# Patient Record
Sex: Male | Born: 1935 | Race: White | Hispanic: No | Marital: Married | Smoking: Never smoker
Health system: Southern US, Community
[De-identification: ages and names within clinical notes are randomized; demographics above are authoritative.]

## PROBLEM LIST (undated history)

## (undated) DIAGNOSIS — I2510 Atherosclerotic heart disease of native coronary artery without angina pectoris: Secondary | ICD-10-CM

## (undated) DIAGNOSIS — N184 Chronic kidney disease, stage 4 (severe): Secondary | ICD-10-CM

## (undated) DIAGNOSIS — J9611 Chronic respiratory failure with hypoxia: Secondary | ICD-10-CM

## (undated) DIAGNOSIS — E039 Hypothyroidism, unspecified: Secondary | ICD-10-CM

## (undated) DIAGNOSIS — M1990 Unspecified osteoarthritis, unspecified site: Secondary | ICD-10-CM

## (undated) DIAGNOSIS — L409 Psoriasis, unspecified: Secondary | ICD-10-CM

## (undated) DIAGNOSIS — I739 Peripheral vascular disease, unspecified: Secondary | ICD-10-CM

## (undated) DIAGNOSIS — K219 Gastro-esophageal reflux disease without esophagitis: Secondary | ICD-10-CM

## (undated) DIAGNOSIS — R001 Bradycardia, unspecified: Secondary | ICD-10-CM

## (undated) DIAGNOSIS — I779 Disorder of arteries and arterioles, unspecified: Secondary | ICD-10-CM

## (undated) DIAGNOSIS — B029 Zoster without complications: Secondary | ICD-10-CM

## (undated) DIAGNOSIS — I4891 Unspecified atrial fibrillation: Secondary | ICD-10-CM

## (undated) DIAGNOSIS — R531 Weakness: Secondary | ICD-10-CM

## (undated) DIAGNOSIS — Z9981 Dependence on supplemental oxygen: Secondary | ICD-10-CM

## (undated) DIAGNOSIS — I5032 Chronic diastolic (congestive) heart failure: Secondary | ICD-10-CM

## (undated) DIAGNOSIS — R011 Cardiac murmur, unspecified: Secondary | ICD-10-CM

## (undated) DIAGNOSIS — R0601 Orthopnea: Secondary | ICD-10-CM

## (undated) DIAGNOSIS — F0390 Unspecified dementia without behavioral disturbance: Secondary | ICD-10-CM

## (undated) DIAGNOSIS — I10 Essential (primary) hypertension: Secondary | ICD-10-CM

## (undated) DIAGNOSIS — E785 Hyperlipidemia, unspecified: Secondary | ICD-10-CM

## (undated) DIAGNOSIS — E119 Type 2 diabetes mellitus without complications: Secondary | ICD-10-CM

## (undated) HISTORY — DX: Essential (primary) hypertension: I10

## (undated) HISTORY — DX: Atherosclerotic heart disease of native coronary artery without angina pectoris: I25.10

## (undated) HISTORY — DX: Type 2 diabetes mellitus without complications: E11.9

## (undated) HISTORY — DX: Unspecified osteoarthritis, unspecified site: M19.90

## (undated) HISTORY — PX: LUNG REMOVAL, PARTIAL: SHX233

## (undated) HISTORY — PX: CAROTID ENDARTERECTOMY: SUR193

## (undated) HISTORY — DX: Orthopnea: R06.01

## (undated) HISTORY — PX: BLEPHAROPLASTY: SUR158

## (undated) HISTORY — PX: CARDIAC CATHETERIZATION: SHX172

## (undated) HISTORY — DX: Psoriasis, unspecified: L40.9

## (undated) HISTORY — DX: Chronic kidney disease, stage 4 (severe): N18.4

## (undated) HISTORY — DX: Chronic diastolic (congestive) heart failure: I50.32

## (undated) HISTORY — DX: Chronic respiratory failure with hypoxia: J96.11

## (undated) HISTORY — DX: Unspecified atrial fibrillation: I48.91

## (undated) HISTORY — PX: SHOULDER SURGERY: SHX246

## (undated) HISTORY — PX: THORACOTOMY: SHX5074

## (undated) HISTORY — PX: CORONARY ARTERY BYPASS GRAFT: SHX141

## (undated) HISTORY — PX: APPENDECTOMY: SHX54

## (undated) HISTORY — DX: Bradycardia, unspecified: R00.1

## (undated) HISTORY — DX: Disorder of arteries and arterioles, unspecified: I77.9

## (undated) HISTORY — DX: Weakness: R53.1

## (undated) HISTORY — PX: FRACTURE SURGERY: SHX138

## (undated) HISTORY — DX: Hyperlipidemia, unspecified: E78.5

## (undated) HISTORY — DX: Hypothyroidism, unspecified: E03.9

## (undated) HISTORY — DX: Peripheral vascular disease, unspecified: I73.9

## (undated) HISTORY — DX: Zoster without complications: B02.9

---

## 1998-11-26 ENCOUNTER — Ambulatory Visit (HOSPITAL_COMMUNITY): Admission: RE | Admit: 1998-11-26 | Discharge: 1998-11-26 | Payer: Self-pay | Admitting: Vascular Surgery

## 1998-11-26 ENCOUNTER — Encounter: Payer: Self-pay | Admitting: Vascular Surgery

## 1998-11-30 ENCOUNTER — Encounter: Payer: Self-pay | Admitting: Vascular Surgery

## 1998-12-01 ENCOUNTER — Inpatient Hospital Stay: Admission: RE | Admit: 1998-12-01 | Discharge: 1998-12-02 | Payer: Self-pay | Admitting: Vascular Surgery

## 1999-11-22 ENCOUNTER — Encounter: Payer: Self-pay | Admitting: Gastroenterology

## 1999-11-22 ENCOUNTER — Encounter: Admission: RE | Admit: 1999-11-22 | Discharge: 1999-11-22 | Payer: Self-pay | Admitting: Gastroenterology

## 1999-12-05 ENCOUNTER — Ambulatory Visit (HOSPITAL_COMMUNITY): Admission: RE | Admit: 1999-12-05 | Discharge: 1999-12-05 | Payer: Self-pay | Admitting: Gastroenterology

## 1999-12-05 ENCOUNTER — Encounter: Payer: Self-pay | Admitting: Gastroenterology

## 1999-12-22 ENCOUNTER — Encounter: Payer: Self-pay | Admitting: Vascular Surgery

## 1999-12-23 ENCOUNTER — Encounter: Payer: Self-pay | Admitting: Vascular Surgery

## 1999-12-23 ENCOUNTER — Ambulatory Visit (HOSPITAL_COMMUNITY): Admission: RE | Admit: 1999-12-23 | Discharge: 1999-12-23 | Payer: Self-pay | Admitting: Vascular Surgery

## 1999-12-25 HISTORY — PX: LUMBAR LAMINECTOMY: SHX95

## 1999-12-30 ENCOUNTER — Inpatient Hospital Stay: Admission: RE | Admit: 1999-12-30 | Discharge: 1999-12-31 | Payer: Self-pay | Admitting: Vascular Surgery

## 2000-12-06 ENCOUNTER — Emergency Department (HOSPITAL_COMMUNITY): Admission: EM | Admit: 2000-12-06 | Discharge: 2000-12-06 | Payer: Self-pay | Admitting: Emergency Medicine

## 2001-01-09 ENCOUNTER — Inpatient Hospital Stay (HOSPITAL_COMMUNITY): Admission: EM | Admit: 2001-01-09 | Discharge: 2001-01-12 | Payer: Self-pay | Admitting: Emergency Medicine

## 2001-01-09 ENCOUNTER — Encounter: Payer: Self-pay | Admitting: Emergency Medicine

## 2001-01-25 ENCOUNTER — Encounter: Payer: Self-pay | Admitting: Cardiothoracic Surgery

## 2001-01-29 ENCOUNTER — Encounter: Payer: Self-pay | Admitting: Cardiothoracic Surgery

## 2001-01-29 ENCOUNTER — Encounter (INDEPENDENT_AMBULATORY_CARE_PROVIDER_SITE_OTHER): Payer: Self-pay | Admitting: *Deleted

## 2001-01-29 ENCOUNTER — Inpatient Hospital Stay (HOSPITAL_COMMUNITY): Admission: RE | Admit: 2001-01-29 | Discharge: 2001-02-04 | Payer: Self-pay | Admitting: Cardiothoracic Surgery

## 2001-01-30 ENCOUNTER — Encounter: Payer: Self-pay | Admitting: Cardiothoracic Surgery

## 2001-01-31 ENCOUNTER — Encounter: Payer: Self-pay | Admitting: Cardiothoracic Surgery

## 2001-02-01 ENCOUNTER — Encounter: Payer: Self-pay | Admitting: Cardiothoracic Surgery

## 2001-05-07 ENCOUNTER — Inpatient Hospital Stay (HOSPITAL_COMMUNITY): Admission: EM | Admit: 2001-05-07 | Discharge: 2001-05-08 | Payer: Self-pay

## 2001-05-07 ENCOUNTER — Ambulatory Visit (HOSPITAL_COMMUNITY): Admission: RE | Admit: 2001-05-07 | Discharge: 2001-05-07 | Payer: Self-pay | Admitting: Cardiology

## 2001-05-07 ENCOUNTER — Encounter: Payer: Self-pay | Admitting: Cardiology

## 2001-10-07 ENCOUNTER — Ambulatory Visit (HOSPITAL_COMMUNITY): Admission: RE | Admit: 2001-10-07 | Discharge: 2001-10-07 | Payer: Self-pay | Admitting: Gastroenterology

## 2002-12-08 ENCOUNTER — Encounter (HOSPITAL_BASED_OUTPATIENT_CLINIC_OR_DEPARTMENT_OTHER): Payer: Self-pay | Admitting: Internal Medicine

## 2002-12-08 ENCOUNTER — Encounter: Admission: RE | Admit: 2002-12-08 | Discharge: 2002-12-08 | Payer: Self-pay | Admitting: Internal Medicine

## 2002-12-14 ENCOUNTER — Encounter: Admission: RE | Admit: 2002-12-14 | Discharge: 2002-12-14 | Payer: Self-pay | Admitting: Internal Medicine

## 2002-12-14 ENCOUNTER — Encounter (HOSPITAL_BASED_OUTPATIENT_CLINIC_OR_DEPARTMENT_OTHER): Payer: Self-pay | Admitting: Internal Medicine

## 2004-11-10 ENCOUNTER — Ambulatory Visit (HOSPITAL_COMMUNITY): Admission: RE | Admit: 2004-11-10 | Discharge: 2004-11-10 | Payer: Self-pay | Admitting: Gastroenterology

## 2004-12-16 ENCOUNTER — Ambulatory Visit (HOSPITAL_COMMUNITY): Admission: RE | Admit: 2004-12-16 | Discharge: 2004-12-16 | Payer: Self-pay | Admitting: Cardiovascular Disease

## 2005-06-30 ENCOUNTER — Encounter: Admission: RE | Admit: 2005-06-30 | Discharge: 2005-06-30 | Payer: Self-pay | Admitting: Gastroenterology

## 2005-07-14 ENCOUNTER — Encounter: Admission: RE | Admit: 2005-07-14 | Discharge: 2005-07-14 | Payer: Self-pay | Admitting: Gastroenterology

## 2006-06-26 HISTORY — PX: CARDIAC CATHETERIZATION: SHX172

## 2006-09-12 ENCOUNTER — Ambulatory Visit: Payer: Self-pay | Admitting: Vascular Surgery

## 2007-06-05 ENCOUNTER — Inpatient Hospital Stay (HOSPITAL_BASED_OUTPATIENT_CLINIC_OR_DEPARTMENT_OTHER): Admission: RE | Admit: 2007-06-05 | Discharge: 2007-06-05 | Payer: Self-pay | Admitting: Cardiovascular Disease

## 2007-06-17 ENCOUNTER — Encounter: Payer: Self-pay | Admitting: Pulmonary Disease

## 2007-06-17 DIAGNOSIS — E1151 Type 2 diabetes mellitus with diabetic peripheral angiopathy without gangrene: Secondary | ICD-10-CM

## 2007-06-19 ENCOUNTER — Ambulatory Visit: Payer: Self-pay | Admitting: Pulmonary Disease

## 2007-06-19 DIAGNOSIS — I2510 Atherosclerotic heart disease of native coronary artery without angina pectoris: Secondary | ICD-10-CM | POA: Insufficient documentation

## 2007-08-05 ENCOUNTER — Ambulatory Visit (HOSPITAL_BASED_OUTPATIENT_CLINIC_OR_DEPARTMENT_OTHER): Admission: RE | Admit: 2007-08-05 | Discharge: 2007-08-05 | Payer: Self-pay | Admitting: Urology

## 2007-08-05 ENCOUNTER — Encounter (INDEPENDENT_AMBULATORY_CARE_PROVIDER_SITE_OTHER): Payer: Self-pay | Admitting: Urology

## 2007-09-10 ENCOUNTER — Ambulatory Visit: Payer: Self-pay | Admitting: Vascular Surgery

## 2007-12-26 ENCOUNTER — Emergency Department (HOSPITAL_COMMUNITY): Admission: EM | Admit: 2007-12-26 | Discharge: 2007-12-26 | Payer: Self-pay | Admitting: Emergency Medicine

## 2008-01-05 ENCOUNTER — Observation Stay (HOSPITAL_COMMUNITY): Admission: EM | Admit: 2008-01-05 | Discharge: 2008-01-05 | Payer: Self-pay | Admitting: Emergency Medicine

## 2008-01-07 ENCOUNTER — Telehealth: Payer: Self-pay | Admitting: Gastroenterology

## 2008-01-31 ENCOUNTER — Emergency Department (HOSPITAL_COMMUNITY): Admission: EM | Admit: 2008-01-31 | Discharge: 2008-01-31 | Payer: Self-pay | Admitting: Emergency Medicine

## 2008-02-10 ENCOUNTER — Ambulatory Visit (HOSPITAL_COMMUNITY): Admission: RE | Admit: 2008-02-10 | Discharge: 2008-02-10 | Payer: Self-pay | Admitting: Internal Medicine

## 2008-09-08 ENCOUNTER — Ambulatory Visit: Payer: Self-pay | Admitting: Vascular Surgery

## 2009-03-11 ENCOUNTER — Ambulatory Visit: Payer: Self-pay | Admitting: Vascular Surgery

## 2009-10-21 ENCOUNTER — Ambulatory Visit: Payer: Self-pay | Admitting: Vascular Surgery

## 2010-06-01 ENCOUNTER — Ambulatory Visit: Payer: Self-pay | Admitting: Vascular Surgery

## 2010-06-30 ENCOUNTER — Ambulatory Visit: Payer: Self-pay | Admitting: Cardiovascular Disease

## 2010-09-20 ENCOUNTER — Ambulatory Visit (INDEPENDENT_AMBULATORY_CARE_PROVIDER_SITE_OTHER): Payer: Medicare Other | Admitting: Cardiovascular Disease

## 2010-09-20 ENCOUNTER — Encounter: Payer: Self-pay | Admitting: Cardiovascular Disease

## 2010-09-20 ENCOUNTER — Telehealth: Payer: Self-pay | Admitting: *Deleted

## 2010-09-20 DIAGNOSIS — E0590 Thyrotoxicosis, unspecified without thyrotoxic crisis or storm: Secondary | ICD-10-CM

## 2010-09-20 DIAGNOSIS — I2589 Other forms of chronic ischemic heart disease: Secondary | ICD-10-CM

## 2010-09-20 DIAGNOSIS — I10 Essential (primary) hypertension: Secondary | ICD-10-CM

## 2010-09-20 DIAGNOSIS — I2510 Atherosclerotic heart disease of native coronary artery without angina pectoris: Secondary | ICD-10-CM

## 2010-09-20 DIAGNOSIS — R079 Chest pain, unspecified: Secondary | ICD-10-CM

## 2010-09-20 DIAGNOSIS — K219 Gastro-esophageal reflux disease without esophagitis: Secondary | ICD-10-CM

## 2010-09-20 DIAGNOSIS — E785 Hyperlipidemia, unspecified: Secondary | ICD-10-CM

## 2010-09-20 DIAGNOSIS — E119 Type 2 diabetes mellitus without complications: Secondary | ICD-10-CM

## 2010-09-20 DIAGNOSIS — E039 Hypothyroidism, unspecified: Secondary | ICD-10-CM

## 2010-09-20 MED ORDER — ISOSORBIDE DINITRATE 20 MG PO TABS: 20.0000 mg | ORAL_TABLET | Freq: Three times a day (TID) | ORAL | Status: DC

## 2010-09-20 MED ORDER — RANOLAZINE ER 1000 MG PO TB12: 500.0000 mg | ORAL_TABLET | Freq: Two times a day (BID) | ORAL | Status: DC

## 2010-09-20 MED ORDER — NITROGLYCERIN 0.4 MG SL SUBL: 0.4000 mg | SUBLINGUAL_TABLET | SUBLINGUAL | Status: DC | PRN

## 2010-09-20 NOTE — Telephone Encounter (Signed)
Pts wife calling-she wanted to verify the dose for Isosorbid 20mg  TID;  Pt and spouse were  instructed

## 2010-09-20 NOTE — Progress Notes (Signed)
History of Present Illness: [name] is an elderly gentleman with a history of coronary artery disease and coronary artery bypass grafting. He also has chronic renal insufficiency. His last heart catheterization in 2008 revealed severe native coronary artery disease.  He is well on medical therapy to recently.  He now presents with several weeks of progressive shortness of breath With exertion as well as chest pain. He also has had some weight loss. He has not taken any nitroglycerin despite having angina. When I asked him, he did admit to having Nitrolingual spray but just forgot to use it. He denies any syncope or presyncope. He denies any PND or orthopnea.  Current Outpatient Prescriptions  Medication Sig Dispense Refill  . allopurinol (ZYLOPRIM) 300 MG tablet Take 1 tablet (300 mg total) by mouth daily.  30 tablet  11  . amLODipine-benazepril (LOTREL) 10-20 MG per capsule Take 1 capsule by mouth daily.  30 capsule  11  . aspirin 325 MG tablet Take 1 tablet (325 mg total) by mouth daily.  30 tablet  11  . cloNIDine (CATAPRES) 0.2 MG tablet Take 1 tablet (0.2 mg total) by mouth 2 (two) times daily.  60 tablet  11  . clopidogrel (PLAVIX) 75 MG tablet Take 1 tablet (75 mg total) by mouth daily.  30 tablet  11  . hydrochlorothiazide (HYDRODIURIL) 12.5 MG tablet Take 1 tablet (12.5 mg total) by mouth daily.  30 tablet  11  . insulin aspart (NOVOLOG) 100 UNIT/ML injection Use as directed  10 mL  12  . insulin glargine (LANTUS) 100 UNIT/ML injection Use as directed   10 mL  12  . levothyroxine (SYNTHROID) 150 MCG tablet Take 1 tablet (150 mcg total) by mouth daily.  30 tablet  11  . metFORMIN (GLUCOPHAGE) 850 MG tablet Take 1 tablet (850 mg total) by mouth 2 (two) times daily with a meal.  60 tablet  11  . Multiple Vitamins-Minerals (MULTIVITAL PLATINUM) TABS One po daily   30 each  0  . ranitidine (ZANTAC) 300 MG tablet Take 1 tablet (300 mg total) by mouth at bedtime.  30 tablet  11  .  rosuvastatin (CRESTOR) 20 MG tablet Take 1 tablet (20 mg total) by mouth daily.  30 tablet  11  . DISCONTD: ranolazine (RANEXA) 500 MG 12 hr tablet Take 1 tablet (500 mg total) by mouth 2 (two) times daily.  60 tablet  11  . isosorbide dinitrate (ISORDIL) 20 MG tablet Take 1 tablet (20 mg total) by mouth 3 (three) times daily.  90 tablet  11  . nitroGLYCERIN (NITROSTAT) 0.4 MG SL tablet Place 1 tablet (0.4 mg total) under the tongue every 5 (five) minutes as needed for chest pain.  90 tablet  12  . ranolazine (RANEXA) 1000 MG SR tablet Take 1 tablet (1,000 mg total) by mouth 2 (two) times daily.  60 tablet  11     Allergies  Allergen Reactions  . Iodinated Diagnostic Agents Rash    Does fine with premedications for cath    Past Medical History  Diagnosis Date  . Hypertension   . Hyperlipidemia   . Thyroid disease   . CAD (coronary artery disease)     Past Surgical History  Procedure Date  . Coronary artery bypass graft   . Cardiac catheterization     History  Smoking status  . Never Smoker   Smokeless tobacco  . Not on file    History  Alcohol Use No  Family History  Problem Relation Age of Onset  . Heart attack Father     Review of Systems: The patient denies any heat or cold intolerance. + weight loss.  The patient denies headaches or blurry vision.  There is no cough or sputum production.  The patient denies dizziness.  There is no hematuria or hematochezia.  He has abdominal pain after eating.  The patient denies any muscle aches or arthritis.  The patient denies any rash.  The patient denies frequent falling or instability.  There is no history of depression or anxiety.  All other systems were reviewed and are negative.  Physical Exam: BP 136/54  Pulse 72  Wt 178 lb (80.74 kg) The patient is alert and oriented x 3.  The mood and affect are normal.  The skin is warm and dry.  Color is normal.  The HEENT exam reveals that the sclera are nonicteric.  The mucous  membranes are moist.  He has bilateral endarectomy scars.  The carotids are 1+ on the right, trace on the left with bilateral bruits.  There is no thyromegaly.  There is no JVD.  The lungs are clear.  The chest wall is non tender.  The heart exam reveals a regular rate with a normal S1 and S2.  There is a 2/6 systolic murmur.  The PMI is not displaced.   Abdominal exam reveals good bowel sounds.  There is no guarding or rebound.  There is no hepatosplenomegaly or tenderness.  There are no masses.  Exam of the legs reveal no clubbing, cyanosis, or edema.  The legs are without rashes.  The distal pulses are intact.  Cranial nerves II - XII are intact.  Motor and sensory functions are intact.  The gait is normal.   Review of labs from Dr. Norval Gable office reveals a TSH of 0.11. His free T4 is 1.6. His glucose is 217. His BUN is 36. His creatinine is 2.2. CT Scan of the abdomen and pelvis reveals no specific abdominal mass.  Assessment / Plan:

## 2010-09-20 NOTE — Assessment & Plan Note (Signed)
His labs suggest that he may be slightly over replaced in terms of his symptoms. I'll have him cut his one half and wait to hear from Dr. Eloise Harman regarding further instructions.

## 2010-09-20 NOTE — Assessment & Plan Note (Addendum)
Mr. Muscarella has severe coronary artery disease and has residual stenosis that certainly could cause angina. He has done fairly well on medical therapy but now admits that he has not been taking his Ranexa as him should be.  It appears that he is slightly over replaced with his Synthroid. This certainly could be the cause of his chest pain and shortness breath as well as his weight loss. I've instructed him to cut his current Synthroid 2 one half tablet a day until he hears back from Dr. Jarold Motto.  My hope is that his chest pain will resolve once his hyperthyroidism is corrected. He has known severe coronary artery disease and also has known moderate chronic kidney disease. He has significant risks for worsening renal failure if we decide to pursue heart catheterization. Labs that we can continue with medical therapy and not have to resort heart catheterization. We will have him start on isosorbide dinitrate 20 mg 3 times a day. I've refilled his prescription for nitroglycerin. He's currently on 5 mg of Ranexa twice a day. This is probably the maximal dose that he'll tolerate since he has Renal insufficiency.

## 2010-09-20 NOTE — Telephone Encounter (Signed)
Message copied by Erskine Squibb on Tue Sep 20, 2010  1:44 PM ------      Message from: Sheffield Slider      Created: Tue Sep 20, 2010 12:36 PM      Regarding: QUESTION/NAHSER      Contact: [phone]       PER WIFE, PT WAS HERE THIS MORNING, THERE WAS  A MED CALLED INTO THE PHARMACY THAT PT WAS NOT AWARE THAT HE WOULD BE TAKING. THEY WANT TO SPEAK WITH SOMEONE ABOUT IT. CHART SHOULD BE IN THE BACK.  1237PM

## 2010-09-29 ENCOUNTER — Other Ambulatory Visit: Payer: Self-pay | Admitting: Gastroenterology

## 2010-10-17 ENCOUNTER — Telehealth: Payer: Self-pay | Admitting: Cardiovascular Disease

## 2010-10-17 MED ORDER — ISOSORBIDE DINITRATE 20 MG PO TABS: 20.0000 mg | ORAL_TABLET | Freq: Three times a day (TID) | ORAL | Status: DC

## 2010-10-17 NOTE — Telephone Encounter (Signed)
Patient request refill.  90 day refill done.Alfonso Ramus RN

## 2010-10-17 NOTE — Telephone Encounter (Signed)
[name], wife, has a question regarding his meds.

## 2010-10-21 ENCOUNTER — Other Ambulatory Visit: Payer: Self-pay | Admitting: *Deleted

## 2010-10-21 MED ORDER — ISOSORBIDE DINITRATE 20 MG PO TABS: 20.0000 mg | ORAL_TABLET | Freq: Three times a day (TID) | ORAL | Status: DC

## 2010-10-21 NOTE — Telephone Encounter (Signed)
escribe medication per fax request  

## 2010-10-25 ENCOUNTER — Ambulatory Visit: Payer: PRIVATE HEALTH INSURANCE | Admitting: Cardiovascular Disease

## 2010-10-25 ENCOUNTER — Encounter: Payer: Self-pay | Admitting: Cardiovascular Disease

## 2010-10-25 ENCOUNTER — Ambulatory Visit (INDEPENDENT_AMBULATORY_CARE_PROVIDER_SITE_OTHER): Payer: Medicare Other | Admitting: Cardiovascular Disease

## 2010-10-25 VITALS — BP 140/70 | HR 72 | Wt 180.0 lb

## 2010-10-25 DIAGNOSIS — I2589 Other forms of chronic ischemic heart disease: Secondary | ICD-10-CM

## 2010-10-25 MED ORDER — ISOSORBIDE MONONITRATE 60 MG PO TB24: 60.0000 mg | ORAL_TABLET | Freq: Every day | ORAL | Status: DC

## 2010-10-25 NOTE — Assessment & Plan Note (Signed)
[name] has severe native coronary artery disease but seems to be doing fairly well on his current dose of isosorbide. We have rewritten his prescription for Imdur 60 mg tablets. He is not having significant chest pains. We'll continue with the same medication. I'll see him again in 6 months.

## 2010-10-25 NOTE — Progress Notes (Signed)
[name] Date of Birth  02-Jun-1934 [facility] / [facility] [address]     [address] [place], [place]  [address] [phone]  Fax  [phone]  History of Present Illness:  [name] is an elderly gentleman with a history of coronary artery disease. He had a heart catheterization in 2008 which revealed severe native coronary disease.  He had a patent graft to the LAD, first diagonal, and right coronary artery. The graft to the left circumflex artery has a tight stenosis but the circumflex vessel was very small and did not supply much myocardium. It Was not felt that that was a good candidate for revascularization.  Current Outpatient Prescriptions on File Prior to Visit  Medication Sig Dispense Refill  . allopurinol (ZYLOPRIM) 300 MG tablet Take 1 tablet (300 mg total) by mouth daily.  30 tablet  11  . amLODipine-benazepril (LOTREL) 10-20 MG per capsule Take 1 capsule by mouth daily.  30 capsule  11  . aspirin 325 MG tablet Take 1 tablet (325 mg total) by mouth daily.  30 tablet  11  . cloNIDine (CATAPRES) 0.2 MG tablet Take 1 tablet (0.2 mg total) by mouth 2 (two) times daily.  60 tablet  11  . clopidogrel (PLAVIX) 75 MG tablet Take 1 tablet (75 mg total) by mouth daily.  30 tablet  11  . hydrochlorothiazide (HYDRODIURIL) 12.5 MG tablet Take 1 tablet (12.5 mg total) by mouth daily.  30 tablet  11  . insulin aspart (NOVOLOG) 100 UNIT/ML injection Use as directed  10 mL  12  . insulin glargine (LANTUS) 100 UNIT/ML injection Use as directed   10 mL  12  . isosorbide dinitrate (ISORDIL) 20 MG tablet Take 1 tablet (20 mg total) by mouth 3 (three) times daily.  540 tablet  11  . levothyroxine (SYNTHROID) 150 MCG tablet Take 1 tablet (150 mcg total) by mouth daily.  30 tablet  11  . metFORMIN (GLUCOPHAGE) 850 MG tablet Take 1 tablet (850 mg total) by mouth 2 (two) times daily with a meal.  60 tablet  11  . Multiple Vitamins-Minerals (MULTIVITAL PLATINUM) TABS  One po daily   30 each  0  . nitroGLYCERIN (NITROSTAT) 0.4 MG SL tablet Place 1 tablet (0.4 mg total) under the tongue every 5 (five) minutes as needed for chest pain.  90 tablet  12  . ranitidine (ZANTAC) 300 MG tablet Take 1 tablet (300 mg total) by mouth at bedtime.  30 tablet  11  . ranolazine (RANEXA) 1000 MG SR tablet Take 1 tablet (1,000 mg total) by mouth 2 (two) times daily.  60 tablet  11  . rosuvastatin (CRESTOR) 20 MG tablet Take 1 tablet (20 mg total) by mouth daily.  30 tablet  11    Allergies  Allergen Reactions  . Iodinated Diagnostic Agents Rash    Does fine with premedications for cath    Past Medical History  Diagnosis Date  . Hypertension   . Hyperlipidemia   . Thyroid disease   . CAD (coronary artery disease)     Past Surgical History  Procedure Date  . Coronary artery bypass graft   . Cardiac catheterization     History  Smoking status  . Never Smoker   Smokeless tobacco  . Not on file    History  Alcohol Use No    Family History  Problem Relation Age of Onset  . Heart attack Father     Reviw of  Systems:  Reviewed in the HPI.  All other systems are negative.  Physical Exam: BP 140/70  Pulse 72  Wt 180 lb (81.647 kg) The patient is alert and oriented x 3.  The mood and affect are normal.  The skin is warm and dry.  Color is normal.  The HEENT exam reveals that the sclera are nonicteric.  The mucous membranes are moist.  The carotids are 2+ without bruits.  He has bilateral CEA scars.  There is no thyromegaly.  There is no JVD.  The lungs reveal rhonchi in the right base.  The chest wall is non tender.  The heart exam reveals a regular rate with a normal S1 and S2.  There is a 2/6 SEM.  The PMI is not displaced.   Abdominal exam reveals good bowel sounds.  There is no guarding or rebound.  There is no hepatosplenomegaly or tenderness.  There are no masses.  Exam of the legs reveal no clubbing, cyanosis, or edema.  The legs are without rashes.   The distal pulses are intact.  Cranial nerves II - XII are intact.  Motor and sensory functions are intact.  The gait is normal.  Assessment / Plan:

## 2010-11-07 ENCOUNTER — Other Ambulatory Visit: Payer: Self-pay | Admitting: Gastroenterology

## 2010-11-07 ENCOUNTER — Telehealth: Payer: Self-pay | Admitting: Cardiovascular Disease

## 2010-11-07 DIAGNOSIS — I2510 Atherosclerotic heart disease of native coronary artery without angina pectoris: Secondary | ICD-10-CM

## 2010-11-07 MED ORDER — RANOLAZINE ER 500 MG PO TB12: 500.0000 mg | ORAL_TABLET | Freq: Two times a day (BID) | ORAL | Status: DC

## 2010-11-07 MED ORDER — RANOLAZINE ER 1000 MG PO TB12: 500.0000 mg | ORAL_TABLET | Freq: Two times a day (BID) | ORAL | Status: DC

## 2010-11-07 NOTE — Telephone Encounter (Signed)
Called left msg to call me bck. Alfonso Ramus RN

## 2010-11-07 NOTE — Telephone Encounter (Signed)
Wanted 90 day supply, order dispense changed.Alfonso Ramus RN

## 2010-11-07 NOTE — Telephone Encounter (Signed)
NEEDS TO TALK WITH YOU ABOUT MEDS  YOU JUST CALLED INTO [facility]

## 2010-11-07 NOTE — Telephone Encounter (Signed)
CALL ABOUT RENEXA.

## 2010-11-07 NOTE — Telephone Encounter (Signed)
Is there any other med comparable to the ranexa, it is 300. A month. Alfonso Ramus RN

## 2010-11-08 ENCOUNTER — Telehealth: Payer: Self-pay | Admitting: *Deleted

## 2010-11-08 NOTE — Cardiovascular Report (Signed)
NAMEZACHRY, HOPFENSPERGER NO.:  [id number]   MEDICAL RECORD NO.:  [id number]          PATIENT TYPE:  OIB   LOCATION:  1962                         FACILITY:  [facility]   PHYSICIAN:  Vesta Mixer, M.D. DATE OF BIRTH:  1933/12/17   DATE OF PROCEDURE:  06/05/2007  DATE OF DISCHARGE:                            CARDIAC CATHETERIZATION   [name] is a 77 year old gentleman with a history of coronary  artery disease.  He is status post coronary artery bypass grafting.  He  presents with some episodes of angina-like chest pain.  He is referred  for heart catheterization.   PROCEDURE:  Left heart catheterization with coronary angiography.   The right femoral artery was easily cannulated using a modified  Seldinger technique.   HEMODYNAMICS:  LV pressure is 150/60 with an aortic pressure of 152/66.   ANGIOGRAPHY:  Left main:  The left main has minor coronary artery  irregularities.   The left anterior descending artery is a large vessel.  There is an 80%  stenosis right at the first septal perforator.  The insertion of the IMA  graft can be seen right at this point.  The flow down the LAD is still  fairly good, and there are only minor luminal irregularities in the mid  and distal LAD.   The left circumflex artery is occluded.   The right coronary artery has a mid 40% stenosis followed by a 70%  stenosis.  Competitive flow from the saphenous vein graft can be seen.   The saphenous vein graft to the right coronary artery is a normal graft.  The anastomosis to the native right is normal, and the posterior  descending artery and posterolateral segment artery are normal.   The saphenous vein graft to the obtuse marginal artery is a very tiny  graft.  There is a 90% stenosis in the mid portion of the graft, and the  obtuse marginal system is a very tiny system with virtually no flow out  to the lateral wall.   The saphenous vein graft to the diagonal artery is a  nice graft.  The  anastomosis to the diagonal is normal and there is no significant  irregularity to the diagonal.   The left internal mammary artery is a small- to moderate-sized IMA.  The  anastomosis to the LAD is normal.  There is brisk flow down the LAD.  Competitive flow can be seen from the native LAD.   The left ventriculogram was performed via hand injection.  It reveals  overall normal left ventricular systolic function.   COMPLICATIONS:  None.   CONCLUSION:  Severe native coronary artery disease but with patent  grafts to the LAD, first diagonal artery and right coronary artery.  The  graft to the circumflex vessel has a tight stenosis, but the circumflex  distribution is a very small vessel and really does not supply much in  the way of myocardium.  This is unchanged from his previous cath.  We  will continue with medical therapy.  There are really no sites for  intervention.  ______________________________  Vesta Mixer, M.D.     PJN/MEDQ  D:  06/05/2007  T:  06/05/2007  Job:  [id number]

## 2010-11-08 NOTE — H&P (Signed)
NAMENEHEMYAH, FOUSHEE               ACCOUNT NO.:  [id number]   MEDICAL RECORD NO.:  [id number]            PATIENT TYPE:   LOCATION:                                 FACILITY:   PHYSICIAN:  Vesta Mixer, M.D.      DATE OF BIRTH:   DATE OF ADMISSION:  06/05/2007  DATE OF DISCHARGE:                              HISTORY & PHYSICAL   [name] is a 77 year old gentleman with a history of coronary  artery disease.  He is status post coronary artery bypass grafting x2.  He also has a history of peripheral vascular disease, hypothyroidism,  diabetes mellitus and hyperlipidemia.  He is admitted for a heart  catheterization after having multiple episodes of chest pain.   [name] has a history of coronary artery disease.  His last  catheterization was in 2006.  It revealed a patent LIMA to the LAD  circulation.  The saphenous vein graft to the diagonal artery was normal  and filled the LAD in a retrograde fashion.  The saphenous vein graft to  the second obtuse marginal artery was very small and there was a 90% mid  graft stenosis.  There was a first diagonal artery that was between 2  and 2.25 mm in diameter.  There was a 70-80% stenosis in the mid segment  of that lesion.   We treated him medically because of the relatively small size.  He now  presents with several months of progressive chest pain and shortness of  breath.  He becomes very short of breath walking as little as 50 feet.  He denies any syncope or presyncope, PND or orthopnea.   CURRENT MEDICATIONS:  1. Metformin HCl 850 mg p.o. b.i.d.  2. Amlodipine. benazepril 10/20 mg once a day.  3. Crestor 20 mg a day.  4. Hydrochlorothiazide 25 mg a day.  5. Thyroxine 0.15 mg a day.  6. Omeprazole 20 mg twice a day.  7. Allopurinol 300 mg twice a day.  8. Plavix 75 mg a day.  9. Ranexa 1000 mg twice a day.  10.Clonidine 0.2 mg a day.  11.Centrum Silver once a day.  12.Aspirin 325 mg a day.  13.NovoLog 10 units twice a  day.   EXAM:  He is an elderly gentleman in no acute distress.  He is alert and  oriented x3, and his mood and affect are normal.  His weight is 205, his blood pressure is 140/70 with heart rate of 64.  HEENT:  2+ carotids.  He has no bruits, no JVD, no thyromegaly.  LUNGS:  Clear to auscultation.  HEART:  Regular rate, S1, S2.  ABDOMEN:  Good bowel sounds, nontender.  EXTREMITIES:  There is no clubbing, cyanosis or edema.  NEUROLOGIC:  Nonfocal.   His laboratory data is pending.   [name] presents with episodes of chest discomfort.  He has known  diffuse coronary artery disease.  He has a stenosis in the second  diagonal artery.  We now have a 2.25-mm stent that may be useful in this  artery that we did not have several  years ago.  We will bring him back  for a diagnostic heart catheterization.  We have discussed the risks,  benefits and options of heart catheterization.  He understands and  agrees to proceed.           ______________________________  Vesta Mixer, M.D.     PJN/MEDQ  D:  05/31/2007  T:  06/01/2007  Job:  [id number]   cc:   Barry Dienes. Eloise Harman, M.D.

## 2010-11-08 NOTE — Telephone Encounter (Signed)
Hold renexa Peter Ramus RN

## 2010-11-08 NOTE — Telephone Encounter (Signed)
msg left that rnexa doesn't come in a generic and dr Elease Hashimoto said he could stop it but if he has anginal pain he needs to go back on it. Pt to call me back to update me with a decision. Alfonso Ramus RN

## 2010-11-08 NOTE — H&P (Signed)
[name], [name]               ACCOUNT NO.:  [id number]   MEDICAL RECORD NO.:  [id number]          PATIENT TYPE:  OBV   LOCATION:  5125                         FACILITY:  [facility]   PHYSICIAN:  Tera Mater. [place], M.D. DATE OF BIRTH:  [birth year]   DATE OF ADMISSION:  01/04/2008  DATE OF DISCHARGE:                              HISTORY & PHYSICAL   HISTORY OF PRESENT ILLNESS:  Mr. Chiriboga is a 77 year old white male with  a past history of coronary disease, carotid disease, peripheral vascular  disease, thyroid disease, and type 2 diabetes who presented for  evaluation.  He is out eating [place] food earlier in the evening about  8 p.m. had a burning in his epigastrium right upper quadrant and in his  lower chest.  Extensive and full evaluation in the emergency room was  negative for any cardiac or other issues.  The patient; however, was  too weak according to family and therefore could not go home.  Upon  further questioning, the patient reports generalized weakness over the  last 2 weeks.  He has had some headaches intermittently, has some  dizziness.  He has continued to work and worked as late as Friday.  His  weight has been down 8 pounds.  His blood sugars been relatively stable  with very low blood sugars.  Has had no cardiac-type chest pain.  His  breathing has been in baseline.  He has no fevers, chills, or sweats,  but was hot during this episode earlier.  He has had no other neurologic  complaints.  He has had no unusual travels or exposures.   PAST MEDICAL HISTORY:  Includes coronary artery disease with bypass  surgery in 1997 and 2000.  He has peripheral vascular disease, carotid  disease with multiple bilateral carotid surgeries, hypothyroidism, type  2 diabetes, hyperlipidemia, hypertension, and gout.  He has had shoulder  surgery.  He has primary hypothyroidism, psoriasis, Meniere's disease,  degenerative joint disease, had lumbar laminectomies, had a left  thoracotomy  due to fungal infection.   FAMILY HISTORY:  The patient is a widower with 3 grown children.  He  works Merchant navy officer of the phone.  He has never smoked.  Does not drink  alcohol.  His father died at 58 of heart disease.  Mother died at 68  with complications of diabetes.  He has a brother who died at 88 of  heart disease.   MEDICATION LIST:  1. Metformin 850 mg twice daily.  2. Lotrel 10/20 mg once daily.  3. HCTZ 25 mg once daily.  4. Synthroid 150 mcg once daily.  5. Mucosil 20 mg twice daily.  6. Toprol-XL 100 mg  twice daily.  7. Allopurinol 300 mg twice daily.  8. Crestor 20 mg once daily.  9. Plavix 75 mg once daily.  10.Ranexa 1000 mg twice daily.  11.Clonidine 0.2 mg twice daily.  12.Aspirin 325 mg once daily.  13.Lantus 40 mg in the morning.  14.NovoLog 10 mg twice daily.   PHYSICAL EXAMINATION:  VITAL SIGNS:  Blood pressure 137/63, pulse 56,  respirations 22, and temperature  96.7, and O2 sats 97%.  GENERAL:  We have a healthy-appearing, bearded white male lying quietly  nearly flat without dyspnea.  Sclerae anicteric.  Face appears generally  symmetric.  Oral mucous membranes appear moist.  NECK:  Supple without JVD, bruits are present bilaterally with multiple  well-healed scars.  No thyromegaly is present.  LUNGS:  Clear, but somewhat distant with no wheezes, rales, or rhonchi.  No accessory muscle use.  HEART:  Regular with aortic systolic murmur with no S3 or S4.  ABDOMEN:  Soft and nontender.  I feel no masses.  No pulsations.  No  liver edge.  No areas of real tenderness.  EXTREMITIES:  Reveal pulses to be slightly diminished with no edema.  Musculature is fair.  The patient is awake.  He is alert.  His mentation  is good.  Speech is clear.  No tremors present.  Memory appears intact.  There is no evidence of hallucination or delusions.   LAB DATA:  His PT/INR:  His INR is 1.1, PTT was 28.  His chemistries  show a sodium 139, potassium 4.1, chloride 105, BUN  30, creatinine 1.7,  and glucose of 117.  His lipase is 19.  His formal CMP, his sodium is  137, potassium 4, chloride 106, BUN 29, creatinine 1.62, and glucose of  123.  His SGOT is 25, SGPT is 28, total protein 6, albumin is 3.8, and  calcium is 8.5.  White count is 9000, hemoglobin is 12.5, MCV 92.2, and  platelets 156,000.  Cardiac markers show troponin less than 0.05 with  the point-of-care and myoglobin of 61.9 and point-of-care CK-MB of 1.4.  Radiology studies include a CT of the head without contrast that shows  no acute intracranial abnormality.  There were some artifacts present.  A chest x-ray showed low lung volumes and bibasilar atelectasis.   In summary, we have a 77 year old white male presenting with subjective  weakness, weight loss, and some mild dehydration.  He had an unusual  episode of abdominal pain and the burning has now resolved.  Certainly,  possible, this is some sort of gallbladder-type episode, but perfectly  normal liver function testing makes me less worried about this.  He has  had an ultrasound of the abdomen back in 2007, with no evidence for  gallstones at that point.  The fact that this has resolved without  intervention, also suggest acute cholecystitis.  His subjective weakness  basically was given as a reason why his family would not take him home.  I cannot find much on exam or laboratory testing.  Morning cortisol, we  checked; his TSH, we checked and we treated with a gentle hydration for  his mild dehydration.  There is no evidence of acute cardiac event  ongoing.  No evidence of neurologic event ongoing.  I am doubtful that  he has adrenal insufficiency, but it is worth checking.  His thyroid  status on replacement will also be checked.           ______________________________  Tera Mater Evlyn Kanner, M.D.     SAS/MEDQ  D:  01/05/2008  T:  01/05/2008  Job:  [id number]

## 2010-11-08 NOTE — Consult Note (Signed)
[name], BOZARD NO.:  [id number]   MEDICAL RECORD NO.:  [id number]          PATIENT TYPE:  EMS   LOCATION:  MAJO                         FACILITY:  [facility]   PHYSICIAN:  Peter M. [place], M.D.  DATE OF BIRTH:  22-Apr-1934   DATE OF CONSULTATION:  12/26/2007  DATE OF DISCHARGE:                                 CONSULTATION   [name] is a pleasant 77 year old white male who was seen in the  emergency department for evaluation of chest pain and dyspnea.  The  patient has known history of coronary artery disease status post redo  coronary artery bypass surgery in 2000.  He presented to emergency room  department earlier today with complaint of headache and blurred vision.  He describes his headache is frontal, constant in nature, and notes that  he has been having increasing headache in the last 3 weeks.  It is  associated with some blurred vision, especially when he looks to the  side or turns his head.  He states his headache is now resolved and he  feels much better than he did earlier today.  The patient also describes  2-3 episodes of recent substernal chest discomfort and shortness of  breath.  His chest pain is centrally located without radiation.  He has  no associated cough.  He does have dyspnea on exertion.  These symptoms  have been noted repeatedly in the past and in fact Dr. Elease Hashimoto has  performed cardiac catheterizations in 2006, and in December 2008, for  similar symptoms.  His last cardiac catheterization in December showed  patent grafts to the LAD, diagonal, and right coronary.  There was noted  to be severe disease in the vein graft to the obtuse marginal vessel and  the obtuse marginal system was also severely diseased and very small and  was not felt to warrant any intervention.  He has been treated medically  since then.  The patient does have a history of carotid arterial disease  and has had multiple bilateral carotid surgeries.  He did have  follow-up  in March 2009, with Dr. Edilia Bo with Doppler showing a chronic 68% right  carotid artery stenosis.  Currently, the patient is symptom free.   PAST MEDICAL HISTORY:  1. Coronary artery disease status post coronary artery bypass surgery      1997, and redo in 2000.  2. Peripheral vascular disease.  3. Carotid arterial disease status post multiple bilateral carotid      surgeries.  4. Hypothyroidism.  5. Insulin dependent diabetes mellitus.  6. Hyperlipidemia.  7. Hypertension.  8. Gout.  9. Status post shoulder surgery.   ALLERGIES:  To IODINE.   CURRENT MEDICATIONS:  1. Metformin 850 mg twice a day.  2. Amlodipine and benazepril 10/20 mg per day.  3. Hydrochlorothiazide 25 mg per day.  4. Synthroid 150 mcg per day.  5. Omeprazole 20 mg twice a day.  6. Toprol XL 100 mg twice a day.  7. Allopurinol 300 mg twice a day.  8. Crestor 20 mg daily.  9. Plavix 75 mg once a day.  10.Clonidine 0.2 mg twice a day.  11.Ranexa 500 mg twice a day.  12.Aspirin 325 mg daily.  13.Lantus insulin 40 units every morning.  14.NovoLog insulin 10 units twice daily.   SOCIAL HISTORY:  Patient is married.  He works at [employer].  He  denies history of tobacco or alcohol use.   FAMILY HISTORY:  Father died of myocardial infarction age 3.  Mother  died age 74 of old age.   REVIEW OF SYSTEMS:  He has had no increased edema, orthopnea or PND.  He  has had no recent TIA or CVA symptoms.  He denies any amaurosis fugax.  He has had no recent bleeding.  His appetite has been normal.  He has  had no urinary complaints.  Other review of systems is negative in  detail.   PHYSICAL EXAMINATION:  GENERAL:  Patient is a well-developed white male  in no apparent distress.  Blood pressure 149/68, pulse is 57, and  regular.  He is afebrile, sats were 98% on room air.  HEENT EXAM:  He is normocephalic, atraumatic.  His pupils equal, round,  reactive to light and accommodation.  His extraocular  movements are  full.  He has no nystagmus.  Sclerae clear.  Oropharynx is clear.  NECK:  Supple without JVD, adenopathy or thyromegaly.  He has bilateral  carotid endarterectomy scars with a bruit on the right. LUNGS:  Clear to  auscultation and percussion.  CARDIAC EXAM:  Reveals irregular rate and rhythm without gallop, murmur,  rub or click.  ABDOMEN:  Soft, nontender.  His bowel sounds are positive.  There are no  masses.  EXTREMITIES:  Without cyanosis, clubbing or edema.  His pedal pulses are  2+ and symmetric.  SKIN:  Warm and dry.  NEUROLOGIC EXAM:  He is alert and oriented x4.  His cranial nerves II-  XII are intact.  He has no focal motor or sensory deficits.   LABORATORY DATA:  CT of the head shows mild small vessel ischemic  changes.  His chest x-ray shows no active disease.  His ECG shows sinus bradycardia with an incomplete right bundle branch  block with no acute changes compared to 2000.  Urinanalysis is negative.  White count 6800, hemoglobin 13.4, hematocrit 38.3, platelets 160,000.  Sodium is 133, potassium 4.8, chloride 100, CO2 is not recorded, BUN 33,  creatinine 1.5, glucose 190.  Cardiac enzymes were negative times 1.   IMPRESSION:  1. Chest pain and dyspnea on exertion episodic.  This is consistent      with the patient's prior history and is very much as described in      his previous histories in 2006, and 2008, when he had his previous      cardiac catheterization.  I think this is his chronic baseline      state.  Do not see any evidence of acute coronary syndrome or      congestive heart failure at this time.  2. Headache.  Now resolved.  CT of the head was unremarkable.  3. Coronary disease status post redo coronary artery bypass graft.  4. Carotid arterial disease status post multiple surgeries.  5. Insulin-dependent diabetes mellitus.  6. Hypertension.  7. Hyperlipidemia.   PLAN:  It is my recommendation that the patient be discharged from   emergency department.  I do not feel that he merits acute inpatient  evaluation.  Recommend he follow-up Dr. Elease Hashimoto next week to reevaluate  his coronary  symptoms.  He will continue on the same medications at this time.  He  was counseled that he should have any worsening shortness of breath or  chest discomfort that was not responds to nitroglycerin, she can return  to the emergency department.           ______________________________  Peter M. [place], M.D.     PMJ/MEDQ  D:  12/26/2007  T:  12/27/2007  Job:  [id number]   cc:   Vesta Mixer, M.D.  Barry Dienes Eloise Harman, M.D.  Di Kindle. Edilia Bo, M.D.

## 2010-11-08 NOTE — Assessment & Plan Note (Signed)
OFFICE VISIT   [name], [name]  DOB:  Nov 01, 1933                                       09/08/2008  ZOXWR#:[id number]   I saw the patient in the office today for continued followup of his  carotid disease.  He has had multiple previous carotid operations.  His  original left carotid endarterectomy was in 1990 by Dr. Nedra Hai.  In 1997 I  did a redo left carotid endarterectomy at the time of his open heart  surgery.  He had a recurrent stenosis in 2000 on the left and ultimately  underwent replacement of the carotid with an interposition vein graft in  July of 2001.  He had a redo coronary artery bypass surgery in August of  2002 and I did a right carotid endarterectomy with Dacron patch at that  time.  I have been following recurrent carotid stenosis on the right.  He is on aspirin and Plavix.   Since I saw him last he has had no history of stroke, TIAs, expressive  or receptive aphasia or amaurosis fugax.   There has been no significant change in his medical history.   REVIEW OF SYSTEMS:  On review of systems he has had no recent chest  pain, chest pressure, palpitations or arrhythmias.  He has had no  productive cough, bronchitis, asthma or wheezing.   PHYSICAL EXAMINATION:  General:  On physical examination this is a  pleasant 77 year old gentleman who appears his stated age.  Vital signs:  Blood pressure 156/79, heart rate is 66.  Neck:  Neck is supple and he  has a right carotid bruit.  Lungs:  Clear bilaterally to auscultation.  Cardiac:  He has a regular rate and rhythm.  Neurologic:  Is nonfocal.   Carotid duplex scan shows that he has a 60-79% recurrent right carotid  stenosis.  The velocities have increased slightly on the right.  On the  left side he has no significant recurrent stenosis.  Both vertebral  arteries are patent with normally directed flow.   I have explained we generally would not consider redo carotid  endarterectomy unless the  stenosis clearly progressed to greater than  80% or he became symptomatic.  I have recommend we follow him at 6 month  intervals, however, the patient feels quite strongly about continuing  with the 1 month followup schedule.  He knows to continue taking his  aspirin and Plavix.  He will call sooner if he has problems.  Otherwise  we will plan on seeing him back in 1 year with a followup duplex.   Di Kindle. Edilia Bo, M.D.  Electronically Signed   CSD/MEDQ  D:  09/08/2008  T:  09/10/2008  Job:  [id number]

## 2010-11-08 NOTE — Op Note (Signed)
[name], [name]               ACCOUNT NO.:  [id number]   MEDICAL RECORD NO.:  [id number]          PATIENT TYPE:  AMB   LOCATION:  [facility]                         FACILITY:  [facility]   PHYSICIAN:  Heloise Purpura, MD      DATE OF BIRTH:  01/24/1934   DATE OF PROCEDURE:  08/05/2007  DATE OF DISCHARGE:                               OPERATIVE REPORT   PREOPERATIVE DIAGNOSIS:  Hematuria.   POSTOPERATIVE DIAGNOSIS:  Hematuria.   PROCEDURES:  1. Cystoscopy.  2. Bilateral retrograde pyelography.  3. Saline bladder washing for cytology.   SURGEON:  Heloise Purpura, MD.   ANESTHESIA:  General.   COMPLICATIONS:  None.   INTRAOPERATIVE FINDINGS:  Bilateral retrograde pyelography demonstrated  normal caliber ureters without dilation or filling defects.  The renal  collecting systems bilaterally were without evidence of filling defects  or hydronephrosis.   INDICATIONS:  Mr. Adachi is a 77 year old gentleman who presented with  gross hematuria.  He was initially scheduled to undergo an outpatient  evaluation for his hematuria.  He had a serum creatinine checked which  was found to be elevated at 1.93.  For this reason, he was unable to  undergo a contrasted CT scan.  He therefore underwent a renal ultrasound  which demonstrated no abnormalities.  He was therefore counseled  regarding options and it was recommended that he undergo cystoscopy with  bilateral retrograde pyelography for further evaluation and to complete  his hematuria evaluation.  Potential risks, complications, and  alternative options were discussed with the patient and informed consent  was obtained.   DESCRIPTION OF PROCEDURE:  The patient was taken to the operating room  and a general anesthetic was administered.  He was given preoperative  antibiotics, placed in the dorsal lithotomy position, and prepped and  draped in the usual sterile fashion.  Next, a preoperative time-out was  performed.  Cystourethroscopy was then  performed which demonstrated a  normal anterior and posterior urethra except for some bilobar  hypertrophy of the prostate.  There did not appear to be an extensive  median lobe.  On inspection of the bladder, there was mild to moderate  trabeculation along with a bladder diverticulum on the left anterior  aspect of the bladder.  No visible bladder tumors or other mucosal  lesions were identified.  A saline bladder washing was obtained after  the bladder had been inspected carefully with both the 12 and 70 degree  lenses.  Attention was then turned to the left ureteral orifice and it  was cannulated with a cone-tip catheter.  Contrast was injected,  allowing visualization of the left renal and ureteral collecting system.  An identical procedure was performed on the contralateral side.  Findings are as dictated above.  The patient's bladder was then emptied  and he was able to be awakened and transferred to the recovery unit in  satisfactory condition.   PLAN:  Mr. Ballow does not appear to have any concerning etiology for his  gross hematuria, we will plan to concentrate on treating his lower  urinary tract symptoms.  Due to the fact  that he did have a presyncopal  episode associated with his alpha blocker recently, I will plan to begin  him on finasteride and have him follow up in six months for further  evaluation.      Heloise Purpura, MD  Electronically Signed     LB/MEDQ  D:  08/05/2007  T:  08/06/2007  Job:  [id number]   cc:   Barry Dienes. Eloise Harman, M.D.  Fax: [phone]

## 2010-11-08 NOTE — Discharge Summary (Signed)
[name], [name]               ACCOUNT NO.:  [id number]   MEDICAL RECORD NO.:  [id number]          PATIENT TYPE:  OBV   LOCATION:  5125                         FACILITY:  [facility]   PHYSICIAN:  Tera Mater. Evlyn Kanner, M.D. DATE OF BIRTH:  Aug 01, 1933   DATE OF ADMISSION:  01/04/2008  DATE OF DISCHARGE:  01/05/2008                               DISCHARGE SUMMARY   DISCHARGE DIAGNOSES:  1. Generalized weakness without overt findings.  2. Type 2 diabetes with fair control.  3. Hypertension with fair control.  4. Mild dehydration, resolved.  5. Abdominal burning, resolved.  6. Carotid disease, resolved.   HOSPITAL COURSE:  [name] is a 77 year old white male admitted from  the ER with weakness.  See the admission history and physical for  details.  His workup here has been unrevealing of any source of  weakness.  Additional pending lab tests included TSH and cortisol which  we followed up as an outpatient.  His mild dehydration has been  corrected.  His cardiac markers were negative.  We are going to  discharge him home and have followup as an outpatient.   LABORATORY DATA:  His troponin was less than 0.01.  CK was 89 with an MB  of 3.2.  Today's chemistries, sodium 135, potassium 4.1, chloride 104,  CO2 24, BUN 27, creatinine 1.46, glucose 192, calcium was 8.6.  PT/INR  was 1.1.  Yesterday his creatinine was 1.7 with a BUN of 30, lipase was  19.  Formal chemistries, sodium 137, potassium 4, chloride 106, CO2 25,  BUN 29, creatinine 1.62, glucose 123.  White count was 9000, hemoglobin  12.5, platelets 156,000.  Troponin was less than 0.05 and less than  0.05.  A CT of the head without contrast showed no acute intracranial  abnormality.  A chest x-ray showed low lung volumes with bibasilar  atelectasis, otherwise no problems.   In summary, we have a 77 year old admitted with weakness and mild  dehydration.  We have partially corrected dehydration and I find no  other issues.  I believe  he can be worked up further as an outpatient.   DISCHARGE MEDICATIONS:  1. Aspirin 325 daily.  2. Lantus as an increased dose of 45 daily.  3. NovoLog 10 twice daily.  4. Proscar 5 once daily.  5. Metformin will be stopped due to his creatinine elevation and Dr.      Jarold Motto and I have decided on that.  6. He is on Lotrel 10/20 daily.  7. Hydrochlorothiazide 20 daily.  8. Synthroid 150 mcg daily.  9. Omeprazole 20 twice daily.  10.Toprol-XL twice daily.  11.Allopurinol 300 daily.  12.Crestor 20 daily.  13.Plavix 75 daily.  14.Ranexa 1000 twice daily.  15.Clonidine 0.2 twice daily.  16.Centrum Silver.   FOLLOWUP:  He is to follow up this week with Dr. Jarold Motto.  He is to  keep up on his fluids.  He is to use no concentrated sweets diet.  He  should check his sugars as before and labs needed to be followed up on  include his cortisol and his TSH.  ______________________________  Tera Mater Evlyn Kanner, M.D.     SAS/MEDQ  D:  01/05/2008  T:  01/05/2008  Job:  [id number]

## 2010-11-08 NOTE — Procedures (Signed)
CAROTID DUPLEX EXAM   INDICATION:  Followup of known carotid artery disease.   HISTORY:  Diabetes:  Yes.  Cardiac:  CABG in 1997 and in 2002.  Hypertension:  Yes.  Smoking:  No.  Previous Surgery:  Right carotid endarterectomy on 01/29/01, multiple  left carotid endarterectomies, including the most recent one performed  on 12/30/99.  CV History:  No.  Amaurosis Fugax No, Paresthesias No, Hemiparesis No.                                       RIGHT             LEFT  Brachial systolic pressure:         138               130  Brachial Doppler waveforms:         WNL               WNL  Vertebral direction of flow:        Antegrade         Antegrade  DUPLEX VELOCITIES (cm/sec)  CCA peak systolic                   94                71  ECA peak systolic                   152               Occluded  ICA peak systolic                   324               82  ICA end diastolic                   95                26  PLAQUE MORPHOLOGY:                  Calcified         Heterogenous  PLAQUE AMOUNT:                      Moderate-to-severe                  Mild  PLAQUE LOCATION:                    BIF, ICA          BIF, ICA   IMPRESSION:  1. High end of 60-79% right internal carotid artery restenosis.  2. 20-39% left internal carotid artery restenosis.   ___________________________________________  Di Kindle. Edilia Bo, M.D.   AC/MEDQ  D:  09/08/2008  T:  09/09/2008  Job:  [id number]

## 2010-11-08 NOTE — Procedures (Signed)
CAROTID DUPLEX EXAM   INDICATION:  Followup carotid endarterectomy.   HISTORY:  Diabetes:  Yes  Cardiac:  CABG  Hypertension:  Yes  Smoking:  No  Previous Surgery:  Right carotid endarterectomy on January 29, 2001, most  recent left carotid endarterectomy on December 30, 1999  CV History:  Currently asymptomatic  Amaurosis Fugax No, Paresthesias No, Hemiparesis No                                       RIGHT             LEFT  Brachial systolic pressure:         146               144  Brachial Doppler waveforms:         Normal            Normal  Vertebral direction of flow:        Antegrade         Antegrade  DUPLEX VELOCITIES (cm/sec)  CCA peak systolic                   89                67  ECA peak systolic                   154               Not visualized  ICA peak systolic                   344               97  ICA end diastolic                   69                25  PLAQUE MORPHOLOGY:                  Heterogeneous     Heterogeneous  PLAQUE AMOUNT:                      Severe            Mild  PLAQUE LOCATION:                    ICA/CCA           ICA/CCA   IMPRESSION:  1. Patent right carotid endarterectomy site with Doppler velocities      suggestive of high-end 60% to 79% stenosis of the right proximal      internal carotid artery.  2. Patent left carotid endarterectomy site with no hemodynamically      significant increase in Doppler velocities of left internal carotid      artery and mild plaque formation noted.  3. The left external carotid artery was not adequately visualized.  4. No significant change in Doppler velocities when compared to the      previous exam on April 28, 20121.   ___________________________________________  Di Kindle. Edilia Bo, M.D.   CH/MEDQ  D:  06/02/2010  T:  06/02/2010  Job:  [id number]

## 2010-11-08 NOTE — Procedures (Signed)
CAROTID DUPLEX EXAM   INDICATION:  Followup, bilateral carotid endarterectomy.   HISTORY:  Diabetes:  Yes.  Cardiac:  CABG in 1997 and in 2002.  Hypertension:  Yes.  Smoking:  No.  Previous Surgery:  Bilateral carotid endarterectomy in the left multiple  times.  CV History:  Amaurosis Fugax No, Paresthesias No, Hemiparesis No                                       RIGHT             LEFT  Brachial systolic pressure:         150               160  Brachial Doppler waveforms:         Biphasic          Biphasic  Vertebral direction of flow:        Antegrade         Antegrade  DUPLEX VELOCITIES (cm/sec)  CCA peak systolic                   90                87  ECA peak systolic                   157               Occluded  ICA peak systolic                   371               128  ICA end diastolic                   90                19  PLAQUE MORPHOLOGY:                  Calcified         Heterogenous  PLAQUE AMOUNT:                      Moderate          Mild  PLAQUE LOCATION:                    ICA, ECA          ICA   IMPRESSION:  1. 60-79% stenosis noted in the right internal carotid artery.  2. 20-39% stenosis noted in the left internal carotid artery.  3. Antegrade bilateral vertebral arteries.  4. No changes noted from previous study.   ___________________________________________  Di Kindle. Edilia Bo, M.D.   MG/MEDQ  D:  09/10/2007  T:  09/10/2007  Job:  [id number]

## 2010-11-08 NOTE — Telephone Encounter (Signed)
Wanted to let you know that he is going to try and not take the medication that you talked about and he'll let you know how he does.

## 2010-11-08 NOTE — Procedures (Signed)
CAROTID DUPLEX EXAM   INDICATION:  Carotid disease.   HISTORY:  Diabetes:  Yes.  Cardiac:  CABG.  Hypertension:  Yes.  Smoking:  No.  Previous Surgery:  Right carotid endarterectomy on 01/29/2001, most  recent left carotid endarterectomy on 12/30/1999.  CV History:  Intermittent dizziness.  Amaurosis Fugax No, Paresthesias No, Hemiparesis No                                       RIGHT             LEFT  Brachial systolic pressure:         134               132  Brachial Doppler waveforms:         Normal            Normal  Vertebral direction of flow:        Antegrade         Antegrade  DUPLEX VELOCITIES (cm/sec)  CCA peak systolic                   94                92  ECA peak systolic                   141               Not visualized  ICA peak systolic                   361               108  ICA end diastolic                   93                33  PLAQUE MORPHOLOGY:                  Mixed             Mixed  PLAQUE AMOUNT:                      Moderate/severe   Mild  PLAQUE LOCATION:                    ICA/ECA/CCA       ICA/ECA/CCA   IMPRESSION:  1. Patent right carotid endarterectomy site with a high-end 60% to 79%      stenosis of the right internal carotid artery.  2. Patent left carotid endarterectomy site with a 1% to 39% stenosis      of the left internal carotid artery.  3. No significant change noted when compared to the previous exam on      09/08/2008.   ___________________________________________  Di Kindle. Edilia Bo, M.D.   CH/MEDQ  D:  03/11/2009  T:  03/11/2009  Job:  [id number]

## 2010-11-08 NOTE — Assessment & Plan Note (Signed)
OFFICE VISIT   [name], [name]  DOB:  [birth year]                                       09/10/2007  VHQIO#:[id number]   I saw patient in the office today for continued followup of his carotid  disease.  I have been seeing him on a yearly basis.  He has had multiple  previous carotid procedures.  His original left carotid endarterectomy  by Dr. Nedra Hai was in 1990.  In 1997, I did a redo left carotid  endarterectomy in combination with his open heart surgery by Dr. Donata Clay.  He had a recurrent stenosis in 2000 on the left and ultimately  required replacement with a carotid with an interposition vein graft in  07/01.  He had redo coronary artery bypass surgery in 08/02 and then a  right carotid endarterectomy with Dacron patch angioplasty in 08/02 at  the time of his redo coronary artery bypass surgery.  I have been  following him with a recurrent stenosis on the right.   Since I saw him last in 03/08, he denies any history of stroke, TIAs,  expressive or receptive aphasia, or amaurosis fugax.  He continues to  take aspirin daily and is also on Plavix.   His medications are listed on the medical history form in his chart.   REVIEW OF SYSTEMS:  He has had no recent chest pain, chest pressure,  palpitations, or arrhythmias.  He has had no bronchitis, asthma, or  wheezing.   PHYSICAL EXAMINATION:  This is a pleasant 77 year old gentleman who  appears his stated age.  Blood pressure is 143/70, heart rate 56.  He  has a right carotid bruit.  Lungs are clear bilaterally to auscultation.  On cardiac exam, he has a regular rate and rhythm.  His abdomen is soft  and nontender.  Neurologic exam is nonfocal with good strength in the  upper extremities and lower extremities bilaterally.   His carotid duplex scan shows a 60-79% right carotid stenosis.  The  velocities have not changed significantly, compared to the study a year  ago.  On the left side, he has no  evidence of recurrent stenosis where  he has the interposition vein.  Both vertebral arteries are patent with  normally directed flow.   Overall, I am pleased with his progress, and I will see him back in one  year for a follow-up carotid duplex scan.  He knows to call sooner if he  has problems.  In the meantime, he knows to continue taking his aspirin.   Di Kindle. Edilia Bo, M.D.  Electronically Signed   CSD/MEDQ  D:  09/10/2007  T:  09/11/2007  Job:  [id number]

## 2010-11-08 NOTE — Procedures (Signed)
CAROTID DUPLEX EXAM   INDICATION:  Follow up carotid artery disease.   HISTORY:  Diabetes:  Yes.  Cardiac:  CABG.  Hypertension:  Yes.  Smoking:  No.  Previous Surgery:  Right carotid endarterectomy on 01/29/01, most recent  left carotid endarterectomy 12/30/99.  CV History:  No.  Amaurosis Fugax No, Paresthesias No, Hemiparesis No.                                       RIGHT             LEFT  Brachial systolic pressure:         108               106  Brachial Doppler waveforms:         WNL               WNL  Vertebral direction of flow:        Antegrade         Antegrade  DUPLEX VELOCITIES (cm/sec)  CCA peak systolic                   101               94  ECA peak systolic                   169               Not visualized  ICA peak systolic                   352               125  ICA end diastolic                   89                40  PLAQUE MORPHOLOGY:                  Heterogenous      Heterogenous  PLAQUE AMOUNT:                      Severe            Moderate  PLAQUE LOCATION:                    ICA, ECA          ICA   IMPRESSION:  1. Right internal carotid artery suggests 60% to 79% stenosis (high      end of range).  2. Left internal carotid artery suggests 40% to 59% stenosis.  3. Antegrade flow in bilateral vertebrals.  4. Left external carotid artery not visualized.   ___________________________________________  Di Kindle. Edilia Bo, M.D.   CB/MEDQ  D:  10/21/2009  T:  10/21/2009  Job:  [id number]

## 2010-11-11 NOTE — Discharge Summary (Signed)
[place]. [facility]  Patient:    [name], [name]                      MRN: [id number] Adm. Date:  [id number] Disc. Date: 02/04/01 Attending:  Mikey Bussing Dictator:   Maxwell Marion, RNFA CC:         Barry Dienes. Eloise Harman, M.D.  Arturo Morton. Riley Kill, M.D. [facility]   Discharge Summary  DATE OF BIRTH:  Dec 13, 1933  ADMISSION DIAGNOSES:  1. Recurrent and progressive coronary artery disease.  2. Right carotid artery stenosis.  PAST MEDICAL HISTORY:  1. Coronary artery disease, status post coronary artery bypass graft in 1997     by Dr. Kathlee Nations Trigt.  2. History of recurrent left carotid stenosis.  He is status post surgery on     the left carotid artery in 1990, 1997, and in June and July of 2001.  3. Hypertension.  4. Hypercholesterolemia.  5. Hypothyroidism.  6. Diabetes mellitus type 2.  7. Gout.  8. Psoriasis.  9. GERD. 10. Menieres disease. 11. Degenerative joint disease. 12. He is status post lumbar laminectomy July 2001. 13. Status post left thoracotomy due to fungal infection.  ALLERGIES:  BETADINE and ______ cause a rash.  DISCHARGE DIAGNOSES:  1. Coronary artery disease and right carotid stenosis, status post redo     coronary artery bypass grafting and carotid endarterectomy on the right.  2. Postoperative anemia, resolving.  HOSPITAL COURSE:  [name] is a 77 year old white man who was referred to the CVTS office by Dr. Riley Kill for evaluation of recurrent unstable angina.  He describes a recent history of accelerating angina attacks, which had first come on with exertion and then progressed to rest pain.  He presented to [facility] Room and was seen there by Dr. Riley Kill, then underwent left heart catheterization on July 18.  The catheterization demonstrated a patent vein graft to the obtuse marginal, an occluded vein graft to the small circumflex marginal, and an atretic mammary artery graft to the right  coronary artery.  The LAD, which was not grafted previously, now had a high-grade 90% stenosis.  He was referred to Dr. Donata Clay for surgical revascularization. [name] has been followed by Dr. Waverly Ferrari at the CVTS office for an asymptomatic, greater than 80%, right internal carotid artery stenosis. After examination of the patient and review of all the records, both Dr. Donata Clay and Dr. Edilia Bo recommended a combined surgery of redo coronary artery bypass grafting and right carotid endarterectomy.  On January 29, 2001, [name] was electively admitted to [facility] in the care of Dr. Donata Clay and Dr. Edilia Bo.  He underwent an uncomplicated right carotid endarterectomy with Dacron patch angioplasty by Dr. Waverly Ferrari and redo coronary artery bypass grafting x 3 with Dr. Kathlee Nations Trigt.  Grafts placed at the time of procedure were the left internal mammary artery to the left anterior descending artery, saphenous vein was grafted to the diagonal artery, saphenous vein was grafted to the posterior descending artery. Saphenous vein was harvested from the left lower leg for the bypass grafts. At the conclusion of the procedure, he was transferred in stable condition to the SICU.  Postoperative, [name] has had episodes of hypertension and his blood glucose was also elevated postoperatively.  His medications have been adjusted accordingly and his blood pressure and blood glucose levels are within a more normal range at this  time.  Postoperatively, he also had some anemia and was transfused for a hematocrit of 22 on August 8.  A repeat hemoglobin and hematocrit taken on August 9 revealed 9.0 hemoglobin, 25.5 hematocrit.  [name] is making very good progressing recovering from his surgery.  It is anticipated that he will be ready for discharge home tomorrow, February 04, 2001.  CONDITION ON DISCHARGE:  Improved.  DISCHARGE INSTRUCTIONS:  Instructions on  discharge included medications, activity, diet, wound care, and followup appointments.  Please see the discharge instruction sheet for details.  DISCHARGE MEDICATIONS:  1. Enteric-coated aspirin 325 mg p.o. q.d.  2. Darvocet-N 100 one to two p.o. q.4-6h. p.r.n. for pain.  3. Lasix 40 mg p.o. q.d. x 5 days.  4. Potassium chloride 20 mEq p.o. q.d. x 5 days.  5. Niferex 150 mg p.o. q.d.  6. Colace 200 mg p.o. q.d.  7. Allopurinol 300 mg p.o. q.d.  8. Synthroid 150 mcg p.o. q.d.  9. Nexium 40 mg p.o. q.d. 10. Lipitor 40 mg p.o. q.d. 11. Glyburide 5 mg p.o. b.i.d. 12. Glucophage 850 mg p.o. b.i.d. 13. Humulin N insulin 18 units in the morning and 28 units in the evening. 14. Lotensin 20 mg p.o. b.i.d. 15. Norvasc 5 mg p.o. q.d. 16. Multivitamin 1 a day. 17. Vitamin E as he was taking before.  FOLLOW-UP:  [name] will have an appointment at the CVTS office for staple removal on Monday, August 19.  The office will call with the time for this appointment.  He will also have an appointment to see Dr. Riley Kill back in his office in approximately two weeks and he will be asked to have a chest x-ray at that time.  He will also have an appointment to see Dr. Kathlee Nations Trigt in the office on August 30.  The office will call with the time for that appointment. DD:  02/03/01 TD:  02/03/01 Job: [id number]

## 2010-11-11 NOTE — Discharge Summary (Signed)
[place]. [facility]  Patient:    [name], PROCH                      MRN: [id number] Adm. Date:  [id number] Disc. Date: 01/12/01 Attending:  Mikey Bussing Dictator:   Tereso Newcomer, P.A.-C. CC:         Leroy Sea., M.D.   Discharge Summary  DATE OF BIRTH:  1933/09/24  DISCHARGE DIAGNOSES:  1. Unstable angina.  2. Coronary artery disease.  3. Status post coronary artery bypass grafting in 1997 with right internal     mammary artery to right coronary artery, saphenous vein graft to obtuse     marginal #2.  4. Cardiac catheterization this admission revealing native three vessel     disease, complex left anterior descending lesion with 70% mid at ____,     50% diagonal #1/70% ostial diagonal #2, and 30% mid stenosis in saphenous     vein graft to obtuse marginal #1 graft, saphenous vein graft to obtuse     marginal #2 graft occluded, and an atretic _______ of the right coronary     artery graft.  5. Left ventricular ejection fraction 65% by cardiac catheterization this     admission.  6. Severe right internal carotid artery stenosis.  7. Hypertension.  8. Diabetes mellitus, type 2.  9. Hyperlipidemia. 10. Positive family history of coronary artery disease. 11. Hypothyroidism. 12. History of atherosclerotic peripheral vascular disease. 13. Gout/degenerative joint disease. 14. Gastroesophageal reflux disease. 15. Psoriasis. 16. Menieres disease. 17. Status post left carotid endarterectomy times four. 18. History of partial left pneumonectomy secondary to fungal infection. 19. History of back surgery. 20. History of shoulder surgery. 21. History of knee surgery.  PROCEDURES PERFORMED:  As mentioned, cardiac catheterization by Dr. Veneda Melter on January 10, 2001.  HOSPITAL COURSE:  This is a 77 year old male who presented to [facility] on January 09, 2001, with complaints of chest pain for two weeks.  The pain had increased in  frequency and intensity the last two days.  He had five episodes in the previous two days before admission.  His pain was not associated with exertion and was lasting up to five to six hours.  His pain was decreased, but not relieved by nitroglycerin.  He had associated diaphoresis, no nausea or vomiting.  He did have shortness of breath and fatigue, as well as dyspnea with exertion, and palpitations.  On initial exam, his blood pressure was 125/71, pulse 68, respirations 18, and temperature 99.5.  Neck without JVD.  He had positive bilateral bruits, right greater than left.  Chest clear to auscultation bilaterally.  Cardiac regular rate and rhythm, positive S4.  Abdomen soft and nontender.  Extremities without edema.  EKG shows sinus rhythm with no acute changes.  Chest x-ray with slight left costophrenic angle blunting, no acute disease.  The patient was admitted and placed on heparin, nitroglycerin, and Integrilin. He had mildly elevated CKs, but negative troponin x 3.  His first set of enzymes revealed a CK of 327, CKMB of 11.7, troponin I of 0.02.  Second set CK 257, CKMB 7.4, troponin I 0.02.  Third set CK 215, CKMB 5.8, and troponin I 0.01.  Fourth set CK 167, MB 5.2.  Next set CK 135, MB 4.2.  Next set CK 135, MB 5.1.  The patient went in for cardiac catheterization with Dr. Veneda Melter on January 10, 2001.  The results are noted above.  The patient tolerated the procedure well and had no immediate complications.  The angiograms were reviewed with Dr. Riley Kill.  He asked Dr. Donata Clay to review for consideration of redo CABG of the LAD lesion was not ideal for ______.  Dr. Donata Clay saw the patient on January 11, 2001.  He discussed this with Dr. Edilia Bo.  The patient remained well on January 12, 2001, without further chest pain or shortness of breath.  His groin was stable without hematomas or bruise.  Dr. Donata Clay coordinated redo CABG and right CEA to be performed on January 29, 2001.  Therefore, the patient was discharged to home in stable condition.  He was given a new prescription for Imdur and nitroglycerin sublingual p.r.n. chest pain.  He was told to return to the hospital emergency room for any recurrent chest pain.  Otherwise, he would return to [facility] on January 29, 2001, for surgery.  LABORATORY DATA:  White blood cell count 6800, hemoglobin 13.1, hematocrit 36.8, platelet count 127,000.  INR 1.0.  Sodium 136, potassium 3.5, chloride 103, CO2 28, glucose 138, BUN 17, creatinine 1.1, calcium 8.6, total protein 6.3, albumin 3.9, AST 39, ALT 33, alkaline phosphatase 681, total bilirubin 1.6.  Cardiac enzymes as noted above.  Hemoglobin A1C 6.2 with profile total cholesterol 81, triglycerides 204, HDL 22, LDL 18.  DISCHARGE MEDICATIONS:  1. Imdur 30 mg q.d.  2. Aspirin 325 mg q.d.  3. Lotensin 20 mg b.i.d.  4. Lopressor 50 mg b.i.d.  5. Humulin N.  6. Allopurinol 300 mg q.d.  7. Glyburide 5 mg q.d.  8. Synthroid 0.15 mg q.d.  9. Norvasc 5 mg q.d. 10. Maxzide 37.5/25 mg q.d. 11. Nexium 40 mg q.d. 12. Lipitor 40 mg q.h.s. 13. Metformin 850 mg b.i.d. to be restarted on Sunday, January 13, 2001. 14. Restoril p.r.n. sleep. 15. Nitroglycerin p.r.n. chest pain.  ACTIVITY:  No heavy lifting or exertional work until after recovery from surgery.  DIET:  Low fat and low sodium diabetic.  WOUND CARE:  The patient should call the office for any evidence of bleeding or bruising.  He is to return to the hospital for surgery on January 29, 2001, as outlined by Dr. Donata Clay.  He will see Dr. Riley Kill after surgery.  As noted above, the patient is to return to the hospital emergency room if he has any recurrent chest pain.  He understands this. DD:  01/30/01 TD:  01/30/01 Job: [id number]

## 2010-11-11 NOTE — Cardiovascular Report (Signed)
[name], [name]               ACCOUNT NO.:  [id number]   MEDICAL RECORD NO.:  [id number]          PATIENT TYPE:  OIB   LOCATION:  2899                         FACILITY:  [facility]   PHYSICIAN:  Vesta Mixer, M.D. DATE OF BIRTH:  [birth year]   DATE OF PROCEDURE:  12/16/2004  DATE OF DISCHARGE:                              CARDIAC CATHETERIZATION   HISTORY OF PRESENT ILLNESS:  Mr. Mahon is a middle-aged gentleman with a  history of coronary artery disease. He has had coronary artery bypass  grafting in 1997 and again in 2000. He presents with symptoms of unstable  angina and an abnormal Cardiolite study revealing ischemia in the inferior  lateral basilar segments.   The right femoral artery was easily cannulated using modified Seldinger  technique.   HEMODYNAMICS:  1.  LV pressure of 145/90 with an aortic pressure of 145/66.  2.  Angiography:      1.  Left main is fairly normal.      2.  The left anterior descending artery has a proximal 60% stenosis.          There is mild to moderate diffuse irregularities.      3.  The first diagonal vessel was a moderate-sized vessel. It is          approximately 2-2.25 mm in diameter. There is a 70-80% stenosis in          the mid segment of the stent. This lesion is fairly long and would          require a 20-25 mm length stent.      4.  The left circumflex artery is occluded proximally. There is          collateral flow that fills the left circumflex region via the right          coronary graft. There is also some flow from a saphenous vein graft.      5.  The right coronary artery is dominant. There is a 60-70% mid stent          stenosis. The right coronary artery fills the posterolateral branch.          There are minor luminal irregularities in the distal posterolateral          branch.      6.  There is competitive flow from the RCA graft.      7.  The saphenous vein graft to the RCA graft is normal. There are minor  luminal irregularities in the RCA.      8.  The saphenous vein graft to the diagonal vessel was normal. It fills          the LAD in a retrograde fashion.          1.  The saphenous vein graft to the second obtuse marginal artery is              a very small vessel. There is a 90% mid graft stenosis. The              target native vessel was  very tiny and I do not think that              angioplasty or stenting of the saphenous vein graft would              increase the blood supply to this distal circumflex region.      9.  The left internal mammary artery is widely patent. The anastomosis          to the LAD is normal. There is competitive flow from the native LAD.  3.  The left ventriculogram was performed in a 30 RAO position. It reveals      overall normal left ventricular systolic function.   COMPLICATIONS:  None.   CONCLUSIONS:  Severe small vessel disease. The diagonal vessel is fairly  small and the lesion is long. I think that intervention of this lesion would  be fairly risky. We will continue with medical therapy and increase his  medicines. We will add Imdur 30 mg a day and Ranexa 500 mg twice a day. He  may need angioplasty in the future but we will wait and maximize medical  therapy first.       PJN/MEDQ  D:  12/16/2004  T:  12/16/2004  Job:  [id number]

## 2010-11-11 NOTE — Cardiovascular Report (Signed)
[place]  Patient:    [name], [name]                      MRN: [id number] Proc. Date: 01/10/01 Adm. Date:  [id number] Attending:  Ronaldo Miyamoto CC:         Arturo Morton. Riley Kill, M.D. [facility]   Cardiac Catheterization  PROCEDURES PERFORMED: 1. Left heart catheterization. 2. Left ventriculogram. 3. Selective coronary angiography. 4. Selective angiography with saphenous vein grafts. 5. Selective angiography to the left and right subclavian arteries.  DIAGNOSES: 1. Native three-vessel coronary artery disease. 2. Atherosclerosis, saphenous vein and internal mammary bypass grafts. 3. Normal left ventricular systolic function.  INDICATIONS:  The patient is a 77 year old gentleman, with a history of coronary artery disease, who had previously undergone coronary artery bypass graft surgery in 1997.  At that time he had placement of in situ RIMA to the distal RCA, saphenous vein graft to the first marginal branch and saphenous vein graft to the second marginal branch.  He has had well preserved LV function.  He had done well in the interim until the past two weeks when he had noted substernal chest pressure of increasing frequency progressing to rest.  This was associated with shortness of breath, fatigue and diaphoresis. He was admitted to the hospital with acute coronary syndrome and subsequently stabilized medically.  He had mild elevation of cardiac enzymes which were non diagnostic.  He presents now for further cardiac assessment.  TECHNIQUE:  After informed consent was obtained, the patient was brought to the cardiac catheterization lab where a 6 French sheath was placed in the right femoral artery.  Left heart catheterization and selective angiography were then performed using preformed Judkins catheters.  The right internal mammary artery cannot be selectively engaged due to tortuosity of the subclavian artery and acute takeoff of the  vessel from a vertical position in the subclavian artery and this was thus nonselectively opacified.  Future attempts at engagement may be made from the right branchial artery.  At the termination of the case, the sheath was removed and manual pressure applied until adequate hemostasis was achieved.  The patient tolerated the procedure well and was transferred to the ward in stable condition.  FINDINGS:  Findings are as follows: 1. Left main trunk:  The left main trunk has mild irregularities. 2. LAD:  This is a large caliber vessel that provides two diagonal branches    and then wraps around the apex.  The LAD has significant tapering and    stenosis in the mid section of 70% that extends to a large septal    perforator in the second diagonal branch.  The distal LAD has    irregularities of 30%.  The first diagonal branch has a focal narrowing    of 50% in its mid section.  The second diagonal branch is a medium caliber    vessel which has an ostial narrowing of 70% and has an acute takeoff from    the diagonal branch and appears to rise from the distal segment of the    previously noted stenosis. 3. Left circumflex artery:  This is a small caliber that is occluded in the    AV segment.  The first marginal branch is a small caliber vessel that is    seen via saphenous vein graft.  It has moderate diffuse disease of 60%    in its proximal segment.  The second marginal branch is  seen to fill via    collaterals from the distal right coronary artery and has mild    irregularities.  The stump of the saphenous vein graft is noted. 4. Right coronary artery:  The right coronary artery is dominant.  This is a    large caliber vessel that provides the posterior descending artery and a    posterior ventricular branch in its terminal segment.  There is a focal    stenosis of 50% in the mid section of the right coronary artery.  The    remainder of the vessel has mild diffuse disease. 5. Saphenous  vein graft to the first marginal branch is a small caliber    vessel with mild diffuse disease of 30% along its course. 6. Saphenous vein graft to the second marginal branch is occluded proximally. 7. RIMA to the right coronary artery is atretic. 8. Left subclavian artery is widely patent.  The left internal mammary artery    is patent and extends to the diaphragm and is of normal caliber.  LEFT VENTRICULOGRAM:  Left ventricle is of normal end-systolic and end-diastolic dimensions.  Overall left ventricular function is well preserved with an ejection fraction of greater than 65%.  No mitral regurgitation. LV pressure is 120/5, aortic is 120/60.  LVEDP equals 18.  ASSESSMENT AND PLAN:  The patient is a 77 year old gentleman, who presents with substernal chest discomfort.  He has evidence of occlusion of the saphenous vein graft to second marginal branch.  In addition he has a complex lesion in the LAD.  These films will be reviewed and further recommendations made regarding further management. DD:  01/10/01 TD:  01/10/01 Job: [id number]

## 2010-11-11 NOTE — Op Note (Signed)
[facility]  Patient:    [name], [name]                      MRN: [id number] Proc. Date: 12/30/99 Adm. Date:  [id number] Attending:  Bennye Alm CC:         Di Kindle. Edilia Bo, M.D.             Barry Dienes Eloise Harman, M.D.                           Operative Report  PREOPERATIVE DIAGNOSIS:  Recurrent left carotid stenosis.  POSTOPERATIVE DIAGNOSIS:  Recurrent left carotid stenosis.  PROCEDURE:  Repair of left recurrent carotid stenosis with an interposition saphenous vein graft harvested from the right thigh.  SURGEON:  Di Kindle. Edilia Bo, M.D.  ASSISTANT:  Loura Pardon, P.A.  ANESTHESIA:  General.  INDICATIONS FOR PROCEDURE:  This is a 77 year old gentleman who originally underwent carotid endarterectomy in 1990 by Dr. Nedra Hai. In November of 1997, he was found to have a recurrent carotid stenosis on the left and underwent replacement of the left carotid artery with interposition saphenous vein graft. Subsequently in June of 2000, he was found to have a stenosis within the graft and underwent repeat endarterectomy with Dacron patch angioplasty over the area of stenosis. He returned in June of 2001 a year later with recurrent stenosis by duplex. Arteriogram confirmed 2 tandem lesions at the site of the previous Dacron patch both measuring approximately 90%. He was also noted to have an approximately 90% right carotid stenosis within diastolic velocities of 241 cm per second on the right. As the stenosis on the left appeared tighter, it was felt that redo left carotid endarterectomy was indicated prior to stage right carotid endarterectomy. We did map the thighs to look for available saphenous vein and there appeared to be usable saphenous vein in the right thigh.  TECHNIQUE:  The patient was taken to the operating room and received a general anesthetic. The neck and upper chest and also the right thigh were prepped and draped in the  usual sterile fashion. The previous incision was opened and through dense scar tissue, the previous interposition vein graft was dissected free and the area of the Dacron patch was exposed. The Doppler was used to identify the 2 areas of stenosis. I found a nice soft vein from the previous replacement above and below this Dacron patch. Through a separate longitudinal incision in the right groin, a segment of greater saphenous vein was harvested with branches divided between clips and 3-0 silk ties. This was ligated at both ends with 2-0 silk ties and removed. This was then gently distended up with heparinized saline. Next, the patient was systemically heparinized with 10,000 units of IV heparin. Clamps were then placed on the vein graft proximal to the patch and also to the vein graft distal to the patch. The Dacron patch was opened longitudinally down to the below knee anastomosis down into the normal appearing vein at both ends. These both ends were then spatulated. The vein was then sewn in reverse fashion. The proximal anastomosis was performed first. Of note, I did lyse the valves in addition however to try to prevent recurrent stenosis of the valve site. The vein was sewn in a reverse fashion. The vein was spatulated and sewn end-to-end in a hand clasp fashion to the proximal vein from the previous repair using continuous 6-0 Prolene sutures. Two  sutures were used to prevent pursestring. The vein was then pulled to the appropriate length for anastomosis to the vein above the Dacron patch. Again both ends were spatulated and sewn in a hand clasp fashion using 2 continuous 6-0 Prolene sutures. The vessels were then back bled and flushed appropriately and then flow reestablished. There was a good Doppler signal throughout the entire vein patch and also proximal and distal to the interposition vein. Hemostasis was obtained in the wound. The patient did received a partial reversal of  heparin with 50 mg of protamine. Fifteen Blake drains were placed both in the groin incision and also in the neck incision. The neck incision was closed with a deep layer of 3-0 Vicryl. The platysma was closed with running 3-0 Vicryl and the skin was closed with a 4-0 subcuticular stitch. The groin incision was closed with a deep layer of 2-0 Vicryl, subcutaneous layer of 2-0 Vicryl and the skin closed with staples. A sterile dressing was applied. The patient tolerated the procedure well and was transferred to the recovery room in satisfactory condition. The patient awoke neurologically intact. DD:  12/30/99 TD:  12/30/99 Job: [id number]

## 2010-11-11 NOTE — Discharge Summary (Signed)
[facility]  Patient:    [name], [name]                      MRN: [id number] Adm. Date:  [id number] Disc. Date: [id number] Attending:  Bennye Alm Dictator:   Loura Pardon, P.A. CC:         Di Kindle. Edilia Bo, M.D.             Barry Dienes Eloise Harman, M.D.                           Discharge Summary  DATE OF BIRTH:  1933/08/27.  REFERRING PHYSICIAN:  Barry Dienes. Eloise Harman, M.D.  FINAL DIAGNOSIS: 1.  Restenosis of left carotid.  SECONDARY DIAGNOSIS: 1.  Known history of extracranial cerebrovascular occlusive disease status     post three prior carotid surgeries.     A.  Left carotid endarterectomy 1990.     B.  Replacement of left carotid artery with intraposition of greater         saphenous vein graft November 1997.     C.  dacryon patch angioplasty of intraposition graft June 2000. 2.  Type II diabetes mellitus insulin dependent. 3.  Hypertension. 4.  Hypercholesterolemia. 5.  Hypothyroidism. 6.  Gout. 7.  History of atherosclerotic coronary artery disease status post coronary     artery bypass graft surgery in 1997, Dr. Kathlee Nations Trigt. 8.  Decreased hearing acuity right ear. 9.  Status post left thoracotomy secondary to fungal infection. 10.  Status post lumbar laminectomy procedure December 30, 1999. 11.  Repair of recurrent left carotid stenosis, Dr. Waverly Ferrari.  Patient tolerated the procedure well and was transferred in stable satisfactory condition to the recovery room.  DISCHARGE DISPOSITION:  [name] was judged a suitable candidate for discharge on postoperative day number one.  He awoke in the recovery room with full baseline neurologic status.  He had no focal or regional neurologic deficits.  His swallow was not impaired.  His speech was not garbled.  He was moving upper and lower extremities appropriately, and he remained at this baseline status throughout his postoperative course.  He has remained  afebrile in the postoperative period, has not had nausea and vomiting.  His incision is healing nicely.  There was no evidence of erythema, drainage, or swelling.  He is taking oral nourishment well.  He has full gastrointestinal tract function.  The incision in the right groin is also healing nicely.  His mental status has been clear in the postoperative period.  His serum glucose has been tightly controlled with serum blood glucose taken every four hours.  He is ambulating independently.  DISCHARGE MEDICATIONS:  He goes home on the following medications: 1.  Percocet 5/325 1-2 tabs po q. 4-6 hours prn pain. 2.  Diabeta 5 mg 1 po b.i.d. 3.  Synthroid 175 micrograms daily. 4.  Triamterene hydrochlorothiazide 37.5/25 daily. 5.  Metoprolol 50 mg b.i.d. 6.  Lotensin 20 mg daily. 7.  Glucophage 850 mg b.i.d. 8.  Allopurinol 300 mg daily. 9.  Tiazac 360 mg daily. 10.  Lipitor 20 mg at bedtime. 11.  Norvasc 5 mg daily. 12.  Humulin N 18 units subcutaneously in the morning, 28 units subcutaneously      in the evening.  DISCHARGE DIET:  Low sodium, low cholesterol ADA diet.  WOUND CARE:  He may bathe daily beginning Sunday, July 8th.  He  is asked to sponge bathe until that time.  DISCHARGE ACTIVITY:  He may ambulate as tolerated.  He was asked not to drive for the next two weeks.  He will have an office visit with Dr. Edilia Bo Monday, August 1st, 2001 at 11:30 in the morning.  Wound care in the right groin consists of applying dressings daily for two days, using Hibiclens on the wound at the right groin area with each dressing change.  BRIEF HISTORY:  [name] is a 77 year old male referred by Dr. Eloise Harman for evaluation of recurrent right carotid artery disease.  The person was first seen for carotid artery stenosis on the left in 1990 and underwent a left carotid endarterectomy at that time.  There was a repeat procedure needed when the left carotid endarterectomy site  re-stenosed at that time.  In 1997 an intravasation of saphenous graft was placed.  The graft re-stenosed and a Dacryon patch angioplasty of the intraposition graft was performed in June of 2000.  On a recent follow up there was noted recurrent stenosis of the left carotid at the placement of the [facility] patch of about 80%.  The patient underwent angiography on June 30th, 2001 and a re-do left carotid endarterectomy was planned for July 6th, 2001.  HOSPITAL COURSE:  As essentially described in the discharge disposition. Patient has tolerated the procedure well after being admitted July 6th.  He remains at his baseline neurologic status with no focal or regional neurologic deficits.  He is afebrile in the postoperative period.  His incisions are healing nicely.  He is ambulating.  His mental status has remained clear.  He will go home with follow up with Dr. Edilia Bo in two weeks. DD:  12/30/99 TD:  12/31/99 Job: [id number]

## 2010-11-11 NOTE — H&P (Signed)
[facility]  Patient:    [name], [name]                        MRN: [id number] Adm. Date:  12/30/99 Attending:  Di Kindle. Edilia [name], M.D. Dictator:   [name], P.A. CC:         [name]. [name], M.D.                         History and Physical  DATE OF BIRTH:  [birth year].  REFERRING PHYSICIAN:  Dr. Barry [name]. [place].  CHIEF COMPLAINT:  Left carotid artery stenosis.  HISTORY OF PRESENT ILLNESS:  [age]-year-old white male, referred by Dr. Eloise [name] for evaluation of recurrent carotid artery disease.  The patient was first diagnosed with stenosis in 1990, undergoing a CEA which required repeat procedures, the last one in June 2000 when he was found to have in-graft stenosis.  On his visit on December 14, 1999, Dopplers revealed a tight right CAS and on the left, recurrent stenosis at the distal part of the Dacron patch.  The patient underwent an angiogram on December 24, 1999 (no records found in the office).  According to the patient, Dr. Di Kindle. Edilia [name] recommended that he undergo a redo left CEA with vein graft on December 30, 1999. Vein mapping was performed at the office on December 29, 1999 (please see the lab reports).  He denies any weakness, paresthesias, facial droop, difficulty standing or walking.  He denies any falling, impaired speech, headache, nausea, vomiting or vertigo.  No dizziness, blurred vision, amaurosis fugax or seizures.  No dysphagia, confusion or memory loss.  The rest of the review of systems is essentially negative.  PAST MEDICAL HISTORY:  1. IDDM.  2. Right decreased hearing.  3. Hypertension.  4. Hypercholesterolemia.  5. Hypothyroidism.  6. Gout.  7. CAD, status post CABG.  PAST SURGICAL HISTORY:  1. Status post left CEA, 1990, [name].  2. Status post arteriogram, [name], December 24, 1999.  3. Status post replacement of left carotid artery with an interposition SVG     in November 1997.  4.  Status post repeat endarterectomy with vein patch angioplasty in June 2000     secondary to within-graft stenosis.  5. Status post CABG x 3, 1997, Dr. Kathlee Nations Trigt [name].  6. Status post left thoracotomy secondary to fungal disease, remote.  7. Status post lumbar laminectomy.  MEDICATIONS:  1. Diabeta 5 mg p.o. b.i.d.  2. Synthroid 0.175 mg p.o. q.d.  3. Triamterene/HCTZ 37.5/25 mg.  4. Metoprolol 50 mg one p.o. q.d.  5. Lotensin 20 mg one p.o. q.d.  6. Allopurinol 300 mg p.o. q.d.  7. Glucophage 850 mg p.o. q.d.  8. Tiazac 360 mg one p.o. q.d.  9. Lipitor 20 mg one p.o. q.d. 10. Norvasc 5 mg one p.o. q.d. 11. Humulin ____, 18 units in a.m., 28 units p.m.  ALLERGIES:  MERTHIOLATE.  REVIEW OF SYSTEMS:  With the exception of that mentioned in HPI and past medical history, essentially negative.  FAMILY HISTORY:  Mother died of diabetes in 72.  Father died in 84 of heart disease.  One brother who died of diabetes, CVA at age 50.  SOCIAL HISTORY:  The patient is married.  He has two children and a third one that is dead.  He works as a Medical illustrator.  He denies any tobacco or alcohol intake.  His  wife cannot take care of him at this point, for she has recurrent breast cancer with poor prognosis.  PHYSICAL EXAMINATION:  GENERAL:  Well-developed, well-nourished 77 year old white male in no acute distress, alert and oriented x 3.  VITAL SIGNS:  Blood pressure 150/90 on the right, 160/90 on the left.  Pulse 64.  Respirations 18.  HEENT:  Head normocephalic, atraumatic.  PERRLA.  EOMI.  Funduscopic exam within normal limits.  NECK:  Supple.  No JVD.  Bilateral bruits.  No thyromegaly or lymphadenopathy.  CHEST:  Symmetrical on inspiration.  LUNGS:  Clear to auscultation bilaterally.  CARDIOVASCULAR:  Regular rate and rhythm, 1/6 systolic murmur.  No rubs or gallops.  ABDOMEN:  Soft, nontender.  Bowel sounds x 4.  No hepatosplenomegaly, masses palpable or abdominal  bruits.  GU:  Deferred.  RECTAL:  Deferred.  EXTREMITIES:  No clubbing, cyanosis, or edema.  SKIN:  No ulcerations, normal hair pattern, warm temperature.  PERIPHERAL PULSES:  Carotids 2+ bilaterally with audible bruits, femorals 2+ bilaterally with bruits, popliteal and dorsalis pedis and posterior tibialis 2+ bilaterally.  NEUROLOGIC:  Grossly normal.  Normal gait.  DTRs 2+ bilaterally.  Muscle strength 5/5.  ASSESSMENT AND PLAN:  [age]-year-old white male with multiple left carotid procedures with restenosis of the graft, who will undergo redo left carotid endarterectomy with vein graft by [name].  [name] has seen and evaluated this patient prior to the admission and has explained the risks and benefits involved in the procedure and he agreed to continue. DD:  12/29/99 TD:  12/30/99 Job: [id number]

## 2010-11-11 NOTE — Discharge Summary (Signed)
[place]  Patient:    [name], [name] Visit Number: [id number] MRN: [id number]          Service Type: MED Location: [phone] Attending Physician:  Ronaldo Miyamoto Dictated by:   Joellyn Rued, P.A.-C. Admit Date:  05/07/2001 Disc. Date: 05/08/01   CC:         Barry Dienes. Eloise Harman, M.D.  Mikey Bussing, M.D.   Discharge Summary  ADMITTING PHYSICIAN:  Dr. Dietrich Pates.  SUMMARY OF HISTORY:  [name] is a 77 year old white male, who was seen in the office on 05/02/01 by Dian Queen, P.A.-C.  He complained of shortness of breath that just lasted a few seconds; however, whenever he tried to do anything strenuous, he would have some slight chest tightness but without crescendo or symptoms.  He has been using some Viagra lately but his above symptoms occur without the use of Viagra.  He has not tried any nitroglycerin.  His history is notable for initial bypass grafting in 1997 with a re-do three-vessel bypass grafting on January 29, 2001.  He also has peripheral vascular disease with four prior left carotid endarterectomies since 1990 as well as a right carotid endarterectomy on January 29, 2001 in conjunction with his re-do bypass.  History of hyperlipidemia being followed by Dr. Eloise Harman. Hypertension.  Diabetes.  LABORATORY DATA:  Admission labs showed an H&H of 14.2 and 41.0.  Platelets 154, WBC 7.0, PT 14.0, PTT 29.  Sodium 140, potassium 3.7, BUN 17, creatinine 1.1, glucose 181.  CK total MB on admission was 206 and 8.8 with a troponin of .03, second was 170/7.6 with a troponin of 0.04, third was 144 with an MB of 5.8 and a troponin of 0.04.  EKGs that are on the chart show normal sinus rhythm, nonspecific ST-T wave changes.  HOSPITAL COURSE:  Mr. Brensinger evidently was referred from the office for an outpatient stress Cardiolite.  This was performed on 04/27/01 by Guy Franco. During the test, he exercised for nine minutes and  developed moderate dyspnea. He denied any chest discomfort; however, he developed inferolateral ST segment depression over approximately .5 mm during the exercise with worsening of his EKG changes into the recovery with persisting changes for approximately 20 minutes.  Dr. Dietrich Pates and Dr. Riley Kill were contacted, and they felt he should be admitted to the hospital.  He was admitted to 3700 overnight.  He did not have further shortness of breath.  He has not had any chest discomfort.  He was ambulating in the halls without difficulty.  Imaging from the Cardiolite was reviewed by Dr. Riley Kill, and it was felt that he did not have any ischemia or scarring.  There was noted to be mild septal hypokinesis with an EF of 60%. Dr. Riley Kill felt that his enzymes were slightly abnormal and recommended cardiac catheterization to the patient and his son.  The patient did not want to pursue that option and wanted to continue conservative management.  Thus Dr. Riley Kill felt that he could be discharged home with close follow-up.  DISCHARGE DIAGNOSES: 1. Dyspnea on exertion. 2. Stress Cardiolite on 05/07/01 with the previously-mentioned results and    associated abnormal CK-MBs. 3. History as previously.  DISPOSITION:  He is discharged home.  He is asked to continue his home medications.  DISCHARGE MEDICATIONS:  1. Coated aspirin 325 mg q.d.  2. Lipitor 40 mg q.h.s.  3. Multivitamin q.d.  4. Allopurinol 300 mg b.i.d.  5. Glyburide 5  mg b.i.d.  6. Maxzide q.d.  7. Synthroid 0.15 q.d.  8. Nexium 40 q.d.  9. Lopressor 50 mg b.i.d. 10. Norvasc 5 q.d. 11. Lotensin 20 b.i.d. 12. Glucophage 850 b.i.d.  He was given permission to return to work, maintain a low fat, low salt, low cholesterol ADA diet.  He will see Dr. Riley Kill next week on Thursday, November 21, at 3:30 p.m. Dictated by:   Joellyn Rued, P.A.-C. Attending Physician:  Ronaldo Miyamoto DD:  05/08/01 TD:  05/08/01 Job:  [id number]

## 2010-11-11 NOTE — H&P (Signed)
[name] NO.:  [id number]   MEDICAL RECORD NO.:  [id number]           PATIENT TYPE:   LOCATION:                               FACILITY:  [facility]   PHYSICIAN:  Vesta Mixer, M.D. DATE OF BIRTH:  [birth year]   DATE OF ADMISSION:  12/16/2004  DATE OF DISCHARGE:                                HISTORY & PHYSICAL   [name] is a middle aged gentleman with a history of coronary artery  disease.  He is status post coronary artery bypass grafting x 2 in 2000.  He  also has a history of diabetes mellitus, hypercholesterolemia,  hypothyroidism, and hypertension.  He is admitted after having some episodes  of chest pain and an abnormal stress Cardiolite study.  These episodes of  chest pain seem to occur with exertion.  The pain occurs in his mid chest  and radiates to his left shoulder.  It has been occurring with less and less  exertion.  It is associated with some shortness of breath.  He has not had  any nausea, vomiting, or diaphoresis.  He also denies any syncope.   CURRENT MEDICATIONS:  Synthroid 0.15 mg daily, Allopurinol 300 mg p.o.  b.i.d., Metoprolol 100 mg b.i.d., Nexium 20 mg daily, Clonidine 0.2 mg  b.i.d., Metformin 850 mg p.o. b.i.d., multi-vitamin once daily, aspirin 325  mg daily, Lantus insulin 40 units in the morning, NovoLog insulin 10 units  b.i.d., Lopid 600 mg b.i.d., Lotrel 10 mg/20 mg once daily, and  hydrochlorothiazide 25 mg daily.   ALLERGIES:  He is allergic to iodine which causes a rash.  He has tolerated  several heart catheterizations in the past without any problems.   PAST MEDICAL HISTORY:  1.  Coronary artery disease status post coronary artery bypass grafting in      1997 and again in 2000.  2.  History of left carotid endarterectomy.  3.  History of right carotid endarterectomy.  4.  History of shoulder surgery.  5.  History of insulin dependent diabetes mellitus.  6.  Hypercholesterolemia.  7.  Hypothyroidism.  8.   Gout.  9.  Hypertension.   SOCIAL HISTORY:  The patient works in Public affairs consultant.  He does not smoke.  He  does not drink alcohol.   FAMILY HISTORY:  His father died of myocardial infarction at age 53.  His  mother died at age 37 due to old age.   REVIEW OF SYMPTOMS:  His review of systems was reviewed and is essentially  negative.   PHYSICAL EXAMINATION:  GENERAL:  He is a middle aged gentleman in no acute distress.  He is alert  and oriented x 3.  His mood and affect are normal.  VITAL SIGNS:  Weight 207, blood pressure 151/90 with a heart rate of 66.  HEENT:  2+ pupils.  NECK:  Bilateral carotid endarterectomy scars.  He has a right carotid  bruit.  He has no JVD.  LUNGS:  Clear to auscultation.  HEART:  Regular rate, S1 and S2.  He has no murmurs.  ABDOMEN:  Good bowel sounds,  nontender.  EXTREMITIES:  No clubbing, cyanosis or edema.  NEUROLOGICAL:  Nonfocal.   LABORATORY DATA:  His stress Cardiolite study reveals inferolateral  ischemia.  His overall left ventricular systolic function is fairly normal  but there is inferolateral hypokinesis.   IMPRESSION:  [name] presents with a history of coronary artery disease.  It appears that he may have some ischemia in his inferolateral region.  We  have scheduled him for heart catheterization.  We have discussed the risks,  benefits, and options of heart catheterization.  He understands and agrees  to proceed.       PJN/MEDQ  D:  12/15/2004  T:  12/15/2004  Job:  [id number]   cc:   Barry Dienes. Eloise Harman, M.D.  [address]  [place]  [place] [address]  Fax: [phone]

## 2010-11-11 NOTE — Op Note (Signed)
[place]. [facility]  Patient:    [name], [name]                      MRN: [id number] Proc. Date: 01/29/01 Adm. Date:  [id number] Attending:  Mikey Bussing CC:         Randal Buba, MD  Leroy Sea., M.D.  Arturo Morton. Riley Kill, M.D. [facility]   Operative Report  PREOPERATIVE DIAGNOSIS:  Symptomatic coronary artery disease and asymptomatic right coronary artery stenosis.  POSTOPERATIVE DIAGNOSIS:  Symptomatic coronary artery disease and asymptomatic right coronary artery stenosis.  OPERATION PERFORMED:  Right carotid endarterectomy with Dacron patch angioplasty.  SURGEON:  Di Kindle. Edilia Bo, M.D.  ASSISTANT: 1. Mikey Bussing, M.D. 2. Maxwell Marion, RNFA  ANESTHESIA:  General.  INDICATIONS FOR PROCEDURE:  The patient is a 77 year old gentleman whom I had known from previous carotid surgery on the left side.  I had been following him with the right carotid stenosis which progressed to greater than 80% and we had recommended right carotid endarterectomy in order to lower his risk of stroke.  He had delayed this because of some family issues but now presented with symptomatic coronary artery disease.  Given the greater than 80% right carotid stenosis, it was felt that right carotid endarterectomy should be combined with redo coronary revascularization in order to lower his risk of perioperative stroke.  The procedure and potential complications were discussed with the patient preoperatively.  DESCRIPTION OF PROCEDURE:  The patient was taken to the operating room and prepped and draped with Hibiclens, given his Betadine allergy.  The incision was made along the anterior border of the sternocleidomastoid on the right and the dissection carried down to the common carotid artery which was dissected free and controlled with the Rumel tourniquet.  Next the facial vein was divided between 2-0 silk ties and the external and  internal carotid arteries were identified.  Of note, the patient had an inverted configuration here with the internal carotid artery medially.  There were multiple branches coming off the external carotid artery which was lateral.  I divided the superior thyroid artery in order to allow exposure of the internal carotid artery.  The hypoglossal nerve was identified and carefully dissected to allow mobilization as I would have to dissect above this level in order to get above the plaque which extended fairly high up the internal carotid artery.  The internal carotid artery above the plaque was soft and could be clamped.  The patient was then heparinized.  The clamps were then placed on the internal, then the external, then the common carotid artery then a longitudinal arteriotomy was made in the common carotid artery and this was extended to the plaque and to the normal internal carotid artery well above the hypoglossal nerve.  The 10 Bard shunt was placed into the internal carotid artery, back-bled and then placed into the common carotid artery and secured with the Rumel tourniquet.  Flow was re-established through the shunt.  Endarterectomy plane was established proximally and the plaque was sharply divided.  Eversion endarterectomy was performed to the external carotid artery.  distally, the plaque tapered nicely and no tacking sutures were required.  The vessel was irrigated with copious amounts of Dextran and all loose debris removed.  Next the Dacron patch was sewn in using continuous 6-0 Prolene suture.  Prior to completing the patch closure, the shunt was removed.  The vessel was back-bled and  flushed appropriately and anastomosis completed.  Flow was re-established first to the external carotid artery and then to the internal carotid artery. At the completion there was good Doppler flow distal to the patch and a good pulse.  Hemostasis was obtained in the wound and the wound was  closed loosely with plans to have this closed at the completion of a redo coronary artery bypass grafting. DD:  01/29/01 TD:  01/29/01 Job: [id number]

## 2010-11-11 NOTE — Consult Note (Signed)
[place]. [facility]  Patient:    [name], [name]                      MRN: [id number] Proc. Date: 01/12/01 Adm. Date:  [id number] Disc. Date: [id number] Attending:  Ronaldo Miyamoto CC:         CVTS office  [facility]  Leroy Sea., M.D.   Consultation Report  REASON FOR CONSULTATION:  Class IV progressive angina, recurrent coronary artery disease.  REQUESTING PHYSICIAN:  Arturo Morton. Riley Kill, M.D.  PRIMARY CARE PHYSICIAN:  Leroy Sea., M.D.  CHIEF COMPLAINT:  Chest pain.  HISTORY OF PRESENT ILLNESS:  I was asked to see Mr. [name] by Dr. Riley Kill and the [facility] cardiologists for evaluation and treatment of recurrent coronary artery disease.  The patient is a 77 year old white male insulin-dependent diabetic who underwent three vessel coronary artery bypass grafting in May 1997.  At that time saphenous vein grafts were placed to the OM1 and OM2 and a right mammary graft was placed to the distal RCA.  The patient did well until the past two weeks when he developed exertional chest pain when working out in the heat doing yard work and walking.  The episodes became more severe in intensity and more frequent in onset and at the time of presentation were not associated with exertion.  It was described as pressing substernal pain that would last over one hour and reduced in severity by nitroglycerin.  There was associated diaphoresis and some shortness of breath and increasing fatigability.  There was no nausea or vomiting.  The patient was admitted to the hospital on July 17 and was ruled out for MI.  He had been last seen at our office in late May by Dr. Cari Caraway for evaluation of severe right carotid artery stenosis documented by duplex ultrasound studies and had a recommendation for right carotid endarterectomy as soon as a right ear infection had cleared.  The patient was admitted and placed on heparin  and nitroglycerin and underwent cardiac catheterization by Dr. Chales Abrahams on July 18. This demonstrated a patent vein graft to OM1, an occluded vein graft to OM2 which was a small native vessel, atretic.  The mammary artery graft to the right coronary was a ______ due to atretic flow with a 50% stenosis of the main right coronary.  He had a proximal complex lesion of 70-80% of the LAD diagonal and overall normal left ventricular function with ejection fraction of 65%.  The left ventricular end-diastolic pressure was 18 mmHg.  Due to his complex coronary lesion which was not a favorable lesion for angioplasty, he was referred for redo surgical coronary revascularization.  Since the catheterization the patient has had no pain in the hospital and has been weaned off his heparin and nitroglycerin.  PAST MEDICAL HISTORY: 1. Insulin-dependent diabetes. 2. Right carotid stenosis greater than 80%. 3. Psoriasis. 4. GERD. 5. Menieres disease. 6. Gout and degenerative arthritis. 7. Hypothyroid.  PAST SURGICAL HISTORY:  Positive for left carotid endarterectomy which required revision on three additional occasions and coronary artery bypass grafting x 3 in 1997.  He also had a left thoracotomy and lobectomy in 1967 and has had appendectomy, lumbar laminectomy, right knee surgery, bunionectomy, and right ear surgery.  CURRENT MEDICATIONS:  1. Lotensin 20 mg p.o. b.i.d.  2. Nitroglycerin p.r.n.  3. Lopressor 50 mg b.i.d.  4. Maxzide 37.5/25 one q.d.  5. Glyburide 5 mg q.d.  6. Glucophage 850 mg b.i.d.  7. Allopurinol 300 mg q.d.  8. Nexium 40 mg q.d.  9. Lipitor 40 mg q.d. 10. Synthroid 0.15 mg q.d. 11. Norvasc 5 mg q.d. 12. Humulin N insulin 18 units q.a.m., 28 units q.p.m.  ALLERGIES:  METHYLATE.  SOCIAL HISTORY:  The patient is widowed after a 18 year marriage, but has a girlfriend.  He denies alcohol use or tobacco use.  Lives alone.  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  FAMILY HISTORY:  Positive for coronary artery disease with his father dying at age 8 of an MI and a brother died of a stroke at age 41.  REVIEW OF SYSTEMS:  Patient denies any recent weight change, fever, or other constitutional symptoms.  His vision has had no change.  His right ear infection has cleared after a course of Augmentin by his primary care physician.  His dizziness is also improved.  He does have some reduced hearing in the right ear.  He denies any symptoms of TIA or CVA.  He denies claudication, DVT, or ankle edema.  He denies any blood per rectum or change in bowel habits.  He denies nocturia, frequency, hesitancy, or hematuria.  He denies any bleeding diathesis or easy bruisability.  He denies any significant injuries, fractures, or fractured ribs.  Review of systems is otherwise negative.  PHYSICAL EXAMINATION  VITAL SIGNS:  Height 5 feet 10 inches, weight 208 pounds, blood pressure 160/80, pulse 72 and regular, respirations 18, temperature 37.1.  GENERAL:  This is a middle aged white male in his hospital room following catheterization in no acute distress.  HEENT:  Full EOMs.  Pharynx:  Clear.  Dentition under good repair.  NECK:  Supple with a significant left neck scar from the multiple carotid operations.  There are no palpable adenopathies, no thyromegaly, no carotid bruit.  Bilateral carotid pulses.  LYMPH:  No palpable adenopathy in the cervical, supraclavicular, or axillary regions.  THORACIC:  No deformity.  Clear lung fields bilaterally.  CARDIAC:  Regular rate and rhythm without S3, gallop, murmur, or rub.  ABDOMEN:  Soft, nontender without bruit or mass.  EXTREMITIES:  Without clubbing, tenderness, or edema.  Pulses 2+ in the radial, femoral, and pedal pulses.  There is no venous insufficiency of the  legs.  There is incisions for vein harvest at the right lower leg, left upper leg, and the right  groin.  There appears to be adequate saphenous vein in the left lower leg.  SKIN:  Warm, clear, and dry without rash or lesion.  He has a well healed left thoracotomy incision where he had the lobectomy for the fungal infection in the late 60s.  NEUROLOGIC:  Alert and oriented x 3 with full motor function.  RECTAL:  Deferred.  LABORATORIES:  I reviewed the coronary arteriograms with Dr. Riley Kill and agree with the recommendation for redo coronary artery bypass grafting.  IMPRESSION:  Since he has a severe right carotid stenosis and has been recommended for carotid endarterectomy previously this summer by Dr. Edilia Bo, I would recommend a combined coronary artery bypass grafting and carotid endarterectomy.  The goal of the coronary revascularization would be to place the left LIMA to the LAD and new vein grafts to the right coronary and diagonal.  The vein in the left lower leg appears to be adequate.  His overall ventricular function is good.  PLAN:  Discharge the patient for outpatient evaluation by Dr.  Edilia Bo and later outpatient admission for combined right carotid endarterectomy and coronary artery bypass grafting.  We will attempt to have the patient seen by Dr. Edilia Bo at his office on July 31 with the surgery tentatively scheduled for Tuesday, August 6. DD:  01/12/01 TD:  01/14/01 Job: [id number]

## 2010-11-11 NOTE — Op Note (Signed)
[place]. [facility]  Patient:    [name], [name]                      MRN: [id number] Proc. Date: 01/29/01 Adm. Date:  [id number] Attending:  Mikey Bussing                           Operative Report  PREOPERATIVE DIAGNOSIS:  Class 4 angina, severe recurrent coronary artery disease, 90% right coronary artery stenosis.  POSTOPERATIVE DIAGNOSIS:  Class 4 angina, severe recurrent coronary artery disease, 90% right coronary artery stenosis.  OPERATION PERFORMED:  Redo coronary artery bypass grafting x 3 (left internal mammary artery to left anterior descending, saphenous vein graft to diagonal, saphenous vein graft to distal right coronary artery).  SURGEON:  Mikey Bussing, M.D.  ASSISTANT:  Maxwell Marion, RNFA  ANESTHESIA:  General.  ANESTHESIOLOGIST:  Dr. Darlyn Read.  INDICATIONS FOR PROCEDURE:  The patient is a 77 year old diabetic who is status post coronary artery bypass grafting x 3 in 1997.  He was readmitted with symptoms of unstable angina and ruled out for myocardial infarction. Cardiac catheterization demonstrated a patent vein graft to the obtuse marginal, an occluded vein graft to a small circumflex marginal and an atretic mammary artery graft to the right coronary artery which had a proximal 50 to 60% stenosis.  The LAD, which was not grafted previously, had high grade 90% LAD diagonal stenosis and he was referred for surgical revascularization. He also had known right internal carotid artery stenosis of 90% and was recommended for a carotid endarterectomy in a combined procedure by Dr. Cari Caraway, who will dictate the operative note for the carotid endarterectomy in a separate document.  Prior to the operation, I examined the patient in the hospital and reviewed the results of his cardiac catheterization with the patient and family.  I discussed the indications and expected benefits of redo coronary artery  bypass grafting.  I discussed the major aspects of the operation including the location of the surgical incisions, the use of general anesthesia and cardiopulmonary bypass, and the expected hospital recovery period.  I reviewed the risks from the operation which were higher than the risks of an original operation and include the risks of bleeding, MI, infection, stroke and death. He understood the alternatives to surgical therapy for his recurrent coronary artery disease and agreed to proceed with the operation as planned under what I felt was informed consent.  OPERATIVE FINDINGS:  The vein was harvested from the left lower extremity and was of average to above average quality.  The mammary artery was harvested from the left chest which was obliterated with dense adhesions from a previous lobectomy in 1967 from a fungal lung infection.  The right atrium was heavily adherent to the old right mammary pedicle and right lung and cannulation of the right atrium was difficult.  The coronaries were calcified and had a diabetic pattern of disease.  The vein graft to the right was placed just distal to the previously placed mammary artery graft.  The pericardium and mediastinal fat was not adequate to cover the heart at the end of this operation.  DESCRIPTION OF PROCEDURE:  The patient was brought to the operating room and placed supine on the operating table where general anesthesia was induced under invasive hemodynamic monitoring.  The right neck, chest, abdomen and legs were prepped with Betadine and draped as a  sterile field.  A right neck incision was made by Dr. Edilia Bo and a right carotid endarterectomy was performed which will be dictated under a separate operative note.  This incision was then packed and four skin sutures were placed to reapproximate it. A sternotomy was then made and the saphenous vein was harvested from the left leg.  The oscillating saw was used on re-entry into the  mediastinum to avoid injury to the underlying vascular structures.  The mammary artery was harvested as a pedicle graft from its origin at the subclavian vessels and had excellent flow.  The sternal retractor was then placed and the mediastinum was dissected out of dense adhesions.  The saphenous vein was harvested, prepared for use.  Once the heart, aorta and right atrium were adequately dissected, the patient was given heparin and the ACT was documented as being therapeutic. Pursestrings were placed in the ascending aorta and right atrium.  The patient was cannulated and placed on bypass.  The remainder of the dissection was performed.  The coronaries were identified for grafting.  The mammary artery and vein grafts were prepared for the distal anastomoses and a cardioplegia cannula was placed.  The patient was cooled to 28 degrees and as the aortic crossclamp was applied, 500 cc of cold blood cardioplegia was delivered to the aortic root with immediate cardioplegic arrest.  Septal temperature dropped less than 15 degrees with an additional 200 cc of cold blood cardioplegia to the aortic root.  Topical iced saline slush was used to augment myocardial preservation and a pericardial insulator pad was used to protect the left phrenic nerve.  The distal coronary anastomoses were then performed.  The first distal anastomosis was to the diagonal.  This was a 1.5 mm vessel with proximal 90% stenosis.  A reversed saphenous vein was sewn end-to-side with running 7-0 Prolene.  The second distal anastomosis was to the distal right.  This was a 1.8 mm vessel with proximal 60% stenosis and a reversed saphenous vein was sewn end-to-side with a running 7-0 Prolene and there was good flow through the graft.  The third distal anastomosis was to the mid-LAD, which was a 1.5 mm vessel with proximal 90% stenosis.  The left internal mammary artery pedicle was brought through an opening in the left  pericardium and was brought down onto the LAD and sewn end-to-side with running 8-0 Prolene.  There was excellent flow through the anastomosis after briefly releasing the vascular  pedicle clamp on the mammary pedicle.  The pedicle clamp was then reapplied and the pedicle was secured with two 6-0 Prolenes.  Cardioplegia was redosed.  Two proximal vein anastomoses were placed on the ascending aorta while the aortic crossclamp was still in place.  These were performed with a 4.0 mm punch and running 6-0 Prolene.  The aortic crossclamp was then removed after air was vented from the heart and ascending aorta.  The heart resumed a rhythm and then was cardioverted back to a regular rhythm.  The patient was rewarmed and reperfused.  Temporary pacing wires were applied.  The proximal and distal anastomoses were checked and found to be hemostatic.  When the patient reached 37 degrees, the lungs were re-expanded and the ventilator was resumed.  He was weaned from bypass on renal dose dopamine with stable blood pressure and excellent cardiac output.  Protamine was administered.  The cannulas were removed.  There was a significant coagulopathy despite using the aprotinin protocol.  The patient was transfused with platelets  and two fresh frozen plasma units.  This improved coagulation.  The mediastinum was irrigated with warm antibiotic irrigation.  The leg incision was irrigated and closed in a standard fashion.  Two mediastinal and a left pleural chest tube were placed and brought out through separate incisions.  The sternum was reapproximated with interrupted steel wire.  The pectoralis fascia was closed with interrupted Vicryl.  The subcutaneous and skin were closed with running Vicryl.  Sterile dressings were applied.  The patient returned to the intensive care unit.  Prior to transferring the patient the neck was closed in layers using Vicryl and a Jackson-Pratt drain was placed in the neck  and secured with a skin stitch. DD:  01/29/01 TD:  01/30/01 Job: [id number]

## 2010-11-11 NOTE — Op Note (Signed)
NAMEMAINOR, HELLMANN               ACCOUNT NO.:  [id number]   MEDICAL RECORD NO.:  [id number]          PATIENT TYPE:  AMB   LOCATION:  ENDO                         FACILITY:  [facility]   PHYSICIAN:  Petra Kuba, M.D.    DATE OF BIRTH:  09/27/1933   DATE OF PROCEDURE:  11/10/2004  DATE OF DISCHARGE:                                 OPERATIVE REPORT   PROCEDURE:  Esophagogastroduodenoscopy.   INDICATIONS FOR PROCEDURE:  Atypical reflux symptoms not responding to the  usual medicines.  Consent was signed after risks, benefits, methods, and  options were thoroughly discussed in the office.   ADDITIONAL MEDICATIONS USED:  Demerol 60, Versed 6.   PROCEDURE:  The video endoscope was inserted by direct vision.  The  esophagus was normal.  He did have a tiny hiatal hernia.  The scope was  passed into the stomach and advanced through a antrum, normal pylorus, into  a normal duodenal bulb, around the C-loop to a normal second portion of the  duodenum.  The scope was withdrawn back to the bulb and a good look there  ruled out any ulcers there.  The scope was withdrawn back into the stomach  and retroflexed.  The cardia, fundus, angularis, lesser and greater curve  were normal except for some mild gastritis.  Straight visualization of the  stomach did not reveal any additional findings.  The air was suctioned, the  scope was slowly withdrawn.  Again, a good look at the esophagus was normal.  The scope was removed.  The patient tolerated the procedure well, there was  no obvious immediate complication.   ENDOSCOPIC DIAGNOSIS:  1.  Tiny hiatal hernia.  2.  Minimal gastritis.  3.  Otherwise, normal EGD.   PLAN:  Continue Zegren since it seems to be helping, 1-2 a day on an empty  stomach, follow up p.r.n. or in 2-3 months to recheck symptoms and make sure  no further workup plans are needed.      MEM/MEDQ  D:  11/10/2004  T:  11/10/2004  Job:  [id number]   cc:   Barry Dienes. Eloise Harman, M.D.  [address]  [place]  [place] [address]  Fax: [phone]

## 2010-11-11 NOTE — Consult Note (Signed)
[place]. [facility]  Patient:    [name], [name]                      MRN: [id number] Adm. Date:  [id number] Disc. Date: [id number] Attending:  Ronaldo Miyamoto                          Consultation Report  NO DICTATION DD:  01/12/01 TD:  01/14/01 Job: [id number]

## 2010-11-11 NOTE — Procedures (Signed)
[place]  Patient:    [name], [name] Visit Number: [id number] MRN: [id number]          Service Type: END Location: ENDO Attending Physician:  Nelda Marseille Dictated by:   Petra Kuba, M.D. Proc. Date: 10/07/01 Admit Date:  10/07/2001   CC:         Barry Dienes. Eloise Harman, M.D.   Procedure Report  PROCEDURE:  Colonoscopy.  INDICATION:  Patient with chronic right-sided abdominal pain, due for colonic screening.  Consent was signed after risks, benefits, methods, and options thoroughly discussed with both him and his son in the office.  MEDICATIONS:  Demerol 70 mg, Versed 8 mg.  DESCRIPTION OF PROCEDURE:  Rectal inspection was pertinent for external hemorrhoids, small.  Digital exam was negative.  Video colonoscope was inserted, easily advanced around the colon to the cecum.  This did require rolling him on his back and some abdominal pressure.  No obvious abnormality was seen on insertion.  The cecum was identified by the appendiceal orifice and ileocecal valve.  In fact, the scope was inserted a very short way into the terminal ileum, which was normal.  Photo documentation was obtained.  The prep in the right side was only fair.  There was stool adherent to the wall which could not be washed off, but no obvious mass lesions or large abnormalities were seen.  The scope was slowly withdrawn.  The rest of the prep was adequate.  It did require some washing and suctioning throughout, but on slow withdrawal through the colon, no other abnormalities were seen as we slowly withdrew back to the rectum.  Once back in the rectum the scope was retroflexed, pertinent for some internal hemorrhoids and some trauma from the scope.  The scope was straightened and anorectal pull-through confirmed the above.  The scope was reinserted a short way up the left side of the colon, air was suctioned, and scope removed.  The patient tolerated the  procedure well.  There was no obvious immediate complication but the hemorrhoidal trauma from the scope.  ENDOSCOPIC DIAGNOSES: 1. Internal-external hemorrhoids with some scope trauma. 2. Otherwise within normal limits to the terminal ileum with a fair prep in    the cecum and proximal ascending, small lesions could have been missed    there.  PLAN:  Follow up p.r.n. or in two to three months to recheck symptoms, make sure no further workup plans are needed like possibly a CT next, or could leave that to Dr. Eloise Harman.  Repeat screening in five to 10 years, otherwise yearly rectals and guaiacs per Dr. Eloise Harman. Dictated by:   Petra Kuba, M.D. Attending Physician:  Nelda Marseille DD:  10/07/01 TD:  10/07/01 Job: [id number]

## 2010-11-11 NOTE — H&P (Signed)
[place]. [facility]  Patient:    [name], [name]                      MRN: [id number] Adm. Date:  [id number] Disc. Date: [id number] Attending:  Ronaldo Miyamoto Dictator:   Loura Pardon, P.A. CC:         Dr. Edilia Bo   History and Physical  DATE OF BIRTH: 1934/05/02  PRIMARY CARE PHYSICIAN: Dr. Jarome Matin.  CARDIOLOGIST: Arturo Morton. Riley Kill, M.D.  CHIEF COMPLAINT: "Im here for both heart and neck surgery."  HISTORY OF PRESENT ILLNESS: This is a 77 year old male, referred by Dr. Riley Kill for evaluation of recurrent, unstable angina.  [name] originally had coronary artery bypass graft surgery in 1997 with placement of three grafts, a RIMA to the right coronary artery, saphenous vein graft to the first obtuse marginal, and saphenous vein graft to the second obtuse marginal.  The patient describes a recent history of accelerating angina attacks which at first same on with exertion and then progressed to rest pain.  He presented to [facility] Emergency Room and was seen there by Dr. Riley Kill.  He underwent left heart catheterization on January 10, 2001 and the vein graft to the second obtuse marginal was occluded, the graft to the first obtuse marginal was patent, the right internal mammary artery had been used for the right coronary artery and is now atretic.  He was seen by Dr. Donata Clay, who recommended re-do coronary artery bypass graft surgery, placement of a left internal mammary artery to the LAD, saphenous vein graft to the diagonal, and saphenous vein graft to the right coronary artery using saphenous vein graft harvested from the left lower extremity.  He has also been followed for an asymptomatic greater than 80% right internal carotid artery stenosis in the setting of extracranial cerebrovascular occlusive disease.  The patient has had four distinct surgeries to the left internal carotid artery for recurrent stenosis.   The patient has no prior history of cerebrovascular accident, no symptoms of transient ischemic attacks, no aphasia, no amaurosis fugax, no unilateral weakness.  He presents for combined surgery of re-do coronary artery bypass graft surgery and right carotid endarterectomy.  This is scheduled for January 29, 2001.  ALLERGIES: BETADINE/METHIOLADE gives a rash.  PAST MEDICAL HISTORY:  1. History of atherosclerotic coronary artery disease.  2. Hypertension.  3. Hypercholesterolemia.  4. Hypothyroidism.  5. Two-pillow orthopnea.  6. Extracranial cerebrovascular occlusive disease with recurrent left     internal carotid artery stenosis and current greater than 80% right     internal carotid artery stenosis.  7. Type 2 diabetes mellitus.  8. Gout.  9. Psoriasis. 10. GERD. 11. Menieres disease. 12. Degenerative joint disease.  PAST SURGICAL HISTORY:  1. Status post left heart catheterization, most recently January 10, 2001 by     Dr. Chales Abrahams.  2. Status post coronary artery bypass graft surgery in 1997 by Dr. Donata Clay.  3. Status post left thoracotomy secondary to fungal infection.  4. Status post lumbar laminectomy December 30, 1999.  5. Status post left carotid endarterectomy in 1990.  6. Status post first re-do of left carotid endarterectomy in 1997 with     interposition vein graft placed.  7. Status post second re-do left internal carotid artery in June 2001 with     Dacron patch angioplasty.  8. Status post third re-do of left carotid endarterectomy with placement of  interposition saphenous vein graft harvested from right thigh in July 2001     by Dr. Waverly Ferrari.  MEDICATIONS:  1. Triamterene/hydrochlorothiazide 37.5/25 mg q.d.  2. Allopurinol 300 mg p.o. q.d.  3. Synthroid 150 mcg q.d.  4. Glyburide 5 mg p.o. b.i.d.  5. Metoprolol 50 mg p.o. b.i.d.  7. Lotensin 20 mg p.o. b.i.d.  8. Nexium 40 mg p.o. q.d.  9. Norvasc 5 mg q.d. at bedtime. 10. Glucophage 850 mg p.o.  b.i.d. 11. Lipitor 40 mg q.d. at bedtime. 12. Humulin N 18 units subcutaneously in the morning, 28 units subcutaneously     in the evening. 13. Centrum multivitamin q.d. 14. Enteric-coated aspirin 325 mg q.d. 15. Vitamin E 400 IU q.d. 16. Sublingual nitroglycerin 0.4 mg sublingual q.82minutes x 3 as needed for     chest pain. 17. Isosorbide mononitrate 30 mg p.o. q.d.  REVIEW OF SYSTEMS: NEUROLOGIC: The patient has no focal neurologic deficits, although he had a recent past history of dizziness and unsteady gait.  He also had right-sided headaches, apparently having a history of ear infections which do clear with administration of antibiotics.  He denies any history of transient ischemic attacks, expressive or receptive aphasia, or amaurosis fugax.  CARDIOVASCULAR: The patient does have symptoms of accelerating angina. He is currently on isosorbide mononitrate.  His angina attacks do not radiate and are not accompanied by nausea, vomiting, or diaphoresis, and are controlled with isosorbide mononitrate taken on a daily basis.  This was started at the time of left heart catheterization on January 10, 2001.  GI: Ongoing GERD.  No history of emesis or GI bleeding.  PULMONARY: No prior history of hemoptysis, recurrent cough with sputum production.  SOCIAL HISTORY: The patient is most recently widowed, as of December 2001. His wife fought a 16-1/2 year battle with breast cancer.  He has three grown children, one of whom is deceased.  He is currently employed at [employer] and has worked there for 23 years.  He has never smoked.  He does not partake of alcoholic beverages.  FAMILY HISTORY: Father died at age 19 of heart disease.  Mother died at age 46 of complications secondary to diabetes.  He has three sisters who are in good health.  He has five brothers, four of whom are in good health.  One brother died at age 39 of myocardial infarction or cerebrovascular accident; this is unknown;  he did have diabetes.  PHYSICAL EXAMINATION:   GENERAL: This is an alert and oriented male, well-nourished, in no acute distress, 77 years old.  VITAL SIGNS: Blood pressure in left upper extremity 124/72, pulse 78, respirations 18.  The patient is afebrile.  HEENT: Eyes, PERRL.  EOMI.  The patient does wear an upper denture.  He has no history of glaucoma, cataracts, or macular degeneration.  NECK: Supple.  No jugular venous distention; however, there are bruits auscultated both on the right and left, the right greater than the left. There is a well-healed cicatrix of the left neck from prior carotid surgery.  LUNGS: Clear to auscultation and percussion bilaterally.  No wheezes, rhonchi, or rales.  Good air exchanges.  HEART: Regular rate and rhythm without murmurs, rubs, or gallops.  ABDOMEN: Soft, nondistended.  Bowel sounds present throughout.  Abdominal aorta nonexpansile, nonpulsatile.  EXTREMITIES: Presents with no evidence of clubbing or cyanosis.  No varicosities of lower extremities.  No ischemic ulcerations.  He has palpable pedal pulses bilaterally.  There is evidence of saphenous vein graft  harvesting on the right lower extremity and also a well-healed cicatrix in the left groin where interposition graft for carotid surgery was taken.  NEUROLOGIC: Gait is steady.  Patellar reflexes are 2+ bilaterally.  He has equivalent muscle strength in both upper and lower extremities.  IMPRESSION: The patient is for coronary artery bypass graft surgery, re-do, and right carotid endarterectomy by Dr. Kathlee Nations Trigt and Dr. Waverly Ferrari.  This is to be done on January 29, 2001 at [facility].DD:  01/25/01 TD:  01/26/01 Job: [id number]

## 2011-03-17 LAB — POCT I-STAT 4, (NA,K, GLUC, HGB,HCT)
Operator id: 268271
Sodium: 134 — ABNORMAL LOW

## 2011-03-23 LAB — COMPREHENSIVE METABOLIC PANEL
ALT: 28
AST: 25
Albumin: 3.8
Calcium: 8.5
Chloride: 106
Creatinine, Ser: 1.62 — ABNORMAL HIGH
GFR calc Af Amer: 51 — ABNORMAL LOW
Sodium: 137
Total Bilirubin: 0.9

## 2011-03-23 LAB — POCT CARDIAC MARKERS
CKMB, poc: 1.4
CKMB, poc: 2.8
Myoglobin, poc: 102
Operator id: 146091
Operator id: 151321
Troponin i, poc: <0.05

## 2011-03-23 LAB — TSH: TSH: 0.285 — ABNORMAL LOW

## 2011-03-23 LAB — BASIC METABOLIC PANEL
CO2: 24
Calcium: 8.6
Creatinine, Ser: 1.46
GFR calc Af Amer: 58 — ABNORMAL LOW
GFR calc non Af Amer: 48 — ABNORMAL LOW

## 2011-03-23 LAB — DIFFERENTIAL
Basophils Absolute: 0
Basophils Relative: 1
Eosinophils Absolute: 0.2
Eosinophils Relative: 2
Eosinophils Relative: 3
Lymphocytes Relative: 12
Lymphs Abs: 1.1
Monocytes Absolute: 0.3
Monocytes Absolute: 0.5
Monocytes Relative: 5

## 2011-03-23 LAB — POCT I-STAT, CHEM 8
BUN: 33 — ABNORMAL HIGH
Calcium, Ion: 1.08 — ABNORMAL LOW
Chloride: 104
Creatinine, Ser: 1.5
HCT: 37 — ABNORMAL LOW
Hemoglobin: 12.6 — ABNORMAL LOW
Potassium: 4.8
Sodium: 133 — ABNORMAL LOW
TCO2: 22
TCO2: 24

## 2011-03-23 LAB — CBC
HCT: 38.3 — ABNORMAL LOW
Hemoglobin: 13.4
MCHC: 35
MCV: 92.2
Platelets: 156
RBC: 3.94 — ABNORMAL LOW
RBC: 4.18 — ABNORMAL LOW
RDW: 15.4
WBC: 9

## 2011-03-23 LAB — CK TOTAL AND CKMB (NOT AT ARMC)
Relative Index: INVALID
Total CK: 89

## 2011-03-23 LAB — APTT: aPTT: 28

## 2011-03-23 LAB — URINALYSIS, ROUTINE W REFLEX MICROSCOPIC
Hgb urine dipstick: NEGATIVE
Ketones, ur: 15 — AB
Nitrite: NEGATIVE
Urobilinogen, UA: 1
pH: 5

## 2011-03-23 LAB — URINE MICROSCOPIC-ADD ON

## 2011-03-24 LAB — DIFFERENTIAL
Basophils Absolute: 0
Eosinophils Relative: 1
Lymphocytes Relative: 11 — ABNORMAL LOW
Monocytes Absolute: 0.2

## 2011-03-24 LAB — URINALYSIS, ROUTINE W REFLEX MICROSCOPIC
Specific Gravity, Urine: 1.024
Urobilinogen, UA: 1

## 2011-03-24 LAB — POCT CARDIAC MARKERS
CKMB, poc: 1.3
Troponin i, poc: <0.05

## 2011-03-24 LAB — POCT I-STAT, CHEM 8
BUN: 37 — ABNORMAL HIGH
Calcium, Ion: 1.1 — ABNORMAL LOW
Creatinine, Ser: 1.8 — ABNORMAL HIGH
TCO2: 26

## 2011-03-24 LAB — HEPATIC FUNCTION PANEL
Alkaline Phosphatase: 72
Bilirubin, Direct: 0.2
Indirect Bilirubin: 0.8
Total Bilirubin: 1
Total Protein: 6.5

## 2011-03-24 LAB — URINE MICROSCOPIC-ADD ON

## 2011-03-24 LAB — CBC
HCT: 40.1
Hemoglobin: 13.4
RDW: 15.2

## 2011-03-24 LAB — LIPASE, BLOOD: Lipase: 12

## 2011-04-03 LAB — POCT I-STAT CREATININE
Creatinine, Ser: 1.6 — ABNORMAL HIGH
Operator id: 103831

## 2011-04-03 LAB — POCT I-STAT GLUCOSE: Operator id: 103831

## 2011-04-27 ENCOUNTER — Encounter: Payer: Self-pay | Admitting: Cardiovascular Disease

## 2011-04-27 ENCOUNTER — Ambulatory Visit (INDEPENDENT_AMBULATORY_CARE_PROVIDER_SITE_OTHER): Payer: Medicare Other | Admitting: Cardiovascular Disease

## 2011-04-27 VITALS — BP 113/66 | HR 69 | Ht 70.0 in | Wt 194.8 lb

## 2011-04-27 DIAGNOSIS — R079 Chest pain, unspecified: Secondary | ICD-10-CM

## 2011-04-27 DIAGNOSIS — I2510 Atherosclerotic heart disease of native coronary artery without angina pectoris: Secondary | ICD-10-CM

## 2011-04-27 DIAGNOSIS — I2589 Other forms of chronic ischemic heart disease: Secondary | ICD-10-CM

## 2011-04-27 NOTE — Assessment & Plan Note (Signed)
Mistrusts presents with some episodes of chest discomfort. He has a history of severe coronary artery disease and has had coronary artery bypass grafting twice. He also has a history of carotid artery disease.  Because of this chest pain we will refer him for a Lexiscan Myoview study. Will see him back in the office in several months for followup visit.

## 2011-04-27 NOTE — Progress Notes (Signed)
[name] Date of Birth  1934/02/13 [facility] [address]    [address] [place], [place]  [address] [phone]  Fax  [phone]  History of Present Illness:  77 year old gentleman with a history of coronary artery disease. He status post coronary artery bypass grafting x2. His last bypass graft surgery was in 2000.  He describes a recent episodes of chest pain and tightness. He also has been having some shortness of breath. This is particularly worse when he is exercising or doing housework.  The pain is relieved with rest.  Current Outpatient Prescriptions on File Prior to Visit  Medication Sig Dispense Refill  . allopurinol (ZYLOPRIM) 300 MG tablet Take 1 tablet (300 mg total) by mouth daily.  30 tablet  11  . amLODipine-benazepril (LOTREL) 10-20 MG per capsule Take 1 capsule by mouth daily.  30 capsule  11  . aspirin 325 MG tablet Take 1 tablet (325 mg total) by mouth daily.  30 tablet  11  . cloNIDine (CATAPRES) 0.2 MG tablet Take 1 tablet (0.2 mg total) by mouth 2 (two) times daily.  60 tablet  11  . clopidogrel (PLAVIX) 75 MG tablet Take 1 tablet (75 mg total) by mouth daily.  30 tablet  11  . insulin aspart (NOVOLOG) 100 UNIT/ML injection Use as directed  10 mL  12  . insulin glargine (LANTUS) 100 UNIT/ML injection Use as directed   10 mL  12  . isosorbide mononitrate (IMDUR) 60 MG 24 hr tablet Take 1 tablet (60 mg total) by mouth daily.  90 tablet  11  . levothyroxine (SYNTHROID) 150 MCG tablet Take 1 tablet (150 mcg total) by mouth daily.  30 tablet  11  . metFORMIN (GLUCOPHAGE) 850 MG tablet Take 1 tablet (850 mg total) by mouth 2 (two) times daily with a meal.  60 tablet  11  . Multiple Vitamins-Minerals (MULTIVITAL PLATINUM) TABS One po daily   30 each  0  . nitroGLYCERIN (NITROSTAT) 0.4 MG SL tablet Place 1 tablet (0.4 mg total) under the tongue every 5 (five) minutes as needed for chest pain.  90 tablet  12  . ranitidine (ZANTAC) 300 MG tablet Take 1  tablet (300 mg total) by mouth at bedtime.  30 tablet  11  . ranolazine (RANEXA) 500 MG 12 hr tablet Take 1 tablet (500 mg total) by mouth 2 (two) times daily.  60 tablet  5    Allergies  Allergen Reactions  . Betadine (Povidone Iodine)   . Iodinated Diagnostic Agents Rash    Does fine with premedications for cath    Past Medical History  Diagnosis Date  . Hypertension   . Hyperlipidemia   . Thyroid disease   . CAD (coronary artery disease)   . Type 2 diabetes mellitus      with fair control  . Hypothyroidism   . Generalized weakness     without overt findings  . Dehydration     Mild dehydration, resolved  . Peripheral vascular disease   . Carotid disease, bilateral     with multiple bilateral carotid surgeries  . Psoriasis   . Meniere's disease   . Degenerative joint disease   . Gout   . Orthopnea     Two-pillow  . Unstable angina     Past Surgical History  Procedure Date  . Coronary artery bypass graft 1997 and 2000  . Cardiac catheterization   . Carotid endarterectomy   . Lung removal, partial   .  Laminectomies July 2001    lumbar laminectomies  . Thoracotomy      left--due to fungal infection  . Shoulder surgery     History  Smoking status  . Never Smoker   Smokeless tobacco  . Not on file    History  Alcohol Use No    Family History  Problem Relation Age of Onset  . Heart attack Father   . Diabetes Mother   . Heart disease Brother     Reviw of Systems:  Reviewed in the HPI.  All other systems are negative.  Physical Exam: BP 113/66  Pulse 69  Ht 5\' 10"  (1.778 m)  Wt 194 lb 12.8 oz (88.361 kg)  BMI 27.95 kg/m2 The patient is alert and oriented x 3.  The mood and affect are normal.   Skin: warm and dry.  Color is normal.    HEENT:   [place]/AT, Bilateral CEA scars, 2+ carotids, no bruits,  Lungs: clear   Heart: RR, 1-2 / 6 Systolic murmur    Abdomen: non tender  Extremities:  No c/c/e  Neuro:  Non focal    ECG: Normal sinus  rhythm.  He has nonspecific ST and T-wave changes.   Assessment / Plan:

## 2011-04-27 NOTE — Assessment & Plan Note (Signed)
[name] presents with some exertional chest pain. It is not clear whether these are due to angina. He states this is not similar to his previous episodes of angina.  We'll schedule him for a stress Myoview study for further evaluation. He has nitroglycerin to take if needed.

## 2011-04-27 NOTE — Patient Instructions (Signed)
Your physician wants you to follow-up in: 3 months  You will receive a reminder letter in the mail two months in advance. If you don't receive a letter, please call our office to schedule the follow-up appointment.  Your physician has requested that you have a lexiscan myoview. For further information please visit [email or web address] Please follow instruction sheet, as given.   Your physician recommends that you return for a FASTING lipid profile: 3 months

## 2011-05-09 ENCOUNTER — Ambulatory Visit (HOSPITAL_COMMUNITY): Payer: Medicare Other | Attending: Cardiology | Admitting: Radiology

## 2011-05-09 DIAGNOSIS — R079 Chest pain, unspecified: Secondary | ICD-10-CM

## 2011-05-09 DIAGNOSIS — I10 Essential (primary) hypertension: Secondary | ICD-10-CM | POA: Insufficient documentation

## 2011-05-09 DIAGNOSIS — E119 Type 2 diabetes mellitus without complications: Secondary | ICD-10-CM | POA: Insufficient documentation

## 2011-05-09 DIAGNOSIS — I2510 Atherosclerotic heart disease of native coronary artery without angina pectoris: Secondary | ICD-10-CM

## 2011-05-09 DIAGNOSIS — E785 Hyperlipidemia, unspecified: Secondary | ICD-10-CM | POA: Insufficient documentation

## 2011-05-09 DIAGNOSIS — Z951 Presence of aortocoronary bypass graft: Secondary | ICD-10-CM | POA: Insufficient documentation

## 2011-05-09 DIAGNOSIS — I779 Disorder of arteries and arterioles, unspecified: Secondary | ICD-10-CM | POA: Insufficient documentation

## 2011-05-09 DIAGNOSIS — I739 Peripheral vascular disease, unspecified: Secondary | ICD-10-CM | POA: Insufficient documentation

## 2011-05-09 DIAGNOSIS — Z8249 Family history of ischemic heart disease and other diseases of the circulatory system: Secondary | ICD-10-CM | POA: Insufficient documentation

## 2011-05-09 DIAGNOSIS — I2589 Other forms of chronic ischemic heart disease: Secondary | ICD-10-CM

## 2011-05-09 DIAGNOSIS — R0609 Other forms of dyspnea: Secondary | ICD-10-CM | POA: Insufficient documentation

## 2011-05-09 DIAGNOSIS — R000 Tachycardia, unspecified: Secondary | ICD-10-CM | POA: Insufficient documentation

## 2011-05-09 DIAGNOSIS — R0989 Other specified symptoms and signs involving the circulatory and respiratory systems: Secondary | ICD-10-CM | POA: Insufficient documentation

## 2011-05-09 DIAGNOSIS — Z794 Long term (current) use of insulin: Secondary | ICD-10-CM | POA: Insufficient documentation

## 2011-05-09 DIAGNOSIS — R0789 Other chest pain: Secondary | ICD-10-CM | POA: Insufficient documentation

## 2011-05-09 DIAGNOSIS — R5381 Other malaise: Secondary | ICD-10-CM | POA: Insufficient documentation

## 2011-05-09 MED ORDER — TECHNETIUM TC 99M TETROFOSMIN IV KIT
33.0000 | PACK | Freq: Once | INTRAVENOUS | Status: AC | PRN
  Administered 2011-05-09: 33 via INTRAVENOUS

## 2011-05-09 MED ORDER — TECHNETIUM TC 99M TETROFOSMIN IV KIT
11.0000 | PACK | Freq: Once | INTRAVENOUS | Status: AC | PRN
  Administered 2011-05-09: 11 via INTRAVENOUS

## 2011-05-09 MED ORDER — REGADENOSON 0.4 MG/5ML IV SOLN
0.4000 mg | Freq: Once | INTRAVENOUS | Status: AC
  Administered 2011-05-09: 0.4 mg via INTRAVENOUS

## 2011-05-09 NOTE — Progress Notes (Signed)
[facility] SITE 3 NUCLEAR MED [address] [place] [address] [phone]  Cardiology Nuclear Med Study  [name] is a 77 y.o. male [id number] 1933/07/14   Nuclear Med Background Indication for Stress Test:  Evaluation for Ischemia and Graft Patency History: 97 CABGx3 & 02 Redo CABG,10 Echo:EF=55-60%,08 Heart Catheterization:patent grafts and 02 Myocardial Perfusion Study:Inferolateral ischemia EF=65% Cardiac Risk Factors: Carotid Disease, Family History - CAD, Hypertension, IDDM Type 2, Lipids and PVD  Symptoms:  Chest Pain,Pressure and Tightness with Exertion (last date  3 weeks ago), DOE, Fatigue and Rapid HR   Nuclear Pre-Procedure Caffeine/Decaff Intake:  None NPO After: 9:00pm   Lungs:  clear IV 0.9% NS with Angio Cath:  20g  IV Site: R Forearm  IV Started by:  Stanton Kidney, EMT-P  Chest Size (in):  46 Cup Size: n/a  Height: 5\' 10"  (1.778 m)  Weight:  193 lb (87.544 kg)  BMI:  Body mass index is 27.69 kg/(m^2). Tech Comments:  CBG= 184 @ 7:30am, per patient.    Nuclear Med Study 1 or 2 day study: 1 day  Stress Test Type:  Eugenie Birks  Reading MD: Olga Millers, MD  Order Authorizing Provider:  Jannette Spanner  Resting Radionuclide: Technetium 26m Tetrofosmin  Resting Radionuclide Dose: 11.0 mCi   Stress Radionuclide:  Technetium 3m Tetrofosmin  Stress Radionuclide Dose: 33.0 mCi           Stress Protocol Rest HR: 60 Stress HR: 67  Rest BP: 142/72 Stress BP: 145/73  Exercise Time (min): n/a METS: n/a   Predicted Max HR: 145 bpm % Max HR: 46.21 bpm Rate Pressure Product: 9715   Dose of Adenosine (mg):  n/a Dose of Lexiscan: 0.4 mg  Dose of Atropine (mg): n/a Dose of Dobutamine: n/a mcg/kg/min (at max HR)  Stress Test Technologist: Cathlyn Parsons, RN  Nuclear Technologist:  Doyne Keel, CNMT     Rest Procedure:  Myocardial perfusion imaging was performed at rest 45 minutes following the intravenous administration of Technetium  40m Tetrofosmin. Rest ECG: NSR with Incomplete RBBB  Stress Procedure:  The patient received IV Lexiscan 0.4 mg over 15-seconds.  Technetium 13m Tetrofosmin injected at 30-seconds.  There were no significant changes with Lexiscan. Patient developed chest pain 5/10 in recovery post infusion. Chest pain relieved after 7 minutes. Patient had no ectopy. Quantitative spect images were obtained after a 45 minute delay. Stress ECG: No significant ST segment change suggestive of ischemia.  QPS Raw Data Images:  Acquisition technically good; normal left ventricular size. Stress Images:  There is decreased uptake in the mid/basal lateral wall. Rest Images:  There is decreased uptake in the inferobasal wall. Subtraction (SDS):  These findings are consistent with mild lateral ischemia. Transient Ischemic Dilatation (Normal <1.22):  1.11 Lung/Heart Ratio (Normal <0.45):  0.27  Quantitative Gated Spect Images QGS EDV:  91 ml QGS ESV:  32 ml QGS cine images:  NL LV Function; NL Wall Motion QGS EF: 65%  Impression Exercise Capacity:  Lexiscan with no exercise. BP Response:  Normal blood pressure response. Clinical Symptoms:  There is chest pain. ECG Impression:  No significant ST segment change suggestive of ischemia. Comparison with Prior Nuclear Study: No images to compare  Overall Impression:  Low risk stress nuclear study with a small reversible defect in the mid/basal lateral wall c/w mild ischemia.   Olga Millers

## 2011-05-17 ENCOUNTER — Telehealth: Payer: Self-pay | Admitting: *Deleted

## 2011-05-17 ENCOUNTER — Encounter: Payer: Self-pay | Admitting: Cardiovascular Disease

## 2011-05-17 ENCOUNTER — Ambulatory Visit (INDEPENDENT_AMBULATORY_CARE_PROVIDER_SITE_OTHER): Payer: Medicare Other | Admitting: Cardiovascular Disease

## 2011-05-17 ENCOUNTER — Encounter: Payer: Self-pay | Admitting: *Deleted

## 2011-05-17 VITALS — BP 106/52 | HR 74 | Ht 70.0 in | Wt 197.4 lb

## 2011-05-17 DIAGNOSIS — R0602 Shortness of breath: Secondary | ICD-10-CM

## 2011-05-17 DIAGNOSIS — N289 Disorder of kidney and ureter, unspecified: Secondary | ICD-10-CM

## 2011-05-17 DIAGNOSIS — R079 Chest pain, unspecified: Secondary | ICD-10-CM

## 2011-05-17 DIAGNOSIS — I10 Essential (primary) hypertension: Secondary | ICD-10-CM

## 2011-05-17 DIAGNOSIS — I2589 Other forms of chronic ischemic heart disease: Secondary | ICD-10-CM

## 2011-05-17 LAB — PROTIME-INR
INR: 1 ratio (ref 0.8–1.0)
Prothrombin Time: 11 s (ref 10.2–12.4)

## 2011-05-17 LAB — BASIC METABOLIC PANEL
Calcium: 9.4 mg/dL (ref 8.4–10.5)
Creatinine, Ser: 2.4 mg/dL — ABNORMAL HIGH (ref 0.4–1.5)
Sodium: 139 mEq/L (ref 135–145)

## 2011-05-17 LAB — CBC WITH DIFFERENTIAL/PLATELET
Basophils Absolute: 0 10*3/uL (ref 0.0–0.1)
Eosinophils Absolute: 0.4 10*3/uL (ref 0.0–0.7)
HCT: 38.7 % — ABNORMAL LOW (ref 39.0–52.0)
Hemoglobin: 12.9 g/dL — ABNORMAL LOW (ref 13.0–17.0)
Lymphocytes Relative: 20.9 % (ref 12.0–46.0)
Lymphs Abs: 1.6 10*3/uL (ref 0.7–4.0)
MCHC: 33.4 g/dL (ref 30.0–36.0)
Monocytes Relative: 6 % (ref 3.0–12.0)
Neutro Abs: 5.3 10*3/uL (ref 1.4–7.7)
Platelets: 174 10*3/uL (ref 150.0–400.0)
RDW: 14.7 % — ABNORMAL HIGH (ref 11.5–14.6)

## 2011-05-17 MED ORDER — AMLODIPINE BESYLATE 10 MG PO TABS: 10.0000 mg | ORAL_TABLET | Freq: Every day | ORAL | Status: DC

## 2011-05-17 NOTE — Telephone Encounter (Signed)
Message copied by Antony Odea on Wed May 17, 2011  2:49 PM ------      Message from: [place], [place] J      Created: Wed May 17, 2011  2:41 PM       Will need to delay cath.       Creatinine is too high      DC  amLODipine-benazepril (LOTREL) 10-20 MG per capsule,      DC HCTZ             Start amlodipine 10 mg daily            Redraw BMP in 3 weeks.              He should see his medical doctor for his worsening renal function

## 2011-05-17 NOTE — Progress Notes (Signed)
[name] Date of Birth  Feb 07, 1934 [facility] [address]    [address] [place], [place]  [address] [phone]  Fax  [phone]  History of Present Illness:  [name] is a 77 year old gentleman with a history of coronary artery disease. He status post CABG x2. His last CABG was in 2000.  He has a history of diabetes mellitus, hypertension, hyperlipidemia, and hypothyroidism. He's had bilateral carotid endarterectomies.  He also has mild aortic stenosis by echo in January 2011   He recently presented with worsening chest pain and shortness of breath. A stress Myoview study revealed a small mid / basal lateral defect.  He has not taken any nitroglycerin for these pains.  He continues to have episodes of shortness breath with chest pain. He has these both with and without exercise.  Because of his symptoms and his abnormal Myoview study we'll schedule him for right and left heart cardiac catheterization.  Current Outpatient Prescriptions on File Prior to Visit  Medication Sig Dispense Refill  . allopurinol (ZYLOPRIM) 300 MG tablet Take 1 tablet (300 mg total) by mouth daily.  30 tablet  11  . amLODipine-benazepril (LOTREL) 10-20 MG per capsule Take 1 capsule by mouth daily.  30 capsule  11  . aspirin 325 MG tablet Take 1 tablet (325 mg total) by mouth daily.  30 tablet  11  . atorvastatin (LIPITOR) 40 MG tablet Take 40 mg by mouth daily.        . cloNIDine (CATAPRES) 0.2 MG tablet Take 1 tablet (0.2 mg total) by mouth 2 (two) times daily.  60 tablet  11  . clopidogrel (PLAVIX) 75 MG tablet Take 1 tablet (75 mg total) by mouth daily.  30 tablet  11  . hydrochlorothiazide (HYDRODIURIL) 25 MG tablet Take 25 mg by mouth daily.        . insulin aspart (NOVOLOG) 100 UNIT/ML injection Use as directed  10 mL  12  . insulin glargine (LANTUS) 100 UNIT/ML injection Use as directed   10 mL  12  . isosorbide mononitrate (IMDUR) 60 MG 24 hr tablet Take 1 tablet (60 mg total) by  mouth daily.  90 tablet  11  . levothyroxine (SYNTHROID) 150 MCG tablet Take 1 tablet (150 mcg total) by mouth daily.  30 tablet  11  . metFORMIN (GLUCOPHAGE) 850 MG tablet Take 1 tablet (850 mg total) by mouth 2 (two) times daily with a meal.  60 tablet  11  . Multiple Vitamins-Minerals (MULTIVITAL PLATINUM) TABS One po daily   30 each  0  . nitroGLYCERIN (NITROSTAT) 0.4 MG SL tablet Place 1 tablet (0.4 mg total) under the tongue every 5 (five) minutes as needed for chest pain.  90 tablet  12  . ranitidine (ZANTAC) 300 MG tablet Take 300 mg by mouth 2 (two) times daily.   30 tablet  11  . ranolazine (RANEXA) 500 MG 12 hr tablet Take 1 tablet (500 mg total) by mouth 2 (two) times daily.  60 tablet  5    Allergies  Allergen Reactions  . Betadine (Povidone Iodine)   . Iodinated Diagnostic Agents Rash    Does fine with premedications for cath    Past Medical History  Diagnosis Date  . Hypertension   . Hyperlipidemia   . Thyroid disease   . CAD (coronary artery disease)   . Type 2 diabetes mellitus      with fair control  . Hypothyroidism   . Generalized  weakness     without overt findings  . Dehydration     Mild dehydration, resolved  . Peripheral vascular disease   . Carotid disease, bilateral     with multiple bilateral carotid surgeries  . Psoriasis   . Meniere's disease   . Degenerative joint disease   . Gout   . Orthopnea     Two-pillow  . Unstable angina     Past Surgical History  Procedure Date  . Coronary artery bypass graft 1997 and 2000  . Cardiac catheterization   . Carotid endarterectomy   . Lung removal, partial   . Laminectomies July 2001    lumbar laminectomies  . Thoracotomy      left--due to fungal infection  . Shoulder surgery     History  Smoking status  . Never Smoker   Smokeless tobacco  . Not on file    History  Alcohol Use No    Family History  Problem Relation Age of Onset  . Heart attack Father   . Diabetes Mother   . Heart  disease Brother     Reviw of Systems:  Reviewed in the HPI.  All other systems are negative.  Physical Exam: BP 106/52  Pulse 74  Ht 5\' 10"  (1.778 m)  Wt 197 lb 6.4 oz (89.54 kg)  BMI 28.32 kg/m2 The patient is alert and oriented x 3.  The mood and affect are normal.   Skin: warm and dry.  Color is normal.    HEENT:   His carotids are 1+. He has bilateral carotid endarterectomy scars. There is no JVD. His neck is supple.  Lungs: Lungs are clear.   Heart: Heart regular S1 and S2.  he has a 2/6 systolic   murmur at the left sternal border.  PMI is nondisplaced  Abdomen: Abdominal symptoms good bowel sounds. There is no hepatosplenomegaly.  Extremities:  He has good pulses. His extremities have no edema to  Neuro:   nonfocal.     Assessment / Plan:

## 2011-05-17 NOTE — Patient Instructions (Signed)
Your physician has requested that you have a cardiac catheterization. Cardiac catheterization is used to diagnose and/or treat various heart conditions. Doctors may recommend this procedure for a number of different reasons. The most common reason is to evaluate chest pain. Chest pain can be a symptom of coronary artery disease (CAD), and cardiac catheterization can show whether plaque is narrowing or blocking your heart's arteries. This procedure is also used to evaluate the valves, as well as measure the blood flow and oxygen levels in different parts of your heart. For further information please visit [email or web address] Please follow instruction sheet, as given. Left and right heart cath Labs today

## 2011-05-17 NOTE — Telephone Encounter (Signed)
Cath on hold, informed pt of med changes, set up lab redraw and sent labs to pcp and informed pt  he needed an app with pcp soon.

## 2011-05-17 NOTE — Assessment & Plan Note (Addendum)
[name] has a known history of coronary artery disease. He's had coronary artery bypass grafting. He now presents with worsening episodes of chest pain and shortness breath. The Myoview study revealed mid / basilar lateral defect.  We'll schedule him for a right and left heart catheterization next Monday.  We have discussed the risks, benefits, and options. He understands and agrees to proceed.

## 2011-05-17 NOTE — Assessment & Plan Note (Addendum)
We had originally arranged for [name]  to have a cardiac catheterization on Monday. His preprocedure labs revealed that his creatinine is now up to 2.4. He has a history of diabetes mellitus, hypertension, and hyperlipidemia.  We will change of his medications. We will stop his HCTZ. We'll discontinue the ACE inhibitor. Specifically we will stop the Lotrel and start him on amlodipine 10 mg a day. He will probably need to see his general medical doctor for this.  Will recheck his basic metabolic profile in 3 weeks. Will schedule the  cardiac catheterization when his creatinine is back down to normal. His baseline creatinine is around 1.6-1.7.

## 2011-05-22 ENCOUNTER — Telehealth: Payer: Self-pay | Admitting: *Deleted

## 2011-05-22 ENCOUNTER — Inpatient Hospital Stay (HOSPITAL_BASED_OUTPATIENT_CLINIC_OR_DEPARTMENT_OTHER): Admission: RE | Admit: 2011-05-22 | Payer: Medicare Other | Source: Ambulatory Visit | Admitting: Cardiovascular Disease

## 2011-05-22 ENCOUNTER — Encounter (HOSPITAL_BASED_OUTPATIENT_CLINIC_OR_DEPARTMENT_OTHER): Admission: RE | Payer: Self-pay | Source: Ambulatory Visit

## 2011-05-22 SURGERY — JV LEFT AND RIGHT HEART CATHETERIZATION WITH CORONARY ANGIOGRAM
Anesthesia: Moderate Sedation

## 2011-05-22 NOTE — Telephone Encounter (Signed)
Message copied by Antony Odea on Mon May 22, 2011  2:40 PM ------      Message from: [place], [place]      Created: Sun May 21, 2011  8:33 PM       i will probably need to see him this week to explain the delay to him.  Will need to recheck in several weeks if not already ordered

## 2011-05-22 NOTE — Telephone Encounter (Signed)
Pt saw pcp dr Jarold Motto today, metformin was dc'd along with motrin and aleve. They are setting him up with app with nephrology. Aware of lab draw here this week.

## 2011-05-22 NOTE — Telephone Encounter (Signed)
Left msg to update [place] with any recent PCP or nephrology app. Pt needs [facility] as soon as renal insufficiency is better. Pt informed by msg.

## 2011-05-22 NOTE — Telephone Encounter (Signed)
Great!

## 2011-05-24 NOTE — Telephone Encounter (Signed)
FU Call: Pt wife calling regarding setting up appt with kidney MD. Pt wife said they are dragging their feet and stating that they need to review the paperwork prior to scheduling an appt. Please return pt wife call to discuss further.

## 2011-05-24 NOTE — Telephone Encounter (Signed)
Called and spoke with wife, she is frustrated with pcp with getting him into urology, told her to call again, she tried two days ago and no call back yet, told her to tell them also he is having frequency. She said she would try again to call them.

## 2011-05-25 ENCOUNTER — Telehealth: Payer: Self-pay | Admitting: *Deleted

## 2011-05-25 NOTE — Telephone Encounter (Signed)
Called dr Eloise Harman office msg to [facility] to call back to update [place] with referral from them to nephrologist, they are to call back, spoke with wife/margaret and she has not heard a word is what prompted the call.

## 2011-05-26 NOTE — Telephone Encounter (Signed)
Called her and informed her i havent heard from them either, pt to have labs redrawn here next week.

## 2011-05-26 NOTE — Telephone Encounter (Signed)
Fu call Pt's wife is calling back. She is still waiting from [facility]. She said he does not have uti Please call her back

## 2011-06-01 ENCOUNTER — Telehealth: Payer: Self-pay | Admitting: *Deleted

## 2011-06-01 ENCOUNTER — Other Ambulatory Visit (INDEPENDENT_AMBULATORY_CARE_PROVIDER_SITE_OTHER): Payer: Medicare Other | Admitting: *Deleted

## 2011-06-01 DIAGNOSIS — I10 Essential (primary) hypertension: Secondary | ICD-10-CM

## 2011-06-01 DIAGNOSIS — N289 Disorder of kidney and ureter, unspecified: Secondary | ICD-10-CM

## 2011-06-01 LAB — BASIC METABOLIC PANEL
Calcium: 8.8 mg/dL (ref 8.4–10.5)
Creatinine, Ser: 1.8 mg/dL — ABNORMAL HIGH (ref 0.4–1.5)
GFR: 40.02 mL/min — ABNORMAL LOW (ref >60.00)
Sodium: 134 mEq/L — ABNORMAL LOW (ref 135–145)

## 2011-06-01 NOTE — Telephone Encounter (Signed)
I called and spoke with pt he checked [employer], he said he doesn't always take his insulin and I councilled  him on the importance and the damage it is causing. Told pt to call dr Jarold Motto now and ask them how much insulin to take, pt agreed to do so.

## 2011-06-01 NOTE — Telephone Encounter (Signed)
Called pt to in form him of high glucose. Called dr Jarold Motto office and spoke with DJ and informed of blood sugar. They will contact pt. Informed them the pt c/o constant urination.

## 2011-06-07 ENCOUNTER — Ambulatory Visit (INDEPENDENT_AMBULATORY_CARE_PROVIDER_SITE_OTHER): Payer: Medicare Other | Admitting: Cardiovascular Disease

## 2011-06-07 ENCOUNTER — Encounter: Payer: Self-pay | Admitting: Cardiovascular Disease

## 2011-06-07 VITALS — BP 134/70 | HR 63 | Ht 70.0 in | Wt 193.4 lb

## 2011-06-07 DIAGNOSIS — I2510 Atherosclerotic heart disease of native coronary artery without angina pectoris: Secondary | ICD-10-CM

## 2011-06-07 NOTE — Patient Instructions (Addendum)
Your physician recommends that you schedule a follow-up appointment in: 3 months/ app 2/4/ 13 keep that appointment.  Your physician recommends that you return for a FASTING lipid profile: 3 months 07/31/11  Keep appointment with your primary doctor to get better control of your glucose

## 2011-06-07 NOTE — Progress Notes (Signed)
[name] Date of Birth  Jun 20, 1934 [facility] [address]    [address] [place], [place]  [address] [phone]  Fax  [phone]  History of Present Illness:  Mr. Murri is a 77 year old gentleman with a history of coronary artery disease and coronary artery bypass grafting. He recently presented with some right-sided shoulder pain that radiates through to his chest. He had a stress Myoview study which revealed a small inferolateral defect. He was set up for cardiac catheterization but his precath labs revealed a creatinine of 2.4 which was new. His baseline creatinine is between 1.6 and 1.8. We discontinued his HCTZ and his ACE inhibitor.  He was seen by his medical doctor who also stop his metformin.  Recent labs reveal an improvement of his creatinine to 1.8. He has continued to have some of these episodes of chest discomfort. He has tried taking nitroglycerin which did not necessarily help with his chest discomfort.  Current Outpatient Prescriptions on File Prior to Visit  Medication Sig Dispense Refill  . allopurinol (ZYLOPRIM) 300 MG tablet Take 1 tablet (300 mg total) by mouth daily.  30 tablet  11  . amLODipine (NORVASC) 10 MG tablet Take 1 tablet (10 mg total) by mouth daily.  30 tablet  11  . aspirin 325 MG tablet Take 1 tablet (325 mg total) by mouth daily.  30 tablet  11  . atorvastatin (LIPITOR) 40 MG tablet Take 40 mg by mouth daily.        . cloNIDine (CATAPRES) 0.2 MG tablet Take 1 tablet (0.2 mg total) by mouth 2 (two) times daily.  60 tablet  11  . clopidogrel (PLAVIX) 75 MG tablet Take 1 tablet (75 mg total) by mouth daily.  30 tablet  11  . insulin aspart (NOVOLOG) 100 UNIT/ML injection Use as directed  10 mL  12  . insulin glargine (LANTUS) 100 UNIT/ML injection Use as directed   10 mL  12  . isosorbide mononitrate (IMDUR) 60 MG 24 hr tablet Take 1 tablet (60 mg total) by mouth daily.  90 tablet  11  . levothyroxine (SYNTHROID) 150 MCG tablet  Take 1 tablet (150 mcg total) by mouth daily.  30 tablet  11  . Multiple Vitamins-Minerals (MULTIVITAL PLATINUM) TABS One po daily   30 each  0  . nitroGLYCERIN (NITROSTAT) 0.4 MG SL tablet Place 1 tablet (0.4 mg total) under the tongue every 5 (five) minutes as needed for chest pain.  90 tablet  12  . ranitidine (ZANTAC) 300 MG tablet Take 300 mg by mouth 2 (two) times daily.   30 tablet  11    Allergies  Allergen Reactions  . Betadine (Povidone Iodine)   . Iodinated Diagnostic Agents Rash    Does fine with premedications for cath    Past Medical History  Diagnosis Date  . Hypertension   . Hyperlipidemia   . Thyroid disease   . CAD (coronary artery disease)   . Type 2 diabetes mellitus      with fair control  . Hypothyroidism   . Generalized weakness     without overt findings  . Dehydration     Mild dehydration, resolved  . Peripheral vascular disease   . Carotid disease, bilateral     with multiple bilateral carotid surgeries  . Psoriasis   . Meniere's disease   . Degenerative joint disease   . Gout   . Orthopnea     Two-pillow  . Unstable angina  Past Surgical History  Procedure Date  . Coronary artery bypass graft 1997 and 2000  . Cardiac catheterization   . Carotid endarterectomy   . Lung removal, partial   . Laminectomies July 2001    lumbar laminectomies  . Thoracotomy      left--due to fungal infection  . Shoulder surgery     History  Smoking status  . Never Smoker   Smokeless tobacco  . Not on file    History  Alcohol Use No    Family History  Problem Relation Age of Onset  . Heart attack Father   . Diabetes Mother   . Heart disease Brother     Reviw of Systems:  Reviewed in the HPI.  All other systems are negative.  Physical Exam: BP 134/70  Pulse 63  Ht 5\' 10"  (1.778 m)  Wt 193 lb 6.4 oz (87.726 kg)  BMI 27.75 kg/m2 The patient is alert and oriented x 3.  The mood and affect are normal.   Skin: warm and dry.  Color is  normal.    HEENT:   Normocephalic/atraumatic. Mucous membranes are normal.  Lungs: Lungs are clear.   Heart: Regular rate S1-S2. Has a 2/6 systolic ejection murmur cholesterol border.    Abdomen: His abdominal exam is benign.  Extremities:  No clubbing cyanosis or edema the  Neuro:  Exam is nonfocal  ECG:  Assessment / Plan:

## 2011-06-07 NOTE — Assessment & Plan Note (Addendum)
[name] was seen back today for further evaluation of his coronary artery disease and his chest pain. When we set him up for cardiac catheterization last month, his creatinine was found to be 2.4 and a cath was canceled. We stopped his HCTZ and his ACE inhibitor. His medical Dr. stop his metformin.  His creatinine has improved to 1.8.  Today he was seen and really is not complaining of angina like chest pain. He did yard work for 2-3 hours yesterday without any episodes of angina. He picked up his riding lawnmower and put it in the truck last week and as result strained some chest muscles. He's had a little bit of chest wall soreness.  I reviewed the risks of contrast-induced nephropathy. He is at very high risk since he is a diabetic and has hypertension.  At this point it does not appear that he is limited by angina. I think his risk of renal failure is grater than any potential benefit that he may receive from cardiac catheterization. He has had coronary artery bypass grafting already twice and so am sure he has some small areas of ischemia that are not approachable by CABG.  I'll see him again in 3 months. He is to call me sooner if needed.  He had discontinued his Ranexa because of cost. I've encouraged him to take it if at all possible.

## 2011-07-03 ENCOUNTER — Telehealth: Payer: Self-pay | Admitting: *Deleted

## 2011-07-03 ENCOUNTER — Telehealth: Payer: Self-pay | Admitting: Cardiovascular Disease

## 2011-07-03 MED ORDER — RANOLAZINE ER 500 MG PO TB12: 500.0000 mg | ORAL_TABLET | Freq: Every day | ORAL | Status: DC

## 2011-07-03 NOTE — Telephone Encounter (Signed)
Pt wanted to go back on med but only once a day due to cost, script sent and Dr Elease Hashimoto informed.

## 2011-07-03 NOTE — Telephone Encounter (Signed)
New Msg: pt wife calling to talk to nurse about pt ranexa 500mg . Please reutrn pt wife call to dsicuss further.

## 2011-07-03 NOTE — Telephone Encounter (Signed)
He may try the Ranexa once a day but I'm not sure it's going to be effective

## 2011-07-06 DIAGNOSIS — M47817 Spondylosis without myelopathy or radiculopathy, lumbosacral region: Secondary | ICD-10-CM | POA: Diagnosis not present

## 2011-07-11 DIAGNOSIS — IMO0002 Reserved for concepts with insufficient information to code with codable children: Secondary | ICD-10-CM | POA: Diagnosis not present

## 2011-07-31 ENCOUNTER — Other Ambulatory Visit: Payer: Medicare Other | Admitting: *Deleted

## 2011-07-31 ENCOUNTER — Ambulatory Visit: Payer: Medicare Other | Admitting: Cardiovascular Disease

## 2011-08-04 DIAGNOSIS — M25519 Pain in unspecified shoulder: Secondary | ICD-10-CM | POA: Diagnosis not present

## 2011-08-30 ENCOUNTER — Encounter: Payer: Self-pay | Admitting: Cardiovascular Disease

## 2011-08-30 ENCOUNTER — Ambulatory Visit (INDEPENDENT_AMBULATORY_CARE_PROVIDER_SITE_OTHER): Payer: Medicare Other | Admitting: *Deleted

## 2011-08-30 ENCOUNTER — Ambulatory Visit (INDEPENDENT_AMBULATORY_CARE_PROVIDER_SITE_OTHER): Payer: Medicare Other | Admitting: Cardiovascular Disease

## 2011-08-30 DIAGNOSIS — I10 Essential (primary) hypertension: Secondary | ICD-10-CM

## 2011-08-30 DIAGNOSIS — E785 Hyperlipidemia, unspecified: Secondary | ICD-10-CM | POA: Insufficient documentation

## 2011-08-30 DIAGNOSIS — I2589 Other forms of chronic ischemic heart disease: Secondary | ICD-10-CM | POA: Diagnosis not present

## 2011-08-30 DIAGNOSIS — I509 Heart failure, unspecified: Secondary | ICD-10-CM

## 2011-08-30 LAB — LIPID PANEL
HDL: 39.6 mg/dL (ref >39.00)
Total CHOL/HDL Ratio: 4
Triglycerides: 313 mg/dL — ABNORMAL HIGH (ref 0.0–149.0)
VLDL: 62.6 mg/dL — ABNORMAL HIGH (ref 0.0–40.0)

## 2011-08-30 LAB — LDL CHOLESTEROL, DIRECT: Direct LDL: 41.9 mg/dL

## 2011-08-30 LAB — HEPATIC FUNCTION PANEL
Alkaline Phosphatase: 97 U/L (ref 39–117)
Bilirubin, Direct: 0 mg/dL (ref 0.0–0.3)
Total Bilirubin: 0.4 mg/dL (ref 0.3–1.2)

## 2011-08-30 NOTE — Assessment & Plan Note (Signed)
Fasting lipids pending today. We'll check them again in 6 months.

## 2011-08-30 NOTE — Progress Notes (Signed)
[name] Date of Birth  [birth year] [facility] [address]    [address] [place], [place]  [address] [phone]  Fax  [phone]  History of Present Illness:  Mr. Mcginley is a 78 year old gentleman with a history of coronary artery disease and coronary artery bypass grafting. He recently presented with some right-sided shoulder pain that radiates through to his chest. He had a stress Myoview study which revealed a small inferolateral defect. He was set up for cardiac catheterization but his precath labs revealed a creatinine of 2.4 which was new. His baseline creatinine is between 1.6 and 1.8. We discontinued his HCTZ and his ACE inhibitor.  He was seen by his medical doctor who also stop his metformin.  We were going to do a cardiac catheterization this winter but we canceled the case because of his renal insufficiency. His metformin was stopped and he made some other medical changes in his creatinine has now improved. Fortunately, he's not having any further episodes of angina.  He informed me that his insurance company is no longer paying for Ranexa.  He's been bradycardic for years. We do not have him on beta blocker for that reason.    Current Outpatient Prescriptions on File Prior to Visit  Medication Sig Dispense Refill  . allopurinol (ZYLOPRIM) 300 MG tablet Take 1 tablet (300 mg total) by mouth daily.  30 tablet  11  . aspirin 325 MG tablet Take 1 tablet (325 mg total) by mouth daily.  30 tablet  11  . atorvastatin (LIPITOR) 40 MG tablet Take 40 mg by mouth daily.        . cloNIDine (CATAPRES) 0.2 MG tablet Take 0.2 mg by mouth daily.   60 tablet  11  . clopidogrel (PLAVIX) 75 MG tablet Take 1 tablet (75 mg total) by mouth daily.  30 tablet  11  . insulin aspart (NOVOLOG) 100 UNIT/ML injection Use as directed  10 mL  12  . insulin glargine (LANTUS) 100 UNIT/ML injection Use as directed   10 mL  12  . isosorbide mononitrate (IMDUR) 60 MG 24 hr tablet Take 1  tablet (60 mg total) by mouth daily.  90 tablet  11  . levothyroxine (SYNTHROID) 150 MCG tablet Take 1 tablet (150 mcg total) by mouth daily.  30 tablet  11  . Multiple Vitamins-Minerals (MULTIVITAL PLATINUM) TABS One po daily   30 each  0  . nitroGLYCERIN (NITROSTAT) 0.4 MG SL tablet Place 1 tablet (0.4 mg total) under the tongue every 5 (five) minutes as needed for chest pain.  90 tablet  12  . ranitidine (ZANTAC) 300 MG tablet Take 300 mg by mouth 2 (two) times daily.   30 tablet  11    Allergies  Allergen Reactions  . Betadine (Povidone Iodine)   . Iodinated Diagnostic Agents Rash    Does fine with premedications for cath    Past Medical History  Diagnosis Date  . Hypertension   . Hyperlipidemia   . Thyroid disease   . CAD (coronary artery disease)   . Type 2 diabetes mellitus      with fair control  . Hypothyroidism   . Generalized weakness     without overt findings  . Dehydration     Mild dehydration, resolved  . Peripheral vascular disease   . Carotid disease, bilateral     with multiple bilateral carotid surgeries  . Psoriasis   . Meniere's disease   . Degenerative joint disease   .  Gout   . Orthopnea     Two-pillow  . Unstable angina     Past Surgical History  Procedure Date  . Coronary artery bypass graft 1997 and 2000  . Cardiac catheterization   . Carotid endarterectomy   . Lung removal, partial   . Laminectomies July 2001    lumbar laminectomies  . Thoracotomy      left--due to fungal infection  . Shoulder surgery     History  Smoking status  . Never Smoker   Smokeless tobacco  . Not on file    History  Alcohol Use No    Family History  Problem Relation Age of Onset  . Heart attack Father   . Diabetes Mother   . Heart disease Brother     Reviw of Systems:  Reviewed in the HPI.  All other systems are negative.  Physical Exam: BP 120/62  Pulse 60  Ht 5\' 10"  (1.778 m)  Wt 199 lb 12.8 oz (90.629 kg)  BMI 28.67 kg/m2 The patient  is alert and oriented x 3.  The mood and affect are normal.   Skin: warm and dry.  Color is normal.    HEENT:   Normocephalic/atraumatic. Mucous membranes are normal.  Lungs: Lungs are clear.   Heart: Regular rate S1-S2. Has a 2/6 systolic ejection murmur cholesterol border.    Abdomen: His abdominal exam is benign.  Extremities:  No clubbing cyanosis or edema  Neuro:  Exam is nonfocal. Gait is normal. ECG:  Assessment / Plan:

## 2011-08-30 NOTE — Assessment & Plan Note (Signed)
Ms. [name] has coronary artery disease and is status post coronary artery bypass grafting. His last Myoview study revealed some mild irregularities. We initially were set up to do a cardiac catheterization but we had to cancel case because of a creatinine of 2.4. His creatinine has gradually improved as we've made some medication adjustments but now he's not having any episodes of angina and does not want to have a cardiac consultation.  We will see him again in 6 months for followup visit.  We also discussed the ACCELERATE research study. He did not want to participate in the accelerate study.

## 2011-08-30 NOTE — Patient Instructions (Signed)
Your physician wants you to follow-up in: 6 months  You will receive a reminder letter in the mail two months in advance. If you don't receive a letter, please call our office to schedule the follow-up appointment.  Your physician recommends that you return for a FASTING lipid profile: 6 months   

## 2011-08-31 ENCOUNTER — Telehealth: Payer: Self-pay | Admitting: Cardiovascular Disease

## 2011-08-31 MED ORDER — FENOFIBRATE 160 MG PO TABS: 160.0000 mg | ORAL_TABLET | Freq: Every day | ORAL | Status: DC

## 2011-08-31 NOTE — Telephone Encounter (Signed)
Returned call back

## 2011-08-31 NOTE — Telephone Encounter (Signed)
Fu call Patient was calling about blood work results

## 2011-08-31 NOTE — Patient Instructions (Signed)
Pt called and agreed to start new med fenofibrate and to f/u 3 mo with fasting labs.

## 2011-09-21 DIAGNOSIS — M1710 Unilateral primary osteoarthritis, unspecified knee: Secondary | ICD-10-CM | POA: Diagnosis not present

## 2011-10-04 DIAGNOSIS — E1159 Type 2 diabetes mellitus with other circulatory complications: Secondary | ICD-10-CM | POA: Diagnosis not present

## 2011-10-04 DIAGNOSIS — I10 Essential (primary) hypertension: Secondary | ICD-10-CM | POA: Diagnosis not present

## 2011-10-04 DIAGNOSIS — I739 Peripheral vascular disease, unspecified: Secondary | ICD-10-CM | POA: Diagnosis not present

## 2011-10-04 DIAGNOSIS — I2510 Atherosclerotic heart disease of native coronary artery without angina pectoris: Secondary | ICD-10-CM | POA: Diagnosis not present

## 2011-11-06 ENCOUNTER — Other Ambulatory Visit: Payer: Self-pay | Admitting: Cardiology

## 2011-12-05 ENCOUNTER — Other Ambulatory Visit (INDEPENDENT_AMBULATORY_CARE_PROVIDER_SITE_OTHER): Payer: Medicare Other

## 2011-12-05 DIAGNOSIS — E785 Hyperlipidemia, unspecified: Secondary | ICD-10-CM

## 2011-12-05 LAB — LIPID PANEL
HDL: 37.1 mg/dL — ABNORMAL LOW (ref >39.00)
Triglycerides: 214 mg/dL — ABNORMAL HIGH (ref 0.0–149.0)
VLDL: 42.8 mg/dL — ABNORMAL HIGH (ref 0.0–40.0)

## 2011-12-05 LAB — BASIC METABOLIC PANEL
BUN: 34 mg/dL — ABNORMAL HIGH (ref 6–23)
Calcium: 9.4 mg/dL (ref 8.4–10.5)
GFR: 26.59 mL/min — ABNORMAL LOW (ref >60.00)
Glucose, Bld: 142 mg/dL — ABNORMAL HIGH (ref 70–99)

## 2011-12-05 LAB — HEPATIC FUNCTION PANEL
Albumin: 4.2 g/dL (ref 3.5–5.2)
Bilirubin, Direct: 0 mg/dL (ref 0.0–0.3)
Total Protein: 6.7 g/dL (ref 6.0–8.3)

## 2011-12-07 DIAGNOSIS — E1139 Type 2 diabetes mellitus with other diabetic ophthalmic complication: Secondary | ICD-10-CM | POA: Diagnosis not present

## 2011-12-07 DIAGNOSIS — H35359 Cystoid macular degeneration, unspecified eye: Secondary | ICD-10-CM | POA: Diagnosis not present

## 2011-12-07 DIAGNOSIS — H35369 Drusen (degenerative) of macula, unspecified eye: Secondary | ICD-10-CM | POA: Diagnosis not present

## 2011-12-07 DIAGNOSIS — E11339 Type 2 diabetes mellitus with moderate nonproliferative diabetic retinopathy without macular edema: Secondary | ICD-10-CM | POA: Diagnosis not present

## 2012-01-25 DIAGNOSIS — I739 Peripheral vascular disease, unspecified: Secondary | ICD-10-CM | POA: Diagnosis not present

## 2012-01-25 DIAGNOSIS — I10 Essential (primary) hypertension: Secondary | ICD-10-CM | POA: Diagnosis not present

## 2012-01-25 DIAGNOSIS — I2510 Atherosclerotic heart disease of native coronary artery without angina pectoris: Secondary | ICD-10-CM | POA: Diagnosis not present

## 2012-01-25 DIAGNOSIS — E1159 Type 2 diabetes mellitus with other circulatory complications: Secondary | ICD-10-CM | POA: Diagnosis not present

## 2012-02-05 ENCOUNTER — Telehealth: Payer: Self-pay | Admitting: Vascular Surgery

## 2012-02-05 NOTE — Telephone Encounter (Signed)
Called pt. back regarding his c/o breathing problems.  States he has some difficulty breathing for "quite awhile".  States longer than a month when questioned.  Denies any chest pain.  Admits to swelling in feet and ankles when questioned.  Stated his medical doctor advised him to call Dr. Edilia Bo.  Explained to pt. that Dr. Edilia Bo is a Vascular Surgeon and follows him for his carotid disease.  Advised pt. To call his PCP or cardiologist for the breathing problem.  Advised pt. Will also have our office call him for appt. To have his next Carotid Ultrasound.  Pt. Verb. Understanding, and agrees to call his Cardiologist for breathing issues.

## 2012-02-05 NOTE — Telephone Encounter (Signed)
Mr Cutillo has been transferred to the appointment desk twice, but after speaking with him, I believe he may need to speak with our triage nurse. Mr Muns is having trouble breathing and wanted to see Dr Dickson as soon as possible. Mr Boulier said that he has had trouble breathing for a couple of weeks now. He mentioned it to his PCP, Dr Paterson, but said they didn't really do more than mention it. (??) He does not see a pulmonary specialist at this time.  ° °I believe the last time he was in our office was for his carotid issues in 2009. Mr Gerbino has not been followed for his carotid issues since that time. Please call Mr Swinson at his home number.  ° °Dana ° °

## 2012-02-05 NOTE — Telephone Encounter (Signed)
Mr [name] has been transferred to the appointment desk twice, but after speaking with him, I believe he may need to speak with our triage nurse. Mr [name] is having trouble breathing and wanted to see Dr Edilia Bo as soon as possible. Mr [name] said that he has had trouble breathing for a couple of weeks now. He mentioned it to his PCP, Dr Eloise Harman, but said they didn't really do more than mention it. (??) He does not see a pulmonary specialist at this time.   I believe the last time he was in our office was for his carotid issues in 2009. Mr [name] has not been followed for his carotid issues since that time. Please call Mr [name] at his home number.   Annabelle Harman

## 2012-02-13 ENCOUNTER — Other Ambulatory Visit (INDEPENDENT_AMBULATORY_CARE_PROVIDER_SITE_OTHER): Payer: Medicare Other

## 2012-02-13 ENCOUNTER — Ambulatory Visit (INDEPENDENT_AMBULATORY_CARE_PROVIDER_SITE_OTHER): Payer: Medicare Other | Admitting: Nurse Practitioner

## 2012-02-13 ENCOUNTER — Encounter: Payer: Self-pay | Admitting: Nurse Practitioner

## 2012-02-13 VITALS — BP 140/70 | HR 55 | Ht 70.5 in | Wt 206.0 lb

## 2012-02-13 DIAGNOSIS — R0600 Dyspnea, unspecified: Secondary | ICD-10-CM

## 2012-02-13 DIAGNOSIS — R0609 Other forms of dyspnea: Secondary | ICD-10-CM | POA: Diagnosis not present

## 2012-02-13 DIAGNOSIS — N289 Disorder of kidney and ureter, unspecified: Secondary | ICD-10-CM

## 2012-02-13 DIAGNOSIS — I2589 Other forms of chronic ischemic heart disease: Secondary | ICD-10-CM | POA: Diagnosis not present

## 2012-02-13 DIAGNOSIS — R0989 Other specified symptoms and signs involving the circulatory and respiratory systems: Secondary | ICD-10-CM | POA: Diagnosis not present

## 2012-02-13 DIAGNOSIS — I10 Essential (primary) hypertension: Secondary | ICD-10-CM | POA: Diagnosis not present

## 2012-02-13 NOTE — Assessment & Plan Note (Signed)
Patient presents with a several month history of dyspnea. Has an outflow murmur on exam. Will check labs today, send for CXR and obtain an echo. His last EF per Myoview was normal. We will get his recall appointment set up for Dr. Elease Hashimoto for next month. For now, no change in his current medicines. Further disposition to follow once his studies are complete. Patient is agreeable to this plan and will call if any problems develop in the interim.

## 2012-02-13 NOTE — Assessment & Plan Note (Signed)
He has not had his medicines today yet. Repeat blood pressure by me was improved. He says he monitors at home and his readings are satisfactory.

## 2012-02-13 NOTE — Progress Notes (Signed)
[name] Date of Birth: [birth year] Medical Record #[id number]  History of Present Illness: [name] is seen today for a work in visit. He is seen for Dr. Elease Hashimoto. He has known CAD with remote CABG in 1997 and redo CABG in 2000. His other issues include CRI, DM, HTN, HLD, and PVD. He has had a prior abnormal Myoview in November of 2012 and was set up for cardiac cath but this was cancelled due to his kidney function being quite elevated. Some of his medicines were adjusted. When he was seen back, his symptoms had abated and he has been managed medically. He has chronic bradycardia which precludes beta blocker therapy.   He comes in today. He is here alone. This is an early visit for him. He was here back in March and seemed to be ok. Since that time, he has developed shortness of breath. It happens mostly in the morning. His weight is up. Does not watch his salt. His chest pain seems to be at baseline. Some cough. No swelling. His weight is up 7# since his last visit. He has not had a recent CXR. He does not exercise regularly, but still mows his grass.   Current Outpatient Prescriptions on File Prior to Visit  Medication Sig Dispense Refill  . allopurinol (ZYLOPRIM) 300 MG tablet Take 1 tablet (300 mg total) by mouth daily.  30 tablet  11  . amLODipine-benazepril (LOTREL) 10-20 MG per capsule Take 1 capsule by mouth daily.      Marland Kitchen aspirin 325 MG tablet Take 1 tablet (325 mg total) by mouth daily.  30 tablet  11  . atorvastatin (LIPITOR) 40 MG tablet Take 40 mg by mouth daily.        . cloNIDine (CATAPRES) 0.2 MG tablet Take 0.2 mg by mouth daily.   60 tablet  11  . clopidogrel (PLAVIX) 75 MG tablet Take 1 tablet (75 mg total) by mouth daily.  30 tablet  11  . fenofibrate 160 MG tablet Take 1 tablet (160 mg total) by mouth daily.  30 tablet  5  . insulin aspart (NOVOLOG) 100 UNIT/ML injection Inject 40 Units into the skin daily. Use as directed  10 mL  12  . insulin glargine (LANTUS) 100  UNIT/ML injection 20 units in the morning, 20 units in the evening  10 mL  12  . isosorbide mononitrate (IMDUR) 60 MG 24 hr tablet TAKE ONE TABLET BY MOUTH EVERY DAY  90 tablet  3  . levothyroxine (SYNTHROID) 150 MCG tablet Take 1 tablet (150 mcg total) by mouth daily.  30 tablet  11  . metFORMIN (GLUCOPHAGE) 850 MG tablet Take 850 mg by mouth 2 (two) times daily with a meal.      . Multiple Vitamins-Minerals (MULTIVITAL PLATINUM) TABS One po daily   30 each  0  . nitroGLYCERIN (NITROSTAT) 0.4 MG SL tablet Place 1 tablet (0.4 mg total) under the tongue every 5 (five) minutes as needed for chest pain.  90 tablet  12  . ranitidine (ZANTAC) 300 MG tablet Take 300 mg by mouth 2 (two) times daily.   30 tablet  11    Allergies  Allergen Reactions  . Betadine (Povidone Iodine)   . Iodinated Diagnostic Agents Rash    Does fine with premedications for cath    Past Medical History  Diagnosis Date  . Hypertension   . Hyperlipidemia   . Thyroid disease   . CAD (coronary artery disease)  with prior CABG in 1997 with redo in 2000; last cath in 2008 - managed medically  . Type 2 diabetes mellitus      with fair control  . Hypothyroidism   . Generalized weakness     without overt findings  . Dehydration     Mild dehydration, resolved  . Peripheral vascular disease   . Carotid disease, bilateral     with multiple bilateral carotid surgeries  . Psoriasis   . Meniere's disease   . Degenerative joint disease   . Gout   . Orthopnea     Two-pillow  . CRI (chronic renal insufficiency)     Past Surgical History  Procedure Date  . Coronary artery bypass graft 1997 and 2000  . Cardiac catheterization   . Carotid endarterectomy   . Lung removal, partial   . Laminectomies July 2001    lumbar laminectomies  . Thoracotomy      left--due to fungal infection  . Shoulder surgery     History  Smoking status  . Never Smoker   Smokeless tobacco  . Not on file    History  Alcohol Use No     Family History  Problem Relation Age of Onset  . Heart attack Father   . Diabetes Mother   . Heart disease Brother     Review of Systems: The review of systems is per the HPI.  All other systems were reviewed and are negative.  Physical Exam: BP 150/78  Pulse 55  Ht 5' 10.5" (1.791 m)  Wt 206 lb (93.441 kg)  BMI 29.14 kg/m2 Patient is very pleasant and in no acute distress. He is obese. Weight is up 7# from last visit. Skin is warm and dry. Color is normal.  HEENT is unremarkable. Normocephalic/atraumatic. PERRL. Sclera are nonicteric. Neck is supple. No masses. No JVD. Lungs are clear. Cardiac exam shows a regular rate and rhythm. He has an outflow murmur. Abdomen is soft. Extremities are without edema. Gait and ROM are intact. No gross neurologic deficits noted.   LABORATORY DATA: .   Chemistry      Component Value Date/Time   NA 141 12/05/2011 0828   K 3.7 12/05/2011 0828   CL 105 12/05/2011 0828   CO2 27 12/05/2011 0828   BUN 34* 12/05/2011 0828   CREATININE 2.5* 12/05/2011 0828      Component Value Date/Time   CALCIUM 9.4 12/05/2011 0828   ALKPHOS 55 12/05/2011 0828   AST 22 12/05/2011 0828   ALT 20 12/05/2011 0828   BILITOT 0.4 12/05/2011 0828     Lab Results  Component Value Date   CHOL 158 12/05/2011   HDL 37.10* 12/05/2011   LDLDIRECT 85.1 12/05/2011   TRIG 214.0* 12/05/2011   CHOLHDL 4 12/05/2011     Assessment / Plan:

## 2012-02-13 NOTE — Patient Instructions (Signed)
We need to check some labs today  We are going to send you for a chest x ray. Go to the [facility] to the 1st Floor - [employer]. You can walk in.   We are going to get an ultrasound of your heart to evaluate the murmur you have  For now, stay on your current medicines  Schedule recall appointment with Dr. Elease Hashimoto for September.   Call the [facility] office at [phone] if you have any questions, problems or concerns.

## 2012-02-13 NOTE — Assessment & Plan Note (Signed)
His chest pain seems to be stable.

## 2012-02-13 NOTE — Assessment & Plan Note (Signed)
We are rechecking a BMET today.

## 2012-02-15 ENCOUNTER — Ambulatory Visit (HOSPITAL_COMMUNITY): Payer: Medicare Other | Attending: Cardiology

## 2012-02-15 DIAGNOSIS — E119 Type 2 diabetes mellitus without complications: Secondary | ICD-10-CM | POA: Diagnosis not present

## 2012-02-15 DIAGNOSIS — I10 Essential (primary) hypertension: Secondary | ICD-10-CM | POA: Insufficient documentation

## 2012-02-15 DIAGNOSIS — R0609 Other forms of dyspnea: Secondary | ICD-10-CM | POA: Insufficient documentation

## 2012-02-15 DIAGNOSIS — I379 Nonrheumatic pulmonary valve disorder, unspecified: Secondary | ICD-10-CM | POA: Insufficient documentation

## 2012-02-15 DIAGNOSIS — I079 Rheumatic tricuspid valve disease, unspecified: Secondary | ICD-10-CM | POA: Diagnosis not present

## 2012-02-15 DIAGNOSIS — E785 Hyperlipidemia, unspecified: Secondary | ICD-10-CM | POA: Diagnosis not present

## 2012-02-15 DIAGNOSIS — R0989 Other specified symptoms and signs involving the circulatory and respiratory systems: Secondary | ICD-10-CM | POA: Diagnosis not present

## 2012-02-15 DIAGNOSIS — R0600 Dyspnea, unspecified: Secondary | ICD-10-CM

## 2012-02-15 DIAGNOSIS — I080 Rheumatic disorders of both mitral and aortic valves: Secondary | ICD-10-CM | POA: Diagnosis not present

## 2012-02-15 NOTE — Progress Notes (Signed)
Echocardiogram performed.  

## 2012-02-20 ENCOUNTER — Telehealth: Payer: Self-pay | Admitting: Cardiovascular Disease

## 2012-02-20 NOTE — Telephone Encounter (Signed)
Spouse wants to talk to you about patients lab work

## 2012-02-20 NOTE — Telephone Encounter (Signed)
Pt very upset, pt had lab drawn after 02/13/12 visit, no results. Office lab called pt today to ask him to come back in for blood draw and told pt  the blood was miss labeled. Pt refuses to come back for BNP/ BMET.  Pt had order for CXR to be done that day also, pt called back to see if test was to be completed now.  He was confused because it wasn't highlighted and was not sure if it should be done. Whom ever he spoke with according to pt,  told him no since it wasn't highlighted, I asked pt to get labs done and explained importance of the information it will provide. Told wife/ pt I will forward note to management and they will receive a call, wife/ pt accepting of plan.

## 2012-02-28 ENCOUNTER — Ambulatory Visit: Payer: Medicare Other | Admitting: Vascular Surgery

## 2012-02-28 ENCOUNTER — Other Ambulatory Visit: Payer: Medicare Other

## 2012-02-29 ENCOUNTER — Ambulatory Visit (INDEPENDENT_AMBULATORY_CARE_PROVIDER_SITE_OTHER): Payer: Medicare Other | Admitting: *Deleted

## 2012-02-29 DIAGNOSIS — I2589 Other forms of chronic ischemic heart disease: Secondary | ICD-10-CM | POA: Diagnosis not present

## 2012-02-29 DIAGNOSIS — R0602 Shortness of breath: Secondary | ICD-10-CM | POA: Diagnosis not present

## 2012-02-29 LAB — BRAIN NATRIURETIC PEPTIDE: Pro B Natriuretic peptide (BNP): 59 pg/mL (ref 0.0–100.0)

## 2012-02-29 LAB — BASIC METABOLIC PANEL
BUN: 48 mg/dL — ABNORMAL HIGH (ref 6–23)
Chloride: 99 mEq/L (ref 96–112)
Potassium: 4.3 mEq/L (ref 3.5–5.1)

## 2012-03-01 ENCOUNTER — Other Ambulatory Visit: Payer: Self-pay | Admitting: *Deleted

## 2012-03-01 DIAGNOSIS — I6529 Occlusion and stenosis of unspecified carotid artery: Secondary | ICD-10-CM

## 2012-03-01 DIAGNOSIS — Z48812 Encounter for surgical aftercare following surgery on the circulatory system: Secondary | ICD-10-CM

## 2012-03-05 ENCOUNTER — Encounter: Payer: Self-pay | Admitting: Vascular Surgery

## 2012-03-06 ENCOUNTER — Encounter: Payer: Self-pay | Admitting: Vascular Surgery

## 2012-03-06 ENCOUNTER — Ambulatory Visit (INDEPENDENT_AMBULATORY_CARE_PROVIDER_SITE_OTHER): Payer: Medicare Other | Admitting: Vascular Surgery

## 2012-03-06 ENCOUNTER — Other Ambulatory Visit (INDEPENDENT_AMBULATORY_CARE_PROVIDER_SITE_OTHER): Payer: Medicare Other | Admitting: *Deleted

## 2012-03-06 VITALS — BP 158/64 | HR 60 | Resp 12 | Ht 70.0 in | Wt 206.2 lb

## 2012-03-06 DIAGNOSIS — Z48812 Encounter for surgical aftercare following surgery on the circulatory system: Secondary | ICD-10-CM | POA: Diagnosis not present

## 2012-03-06 DIAGNOSIS — I6529 Occlusion and stenosis of unspecified carotid artery: Secondary | ICD-10-CM

## 2012-03-06 NOTE — Progress Notes (Signed)
[facility]  Patient name: [name] MRN: [id number] DOB: [birth year] Sex: male  REASON FOR VISIT: follow up of a recurrent right carotid stenosis.  HPI: [name] is a 78 y.o. male who is had multiple previous carotid operations. His original left carotid endarterectomy was in 1990 by Dr. Nedra Hai. In 1997 I did a redo left carotid endarterectomy at the time of his open-heart surgery. He did have a subsequent recurrence on the left in 2000 and ultimately have replacement of the carotid with interposition vein graft in 2001. I did a right carotid endarterectomy at the time of his coronary artery bypass surgery in 2002. He developed recurrent right carotid stenosis and I been following this. The last study I haven't R. Office was in December of 2011 when he had a 60-79% right carotid stenosis with no significant recurrent stenosis on the left. He denies any history of stroke, TIAs, expressive or receptive aphasia, or amaurosis fugax. He's had no recent cardiac symptoms.  Past Medical History  Diagnosis Date  . Hypertension   . Hyperlipidemia   . Thyroid disease   . CAD (coronary artery disease)     with prior CABG in 1997 with redo in 2000; last cath in 2008 - managed medically  . Type 2 diabetes mellitus      with fair control  . Hypothyroidism   . Generalized weakness     without overt findings  . Dehydration     Mild dehydration, resolved  . Peripheral vascular disease   . Carotid disease, bilateral     with multiple bilateral carotid surgeries  . Psoriasis   . Meniere's disease   . Degenerative joint disease   . Gout   . Orthopnea     Two-pillow  . CRI (chronic renal insufficiency)     Family History  Problem Relation Age of Onset  . Heart attack Father   . Heart disease Father   . Hyperlipidemia Father   . Diabetes Mother   . Heart disease Brother     SOCIAL HISTORY: History  Substance Use Topics  . Smoking status: Never Smoker   .  Smokeless tobacco: Not on file  . Alcohol Use: No    Allergies  Allergen Reactions  . Betadine (Povidone Iodine)   . Iodinated Diagnostic Agents Rash    Does fine with premedications for cath    Current Outpatient Prescriptions  Medication Sig Dispense Refill  . allopurinol (ZYLOPRIM) 300 MG tablet Take 1 tablet (300 mg total) by mouth daily.  30 tablet  11  . amLODipine-benazepril (LOTREL) 10-20 MG per capsule Take 1 capsule by mouth daily.      Marland Kitchen aspirin 325 MG tablet Take 1 tablet (325 mg total) by mouth daily.  30 tablet  11  . atorvastatin (LIPITOR) 40 MG tablet Take 40 mg by mouth daily.        . cloNIDine (CATAPRES) 0.2 MG tablet Take 0.2 mg by mouth daily.   60 tablet  11  . clopidogrel (PLAVIX) 75 MG tablet Take 1 tablet (75 mg total) by mouth daily.  30 tablet  11  . fenofibrate 160 MG tablet Take 1 tablet (160 mg total) by mouth daily.  30 tablet  5  . insulin aspart (NOVOLOG) 100 UNIT/ML injection Inject 40 Units into the skin daily. Use as directed  10 mL  12  . insulin glargine (LANTUS) 100 UNIT/ML injection 20 units in the morning, 20 units in the evening  10 mL  12  . isosorbide mononitrate (IMDUR) 60 MG 24 hr tablet TAKE ONE TABLET BY MOUTH EVERY DAY  90 tablet  3  . levothyroxine (SYNTHROID) 150 MCG tablet Take 1 tablet (150 mcg total) by mouth daily.  30 tablet  11  . metFORMIN (GLUCOPHAGE) 850 MG tablet Take 850 mg by mouth 2 (two) times daily with a meal.      . Multiple Vitamins-Minerals (MULTIVITAL PLATINUM) TABS One po daily   30 each  0  . nitroGLYCERIN (NITROSTAT) 0.4 MG SL tablet Place 0.4 mg under the tongue every 5 (five) minutes as needed.      . ranitidine (ZANTAC) 300 MG tablet Take 300 mg by mouth 2 (two) times daily.   30 tablet  11  . DISCONTD: nitroGLYCERIN (NITROSTAT) 0.4 MG SL tablet Place 1 tablet (0.4 mg total) under the tongue every 5 (five) minutes as needed for chest pain.  90 tablet  12    REVIEW OF SYSTEMS: Arly.Keller ] denotes positive finding; [   ] denotes negative finding  CARDIOVASCULAR:  [ ]  chest pain   [ ]  chest pressure   [ ]  palpitations   [ ]  orthopnea   [ ]  dyspnea on exertion   [ ]  claudication   [ ]  rest pain   [ ]  DVT   [ ]  phlebitis PULMONARY:   [ ]  productive cough   [ ]  asthma   [ ]  wheezing NEUROLOGIC:   [ ]  weakness  [ ]  paresthesias  [ ]  aphasia  [ ]  amaurosis  [ ]  dizziness HEMATOLOGIC:   [ ]  bleeding problems   [ ]  clotting disorders MUSCULOSKELETAL:  [ ]  joint pain   [ ]  joint swelling [ ]  leg swelling GASTROINTESTINAL: [ ]   blood in stool  [ ]   hematemesis GENITOURINARY:  [ ]   dysuria  [ ]   hematuria PSYCHIATRIC:  [ ]  history of major depression INTEGUMENTARY:  [ ]  rashes  [ ]  ulcers CONSTITUTIONAL:  [ ]  fever   [ ]  chills  PHYSICAL EXAM: Filed Vitals:   03/06/12 1545 03/06/12 1549  BP: 167/65 158/64  Pulse: 61 60  Resp: 16 12  Height: 5\' 10"  (1.778 m)   Weight: 206 lb 3.2 oz (93.532 kg)   SpO2: 99% 98%   Body mass index is 29.59 kg/(m^2). GENERAL: The patient is a well-nourished male, in no acute distress. The vital signs are documented above. CARDIOVASCULAR: There is a regular rate and rhythm. He has a right carotid bruit. Both feet are warm and well-perfused. PULMONARY: There is good air exchange bilaterally without wheezing or rales. ABDOMEN: Soft and non-tender with normal pitched bowel sounds.  MUSCULOSKELETAL: There are no major deformities or cyanosis. NEUROLOGIC: No focal weakness or paresthesias are detected. SKIN: There are no ulcers or rashes noted. PSYCHIATRIC: The patient has a normal affect.  DATA:  I have independently interpreted his carotid duplex scan which shows a 60-79% right carotid stenosis. The velocities on the right mesh improved slightly. He has no significant recurrent carotid stenosis on the left. Both vertebral arteries are patent with normally directed flow.  MEDICAL ISSUES:  Occlusion and stenosis of carotid artery without mention of cerebral infarction Patient has a  60-79% recurrent right carotid stenosis which has been stable for some time now. The velocities on the right had actually improved slightly since his last visit a year ago. He understands we would not consider redo right carotid endarterectomy was the stenosis progressed to greater  than 80% or he developed new right hemispheric symptoms. As a stenosis has been stable in the velocities improved slightly will continue his follow up at one year. He knows to call sooner if he has problems. In the meantime he knows to continue taking his aspirin.   DICKSON,CHRISTOPHER S [facility] [place] [facility]: [phone]

## 2012-03-06 NOTE — Assessment & Plan Note (Signed)
Patient has a 60-79% recurrent right carotid stenosis which has been stable for some time now. The velocities on the right had actually improved slightly since his last visit a year ago. He understands we would not consider redo right carotid endarterectomy was the stenosis progressed to greater than 80% or he developed new right hemispheric symptoms. As a stenosis has been stable in the velocities improved slightly will continue his follow up at one year. He knows to call sooner if he has problems. In the meantime he knows to continue taking his aspirin.

## 2012-03-10 ENCOUNTER — Other Ambulatory Visit: Payer: Self-pay | Admitting: Cardiovascular Disease

## 2012-03-11 NOTE — Telephone Encounter (Signed)
Fax Received. Refill Completed. [name] (R.M.A)   

## 2012-04-02 DIAGNOSIS — M67919 Unspecified disorder of synovium and tendon, unspecified shoulder: Secondary | ICD-10-CM | POA: Diagnosis not present

## 2012-04-02 DIAGNOSIS — M19019 Primary osteoarthritis, unspecified shoulder: Secondary | ICD-10-CM | POA: Diagnosis not present

## 2012-04-02 DIAGNOSIS — M719 Bursopathy, unspecified: Secondary | ICD-10-CM | POA: Diagnosis not present

## 2012-04-02 DIAGNOSIS — M76899 Other specified enthesopathies of unspecified lower limb, excluding foot: Secondary | ICD-10-CM | POA: Diagnosis not present

## 2012-04-03 NOTE — Telephone Encounter (Signed)
I called and spoke with the patient and his wife. Labs were redrawn and all is well. Thanks!

## 2012-04-08 ENCOUNTER — Telehealth: Payer: Self-pay | Admitting: *Deleted

## 2012-04-08 NOTE — Telephone Encounter (Signed)
Received request for cardiac clearance right shoulder procedure,, clearance given and faxed back to [facility] via MR.

## 2012-04-10 ENCOUNTER — Ambulatory Visit (INDEPENDENT_AMBULATORY_CARE_PROVIDER_SITE_OTHER): Payer: Medicare Other | Admitting: Cardiovascular Disease

## 2012-04-10 ENCOUNTER — Encounter: Payer: Self-pay | Admitting: Cardiovascular Disease

## 2012-04-10 VITALS — BP 124/76 | HR 62 | Ht 70.5 in | Wt 203.1 lb

## 2012-04-10 DIAGNOSIS — I10 Essential (primary) hypertension: Secondary | ICD-10-CM | POA: Diagnosis not present

## 2012-04-10 DIAGNOSIS — I2589 Other forms of chronic ischemic heart disease: Secondary | ICD-10-CM

## 2012-04-10 DIAGNOSIS — I6529 Occlusion and stenosis of unspecified carotid artery: Secondary | ICD-10-CM

## 2012-04-10 DIAGNOSIS — N289 Disorder of kidney and ureter, unspecified: Secondary | ICD-10-CM

## 2012-04-10 DIAGNOSIS — E785 Hyperlipidemia, unspecified: Secondary | ICD-10-CM

## 2012-04-10 NOTE — Assessment & Plan Note (Addendum)
Ms. Easten  is doing well. He has stable angina. He does not get much exercise. He also notes that his glucose levels have been somewhat elevated.  He has very minimal ST and T wave changes in his lateral leads. He has known stenosis in the small, severely diseased obtuse marginal arteries. This same territory showed a very mild amount of ischemia during his last Myoview study in November, 2012.  I don't think there is any further interventions that we can offer for this diffusely diseased.Marland Kitchen He's not having rest angina.  He needs to have shoulder surgery. I would put him at moderate to high risk for having cardiovascular complications. I think that he is well" tuned up" for this procedure and I don't think there is any further interventions that we can offer to help reduce his risk.  He will be allowed to hold his Plavix 5 days prior to his shoulder surgery. I would like for him to continue taking the aspirin given his history of severe coronary artery disease.   I se and fasting labs.en again in 6 was for followup office visit

## 2012-04-10 NOTE — Progress Notes (Signed)
[name] Date of Birth  [birth year] [facility] [address]    [address] [place], [place]  [address] [phone]  Fax  [phone]  History of Present Illness:  [name] is a 78 year old gentleman with a history of coronary artery disease and coronary artery bypass grafting. He recently presented with some right-sided shoulder pain that radiates through to his chest. He had a stress Myoview study which revealed a small inferolateral defect. He was set up for cardiac catheterization but his precath labs revealed a creatinine of 2.4 which was new. His baseline creatinine is between 1.6 and 1.8. We discontinued his HCTZ and his ACE inhibitor.  He was seen by his medical doctor who also stop his metformin.  We were going to do a cardiac catheterization this winter but we canceled Peter case because of his renal insufficiency. His metformin was stopped and he made some other medical changes in his creatinine has now improved. Fortunately, he's not having any further episodes of angina.  He informed me that his insurance company is no longer paying for Ranexa.  He's been bradycardic for years. We do not have him on beta blocker for that reason.  He's had some shoulder problems. He needs to have surgery on his right shoulder soon. He will eventually need to have surgery on his left shoulder. He had a low risk  Myoview study in November, 2012. He had an echocardiogram in August, 2013 that was also unchanged.  He does not follow a strict diabetic diet.  His glucose levels went very high after his steroid injection ( > 500)   Current Outpatient Prescriptions on File Prior to Visit  Medication Sig Dispense Refill  . allopurinol (ZYLOPRIM) 300 MG tablet Take 1 tablet (300 mg total) by mouth daily.  30 tablet  11  . amLODipine-benazepril (LOTREL) 10-20 MG per capsule Take 1 capsule by mouth daily.      Marland Kitchen aspirin 325 MG tablet Take 1 tablet (325 mg total) by mouth daily.  30 tablet   11  . atorvastatin (LIPITOR) 40 MG tablet Take 40 mg by mouth daily.        . cloNIDine (CATAPRES) 0.2 MG tablet Take 0.2 mg by mouth daily.   60 tablet  11  . clopidogrel (PLAVIX) 75 MG tablet Take 1 tablet (75 mg total) by mouth daily.  30 tablet  11  . fenofibrate 160 MG tablet TAKE ONE TABLET BY MOUTH EVERY DAY  30 tablet  5  . hydrochlorothiazide (HYDRODIURIL) 25 MG tablet Take 25 mg by mouth daily.      . insulin aspart (NOVOLOG) 100 UNIT/ML injection Inject 40 Units into Peter skin daily. Use as directed  10 mL  12  . insulin glargine (LANTUS) 100 UNIT/ML injection 20 units in Peter morning, 20 units in Peter evening  10 mL  12  . isosorbide mononitrate (IMDUR) 60 MG 24 hr tablet TAKE ONE TABLET BY MOUTH EVERY DAY  90 tablet  3  . levothyroxine (SYNTHROID) 150 MCG tablet Take 1 tablet (150 mcg total) by mouth daily.  30 tablet  11  . metFORMIN (GLUCOPHAGE) 850 MG tablet Take 850 mg by mouth 2 (two) times daily with a meal.      . Multiple Vitamins-Minerals (MULTIVITAL PLATINUM) TABS One po daily   30 each  0  . nitroGLYCERIN (NITROSTAT) 0.4 MG SL tablet Place 0.4 mg under Peter tongue every 5 (five) minutes as needed.      Marland Kitchen  ranitidine (ZANTAC) 300 MG tablet Take 300 mg by mouth 2 (two) times daily.   30 tablet  11    Allergies  Allergen Reactions  . Betadine (Povidone Iodine)   . Iodinated Diagnostic Agents Rash    Does fine with premedications for cath    Past Medical History  Diagnosis Date  . Hypertension   . Hyperlipidemia   . Thyroid disease   . CAD (coronary artery disease)     with prior CABG in 1997 with redo in 2000; last cath in 2008 - managed medically  . Type 2 diabetes mellitus      with fair control  . Hypothyroidism   . Generalized weakness     without overt findings  . Dehydration     Mild dehydration, resolved  . Peripheral vascular disease   . Carotid disease, bilateral     with multiple bilateral carotid surgeries  . Psoriasis   . Meniere's disease   .  Degenerative joint disease   . Gout   . Orthopnea     Two-pillow  . CRI (chronic renal insufficiency)     Past Surgical History  Procedure Date  . Coronary artery bypass graft 1997 and 2000  . Cardiac catheterization   . Carotid endarterectomy   . Lung removal, partial   . Laminectomies July 2001    lumbar laminectomies  . Thoracotomy      left--due to fungal infection  . Shoulder surgery     History  Smoking status  . Never Smoker   Smokeless tobacco  . Not on file    History  Alcohol Use No    Family History  Problem Relation Age of Onset  . Heart attack Father   . Heart disease Father   . Hyperlipidemia Father   . Diabetes Mother   . Heart disease Brother     Reviw of Systems:  Reviewed in Peter HPI.  All other systems are negative.  Physical Exam: BP 124/76  Pulse 62  Ht 5' 10.5" (1.791 m)  Wt 203 lb 1.9 oz (92.135 kg)  BMI 28.73 kg/m2  SpO2 96% [name] is alert and oriented x 3.  Peter mood and affect are normal.   Skin: warm and dry.  Color is normal.    HEENT:   Normocephalic/atraumatic. Mucous membranes are normal.  Lungs: Lungs are clear.   Heart: Regular rate S1-S2. Has a 2-3/6 systolic ejection murmur cholesterol border.    Abdomen: His abdominal exam is benign.  Extremities:  No clubbing cyanosis or edema  Neuro:  Exam is nonfocal. Gait is normal. ECG: 04/10/2012-normal sinus rhythm at 62 beats a minute. He has an incomplete right bundle branch block. He has ST and T wave changes in Peter inferolateral leads.  These ST and T wave changes are slightly worse since his previous EKG.  He has known disease in his marginal system. He has small and severely diseased obtuse marginal arteries by heart catheterization in 2008. His last Myoview study revealed a small amount of lateral ischemia. I suspected a small amount of lateral ischemia is due to Peter small diffusely diseased marginal branches and is also responsible for these very mild ST-T wave  changes in Peter lateral leads.    Assessment / Plan:

## 2012-04-10 NOTE — Patient Instructions (Addendum)
Your physician wants you to follow-up in: 6 MONTHS  You will receive a reminder letter in the mail two months in advance. If you don't receive a letter, please call our office to schedule the follow-up appointment.   Your physician recommends that you return for a FASTING lipid profile: 6 MONTHS   YOU ARE CLEARED FOR SHOULDER SURGERY, WE WILL BE WATCHING FOR CLEARANCE REQUEST FROM YOUR SURGEON.

## 2012-04-23 DIAGNOSIS — M719 Bursopathy, unspecified: Secondary | ICD-10-CM | POA: Diagnosis not present

## 2012-04-23 DIAGNOSIS — M67919 Unspecified disorder of synovium and tendon, unspecified shoulder: Secondary | ICD-10-CM | POA: Diagnosis not present

## 2012-04-24 DIAGNOSIS — Z23 Encounter for immunization: Secondary | ICD-10-CM | POA: Diagnosis not present

## 2012-04-25 ENCOUNTER — Encounter: Payer: Self-pay | Admitting: Cardiovascular Disease

## 2012-04-26 ENCOUNTER — Encounter: Payer: Self-pay | Admitting: Cardiovascular Disease

## 2012-05-14 ENCOUNTER — Other Ambulatory Visit: Payer: Self-pay | Admitting: *Deleted

## 2012-05-14 MED ORDER — AMLODIPINE BESYLATE 10 MG PO TABS: 10.0000 mg | ORAL_TABLET | Freq: Every day | ORAL | Status: DC

## 2012-05-16 ENCOUNTER — Other Ambulatory Visit: Payer: Self-pay | Admitting: *Deleted

## 2012-05-16 DIAGNOSIS — I6529 Occlusion and stenosis of unspecified carotid artery: Secondary | ICD-10-CM

## 2012-05-16 DIAGNOSIS — Z48812 Encounter for surgical aftercare following surgery on the circulatory system: Secondary | ICD-10-CM

## 2012-05-16 NOTE — Telephone Encounter (Signed)
Opened in Error.

## 2012-05-29 DIAGNOSIS — M719 Bursopathy, unspecified: Secondary | ICD-10-CM | POA: Diagnosis not present

## 2012-05-30 DIAGNOSIS — I2510 Atherosclerotic heart disease of native coronary artery without angina pectoris: Secondary | ICD-10-CM | POA: Diagnosis not present

## 2012-05-30 DIAGNOSIS — E1159 Type 2 diabetes mellitus with other circulatory complications: Secondary | ICD-10-CM | POA: Diagnosis not present

## 2012-05-30 DIAGNOSIS — N189 Chronic kidney disease, unspecified: Secondary | ICD-10-CM | POA: Diagnosis not present

## 2012-05-30 DIAGNOSIS — I10 Essential (primary) hypertension: Secondary | ICD-10-CM | POA: Diagnosis not present

## 2012-06-13 DIAGNOSIS — H2510 Age-related nuclear cataract, unspecified eye: Secondary | ICD-10-CM | POA: Diagnosis not present

## 2012-06-13 DIAGNOSIS — E11339 Type 2 diabetes mellitus with moderate nonproliferative diabetic retinopathy without macular edema: Secondary | ICD-10-CM | POA: Diagnosis not present

## 2012-06-13 DIAGNOSIS — H35369 Drusen (degenerative) of macula, unspecified eye: Secondary | ICD-10-CM | POA: Diagnosis not present

## 2012-06-13 DIAGNOSIS — E1139 Type 2 diabetes mellitus with other diabetic ophthalmic complication: Secondary | ICD-10-CM | POA: Diagnosis not present

## 2012-07-05 DIAGNOSIS — J209 Acute bronchitis, unspecified: Secondary | ICD-10-CM | POA: Diagnosis not present

## 2012-07-05 DIAGNOSIS — R062 Wheezing: Secondary | ICD-10-CM | POA: Diagnosis not present

## 2012-07-05 DIAGNOSIS — R05 Cough: Secondary | ICD-10-CM | POA: Diagnosis not present

## 2012-07-15 DIAGNOSIS — I2510 Atherosclerotic heart disease of native coronary artery without angina pectoris: Secondary | ICD-10-CM | POA: Diagnosis not present

## 2012-07-15 DIAGNOSIS — N189 Chronic kidney disease, unspecified: Secondary | ICD-10-CM | POA: Diagnosis not present

## 2012-07-15 DIAGNOSIS — R05 Cough: Secondary | ICD-10-CM | POA: Diagnosis not present

## 2012-08-01 DIAGNOSIS — I10 Essential (primary) hypertension: Secondary | ICD-10-CM | POA: Diagnosis not present

## 2012-09-04 DIAGNOSIS — I10 Essential (primary) hypertension: Secondary | ICD-10-CM | POA: Diagnosis not present

## 2012-09-06 DIAGNOSIS — I10 Essential (primary) hypertension: Secondary | ICD-10-CM | POA: Diagnosis not present

## 2012-09-12 ENCOUNTER — Other Ambulatory Visit: Payer: Self-pay | Admitting: *Deleted

## 2012-09-12 MED ORDER — FENOFIBRATE 160 MG PO TABS: 160.0000 mg | ORAL_TABLET | Freq: Every day | ORAL | Status: DC

## 2012-09-12 NOTE — Telephone Encounter (Signed)
Fax Received. Refill Completed. [name] (R.M.A)   

## 2012-09-24 DIAGNOSIS — N184 Chronic kidney disease, stage 4 (severe): Secondary | ICD-10-CM | POA: Diagnosis not present

## 2012-09-24 DIAGNOSIS — I10 Essential (primary) hypertension: Secondary | ICD-10-CM | POA: Diagnosis not present

## 2012-09-24 DIAGNOSIS — I129 Hypertensive chronic kidney disease with stage 1 through stage 4 chronic kidney disease, or unspecified chronic kidney disease: Secondary | ICD-10-CM | POA: Diagnosis not present

## 2012-09-27 ENCOUNTER — Ambulatory Visit (INDEPENDENT_AMBULATORY_CARE_PROVIDER_SITE_OTHER): Payer: Medicare Other | Admitting: *Deleted

## 2012-09-27 ENCOUNTER — Ambulatory Visit (INDEPENDENT_AMBULATORY_CARE_PROVIDER_SITE_OTHER): Payer: Medicare Other | Admitting: Cardiovascular Disease

## 2012-09-27 ENCOUNTER — Encounter: Payer: Self-pay | Admitting: Cardiovascular Disease

## 2012-09-27 ENCOUNTER — Other Ambulatory Visit: Payer: Self-pay | Admitting: Nephrology

## 2012-09-27 VITALS — BP 126/76 | HR 62 | Ht 70.5 in | Wt 206.0 lb

## 2012-09-27 DIAGNOSIS — I10 Essential (primary) hypertension: Secondary | ICD-10-CM | POA: Diagnosis not present

## 2012-09-27 DIAGNOSIS — I2589 Other forms of chronic ischemic heart disease: Secondary | ICD-10-CM

## 2012-09-27 DIAGNOSIS — I509 Heart failure, unspecified: Secondary | ICD-10-CM

## 2012-09-27 DIAGNOSIS — N184 Chronic kidney disease, stage 4 (severe): Secondary | ICD-10-CM

## 2012-09-27 LAB — BASIC METABOLIC PANEL
Chloride: 100 mEq/L (ref 96–112)
Creatinine, Ser: 2.8 mg/dL — ABNORMAL HIGH (ref 0.4–1.5)
GFR: 23.89 mL/min — ABNORMAL LOW (ref >60.00)

## 2012-09-27 LAB — HEPATIC FUNCTION PANEL
ALT: 17 U/L (ref 0–53)
AST: 25 U/L (ref 0–37)
Albumin: 4.2 g/dL (ref 3.5–5.2)
Alkaline Phosphatase: 46 U/L (ref 39–117)
Total Protein: 7.2 g/dL (ref 6.0–8.3)

## 2012-09-27 LAB — LIPID PANEL
Cholesterol: 192 mg/dL (ref 0–200)
Total CHOL/HDL Ratio: 6
Triglycerides: 211 mg/dL — ABNORMAL HIGH (ref 0.0–149.0)

## 2012-09-27 NOTE — Assessment & Plan Note (Signed)
His blood pressure is well-controlled 

## 2012-09-27 NOTE — Patient Instructions (Addendum)
Your physician wants you to follow-up in: 6 months  You will receive a reminder letter in the mail two months in advance. If you don't receive a letter, please call our office to schedule the follow-up appointment.  Your physician recommends that you continue on your current medications as directed. Please refer to the Current Medication list given to you today.  REDUCE HIGH SODIUM FOODS LIKE CANNED SOUP, GRAVY, SAUCES, READY PREPARED FOODS LIKE FROZEN FOODS; LEAN CUISINE, LASAGNA. BACON, SAUSAGE, LUNCH MEAT, FAST FOODS.Marland Kitchen Hotdogs and chips   DASH Diet The DASH diet stands for "Dietary Approaches to Stop Hypertension." It is a healthy eating plan that has been shown to reduce high blood pressure (hypertension) in as little as 14 days, while also possibly providing other significant health benefits. These other health benefits include reducing the risk of breast cancer after menopause and reducing the risk of type 2 diabetes, heart disease, colon cancer, and stroke. Health benefits also include weight loss and slowing kidney failure in patients with chronic kidney disease.  DIET GUIDELINES  Limit salt (sodium). Your diet should contain less than 1500 mg of sodium daily.  Limit refined or processed carbohydrates. Your diet should include mostly whole grains. Desserts and added sugars should be used sparingly.  Include small amounts of heart-healthy fats. These types of fats include nuts, oils, and tub margarine. Limit saturated and trans fats. These fats have been shown to be harmful in the body. CHOOSING FOODS  The following food groups are based on a 2000 calorie diet. See your Registered Dietitian for individual calorie needs. Grains and Grain Products (6 to 8 servings daily)  Eat More Often: Whole-wheat bread, brown rice, whole-grain or wheat pasta, quinoa, popcorn without added fat or salt (air popped).  Eat Less Often: White bread, white pasta, white rice, cornbread. Vegetables (4 to 5  servings daily)  Eat More Often: Fresh, frozen, and canned vegetables. Vegetables may be raw, steamed, roasted, or grilled with a minimal amount of fat.  Eat Less Often/Avoid: Creamed or fried vegetables. Vegetables in a cheese sauce. Fruit (4 to 5 servings daily)  Eat More Often: All fresh, canned (in natural juice), or frozen fruits. Dried fruits without added sugar. One hundred percent fruit juice ( cup [237 mL] daily).  Eat Less Often: Dried fruits with added sugar. Canned fruit in light or heavy syrup. [employer], Fish, and Poultry (2 servings or less daily. One serving is 3 to 4 oz [85-114 g]).  Eat More Often: Ninety percent or leaner ground beef, tenderloin, sirloin. Round cuts of beef, chicken breast, [place] breast. All fish. Grill, bake, or broil your meat. Nothing should be fried.  Eat Less Often/Avoid: Fatty cuts of meat, [place], or chicken leg, thigh, or wing. Fried cuts of meat or fish. Dairy (2 to 3 servings)  Eat More Often: Low-fat or fat-free milk, low-fat plain or light yogurt, reduced-fat or part-skim cheese.  Eat Less Often/Avoid: Milk (whole, 2%).Whole milk yogurt. Full-fat cheeses. Nuts, Seeds, and Legumes (4 to 5 servings per week)  Eat More Often: All without added salt.  Eat Less Often/Avoid: Salted nuts and seeds, canned beans with added salt. Fats and Sweets (limited)  Eat More Often: Vegetable oils, tub margarines without trans fats, sugar-free gelatin. Mayonnaise and salad dressings.  Eat Less Often/Avoid: Coconut oils, palm oils, butter, stick margarine, cream, half and half, cookies, candy, pie. FOR MORE INFORMATION The Dash Diet Eating Plan: www.dashdiet.org Document Released: 06/01/2011 Document Revised: 09/04/2011 Document Reviewed: 06/01/2011 Exit[employer] Patient  Information 2013 [place], [place].

## 2012-09-27 NOTE — Assessment & Plan Note (Signed)
Pt is doing well.  No angina.  He has some chronic dyspnea which seems to be worse now. He does not get any exercise. I have encouraged him to exercise on regular basis.  I'll see him again in 6 months.

## 2012-09-27 NOTE — Progress Notes (Signed)
[name] Date of Birth  1934-01-01 [facility] [address]    [address] [place], [place]  [address] [phone]  Fax  [phone]  History of Present Illness:  [name] is a 79 year old gentleman with a history of coronary artery disease and coronary artery bypass grafting. He recently presented with some right-sided shoulder pain that radiates through to his chest. He had a stress Myoview study which revealed a small inferolateral defect. He was set up for cardiac catheterization but his precath labs revealed a creatinine of 2.4 which was new. His baseline creatinine is between 1.6 and 1.8. We discontinued his HCTZ and his ACE inhibitor.  He was seen by his medical doctor who also stop his metformin.  We were going to do a cardiac catheterization this winter but we canceled the case because of his renal insufficiency. His metformin was stopped and he made some other medical changes in his creatinine has now improved. Fortunately, he's not having any further episodes of angina.  He informed me that his insurance company is no longer paying for Ranexa.  He's been bradycardic for years. We do not have him on beta blocker for that reason.  He's had some shoulder problems. He needs to have surgery on his right shoulder soon. He will eventually need to have surgery on his left shoulder. He had a low risk  Myoview study in November, 2012. He had an echocardiogram in August, 2013 that was also unchanged.  He does not follow a strict diabetic diet.  His glucose levels went very high after his steroid injection ( > 500)  September 27, 2012:  Pt is doing well.  He denies any chest pain. Has some shoulder soreness.  He does have chronic dyspnea and has had worsening dyspnea since he had the flu in January.  He stays away from salt.     Current Outpatient Prescriptions on File Prior to Visit  Medication Sig Dispense Refill  . allopurinol (ZYLOPRIM) 300 MG tablet Take 1 tablet  (300 mg total) by mouth daily.  30 tablet  11  . amLODipine (NORVASC) 10 MG tablet Take 1 tablet (10 mg total) by mouth daily.  30 tablet  5  . aspirin 325 MG tablet Take 1 tablet (325 mg total) by mouth daily.  30 tablet  11  . atorvastatin (LIPITOR) 40 MG tablet Take 40 mg by mouth daily.        . cloNIDine (CATAPRES) 0.2 MG tablet Take 0.2 mg by mouth daily.   60 tablet  11  . clopidogrel (PLAVIX) 75 MG tablet Take 1 tablet (75 mg total) by mouth daily.  30 tablet  11  . fenofibrate 160 MG tablet Take 1 tablet (160 mg total) by mouth daily.  30 tablet  5  . insulin aspart (NOVOLOG) 100 UNIT/ML injection Inject 40 Units into the skin daily. Use as directed  10 mL  12  . insulin glargine (LANTUS) 100 UNIT/ML injection 20 units in the morning, 20 units in the evening  10 mL  12  . isosorbide mononitrate (IMDUR) 60 MG 24 hr tablet TAKE ONE TABLET BY MOUTH EVERY DAY  90 tablet  3  . levothyroxine (SYNTHROID) 150 MCG tablet Take 1 tablet (150 mcg total) by mouth daily.  30 tablet  11  . Multiple Vitamins-Minerals (MULTIVITAL PLATINUM) TABS One po daily   30 each  0  . nitroGLYCERIN (NITROSTAT) 0.4 MG SL tablet Place 0.4 mg under the tongue every  5 (five) minutes as needed.      . ranitidine (ZANTAC) 300 MG tablet Take 300 mg by mouth 2 (two) times daily.   30 tablet  11   No current facility-administered medications on file prior to visit.    Allergies  Allergen Reactions  . Betadine (Povidone Iodine)   . Iodinated Diagnostic Agents Rash    Does fine with premedications for cath    Past Medical History  Diagnosis Date  . Hypertension   . Hyperlipidemia   . Thyroid disease   . CAD (coronary artery disease)     with prior CABG in 1997 with redo in 2000; last cath in 2008 - managed medically  . Type 2 diabetes mellitus      with fair control  . Hypothyroidism   . Generalized weakness     without overt findings  . Dehydration     Mild dehydration, resolved  . Peripheral vascular  disease   . Carotid disease, bilateral     with multiple bilateral carotid surgeries  . Psoriasis   . Meniere's disease   . Degenerative joint disease   . Gout   . Orthopnea     Two-pillow  . CRI (chronic renal insufficiency)     Past Surgical History  Procedure Laterality Date  . Coronary artery bypass graft  1997 and 2000  . Cardiac catheterization    . Carotid endarterectomy    . Lung removal, partial    . Laminectomies  July 2001    lumbar laminectomies  . Thoracotomy       left--due to fungal infection  . Shoulder surgery      History  Smoking status  . Never Smoker   Smokeless tobacco  . Not on file    History  Alcohol Use No    Family History  Problem Relation Age of Onset  . Heart attack Father   . Heart disease Father   . Hyperlipidemia Father   . Diabetes Mother   . Heart disease Brother     Reviw of Systems:  Reviewed in the HPI.  All other systems are negative.  Physical Exam: BP 126/76  Pulse 62  Ht 5' 10.5" (1.791 m)  Wt 206 lb (93.441 kg)  BMI 29.13 kg/m2  SpO2 96% The patient is alert and oriented x 3.  The mood and affect are normal.   Skin: warm and dry.  Color is normal.    HEENT:   Normocephalic/atraumatic. Mucous membranes are normal.  Lungs: Lungs are clear.   Heart: Regular rate S1-S2. Has a 2-3/6 systolic ejection murmur cholesterol border.    Abdomen: His abdominal exam is benign.  Extremities:  No clubbing cyanosis or edema  Neuro:  Exam is nonfocal. Gait is normal. ECG: 04/10/2012-normal sinus rhythm at 62 beats a minute. He has an incomplete right bundle branch block. He has ST and T wave changes in the inferolateral leads.  These ST and T wave changes are slightly worse since his previous EKG.  He has known disease in his marginal system. He has small and severely diseased obtuse marginal arteries by heart catheterization in 2008. His last Myoview study revealed a small amount of lateral ischemia. I suspected a small  amount of lateral ischemia is due to the small diffusely diseased marginal branches and is also responsible for these very mild ST-T wave changes in the lateral leads.  Assessment / Plan:

## 2012-10-03 ENCOUNTER — Ambulatory Visit
Admission: RE | Admit: 2012-10-03 | Discharge: 2012-10-03 | Disposition: A | Discharge status: Home / Self Care | Payer: Medicare Other | Source: Ambulatory Visit | Attending: Nephrology | Admitting: Nephrology

## 2012-10-03 DIAGNOSIS — N184 Chronic kidney disease, stage 4 (severe): Secondary | ICD-10-CM

## 2012-10-03 DIAGNOSIS — N189 Chronic kidney disease, unspecified: Secondary | ICD-10-CM | POA: Diagnosis not present

## 2012-10-16 DIAGNOSIS — G309 Alzheimer's disease, unspecified: Secondary | ICD-10-CM | POA: Diagnosis not present

## 2012-10-16 DIAGNOSIS — I739 Peripheral vascular disease, unspecified: Secondary | ICD-10-CM | POA: Diagnosis not present

## 2012-10-16 DIAGNOSIS — Z6829 Body mass index (BMI) 29.0-29.9, adult: Secondary | ICD-10-CM | POA: Diagnosis not present

## 2012-10-16 DIAGNOSIS — Z1331 Encounter for screening for depression: Secondary | ICD-10-CM | POA: Diagnosis not present

## 2012-10-16 DIAGNOSIS — F0280 Dementia in other diseases classified elsewhere without behavioral disturbance: Secondary | ICD-10-CM | POA: Diagnosis not present

## 2012-10-16 DIAGNOSIS — H9190 Unspecified hearing loss, unspecified ear: Secondary | ICD-10-CM | POA: Diagnosis not present

## 2012-10-16 DIAGNOSIS — E1159 Type 2 diabetes mellitus with other circulatory complications: Secondary | ICD-10-CM | POA: Diagnosis not present

## 2012-10-16 DIAGNOSIS — I10 Essential (primary) hypertension: Secondary | ICD-10-CM | POA: Diagnosis not present

## 2012-10-30 DIAGNOSIS — N184 Chronic kidney disease, stage 4 (severe): Secondary | ICD-10-CM | POA: Diagnosis not present

## 2012-11-03 ENCOUNTER — Other Ambulatory Visit: Payer: Self-pay | Admitting: Cardiology

## 2012-11-08 DIAGNOSIS — M1710 Unilateral primary osteoarthritis, unspecified knee: Secondary | ICD-10-CM | POA: Diagnosis not present

## 2012-11-11 ENCOUNTER — Other Ambulatory Visit: Payer: Self-pay | Admitting: *Deleted

## 2012-11-11 MED ORDER — AMLODIPINE BESYLATE 10 MG PO TABS: 10.0000 mg | ORAL_TABLET | Freq: Every day | ORAL | Status: DC

## 2012-11-11 NOTE — Telephone Encounter (Signed)
Fax Received. Refill Completed. Lashai Grosch Chowoe (R.M.A)   

## 2012-11-27 DIAGNOSIS — E1129 Type 2 diabetes mellitus with other diabetic kidney complication: Secondary | ICD-10-CM | POA: Diagnosis not present

## 2012-11-27 DIAGNOSIS — N189 Chronic kidney disease, unspecified: Secondary | ICD-10-CM | POA: Diagnosis not present

## 2012-11-27 DIAGNOSIS — I10 Essential (primary) hypertension: Secondary | ICD-10-CM | POA: Diagnosis not present

## 2012-12-06 DIAGNOSIS — N184 Chronic kidney disease, stage 4 (severe): Secondary | ICD-10-CM | POA: Diagnosis not present

## 2012-12-06 DIAGNOSIS — I10 Essential (primary) hypertension: Secondary | ICD-10-CM | POA: Diagnosis not present

## 2012-12-06 DIAGNOSIS — I129 Hypertensive chronic kidney disease with stage 1 through stage 4 chronic kidney disease, or unspecified chronic kidney disease: Secondary | ICD-10-CM | POA: Diagnosis not present

## 2012-12-13 ENCOUNTER — Other Ambulatory Visit: Payer: Self-pay

## 2012-12-13 DIAGNOSIS — Z01810 Encounter for preprocedural cardiovascular examination: Secondary | ICD-10-CM

## 2012-12-13 DIAGNOSIS — N186 End stage renal disease: Secondary | ICD-10-CM

## 2012-12-19 DIAGNOSIS — E1139 Type 2 diabetes mellitus with other diabetic ophthalmic complication: Secondary | ICD-10-CM | POA: Diagnosis not present

## 2012-12-19 DIAGNOSIS — E11339 Type 2 diabetes mellitus with moderate nonproliferative diabetic retinopathy without macular edema: Secondary | ICD-10-CM | POA: Diagnosis not present

## 2012-12-19 DIAGNOSIS — H35079 Retinal telangiectasis, unspecified eye: Secondary | ICD-10-CM | POA: Diagnosis not present

## 2012-12-19 DIAGNOSIS — H35049 Retinal micro-aneurysms, unspecified, unspecified eye: Secondary | ICD-10-CM | POA: Diagnosis not present

## 2013-01-06 DIAGNOSIS — E1139 Type 2 diabetes mellitus with other diabetic ophthalmic complication: Secondary | ICD-10-CM | POA: Diagnosis not present

## 2013-01-06 DIAGNOSIS — E11311 Type 2 diabetes mellitus with unspecified diabetic retinopathy with macular edema: Secondary | ICD-10-CM | POA: Diagnosis not present

## 2013-01-14 ENCOUNTER — Encounter: Payer: Self-pay | Admitting: Vascular Surgery

## 2013-01-15 ENCOUNTER — Encounter: Payer: Self-pay | Admitting: *Deleted

## 2013-01-15 ENCOUNTER — Ambulatory Visit (INDEPENDENT_AMBULATORY_CARE_PROVIDER_SITE_OTHER): Payer: Medicare Other | Admitting: Vascular Surgery

## 2013-01-15 ENCOUNTER — Other Ambulatory Visit: Payer: Self-pay | Admitting: *Deleted

## 2013-01-15 ENCOUNTER — Encounter: Payer: Self-pay | Admitting: Vascular Surgery

## 2013-01-15 ENCOUNTER — Encounter (INDEPENDENT_AMBULATORY_CARE_PROVIDER_SITE_OTHER): Payer: Medicare Other | Admitting: *Deleted

## 2013-01-15 VITALS — BP 132/71 | HR 59 | Ht 70.0 in | Wt 208.1 lb

## 2013-01-15 DIAGNOSIS — N186 End stage renal disease: Secondary | ICD-10-CM

## 2013-01-15 DIAGNOSIS — Z01810 Encounter for preprocedural cardiovascular examination: Secondary | ICD-10-CM | POA: Diagnosis not present

## 2013-01-15 NOTE — Progress Notes (Signed)
[facility]  Patient name: [name] MRN: [id number] DOB: 1934/05/15 Sex: male  REASON FOR VISIT: evaluate for hemodialysis access. Referred by Dr. Camille [name]  HPI: [name] is a 79 y.o. male who is not yet on dialysis. He is right-handed. He was sent for evaluation for hemodialysis access. I have reviewed his records from Dr. Eliott [name] is office. He has stage IV chronic kidney disease. This is likely secondary to diabetes. In addition he has hypertension which is been under good control.  He denies any uremic symptoms. Specifically he denies nausea, vomiting, anorexia, or palpitations. He has had some fatigue.  Past Medical History  Diagnosis Date  . Hypertension   . Hyperlipidemia   . Thyroid disease   . CAD (coronary artery disease)     with prior CABG in 1997 with redo in 2000; last cath in 2008 - managed medically  . Type 2 diabetes mellitus      with fair control  . Hypothyroidism   . Generalized weakness     without overt findings  . Dehydration     Mild dehydration, resolved  . Peripheral vascular disease   . Carotid disease, bilateral     with multiple bilateral carotid surgeries  . Psoriasis   . Meniere's disease   . Degenerative joint disease   . Gout   . Orthopnea     Two-pillow  . CRI (chronic renal insufficiency)   . CHF (congestive heart failure)    Family History  Problem Relation Age of Onset  . Heart attack Father   . Heart disease Father   . Hyperlipidemia Father   . Hypertension Father   . Diabetes Mother   . Hypertension Mother   . Heart disease Brother   . Diabetes Brother    SOCIAL HISTORY: History  Substance Use Topics  . Smoking status: Never Smoker   . Smokeless tobacco: Not on file  . Alcohol Use: No   Allergies  Allergen Reactions  . Betadine (Povidone Iodine)   . Iodinated Diagnostic Agents Rash    Does fine with premedications for cath   Current Outpatient Prescriptions  Medication  Sig Dispense Refill  . allopurinol (ZYLOPRIM) 300 MG tablet Take 1 tablet (300 mg total) by mouth daily.  30 tablet  11  . amLODipine (NORVASC) 10 MG tablet Take 1 tablet (10 mg total) by mouth daily.  30 tablet  5  . aspirin 325 MG tablet Take 1 tablet (325 mg total) by mouth daily.  30 tablet  11  . atorvastatin (LIPITOR) 40 MG tablet Take 40 mg by mouth daily.        . cholecalciferol (VITAMIN D) 1000 UNITS tablet Take 1,000 Units by mouth daily.      . cloNIDine (CATAPRES) 0.2 MG tablet Take 0.2 mg by mouth daily.   60 tablet  11  . clopidogrel (PLAVIX) 75 MG tablet Take 1 tablet (75 mg total) by mouth daily.  30 tablet  11  . fenofibrate 160 MG tablet Take 1 tablet (160 mg total) by mouth daily.  30 tablet  5  . insulin aspart (NOVOLOG) 100 UNIT/ML injection Inject 40 Units into the skin daily. Use as directed  10 mL  12  . insulin glargine (LANTUS) 100 UNIT/ML injection 20 units in the morning, 20 units in the evening  10 mL  12  . isosorbide mononitrate (IMDUR) 60 MG 24 hr tablet TAKE ONE TABLET BY MOUTH EVERY DAY  90  tablet  1  . levothyroxine (SYNTHROID) 150 MCG tablet Take 1 tablet (150 mcg total) by mouth daily.  30 tablet  11  . Multiple Vitamins-Minerals (MULTIVITAL PLATINUM) TABS One po daily   30 each  0  . nitroGLYCERIN (NITROSTAT) 0.4 MG SL tablet Place 0.4 mg under the tongue every 5 (five) minutes as needed.      . ranitidine (ZANTAC) 300 MG tablet Take 300 mg by mouth 2 (two) times daily.   30 tablet  11   No current facility-administered medications for this visit.   REVIEW OF SYSTEMS: Arly.Keller ] denotes positive finding; [  ] denotes negative finding  CARDIOVASCULAR:  [ ]  chest pain   [ ]  chest pressure   [ ]  palpitations   [ ]  orthopnea   [ ]  dyspnea on exertion   [ ]  claudication   [ ]  rest pain   [ ]  DVT   [ ]  phlebitis PULMONARY:   [ ]  productive cough   [ ]  asthma   [ ]  wheezing NEUROLOGIC:   [ ]  weakness  [ ]  paresthesias  [ ]  aphasia  [ ]  amaurosis  [ ]   dizziness HEMATOLOGIC:   [ ]  bleeding problems   [ ]  clotting disorders MUSCULOSKELETAL:  [ ]  joint pain   [ ]  joint swelling [ ]  leg swelling GASTROINTESTINAL: [ ]   blood in stool  [ ]   hematemesis GENITOURINARY:  [ ]   dysuria  [ ]   hematuria PSYCHIATRIC:  [ ]  history of major depression INTEGUMENTARY:  [ ]  rashes  [ ]  ulcers CONSTITUTIONAL:  [ ]  fever   [ ]  chills  PHYSICAL EXAM: Filed Vitals:   01/15/13 0936  BP: 132/71  Pulse: 59  Height: 5\' 10"  (1.778 m)  Weight: 208 lb 1.6 oz (94.394 kg)  SpO2: 98%   Body mass index is 29.86 kg/(m^2). GENERAL: The patient is a well-nourished male, in no acute distress. The vital signs are documented above. CARDIOVASCULAR: There is a regular rate and rhythm. He has a right carotid bruit. He has a systolic ejection murmur. He has palpable radial pulses bilaterally. PULMONARY: There is good air exchange bilaterally without wheezing or rales. ABDOMEN: Soft and non-tender with normal pitched bowel sounds.  MUSCULOSKELETAL: There are no major deformities or cyanosis. NEUROLOGIC: No focal weakness or paresthesias are detected. SKIN: There are no ulcers or rashes noted. PSYCHIATRIC: The patient has a normal affect.  DATA:  I have independently interpreted his vein mapping today. He appears to have a usable cephalic and basilic veins bilaterally.  MEDICAL ISSUES:  End stage renal disease This patient appears to be a good candidate for an AV fistula in the left arm. I have explained the indications for placement of an AV fistula or AV graft. I've explained that if at all possible we will place an AV fistula.  I have reviewed the risks of placement of an AV fistula including but not limited to: failure of the fistula to mature, need for subsequent interventions, and thrombosis. All the patient's questions were answered and they are agreeable to proceed with surgery. The surgery is scheduled for 01/21/2013.   In addition, this patient has a history of  carotid disease and is being followed with a recurrent right carotid stenosis which is in the 60-79% range. His last study was in September 2013. I offered to have him have a study done today and he would prefer to wait until September so we will schedule this. In the meantime  he is continue taking his Plavix. He has been asymptomatic from this standpoint   DICKSON,CHRISTOPHER S [facility] [place] [facility]: [phone]

## 2013-01-15 NOTE — Assessment & Plan Note (Signed)
This patient appears to be a good candidate for an AV fistula in the left arm. I have explained the indications for placement of an AV fistula or AV graft. I've explained that if at all possible we will place an AV fistula.  I have reviewed the risks of placement of an AV fistula including but not limited to: failure of the fistula to mature, need for subsequent interventions, and thrombosis. All the patient's questions were answered and they are agreeable to proceed with surgery. The surgery is scheduled for 01/21/2013.

## 2013-01-16 ENCOUNTER — Encounter (HOSPITAL_COMMUNITY): Payer: Self-pay | Admitting: Pharmacy Technician

## 2013-01-16 ENCOUNTER — Encounter: Payer: Self-pay | Admitting: Nephrology

## 2013-01-17 ENCOUNTER — Other Ambulatory Visit: Payer: Self-pay | Admitting: *Deleted

## 2013-01-20 ENCOUNTER — Encounter (HOSPITAL_COMMUNITY): Payer: Self-pay | Admitting: *Deleted

## 2013-01-20 MED ORDER — CEFUROXIME SODIUM 1.5 G IJ SOLR
1.5000 g | INTRAMUSCULAR | Status: AC
  Administered 2013-01-21: 1.5 g via INTRAVENOUS
  Filled 2013-01-20: qty 1.5

## 2013-01-20 NOTE — Progress Notes (Addendum)
Pt asked if dental procedure would interfere with surgery tomorrow; pt advised to call Dr. Adele Dan office for response to question.Pt is under the care of Dr. Marti Sleigh, cardiologist at [facility]. Pt made aware to stop taking Aspirin, Plavix, and herbal medications. Do not take any NSAIDs ie: Ibuprofen, Advil, Naproxen or any medication containing Aspirin.

## 2013-01-21 ENCOUNTER — Encounter (HOSPITAL_COMMUNITY)
Admission: RE | Disposition: A | Discharge status: Home / Self Care | Payer: Self-pay | Source: Ambulatory Visit | Attending: Vascular Surgery

## 2013-01-21 ENCOUNTER — Ambulatory Visit (HOSPITAL_COMMUNITY): Payer: Medicare Other | Admitting: Certified Registered"

## 2013-01-21 ENCOUNTER — Encounter (HOSPITAL_COMMUNITY): Payer: Self-pay | Admitting: *Deleted

## 2013-01-21 ENCOUNTER — Ambulatory Visit (HOSPITAL_COMMUNITY)
Admission: RE | Admit: 2013-01-21 | Discharge: 2013-01-21 | Disposition: A | Discharge status: Home / Self Care | Payer: Medicare Other | Source: Ambulatory Visit | Attending: Vascular Surgery | Admitting: Vascular Surgery

## 2013-01-21 ENCOUNTER — Ambulatory Visit (HOSPITAL_COMMUNITY): Payer: Medicare Other

## 2013-01-21 ENCOUNTER — Other Ambulatory Visit: Payer: Self-pay | Admitting: *Deleted

## 2013-01-21 ENCOUNTER — Encounter (HOSPITAL_COMMUNITY): Payer: Self-pay | Admitting: Certified Registered"

## 2013-01-21 DIAGNOSIS — I120 Hypertensive chronic kidney disease with stage 5 chronic kidney disease or end stage renal disease: Secondary | ICD-10-CM | POA: Insufficient documentation

## 2013-01-21 DIAGNOSIS — Z91041 Radiographic dye allergy status: Secondary | ICD-10-CM | POA: Insufficient documentation

## 2013-01-21 DIAGNOSIS — Z951 Presence of aortocoronary bypass graft: Secondary | ICD-10-CM | POA: Diagnosis not present

## 2013-01-21 DIAGNOSIS — L408 Other psoriasis: Secondary | ICD-10-CM | POA: Insufficient documentation

## 2013-01-21 DIAGNOSIS — I2510 Atherosclerotic heart disease of native coronary artery without angina pectoris: Secondary | ICD-10-CM | POA: Insufficient documentation

## 2013-01-21 DIAGNOSIS — E119 Type 2 diabetes mellitus without complications: Secondary | ICD-10-CM | POA: Diagnosis not present

## 2013-01-21 DIAGNOSIS — N186 End stage renal disease: Secondary | ICD-10-CM | POA: Diagnosis not present

## 2013-01-21 DIAGNOSIS — I10 Essential (primary) hypertension: Secondary | ICD-10-CM | POA: Diagnosis not present

## 2013-01-21 DIAGNOSIS — Z79899 Other long term (current) drug therapy: Secondary | ICD-10-CM | POA: Insufficient documentation

## 2013-01-21 DIAGNOSIS — I6529 Occlusion and stenosis of unspecified carotid artery: Secondary | ICD-10-CM | POA: Insufficient documentation

## 2013-01-21 DIAGNOSIS — H8109 Meniere's disease, unspecified ear: Secondary | ICD-10-CM | POA: Diagnosis not present

## 2013-01-21 DIAGNOSIS — Z9109 Other allergy status, other than to drugs and biological substances: Secondary | ICD-10-CM | POA: Insufficient documentation

## 2013-01-21 DIAGNOSIS — E039 Hypothyroidism, unspecified: Secondary | ICD-10-CM | POA: Insufficient documentation

## 2013-01-21 DIAGNOSIS — I739 Peripheral vascular disease, unspecified: Secondary | ICD-10-CM | POA: Insufficient documentation

## 2013-01-21 DIAGNOSIS — I509 Heart failure, unspecified: Secondary | ICD-10-CM | POA: Insufficient documentation

## 2013-01-21 DIAGNOSIS — M1990 Unspecified osteoarthritis, unspecified site: Secondary | ICD-10-CM | POA: Diagnosis not present

## 2013-01-21 DIAGNOSIS — Z794 Long term (current) use of insulin: Secondary | ICD-10-CM | POA: Insufficient documentation

## 2013-01-21 DIAGNOSIS — Z7982 Long term (current) use of aspirin: Secondary | ICD-10-CM | POA: Diagnosis not present

## 2013-01-21 DIAGNOSIS — R0601 Orthopnea: Secondary | ICD-10-CM | POA: Insufficient documentation

## 2013-01-21 DIAGNOSIS — E785 Hyperlipidemia, unspecified: Secondary | ICD-10-CM | POA: Diagnosis not present

## 2013-01-21 DIAGNOSIS — Z4931 Encounter for adequacy testing for hemodialysis: Secondary | ICD-10-CM

## 2013-01-21 DIAGNOSIS — M109 Gout, unspecified: Secondary | ICD-10-CM | POA: Diagnosis not present

## 2013-01-21 DIAGNOSIS — Z8249 Family history of ischemic heart disease and other diseases of the circulatory system: Secondary | ICD-10-CM | POA: Insufficient documentation

## 2013-01-21 DIAGNOSIS — Z01818 Encounter for other preprocedural examination: Secondary | ICD-10-CM | POA: Diagnosis not present

## 2013-01-21 DIAGNOSIS — N184 Chronic kidney disease, stage 4 (severe): Secondary | ICD-10-CM | POA: Diagnosis not present

## 2013-01-21 DIAGNOSIS — I658 Occlusion and stenosis of other precerebral arteries: Secondary | ICD-10-CM | POA: Insufficient documentation

## 2013-01-21 HISTORY — PX: AV FISTULA PLACEMENT: SHX1204

## 2013-01-21 HISTORY — DX: Gastro-esophageal reflux disease without esophagitis: K21.9

## 2013-01-21 LAB — POCT I-STAT 4, (NA,K, GLUC, HGB,HCT)
Glucose, Bld: 213 mg/dL — ABNORMAL HIGH (ref 70–99)
HCT: 39 % (ref 39.0–52.0)
Hemoglobin: 13.3 g/dL (ref 13.0–17.0)

## 2013-01-21 LAB — GLUCOSE, CAPILLARY: Glucose-Capillary: 214 mg/dL — ABNORMAL HIGH (ref 70–99)

## 2013-01-21 SURGERY — ARTERIOVENOUS (AV) FISTULA CREATION
Anesthesia: Monitor Anesthesia Care | Site: Arm Upper | Laterality: Left | Wound class: Clean

## 2013-01-21 MED ORDER — FENTANYL CITRATE 0.05 MG/ML IJ SOLN
INTRAMUSCULAR | Status: DC | PRN
  Administered 2013-01-21: 50 ug via INTRAVENOUS

## 2013-01-21 MED ORDER — LIDOCAINE HCL (PF) 1 % IJ SOLN
INTRAMUSCULAR | Status: AC
  Filled 2013-01-21: qty 30

## 2013-01-21 MED ORDER — THROMBIN 20000 UNITS EX SOLR
CUTANEOUS | Status: AC
  Filled 2013-01-21: qty 20000

## 2013-01-21 MED ORDER — HEPARIN SODIUM (PORCINE) 1000 UNIT/ML IJ SOLN
INTRAMUSCULAR | Status: DC | PRN
  Administered 2013-01-21: 7000 [IU] via INTRAVENOUS

## 2013-01-21 MED ORDER — OXYCODONE HCL 5 MG/5ML PO SOLN: 5.0000 mg | Freq: Once | ORAL | Status: AC | PRN

## 2013-01-21 MED ORDER — MIDAZOLAM HCL 5 MG/5ML IJ SOLN
INTRAMUSCULAR | Status: DC | PRN
  Administered 2013-01-21: 2 mg via INTRAVENOUS

## 2013-01-21 MED ORDER — OXYCODONE HCL 5 MG PO TABS
ORAL_TABLET | ORAL | Status: AC
  Filled 2013-01-21: qty 1

## 2013-01-21 MED ORDER — 0.9 % SODIUM CHLORIDE (POUR BTL) OPTIME
TOPICAL | Status: DC | PRN
  Administered 2013-01-21: 1000 mL

## 2013-01-21 MED ORDER — SODIUM CHLORIDE 0.9 % IV SOLN
INTRAVENOUS | Status: DC
  Administered 2013-01-21: 20 mL/h via INTRAVENOUS

## 2013-01-21 MED ORDER — OXYCODONE HCL 5 MG PO TABS
5.0000 mg | ORAL_TABLET | Freq: Once | ORAL | Status: AC | PRN
  Administered 2013-01-21: 5 mg via ORAL

## 2013-01-21 MED ORDER — LIDOCAINE-EPINEPHRINE (PF) 1 %-1:200000 IJ SOLN
INTRAMUSCULAR | Status: AC
  Filled 2013-01-21: qty 10

## 2013-01-21 MED ORDER — ONDANSETRON HCL 4 MG/2ML IJ SOLN
INTRAMUSCULAR | Status: DC | PRN
  Administered 2013-01-21: 4 mg via INTRAVENOUS

## 2013-01-21 MED ORDER — OXYCODONE-ACETAMINOPHEN 5-325 MG PO TABS: 1.0000 | ORAL_TABLET | ORAL | Status: DC | PRN

## 2013-01-21 MED ORDER — LIDOCAINE HCL (PF) 1 % IJ SOLN
INTRAMUSCULAR | Status: DC | PRN
  Administered 2013-01-21: 30 mL

## 2013-01-21 MED ORDER — PROPOFOL INFUSION 10 MG/ML OPTIME
INTRAVENOUS | Status: DC | PRN
  Administered 2013-01-21: 75 ug/kg/min via INTRAVENOUS

## 2013-01-21 MED ORDER — SODIUM CHLORIDE 0.9 % IR SOLN
Status: DC | PRN
  Administered 2013-01-21: 11:00:00

## 2013-01-21 MED ORDER — EPHEDRINE SULFATE 50 MG/ML IJ SOLN
INTRAMUSCULAR | Status: DC | PRN
  Administered 2013-01-21 (×2): 5 mg via INTRAVENOUS
  Administered 2013-01-21: 10 mg via INTRAVENOUS

## 2013-01-21 MED ORDER — HYDROMORPHONE HCL PF 1 MG/ML IJ SOLN: 0.2500 mg | INTRAMUSCULAR | Status: DC | PRN

## 2013-01-21 MED ORDER — PROMETHAZINE HCL 25 MG/ML IJ SOLN: 6.2500 mg | INTRAMUSCULAR | Status: DC | PRN

## 2013-01-21 MED ORDER — PROTAMINE SULFATE 10 MG/ML IV SOLN
INTRAVENOUS | Status: DC | PRN
  Administered 2013-01-21: 20 mg via INTRAVENOUS
  Administered 2013-01-21: 10 mg via INTRAVENOUS

## 2013-01-21 SURGICAL SUPPLY — 42 items
ADH SKN CLS APL DERMABOND .7 (GAUZE/BANDAGES/DRESSINGS) ×1
ARMBAND PINK RESTRICT EXTREMIT (MISCELLANEOUS) ×4 IMPLANT
CANISTER SUCTION 2500CC (MISCELLANEOUS) ×2 IMPLANT
CLIP TI MEDIUM 6 (CLIP) ×2 IMPLANT
CLIP TI WIDE RED SMALL 6 (CLIP) ×3 IMPLANT
CLOTH BEACON ORANGE TIMEOUT ST (SAFETY) ×2 IMPLANT
COVER PROBE W GEL 5X96 (DRAPES) IMPLANT
COVER SURGICAL LIGHT HANDLE (MISCELLANEOUS) ×2 IMPLANT
DECANTER SPIKE VIAL GLASS SM (MISCELLANEOUS) ×2 IMPLANT
DERMABOND ADVANCED (GAUZE/BANDAGES/DRESSINGS) ×1
DERMABOND ADVANCED .7 DNX12 (GAUZE/BANDAGES/DRESSINGS) ×1 IMPLANT
DRAIN PENROSE 1/2X12 LTX STRL (WOUND CARE) IMPLANT
ELECT REM PT RETURN 9FT ADLT (ELECTROSURGICAL) ×2
ELECTRODE REM PT RTRN 9FT ADLT (ELECTROSURGICAL) ×1 IMPLANT
GEL ULTRASOUND 20GR AQUASONIC (MISCELLANEOUS) ×1 IMPLANT
GLOVE BIO SURGEON STRL SZ7.5 (GLOVE) ×3 IMPLANT
GLOVE BIOGEL PI IND STRL 6.5 (GLOVE) IMPLANT
GLOVE BIOGEL PI IND STRL 7.0 (GLOVE) IMPLANT
GLOVE BIOGEL PI IND STRL 8 (GLOVE) ×1 IMPLANT
GLOVE BIOGEL PI INDICATOR 6.5 (GLOVE) ×1
GLOVE BIOGEL PI INDICATOR 7.0 (GLOVE) ×2
GLOVE BIOGEL PI INDICATOR 8 (GLOVE) ×2
GLOVE ECLIPSE 6.0 STRL STRAW (GLOVE) ×1 IMPLANT
GLOVE ECLIPSE 6.5 STRL STRAW (GLOVE) ×1 IMPLANT
GOWN STRL NON-REIN LRG LVL3 (GOWN DISPOSABLE) ×3 IMPLANT
KIT BASIN OR (CUSTOM PROCEDURE TRAY) ×2 IMPLANT
KIT ROOM TURNOVER OR (KITS) ×2 IMPLANT
NDL HYPO 25GX1X1/2 BEV (NEEDLE) ×1 IMPLANT
NEEDLE HYPO 25GX1X1/2 BEV (NEEDLE) ×2 IMPLANT
NS IRRIG 1000ML POUR BTL (IV SOLUTION) ×2 IMPLANT
PACK CV ACCESS (CUSTOM PROCEDURE TRAY) ×2 IMPLANT
PAD ARMBOARD 7.5X6 YLW CONV (MISCELLANEOUS) ×4 IMPLANT
SPONGE GAUZE 4X4 12PLY (GAUZE/BANDAGES/DRESSINGS) ×2 IMPLANT
SPONGE SURGIFOAM ABS GEL 100 (HEMOSTASIS) IMPLANT
SUT PROLENE 6 0 BV (SUTURE) ×2 IMPLANT
SUT VIC AB 3-0 SH 27 (SUTURE) ×2
SUT VIC AB 3-0 SH 27X BRD (SUTURE) ×1 IMPLANT
SUT VICRYL 4-0 PS2 18IN ABS (SUTURE) ×2 IMPLANT
TOWEL OR 17X24 6PK STRL BLUE (TOWEL DISPOSABLE) ×2 IMPLANT
TOWEL OR 17X26 10 PK STRL BLUE (TOWEL DISPOSABLE) ×2 IMPLANT
UNDERPAD 30X30 INCONTINENT (UNDERPADS AND DIAPERS) ×2 IMPLANT
WATER STERILE IRR 1000ML POUR (IV SOLUTION) ×2 IMPLANT

## 2013-01-21 NOTE — Anesthesia Procedure Notes (Signed)
Procedure Name: MAC Date/Time: 01/21/2013 11:20 AM Performed by: Jerilee Hoh Pre-anesthesia Checklist: Patient identified, Emergency Drugs available, Suction available and Patient being monitored Patient Re-evaluated:Patient Re-evaluated prior to inductionOxygen Delivery Method: Simple face mask Intubation Type: IV induction Placement Confirmation: positive ETCO2 and breath sounds checked- equal and bilateral

## 2013-01-21 NOTE — Transfer of Care (Signed)
Immediate Anesthesia Transfer of Care Note  Patient: [name]  Procedure(s) Performed: Procedure(s): ARTERIOVENOUS (AV) FISTULA CREATION, Left Brachiocephalic (Left)  Patient Location: PACU  Anesthesia Type:MAC  Level of Consciousness: sedated and responds to stimulation  Airway & Oxygen Therapy: Patient Spontanous Breathing and Patient connected to face mask oxygen  Post-op Assessment: Report given to PACU RN, Post -op Vital signs reviewed and stable and Patient moving all extremities  Post vital signs: Reviewed and stable  Complications: No apparent anesthesia complications

## 2013-01-21 NOTE — Anesthesia Preprocedure Evaluation (Addendum)
Anesthesia Evaluation  Patient identified by MRN, date of birth, ID band Patient awake    Reviewed: Allergy & Precautions, H&P   Airway   Neck ROM: Full    Dental   Pulmonary shortness of breath,  breath sounds clear to auscultation        Cardiovascular hypertension, + CAD, + Peripheral Vascular Disease, +CHF and + Orthopnea Rate:Normal     Neuro/Psych    GI/Hepatic GERD-  ,  Endo/Other  diabetesHypothyroidism   Renal/GU ESRFRenal disease     Musculoskeletal   Abdominal   Peds  Hematology   Anesthesia Other Findings   Reproductive/Obstetrics                           Anesthesia Physical Anesthesia Plan  ASA: III  Anesthesia Plan: MAC   Post-op Pain Management:    Induction: Intravenous  Airway Management Planned: Natural Airway and Simple Face Mask  Additional Equipment:   Intra-op Plan:   Post-operative Plan:   Informed Consent: I have reviewed the patients History and Physical, chart, labs and discussed the procedure including the risks, benefits and alternatives for the proposed anesthesia with the patient or authorized representative who has indicated his/her understanding and acceptance.     Plan Discussed with: CRNA and Surgeon  Anesthesia Plan Comments:        Anesthesia Quick Evaluation

## 2013-01-21 NOTE — Anesthesia Postprocedure Evaluation (Signed)
  Anesthesia Post-op Note  Patient: [name]  Procedure(s) Performed: Procedure(s): ARTERIOVENOUS (AV) FISTULA CREATION, Left Brachiocephalic (Left)  Patient Location: PACU  Anesthesia Type:MAC  Level of Consciousness: awake  Airway and Oxygen Therapy: Patient Spontanous Breathing  Post-op Pain: none  Post-op Assessment: Post-op Vital signs reviewed  Post-op Vital Signs: stable  Complications: No apparent anesthesia complications

## 2013-01-21 NOTE — Interval H&P Note (Signed)
History and Physical Interval Note:  01/21/2013 10:42 AM  [name]  has presented today for surgery, with the diagnosis of ESRD  The various methods of treatment have been discussed with the patient and family. After consideration of risks, benefits and other options for treatment, the patient has consented to  Procedure(s): ARTERIOVENOUS (AV) FISTULA CREATION (Left) as a surgical intervention .  The patient's history has been reviewed, patient examined, no change in status, stable for surgery.  I have reviewed the patient's chart and labs.  Questions were answered to the patient's satisfaction.     Helena Sardo S

## 2013-01-21 NOTE — H&P (View-Only) (Signed)
Vascular and Vein Specialist of   Patient name: [name] MRN: [id number] DOB: 12/22/1933 Sex: male  REASON FOR VISIT: evaluate for hemodialysis access. Referred by Dr. Cynthia Dunham  HPI: [name] is a 79 y.o. male who is not yet on dialysis. He is right-handed. He was sent for evaluation for hemodialysis access. I have reviewed his records from Dr. Dunham is office. He has stage IV chronic kidney disease. This is likely secondary to diabetes. In addition he has hypertension which is been under good control.  He denies any uremic symptoms. Specifically he denies nausea, vomiting, anorexia, or palpitations. He has had some fatigue.  Past Medical History  Diagnosis Date  . Hypertension   . Hyperlipidemia   . Thyroid disease   . CAD (coronary artery disease)     with prior CABG in 1997 with redo in 2000; last cath in 2008 - managed medically  . Type 2 diabetes mellitus      with fair control  . Hypothyroidism   . Generalized weakness     without overt findings  . Dehydration     Mild dehydration, resolved  . Peripheral vascular disease   . Carotid disease, bilateral     with multiple bilateral carotid surgeries  . Psoriasis   . Meniere's disease   . Degenerative joint disease   . Gout   . Orthopnea     Two-pillow  . CRI (chronic renal insufficiency)   . CHF (congestive heart failure)    Family History  Problem Relation Age of Onset  . Heart attack Father   . Heart disease Father   . Hyperlipidemia Father   . Hypertension Father   . Diabetes Mother   . Hypertension Mother   . Heart disease Brother   . Diabetes Brother    SOCIAL HISTORY: History  Substance Use Topics  . Smoking status: Never Smoker   . Smokeless tobacco: Not on file  . Alcohol Use: No   Allergies  Allergen Reactions  . Betadine (Povidone Iodine)   . Iodinated Diagnostic Agents Rash    Does fine with premedications for cath   Current Outpatient Prescriptions  Medication  Sig Dispense Refill  . allopurinol (ZYLOPRIM) 300 MG tablet Take 1 tablet (300 mg total) by mouth daily.  30 tablet  11  . amLODipine (NORVASC) 10 MG tablet Take 1 tablet (10 mg total) by mouth daily.  30 tablet  5  . aspirin 325 MG tablet Take 1 tablet (325 mg total) by mouth daily.  30 tablet  11  . atorvastatin (LIPITOR) 40 MG tablet Take 40 mg by mouth daily.        . cholecalciferol (VITAMIN D) 1000 UNITS tablet Take 1,000 Units by mouth daily.      . cloNIDine (CATAPRES) 0.2 MG tablet Take 0.2 mg by mouth daily.   60 tablet  11  . clopidogrel (PLAVIX) 75 MG tablet Take 1 tablet (75 mg total) by mouth daily.  30 tablet  11  . fenofibrate 160 MG tablet Take 1 tablet (160 mg total) by mouth daily.  30 tablet  5  . insulin aspart (NOVOLOG) 100 UNIT/ML injection Inject 40 Units into the skin daily. Use as directed  10 mL  12  . insulin glargine (LANTUS) 100 UNIT/ML injection 20 units in the morning, 20 units in the evening  10 mL  12  . isosorbide mononitrate (IMDUR) 60 MG 24 hr tablet TAKE ONE TABLET BY MOUTH EVERY DAY  90   tablet  1  . levothyroxine (SYNTHROID) 150 MCG tablet Take 1 tablet (150 mcg total) by mouth daily.  30 tablet  11  . Multiple Vitamins-Minerals (MULTIVITAL PLATINUM) TABS One po daily   30 each  0  . nitroGLYCERIN (NITROSTAT) 0.4 MG SL tablet Place 0.4 mg under the tongue every 5 (five) minutes as needed.      . ranitidine (ZANTAC) 300 MG tablet Take 300 mg by mouth 2 (two) times daily.   30 tablet  11   No current facility-administered medications for this visit.   REVIEW OF SYSTEMS: [X ] denotes positive finding; [  ] denotes negative finding  CARDIOVASCULAR:  [ ] chest pain   [ ] chest pressure   [ ] palpitations   [ ] orthopnea   [ ] dyspnea on exertion   [ ] claudication   [ ] rest pain   [ ] DVT   [ ] phlebitis PULMONARY:   [ ] productive cough   [ ] asthma   [ ] wheezing NEUROLOGIC:   [ ] weakness  [ ] paresthesias  [ ] aphasia  [ ] amaurosis  [ ]  dizziness HEMATOLOGIC:   [ ] bleeding problems   [ ] clotting disorders MUSCULOSKELETAL:  [ ] joint pain   [ ] joint swelling [ ] leg swelling GASTROINTESTINAL: [ ]  blood in stool  [ ]  hematemesis GENITOURINARY:  [ ]  dysuria  [ ]  hematuria PSYCHIATRIC:  [ ] history of major depression INTEGUMENTARY:  [ ] rashes  [ ] ulcers CONSTITUTIONAL:  [ ] fever   [ ] chills  PHYSICAL EXAM: Filed Vitals:   01/15/13 0936  BP: 132/71  Pulse: 59  Height: 5' 10" (1.778 m)  Weight: 208 lb 1.6 oz (94.394 kg)  SpO2: 98%   Body mass index is 29.86 kg/(m^2). GENERAL: The patient is a well-nourished male, in no acute distress. The vital signs are documented above. CARDIOVASCULAR: There is a regular rate and rhythm. He has a right carotid bruit. He has a systolic ejection murmur. He has palpable radial pulses bilaterally. PULMONARY: There is good air exchange bilaterally without wheezing or rales. ABDOMEN: Soft and non-tender with normal pitched bowel sounds.  MUSCULOSKELETAL: There are no major deformities or cyanosis. NEUROLOGIC: No focal weakness or paresthesias are detected. SKIN: There are no ulcers or rashes noted. PSYCHIATRIC: The patient has a normal affect.  DATA:  I have independently interpreted his vein mapping today. He appears to have a usable cephalic and basilic veins bilaterally.  MEDICAL ISSUES:  End stage renal disease This patient appears to be a good candidate for an AV fistula in the left arm. I have explained the indications for placement of an AV fistula or AV graft. I've explained that if at all possible we will place an AV fistula.  I have reviewed the risks of placement of an AV fistula including but not limited to: failure of the fistula to mature, need for subsequent interventions, and thrombosis. All the patient's questions were answered and they are agreeable to proceed with surgery. The surgery is scheduled for 01/21/2013.   In addition, this patient has a history of  carotid disease and is being followed with a recurrent right carotid stenosis which is in the 60-79% range. His last study was in September 2013. I offered to have him have a study done today and he would prefer to wait until September so we will schedule this. In the meantime   he is continue taking his Plavix. He has been asymptomatic from this standpoint   DICKSON,CHRISTOPHER S [facility] [place] Beeper: [phone]    

## 2013-01-21 NOTE — Preoperative (Signed)
Beta Blockers   Reason not to administer Beta Blockers:Not Applicable 

## 2013-01-21 NOTE — Op Note (Signed)
NAME: [name]   MRN: [id number] DOB: 23-May-1934    DATE OF OPERATION: 01/21/2013  PREOP DIAGNOSIS: Stage IV chronic kidney disease   POSTOP DIAGNOSIS: Same  PROCEDURE: Left brachial cephalic AV fistula  SURGEON: Di Kindle. Edilia Bo, MD, FACS  ASSIST: Trudie Buckler, RNFA  ANESTHESIA: local with sedation   EBL: minimal  INDICATIONS: [name] is a 79 y.o. male who is not yet on dialysis. He presents for elective placement of a fistula.  FINDINGS: 4.5 mm cephalic vein.  TECHNIQUE: The patient was brought to the operating room and sedated by anesthesia. The left upper extremity was prepped and draped in usual sterile fashion. After the skin was infiltrated with 1% lidocaine, a transverse incision was made just above the antecubital level. Here the cephalic vein was dissected free. Branches were divided between clips and 3-0 silk ties. The vein was ligated distally. It irrigated up nicely with heparinized saline. The brachial artery was dissected free beneath the fascia. The patient was heparinized. The brachial artery was clamped proximally and distally and a longitudinal arteriotomy was made. The vein was mobilized over and sewn end to side to the artery using continuous 6-0 Prolene suture. At the completion was an excellent thrill in the fistula. There was a palpable radial pulse. The heparin was partially reversed with protamine. The wound was closed with a deep layer of 3-0 Vicryl and the skin closed with 4-0 Vicryl. Dermabond was applied. The patient tolerated the procedure well and was transferred to the recovery room in stable condition. All needle and sponge counts were correct.  Waverly Ferrari, MD, FACS [facility]  DATE OF DICTATION:   01/21/2013

## 2013-01-22 ENCOUNTER — Telehealth: Payer: Self-pay | Admitting: Vascular Surgery

## 2013-01-22 NOTE — Telephone Encounter (Addendum)
Message copied by Fredrich Birks on Wed Jan 22, 2013  9:55 AM ------      Message from: Sharee Pimple      Created: Tue Jan 21, 2013  2:13 PM      Regarding: schedule                   ----- Message -----         From: Melene Plan, RN         Sent: 01/21/2013   1:28 PM           To: Sharee Pimple, CMA, [facility]      Subject: FW: charge and f/u                                                   ----- Message -----         From: Chuck Hint, MD         Sent: 01/21/2013  12:47 PM           To: Reuel Derby, Melene Plan, RN, #      Subject: charge and f/u                                           PROCEDURE: Left brachial cephalic AV fistula            SURGEON: Di Kindle. Edilia Bo, MD, FACS            ASSIST: Trudie Buckler, RNFA            He will need a follow up visit in 6 weeks with a duplex to check on the maturation of his AV fistula. Thank you. CD ------  Spoke with pts wife to inform of appt information, dpm

## 2013-01-24 ENCOUNTER — Encounter (HOSPITAL_COMMUNITY): Payer: Self-pay | Admitting: Vascular Surgery

## 2013-01-24 ENCOUNTER — Telehealth: Payer: Self-pay

## 2013-01-24 NOTE — Telephone Encounter (Signed)
Wife called to request pt. be given a refill on his pain medication.  Wife stated that most of pt's pain is in his abscessed tooth, and he is awaiting clearance to get it pulled.  Denies that pt. has much discomfort in the left arm surgical site.  Stated the pt. got the pain medication from Dr. Edilia Bo filled,and has been using it for the pain from abscessed tooth, instead of the pain medication that was ordered by his dentist.  Reports has approx. 6 pills left of the Oxycodone, and has an Rx, from his dentist, for Norco 5/325 mg; # 16.  Advised that pt. should use the prescription from his dentist for his toothache, and to get it filled when he runs out of the Oxycodone.  Wife reported that the left arm bruising has worsened and questions if this is concerning.  Noted pt. is on ASA and Plavix.  Advised wife that the bruising will be more prominent, due to being on the blood thinning medications.  Adv. to monitor the surgical site, and report if the area of bruising becomes increasingly tender, shows increased swelling, or becomes firm.  Wife verb. Understanding.

## 2013-02-06 DIAGNOSIS — Z6831 Body mass index (BMI) 31.0-31.9, adult: Secondary | ICD-10-CM | POA: Diagnosis not present

## 2013-02-06 DIAGNOSIS — I10 Essential (primary) hypertension: Secondary | ICD-10-CM | POA: Diagnosis not present

## 2013-02-06 DIAGNOSIS — E1159 Type 2 diabetes mellitus with other circulatory complications: Secondary | ICD-10-CM | POA: Diagnosis not present

## 2013-02-06 DIAGNOSIS — E785 Hyperlipidemia, unspecified: Secondary | ICD-10-CM | POA: Diagnosis not present

## 2013-02-06 DIAGNOSIS — I2510 Atherosclerotic heart disease of native coronary artery without angina pectoris: Secondary | ICD-10-CM | POA: Diagnosis not present

## 2013-02-06 DIAGNOSIS — I739 Peripheral vascular disease, unspecified: Secondary | ICD-10-CM | POA: Diagnosis not present

## 2013-03-11 ENCOUNTER — Encounter: Payer: Self-pay | Admitting: Vascular Surgery

## 2013-03-12 ENCOUNTER — Ambulatory Visit: Payer: Medicare Other | Admitting: Neurosurgery

## 2013-03-12 ENCOUNTER — Ambulatory Visit (INDEPENDENT_AMBULATORY_CARE_PROVIDER_SITE_OTHER): Payer: Self-pay | Admitting: Vascular Surgery

## 2013-03-12 ENCOUNTER — Encounter (INDEPENDENT_AMBULATORY_CARE_PROVIDER_SITE_OTHER): Payer: Medicare Other | Admitting: Vascular Surgery

## 2013-03-12 ENCOUNTER — Encounter: Payer: Self-pay | Admitting: Vascular Surgery

## 2013-03-12 ENCOUNTER — Other Ambulatory Visit (INDEPENDENT_AMBULATORY_CARE_PROVIDER_SITE_OTHER): Payer: Medicare Other | Admitting: Vascular Surgery

## 2013-03-12 VITALS — BP 161/63 | HR 62 | Resp 18 | Ht 70.5 in | Wt 204.0 lb

## 2013-03-12 DIAGNOSIS — Z48812 Encounter for surgical aftercare following surgery on the circulatory system: Secondary | ICD-10-CM

## 2013-03-12 DIAGNOSIS — I6529 Occlusion and stenosis of unspecified carotid artery: Secondary | ICD-10-CM | POA: Diagnosis not present

## 2013-03-12 DIAGNOSIS — Z4931 Encounter for adequacy testing for hemodialysis: Secondary | ICD-10-CM

## 2013-03-12 DIAGNOSIS — N186 End stage renal disease: Secondary | ICD-10-CM | POA: Diagnosis not present

## 2013-03-12 NOTE — Progress Notes (Signed)
Patient name: [name] MRN: [id number] DOB: 1933-11-21 Sex: male  REASON FOR VISIT: follow up after placement of a left brachiocephalic AV fistula. Also follow up carotid duplex scan.  HPI: [name] is a 79 y.o. male who underwent a left brachiocephalic AV fistula on 01/21/2013. He comes in for a follow up visit. He has no complaints referrable to the left arm with no pain or paresthesias.  He was also due for a yearly carotid duplex scan. He denies any history of TIAs, expressive or receptive aphasia, or amaurosis fugax.  REVIEW OF SYSTEMS: Arly.Keller ] denotes positive finding; [  ] denotes negative finding  CARDIOVASCULAR:  [ ]  chest pain   [ ]  dyspnea on exertion    CONSTITUTIONAL:  [ ]  fever   [ ]  chills  PHYSICAL EXAM: Filed Vitals:   03/12/13 1435 03/12/13 1437  BP: 177/78 161/63  Pulse: 66 62  Resp: 18   Height: 5' 10.5" (1.791 m)   Weight: 204 lb (92.534 kg)    Body mass index is 28.85 kg/(m^2). GENERAL: The patient is a well-nourished male, in no acute distress. The vital signs are documented above. CARDIOVASCULAR: There is a regular rate and rhythm  PULMONARY: There is good air exchange bilaterally without wheezing or rales. This fistula has an excellent thrill and appears to be maturing adequately.  Duplex of his fistula shows a diameters range from 0.43  To 0.77 cm. Depths are reasonable.  I have independently interpreted his carotid duplex scan which shows a 40-59% recurrent right internal carotid artery stenosis with no significant stenosis on the left.  MEDICAL ISSUES: With respect to his fistula in the left arm this appears to be maturing adequately. He does have some increased velocities at the proximal anastomosis but by exam this does not appear to be compromising inflow to the fistula.  With respect to his moderate recurrent right carotid stenosis I have ordered a follow carotid duplex scan in 1 year now see him back at that time. He knows to call sooner if  he has problems. In the meantime he knows to continue taking his aspirin.  DICKSON,CHRISTOPHER S [facility] of [place] Beeper: [phone]

## 2013-03-13 NOTE — Addendum Note (Signed)
Addended by: Adria Dill L on: 03/13/2013 12:24 PM   Modules accepted: Orders

## 2013-03-18 ENCOUNTER — Other Ambulatory Visit: Payer: Self-pay

## 2013-03-18 MED ORDER — FENOFIBRATE 160 MG PO TABS: 160.0000 mg | ORAL_TABLET | Freq: Every day | ORAL | Status: DC

## 2013-03-20 DIAGNOSIS — I10 Essential (primary) hypertension: Secondary | ICD-10-CM | POA: Diagnosis not present

## 2013-03-20 DIAGNOSIS — Z23 Encounter for immunization: Secondary | ICD-10-CM | POA: Diagnosis not present

## 2013-03-24 DIAGNOSIS — Z8349 Family history of other endocrine, nutritional and metabolic diseases: Secondary | ICD-10-CM | POA: Diagnosis not present

## 2013-03-24 DIAGNOSIS — N184 Chronic kidney disease, stage 4 (severe): Secondary | ICD-10-CM | POA: Diagnosis not present

## 2013-03-24 DIAGNOSIS — D649 Anemia, unspecified: Secondary | ICD-10-CM | POA: Diagnosis not present

## 2013-03-24 DIAGNOSIS — N2581 Secondary hyperparathyroidism of renal origin: Secondary | ICD-10-CM | POA: Diagnosis not present

## 2013-03-24 DIAGNOSIS — I129 Hypertensive chronic kidney disease with stage 1 through stage 4 chronic kidney disease, or unspecified chronic kidney disease: Secondary | ICD-10-CM | POA: Diagnosis not present

## 2013-03-24 DIAGNOSIS — Z130 Encounter for screening for diseases of the blood and blood-forming organs and certain disorders involving the immune mechanism: Secondary | ICD-10-CM | POA: Diagnosis not present

## 2013-03-28 ENCOUNTER — Telehealth: Payer: Self-pay | Admitting: Cardiovascular Disease

## 2013-03-28 ENCOUNTER — Ambulatory Visit (INDEPENDENT_AMBULATORY_CARE_PROVIDER_SITE_OTHER)
Admission: RE | Admit: 2013-03-28 | Discharge: 2013-03-28 | Disposition: A | Discharge status: Home / Self Care | Payer: Medicare Other | Source: Ambulatory Visit | Attending: Cardiovascular Disease | Admitting: Cardiovascular Disease

## 2013-03-28 DIAGNOSIS — R0602 Shortness of breath: Secondary | ICD-10-CM | POA: Diagnosis not present

## 2013-03-28 DIAGNOSIS — J984 Other disorders of lung: Secondary | ICD-10-CM | POA: Diagnosis not present

## 2013-03-28 NOTE — Telephone Encounter (Signed)
Spoke to patient's wife Dr.Nahser advised husband needs a cxr two views,echo,office visit in the next week or so.Advised he can go get cxr today 03/28/13 at [facility] on [address].Schedulers will call back to schedule echo and office visit with Dr.Nahser.

## 2013-03-28 NOTE — Telephone Encounter (Signed)
Received a phone call from Dr. Sharen Heck.  Mistrusts continues to have severe shortness breath. He's draining continues to go up. It is 2.88 daily.  Dr. Eliott Nine  was unable to really find any evidence of congestive heart failure but is worried that he may have worsening coronary artery disease. We have documented inferior lateral ischemia on Myoview scan but did not want to do a heart cath position because of his worsening renal function. At this point Dr. Eliott Nine thinks that he may need to have dialysis at some point anyway. We may need to proceed with cardiac catheterization.  We'll get an echocardiogram and also a two-view chest x-ray to look for any signs of congestive heart failure. We'll see him in the office for a visit in the next week or so.

## 2013-04-11 ENCOUNTER — Ambulatory Visit (HOSPITAL_COMMUNITY): Payer: Medicare Other | Attending: Cardiovascular Disease | Admitting: Cardiology

## 2013-04-11 DIAGNOSIS — I519 Heart disease, unspecified: Secondary | ICD-10-CM | POA: Insufficient documentation

## 2013-04-11 DIAGNOSIS — I2510 Atherosclerotic heart disease of native coronary artery without angina pectoris: Secondary | ICD-10-CM | POA: Insufficient documentation

## 2013-04-11 DIAGNOSIS — E119 Type 2 diabetes mellitus without complications: Secondary | ICD-10-CM | POA: Insufficient documentation

## 2013-04-11 DIAGNOSIS — R0989 Other specified symptoms and signs involving the circulatory and respiratory systems: Secondary | ICD-10-CM | POA: Insufficient documentation

## 2013-04-11 DIAGNOSIS — R0609 Other forms of dyspnea: Secondary | ICD-10-CM | POA: Insufficient documentation

## 2013-04-11 DIAGNOSIS — I10 Essential (primary) hypertension: Secondary | ICD-10-CM | POA: Diagnosis not present

## 2013-04-11 DIAGNOSIS — E785 Hyperlipidemia, unspecified: Secondary | ICD-10-CM | POA: Diagnosis not present

## 2013-04-11 DIAGNOSIS — R0602 Shortness of breath: Secondary | ICD-10-CM | POA: Diagnosis not present

## 2013-04-11 NOTE — Progress Notes (Signed)
Echo performed. 

## 2013-04-18 ENCOUNTER — Telehealth: Payer: Self-pay | Admitting: *Deleted

## 2013-04-18 DIAGNOSIS — I2510 Atherosclerotic heart disease of native coronary artery without angina pectoris: Secondary | ICD-10-CM

## 2013-04-18 DIAGNOSIS — E785 Hyperlipidemia, unspecified: Secondary | ICD-10-CM

## 2013-04-18 NOTE — Telephone Encounter (Signed)
Further discussed echo results/ mailed copy/ confirmed f/u app.

## 2013-05-03 ENCOUNTER — Other Ambulatory Visit: Payer: Self-pay | Admitting: Cardiovascular Disease

## 2013-05-06 ENCOUNTER — Other Ambulatory Visit (INDEPENDENT_AMBULATORY_CARE_PROVIDER_SITE_OTHER): Payer: Medicare Other

## 2013-05-06 DIAGNOSIS — E785 Hyperlipidemia, unspecified: Secondary | ICD-10-CM | POA: Diagnosis not present

## 2013-05-06 DIAGNOSIS — I2510 Atherosclerotic heart disease of native coronary artery without angina pectoris: Secondary | ICD-10-CM

## 2013-05-06 LAB — LIPID PANEL
Cholesterol: 175 mg/dL (ref 0–200)
HDL: 36.7 mg/dL — ABNORMAL LOW (ref >39.00)
LDL Cholesterol: 111 mg/dL — ABNORMAL HIGH (ref 0–99)
Total CHOL/HDL Ratio: 5
Triglycerides: 136 mg/dL (ref 0.0–149.0)
VLDL: 27.2 mg/dL (ref 0.0–40.0)

## 2013-05-06 LAB — HEPATIC FUNCTION PANEL
ALT: 18 U/L (ref 0–53)
AST: 25 U/L (ref 0–37)
Albumin: 4.2 g/dL (ref 3.5–5.2)
Bilirubin, Direct: 0.1 mg/dL (ref 0.0–0.3)
Total Bilirubin: 0.6 mg/dL (ref 0.3–1.2)

## 2013-05-06 LAB — BASIC METABOLIC PANEL
BUN: 42 mg/dL — ABNORMAL HIGH (ref 6–23)
CO2: 30 mEq/L (ref 19–32)
Chloride: 102 mEq/L (ref 96–112)
Creatinine, Ser: 2.8 mg/dL — ABNORMAL HIGH (ref 0.4–1.5)
GFR: 23.45 mL/min — ABNORMAL LOW (ref >60.00)
Glucose, Bld: 101 mg/dL — ABNORMAL HIGH (ref 70–99)
Potassium: 4.4 mEq/L (ref 3.5–5.1)

## 2013-05-07 ENCOUNTER — Telehealth: Payer: Self-pay | Admitting: Nurse Practitioner

## 2013-05-07 MED ORDER — ATORVASTATIN CALCIUM 80 MG PO TABS: 80.0000 mg | ORAL_TABLET | Freq: Every day | ORAL | Status: DC

## 2013-05-07 NOTE — Telephone Encounter (Signed)
I reviewed results with patient's wife who verbalized understanding.  Wife states patient has a lot of his atorvastatin 40 mg tablets left and I advised her that patient can take 2 of the 40 mg tabs until he runs out.  New Rx for Atorvastatin 80 mg sent to patient's pharmacy

## 2013-05-07 NOTE — Telephone Encounter (Signed)
Message copied by Levi Aland on Wed May 07, 2013 11:55 AM ------      Message from: Vesta Mixer      Created: Tue May 06, 2013  3:44 PM       His cholesterol is elevated.  Increase lipitor to 80 a day       ------

## 2013-05-08 ENCOUNTER — Ambulatory Visit (INDEPENDENT_AMBULATORY_CARE_PROVIDER_SITE_OTHER): Payer: Medicare Other | Admitting: Cardiovascular Disease

## 2013-05-08 ENCOUNTER — Encounter: Payer: Self-pay | Admitting: Nephrology

## 2013-05-08 ENCOUNTER — Encounter: Payer: Self-pay | Admitting: Cardiovascular Disease

## 2013-05-08 VITALS — BP 188/78 | HR 67 | Ht 70.0 in | Wt 208.0 lb

## 2013-05-08 DIAGNOSIS — I2589 Other forms of chronic ischemic heart disease: Secondary | ICD-10-CM

## 2013-05-08 DIAGNOSIS — R0609 Other forms of dyspnea: Secondary | ICD-10-CM

## 2013-05-08 DIAGNOSIS — I10 Essential (primary) hypertension: Secondary | ICD-10-CM

## 2013-05-08 DIAGNOSIS — R0600 Dyspnea, unspecified: Secondary | ICD-10-CM

## 2013-05-08 DIAGNOSIS — E785 Hyperlipidemia, unspecified: Secondary | ICD-10-CM | POA: Diagnosis not present

## 2013-05-08 MED ORDER — FUROSEMIDE 80 MG PO TABS: 80.0000 mg | ORAL_TABLET | Freq: Every day | ORAL | Status: DC

## 2013-05-08 MED ORDER — CLONIDINE HCL 0.2 MG PO TABS: 0.2000 mg | ORAL_TABLET | Freq: Two times a day (BID) | ORAL | Status: DC

## 2013-05-08 NOTE — Assessment & Plan Note (Signed)
Will increase clonidine to 0.2 BID.  We are also adding lasix 80 a day. BMP in 3 weeks Ov and BMP in 3 months

## 2013-05-08 NOTE — Patient Instructions (Signed)
Your physician has recommended you make the following change in your medication:  Increase clonidine 0.2 mg twice daily 12 hrs apart Start lasix 80 mg daily/ in the morning  Your physician recommends that you return for lab work in: 3 weeks bmet  Your physician wants you to follow-up in: 3 months You will receive a reminder letter in the mail two months in advance. If you don't receive a letter, please call our office to schedule the follow-up appointment.

## 2013-05-08 NOTE — Assessment & Plan Note (Addendum)
He is not having any significnat angina but does have dyspnea.  I had a long discussion about cardiac cath - which may lead to complete renal failure and the need for dialysis.   He has known severe diffuse CAD - some of which is not amenable to PCI.    He has abnormalities on his last myoview study and I do not think that a repeat myoview would be of much benefit.  Will hold off doing a cath  as long as possible.

## 2013-05-08 NOTE — Assessment & Plan Note (Addendum)
[name] has well preserved LV systlic function - mild LVH. He has known CAD and has Diastolic dysfunction. He may benefit from lasix.will start Lasix 80 QD.  ckeck bmp in 3 weeks. i'll see him in 3 months.  O2 sat was 94% at rest - decreased slightly to 90% after exertion.

## 2013-05-08 NOTE — Progress Notes (Signed)
[name] Date of Birth  20-Oct-1933 [facility] [address]    [address] [place], [place]  [address] [phone]  Fax  [phone]  History of Present Illness:  [name] is a 79 year old gentleman with a history of coronary artery disease and coronary artery bypass grafting. He recently presented with some right-sided shoulder pain that radiates through to his chest. He had a stress Myoview study which revealed a small inferolateral defect. He was set up for cardiac catheterization but his precath labs revealed a creatinine of 2.4 which was new. His baseline creatinine is between 1.6 and 1.8. We discontinued his HCTZ and his ACE inhibitor.  He was seen by his medical doctor who also stop his metformin.  We were going to do a cardiac catheterization this winter but we canceled the case because of his renal insufficiency. His metformin was stopped and he made some other medical changes in his creatinine has now improved. Fortunately, he's not having any further episodes of angina.  He informed me that his insurance company is no longer paying for Ranexa.  He's been bradycardic for years. We do not have him on beta blocker for that reason.  He's had some shoulder problems. He needs to have surgery on his right shoulder soon. He will eventually need to have surgery on his left shoulder. He had a low risk  Myoview study in November, 2012. He had an echocardiogram in August, 2013 that was also unchanged.  He does not follow a strict diabetic diet.  His glucose levels went very high after his steroid injection ( > 500)  September 27, 2012:  Pt is doing well.  He denies any chest pain. Has some shoulder soreness.  He does have chronic dyspnea and has had worsening dyspnea since he had the flu in January.  He stays away from salt.  Nov. 13, 2014;  He contniues to have dyspnea an CP.  He did not take a NTG.  He typically gets CP with exertion.   He is short of breath at rest and  with exertion.  He seems to minimize his symptoms - wife thinks he has more shortness of breath that he admits to.    i was asked to see him again by Dr. Eliott Nine for worsening dyspnea.  He avoids  salt.   Echo showed LVEF of 65-70% with LVH, .mild aortic stenosis    Current Outpatient Prescriptions on File Prior to Visit  Medication Sig Dispense Refill  . acetaminophen (TYLENOL) 500 MG tablet Take 1,000 mg by mouth 2 (two) times daily as needed for pain.      Marland Kitchen allopurinol (ZYLOPRIM) 300 MG tablet Take 1 tablet (300 mg total) by mouth daily.  30 tablet  11  . amLODipine (NORVASC) 10 MG tablet Take 1 tablet (10 mg total) by mouth daily.  30 tablet  5  . aspirin 325 MG tablet Take 1 tablet (325 mg total) by mouth daily.  30 tablet  11  . atorvastatin (LIPITOR) 80 MG tablet Take 1 tablet (80 mg total) by mouth daily.  90 tablet  3  . cholecalciferol (VITAMIN D) 1000 UNITS tablet Take 1,000 Units by mouth daily.      . cloNIDine (CATAPRES) 0.2 MG tablet Take 0.2 mg by mouth daily.   60 tablet  11  . clopidogrel (PLAVIX) 75 MG tablet Take 1 tablet (75 mg total) by mouth daily.  30 tablet  11  . fenofibrate 160 MG tablet  Take 1 tablet (160 mg total) by mouth daily.  30 tablet  5  . insulin aspart (NOVOLOG) 100 UNIT/ML injection Inject 20 Units into the skin 2 (two) times daily with a meal.       . insulin glargine (LANTUS) 100 UNIT/ML injection Inject 50 Units into the skin daily.       . isosorbide mononitrate (IMDUR) 60 MG 24 hr tablet TAKE ONE TABLET BY MOUTH ONCE DAILY  90 tablet  3  . levothyroxine (SYNTHROID) 150 MCG tablet Take 75 mcg by mouth daily.   30 tablet  11  . Multiple Vitamin (MULTIVITAMIN WITH MINERALS) TABS Take 1 tablet by mouth daily.      . nitroGLYCERIN (NITROSTAT) 0.4 MG SL tablet Place 0.4 mg under the tongue every 5 (five) minutes as needed for chest pain.       Marland Kitchen oxyCODONE-acetaminophen (ROXICET) 5-325 MG per tablet Take 1-2 tablets by mouth every 4 (four) hours as needed  for pain.  20 tablet  0  . ranitidine (ZANTAC) 300 MG tablet Take 300 mg by mouth 2 (two) times daily.   30 tablet  11  . trolamine salicylate (ASPERCREME) 10 % cream Apply 1 application topically 2 (two) times daily as needed.       No current facility-administered medications on file prior to visit.    Allergies  Allergen Reactions  . Betadine [Povidone Iodine] Rash  . Iodinated Diagnostic Agents Rash    Does fine with premedications for cath    Past Medical History  Diagnosis Date  . Hypertension   . Hyperlipidemia   . Thyroid disease   . CAD (coronary artery disease)     with prior CABG in 1997 with redo in 2000; last cath in 2008 - managed medically  . Type 2 diabetes mellitus      with fair control  . Hypothyroidism   . Generalized weakness     without overt findings  . Dehydration     Mild dehydration, resolved  . Peripheral vascular disease   . Carotid disease, bilateral     with multiple bilateral carotid surgeries  . Psoriasis   . Meniere's disease   . Degenerative joint disease   . Gout   . Orthopnea     Two-pillow  . CRI (chronic renal insufficiency)   . CHF (congestive heart failure)   . GERD (gastroesophageal reflux disease)     Past Surgical History  Procedure Laterality Date  . Coronary artery bypass graft  1997 and 2000  . Carotid endarterectomy    . Lung removal, partial    . Laminectomies  July 2001    lumbar laminectomies  . Thoracotomy       left--due to fungal infection  . Shoulder surgery    . Cardiac catheterization    . Appendectomy    . Av fistula placement Left 01/21/2013    Procedure: ARTERIOVENOUS (AV) FISTULA CREATION, Left Brachiocephalic;  Surgeon: Chuck Hint, MD;  Location: [facility] OR;  Service: Vascular;  Laterality: Left;    History  Smoking status  . Never Smoker   Smokeless tobacco  . Never Used    History  Alcohol Use No    Family History  Problem Relation Age of Onset  . Heart attack Father   . Heart  disease Father   . Hyperlipidemia Father   . Hypertension Father   . Diabetes Mother   . Hypertension Mother   . Heart disease Brother   . Diabetes Brother  Reviw of Systems:  Reviewed in the HPI.  All other systems are negative.  Physical Exam: BP 188/78  Pulse 67  Ht 5\' 10"  (1.778 m)  Wt 208 lb (94.348 kg)  BMI 29.84 kg/m2 The patient is alert and oriented x 3.  The mood and affect are normal.   Skin: warm and dry.  Color is normal.    HEENT:   Normocephalic/atraumatic. Mucous membranes are normal.  Lungs: Lungs are clear.   Heart: Regular rate S1-S2. Has a 2-3/6 systolic ejection murmur cholesterol border.    Abdomen: His abdominal exam is benign.  Extremities:  No clubbing cyanosis or edema  Neuro:  Exam is nonfocal. Gait is normal. ECG: 04/10/2012-normal sinus rhythm at 62 beats a minute. He has an incomplete right bundle branch block. He has ST and T wave changes in the inferolateral leads.  These ST and T wave changes are slightly worse since his previous EKG.  He has known disease in his marginal system. He has small and severely diseased obtuse marginal arteries by heart catheterization in 2008. His last Myoview study revealed a small amount of lateral ischemia. I suspected a small amount of lateral ischemia is due to the small diffusely diseased marginal branches and is also responsible for these very mild ST-T wave changes in the lateral leads.   ECG:  Nov. 13, 2014:  NSR at 69, inc lRBBB, septal MI, NS ST abn.  Assessment / Plan:

## 2013-05-08 NOTE — Progress Notes (Signed)
REST  SPO2 94% HR63 BPM WALK >100FT SPO2 93% HR72 BPM POST WITHIN 1 MINUTE SPO2 90% HR 70 BPM

## 2013-05-09 ENCOUNTER — Other Ambulatory Visit: Payer: Self-pay | Admitting: *Deleted

## 2013-05-09 MED ORDER — NITROGLYCERIN 0.4 MG SL SUBL: 0.4000 mg | SUBLINGUAL_TABLET | SUBLINGUAL | Status: DC | PRN

## 2013-05-10 ENCOUNTER — Other Ambulatory Visit: Payer: Self-pay | Admitting: Cardiovascular Disease

## 2013-05-12 DIAGNOSIS — I10 Essential (primary) hypertension: Secondary | ICD-10-CM | POA: Diagnosis not present

## 2013-05-12 DIAGNOSIS — Z6831 Body mass index (BMI) 31.0-31.9, adult: Secondary | ICD-10-CM | POA: Diagnosis not present

## 2013-05-12 DIAGNOSIS — E1129 Type 2 diabetes mellitus with other diabetic kidney complication: Secondary | ICD-10-CM | POA: Diagnosis not present

## 2013-05-12 DIAGNOSIS — R05 Cough: Secondary | ICD-10-CM | POA: Diagnosis not present

## 2013-05-13 ENCOUNTER — Emergency Department (HOSPITAL_COMMUNITY)
Admission: EM | Admit: 2013-05-13 | Discharge: 2013-05-13 | Disposition: A | Discharge status: Home / Self Care | Payer: Medicare Other | Attending: Emergency Medicine | Admitting: Emergency Medicine

## 2013-05-13 ENCOUNTER — Encounter (HOSPITAL_COMMUNITY): Payer: Self-pay | Admitting: Emergency Medicine

## 2013-05-13 DIAGNOSIS — Z79899 Other long term (current) drug therapy: Secondary | ICD-10-CM | POA: Insufficient documentation

## 2013-05-13 DIAGNOSIS — Z7902 Long term (current) use of antithrombotics/antiplatelets: Secondary | ICD-10-CM | POA: Insufficient documentation

## 2013-05-13 DIAGNOSIS — K219 Gastro-esophageal reflux disease without esophagitis: Secondary | ICD-10-CM | POA: Diagnosis not present

## 2013-05-13 DIAGNOSIS — M109 Gout, unspecified: Secondary | ICD-10-CM | POA: Diagnosis not present

## 2013-05-13 DIAGNOSIS — E119 Type 2 diabetes mellitus without complications: Secondary | ICD-10-CM | POA: Insufficient documentation

## 2013-05-13 DIAGNOSIS — I119 Hypertensive heart disease without heart failure: Secondary | ICD-10-CM | POA: Diagnosis not present

## 2013-05-13 DIAGNOSIS — Z95818 Presence of other cardiac implants and grafts: Secondary | ICD-10-CM | POA: Diagnosis not present

## 2013-05-13 DIAGNOSIS — J392 Other diseases of pharynx: Secondary | ICD-10-CM | POA: Diagnosis not present

## 2013-05-13 DIAGNOSIS — Z8669 Personal history of other diseases of the nervous system and sense organs: Secondary | ICD-10-CM | POA: Insufficient documentation

## 2013-05-13 DIAGNOSIS — I2510 Atherosclerotic heart disease of native coronary artery without angina pectoris: Secondary | ICD-10-CM | POA: Insufficient documentation

## 2013-05-13 DIAGNOSIS — R040 Epistaxis: Secondary | ICD-10-CM

## 2013-05-13 DIAGNOSIS — Z7982 Long term (current) use of aspirin: Secondary | ICD-10-CM | POA: Insufficient documentation

## 2013-05-13 DIAGNOSIS — N189 Chronic kidney disease, unspecified: Secondary | ICD-10-CM | POA: Insufficient documentation

## 2013-05-13 DIAGNOSIS — I509 Heart failure, unspecified: Secondary | ICD-10-CM | POA: Diagnosis not present

## 2013-05-13 DIAGNOSIS — Z794 Long term (current) use of insulin: Secondary | ICD-10-CM | POA: Insufficient documentation

## 2013-05-13 DIAGNOSIS — I129 Hypertensive chronic kidney disease with stage 1 through stage 4 chronic kidney disease, or unspecified chronic kidney disease: Secondary | ICD-10-CM | POA: Diagnosis not present

## 2013-05-13 DIAGNOSIS — Z872 Personal history of diseases of the skin and subcutaneous tissue: Secondary | ICD-10-CM | POA: Diagnosis not present

## 2013-05-13 DIAGNOSIS — E785 Hyperlipidemia, unspecified: Secondary | ICD-10-CM | POA: Diagnosis not present

## 2013-05-13 DIAGNOSIS — Z951 Presence of aortocoronary bypass graft: Secondary | ICD-10-CM | POA: Diagnosis not present

## 2013-05-13 DIAGNOSIS — R6889 Other general symptoms and signs: Secondary | ICD-10-CM | POA: Diagnosis not present

## 2013-05-13 DIAGNOSIS — E039 Hypothyroidism, unspecified: Secondary | ICD-10-CM | POA: Insufficient documentation

## 2013-05-13 NOTE — Discharge Instructions (Signed)
Nosebleed  Nosebleeds can be caused by many conditions including trauma, infections, polyps, foreign bodies, dry mucous membranes or climate, medications and air conditioning. Most nosebleeds occur in the front of the nose. It is because of this location that most nosebleeds can be controlled by pinching the nostrils gently and continuously. Do this for at least 10 to 20 minutes. The reason for this long continuous pressure is that you must hold it long enough for the blood to clot. If during that 10 to 20 minute time period, pressure is released, the process may have to be started again. The nosebleed may stop by itself, quit with pressure, need concentrated heating (cautery) or stop with pressure from packing.  HOME CARE INSTRUCTIONS    If your nose was packed, try to maintain the pack inside until your caregiver removes it. If a gauze pack was used and it starts to fall out, gently replace or cut the end off. Do not cut if a balloon catheter was used to pack the nose. Otherwise, do not remove unless instructed.   Avoid blowing your nose for 12 hours after treatment. This could dislodge the pack or clot and start bleeding again.   If the bleeding starts again, sit up and bending forward, gently pinch the front half of your nose continuously for 20 minutes.   If bleeding was caused by dry mucous membranes, cover the inside of your nose every morning with a petroleum or antibiotic ointment. Use your little fingertip as an applicator. Do this as needed during dry weather. This will keep the mucous membranes moist and allow them to heal.   Maintain humidity in your home by using less air conditioning or using a humidifier.   Do not use aspirin or medications which make bleeding more likely. Your caregiver can give you recommendations on this.   Resume normal activities as able but try to avoid straining, lifting or bending at the waist for several days.   If the nosebleeds become recurrent and the cause is  unknown, your caregiver may suggest laboratory tests.  SEEK IMMEDIATE MEDICAL CARE IF:    Bleeding recurs and cannot be controlled.   There is unusual bleeding from or bruising on other parts of the body.   You have a fever.   Nosebleeds continue.   There is any worsening of the condition which originally brought you in.   You become lightheaded, feel faint, become sweaty or vomit blood.  MAKE SURE YOU:    Understand these instructions.   Will watch your condition.   Will get help right away if you are not doing well or get worse.  Document Released: 03/22/2005 Document Revised: 09/04/2011 Document Reviewed: 05/14/2009  [employer] Patient Information 2014 [employer].

## 2013-05-13 NOTE — ED Notes (Signed)
Pt states he blew nose this am and began to have a nosebleed. Pt called EMS. EMS applied pressure and gave 2 sprays of afrin nasal spray. No nose bleed on arrival. Pt on plavix.

## 2013-05-13 NOTE — ED Notes (Signed)
Bed: WA13 Expected date:  Expected time:  Means of arrival:  Comments: EMS- nosebleed 

## 2013-05-13 NOTE — ED Provider Notes (Addendum)
CSN: [id number]     Arrival date & time 05/13/13  0729 History   First MD Initiated Contact with Patient 05/13/13 0730     Chief Complaint  Patient presents with  . Epistaxis   (Consider location/radiation/quality/duration/timing/severity/associated sxs/prior Treatment) Patient is a 79 y.o. male presenting with nosebleeds. The history is provided by the patient and the spouse.  Epistaxis Location:  R nare Severity:  Moderate Duration:  1 hour Timing:  Constant Progression:  Partially resolved Chronicity:  New Context: anticoagulants and nose picking   Relieved by:  Vasoconstrictors Ineffective treatments:  Applying pressure Associated symptoms: blood in oropharynx   Associated symptoms: no congestion, no cough, no dizziness, no headaches, no sinus pain, no sneezing and no sore throat   Risk factors: no frequent nosebleeds, no intranasal steroids and no sinus problems     Past Medical History  Diagnosis Date  . Hypertension   . Hyperlipidemia   . Thyroid disease   . CAD (coronary artery disease)     with prior CABG in 1997 with redo in 2000; last cath in 2008 - managed medically  . Type 2 diabetes mellitus      with fair control  . Hypothyroidism   . Generalized weakness     without overt findings  . Dehydration     Mild dehydration, resolved  . Peripheral vascular disease   . Carotid disease, bilateral     with multiple bilateral carotid surgeries  . Psoriasis   . Meniere's disease   . Degenerative joint disease   . Gout   . Orthopnea     Two-pillow  . CRI (chronic renal insufficiency)   . CHF (congestive heart failure)   . GERD (gastroesophageal reflux disease)    Past Surgical History  Procedure Laterality Date  . Coronary artery bypass graft  1997 and 2000  . Carotid endarterectomy    . Lung removal, partial    . Laminectomies  July 2001    lumbar laminectomies  . Thoracotomy       left--due to fungal infection  . Shoulder surgery    . Cardiac  catheterization    . Appendectomy    . Av fistula placement Left 01/21/2013    Procedure: ARTERIOVENOUS (AV) FISTULA CREATION, Left Brachiocephalic;  Surgeon: Chuck Hint, MD;  Location: [facility];  Service: Vascular;  Laterality: Left;   Family History  Problem Relation Age of Onset  . Heart attack Father   . Heart disease Father   . Hyperlipidemia Father   . Hypertension Father   . Diabetes Mother   . Hypertension Mother   . Heart disease Brother   . Diabetes Brother    History  Substance Use Topics  . Smoking status: Never Smoker   . Smokeless tobacco: Never Used  . Alcohol Use: No    Review of Systems  HENT: Positive for nosebleeds. Negative for congestion, sneezing, sore throat and trouble swallowing.   Respiratory: Negative for cough.   Gastrointestinal: Negative for nausea.  Neurological: Negative for dizziness, light-headedness and headaches.  All other systems reviewed and are negative.    Allergies  Betadine and Iodinated diagnostic agents  Home Medications   Current Outpatient Rx  Name  Route  Sig  Dispense  Refill  . acetaminophen (TYLENOL) 500 MG tablet   Oral   Take 1,000 mg by mouth 2 (two) times daily as needed for mild pain or moderate pain.          Marland Kitchen allopurinol (ZYLOPRIM) 300  MG tablet   Oral   Take 1 tablet (300 mg total) by mouth daily.   30 tablet   11   . amLODipine (NORVASC) 10 MG tablet   Oral   Take 10 mg by mouth daily.         Marland Kitchen aspirin 325 MG tablet   Oral   Take 1 tablet (325 mg total) by mouth daily.   30 tablet   11   . atorvastatin (LIPITOR) 80 MG tablet   Oral   Take 80 mg by mouth daily.         . cholecalciferol (VITAMIN D) 1000 UNITS tablet   Oral   Take 1,000 Units by mouth daily.         . cloNIDine (CATAPRES) 0.2 MG tablet   Oral   Take 0.2 mg by mouth 2 (two) times daily.         . clopidogrel (PLAVIX) 75 MG tablet   Oral   Take 1 tablet (75 mg total) by mouth daily.   30 tablet   11    . fenofibrate 160 MG tablet   Oral   Take 160 mg by mouth daily.         . furosemide (LASIX) 80 MG tablet   Oral   Take 1 tablet (80 mg total) by mouth daily.   30 tablet   4   . insulin aspart (NOVOLOG) 100 UNIT/ML injection   Subcutaneous   Inject 20 Units into the skin 2 (two) times daily with a meal.          . insulin glargine (LANTUS) 100 UNIT/ML injection   Subcutaneous   Inject 50 Units into the skin daily.          . isosorbide mononitrate (IMDUR) 60 MG 24 hr tablet   Oral   Take 60 mg by mouth daily.         Marland Kitchen levothyroxine (SYNTHROID) 150 MCG tablet   Oral   Take 75 mcg by mouth daily.    30 tablet   11   . Multiple Vitamin (MULTIVITAMIN WITH MINERALS) TABS   Oral   Take 1 tablet by mouth daily.         . ranitidine (ZANTAC) 300 MG tablet   Oral   Take 300 mg by mouth 2 (two) times daily.    30 tablet   11   . trolamine salicylate (ASPERCREME) 10 % cream   Topical   Apply 1 application topically 2 (two) times daily as needed for muscle pain.          . nitroGLYCERIN (NITROSTAT) 0.4 MG SL tablet   Sublingual   Place 1 tablet (0.4 mg total) under the tongue every 5 (five) minutes as needed for chest pain.   25 tablet   3    BP 178/67  Pulse 60  Temp(Src) 97.3 F (36.3 C) (Oral)  Resp 16  SpO2 98% Physical Exam  Nursing note and vitals reviewed. Constitutional: He appears well-developed and well-nourished. No distress.  HENT:  Head: Normocephalic and atraumatic.  Nose: No mucosal edema. Epistaxis is observed. Right sinus exhibits no maxillary sinus tenderness. Left sinus exhibits no maxillary sinus tenderness.  Mouth/Throat: Uvula is midline.  Blood in oropharynx  Eyes: Conjunctivae and EOM are normal. No scleral icterus.  Neck: Normal range of motion. Neck supple.  Cardiovascular: Normal rate, regular rhythm and intact distal pulses.   Pulmonary/Chest: Effort normal. No respiratory distress. He has no wheezes.  Abdominal:  Soft. There is no tenderness.  Neurological: He is alert.  Skin: Skin is warm and dry. He is not diaphoretic.  Psychiatric: He has a normal mood and affect.    ED Course  Procedures (including critical care time) Labs Review Labs Reviewed - No data to display Imaging Review No results found.  EKG Interpretation   None      ra sat is 98% and I interpret to be normal  8:14 AM Pt is no longer bleeding.  Pt blew nose, no sig clots seen.  No further bleeding in oropharynx.  Upon further exam, there may be a small abrasion to medial wall of right nares.  NO active bleeding.  Will continue to monitor in the ED.    9:20 AM Pt had gone to the bathroom and began bleeding a  little more.  Had expelled a small clot and later in his bed, expelled a large clot.  Pt has had scant oozing into posterior oropharynx without resp compromise.  I do not think packing is needed at this time.  Pt has had no further bleeding from nares. On repeat exam, I am unable to visualize an area of injury or source of bleeding.  Pt had seen Dr. Lazarus Salines many years in the past, encouraged him to see ENT within the next couple of days and use Afrin 2 more times today, but to be cautious due to his h/o HTN.  MDM   1. Acute anterior epistaxis      Pt with mild trauma to nose, admits to scratching and "picking" it on the right side.  Pt is on plavix, has a h/o HTN.  EMS used Afrin spray to right nares and is currently slowed or nearly stopped.  Pt had only tried to stop the bleeding for about 10 minutes before calling 911.          Gavin Pound. Oletta Lamas, MD 05/13/13 1610  Gavin Pound. Oletta Lamas, MD 05/13/13 [phone]

## 2013-05-14 DIAGNOSIS — R040 Epistaxis: Secondary | ICD-10-CM | POA: Diagnosis not present

## 2013-05-29 ENCOUNTER — Other Ambulatory Visit (INDEPENDENT_AMBULATORY_CARE_PROVIDER_SITE_OTHER): Payer: Medicare Other

## 2013-05-29 ENCOUNTER — Encounter (HOSPITAL_COMMUNITY): Payer: Self-pay | Admitting: Emergency Medicine

## 2013-05-29 ENCOUNTER — Telehealth: Payer: Self-pay | Admitting: *Deleted

## 2013-05-29 ENCOUNTER — Emergency Department (HOSPITAL_COMMUNITY)
Admission: EM | Admit: 2013-05-29 | Discharge: 2013-05-29 | Disposition: A | Discharge status: Home / Self Care | Payer: Medicare Other | Attending: Emergency Medicine | Admitting: Emergency Medicine

## 2013-05-29 DIAGNOSIS — E039 Hypothyroidism, unspecified: Secondary | ICD-10-CM | POA: Insufficient documentation

## 2013-05-29 DIAGNOSIS — Z951 Presence of aortocoronary bypass graft: Secondary | ICD-10-CM | POA: Insufficient documentation

## 2013-05-29 DIAGNOSIS — K219 Gastro-esophageal reflux disease without esophagitis: Secondary | ICD-10-CM | POA: Diagnosis not present

## 2013-05-29 DIAGNOSIS — Z95818 Presence of other cardiac implants and grafts: Secondary | ICD-10-CM | POA: Diagnosis not present

## 2013-05-29 DIAGNOSIS — Z8669 Personal history of other diseases of the nervous system and sense organs: Secondary | ICD-10-CM | POA: Insufficient documentation

## 2013-05-29 DIAGNOSIS — Z8739 Personal history of other diseases of the musculoskeletal system and connective tissue: Secondary | ICD-10-CM | POA: Insufficient documentation

## 2013-05-29 DIAGNOSIS — I509 Heart failure, unspecified: Secondary | ICD-10-CM | POA: Insufficient documentation

## 2013-05-29 DIAGNOSIS — R0609 Other forms of dyspnea: Secondary | ICD-10-CM | POA: Diagnosis not present

## 2013-05-29 DIAGNOSIS — Z7982 Long term (current) use of aspirin: Secondary | ICD-10-CM | POA: Insufficient documentation

## 2013-05-29 DIAGNOSIS — I2510 Atherosclerotic heart disease of native coronary artery without angina pectoris: Secondary | ICD-10-CM | POA: Diagnosis not present

## 2013-05-29 DIAGNOSIS — N179 Acute kidney failure, unspecified: Secondary | ICD-10-CM | POA: Diagnosis not present

## 2013-05-29 DIAGNOSIS — E119 Type 2 diabetes mellitus without complications: Secondary | ICD-10-CM | POA: Diagnosis not present

## 2013-05-29 DIAGNOSIS — R0600 Dyspnea, unspecified: Secondary | ICD-10-CM

## 2013-05-29 DIAGNOSIS — Z79899 Other long term (current) drug therapy: Secondary | ICD-10-CM | POA: Diagnosis not present

## 2013-05-29 DIAGNOSIS — M109 Gout, unspecified: Secondary | ICD-10-CM | POA: Insufficient documentation

## 2013-05-29 DIAGNOSIS — Z872 Personal history of diseases of the skin and subcutaneous tissue: Secondary | ICD-10-CM | POA: Insufficient documentation

## 2013-05-29 DIAGNOSIS — Z794 Long term (current) use of insulin: Secondary | ICD-10-CM | POA: Diagnosis not present

## 2013-05-29 DIAGNOSIS — I10 Essential (primary) hypertension: Secondary | ICD-10-CM | POA: Diagnosis not present

## 2013-05-29 DIAGNOSIS — I129 Hypertensive chronic kidney disease with stage 1 through stage 4 chronic kidney disease, or unspecified chronic kidney disease: Secondary | ICD-10-CM | POA: Insufficient documentation

## 2013-05-29 DIAGNOSIS — R0602 Shortness of breath: Secondary | ICD-10-CM | POA: Diagnosis not present

## 2013-05-29 DIAGNOSIS — E785 Hyperlipidemia, unspecified: Secondary | ICD-10-CM | POA: Diagnosis not present

## 2013-05-29 DIAGNOSIS — N189 Chronic kidney disease, unspecified: Secondary | ICD-10-CM | POA: Diagnosis not present

## 2013-05-29 DIAGNOSIS — R5381 Other malaise: Secondary | ICD-10-CM | POA: Insufficient documentation

## 2013-05-29 DIAGNOSIS — Z7902 Long term (current) use of antithrombotics/antiplatelets: Secondary | ICD-10-CM | POA: Insufficient documentation

## 2013-05-29 LAB — CBC WITH DIFFERENTIAL/PLATELET
Basophils Absolute: 0 10*3/uL (ref 0.0–0.1)
Basophils Relative: 0 % (ref 0–1)
Eosinophils Absolute: 0.6 10*3/uL (ref 0.0–0.7)
Eosinophils Relative: 7 % — ABNORMAL HIGH (ref 0–5)
HCT: 39.3 % (ref 39.0–52.0)
Hemoglobin: 13.3 g/dL (ref 13.0–17.0)
Lymphocytes Relative: 17 % (ref 12–46)
MCH: 32.1 pg (ref 26.0–34.0)
MCHC: 33.8 g/dL (ref 30.0–36.0)
MCV: 94.9 fL (ref 78.0–100.0)
Monocytes Absolute: 0.5 10*3/uL (ref 0.1–1.0)
Monocytes Relative: 5 % (ref 3–12)
RBC: 4.14 MIL/uL — ABNORMAL LOW (ref 4.22–5.81)
RDW: 13.9 % (ref 11.5–15.5)
WBC: 8.3 10*3/uL (ref 4.0–10.5)

## 2013-05-29 LAB — COMPREHENSIVE METABOLIC PANEL
AST: 36 U/L (ref 0–37)
Albumin: 4.3 g/dL (ref 3.5–5.2)
BUN: 70 mg/dL — ABNORMAL HIGH (ref 6–23)
Calcium: 9.6 mg/dL (ref 8.4–10.5)
Chloride: 93 mEq/L — ABNORMAL LOW (ref 96–112)
Creatinine, Ser: 3.55 mg/dL — ABNORMAL HIGH (ref 0.50–1.35)
GFR calc non Af Amer: 15 mL/min — ABNORMAL LOW (ref >90)
Sodium: 135 mEq/L (ref 135–145)
Total Bilirubin: 0.5 mg/dL (ref 0.3–1.2)
Total Protein: 7.5 g/dL (ref 6.0–8.3)

## 2013-05-29 LAB — URINE MICROSCOPIC-ADD ON

## 2013-05-29 LAB — BASIC METABOLIC PANEL
BUN: 69 mg/dL — ABNORMAL HIGH (ref 6–23)
CO2: 31 mEq/L (ref 19–32)
Chloride: 96 mEq/L (ref 96–112)
GFR: 14.1 mL/min — CL (ref >60.00)
Glucose, Bld: 170 mg/dL — ABNORMAL HIGH (ref 70–99)
Potassium: 3.8 mEq/L (ref 3.5–5.1)
Sodium: 139 mEq/L (ref 135–145)

## 2013-05-29 LAB — URINALYSIS, ROUTINE W REFLEX MICROSCOPIC
Glucose, UA: NEGATIVE mg/dL
Leukocytes, UA: NEGATIVE
Protein, ur: 30 mg/dL — AB
Specific Gravity, Urine: 1.013 (ref 1.005–1.030)
Urobilinogen, UA: 0.2 mg/dL (ref 0.0–1.0)
pH: 5 (ref 5.0–8.0)

## 2013-05-29 MED ORDER — SODIUM CHLORIDE 0.9 % IV BOLUS (SEPSIS)
500.0000 mL | Freq: Once | INTRAVENOUS | Status: AC
  Administered 2013-05-29: 500 mL via INTRAVENOUS

## 2013-05-29 NOTE — ED Notes (Signed)
Phlebotomy paged

## 2013-05-29 NOTE — ED Provider Notes (Signed)
CSN: [id number]     Arrival date & time 05/29/13  1610 History   First MD Initiated Contact with Patient 05/29/13 1904     Chief Complaint  Patient presents with  . elevated creatanine    (Consider location/radiation/quality/duration/timing/severity/associated sxs/prior Treatment) HPI  79 year old male instructed to come to the emergency room for further evaluation of abnormal blood work. Patient recent blood work which showed a creatinine of 4.4. Blood work was done as screening after being started on furosemide approximately 3 weeks ago. Patient with no acute complaints. He has been having some shortness of breath and fatigue, but this has been ongoing and denies any acute change. He denies any significant change in symptoms since being started on the Lasix. Denies any acute pain. Hasn't appreciated any significant change in his urinary output. No unusual swelling. He thinks his weight has been fairly steady. He does have a history of chronic kidney disease, but is not sure of his exact baseline renal function.  Past Medical History  Diagnosis Date  . Hypertension   . Hyperlipidemia   . Thyroid disease   . CAD (coronary artery disease)     with prior CABG in 1997 with redo in 2000; last cath in 2008 - managed medically  . Type 2 diabetes mellitus      with fair control  . Hypothyroidism   . Generalized weakness     without overt findings  . Dehydration     Mild dehydration, resolved  . Peripheral vascular disease   . Carotid disease, bilateral     with multiple bilateral carotid surgeries  . Psoriasis   . Meniere's disease   . Degenerative joint disease   . Gout   . Orthopnea     Two-pillow  . CRI (chronic renal insufficiency)   . CHF (congestive heart failure)   . GERD (gastroesophageal reflux disease)    Past Surgical History  Procedure Laterality Date  . Coronary artery bypass graft  1997 and 2000  . Carotid endarterectomy    . Lung removal, partial    . Laminectomies   July 2001    lumbar laminectomies  . Thoracotomy       left--due to fungal infection  . Shoulder surgery    . Cardiac catheterization    . Appendectomy    . Av fistula placement Left 01/21/2013    Procedure: ARTERIOVENOUS (AV) FISTULA CREATION, Left Brachiocephalic;  Surgeon: Chuck Hint, MD;  Location: [facility] OR;  Service: Vascular;  Laterality: Left;   Family History  Problem Relation Age of Onset  . Heart attack Father   . Heart disease Father   . Hyperlipidemia Father   . Hypertension Father   . Diabetes Mother   . Hypertension Mother   . Heart disease Brother   . Diabetes Brother    History  Substance Use Topics  . Smoking status: Never Smoker   . Smokeless tobacco: Never Used  . Alcohol Use: No    Review of Systems  All systems reviewed and negative, other than as noted in HPI.   Allergies  Betadine and Iodinated diagnostic agents  Home Medications   Current Outpatient Rx  Name  Route  Sig  Dispense  Refill  . acetaminophen (TYLENOL) 500 MG tablet   Oral   Take 1,000 mg by mouth 2 (two) times daily as needed for mild pain or moderate pain.          Marland Kitchen allopurinol (ZYLOPRIM) 300 MG tablet   Oral  Take 1 tablet (300 mg total) by mouth daily.   30 tablet   11   . amLODipine (NORVASC) 10 MG tablet   Oral   Take 10 mg by mouth daily.         Marland Kitchen aspirin EC 325 MG tablet   Oral   Take 325 mg by mouth daily.         Marland Kitchen atorvastatin (LIPITOR) 80 MG tablet   Oral   Take 80 mg by mouth at bedtime.          . cholecalciferol (VITAMIN D) 1000 UNITS tablet   Oral   Take 1,000 Units by mouth at bedtime.          . cloNIDine (CATAPRES) 0.2 MG tablet   Oral   Take 0.2 mg by mouth 2 (two) times daily.         . clopidogrel (PLAVIX) 75 MG tablet   Oral   Take 1 tablet (75 mg total) by mouth daily.   30 tablet   11   . fenofibrate 160 MG tablet   Oral   Take 160 mg by mouth daily.         . insulin aspart (NOVOLOG FLEXPEN) 100  UNIT/ML SOPN FlexPen   Subcutaneous   Inject 20 Units into the skin 2 (two) times daily.         . Insulin Glargine (LANTUS SOLOSTAR) 100 UNIT/ML SOPN   Subcutaneous   Inject 50 Units into the skin daily.         . isosorbide mononitrate (IMDUR) 60 MG 24 hr tablet   Oral   Take 60 mg by mouth daily.         Marland Kitchen levothyroxine (SYNTHROID) 150 MCG tablet   Oral   Take 75 mcg by mouth daily.    30 tablet   11   . Multiple Vitamin (MULTIVITAMIN WITH MINERALS) TABS   Oral   Take 1 tablet by mouth at bedtime.          . nitroGLYCERIN (NITROSTAT) 0.4 MG SL tablet   Sublingual   Place 1 tablet (0.4 mg total) under the tongue every 5 (five) minutes as needed for chest pain.   25 tablet   3   . Polyethyl Glycol-Propyl Glycol (SYSTANE OP)   Both Eyes   Place 1 drop into both eyes daily as needed (dry eyes).         . polyethylene glycol powder (GLYCOLAX/MIRALAX) powder   Oral   Take 1 Container by mouth daily as needed (constipation).          . ranitidine (ZANTAC) 300 MG tablet   Oral   Take 300 mg by mouth 2 (two) times daily.    30 tablet   11   . trolamine salicylate (ASPERCREME) 10 % cream   Topical   Apply 1 application topically 2 (two) times daily as needed for muscle pain.           BP 151/95  Pulse 58  Temp(Src) 98.1 F (36.7 C) (Oral)  Resp 16  Ht 5\' 10"  (1.778 m)  Wt 201 lb 11.2 oz (91.491 kg)  BMI 28.94 kg/m2  SpO2 98% Physical Exam  Nursing note and vitals reviewed. Constitutional: He appears well-developed and well-nourished. No distress.  HENT:  Head: Normocephalic and atraumatic.  Eyes: Conjunctivae are normal. Right eye exhibits no discharge. Left eye exhibits no discharge.  Neck: Neck supple.  Cardiovascular: Normal rate, regular rhythm and normal heart sounds.  Exam reveals no gallop and no friction rub.   No murmur heard. Pulmonary/Chest: Effort normal and breath sounds normal. No respiratory distress.  Abdominal: Soft. He  exhibits no distension. There is no tenderness.  Musculoskeletal: He exhibits no edema and no tenderness.  Neurological: He is alert.  Skin: Skin is warm and dry.  Psychiatric: He has a normal mood and affect. His behavior is normal. Thought content normal.    ED Course  Procedures (including critical care time) Labs Review Labs Reviewed  CBC WITH DIFFERENTIAL - Abnormal; Notable for the following:    RBC 4.14 (*)    Eosinophils Relative 7 (*)    All other components within normal limits  COMPREHENSIVE METABOLIC PANEL - Abnormal; Notable for the following:    Chloride 93 (*)    Glucose, Bld 155 (*)    BUN 70 (*)    Creatinine, Ser 3.55 (*)    GFR calc non Af Amer 15 (*)    GFR calc Af Amer 18 (*)    All other components within normal limits  URINALYSIS, ROUTINE W REFLEX MICROSCOPIC - Abnormal; Notable for the following:    Hgb urine dipstick TRACE (*)    Protein, ur 30 (*)    All other components within normal limits  URINE MICROSCOPIC-ADD ON - Abnormal; Notable for the following:    Casts HYALINE CASTS (*)    All other components within normal limits   Imaging Review No results found.  EKG Interpretation   None       MDM   1. Acute on chronic renal failure      79 year old male with worsening renal function. Suspect related to recent furosemide usage. Patient reports that his urinary output has been good. He is dyspneic, but this has been an ongoing issue and he denies any acute change. Repeat creatinine 3.5. Three weeks ago and eight months ago was 2.8. He is not far from this if it is his baseline. Patient was given a small bolus of IVF. Instructed to stop his Lasix.  I do not feel that he needs emergent hospitalization at this time. He does need repeat blood work in a few days to make sure that his renal function is improving off the Lasix. Emergent return precautions were discussed with him, his wife and son.    Raeford Razor, MD 06/04/13 [phone]

## 2013-05-29 NOTE — Telephone Encounter (Signed)
Received call from lab with critical values: BUN: 69, CREAT: 4.4. GFR: 14.10. Spoke with patient's wife who states pt remains "very SOB" and "sleeps a lot" She feels pt's SOB seems unchanged, even after taking lasix 80mg  since last office visit. Spoke with Dr. Antoine Poche (DOD), who states to stop lasix and that pt should be seen in the ED. This information provided to patient and his wife. Patient agrees to go to ED at this time. Trish/[employer]master notified. Informed Dr. Elza Rafter office.

## 2013-05-29 NOTE — ED Notes (Signed)
Pt was sent here by pcp for elevated creat.  Per wife, 3weeks ago creat was 2.8nd today it was 4. 4 and his gfr has decreased to 14.  Pt was recently placed on Lasix for sob.  Pt has a graft to L arm but has not started dialysis yet.

## 2013-06-02 ENCOUNTER — Telehealth: Payer: Self-pay | Admitting: *Deleted

## 2013-06-02 NOTE — Telephone Encounter (Signed)
Spoke with wife/ f/u from Ed visit last week. Pt is off lasix and had stopped allopurinol per discussion with Ed physician and risk to kidneys. I removed it from his med list.  Pt has PCP app this Wednesday and an app with nephrology on 12/16. Pt was told to call with further questions or concerns, she agreed to plan.

## 2013-06-04 DIAGNOSIS — G4710 Hypersomnia, unspecified: Secondary | ICD-10-CM | POA: Diagnosis not present

## 2013-06-04 DIAGNOSIS — F0280 Dementia in other diseases classified elsewhere without behavioral disturbance: Secondary | ICD-10-CM | POA: Diagnosis not present

## 2013-06-04 DIAGNOSIS — R0602 Shortness of breath: Secondary | ICD-10-CM | POA: Diagnosis not present

## 2013-06-04 DIAGNOSIS — R0609 Other forms of dyspnea: Secondary | ICD-10-CM | POA: Diagnosis not present

## 2013-06-04 DIAGNOSIS — M109 Gout, unspecified: Secondary | ICD-10-CM | POA: Diagnosis not present

## 2013-06-04 DIAGNOSIS — Z6831 Body mass index (BMI) 31.0-31.9, adult: Secondary | ICD-10-CM | POA: Diagnosis not present

## 2013-06-04 DIAGNOSIS — N189 Chronic kidney disease, unspecified: Secondary | ICD-10-CM | POA: Diagnosis not present

## 2013-06-10 ENCOUNTER — Encounter: Payer: Self-pay | Admitting: Cardiovascular Disease

## 2013-06-10 DIAGNOSIS — I129 Hypertensive chronic kidney disease with stage 1 through stage 4 chronic kidney disease, or unspecified chronic kidney disease: Secondary | ICD-10-CM | POA: Diagnosis not present

## 2013-06-10 DIAGNOSIS — M109 Gout, unspecified: Secondary | ICD-10-CM | POA: Diagnosis not present

## 2013-06-10 DIAGNOSIS — R0609 Other forms of dyspnea: Secondary | ICD-10-CM | POA: Diagnosis not present

## 2013-06-10 DIAGNOSIS — N184 Chronic kidney disease, stage 4 (severe): Secondary | ICD-10-CM | POA: Diagnosis not present

## 2013-06-10 DIAGNOSIS — I770 Arteriovenous fistula, acquired: Secondary | ICD-10-CM | POA: Diagnosis not present

## 2013-07-11 DIAGNOSIS — G4710 Hypersomnia, unspecified: Secondary | ICD-10-CM | POA: Diagnosis not present

## 2013-07-15 DIAGNOSIS — I10 Essential (primary) hypertension: Secondary | ICD-10-CM | POA: Diagnosis not present

## 2013-07-15 DIAGNOSIS — IMO0001 Reserved for inherently not codable concepts without codable children: Secondary | ICD-10-CM | POA: Diagnosis not present

## 2013-07-15 DIAGNOSIS — E1129 Type 2 diabetes mellitus with other diabetic kidney complication: Secondary | ICD-10-CM | POA: Diagnosis not present

## 2013-07-15 DIAGNOSIS — Z6831 Body mass index (BMI) 31.0-31.9, adult: Secondary | ICD-10-CM | POA: Diagnosis not present

## 2013-08-06 ENCOUNTER — Telehealth: Payer: Self-pay | Admitting: Cardiovascular Disease

## 2013-08-06 DIAGNOSIS — E11311 Type 2 diabetes mellitus with unspecified diabetic retinopathy with macular edema: Secondary | ICD-10-CM | POA: Diagnosis not present

## 2013-08-06 DIAGNOSIS — E11339 Type 2 diabetes mellitus with moderate nonproliferative diabetic retinopathy without macular edema: Secondary | ICD-10-CM | POA: Diagnosis not present

## 2013-08-06 DIAGNOSIS — E1139 Type 2 diabetes mellitus with other diabetic ophthalmic complication: Secondary | ICD-10-CM | POA: Diagnosis not present

## 2013-08-06 DIAGNOSIS — H35079 Retinal telangiectasis, unspecified eye: Secondary | ICD-10-CM | POA: Diagnosis not present

## 2013-08-06 NOTE — Telephone Encounter (Signed)
New problem   Pt would like to speak to nurse concerning pt's appt on 08/08/13, would like to discuss something. Please call pt.

## 2013-08-06 NOTE — Telephone Encounter (Signed)
Patient wanted to know purpose of upcoming appointment this Friday.  Patient's recent BP this week is 147/69.  Dr. Acie Fredrickson advised that the appointment is a 66mo follow up to med changes and to check status of elevated BP.  Advised patient of Dr. Elmarie Shiley comment.  Patient agreed to treatment plan and to keep this Friday's appointment. Confirmed appointment time for 8:15 am.

## 2013-08-08 ENCOUNTER — Encounter: Payer: Self-pay | Admitting: Cardiovascular Disease

## 2013-08-08 ENCOUNTER — Ambulatory Visit (INDEPENDENT_AMBULATORY_CARE_PROVIDER_SITE_OTHER): Payer: Medicare Other | Admitting: Cardiovascular Disease

## 2013-08-08 VITALS — BP 140/60 | HR 56 | Ht 70.0 in | Wt 200.0 lb

## 2013-08-08 DIAGNOSIS — I10 Essential (primary) hypertension: Secondary | ICD-10-CM

## 2013-08-08 DIAGNOSIS — I509 Heart failure, unspecified: Secondary | ICD-10-CM | POA: Diagnosis not present

## 2013-08-08 DIAGNOSIS — I5032 Chronic diastolic (congestive) heart failure: Secondary | ICD-10-CM

## 2013-08-08 DIAGNOSIS — E785 Hyperlipidemia, unspecified: Secondary | ICD-10-CM

## 2013-08-08 NOTE — Progress Notes (Signed)
[name] Date of Birth  January 11, 1934 [facility] [address]    [place], [place]  [address] [phone]  Fax  [phone]  History of Present Illness:  [name] is a 80 year old gentleman with a history of coronary artery disease and coronary artery bypass grafting. He recently presented with some right-sided shoulder pain that radiates through to his chest. He had a stress Myoview study which revealed a small inferolateral defect. He was set up for cardiac catheterization but his precath labs revealed a creatinine of 2.4 which was new. His baseline creatinine is between 1.6 and 1.8. We discontinued his HCTZ and his ACE inhibitor.  He was seen by his medical doctor who also stop his metformin.  We were going to do a cardiac catheterization this winter but we canceled the case because of his renal insufficiency. His metformin was stopped and he made some other medical changes in his creatinine has now improved. Fortunately, he's not having any further episodes of angina.  He informed me that his insurance company is no longer paying for Ranexa.  He's been bradycardic for years. We do not have him on beta blocker for that reason.  He's had some shoulder problems. He needs to have surgery on his right shoulder soon. He will eventually need to have surgery on his left shoulder. He had a low risk  Myoview study in November, 2012. He had an echocardiogram in August, 2013 that was also unchanged. He does not follow a strict diabetic diet.  His glucose levels went very high after his steroid injection ( > 500)  September 27, 2012:  Pt is doing well.  He denies any chest pain. Has some shoulder soreness.  He does have chronic dyspnea and has had worsening dyspnea since he had the flu in January.  He stays away from salt.  Nov. 13, 2014;  He contniues to have dyspnea an CP.  He did not take a NTG.  He typically gets CP with exertion.   He is short of breath at rest and with  exertion.  He seems to minimize his symptoms - wife thinks he has more shortness of breath that he admits to.    i was asked to see him again by Dr. Lorrene Reid for worsening dyspnea.  He avoids  salt.   Echo showed LVEF of 65-70% with LVH, .mild aortic stenosis  Feb. 13, 2015:  Pt is feeling well.  No angina.  Kidney function remained stable. His blood pressure is much better controlled during this visit compared to his last visit 3 months ago. He has been staying with her salt  Current Outpatient Prescriptions on File Prior to Visit  Medication Sig Dispense Refill  . acetaminophen (TYLENOL) 500 MG tablet Take 1,000 mg by mouth 2 (two) times daily as needed for mild pain or moderate pain.       Marland Kitchen amLODipine (NORVASC) 10 MG tablet Take 10 mg by mouth daily.      Marland Kitchen aspirin EC 325 MG tablet Take 325 mg by mouth daily.      Marland Kitchen atorvastatin (LIPITOR) 80 MG tablet Take 80 mg by mouth at bedtime.       . cholecalciferol (VITAMIN D) 1000 UNITS tablet Take 1,000 Units by mouth at bedtime.       . cloNIDine (CATAPRES) 0.2 MG tablet Take 0.2 mg by mouth 2 (two) times daily.      . clopidogrel (PLAVIX) 75 MG tablet Take 1 tablet (  75 mg total) by mouth daily.  30 tablet  11  . fenofibrate 160 MG tablet Take 160 mg by mouth daily.      . insulin aspart (NOVOLOG FLEXPEN) 100 UNIT/ML SOPN FlexPen Inject 15 Units into the skin 2 (two) times daily.       . Insulin Glargine (LANTUS SOLOSTAR) 100 UNIT/ML SOPN Inject 30 Units into the skin daily.       . isosorbide mononitrate (IMDUR) 60 MG 24 hr tablet Take 60 mg by mouth daily.      Marland Kitchen levothyroxine (SYNTHROID) 150 MCG tablet Take 75 mcg by mouth daily.   30 tablet  11  . Multiple Vitamin (MULTIVITAMIN WITH MINERALS) TABS Take 1 tablet by mouth at bedtime.       . nitroGLYCERIN (NITROSTAT) 0.4 MG SL tablet Place 1 tablet (0.4 mg total) under the tongue every 5 (five) minutes as needed for chest pain.  25 tablet  3  . Polyethyl Glycol-Propyl Glycol (SYSTANE OP) Place 1  drop into both eyes daily as needed (dry eyes).      . polyethylene glycol powder (GLYCOLAX/MIRALAX) powder Take 1 Container by mouth daily as needed (constipation).       . ranitidine (ZANTAC) 300 MG tablet Take 300 mg by mouth 2 (two) times daily.   30 tablet  11  . trolamine salicylate (ASPERCREME) 10 % cream Apply 1 application topically 2 (two) times daily as needed for muscle pain.        No current facility-administered medications on file prior to visit.    Allergies  Allergen Reactions  . Betadine [Povidone Iodine] Rash  . Iodinated Diagnostic Agents Rash    Does fine with premedications for cath    Past Medical History  Diagnosis Date  . Hypertension   . Hyperlipidemia   . Thyroid disease   . CAD (coronary artery disease)     with prior CABG in 1997 with redo in 2000; last cath in 2008 - managed medically  . Type 2 diabetes mellitus      with fair control  . Hypothyroidism   . Generalized weakness     without overt findings  . Dehydration     Mild dehydration, resolved  . Peripheral vascular disease   . Carotid disease, bilateral     with multiple bilateral carotid surgeries  . Psoriasis   . Meniere's disease   . Degenerative joint disease   . Gout   . Orthopnea     Two-pillow  . CRI (chronic renal insufficiency)   . CHF (congestive heart failure)   . GERD (gastroesophageal reflux disease)     Past Surgical History  Procedure Laterality Date  . Coronary artery bypass graft  1997 and 2000  . Carotid endarterectomy    . Lung removal, partial    . Laminectomies  July 2001    lumbar laminectomies  . Thoracotomy       left--due to fungal infection  . Shoulder surgery    . Cardiac catheterization    . Appendectomy    . Av fistula placement Left 01/21/2013    Procedure: ARTERIOVENOUS (AV) FISTULA CREATION, Left Brachiocephalic;  Surgeon: Angelia Mould, MD;  Location: [facility];  Service: Vascular;  Laterality: Left;    History  Smoking status  .  Never Smoker   Smokeless tobacco  . Never Used    History  Alcohol Use No    Family History  Problem Relation Age of Onset  . Heart attack Father   .  Heart disease Father   . Hyperlipidemia Father   . Hypertension Father   . Diabetes Mother   . Hypertension Mother   . Heart disease Brother   . Diabetes Brother     Reviw of Systems:  Reviewed in the HPI.  All other systems are negative.  Physical Exam: BP 140/60  Pulse 56  Ht 5\' 10"  (1.778 m)  Wt 200 lb (90.719 kg)  BMI 28.70 kg/m2 The patient is alert and oriented x 3.  The mood and affect are normal.   Skin: warm and dry.  Color is normal.   HEENT:   Normocephalic/atraumatic. Mucous membranes are normal. Lungs: Lungs are clear.  Heart: Regular rate S1-S2. Has a 2/6 systolic ejection murmur cholesterol border.   Abdomen: His abdominal exam is benign. Extremities:  No clubbing cyanosis or edema Neuro:  Exam is nonfocal. Gait is normal.  ECG: 08/08/2013: Sinus bradycardia at 56. He has a right axis deviation. He has no ST or T wave changes. Assessment / Plan:

## 2013-08-08 NOTE — Assessment & Plan Note (Signed)
We'll check fasting labs at his next visit in 6 months

## 2013-08-08 NOTE — Patient Instructions (Signed)
Your physician wants you to follow-up in: [place] DR. Acie Fredrickson You will receive a reminder letter in the mail two months in advance. If you don't receive a letter, please call our office to schedule the follow-up appointment.  Your physician recommends that you return for lab work in: 6 MONTHS (LIPIDS, LIVER, BMET)  Your physician recommends that you continue on your current medications as directed. Please refer to the Current Medication list given to you today.

## 2013-08-08 NOTE — Assessment & Plan Note (Signed)
His breathing is stable. He has normal left ventricular systolic function. He has mild aortic stenosis.

## 2013-08-08 NOTE — Assessment & Plan Note (Signed)
[name]'s blood pressure is much better controled.    Continue current meds.  As his kidney function declines, I expect that he will need to go on dialysis and I would expect that his BP medication requirements will decrease.

## 2013-08-19 ENCOUNTER — Other Ambulatory Visit: Payer: Self-pay | Admitting: Vascular Surgery

## 2013-08-19 DIAGNOSIS — Z48812 Encounter for surgical aftercare following surgery on the circulatory system: Secondary | ICD-10-CM

## 2013-08-19 DIAGNOSIS — I6529 Occlusion and stenosis of unspecified carotid artery: Secondary | ICD-10-CM

## 2013-08-27 DIAGNOSIS — Z6830 Body mass index (BMI) 30.0-30.9, adult: Secondary | ICD-10-CM | POA: Diagnosis not present

## 2013-08-27 DIAGNOSIS — E1129 Type 2 diabetes mellitus with other diabetic kidney complication: Secondary | ICD-10-CM | POA: Diagnosis not present

## 2013-08-27 DIAGNOSIS — I10 Essential (primary) hypertension: Secondary | ICD-10-CM | POA: Diagnosis not present

## 2013-08-27 DIAGNOSIS — N189 Chronic kidney disease, unspecified: Secondary | ICD-10-CM | POA: Diagnosis not present

## 2013-09-13 ENCOUNTER — Other Ambulatory Visit: Payer: Self-pay | Admitting: Cardiovascular Disease

## 2013-09-15 ENCOUNTER — Ambulatory Visit: Payer: Self-pay | Admitting: Podiatry

## 2013-09-16 DIAGNOSIS — I129 Hypertensive chronic kidney disease with stage 1 through stage 4 chronic kidney disease, or unspecified chronic kidney disease: Secondary | ICD-10-CM | POA: Diagnosis not present

## 2013-09-16 DIAGNOSIS — I770 Arteriovenous fistula, acquired: Secondary | ICD-10-CM | POA: Diagnosis not present

## 2013-09-16 DIAGNOSIS — N184 Chronic kidney disease, stage 4 (severe): Secondary | ICD-10-CM | POA: Diagnosis not present

## 2013-09-16 DIAGNOSIS — M109 Gout, unspecified: Secondary | ICD-10-CM | POA: Diagnosis not present

## 2013-09-29 ENCOUNTER — Ambulatory Visit (INDEPENDENT_AMBULATORY_CARE_PROVIDER_SITE_OTHER): Payer: Medicare Other | Admitting: Podiatry

## 2013-09-29 ENCOUNTER — Encounter: Payer: Self-pay | Admitting: Podiatry

## 2013-09-29 VITALS — BP 123/52 | HR 55 | Resp 18

## 2013-09-29 DIAGNOSIS — R0989 Other specified symptoms and signs involving the circulatory and respiratory systems: Secondary | ICD-10-CM | POA: Diagnosis not present

## 2013-09-29 DIAGNOSIS — B351 Tinea unguium: Secondary | ICD-10-CM | POA: Diagnosis not present

## 2013-09-29 DIAGNOSIS — I6529 Occlusion and stenosis of unspecified carotid artery: Secondary | ICD-10-CM

## 2013-09-29 DIAGNOSIS — M79609 Pain in unspecified limb: Secondary | ICD-10-CM | POA: Diagnosis not present

## 2013-09-29 DIAGNOSIS — Q828 Other specified congenital malformations of skin: Secondary | ICD-10-CM | POA: Diagnosis not present

## 2013-09-29 NOTE — Progress Notes (Signed)
   Subjective:    Patient ID: [name], male    DOB: 05/01/1934, 80 y.o.   MRN: [id number]  HPI I was referred by Dr. Philip Aspen and I need my toenails trimmed and I have a callus on the side of my right foot and it has been there I think about 10 years now and hurts when I walk and I have used a bandaid and I used a razor on the callus and I did trim my own nails about a month ago and I am a diabetic and have been since about 1997.    Review of Systems  HENT: Positive for hearing loss.   Respiratory: Positive for shortness of breath.   Hematological: Bruises/bleeds easily.       Slow to heal  All other systems reviewed and are negative.       Objective:   Physical Exam Orientated x56 80 year old male  Vascular: DP pulses 2/4 bilaterally. PT pulses 1/4 bilaterally  Neurological: Sensation detained in monofilament wire intact 4/5 right and 34/5 left. Vibratory sensation nonreactive bilaterally. Ankle reflexes reactive bilaterally.  Dermatological: Atrophic skin without any hair growth noted. Surgical scar noted right lower leg. The toenails are elongated, discolored, incurvated. A nucleated keratoses plantar fifth right MPJ  Musculoskeletal: Bilateral HAV deformities and bilateral hammertoes 2/5 bilaterally.       Assessment & Plan:   Assessment: Peripheral arterial disease with diminished pedal pulses Diabetic peripheral neuropathy bilaterally Loss of protective sensation bilaterally Symptomatic onychomycoses x10 Porokeratoses x1  Plan: Nails x10 and keratoses x1 debrided without any bleeding  Referred to vascular lab for arterial Doppler for the indication of diabetic patient with diminished pedal pulses Patient provided with General Information about diabetic footcare at discharge  Notify patient of results of vascular lab Reappoint at three-month intervals for skin and nail debridement

## 2013-09-29 NOTE — Patient Instructions (Signed)
I office will schedule an arterial Doppler to evaluate your circulation in your legs and feet. Will notify you of results  Diabetes and Foot Care Diabetes may cause you to have problems because of poor blood supply (circulation) to your feet and legs. This may cause the skin on your feet to become thinner, break easier, and heal more slowly. Your skin may become dry, and the skin may peel and crack. You may also have nerve damage in your legs and feet causing decreased feeling in them. You may not notice minor injuries to your feet that could lead to infections or more serious problems. Taking care of your feet is one of the most important things you can do for yourself.  HOME CARE INSTRUCTIONS  Wear shoes at all times, even in the house. Do not go barefoot. Bare feet are easily injured.  Check your feet daily for blisters, cuts, and redness. If you cannot see the bottom of your feet, use a mirror or ask someone for help.  Wash your feet with warm water (do not use hot water) and mild soap. Then pat your feet and the areas between your toes until they are completely dry. Do not soak your feet as this can dry your skin.  Apply a moisturizing lotion or petroleum jelly (that does not contain alcohol and is unscented) to the skin on your feet and to dry, brittle toenails. Do not apply lotion between your toes.  Trim your toenails straight across. Do not dig under them or around the cuticle. File the edges of your nails with an emery board or nail file.  Do not cut corns or calluses or try to remove them with medicine.  Wear clean socks or stockings every day. Make sure they are not too tight. Do not wear knee-high stockings since they may decrease blood flow to your legs.  Wear shoes that fit properly and have enough cushioning. To break in new shoes, wear them for just a few hours a day. This prevents you from injuring your feet. Always look in your shoes before you put them on to be sure there  are no objects inside.  Do not cross your legs. This may decrease the blood flow to your feet.  If you find a minor scrape, cut, or break in the skin on your feet, keep it and the skin around it clean and dry. These areas may be cleansed with mild soap and water. Do not cleanse the area with peroxide, alcohol, or iodine.  When you remove an adhesive bandage, be sure not to damage the skin around it.  If you have a wound, look at it several times a day to make sure it is healing.  Do not use heating pads or hot water bottles. They may burn your skin. If you have lost feeling in your feet or legs, you may not know it is happening until it is too late.  Make sure your health care provider performs a complete foot exam at least annually or more often if you have foot problems. Report any cuts, sores, or bruises to your health care provider immediately. SEEK MEDICAL CARE IF:   You have an injury that is not healing.  You have cuts or breaks in the skin.  You have an ingrown nail.  You notice redness on your legs or feet.  You feel burning or tingling in your legs or feet.  You have pain or cramps in your legs and feet.  Your  legs or feet are numb.  Your feet always feel cold. SEEK IMMEDIATE MEDICAL CARE IF:   There is increasing redness, swelling, or pain in or around a wound.  There is a red line that goes up your leg.  Pus is coming from a wound.  You develop a fever or as directed by your health care provider.  You notice a bad smell coming from an ulcer or wound. Document Released: 06/09/2000 Document Revised: 02/12/2013 Document Reviewed: 11/19/2012 [facility] Patient Information 2014 [place].

## 2013-09-30 ENCOUNTER — Encounter: Payer: Self-pay | Admitting: Podiatry

## 2013-10-06 DIAGNOSIS — E1159 Type 2 diabetes mellitus with other circulatory complications: Secondary | ICD-10-CM | POA: Diagnosis not present

## 2013-10-06 DIAGNOSIS — I739 Peripheral vascular disease, unspecified: Secondary | ICD-10-CM | POA: Diagnosis not present

## 2013-10-06 DIAGNOSIS — Z6830 Body mass index (BMI) 30.0-30.9, adult: Secondary | ICD-10-CM | POA: Diagnosis not present

## 2013-10-06 DIAGNOSIS — I2510 Atherosclerotic heart disease of native coronary artery without angina pectoris: Secondary | ICD-10-CM | POA: Diagnosis not present

## 2013-10-06 DIAGNOSIS — I679 Cerebrovascular disease, unspecified: Secondary | ICD-10-CM | POA: Diagnosis not present

## 2013-10-06 DIAGNOSIS — I10 Essential (primary) hypertension: Secondary | ICD-10-CM | POA: Diagnosis not present

## 2013-10-07 ENCOUNTER — Ambulatory Visit (HOSPITAL_COMMUNITY)
Admission: RE | Admit: 2013-10-07 | Discharge: 2013-10-07 | Disposition: A | Discharge status: Home / Self Care | Payer: Medicare Other | Source: Ambulatory Visit | Attending: Cardiovascular Disease | Admitting: Cardiovascular Disease

## 2013-10-07 DIAGNOSIS — R0989 Other specified symptoms and signs involving the circulatory and respiratory systems: Secondary | ICD-10-CM | POA: Insufficient documentation

## 2013-10-07 NOTE — Progress Notes (Addendum)
Lower Extremity Arterial Duplex Completed. °Brianna L Mazza,RVT °

## 2013-10-10 ENCOUNTER — Telehealth (HOSPITAL_COMMUNITY): Payer: Self-pay | Admitting: *Deleted

## 2013-10-10 ENCOUNTER — Telehealth: Payer: Self-pay | Admitting: *Deleted

## 2013-10-10 DIAGNOSIS — I739 Peripheral vascular disease, unspecified: Secondary | ICD-10-CM

## 2013-10-10 NOTE — Telephone Encounter (Signed)
Message copied by Lolita Rieger on Fri Oct 10, 2013 12:01 PM ------      Message from: Gean Birchwood      Created: Thu Oct 09, 2013 10:34 AM       The results of the lower extremity arterial Doppler dated 10/08/2013 are abnormal. Schedule an appointment with Dr. Gwenlyn Found for followup on this abnormal Doppler in notify patient that he will require a followup examination ------

## 2013-10-10 NOTE — Telephone Encounter (Signed)
Call me back about an appointment we were supposed to set up for a doctor for [name] to see.  I returned her call.  She stated she got everything straightened out.  He has an appointment with Dr. Scot Dock on 11/12/13 at 9:30am.

## 2013-10-10 NOTE — Telephone Encounter (Signed)
I called to inform the patient that his doppler study was abnormal per Dr. Amalia Hailey.  I spoke to his wife, Pamala Hurry.  I told her that Dr. Amalia Hailey wants to refer the patient to Dr. Gwenlyn Found.  She stated he's already a patient of Dr. Scot Dock at [facility].  She asked that we refer him back to Dr. Scot Dock for the consultation.  I told her she should hear something from them to get him scheduled.

## 2013-11-11 ENCOUNTER — Encounter: Payer: Self-pay | Admitting: Vascular Surgery

## 2013-11-12 ENCOUNTER — Ambulatory Visit (INDEPENDENT_AMBULATORY_CARE_PROVIDER_SITE_OTHER): Payer: Medicare Other | Admitting: Vascular Surgery

## 2013-11-12 ENCOUNTER — Encounter: Payer: Self-pay | Admitting: Vascular Surgery

## 2013-11-12 VITALS — BP 172/57 | HR 54 | Ht 70.0 in | Wt 202.7 lb

## 2013-11-12 DIAGNOSIS — I739 Peripheral vascular disease, unspecified: Secondary | ICD-10-CM

## 2013-11-12 NOTE — Progress Notes (Signed)
[facility]  Patient name: [name] MRN: [id number] DOB: 07-Mar-1934 Sex: male  REASON FOR VISIT: Follow up of peripheral vascular disease.  HPI: [name] is a 80 y.o. male who I last saw on 03/12/2013. At that time I was seeing him for follow up after placement of a left brachiocephalic AV fistula. He also had known carotid disease, and a carotid duplex scan at that visit showed a 40-59% recurrent right internal carotid artery stenosis with no significant stenosis on the left. He was to have a follow carotid duplex scan in 1 year.  He was having some work done by his podiatrist who was concerned about her circulation. He underwent a Doppler study in April at another lab which showed an ABI of 67% on the right and 72% on the left. He sent for vascular consultation.  He denies any history of claudication, although I think his activity is fairly limited. He denies any history of rest pain. He denies any nonhealing ulcers. He is not a smoker.  With respect to his chronic kidney disease he is not on dialysis. He also remains a symptomatic from a carotid standpoint.  Past Medical History  Diagnosis Date  . Hypertension   . Hyperlipidemia   . Thyroid disease   . CAD (coronary artery disease)     with prior CABG in 1997 with redo in 2000; last cath in 2008 - managed medically  . Type 2 diabetes mellitus      with fair control  . Hypothyroidism   . Generalized weakness     without overt findings  . Dehydration     Mild dehydration, resolved  . Peripheral vascular disease   . Carotid disease, bilateral     with multiple bilateral carotid surgeries  . Psoriasis   . Meniere's disease   . Degenerative joint disease   . Gout   . Orthopnea     Two-pillow  . CRI (chronic renal insufficiency)   . CHF (congestive heart failure)   . GERD (gastroesophageal reflux disease)    Family History  Problem Relation Age of Onset  . Heart attack Father   .  Heart disease Father   . Hyperlipidemia Father   . Hypertension Father   . Diabetes Mother   . Hypertension Mother   . Heart disease Brother   . Diabetes Brother    SOCIAL HISTORY: History  Substance Use Topics  . Smoking status: Never Smoker   . Smokeless tobacco: Never Used  . Alcohol Use: No   Allergies  Allergen Reactions  . Betadine [Povidone Iodine] Rash  . Iodinated Diagnostic Agents Rash    Does fine with premedications for cath   Current Outpatient Prescriptions  Medication Sig Dispense Refill  . acetaminophen (TYLENOL) 500 MG tablet Take 1,000 mg by mouth 2 (two) times daily as needed for mild pain or moderate pain.       Marland Kitchen amLODipine (NORVASC) 10 MG tablet TAKE ONE TABLET BY MOUTH ONCE DAILY  30 tablet  6  . aspirin EC 325 MG tablet Take 325 mg by mouth daily.      Marland Kitchen atorvastatin (LIPITOR) 80 MG tablet Take 80 mg by mouth at bedtime.       . cholecalciferol (VITAMIN D) 1000 UNITS tablet Take 1,000 Units by mouth at bedtime.       . cloNIDine (CATAPRES) 0.2 MG tablet Take 0.2 mg by mouth 2 (two) times daily.      Marland Kitchen  clopidogrel (PLAVIX) 75 MG tablet Take 1 tablet (75 mg total) by mouth daily.  30 tablet  11  . fenofibrate 160 MG tablet TAKE ONE TABLET BY MOUTH ONCE DAILY  30 tablet  6  . insulin aspart (NOVOLOG FLEXPEN) 100 UNIT/ML SOPN FlexPen Inject 15 Units into the skin 2 (two) times daily.       . Insulin Aspart Prot & Aspart (NOVOLOG MIX 70/30 FLEXPEN) (70-30) 100 UNIT/ML Pen Inject 15 Units into the skin 2 (two) times daily.      . Insulin Glargine (LANTUS SOLOSTAR) 100 UNIT/ML SOPN Inject 30 Units into the skin daily.       . isosorbide mononitrate (IMDUR) 60 MG 24 hr tablet Take 60 mg by mouth daily.      Marland Kitchen levothyroxine (SYNTHROID) 150 MCG tablet Take 75 mcg by mouth daily.   30 tablet  11  . metoprolol succinate (TOPROL-XL) 25 MG 24 hr tablet       . Multiple Vitamin (MULTIVITAMIN WITH MINERALS) TABS Take 1 tablet by mouth at bedtime.       . ranitidine  (ZANTAC) 300 MG tablet Take 300 mg by mouth 2 (two) times daily.   30 tablet  11  . nitroGLYCERIN (NITROSTAT) 0.4 MG SL tablet Place 1 tablet (0.4 mg total) under the tongue every 5 (five) minutes as needed for chest pain.  25 tablet  3  . Polyethyl Glycol-Propyl Glycol (SYSTANE OP) Place 1 drop into both eyes daily as needed (dry eyes).      . polyethylene glycol powder (GLYCOLAX/MIRALAX) powder Take 1 Container by mouth daily as needed (constipation).       . trolamine salicylate (ASPERCREME) 10 % cream Apply 1 application topically 2 (two) times daily as needed for muscle pain.        No current facility-administered medications for this visit.   REVIEW OF SYSTEMS: Valu.Nieves ] denotes positive finding; [  ] denotes negative finding  CARDIOVASCULAR:  [ ]  chest pain   [ ]  chest pressure   [ ]  palpitations   [ ]  orthopnea   Valu.Nieves ] dyspnea on exertion   [ ]  claudication   [ ]  rest pain   [ ]  DVT   [ ]  phlebitis PULMONARY:   [ ]  productive cough   [ ]  asthma   [ ]  wheezing NEUROLOGIC:   [ ]  weakness  [ ]  paresthesias  [ ]  aphasia  [ ]  amaurosis  [ ]  dizziness HEMATOLOGIC:   [ ]  bleeding problems   [ ]  clotting disorders MUSCULOSKELETAL:  [ ]  joint pain   [ ]  joint swelling [ ]  leg swelling GASTROINTESTINAL: [ ]   blood in stool  [ ]   hematemesis GENITOURINARY:  [ ]   dysuria  [ ]   hematuria PSYCHIATRIC:  [ ]  history of major depression INTEGUMENTARY:  [ ]  rashes  [ ]  ulcers CONSTITUTIONAL:  [ ]  fever   [ ]  chills  PHYSICAL EXAM: Filed Vitals:   11/12/13 0927  BP: 172/57  Pulse: 54  Height: 5\' 10"  (1.778 m)  Weight: 202 lb 11.2 oz (91.944 kg)  SpO2: 97%   Body mass index is 29.08 kg/(m^2). GENERAL: The patient is a well-nourished male, in no acute distress. The vital signs are documented above. CARDIOVASCULAR: There is a regular rate and rhythm. He has bilateral carotid bruits. He has palpable femoral and popliteal pulses bilaterally. He has barely palpable dorsalis pedis pulses bilaterally. Both  feet appear adequately perfused. He has no significant lower  extremity swelling. PULMONARY: There is good air exchange bilaterally without wheezing or rales. ABDOMEN: Soft and non-tender with normal pitched bowel sounds.  MUSCULOSKELETAL: There are no major deformities or cyanosis. NEUROLOGIC: No focal weakness or paresthesias are detected. SKIN: There are no ulcers or rashes noted. PSYCHIATRIC: The patient has a normal affect.  DATA:  I have reviewed his vascular lab study done at the outside lab. This showed an ABI of 67% on the right and 72% on the left. Duplex suggested a 50-69% left common iliac artery stenosis and a 50-69% right superficial femoral artery stenosis.  MEDICAL ISSUES:  Peripheral vascular disease, unspecified This patient does have evidence of moderate peripheral vascular disease but is essentially asymptomatic. Although his duplex suggested a 50-69% left common iliac artery stenosis, he has a good femoral pulse on the left. Also, although the duplex suggested a 50-69% right SFA stenosis, he has a palpable right popliteal pulse. Given that he is essentially asymptomatic I would not recommend any further workup at this time and we will simply keep an eye on this when he comes in for his routine carotid studies. He is due to come back in September and I'll check his feet at that time, although he will not need follow up ABIs at that time. Fortunately he is not a smoker. Encouraged him to stay as active as possible. He'll call sooner if he has problems.    Return in about 4 months (around 03/15/2014).  Angelia Mould [facility] [place] Beeper: [phone]

## 2013-11-12 NOTE — Assessment & Plan Note (Signed)
This patient does have evidence of moderate peripheral vascular disease but is essentially asymptomatic. Although his duplex suggested a 50-69% left common iliac artery stenosis, he has a good femoral pulse on the left. Also, although the duplex suggested a 50-69% right SFA stenosis, he has a palpable right popliteal pulse. Given that he is essentially asymptomatic I would not recommend any further workup at this time and we will simply keep an eye on this when he comes in for his routine carotid studies. He is due to come back in September and I'll check his feet at that time, although he will not need follow up ABIs at that time. Fortunately he is not a smoker. Encouraged him to stay as active as possible. He'll call sooner if he has problems.

## 2013-11-13 DIAGNOSIS — Z6830 Body mass index (BMI) 30.0-30.9, adult: Secondary | ICD-10-CM | POA: Diagnosis not present

## 2013-11-13 DIAGNOSIS — N184 Chronic kidney disease, stage 4 (severe): Secondary | ICD-10-CM | POA: Diagnosis not present

## 2013-11-13 DIAGNOSIS — IMO0001 Reserved for inherently not codable concepts without codable children: Secondary | ICD-10-CM | POA: Diagnosis not present

## 2013-11-13 DIAGNOSIS — I10 Essential (primary) hypertension: Secondary | ICD-10-CM | POA: Diagnosis not present

## 2013-12-24 DIAGNOSIS — M109 Gout, unspecified: Secondary | ICD-10-CM | POA: Diagnosis not present

## 2013-12-24 DIAGNOSIS — N184 Chronic kidney disease, stage 4 (severe): Secondary | ICD-10-CM | POA: Diagnosis not present

## 2013-12-24 DIAGNOSIS — I770 Arteriovenous fistula, acquired: Secondary | ICD-10-CM | POA: Diagnosis not present

## 2013-12-24 DIAGNOSIS — I129 Hypertensive chronic kidney disease with stage 1 through stage 4 chronic kidney disease, or unspecified chronic kidney disease: Secondary | ICD-10-CM | POA: Diagnosis not present

## 2013-12-29 ENCOUNTER — Ambulatory Visit (INDEPENDENT_AMBULATORY_CARE_PROVIDER_SITE_OTHER): Payer: Medicare Other | Admitting: Podiatry

## 2013-12-29 ENCOUNTER — Encounter: Payer: Self-pay | Admitting: Podiatry

## 2013-12-29 VITALS — BP 108/54 | HR 51 | Resp 18

## 2013-12-29 DIAGNOSIS — B351 Tinea unguium: Secondary | ICD-10-CM

## 2013-12-29 DIAGNOSIS — M79673 Pain in unspecified foot: Secondary | ICD-10-CM

## 2013-12-29 DIAGNOSIS — M79609 Pain in unspecified limb: Secondary | ICD-10-CM

## 2013-12-29 NOTE — Progress Notes (Signed)
Patient ID: [name], male   DOB: [birth year], 80 y.o.   MRN: [id number]  Subjective: This patient presents complaining of painful toenails. He said vascular followup with Dr. Doren Custard after the visit of 09/30/2013.  Objective: The toenails are elongated, incurvated, hypertrophic and tender to palpation  Assessment: Symptomatic onychomycoses 6 through 10  Plan: Nails x10 are debrided without a bleeding  Reappoint at three-month intervals

## 2014-01-24 DIAGNOSIS — I4891 Unspecified atrial fibrillation: Secondary | ICD-10-CM

## 2014-01-24 HISTORY — DX: Unspecified atrial fibrillation: I48.91

## 2014-02-05 DIAGNOSIS — H43819 Vitreous degeneration, unspecified eye: Secondary | ICD-10-CM | POA: Diagnosis not present

## 2014-02-05 DIAGNOSIS — E11339 Type 2 diabetes mellitus with moderate nonproliferative diabetic retinopathy without macular edema: Secondary | ICD-10-CM | POA: Diagnosis not present

## 2014-02-05 DIAGNOSIS — H35369 Drusen (degenerative) of macula, unspecified eye: Secondary | ICD-10-CM | POA: Diagnosis not present

## 2014-02-05 DIAGNOSIS — E1139 Type 2 diabetes mellitus with other diabetic ophthalmic complication: Secondary | ICD-10-CM | POA: Diagnosis not present

## 2014-02-11 DIAGNOSIS — R0602 Shortness of breath: Secondary | ICD-10-CM | POA: Diagnosis not present

## 2014-02-11 DIAGNOSIS — E1159 Type 2 diabetes mellitus with other circulatory complications: Secondary | ICD-10-CM | POA: Diagnosis not present

## 2014-02-11 DIAGNOSIS — I4891 Unspecified atrial fibrillation: Secondary | ICD-10-CM | POA: Diagnosis not present

## 2014-02-11 DIAGNOSIS — N184 Chronic kidney disease, stage 4 (severe): Secondary | ICD-10-CM | POA: Diagnosis not present

## 2014-02-11 DIAGNOSIS — I739 Peripheral vascular disease, unspecified: Secondary | ICD-10-CM | POA: Diagnosis not present

## 2014-02-11 DIAGNOSIS — F0280 Dementia in other diseases classified elsewhere without behavioral disturbance: Secondary | ICD-10-CM | POA: Diagnosis not present

## 2014-02-11 DIAGNOSIS — G309 Alzheimer's disease, unspecified: Secondary | ICD-10-CM | POA: Diagnosis not present

## 2014-02-11 DIAGNOSIS — I2510 Atherosclerotic heart disease of native coronary artery without angina pectoris: Secondary | ICD-10-CM | POA: Diagnosis not present

## 2014-02-11 DIAGNOSIS — G4710 Hypersomnia, unspecified: Secondary | ICD-10-CM | POA: Diagnosis not present

## 2014-02-13 ENCOUNTER — Other Ambulatory Visit: Payer: Medicare Other

## 2014-02-13 ENCOUNTER — Ambulatory Visit (INDEPENDENT_AMBULATORY_CARE_PROVIDER_SITE_OTHER): Payer: Medicare Other | Admitting: Cardiovascular Disease

## 2014-02-13 ENCOUNTER — Encounter: Payer: Self-pay | Admitting: Cardiovascular Disease

## 2014-02-13 VITALS — BP 142/90 | HR 96 | Ht 70.0 in | Wt 206.4 lb

## 2014-02-13 DIAGNOSIS — I10 Essential (primary) hypertension: Secondary | ICD-10-CM | POA: Diagnosis not present

## 2014-02-13 DIAGNOSIS — I2589 Other forms of chronic ischemic heart disease: Secondary | ICD-10-CM | POA: Diagnosis not present

## 2014-02-13 DIAGNOSIS — E785 Hyperlipidemia, unspecified: Secondary | ICD-10-CM

## 2014-02-13 DIAGNOSIS — I4891 Unspecified atrial fibrillation: Secondary | ICD-10-CM

## 2014-02-13 DIAGNOSIS — I4819 Other persistent atrial fibrillation: Secondary | ICD-10-CM

## 2014-02-13 MED ORDER — METOPROLOL TARTRATE 50 MG PO TABS: 50.0000 mg | ORAL_TABLET | Freq: Two times a day (BID) | ORAL | Status: DC

## 2014-02-13 MED ORDER — METOPROLOL SUCCINATE ER 25 MG PO TB24: 25.0000 mg | ORAL_TABLET | Freq: Every day | ORAL | Status: DC

## 2014-02-13 MED ORDER — WARFARIN SODIUM 5 MG PO TABS: ORAL_TABLET | ORAL | Status: DC

## 2014-02-13 NOTE — Patient Instructions (Addendum)
Your physician has recommended you make the following change in your medication:  STOP Aspirin INCREASE Metoprolol to 50 mg twice daily START Coumadin - TAKE 5 mg (1 tablet) on Mon, Wed, Fri, Sat            TAKE 2.5 mg (1/2 tablet) on Tues, Thurs, Sunday  Your physician recommends that you schedule a follow-up appointment in: 1 week with Coumadin Clinic Follow-up with Dr. Acie Fredrickson in 3 months

## 2014-02-13 NOTE — Progress Notes (Signed)
Problem list:  1. Coronary artery disease 2. Cut chronic kidney disease 3. Atrophic leg  [name] Date of Birth  1933/10/15 [facility] [address]    [place], [place]  [address] [phone]  Fax  [phone]  History of Present Illness:  [name] is a 80 year old gentleman with a history of coronary artery disease and coronary artery bypass grafting. He recently presented with some right-sided shoulder pain that radiates through to his chest. He had a stress Myoview study which revealed a small inferolateral defect. He was set up for cardiac catheterization but his precath labs revealed a creatinine of 2.4 which was new. His baseline creatinine is between 1.6 and 1.8. We discontinued his HCTZ and his ACE inhibitor.  He was seen by his medical doctor who also stop his metformin.  We were going to do a cardiac catheterization this winter but we canceled the case because of his renal insufficiency. His metformin was stopped and he made some other medical changes in his creatinine has now improved. Fortunately, he's not having any further episodes of angina.  He informed me that his insurance company is no longer paying for Ranexa.  He's been bradycardic for years. We do not have him on beta blocker for that reason.  He's had some shoulder problems. He needs to have surgery on his right shoulder soon. He will eventually need to have surgery on his left shoulder. He had a low risk  Myoview study in November, 2012. He had an echocardiogram in August, 2013 that was also unchanged. He does not follow a strict diabetic diet.  His glucose levels went very high after his steroid injection ( > 500)  September 27, 2012:  Pt is doing well.  He denies any chest pain. Has some shoulder soreness.  He does have chronic dyspnea and has had worsening dyspnea since he had the flu in January.  He stays away from salt.  Nov. 13, 2014;  He contniues to have dyspnea an CP.  He did not  take a NTG.  He typically gets CP with exertion.   He is short of breath at rest and with exertion.  He seems to minimize his symptoms - wife thinks he has more shortness of breath that he admits to.    i was asked to see him again by Dr. Lorrene Reid for worsening dyspnea.  He avoids  salt.   Echo showed LVEF of 65-70% with LVH, .mild aortic stenosis  Feb. 13, 2015:  Pt is feeling well.  No angina.  Kidney function remained stable. His blood pressure is much better controlled during this visit compared to his last visit 3 months ago. He has been staying with her salt  February 13, 2014:    Current Outpatient Prescriptions on File Prior to Visit  Medication Sig Dispense Refill  . acetaminophen (TYLENOL) 500 MG tablet Take 1,000 mg by mouth 2 (two) times daily as needed for mild pain or moderate pain.       Marland Kitchen amLODipine (NORVASC) 10 MG tablet TAKE ONE TABLET BY MOUTH ONCE DAILY  30 tablet  6  . aspirin EC 325 MG tablet Take 325 mg by mouth daily.      Marland Kitchen atorvastatin (LIPITOR) 80 MG tablet Take 80 mg by mouth at bedtime.       . cholecalciferol (VITAMIN D) 1000 UNITS tablet Take 1,000 Units by mouth at bedtime.       . cloNIDine (CATAPRES) 0.2 MG tablet Take  0.2 mg by mouth 2 (two) times daily.      . clopidogrel (PLAVIX) 75 MG tablet Take 1 tablet (75 mg total) by mouth daily.  30 tablet  11  . fenofibrate 160 MG tablet TAKE ONE TABLET BY MOUTH ONCE DAILY  30 tablet  6  . fluticasone (FLONASE) 50 MCG/ACT nasal spray       . insulin aspart (NOVOLOG FLEXPEN) 100 UNIT/ML SOPN FlexPen Inject 15 Units into the skin 2 (two) times daily.       . Insulin Aspart Prot & Aspart (NOVOLOG MIX 70/30 FLEXPEN) (70-30) 100 UNIT/ML Pen Inject 15 Units into the skin 2 (two) times daily.      . Insulin Glargine (LANTUS SOLOSTAR) 100 UNIT/ML SOPN Inject 34 Units into the skin daily.       . isosorbide mononitrate (IMDUR) 60 MG 24 hr tablet Take 60 mg by mouth daily.      Marland Kitchen levothyroxine (SYNTHROID) 150 MCG tablet Take  75 mcg by mouth daily.   30 tablet  11  . metoprolol succinate (TOPROL-XL) 25 MG 24 hr tablet       . Multiple Vitamin (MULTIVITAMIN WITH MINERALS) TABS Take 1 tablet by mouth at bedtime.       . nitroGLYCERIN (NITROSTAT) 0.4 MG SL tablet Place 1 tablet (0.4 mg total) under the tongue every 5 (five) minutes as needed for chest pain.  25 tablet  3  . Polyethyl Glycol-Propyl Glycol (SYSTANE OP) Place 1 drop into both eyes daily as needed (dry eyes).      . polyethylene glycol powder (GLYCOLAX/MIRALAX) powder Take 1 Container by mouth daily as needed (constipation).       . ranitidine (ZANTAC) 300 MG tablet Take 300 mg by mouth 2 (two) times daily.   30 tablet  11  . trolamine salicylate (ASPERCREME) 10 % cream Apply 1 application topically 2 (two) times daily as needed for muscle pain.        No current facility-administered medications on file prior to visit.    Allergies  Allergen Reactions  . Betadine [Povidone Iodine] Rash  . Iodinated Diagnostic Agents Rash    Does fine with premedications for cath    Past Medical History  Diagnosis Date  . Hypertension   . Hyperlipidemia   . Thyroid disease   . CAD (coronary artery disease)     with prior CABG in 1997 with redo in 2000; last cath in 2008 - managed medically  . Type 2 diabetes mellitus      with fair control  . Hypothyroidism   . Generalized weakness     without overt findings  . Dehydration     Mild dehydration, resolved  . Peripheral vascular disease   . Carotid disease, bilateral     with multiple bilateral carotid surgeries  . Psoriasis   . Meniere's disease   . Degenerative joint disease   . Gout   . Orthopnea     Two-pillow  . CRI (chronic renal insufficiency)   . CHF (congestive heart failure)   . GERD (gastroesophageal reflux disease)     Past Surgical History  Procedure Laterality Date  . Coronary artery bypass graft  1997 and 2000  . Carotid endarterectomy    . Lung removal, partial    . Laminectomies   July 2001    lumbar laminectomies  . Thoracotomy       left--due to fungal infection  . Shoulder surgery    . Cardiac catheterization    .  Appendectomy    . Av fistula placement Left 01/21/2013    Procedure: ARTERIOVENOUS (AV) FISTULA CREATION, Left Brachiocephalic;  Surgeon: Angelia Mould, MD;  Location: [facility];  Service: Vascular;  Laterality: Left;    History  Smoking status  . Never Smoker   Smokeless tobacco  . Never Used    History  Alcohol Use No    Family History  Problem Relation Age of Onset  . Heart attack Father   . Heart disease Father   . Hyperlipidemia Father   . Hypertension Father   . Diabetes Mother   . Hypertension Mother   . Heart disease Brother   . Diabetes Brother     Reviw of Systems:  Reviewed in the HPI.  All other systems are negative.  Physical Exam: BP 142/90  Pulse 96  Ht 5\' 10"  (1.778 m)  Wt 206 lb 6.4 oz (93.622 kg)  BMI 29.62 kg/m2 The patient is alert and oriented x 3.  The mood and affect are normal.   Skin: warm and dry.  Color is normal.   HEENT:   Normocephalic/atraumatic. Mucous membranes are normal. Lungs: Lungs are clear.  Heart: Regular rate S1-S2. Has a 2/6 systolic ejection murmur cholesterol border.   Abdomen: His abdominal exam is benign. Extremities:  No clubbing cyanosis or edema Neuro:  Exam is nonfocal. Gait is normal.  ECG: 08/08/2013: Sinus bradycardia at 56. He has a right axis deviation. He has no ST or T wave changes. Assessment / Plan:

## 2014-02-13 NOTE — Assessment & Plan Note (Signed)
[name] presents today for new onset/new diagnosis of atrial fibrillation. He's basically asymptomatic although his wife thinks that he may be more short of breath over the past several months.  He appears to be fairly stable. His rate is a little fast.  His overall condition is fairly poor not think that we would do best by anticoagulation plus rate control. He's not a candidate for any of the antiarrhythmics except for amiodarone. He is stage IV chronic kidney disease and I do not think that Tikosyn would be advisable.  With given that he is relatively asymptomatic I think that he would do fine with rate control and anticoagulation.  At this point we'll start him on Coumadin 5 mg 4 days a week, 2.5 mg 3 days a week. We will enroll him in our Coumadin clinic. We will increase his metoprolol to 50 mg a day to help with his tachycardia. Will discontinue his aspirin. He'll continue on Plavix. He'll follow up with Coumadin clinic. I will see him  again in 3 months

## 2014-02-14 LAB — HEPATIC FUNCTION PANEL
ALBUMIN: 4 g/dL (ref 3.5–5.2)
ALK PHOS: 33 U/L — AB (ref 39–117)
ALT: 18 U/L (ref 0–53)
AST: 26 U/L (ref 0–37)
Bilirubin, Direct: 0.1 mg/dL (ref 0.0–0.3)
TOTAL PROTEIN: 6.5 g/dL (ref 6.0–8.3)
Total Bilirubin: 0.6 mg/dL (ref 0.2–1.2)

## 2014-02-14 LAB — BASIC METABOLIC PANEL
BUN: 44 mg/dL — ABNORMAL HIGH (ref 6–23)
CO2: 23 mEq/L (ref 19–32)
Calcium: 9.1 mg/dL (ref 8.4–10.5)
Chloride: 100 mEq/L (ref 96–112)
Creatinine, Ser: 2.6 mg/dL — ABNORMAL HIGH (ref 0.4–1.5)
GFR: 25.72 mL/min — ABNORMAL LOW (ref >60.00)
Glucose, Bld: 198 mg/dL — ABNORMAL HIGH (ref 70–99)
Potassium: 4.2 mEq/L (ref 3.5–5.1)
Sodium: 138 mEq/L (ref 135–145)

## 2014-02-14 LAB — LIPID PANEL
Cholesterol: 143 mg/dL (ref 0–200)
HDL: 33 mg/dL — ABNORMAL LOW (ref >39.00)
LDL Cholesterol: 87 mg/dL (ref 0–99)
NonHDL: 110
Total CHOL/HDL Ratio: 4
Triglycerides: 114 mg/dL (ref 0.0–149.0)
VLDL: 22.8 mg/dL (ref 0.0–40.0)

## 2014-02-19 ENCOUNTER — Ambulatory Visit (INDEPENDENT_AMBULATORY_CARE_PROVIDER_SITE_OTHER): Payer: Medicare Other | Admitting: Pharmacist

## 2014-02-19 DIAGNOSIS — I4819 Other persistent atrial fibrillation: Secondary | ICD-10-CM

## 2014-02-19 DIAGNOSIS — I4891 Unspecified atrial fibrillation: Secondary | ICD-10-CM

## 2014-02-19 DIAGNOSIS — Z5181 Encounter for therapeutic drug level monitoring: Secondary | ICD-10-CM | POA: Diagnosis not present

## 2014-02-19 LAB — POCT INR: INR: 2.1

## 2014-02-20 ENCOUNTER — Telehealth: Payer: Self-pay | Admitting: Cardiovascular Disease

## 2014-02-20 NOTE — Telephone Encounter (Signed)
Spoke with patient's wife who states patient is giving out of breath throughout the day even with very little activity.  Wife states she called Dr. Shon Baton office and they advised her to f/u with Dr. Acie Fredrickson.  Wife reports patient wears oxygen at night only; states patient says he doesn't need it during the day.  Wife states patient saw Dr. Philip Aspen last Wednesday and his A1C was 6 something.  States his blood sugar has been "good."  Wife reports she does not know what patient's heart rate and BP have been - states they do not measure it regularly.  I advised wife to keep record of HR and BP over the next few days and call back Monday to report and to also encourage patient to wear oxygen during the day for some time to see if his symptoms improve.  I advised wife that it is difficult to determine if patient's SOB is being caused by his a fib without knowing heart rate and also advised that medication increase was just made on Tuesday and patient may need more time to adjust to this increase.  I advised wife that with more information, Dr. Acie Fredrickson can determine plan.  Per Dr. Elmarie Shiley note from ov on 8/21, patient is not a candidate for many of the anti-arrhythmics.   Wife verbalized understanding and agreement to call back next week to report vital signs and how patient responds to using oxygen at times during the day.

## 2014-02-20 NOTE — Telephone Encounter (Signed)
°  Pt's wife was calling regarding pt's breathing. She doesn't feel that its anything urgent. Please call and advise.

## 2014-02-23 ENCOUNTER — Telehealth: Payer: Self-pay | Admitting: Cardiovascular Disease

## 2014-02-23 NOTE — Telephone Encounter (Signed)
Pt's wife spoke with Sharyn Lull on 8/28 about pts symptoms. She called today to let Sharyn Lull know about pt's HR an hour after taking the Metoprolol 25 mg. HR runs from 74 to 85 beats/ minute. Wife states when he takes a shower he gets exhausted. So She checked his HR   5 to 10 minutes after taking a shower his HR is 91 to 102 beats/minute. Pt's wife also states that   now pt uses his oxygen during the days as needed now and that helps a lot.

## 2014-02-23 NOTE — Telephone Encounter (Signed)
New message          Pt wife would like for you to give her a call / pt did not disclose any other info

## 2014-02-24 NOTE — Telephone Encounter (Signed)
These HR are normal. Unfortunately, he has numerous medical issues and unfortunately I suspect that he will continue to have problems.  I do not have any further specific recommendations.

## 2014-02-25 NOTE — Telephone Encounter (Signed)
Spoke with patient's wife who states patient is unable to do any activity without giving out of breath and he is in pain.  Wife put patient on the phone to explain his symptoms to me.  Patient states he has body soreness and his throat is sore.  Patient reports he is wearing the oxygen at night and a more during the day since I talked with his wife last week; uncertain as to whether this is helping him.  Patient states he gives out of breath if he walks to the carport and he has to sit down.  Patient states he feels pretty good when he wakes up but progressively feels worse through the day; denies chest pain - states sometimes he hurts on his right side.  Patient asked if he could put his wife back on the phone because "she understands these things better than me."  I again spoke with patient's wife and reviewed patient's medical history, including left thoracotomy in 1965 due to fungal infection.  Wife states she came into the family 12 years ago and so she is unaware of this history - states she will ask patient's son.  I advised that last note from pulmonology (Dr. Elsworth Soho) was in 2008.  I advised wife to call and make patient an appointment with pulmonology.  I advised that pain may be related to PVD or patient may have developed arthritis or other musculoskeletal problems that would need to be diagnosed by PCP; advised Dr. Acie Fredrickson does not have further recommendations.  Wife verbalized understanding and agreement and thanked me for the call.  I advised her to call back with questions or concerns.

## 2014-03-04 ENCOUNTER — Inpatient Hospital Stay (HOSPITAL_COMMUNITY)
Admission: EM | Admit: 2014-03-04 | Discharge: 2014-03-07 | DRG: 292 | Disposition: A | Discharge status: Home / Self Care | Payer: Medicare Other | Attending: Internal Medicine | Admitting: Internal Medicine

## 2014-03-04 ENCOUNTER — Emergency Department (HOSPITAL_COMMUNITY): Payer: Medicare Other

## 2014-03-04 ENCOUNTER — Encounter (HOSPITAL_COMMUNITY): Payer: Self-pay | Admitting: Emergency Medicine

## 2014-03-04 DIAGNOSIS — I5032 Chronic diastolic (congestive) heart failure: Secondary | ICD-10-CM

## 2014-03-04 DIAGNOSIS — I059 Rheumatic mitral valve disease, unspecified: Secondary | ICD-10-CM | POA: Diagnosis not present

## 2014-03-04 DIAGNOSIS — I509 Heart failure, unspecified: Secondary | ICD-10-CM | POA: Diagnosis present

## 2014-03-04 DIAGNOSIS — Z7901 Long term (current) use of anticoagulants: Secondary | ICD-10-CM | POA: Diagnosis not present

## 2014-03-04 DIAGNOSIS — Z794 Long term (current) use of insulin: Secondary | ICD-10-CM

## 2014-03-04 DIAGNOSIS — E119 Type 2 diabetes mellitus without complications: Secondary | ICD-10-CM | POA: Diagnosis present

## 2014-03-04 DIAGNOSIS — Z7902 Long term (current) use of antithrombotics/antiplatelets: Secondary | ICD-10-CM

## 2014-03-04 DIAGNOSIS — I5021 Acute systolic (congestive) heart failure: Secondary | ICD-10-CM | POA: Diagnosis not present

## 2014-03-04 DIAGNOSIS — Y9210 Unspecified residential institution as the place of occurrence of the external cause: Secondary | ICD-10-CM | POA: Diagnosis not present

## 2014-03-04 DIAGNOSIS — N184 Chronic kidney disease, stage 4 (severe): Secondary | ICD-10-CM | POA: Diagnosis present

## 2014-03-04 DIAGNOSIS — R0609 Other forms of dyspnea: Secondary | ICD-10-CM | POA: Diagnosis not present

## 2014-03-04 DIAGNOSIS — N186 End stage renal disease: Secondary | ICD-10-CM | POA: Diagnosis not present

## 2014-03-04 DIAGNOSIS — I5043 Acute on chronic combined systolic (congestive) and diastolic (congestive) heart failure: Principal | ICD-10-CM | POA: Diagnosis present

## 2014-03-04 DIAGNOSIS — R0602 Shortness of breath: Secondary | ICD-10-CM | POA: Diagnosis not present

## 2014-03-04 DIAGNOSIS — E785 Hyperlipidemia, unspecified: Secondary | ICD-10-CM | POA: Diagnosis present

## 2014-03-04 DIAGNOSIS — L408 Other psoriasis: Secondary | ICD-10-CM | POA: Diagnosis present

## 2014-03-04 DIAGNOSIS — I5031 Acute diastolic (congestive) heart failure: Secondary | ICD-10-CM | POA: Diagnosis present

## 2014-03-04 DIAGNOSIS — K219 Gastro-esophageal reflux disease without esophagitis: Secondary | ICD-10-CM | POA: Diagnosis present

## 2014-03-04 DIAGNOSIS — I4891 Unspecified atrial fibrillation: Secondary | ICD-10-CM | POA: Diagnosis present

## 2014-03-04 DIAGNOSIS — J9819 Other pulmonary collapse: Secondary | ICD-10-CM | POA: Diagnosis not present

## 2014-03-04 DIAGNOSIS — I480 Paroxysmal atrial fibrillation: Secondary | ICD-10-CM

## 2014-03-04 DIAGNOSIS — K761 Chronic passive congestion of liver: Secondary | ICD-10-CM | POA: Diagnosis present

## 2014-03-04 DIAGNOSIS — I498 Other specified cardiac arrhythmias: Secondary | ICD-10-CM | POA: Diagnosis not present

## 2014-03-04 DIAGNOSIS — R0989 Other specified symptoms and signs involving the circulatory and respiratory systems: Secondary | ICD-10-CM | POA: Diagnosis not present

## 2014-03-04 DIAGNOSIS — I428 Other cardiomyopathies: Secondary | ICD-10-CM | POA: Diagnosis present

## 2014-03-04 DIAGNOSIS — M109 Gout, unspecified: Secondary | ICD-10-CM | POA: Diagnosis present

## 2014-03-04 DIAGNOSIS — I359 Nonrheumatic aortic valve disorder, unspecified: Secondary | ICD-10-CM | POA: Diagnosis not present

## 2014-03-04 DIAGNOSIS — Z8249 Family history of ischemic heart disease and other diseases of the circulatory system: Secondary | ICD-10-CM

## 2014-03-04 DIAGNOSIS — Z91040 Latex allergy status: Secondary | ICD-10-CM

## 2014-03-04 DIAGNOSIS — T465X5A Adverse effect of other antihypertensive drugs, initial encounter: Secondary | ICD-10-CM | POA: Diagnosis not present

## 2014-03-04 DIAGNOSIS — J90 Pleural effusion, not elsewhere classified: Secondary | ICD-10-CM | POA: Diagnosis not present

## 2014-03-04 DIAGNOSIS — Z833 Family history of diabetes mellitus: Secondary | ICD-10-CM | POA: Diagnosis not present

## 2014-03-04 DIAGNOSIS — I739 Peripheral vascular disease, unspecified: Secondary | ICD-10-CM | POA: Diagnosis present

## 2014-03-04 DIAGNOSIS — N289 Disorder of kidney and ureter, unspecified: Secondary | ICD-10-CM

## 2014-03-04 DIAGNOSIS — I129 Hypertensive chronic kidney disease with stage 1 through stage 4 chronic kidney disease, or unspecified chronic kidney disease: Secondary | ICD-10-CM | POA: Diagnosis present

## 2014-03-04 DIAGNOSIS — R0600 Dyspnea, unspecified: Secondary | ICD-10-CM

## 2014-03-04 DIAGNOSIS — I10 Essential (primary) hypertension: Secondary | ICD-10-CM | POA: Diagnosis not present

## 2014-03-04 DIAGNOSIS — E039 Hypothyroidism, unspecified: Secondary | ICD-10-CM

## 2014-03-04 DIAGNOSIS — I4819 Other persistent atrial fibrillation: Secondary | ICD-10-CM

## 2014-03-04 LAB — BASIC METABOLIC PANEL
ANION GAP: 12 (ref 5–15)
BUN: 42 mg/dL — ABNORMAL HIGH (ref 6–23)
CHLORIDE: 96 meq/L (ref 96–112)
CO2: 33 mEq/L — ABNORMAL HIGH (ref 19–32)
Calcium: 8.9 mg/dL (ref 8.4–10.5)
Creatinine, Ser: 2.77 mg/dL — ABNORMAL HIGH (ref 0.50–1.35)
GFR calc Af Amer: 24 mL/min — ABNORMAL LOW (ref >90)
GFR calc non Af Amer: 20 mL/min — ABNORMAL LOW (ref >90)
Glucose, Bld: 108 mg/dL — ABNORMAL HIGH (ref 70–99)
POTASSIUM: 4.2 meq/L (ref 3.7–5.3)
SODIUM: 141 meq/L (ref 137–147)

## 2014-03-04 LAB — CBC WITH DIFFERENTIAL/PLATELET
BASOS ABS: 0 10*3/uL (ref 0.0–0.1)
BASOS PCT: 0 % (ref 0–1)
EOS ABS: 0.1 10*3/uL (ref 0.0–0.7)
Eosinophils Relative: 3 % (ref 0–5)
HCT: 43.5 % (ref 39.0–52.0)
Hemoglobin: 13.5 g/dL (ref 13.0–17.0)
Lymphocytes Relative: 20 % (ref 12–46)
Lymphs Abs: 0.8 10*3/uL (ref 0.7–4.0)
MCH: 29.5 pg (ref 26.0–34.0)
MCHC: 31 g/dL (ref 30.0–36.0)
MCV: 95 fL (ref 78.0–100.0)
Monocytes Absolute: 0.3 10*3/uL (ref 0.1–1.0)
Monocytes Relative: 6 % (ref 3–12)
NEUTROS ABS: 3 10*3/uL (ref 1.7–7.7)
NEUTROS PCT: 71 % (ref 43–77)
PLATELETS: 189 10*3/uL (ref 150–400)
RBC: 4.58 MIL/uL (ref 4.22–5.81)
RDW: 14.7 % (ref 11.5–15.5)
WBC: 4.2 10*3/uL (ref 4.0–10.5)

## 2014-03-04 LAB — GLUCOSE, CAPILLARY
GLUCOSE-CAPILLARY: 66 mg/dL — AB (ref 70–99)
GLUCOSE-CAPILLARY: 131 mg/dL — AB (ref 70–99)
Glucose-Capillary: 158 mg/dL — ABNORMAL HIGH (ref 70–99)

## 2014-03-04 LAB — TROPONIN I

## 2014-03-04 LAB — TSH: TSH: 61.71 u[IU]/mL — AB (ref 0.350–4.500)

## 2014-03-04 LAB — PROTIME-INR
INR: 4.92 — ABNORMAL HIGH (ref 0.00–1.49)
PROTHROMBIN TIME: 45.8 s — AB (ref 11.6–15.2)

## 2014-03-04 LAB — PRO B NATRIURETIC PEPTIDE: Pro B Natriuretic peptide (BNP): 4102 pg/mL — ABNORMAL HIGH (ref 0–450)

## 2014-03-04 MED ORDER — SODIUM CHLORIDE 0.9 % IJ SOLN
3.0000 mL | Freq: Two times a day (BID) | INTRAMUSCULAR | Status: DC
  Administered 2014-03-04 – 2014-03-06 (×4): 3 mL via INTRAVENOUS

## 2014-03-04 MED ORDER — INSULIN ASPART 100 UNIT/ML ~~LOC~~ SOLN
0.0000 [IU] | Freq: Three times a day (TID) | SUBCUTANEOUS | Status: DC
  Administered 2014-03-05 (×2): 3 [IU] via SUBCUTANEOUS
  Administered 2014-03-05 – 2014-03-06 (×2): 2 [IU] via SUBCUTANEOUS
  Administered 2014-03-07: 5 [IU] via SUBCUTANEOUS
  Administered 2014-03-07: 3 [IU] via SUBCUTANEOUS

## 2014-03-04 MED ORDER — ZOLPIDEM TARTRATE 5 MG PO TABS: 5.0000 mg | ORAL_TABLET | Freq: Every evening | ORAL | Status: DC | PRN

## 2014-03-04 MED ORDER — POLYETHYLENE GLYCOL 3350 17 G PO PACK
17.0000 g | PACK | Freq: Every day | ORAL | Status: DC | PRN
  Administered 2014-03-05: 17 g via ORAL
  Filled 2014-03-04 (×2): qty 1

## 2014-03-04 MED ORDER — CLONIDINE HCL 0.2 MG PO TABS
0.2000 mg | ORAL_TABLET | Freq: Two times a day (BID) | ORAL | Status: DC
  Administered 2014-03-04 – 2014-03-07 (×6): 0.2 mg via ORAL
  Filled 2014-03-04 (×7): qty 1

## 2014-03-04 MED ORDER — ATORVASTATIN CALCIUM 80 MG PO TABS
80.0000 mg | ORAL_TABLET | Freq: Every day | ORAL | Status: DC
  Administered 2014-03-04 – 2014-03-06 (×3): 80 mg via ORAL
  Filled 2014-03-04 (×4): qty 1

## 2014-03-04 MED ORDER — PNEUMOCOCCAL VAC POLYVALENT 25 MCG/0.5ML IJ INJ
0.5000 mL | INJECTION | INTRAMUSCULAR | Status: AC
  Administered 2014-03-05: 0.5 mL via INTRAMUSCULAR
  Filled 2014-03-04: qty 0.5

## 2014-03-04 MED ORDER — CLOPIDOGREL BISULFATE 75 MG PO TABS
75.0000 mg | ORAL_TABLET | Freq: Every day | ORAL | Status: DC
  Administered 2014-03-04 – 2014-03-07 (×4): 75 mg via ORAL
  Filled 2014-03-04 (×4): qty 1

## 2014-03-04 MED ORDER — AMLODIPINE BESYLATE 10 MG PO TABS
10.0000 mg | ORAL_TABLET | Freq: Every day | ORAL | Status: DC
  Administered 2014-03-04 – 2014-03-07 (×4): 10 mg via ORAL
  Filled 2014-03-04 (×4): qty 1

## 2014-03-04 MED ORDER — FAMOTIDINE 20 MG PO TABS
20.0000 mg | ORAL_TABLET | Freq: Two times a day (BID) | ORAL | Status: DC
  Administered 2014-03-04 – 2014-03-05 (×2): 20 mg via ORAL
  Filled 2014-03-04 (×3): qty 1

## 2014-03-04 MED ORDER — ADULT MULTIVITAMIN W/MINERALS CH
1.0000 | ORAL_TABLET | Freq: Every day | ORAL | Status: DC
  Administered 2014-03-04 – 2014-03-06 (×3): 1 via ORAL
  Filled 2014-03-04 (×4): qty 1

## 2014-03-04 MED ORDER — WARFARIN - PHARMACIST DOSING INPATIENT
Freq: Every day | Status: DC
  Administered 2014-03-06: 18:00:00

## 2014-03-04 MED ORDER — TROLAMINE SALICYLATE 10 % EX CREA
1.0000 "application " | TOPICAL_CREAM | Freq: Two times a day (BID) | CUTANEOUS | Status: DC | PRN
  Filled 2014-03-04: qty 85

## 2014-03-04 MED ORDER — SODIUM CHLORIDE 0.9 % IV SOLN: 250.0000 mL | INTRAVENOUS | Status: DC | PRN

## 2014-03-04 MED ORDER — INSULIN GLARGINE 100 UNIT/ML ~~LOC~~ SOLN
15.0000 [IU] | Freq: Every day | SUBCUTANEOUS | Status: DC
  Administered 2014-03-05 – 2014-03-07 (×2): 15 [IU] via SUBCUTANEOUS
  Filled 2014-03-04 (×4): qty 0.15

## 2014-03-04 MED ORDER — ACETAMINOPHEN 325 MG PO TABS: 650.0000 mg | ORAL_TABLET | ORAL | Status: DC | PRN

## 2014-03-04 MED ORDER — ACETAMINOPHEN 500 MG PO TABS: 1000.0000 mg | ORAL_TABLET | Freq: Two times a day (BID) | ORAL | Status: DC | PRN

## 2014-03-04 MED ORDER — MUSCLE RUB 10-15 % EX CREA
TOPICAL_CREAM | Freq: Two times a day (BID) | CUTANEOUS | Status: DC | PRN
  Filled 2014-03-04: qty 85

## 2014-03-04 MED ORDER — INSULIN GLARGINE 100 UNIT/ML SOLOSTAR PEN
15.0000 [IU] | PEN_INJECTOR | Freq: Every day | SUBCUTANEOUS | Status: DC
  Filled 2014-03-04: qty 3

## 2014-03-04 MED ORDER — INFLUENZA VAC SPLIT QUAD 0.5 ML IM SUSY
0.5000 mL | PREFILLED_SYRINGE | INTRAMUSCULAR | Status: AC
  Administered 2014-03-05: 0.5 mL via INTRAMUSCULAR
  Filled 2014-03-04: qty 0.5

## 2014-03-04 MED ORDER — FLUTICASONE PROPIONATE 50 MCG/ACT NA SUSP
2.0000 | Freq: Every day | NASAL | Status: DC | PRN
  Filled 2014-03-04: qty 16

## 2014-03-04 MED ORDER — SODIUM CHLORIDE 0.9 % IJ SOLN: 3.0000 mL | INTRAMUSCULAR | Status: DC | PRN

## 2014-03-04 MED ORDER — NITROGLYCERIN 0.4 MG SL SUBL
0.4000 mg | SUBLINGUAL_TABLET | SUBLINGUAL | Status: DC | PRN
Start: 2014-03-04 — End: 2014-03-07

## 2014-03-04 MED ORDER — ISOSORBIDE MONONITRATE ER 60 MG PO TB24
60.0000 mg | ORAL_TABLET | Freq: Every day | ORAL | Status: DC
  Administered 2014-03-05 – 2014-03-07 (×3): 60 mg via ORAL
  Filled 2014-03-04 (×3): qty 1

## 2014-03-04 MED ORDER — FUROSEMIDE 10 MG/ML IJ SOLN
80.0000 mg | Freq: Two times a day (BID) | INTRAMUSCULAR | Status: DC
  Administered 2014-03-04 – 2014-03-05 (×3): 80 mg via INTRAVENOUS
  Filled 2014-03-04 (×6): qty 8

## 2014-03-04 MED ORDER — ALPRAZOLAM 0.25 MG PO TABS: 0.2500 mg | ORAL_TABLET | Freq: Two times a day (BID) | ORAL | Status: DC | PRN

## 2014-03-04 MED ORDER — CETYLPYRIDINIUM CHLORIDE 0.05 % MT LIQD
7.0000 mL | Freq: Two times a day (BID) | OROMUCOSAL | Status: DC
  Administered 2014-03-04 – 2014-03-07 (×6): 7 mL via OROMUCOSAL

## 2014-03-04 MED ORDER — METOPROLOL SUCCINATE ER 25 MG PO TB24
25.0000 mg | ORAL_TABLET | Freq: Every day | ORAL | Status: DC
  Administered 2014-03-05 – 2014-03-06 (×2): 25 mg via ORAL
  Filled 2014-03-04 (×2): qty 1

## 2014-03-04 MED ORDER — ONDANSETRON HCL 4 MG/2ML IJ SOLN: 4.0000 mg | Freq: Four times a day (QID) | INTRAMUSCULAR | Status: DC | PRN

## 2014-03-04 MED ORDER — LEVOTHYROXINE SODIUM 75 MCG PO TABS
75.0000 ug | ORAL_TABLET | Freq: Every day | ORAL | Status: DC
  Administered 2014-03-05 – 2014-03-07 (×3): 75 ug via ORAL
  Filled 2014-03-04 (×5): qty 1

## 2014-03-04 MED ORDER — VITAMIN D3 25 MCG (1000 UNIT) PO TABS
1000.0000 [IU] | ORAL_TABLET | Freq: Every day | ORAL | Status: DC
  Administered 2014-03-04 – 2014-03-06 (×3): 1000 [IU] via ORAL
  Filled 2014-03-04 (×4): qty 1

## 2014-03-04 MED ORDER — FENOFIBRATE 160 MG PO TABS
160.0000 mg | ORAL_TABLET | Freq: Every day | ORAL | Status: DC
  Administered 2014-03-05 – 2014-03-07 (×3): 160 mg via ORAL
  Filled 2014-03-04 (×3): qty 1

## 2014-03-04 NOTE — ED Notes (Signed)
Cardiology at bedside.

## 2014-03-04 NOTE — H&P (Signed)
Pt. Seen and examined. Agree with the NP/PA-C note as written.  Pleasant 80 yo male patient of Dr. Acie Fredrickson, with history of diastolic dysfunction. Recently found to be in a-fib in the office and placed on warfarin. He has CKD4 with fistula placement in 2014. No discussion about possible cardioversion for a-fib. He has been progressively dyspneic over the past several weeks and presents with heart failure and pulmonary edema today. BNP is elevated at 4102, creatinine is acutely elevated at 2.77, from 2.6 in 01/2014. CXR shows increasing right basilar pleural effusion.   Would recommend admission for diuresis.  He may have worsening renal function with this and will need to be monitored closely for the possible need for dialysis. May wish to get nephrology involved early - he is followed by Dr. Lorrene Reid. Ultimately, he will likely benefit from the restoration of sinus rhythm. May wish to consider TEE cardioversion as he has warfarin for <1 month. It is likely that a-fib in the setting of diastolic dysfunction could be responsible for his acute decompensation.  Pixie Casino, MD, [facility] Attending Cardiologist [place]

## 2014-03-04 NOTE — H&P (Signed)
CARDIOLOGY HISTORY AND PHYSICAL   Patient ID: [name] MRN: [id number] DOB/AGE: [birth year] 80 y.o.  Admit date: 03/04/2014  Primary Physician   Donnajean Lopes, MD Primary Cardiologist   Dr. Acie Fredrickson Reason for Consultation   CHF  [name] is a 80 y.o. male with a history of CAD and CABG, HTN, HLD, DM, hypothyroid and CKD stage IV.  He had a fistula in 2014.   He was seen by Dr. Acie Fredrickson on 08/21 and diagnosed at that time with atrial fibrillation. He has a long history of bradycardia and has not been on a high dose of a beta blocker or rate lowering calcium channel blocker for that reason. He has tolerated Toprol-XL 25 mg daily in the past. When he was seen for the atrial fibrillation, the dose was increased to 50 mg daily and he was started on Coumadin. His aspirin was discontinued and he was to remain on daily Plavix.   He has had problems with hypoglycemia, and his family physician has been adjusting his insulin dose because of this. He has not been having problems with chest pain.  His renal physician is Dr. Lorrene Reid. He drinks very little water, mainly diet, caffeine-free, Mt. Dew. He is compliant with his medications and his wife does her best to keep him on a heart-healthy diet. However, his favorite breakfast is sausage and eggs and he eats other foods high in salt or fat.  He has had dyspnea on exertion for a long time. His activity level has been limited because of this. He was wearing oxygen at night but is now using it at times during the day.   Over the last 2 weeks, his dyspnea on exertion has worsened. The family has also noticed some lower extremity edema. He does not weigh himself daily. He has nocturia approximately 3 times per night. He is short of breath getting up to the bathroom or walking room to room. Today, he called Dr. Sanda Klein office and was told to come to the emergency room. In the emergency room, he has obvious volume overload.  Past Medical  History  Diagnosis Date  . Hypertension   . Hyperlipidemia   . Thyroid disease   . CAD (coronary artery disease)     with prior CABG in 1997 with redo in 2000; last cath in 2008 - managed medically  . Type 2 diabetes mellitus      with fair control  . Hypothyroidism   . Generalized weakness     without overt findings  . Dehydration     Mild dehydration, resolved  . Peripheral vascular disease   . Carotid disease, bilateral     with multiple bilateral carotid surgeries  . Psoriasis   . Meniere's disease   . Degenerative joint disease   . Gout   . Orthopnea     Two-pillow  . CRI (chronic renal insufficiency)   . CHF (congestive heart failure)   . GERD (gastroesophageal reflux disease)      Past Surgical History  Procedure Laterality Date  . Coronary artery bypass graft  1997 and 2000  . Carotid endarterectomy    . Lung removal, partial    . Laminectomies  July 2001    lumbar laminectomies  . Thoracotomy       left--due to fungal infection  . Shoulder surgery    . Cardiac catheterization    . Appendectomy    . Av fistula placement Left 01/21/2013  Procedure: ARTERIOVENOUS (AV) FISTULA CREATION, Left Brachiocephalic;  Surgeon: Angelia Mould, MD;  Location: [place];  Service: Vascular;  Laterality: Left;  . Cardiac catheterization  2008    L main irreg, LAD 80%, IMA-LAD & SVG-Diag patent, CFX 100%, SVG-OM 90%, RCA 70%, SVG-RCA OK, EF nl, med rx, no vessels appropriate for PCI    Allergies  Allergen Reactions  . Betadine [Povidone Iodine] Rash  . Iodinated Diagnostic Agents Rash and Other (See Comments)    Does fine with premedications for cath  . Latex Hives    I have reviewed the patient's current medications Medication Sig  acetaminophen (TYLENOL) 500 MG tablet Take 1,000 mg by mouth 2 (two) times daily as needed for mild pain or moderate pain.   amLODipine (NORVASC) 10 MG tablet Take 10 mg by mouth daily.  atorvastatin (LIPITOR) 80 MG tablet Take 80 mg  by mouth at bedtime.   cholecalciferol (VITAMIN D) 1000 UNITS tablet Take 1,000 Units by mouth daily at 6 PM.   cloNIDine (CATAPRES) 0.2 MG tablet Take 0.2 mg by mouth 2 (two) times daily.  clopidogrel (PLAVIX) 75 MG tablet Take 1 tablet (75 mg total) by mouth daily.  fenofibrate 160 MG tablet Take 160 mg by mouth daily.  fluticasone (FLONASE) 50 MCG/ACT nasal spray Place 2 sprays into both nostrils daily as needed for allergies.   furosemide (LASIX) 80 MG tablet Take 40 mg by mouth daily as needed for fluid.  insulin aspart (NOVOLOG FLEXPEN) 100 UNIT/ML SOPN FlexPen Inject 5 Units into the skin 2 (two) times daily.   Insulin Glargine (LANTUS SOLOSTAR) 100 UNIT/ML SOPN Inject 15 Units into the skin daily.   isosorbide mononitrate (IMDUR) 60 MG 24 hr tablet Take 60 mg by mouth daily.  levothyroxine (SYNTHROID) 150 MCG tablet Take 75 mcg by mouth daily.   metoprolol succinate (TOPROL XL) 25 MG 24 hr tablet Take 1 tablet (25 mg total) by mouth daily.  Multiple Vitamin (MULTIVITAMIN WITH MINERALS) TABS Take 1 tablet by mouth at bedtime.   nitroGLYCERIN (NITROSTAT) 0.4 MG SL tablet Place 1 tablet (0.4 mg total) under the tongue every 5 (five) minutes as needed for chest pain.  polyethylene glycol (MIRALAX / GLYCOLAX) packet Take 17 g by mouth daily as needed for moderate constipation.  ranitidine (ZANTAC) 300 MG tablet Take 300 mg by mouth 2 (two) times daily.   warfarin (COUMADIN) 5 MG tablet Take 2.5-5 mg by mouth daily. Take 2.5 mg by mouth on Monday, Wednesday and Friday. Take 5 mg by mouth on other days.  trolamine salicylate (ASPERCREME) 10 % cream Apply 1 application topically 2 (two) times daily as needed for muscle pain.      History   Social History  . Marital Status: Married    Spouse Name: N/A    Number of Children: N/A  . Years of Education: N/A   Occupational History  . Retired    Social History Main Topics  . Smoking status: Never Smoker   . Smokeless tobacco: Never Used    . Alcohol Use: No  . Drug Use: No  . Sexual Activity: Not Currently   Other Topics Concern  . Not on file   Social History Narrative  . No narrative on file    Family Status  Relation Status Death Age  . Father Deceased 36  . Mother Deceased 18  . Brother Deceased 40  . Sister Alive    Family History  Problem Relation Age of Onset  . Heart  attack Father   . Heart disease Father   . Hyperlipidemia Father   . Hypertension Father   . Diabetes Mother   . Hypertension Mother   . Heart disease Brother   . Diabetes Brother      ROS:  Full 14 point review of systems complete and found to be negative unless listed above.  Physical Exam: Blood pressure 151/83, pulse 69, temperature 97.5 F (36.4 C), temperature source Oral, resp. rate 25, SpO2 98.00%.  General: Well developed, well nourished, male who becomes short of breath with conversation. Head: Eyes PERRLA, No xanthomas.   Normocephalic and atraumatic, oropharynx without edema or exudate. Dentition: Poor Lungs: Decreased breath sounds bases with bronchial breath sounds Heart: Heart irregular rate and rhythm with S1, S2, no significant murmur. pulses are 2+ all 4 extrem.   Neck: No carotid bruits. No lymphadenopathy.  JVD/JVP elevated. Abdomen: Bowel sounds present, abdomen soft and non-tender without masses or hernias noted. Msk:  No spine or cva tenderness. No weakness, no joint deformities or effusions. Extremities: No clubbing or cyanosis. Pedal edema noted Neuro: Alert and oriented X 3. No focal deficits noted. Psych:  Good affect, responds appropriately  Skin: No rashes noted.   Labs:   Lab Results  Component Value Date   WBC 4.2 03/04/2014   HGB 13.5 03/04/2014   HCT 43.5 03/04/2014   MCV 95.0 03/04/2014   PLT 189 03/04/2014    Recent Labs  03/04/14 1143  INR 4.92*   L  Recent Labs Lab 03/04/14 1143  NA 141  K 4.2  CL 96  CO2 33*  BUN 42*  CREATININE 2.77*  CALCIUM 8.9  GLUCOSE 108*    Recent  Labs  03/04/14 1143  TROPONINI <0.30   Pro B Natriuretic peptide (BNP)  Date/Time Value Ref Range Status  03/04/2014 11:43 AM 4102.0* 0 - 450 pg/mL Final  02/29/2012  9:12 AM 59.0  0.0 - 100.0 pg/mL Final   Lab Results  Component Value Date   CHOL 143 02/13/2014   HDL 33.00* 02/13/2014   LDLCALC 87 02/13/2014   TRIG 114.0 02/13/2014   Echo: 04/11/2013 Study Conclusions - Left ventricle: The cavity size was normal. Wall thickness was increased in a pattern of mild LVH. Systolic function was vigorous. The estimated ejection fraction was in the range of 65% to 70%. Doppler parameters are consistent with abnormal left ventricular relaxation (grade 1 diastolic dysfunction). - Aortic valve: AV is thickened, calcified Peak and mean gradients through the valve are 28 and 15 mm Hg respectively consistent with mild AS. Mild regurgitation. - Mitral valve: Calcified annulus. Mildly thickened leaflets - Left atrium: The atrium was moderately dilated. - Pulmonary arteries: PA peak pressure: 14mm Hg (S).  ECG:  03/04/2014 Atrial fib Vent. rate 89 BPM PR interval * ms QRS duration 104 ms QT/QTc 370/450 ms P-R-T axes * 46 18  Cardiac Cath: 06/05/2007 ANGIOGRAPHY: Left main: The left main has minor coronary artery  irregularities.  The left anterior descending artery is a large vessel. There is an 80%  stenosis right at the first septal perforator. The insertion of the IMA  graft can be seen right at this point. The flow down the LAD is still  fairly good, and there are only minor luminal irregularities in the mid  and distal LAD.  The left circumflex artery is occluded.  The right coronary artery has a mid 40% stenosis followed by a 70%  stenosis. Competitive flow from the saphenous vein graft can  be seen.  The saphenous vein graft to the right coronary artery is a normal graft.  The anastomosis to the native right is normal, and the posterior  descending artery and posterolateral segment  artery are normal.  The saphenous vein graft to the obtuse marginal artery is a very tiny  graft. There is a 90% stenosis in the mid portion of the graft, and the  obtuse marginal system is a very tiny system with virtually no flow out  to the lateral wall.  The saphenous vein graft to the diagonal artery is a nice graft. The  anastomosis to the diagonal is normal and there is no significant  irregularity to the diagonal.  The left internal mammary artery is a small- to moderate-sized IMA. The  anastomosis to the LAD is normal. There is brisk flow down the LAD.  Competitive flow can be seen from the native LAD.  The left ventriculogram was performed via hand injection. It reveals  overall normal left ventricular systolic function.  COMPLICATIONS: None.  CONCLUSION: Severe native coronary artery disease but with patent  grafts to the LAD, first diagonal artery and right coronary artery. The  graft to the circumflex vessel has a tight stenosis, but the circumflex  distribution is a very small vessel and really does not supply much in  the way of myocardium. This is unchanged from his previous cath. We  will continue with medical therapy. There are really no sites for  intervention.  Radiology:  Dg Chest 2 View 03/04/2014   CLINICAL DATA:  Shortness of breath, history hypertension, hyperlipidemia, diabetes, coronary artery disease post CABG, CHF, chronic renal insufficiency, GERD  EXAM: CHEST  2 VIEW  COMPARISON:  03/28/2013  FINDINGS: Enlargement of cardiac silhouette post CABG.  Slight pulmonary vascular congestion.  Chronic accentuation of perihilar markings little changed.  Increased RIGHT pleural effusion and basilar atelectasis.  Tiny LEFT pleural effusion.  No definite acute pulmonary edema or segmental consolidation.  No pneumothorax.  Bones demineralized.  IMPRESSION: Enlargement of cardiac silhouette with slight pulmonary vascular congestion post CABG.  RIGHT basilar atelectasis and  increased pleural effusion.   Electronically Signed   By: Lavonia Dana M.D.   On: 03/04/2014 12:38    ASSESSMENT AND PLAN:   The patient was seen today by Dr. Debara Pickett, the patient evaluated and the data reviewed.  Principal Problem:   Acute systolic CHF (congestive heart failure), NYHA class 3 - he has obvious by him overloaded by exam. His weight is up but the amount is unclear, probably more than 10 pounds. He will be admitted and we will begin diuresis. He was taken off diuretics in the past because of renal insufficiency, however we will have to start IV Lasix for diuresis. Follow renal function, daily weight and electrolytes, closely, may need a renal consult. Last echo 03/2013. Recheck echo  Active Problems:   Atrial fibrillation - rate is controlled but may be contributing to his CHF. Check atrial size on echo, if not too large, may be a good candidate for TEE/DCCV.    Renal insufficiency - creatinine now is at/below baseline. He has an AV fistula in place. Call renal when necessary, but will start diuresis now.    Anticoagulation w/ coumadin - supratherapeutic now, but no bleeding issues, continue coumadin per pharmacy, but with care NOT to let level drop below therapeutic.  SignedRosaria Ferries, PA-C 03/04/2014 4:06 PM Beeper [phone]  Co-Sign MD

## 2014-03-04 NOTE — ED Notes (Signed)
Pt c/o increased SOB x months worse over last week; pt sts increased swelling in legs; pt with hx of CHF and labored at present

## 2014-03-04 NOTE — ED Provider Notes (Signed)
CSN: [id number]     Arrival date & time 03/04/14  1100 History   First MD Initiated Contact with Patient 03/04/14 1115     Chief Complaint  Patient presents with  . Shortness of Breath     (Consider location/radiation/quality/duration/timing/severity/associated sxs/prior Treatment) HPI  This is a 80 -year-old with a history of hypertension, hyperlipidemia, coronary artery disease who presents with shortness of breath. Patient reports worsening shortness of breath over the last one to 2 months. He states that he has seen his cardiologist and his nephrologist. He was recently started on Coumadin for atrial fibrillation. He denies any orthopnea but does endorse dyspnea on exertion. He uses 2 L of nasal cannula mostly at night. He denies any coughs or fever. No increasing leg swelling. Denies any chest pain.  Past Medical History  Diagnosis Date  . Hypertension   . Hyperlipidemia   . Thyroid disease   . CAD (coronary artery disease)     with prior CABG in 1997 with redo in 2000; last cath in 2008 - managed medically  . Type 2 diabetes mellitus      with fair control  . Hypothyroidism   . Generalized weakness     without overt findings  . Dehydration     Mild dehydration, resolved  . Peripheral vascular disease   . Carotid disease, bilateral     with multiple bilateral carotid surgeries  . Psoriasis   . Meniere's disease   . Degenerative joint disease   . Gout   . Orthopnea     Two-pillow  . CRI (chronic renal insufficiency)   . CHF (congestive heart failure)   . GERD (gastroesophageal reflux disease)    Past Surgical History  Procedure Laterality Date  . Coronary artery bypass graft  1997 and 2000  . Carotid endarterectomy    . Lung removal, partial    . Laminectomies  July 2001    lumbar laminectomies  . Thoracotomy       left--due to fungal infection  . Shoulder surgery    . Cardiac catheterization    . Appendectomy    . Av fistula placement Left 01/21/2013   Procedure: ARTERIOVENOUS (AV) FISTULA CREATION, Left Brachiocephalic;  Surgeon: Angelia Mould, MD;  Location: [place];  Service: Vascular;  Laterality: Left;  . Cardiac catheterization  2008    L main irreg, LAD 80%, IMA-LAD & SVG-Diag patent, CFX 100%, SVG-OM 90%, RCA 70%, SVG-RCA OK, EF nl, med rx, no vessels appropriate for PCI   Family History  Problem Relation Age of Onset  . Heart attack Father   . Heart disease Father   . Hyperlipidemia Father   . Hypertension Father   . Diabetes Mother   . Hypertension Mother   . Heart disease Brother   . Diabetes Brother    History  Substance Use Topics  . Smoking status: Never Smoker   . Smokeless tobacco: Never Used  . Alcohol Use: No    Review of Systems  Constitutional: Negative.  Negative for fever.  Respiratory: Positive for shortness of breath. Negative for cough, chest tightness and wheezing.   Cardiovascular: Negative.  Negative for chest pain and leg swelling.  Gastrointestinal: Negative.  Negative for nausea, vomiting and abdominal pain.  Genitourinary: Negative.  Negative for dysuria.  Musculoskeletal: Negative for back pain.  Neurological: Negative for headaches.  All other systems reviewed and are negative.     Allergies  Betadine; Iodinated diagnostic agents; and Latex  Home Medications  Prior to Admission medications   Medication Sig Start Date End Date Taking? Authorizing Provider  acetaminophen (TYLENOL) 500 MG tablet Take 1,000 mg by mouth 2 (two) times daily as needed for mild pain or moderate pain.    Yes Historical Provider, MD  amLODipine (NORVASC) 10 MG tablet Take 10 mg by mouth daily.   Yes Historical Provider, MD  atorvastatin (LIPITOR) 80 MG tablet Take 80 mg by mouth at bedtime.  05/07/13  Yes Thayer Headings, MD  cholecalciferol (VITAMIN D) 1000 UNITS tablet Take 1,000 Units by mouth daily at 6 PM.    Yes Historical Provider, MD  cloNIDine (CATAPRES) 0.2 MG tablet Take 0.2 mg by mouth 2 (two)  times daily. 05/08/13  Yes Thayer Headings, MD  clopidogrel (PLAVIX) 75 MG tablet Take 1 tablet (75 mg total) by mouth daily. 09/20/10  Yes Thayer Headings, MD  fenofibrate 160 MG tablet Take 160 mg by mouth daily.   Yes Historical Provider, MD  fluticasone (FLONASE) 50 MCG/ACT nasal spray Place 2 sprays into both nostrils daily as needed for allergies.  10/06/13  Yes Historical Provider, MD  furosemide (LASIX) 80 MG tablet Take 40 mg by mouth daily as needed for fluid.   Yes Historical Provider, MD  insulin aspart (NOVOLOG FLEXPEN) 100 UNIT/ML SOPN FlexPen Inject 5 Units into the skin 2 (two) times daily.    Yes Historical Provider, MD  Insulin Glargine (LANTUS SOLOSTAR) 100 UNIT/ML SOPN Inject 15 Units into the skin daily.    Yes Historical Provider, MD  isosorbide mononitrate (IMDUR) 60 MG 24 hr tablet Take 60 mg by mouth daily.   Yes Historical Provider, MD  levothyroxine (SYNTHROID) 150 MCG tablet Take 75 mcg by mouth daily.  09/20/10  Yes Thayer Headings, MD  metoprolol succinate (TOPROL XL) 25 MG 24 hr tablet Take 1 tablet (25 mg total) by mouth daily. 02/13/14  Yes Thayer Headings, MD  Multiple Vitamin (MULTIVITAMIN WITH MINERALS) TABS Take 1 tablet by mouth at bedtime.    Yes Historical Provider, MD  nitroGLYCERIN (NITROSTAT) 0.4 MG SL tablet Place 1 tablet (0.4 mg total) under the tongue every 5 (five) minutes as needed for chest pain. 05/09/13  Yes Thayer Headings, MD  polyethylene glycol [facility] / Floria Raveling) packet Take 17 g by mouth daily as needed for moderate constipation.   Yes Historical Provider, MD  ranitidine (ZANTAC) 300 MG tablet Take 300 mg by mouth 2 (two) times daily.  09/20/10  Yes Thayer Headings, MD  warfarin (COUMADIN) 5 MG tablet Take 2.5-5 mg by mouth daily. Take 2.5 mg by mouth on Monday, Wednesday and Friday. Take 5 mg by mouth on other days.   Yes Historical Provider, MD  trolamine salicylate (ASPERCREME) 10 % cream Apply 1 application topically 2 (two) times daily as  needed for muscle pain.     Historical Provider, MD   BP 151/83  Pulse 69  Temp(Src) 97.5 F (36.4 C) (Oral)  Resp 25  SpO2 98% Physical Exam  Nursing note and vitals reviewed. Constitutional: He is oriented to person, place, and time. He appears well-developed and well-nourished. No distress.  Elderly, chronically ill-appearing  HENT:  Head: Normocephalic and atraumatic.  Eyes: Pupils are equal, round, and reactive to light.  Neck: Neck supple. No JVD present.  Cardiovascular: Normal rate and normal heart sounds.   No murmur heard. Irregular rhythm  Pulmonary/Chest: Effort normal and breath sounds normal. No respiratory distress. He has no wheezes.  Crackles right  lower lobe with decreased breath sounds  Abdominal: Soft. Bowel sounds are normal. There is no tenderness. There is no rebound.  Musculoskeletal: He exhibits edema.  Trace to 1+ bilateral lower extremity edema  Lymphadenopathy:    He has no cervical adenopathy.  Neurological: He is alert and oriented to person, place, and time.  Skin: Skin is warm and dry.  Psychiatric: He has a normal mood and affect.    ED Course  Procedures (including critical care time) Labs Review Labs Reviewed  BASIC METABOLIC PANEL - Abnormal; Notable for the following:    CO2 33 (*)    Glucose, Bld 108 (*)    BUN 42 (*)    Creatinine, Ser 2.77 (*)    GFR calc non Af Amer 20 (*)    GFR calc Af Amer 24 (*)    All other components within normal limits  PRO B NATRIURETIC PEPTIDE - Abnormal; Notable for the following:    Pro B Natriuretic peptide (BNP) 4102.0 (*)    All other components within normal limits  PROTIME-INR - Abnormal; Notable for the following:    Prothrombin Time 45.8 (*)    INR 4.92 (*)    All other components within normal limits  CBC WITH DIFFERENTIAL  TROPONIN I    Imaging Review Dg Chest 2 View  03/04/2014   CLINICAL DATA:  Shortness of breath, history hypertension, hyperlipidemia, diabetes, coronary artery  disease post CABG, CHF, chronic renal insufficiency, GERD  EXAM: CHEST  2 VIEW  COMPARISON:  03/28/2013  FINDINGS: Enlargement of cardiac silhouette post CABG.  Slight pulmonary vascular congestion.  Chronic accentuation of perihilar markings little changed.  Increased RIGHT pleural effusion and basilar atelectasis.  Tiny LEFT pleural effusion.  No definite acute pulmonary edema or segmental consolidation.  No pneumothorax.  Bones demineralized.  IMPRESSION: Enlargement of cardiac silhouette with slight pulmonary vascular congestion post CABG.  RIGHT basilar atelectasis and increased pleural effusion.   Electronically Signed   By: Lavonia Dana M.D.   On: 03/04/2014 12:38     EKG Interpretation   Date/Time:  Wednesday March 04 2014 11:08:16 EDT Ventricular Rate:  89 PR Interval:    QRS Duration: 104 QT Interval:  370 QTC Calculation: 450 R Axis:   46 Text Interpretation:  Atrial fibrillation Anterior infarct , age  undetermined Abnormal ECG Now in atrial fibrillation Confirmed by Nowell Sites   MD, Loma Sousa ([id number]) on 03/04/2014 11:19:47 AM      MDM   Final diagnoses:  None    Patient presents with worsening shortness of breath. He is nontoxic on exam. He is on his baseline oxygen requirement. Mild lower extremity edema noted. He was recently diagnosed with atrial fibrillation and placed on Coumadin. Lab work obtained.  BMP at baseline. INR is elevated to 4.9. Likely needs adjustment in his Coumadin. BNP is elevated to 41 O2. Last EF 65-70%. Chest x-ray shows slight pulmonary vascular congestion as well as right basilar atelectasis and pleural effusion. Clinical picture suspicious for CHF in the setting of new atrial fibrillation. Will consult cardiology.    Merryl Hacker, MD 03/04/14 1600

## 2014-03-04 NOTE — Progress Notes (Signed)
Patient blood sugar was 132 after giving 4oz of juice around 1800. glucometers are down and not connecting to server.

## 2014-03-04 NOTE — Progress Notes (Signed)
ANTICOAGULATION CONSULT NOTE - Initial Consult  Pharmacy Consult for Warfarin Indication: atrial fibrillation  Allergies  Allergen Reactions  . Betadine [Povidone Iodine] Rash  . Iodinated Diagnostic Agents Rash and Other (See Comments)    Does fine with premedications for cath  . Latex Hives    Patient Measurements: Height: 5\' 10"  (177.8 cm) Weight: 206 lb 3.2 oz (93.532 kg) IBW/kg (Calculated) : 73  Vital Signs: Temp: 97.3 F (36.3 C) (09/09 1714) Temp src: Oral (09/09 1714) BP: 143/83 mmHg (09/09 1714) Pulse Rate: 73 (09/09 1714)  Labs:  Recent Labs  03/04/14 1143 03/04/14 1919  HGB 13.5  --   HCT 43.5  --   PLT 189  --   LABPROT 45.8*  --   INR 4.92*  --   CREATININE 2.77*  --   TROPONINI <0.30 <0.30    Estimated Creatinine Clearance: 25.2 ml/min (by C-G formula based on Cr of 2.77).   Medical History: Past Medical History  Diagnosis Date  . Hypertension   . Hyperlipidemia   . Thyroid disease   . CAD (coronary artery disease)     with prior CABG in 1997 with redo in 2000; last cath in 2008 - managed medically  . Type 2 diabetes mellitus      with fair control  . Hypothyroidism   . Generalized weakness     without overt findings  . Dehydration     Mild dehydration, resolved  . Peripheral vascular disease   . Carotid disease, bilateral     with multiple bilateral carotid surgeries  . Psoriasis   . Meniere's disease   . Degenerative joint disease   . Gout   . Orthopnea     Two-pillow  . CRI (chronic renal insufficiency)   . CHF (congestive heart failure)   . GERD (gastroesophageal reflux disease)     Medications:  Prescriptions prior to admission  Medication Sig Dispense Refill  . acetaminophen (TYLENOL) 500 MG tablet Take 1,000 mg by mouth 2 (two) times daily as needed for mild pain or moderate pain.       Marland Kitchen amLODipine (NORVASC) 10 MG tablet Take 10 mg by mouth daily.      Marland Kitchen atorvastatin (LIPITOR) 80 MG tablet Take 80 mg by mouth at  bedtime.       . cholecalciferol (VITAMIN D) 1000 UNITS tablet Take 1,000 Units by mouth daily at 6 PM.       . cloNIDine (CATAPRES) 0.2 MG tablet Take 0.2 mg by mouth 2 (two) times daily.      . clopidogrel (PLAVIX) 75 MG tablet Take 1 tablet (75 mg total) by mouth daily.  30 tablet  11  . fenofibrate 160 MG tablet Take 160 mg by mouth daily.      . fluticasone (FLONASE) 50 MCG/ACT nasal spray Place 2 sprays into both nostrils daily as needed for allergies.       . furosemide (LASIX) 80 MG tablet Take 40 mg by mouth daily as needed for fluid.      Marland Kitchen insulin aspart (NOVOLOG FLEXPEN) 100 UNIT/ML SOPN FlexPen Inject 5 Units into the skin 2 (two) times daily.       . Insulin Glargine (LANTUS SOLOSTAR) 100 UNIT/ML SOPN Inject 15 Units into the skin daily.       . isosorbide mononitrate (IMDUR) 60 MG 24 hr tablet Take 60 mg by mouth daily.      Marland Kitchen levothyroxine (SYNTHROID) 150 MCG tablet Take 75 mcg by mouth daily.  30 tablet  11  . metoprolol succinate (TOPROL XL) 25 MG 24 hr tablet Take 1 tablet (25 mg total) by mouth daily.  90 tablet  3  . Multiple Vitamin (MULTIVITAMIN WITH MINERALS) TABS Take 1 tablet by mouth at bedtime.       . nitroGLYCERIN (NITROSTAT) 0.4 MG SL tablet Place 1 tablet (0.4 mg total) under the tongue every 5 (five) minutes as needed for chest pain.  25 tablet  3  . polyethylene glycol (MIRALAX / GLYCOLAX) packet Take 17 g by mouth daily as needed for moderate constipation.      . ranitidine (ZANTAC) 300 MG tablet Take 300 mg by mouth 2 (two) times daily.   30 tablet  11  . warfarin (COUMADIN) 5 MG tablet Take 2.5-5 mg by mouth daily. Take 2.5 mg by mouth on Monday, Wednesday and Friday. Take 5 mg by mouth on other days.      Marland Kitchen trolamine salicylate (ASPERCREME) 10 % cream Apply 1 application topically 2 (two) times daily as needed for muscle pain.        Scheduled:  . amLODipine  10 mg Oral Daily  . antiseptic oral rinse  7 mL Mouth Rinse BID  . atorvastatin  80 mg Oral QHS   . cholecalciferol  1,000 Units Oral q1800  . cloNIDine  0.2 mg Oral BID  . clopidogrel  75 mg Oral Daily  . famotidine  20 mg Oral BID  . [START ON 03/05/2014] fenofibrate  160 mg Oral Daily  . furosemide  80 mg Intravenous BID  . [START ON 03/05/2014] Influenza vac split quadrivalent PF  0.5 mL Intramuscular Tomorrow-1000  . [START ON 03/05/2014] insulin aspart  0-15 Units Subcutaneous TID WC  . insulin glargine  15 Units Subcutaneous Daily  . [START ON 03/05/2014] isosorbide mononitrate  60 mg Oral Daily  . [START ON 03/05/2014] levothyroxine  75 mcg Oral QAC breakfast  . [START ON 03/05/2014] metoprolol succinate  25 mg Oral Daily  . multivitamin with minerals  1 tablet Oral QHS  . [START ON 03/05/2014] pneumococcal 23 valent vaccine  0.5 mL Intramuscular Tomorrow-1000  . sodium chloride  3 mL Intravenous Q12H   Assessment: 80yo male presents volume overloaded with supratherapeutic INR. Pharmacy consulted to dose warfarin for Afib. PTA warfarin regimen was 2.5mg  MWF and 5mg  all other days and was started in late August. INR on admission is 4.92, CBC is ok, sCr 2.77. Will need to lower weekly warfarin dosing.  Goal of Therapy:  INR 2-3 Monitor platelets by anticoagulation protocol: Yes   Plan:  Hold warfarin tonight x1 Daily INR/CBC Reduce weekly dose of warfarin  Andrey Cota. Diona Foley, PharmD Clinical Pharmacist Pager [phone] 03/04/2014,8:14 PM

## 2014-03-04 NOTE — Progress Notes (Signed)
Hypoglycemic Event  CBG: 66 Treatment: 15 GM carbohydrate snack  Symptoms: Hungry  Follow-up CBG: Time:1800 CBG Result:132 Possible Reasons for Event: Inadequate meal intake  Comments/MD notified: patient took lantus at home and did not eat lunch or dinner on schedule. glucometers are not working, all of them are not connecting to server.    [name] V  Remember to initiate Hypoglycemia Order Set & complete

## 2014-03-05 DIAGNOSIS — I359 Nonrheumatic aortic valve disorder, unspecified: Secondary | ICD-10-CM

## 2014-03-05 DIAGNOSIS — R0989 Other specified symptoms and signs involving the circulatory and respiratory systems: Secondary | ICD-10-CM

## 2014-03-05 DIAGNOSIS — R0609 Other forms of dyspnea: Secondary | ICD-10-CM

## 2014-03-05 DIAGNOSIS — I5043 Acute on chronic combined systolic (congestive) and diastolic (congestive) heart failure: Secondary | ICD-10-CM | POA: Diagnosis not present

## 2014-03-05 DIAGNOSIS — I5032 Chronic diastolic (congestive) heart failure: Secondary | ICD-10-CM

## 2014-03-05 DIAGNOSIS — I5031 Acute diastolic (congestive) heart failure: Secondary | ICD-10-CM

## 2014-03-05 DIAGNOSIS — E039 Hypothyroidism, unspecified: Secondary | ICD-10-CM

## 2014-03-05 DIAGNOSIS — I509 Heart failure, unspecified: Secondary | ICD-10-CM

## 2014-03-05 DIAGNOSIS — I10 Essential (primary) hypertension: Secondary | ICD-10-CM

## 2014-03-05 LAB — CBC
HEMATOCRIT: 41.3 % (ref 39.0–52.0)
Hemoglobin: 13.2 g/dL (ref 13.0–17.0)
MCH: 29.9 pg (ref 26.0–34.0)
MCHC: 32 g/dL (ref 30.0–36.0)
MCV: 93.7 fL (ref 78.0–100.0)
Platelets: 183 10*3/uL (ref 150–400)
RBC: 4.41 MIL/uL (ref 4.22–5.81)
RDW: 14.5 % (ref 11.5–15.5)
WBC: 5.4 10*3/uL (ref 4.0–10.5)

## 2014-03-05 LAB — GLUCOSE, CAPILLARY
GLUCOSE-CAPILLARY: 154 mg/dL — AB (ref 70–99)
GLUCOSE-CAPILLARY: 153 mg/dL — AB (ref 70–99)
Glucose-Capillary: 65 mg/dL — ABNORMAL LOW (ref 70–99)
Glucose-Capillary: 88 mg/dL (ref 70–99)
Glucose-Capillary: 127 mg/dL — ABNORMAL HIGH (ref 70–99)
Glucose-Capillary: 158 mg/dL — ABNORMAL HIGH (ref 70–99)

## 2014-03-05 LAB — COMPREHENSIVE METABOLIC PANEL
ALBUMIN: 3.4 g/dL — AB (ref 3.5–5.2)
ALT: 37 U/L (ref 0–53)
AST: 53 U/L — ABNORMAL HIGH (ref 0–37)
Alkaline Phosphatase: 39 U/L (ref 39–117)
Anion gap: 12 (ref 5–15)
BILIRUBIN TOTAL: 0.5 mg/dL (ref 0.3–1.2)
BUN: 45 mg/dL — ABNORMAL HIGH (ref 6–23)
CALCIUM: 9 mg/dL (ref 8.4–10.5)
CO2: 34 mEq/L — ABNORMAL HIGH (ref 19–32)
CREATININE: 2.59 mg/dL — AB (ref 0.50–1.35)
Chloride: 93 mEq/L — ABNORMAL LOW (ref 96–112)
GFR calc Af Amer: 26 mL/min — ABNORMAL LOW (ref >90)
GFR calc non Af Amer: 22 mL/min — ABNORMAL LOW (ref >90)
Glucose, Bld: 164 mg/dL — ABNORMAL HIGH (ref 70–99)
Potassium: 4 mEq/L (ref 3.7–5.3)
Sodium: 139 mEq/L (ref 137–147)
Total Protein: 5.9 g/dL — ABNORMAL LOW (ref 6.0–8.3)

## 2014-03-05 LAB — HEMOGLOBIN A1C
Hgb A1c MFr Bld: 7.3 % — ABNORMAL HIGH (ref <5.7)
Mean Plasma Glucose: 163 mg/dL — ABNORMAL HIGH (ref <117)

## 2014-03-05 LAB — TROPONIN I: Troponin I: <0.3 ng/mL (ref <0.30)

## 2014-03-05 LAB — PROTIME-INR
INR: 4.59 — ABNORMAL HIGH (ref 0.00–1.49)
Prothrombin Time: 43.4 seconds — ABNORMAL HIGH (ref 11.6–15.2)

## 2014-03-05 LAB — TSH: TSH: 55.5 u[IU]/mL — ABNORMAL HIGH (ref 0.350–4.500)

## 2014-03-05 LAB — T4, FREE: Free T4: 0.71 ng/dL — ABNORMAL LOW (ref 0.80–1.80)

## 2014-03-05 MED ORDER — FAMOTIDINE 20 MG PO TABS
20.0000 mg | ORAL_TABLET | Freq: Every day | ORAL | Status: DC
  Administered 2014-03-06 – 2014-03-07 (×2): 20 mg via ORAL
  Filled 2014-03-05 (×2): qty 1

## 2014-03-05 MED ORDER — MUPIROCIN CALCIUM 2 % EX CREA
TOPICAL_CREAM | Freq: Two times a day (BID) | CUTANEOUS | Status: DC
  Administered 2014-03-05 – 2014-03-07 (×5): via TOPICAL
  Filled 2014-03-05: qty 15

## 2014-03-05 NOTE — Progress Notes (Signed)
  Echocardiogram 2D Echocardiogram has been performed.  [name] 03/05/2014, 4:14 PM

## 2014-03-05 NOTE — Care Management Note (Addendum)
  Page 1 of 1   03/06/2014     4:52:14 PM CARE MANAGEMENT NOTE 03/06/2014  Patient:  [name], [name]   Account Number:  [id number]  Date Initiated:  03/05/2014  Documentation initiated by:  Lawonda Pretlow  Subjective/Objective Assessment:   CHF     Action/Plan:   CM to follow for disposition needs   Anticipated DC Date:  03/08/2014   Anticipated DC Plan:  HOME/SELF CARE         Choice offered to / List presented to:             Status of service:  Completed, signed off Medicare Important Message given?  YES (If response is "NO", the following Medicare IM given date fields will be blank) Date Medicare IM given:  03/06/2014 Medicare IM given by:  Hermenegildo Clausen Date Additional Medicare IM given:   Additional Medicare IM given by:    Discharge Disposition:  HOME/SELF CARE  Per UR Regulation:  Reviewed for med. necessity/level of care/duration of stay  If discussed at Long Length of Stay Meetings, dates discussed:    Comments:  Desiray Orchard RN, BSN, MSHL, CCM  Nurse - Case Manager,  ([facility] [place])  [phone]  03/05/2014 ADM:  CHF Wound Consult this admission re:  L Great Toe Social:  Home with family DME:  Home scales in place.  Patient and wife elect to self purchase shower chair since not covered by [facility] Disposition:  Home / self care. CM will continue to monitor for dispostiion needs

## 2014-03-05 NOTE — Progress Notes (Signed)
Patient Name: [name] Date of Encounter: 03/05/2014     Principal Problem:   Acute systolic CHF (congestive heart failure), NYHA class 3 Active Problems:   Renal insufficiency   Hyperlipidemia   Atrial fibrillation   Acute diastolic CHF (congestive heart failure), NYHA class 3    SUBJECTIVE  SOB improved. Denies any CP however has some R flank pain  CURRENT MEDS . amLODipine  10 mg Oral Daily  . antiseptic oral rinse  7 mL Mouth Rinse BID  . atorvastatin  80 mg Oral QHS  . cholecalciferol  1,000 Units Oral q1800  . cloNIDine  0.2 mg Oral BID  . clopidogrel  75 mg Oral Daily  . famotidine  20 mg Oral BID  . fenofibrate  160 mg Oral Daily  . furosemide  80 mg Intravenous BID  . Influenza vac split quadrivalent PF  0.5 mL Intramuscular Tomorrow-1000  . insulin aspart  0-15 Units Subcutaneous TID WC  . insulin glargine  15 Units Subcutaneous Daily  . isosorbide mononitrate  60 mg Oral Daily  . levothyroxine  75 mcg Oral QAC breakfast  . metoprolol succinate  25 mg Oral Daily  . multivitamin with minerals  1 tablet Oral QHS  . pneumococcal 23 valent vaccine  0.5 mL Intramuscular Tomorrow-1000  . sodium chloride  3 mL Intravenous Q12H  . Warfarin - Pharmacist Dosing Inpatient   Does not apply q1800    OBJECTIVE  Filed Vitals:   03/05/14 0623 03/05/14 0625 03/05/14 0632 03/05/14 0849  BP:   148/74 110/62  Pulse:   76 82  Temp:   98.3 F (36.8 C)   TempSrc:   Oral   Resp:   20   Height:      Weight: 197 lb 12 oz (89.7 kg) 198 lb 6.6 oz (90 kg)    SpO2:   98%     Intake/Output Summary (Last 24 hours) at 03/05/14 0855 Last data filed at 03/05/14 0841  Gross per 24 hour  Intake    720 ml  Output   3555 ml  Net  -2835 ml   Filed Weights   03/04/14 1725 03/05/14 0623 03/05/14 0625  Weight: 206 lb 3.2 oz (93.532 kg) 197 lb 12 oz (89.7 kg) 198 lb 6.6 oz (90 kg)    PHYSICAL EXAM  General: Pleasant, NAD. Neuro: Alert and oriented X 3. Moves all  extremities spontaneously. Psych: Normal affect. HEENT:  Normal  Neck: Supple without bruits. Mild JVD Lungs:  Resp regular and unlabored, markedly diminished breath sound in R bases which was the dependent side Heart: irregular no s3, s4. 1/6 systolic murmur Abdomen: Soft,  BS + x 4. Mildly distended, tenderness when palpate RUQ Extremities: No clubbing, cyanosis. DP/PT/Radials 2+ and equal bilaterally. 0-1+ edema in bilateral LE  Accessory Clinical Findings  CBC  Recent Labs  03/04/14 1143 03/05/14 0008  WBC 4.2 5.4  NEUTROABS 3.0  --   HGB 13.5 13.2  HCT 43.5 41.3  MCV 95.0 93.7  PLT 189 109   Basic Metabolic Panel  Recent Labs  03/04/14 1143 03/05/14 0008  NA 141 139  K 4.2 4.0  CL 96 93*  CO2 33* 34*  GLUCOSE 108* 164*  BUN 42* 45*  CREATININE 2.77* 2.59*  CALCIUM 8.9 9.0   Liver Function Tests  Recent Labs  03/05/14 0008  AST 53*  ALT 37  ALKPHOS 39  BILITOT 0.5  PROT 5.9*  ALBUMIN 3.4*   No results found  for this basename: LIPASE, AMYLASE,  in the last 72 hours Cardiac Enzymes  Recent Labs  03/04/14 1143 03/04/14 1919 03/04/14 2338  TROPONINI <0.30 <0.30 <0.30   Thyroid Function Tests  Recent Labs  03/04/14 1919  TSH 61.710*    TELE A-fib with HR 80s, no significant ventricular ectopy   ECG; A-fib with HR 80s  Echocardiogram  04/11/2014 LV EF: 65% - 70%  ------------------------------------------------------------ Indications: 786.05 Dyspnea.  ------------------------------------------------------------ History: PMH: Acquired from the patient and from the patient's chart. Dyspnea. Coronary artery disease. Risk factors: Hypertension. Diabetes mellitus. Dyslipidemia.  ------------------------------------------------------------ Study Conclusions  - Left ventricle: The cavity size was normal. Wall thickness was increased in a pattern of mild LVH. Systolic function was vigorous. The estimated ejection fraction was in  the range of 65% to 70%. Doppler parameters are consistent with abnormal left ventricular relaxation (grade 1 diastolic dysfunction). - Aortic valve: AV is thickened, calcified Peak and mean gradients through the valve are 28 and 15 mm Hg respectively consistent with mild AS. Mild regurgitation. - Mitral valve: Calcified annulus. Mildly thickened leaflets . - Left atrium: The atrium was moderately dilated. - Pulmonary arteries: PA peak pressure: 12mm Hg (S).      Radiology/Studies  Dg Chest 2 View  03/04/2014   CLINICAL DATA:  Shortness of breath, history hypertension, hyperlipidemia, diabetes, coronary artery disease post CABG, CHF, chronic renal insufficiency, GERD  EXAM: CHEST  2 VIEW  COMPARISON:  03/28/2013  FINDINGS: Enlargement of cardiac silhouette post CABG.  Slight pulmonary vascular congestion.  Chronic accentuation of perihilar markings little changed.  Increased RIGHT pleural effusion and basilar atelectasis.  Tiny LEFT pleural effusion.  No definite acute pulmonary edema or segmental consolidation.  No pneumothorax.  Bones demineralized.  IMPRESSION: Enlargement of cardiac silhouette with slight pulmonary vascular congestion post CABG.  RIGHT basilar atelectasis and increased pleural effusion.   Electronically Signed   By: Lavonia Dana M.D.   On: 03/04/2014 12:38    ASSESSMENT AND PLAN  1. Acute CHF, diastolic vs systolic  - pending echocardiogram to assess EF, last EF on echo in 03/2013 16-10%, grade 1 diastolic dysfunct  - -2.8 L, weight down from 206 to 198  previous on 80mg  PO lasix until it was discontinued Dec 2014 due to worsening renal function  - continue diuresis with 80mg  IV lasix BID for now, transition to 80mg  PO lasix daily tomorrow morning. Monitor renal function closely   2. Atrial fibrillation, newly diagnosed by Dr. Acie Fredrickson in August  - rate controlled, anticoagulation with coumadin  - would consider for TEE/DCCV based on LA size on today's echo  3.  Chronic renal insufficiency, stage IV   - Cr 2.59, stable  - follow Dr. Lorrene Reid, LUE AV fistula placed June 2014 in anticipation of future dialysis, has not used yet  4. Supratherapeutic INR  - manage per pharmacy  5. Significant elevated TSH of 61   - on synthroid  - will obtain a repeat TSH and free T4  - states he has been taking the same low dose synthroid dose for several yrs, dose may need adjustment base on free T4 level  6. R flank pain: tender when palpate RUQ, likely due to hepatic congestion  - AST mildly elevated, ALT normal  Signed, Woodward Ku Pager: [phone]   The patient was seen, examined and discussed with Almyra Deforest, PA-C and I agree with the above.   80 year old patient admitted with acute on chronic CHF with pLVEF, responded  well to iv Lasix 80 mg BID, -2.8 L and negative 8 lbs, still fluid overloaded, I would continue the same regimen for now. VKD stage 4, Crea improved overnight 2.77--> 2.59 (baseline 2.6-3.55).  Pending echo, we will consider DCCV for persistent a-fib first diagnosed in August 2015, on anticoagulation with coumadine.  High TSH, on synthroid 75 mcg daily, we will check free T4, increase slowly to 100 mcg daily considering CHF.  Dorothy Spark 03/05/2014

## 2014-03-05 NOTE — Progress Notes (Signed)
ANTICOAGULATION CONSULT NOTE - Follow Up Consult  Pharmacy Consult for warfarin Indication: atrial fibrillation  Allergies  Allergen Reactions  . Betadine [Povidone Iodine] Rash  . Iodinated Diagnostic Agents Rash and Other (See Comments)    Does fine with premedications for cath  . Latex Hives    Patient Measurements: Height: 5\' 10"  (177.8 cm) Weight: 198 lb 6.6 oz (90 kg) IBW/kg (Calculated) : 73  Vital Signs: Temp: 98.3 F (36.8 C) (09/10 0632) Temp src: Oral (09/10 0632) BP: 142/80 mmHg (09/10 1129) Pulse Rate: 82 (09/10 1132)  Labs:  Recent Labs  03/04/14 1143 03/04/14 1919 03/04/14 2338 03/05/14 0008 03/05/14 0710  HGB 13.5  --   --  13.2  --   HCT 43.5  --   --  41.3  --   PLT 189  --   --  183  --   LABPROT 45.8*  --   --  43.4*  --   INR 4.92*  --   --  4.59*  --   CREATININE 2.77*  --   --  2.59*  --   TROPONINI <0.30 <0.30 <0.30  --  <0.30    Estimated Creatinine Clearance: 26.5 ml/min (by C-G formula based on Cr of 2.59).   Assessment: 80 y/o male who presented to the ED on 9/9 with SOB and increased swelling in legs currently being treated for CHF exacerbation. He was started on warfarin in August for Afib. INR is SUPRAtherapeutic at 4.59 and down from yesterday. No bleeding noted, CBC is stable.  Goal of Therapy:  INR 2-3 Monitor platelets by anticoagulation protocol: Yes   Plan:  - No warfarin tonight - INR daily - Monitor for s/sx of bleeding  [facility], Pharm.D., BCPS Clinical Pharmacist Pager: [phone] 03/05/2014 1:39 PM

## 2014-03-05 NOTE — Consult Note (Addendum)
WOC wound consult note Reason for Consult: Consult requested for left great toe wound; full thickness.  Visible deformity to left toe. Wound type: Pt states this is a chronic wound for about 1 month Measurement: .8X.8X.1cm Wound bed: 20% red and moist, 80% yellow Drainage (amount, consistency, odor) scant amt yellow drainage, no odor Periwound:Intact skin surrounding Dressing procedure/placement/frequency: Bactroban to promote moist healing.  Foam dressing to protect from further injury.  Discussed plan of care with patient and wife.  They verbalize understanding and patient states he will follow-up with podiatrist after discharge. Please re-consult if further assistance is needed.  Thank-you,  Julien Girt MSN, [place], [place], [place], [place]

## 2014-03-05 NOTE — Progress Notes (Signed)
UR completed Corrado Hymon K. Jakerria Kingbird, RN, BSN, MSHL, CCM  03/05/2014 11:16 AM

## 2014-03-06 ENCOUNTER — Encounter (HOSPITAL_COMMUNITY)
Admission: EM | Disposition: A | Discharge status: Home / Self Care | Payer: Self-pay | Source: Home / Self Care | Attending: Internal Medicine

## 2014-03-06 ENCOUNTER — Institutional Professional Consult (permissible substitution): Payer: Medicare Other | Admitting: Emergency Medicine

## 2014-03-06 ENCOUNTER — Encounter (HOSPITAL_COMMUNITY): Payer: Medicare Other | Admitting: Anesthesiology

## 2014-03-06 ENCOUNTER — Inpatient Hospital Stay (HOSPITAL_COMMUNITY): Payer: Medicare Other | Admitting: Anesthesiology

## 2014-03-06 ENCOUNTER — Encounter (HOSPITAL_COMMUNITY): Payer: Self-pay | Admitting: *Deleted

## 2014-03-06 DIAGNOSIS — I059 Rheumatic mitral valve disease, unspecified: Secondary | ICD-10-CM

## 2014-03-06 DIAGNOSIS — I4891 Unspecified atrial fibrillation: Secondary | ICD-10-CM

## 2014-03-06 DIAGNOSIS — N289 Disorder of kidney and ureter, unspecified: Secondary | ICD-10-CM

## 2014-03-06 HISTORY — PX: CARDIOVERSION: SHX1299

## 2014-03-06 HISTORY — PX: TEE WITHOUT CARDIOVERSION: SHX5443

## 2014-03-06 LAB — GLUCOSE, CAPILLARY
GLUCOSE-CAPILLARY: 134 mg/dL — AB (ref 70–99)
Glucose-Capillary: 85 mg/dL (ref 70–99)
Glucose-Capillary: 104 mg/dL — ABNORMAL HIGH (ref 70–99)
Glucose-Capillary: 163 mg/dL — ABNORMAL HIGH (ref 70–99)

## 2014-03-06 LAB — BASIC METABOLIC PANEL
ANION GAP: 16 — AB (ref 5–15)
BUN: 51 mg/dL — ABNORMAL HIGH (ref 6–23)
CALCIUM: 9.5 mg/dL (ref 8.4–10.5)
CHLORIDE: 92 meq/L — AB (ref 96–112)
CO2: 33 meq/L — AB (ref 19–32)
Creatinine, Ser: 2.97 mg/dL — ABNORMAL HIGH (ref 0.50–1.35)
GFR calc Af Amer: 22 mL/min — ABNORMAL LOW (ref >90)
GFR calc non Af Amer: 19 mL/min — ABNORMAL LOW (ref >90)
Glucose, Bld: 114 mg/dL — ABNORMAL HIGH (ref 70–99)
POTASSIUM: 4 meq/L (ref 3.7–5.3)
SODIUM: 141 meq/L (ref 137–147)

## 2014-03-06 LAB — CBC
HCT: 43.2 % (ref 39.0–52.0)
Hemoglobin: 14.2 g/dL (ref 13.0–17.0)
MCH: 29.6 pg (ref 26.0–34.0)
MCHC: 32.9 g/dL (ref 30.0–36.0)
MCV: 90.2 fL (ref 78.0–100.0)
PLATELETS: 188 10*3/uL (ref 150–400)
RBC: 4.79 MIL/uL (ref 4.22–5.81)
RDW: 14.6 % (ref 11.5–15.5)
WBC: 5 10*3/uL (ref 4.0–10.5)

## 2014-03-06 LAB — PROTIME-INR
INR: 3.06 — AB (ref 0.00–1.49)
PROTHROMBIN TIME: 31.6 s — AB (ref 11.6–15.2)

## 2014-03-06 SURGERY — ECHOCARDIOGRAM, TRANSESOPHAGEAL
Anesthesia: Monitor Anesthesia Care

## 2014-03-06 MED ORDER — SODIUM CHLORIDE 0.9 % IJ SOLN: 3.0000 mL | INTRAMUSCULAR | Status: DC | PRN

## 2014-03-06 MED ORDER — SODIUM CHLORIDE 0.9 % IV SOLN
INTRAVENOUS | Status: DC
  Administered 2014-03-06: 250 mL via INTRAVENOUS

## 2014-03-06 MED ORDER — BUTAMBEN-TETRACAINE-BENZOCAINE 2-2-14 % EX AERO
INHALATION_SPRAY | CUTANEOUS | Status: DC | PRN
  Administered 2014-03-06: 2 via TOPICAL

## 2014-03-06 MED ORDER — PROPOFOL INFUSION 10 MG/ML OPTIME
INTRAVENOUS | Status: DC | PRN
  Administered 2014-03-06: 100 ug/kg/min via INTRAVENOUS

## 2014-03-06 MED ORDER — SODIUM CHLORIDE 0.9 % IV SOLN
250.0000 mL | INTRAVENOUS | Status: DC
  Administered 2014-03-06: 250 mL via INTRAVENOUS

## 2014-03-06 MED ORDER — PHENYLEPHRINE HCL 10 MG/ML IJ SOLN
INTRAMUSCULAR | Status: DC | PRN
  Administered 2014-03-06 (×2): 40 ug via INTRAVENOUS

## 2014-03-06 MED ORDER — AMIODARONE HCL 200 MG PO TABS
200.0000 mg | ORAL_TABLET | Freq: Two times a day (BID) | ORAL | Status: DC
  Administered 2014-03-06 – 2014-03-07 (×3): 200 mg via ORAL
  Filled 2014-03-06 (×4): qty 1

## 2014-03-06 MED ORDER — WARFARIN SODIUM 3 MG PO TABS
3.0000 mg | ORAL_TABLET | Freq: Once | ORAL | Status: AC
  Administered 2014-03-06: 3 mg via ORAL
  Filled 2014-03-06: qty 1

## 2014-03-06 MED ORDER — SODIUM CHLORIDE 0.9 % IJ SOLN
3.0000 mL | Freq: Two times a day (BID) | INTRAMUSCULAR | Status: DC
  Administered 2014-03-06: 3 mL via INTRAVENOUS

## 2014-03-06 NOTE — Progress Notes (Signed)
Pt tol. TEE/Cardioversion well...taking extended time to rouse from sedation. Pt has hypothyroidism, and was delayed in responses prior to sedation with flat affect, and some difficulty assimilating commands.

## 2014-03-06 NOTE — Interval H&P Note (Signed)
History and Physical Interval Note:  03/06/2014 1:14 PM  [name]  has presented today for surgery, with the diagnosis of a fib  The various methods of treatment have been discussed with the patient and family. After consideration of risks, benefits and other options for treatment, the patient has consented to  Procedure(s): TRANSESOPHAGEAL ECHOCARDIOGRAM (TEE) (N/A) CARDIOVERSION (N/A) as a surgical intervention .  The patient's history has been reviewed, patient examined, no change in status, stable for surgery.  I have reviewed the patient's chart and labs.  Questions were answered to the patient's satisfaction.     Kayleeann Huxford [employer]

## 2014-03-06 NOTE — H&P (View-Only) (Signed)
Patient Name: [name] Date of Encounter: 03/06/2014     Principal Problem:   Acute systolic CHF (congestive heart failure), NYHA class 3 Active Problems:   Renal insufficiency   Hyperlipidemia   Atrial fibrillation   Acute diastolic CHF (congestive heart failure), NYHA class 3    SUBJECTIVE  Denies any chest discomfort or SOB  CURRENT MEDS . amLODipine  10 mg Oral Daily  . antiseptic oral rinse  7 mL Mouth Rinse BID  . atorvastatin  80 mg Oral QHS  . cholecalciferol  1,000 Units Oral q1800  . cloNIDine  0.2 mg Oral BID  . clopidogrel  75 mg Oral Daily  . famotidine  20 mg Oral Daily  . fenofibrate  160 mg Oral Daily  . furosemide  80 mg Intravenous BID  . insulin aspart  0-15 Units Subcutaneous TID WC  . insulin glargine  15 Units Subcutaneous Daily  . isosorbide mononitrate  60 mg Oral Daily  . levothyroxine  75 mcg Oral QAC breakfast  . metoprolol succinate  25 mg Oral Daily  . multivitamin with minerals  1 tablet Oral QHS  . mupirocin cream   Topical BID  . sodium chloride  3 mL Intravenous Q12H  . Warfarin - Pharmacist Dosing Inpatient   Does not apply q1800    OBJECTIVE  Filed Vitals:   03/05/14 1425 03/05/14 1756 03/05/14 2059 03/06/14 0724  BP: 117/64 127/61 141/72 148/73  Pulse: 51 78 74 78  Temp: 97.7 F (36.5 C) 97.9 F (36.6 C) 98 F (36.7 C) 97.7 F (36.5 C)  TempSrc: Oral Oral Oral Oral  Resp:   20 18  Height:      Weight:    195 lb 5.2 oz (88.6 kg)  SpO2: 93% 92% 93% 94%    Intake/Output Summary (Last 24 hours) at 03/06/14 0803 Last data filed at 03/06/14 0700  Gross per 24 hour  Intake   1200 ml  Output   2025 ml  Net   -825 ml   Filed Weights   03/05/14 0623 03/05/14 0625 03/06/14 0724  Weight: 197 lb 12 oz (89.7 kg) 198 lb 6.6 oz (90 kg) 195 lb 5.2 oz (88.6 kg)    PHYSICAL EXAM  General: Pleasant, NAD. Neuro: Alert and oriented X 3. Moves all extremities spontaneously. Psych: Normal affect. HEENT:  Normal  Neck:  Supple without bruits or JVD. Lungs:  Resp regular and unlabored, diminished RLL breathing sound, otherwise CTA Heart: irregular no s3, s4, or murmurs. Abdomen: Soft, non-tender, non-distended, BS + x 4.  Extremities: No clubbing, cyanosis or edema. DP/PT/Radials 2+ and equal bilaterally.  Accessory Clinical Findings  CBC  Recent Labs  03/04/14 1143 03/05/14 0008 03/06/14 0354  WBC 4.2 5.4 5.0  NEUTROABS 3.0  --   --   HGB 13.5 13.2 14.2  HCT 43.5 41.3 43.2  MCV 95.0 93.7 90.2  PLT 189 183 381   Basic Metabolic Panel  Recent Labs  03/05/14 0008 03/06/14 0354  NA 139 141  K 4.0 4.0  CL 93* 92*  CO2 34* 33*  GLUCOSE 164* 114*  BUN 45* 51*  CREATININE 2.59* 2.97*  CALCIUM 9.0 9.5   Liver Function Tests  Recent Labs  03/05/14 0008  AST 53*  ALT 37  ALKPHOS 39  BILITOT 0.5  PROT 5.9*  ALBUMIN 3.4*   No results found for this basename: LIPASE, AMYLASE,  in the last 72 hours Cardiac Enzymes  Recent Labs  03/04/14 1919 03/04/14  2338 03/05/14 0710  TROPONINI <0.30 <0.30 <0.30   Hemoglobin A1C  Recent Labs  03/04/14 1919  HGBA1C 7.3*   Thyroid Function Tests  Recent Labs  03/05/14 1208  TSH 55.500*    TELE A-fib with HR 70-80s, no significant ventricular ectopy    ECG  A-fib with HR 80s  Echocardiogram  03/05/2014 LV EF: 45% - 50%  ------------------------------------------------------------------- Indications: CHF - 428.0.  ------------------------------------------------------------------- History: PMH: Renal disease. Dyspnea. Atrial fibrillation. Risk factors: Hypertension. Diabetes mellitus. Dyslipidemia.  ------------------------------------------------------------------- Study Conclusions  - Left ventricle: The cavity size was normal. Wall thickness was increased in a pattern of moderate LVH. Systolic function was mildly reduced. The estimated ejection fraction was in the range of 45% to 50%. Diffuse hypokinesis. - Aortic  valve: Valve mobility was restricted. There was moderate stenosis. There was mild regurgitation. Valve area (VTI): 1.01 cm^2. Valve area (Vmax): 1 cm^2. Valve area (Vmean): 0.96 cm^2. - Mitral valve: Calcified annulus. - Left atrium: The atrium was moderately dilated. - Right ventricle: The cavity size was mildly dilated. - Right atrium: The atrium was mildly dilated.  Impressions:  - Technically difficult; LV function appears to be mildly reduced with EF approximately 45; calcified aortic valve with reduced cusp excursion; probable moderate AS (mean gradient 11 mmHg but AVA by continuity equation 1 cm2); mild AI; biatrial enlargement; mild RVE.     Radiology/Studies  Dg Chest 2 View  03/04/2014   CLINICAL DATA:  Shortness of breath, history hypertension, hyperlipidemia, diabetes, coronary artery disease post CABG, CHF, chronic renal insufficiency, GERD  EXAM: CHEST  2 VIEW  COMPARISON:  03/28/2013  FINDINGS: Enlargement of cardiac silhouette post CABG.  Slight pulmonary vascular congestion.  Chronic accentuation of perihilar markings little changed.  Increased RIGHT pleural effusion and basilar atelectasis.  Tiny LEFT pleural effusion.  No definite acute pulmonary edema or segmental consolidation.  No pneumothorax.  Bones demineralized.  IMPRESSION: Enlargement of cardiac silhouette with slight pulmonary vascular congestion post CABG.  RIGHT basilar atelectasis and increased pleural effusion.   Electronically Signed   By: Lavonia Dana M.D.   On: 03/04/2014 12:38    ASSESSMENT AND PLAN  1. Acute on chronic systolic and diastolic heart failure  - EF on echo in 03/2013 09-38%, grade 1 diastolic dysfunct  - Echo 03/05/2014 EF 45-50%, diffuse hypokinesis, moderate LVH, moderate AS, moderate LA  - previous on 80mg  PO lasix until it was discontinued Dec 2014 due to worsening renal function   - -3.9L, weight down from 206 to 195lbs  - D/C IV lasix, Cr 2.59 --> 2.97, will allow kidney to recover  today, and start PO lasix 80mg  daily tomorrow  2. Atrial fibrillation, newly diagnosed by Dr. Acie Fredrickson in August   - rate controlled, anticoagulation with coumadin   - moderately dilated LA with moderate AS, may still be a candidate for TEE/Cardioversion, also discussed with coumadin x 3wks then cardioversion w/o TEE with family, family will make a decision. Remain NPO  3. Chronic renal insufficiency, stage IV   - Cr 2.59 --> 2.97  - follow Dr. Lorrene Reid, LUE AV fistula placed June 2014 in anticipation of future dialysis, has not used yet   4. Supratherapeutic INR   - manage per pharmacy   5. Significant elevated TSH of 61   - on synthroid   - repeat lab TSH 55.5, free T4 0.71  - states he has been taking the same low dose synthroid dose for several yrs, need to adjust synthroid dose  6. R flank pain: tender when palpate RUQ, likely due to hepatic congestion   - AST mildly elevated, ALT normal   Signed, Woodward Ku Pager: [phone]  Patient seen and examined with Almyra Deforest, PA-C. We discussed all aspects of the encounter. I agree with the assessment and plan as stated above.   He is much improved with diuresis. Renal function now starting to bump (baseline 2.6). Agree with holding lasix. Suspect HF is due to AF. Echo with EF ~50% with moderate AS. Doubt he will tolerate AF well. I discussed TEE/DC-CV with him and family and we will proceed today. LA is almost 5cm. So will start amio to help prevent recurrence. 400 bid x 1 week. Then 200 bid. Continue coumadin. Will need PT.   Hopefully home tomorrow after DC-CV if renal function stable.   Daniel Bensimhon,MD 9:17 AM

## 2014-03-06 NOTE — Discharge Instructions (Addendum)
Information on my medicine - Coumadin   (Warfarin)  This medication education was reviewed with me or my healthcare representative as part of my discharge preparation.  The pharmacist that spoke with me during my hospital stay was:  Jaquita Folds, [facility]  Why was Coumadin prescribed for you? Coumadin was prescribed for you because you have a blood clot or a medical condition that can cause an increased risk of forming blood clots. Blood clots can cause serious health problems by blocking the flow of blood to the heart, lung, or brain. Coumadin can prevent harmful blood clots from forming. As a reminder your indication for Coumadin is:   Stroke Prevention Because Of Atrial FibrillationAmiodarone tablets What is this medicine? AMIODARONE (a MEE oh da rone) is an antiarrhythmic drug. It helps make your heart beat regularly. Because of the side effects caused by this medicine, it is only used when other medicines have not worked. It is usually used for heartbeat problems that may be life threatening. This medicine may be used for other purposes; ask your health care provider or pharmacist if you have questions. COMMON BRAND NAME(S): Cordarone, Pacerone What should I tell my health care provider before I take this medicine? They need to know if you have any of these conditions: -liver disease -lung disease -other heart problems -thyroid disease -an unusual or allergic reaction to amiodarone, iodine, other medicines, foods, dyes, or preservatives -pregnant or trying to get pregnant -breast-feeding How should I use this medicine? Take this medicine by mouth with a glass of water. Follow the directions on the prescription label. You can take this medicine with or without food. However, you should always take it the same way each time. Take your doses at regular intervals. Do not take your medicine more often than directed. Do not stop taking except on the advice of your doctor or health care  professional. A special MedGuide will be given to you by the pharmacist with each prescription and refill. Be sure to read this information carefully each time. Talk to your pediatrician regarding the use of this medicine in children. Special care may be needed. Overdosage: If you think you have taken too much of this medicine contact a poison control center or emergency room at once. NOTE: This medicine is only for you. Do not share this medicine with others. What if I miss a dose? If you miss a dose, take it as soon as you can. If it is almost time for your next dose, take only that dose. Do not take double or extra doses. What may interact with this medicine? Do not take this medicine with any of the following medications: -abarelix -apomorphine -arsenic trioxide -certain antibiotics like erythromycin, gemifloxacin, levofloxacin, pentamidine -certain medicines for depression like amoxapine, tricyclic antidepressants -certain medicines for fungal infections like fluconazole, itraconazole, ketoconazole, posaconazole, voriconazole -certain medicines for irregular heart beat like disopyramide, dofetilide, dronedarone, ibutilide, propafenone, sotalol -certain medicines for malaria like chloroquine, halofantrine -cisapride -droperidol -haloperidol -hawthorn -maprotiline -methadone -phenothiazines like chlorpromazine, mesoridazine, thioridazine -pimozide -ranolazine -red yeast rice -vardenafil -ziprasidone This medicine may also interact with the following medications: -antiviral medicines for HIV or AIDS -certain medicines for blood pressure, heart disease, irregular heart beat -certain medicines for cholesterol like atorvastatin, cerivastatin, lovastatin, simvastatin -certain medicines for hepatitis C like sofosbuvir and ledipasvir; sofosbuvir -certain medicines for seizures like phenytoin -certain medicines for thyroid problems -certain medicines that treat or prevent blood clots  like warfarin -cholestyramine -cimetidine -clopidogrel -cyclosporine -dextromethorphan -diuretics -fentanyl -general anesthetics -  grapefruit juice -lidocaine -loratadine -methotrexate -other medicines that prolong the QT interval (cause an abnormal heart rhythm) -procainamide -quinidine -rifabutin, rifampin, or rifapentine -St. John's Wort -trazodone This list may not describe all possible interactions. Give your health care provider a list of all the medicines, herbs, non-prescription drugs, or dietary supplements you use. Also tell them if you smoke, drink alcohol, or use illegal drugs. Some items may interact with your medicine. What should I watch for while using this medicine? Your condition will be monitored closely when you first begin therapy. Often, this drug is first started in a hospital or other monitored health care setting. Once you are on maintenance therapy, visit your doctor or health care professional for regular checks on your progress. Because your condition and use of this medicine carry some risk, it is a good idea to carry an identification card, necklace or bracelet with details of your condition, medications, and doctor or health care professional. Dennis Bast may get drowsy or dizzy. Do not drive, use machinery, or do anything that needs mental alertness until you know how this medicine affects you. Do not stand or sit up quickly, especially if you are an older patient. This reduces the risk of dizzy or fainting spells. This medicine can make you more sensitive to the sun. Keep out of the sun. If you cannot avoid being in the sun, wear protective clothing and use sunscreen. Do not use sun lamps or tanning beds/booths. You should have regular eye exams before and during treatment. Call your doctor if you have blurred vision, see halos, or your eyes become sensitive to light. Your eyes may get dry. It may be helpful to use a lubricating eye solution or artificial tears  solution. If you are going to have surgery or a procedure that requires contrast dyes, tell your doctor or health care professional that you are taking this medicine. What side effects may I notice from receiving this medicine? Side effects that you should report to your doctor or health care professional as soon as possible: -allergic reactions like skin rash, itching or hives, swelling of the face, lips, or tongue -blue-gray coloring of the skin -blurred vision, seeing blue green halos, increased sensitivity of the eyes to light -breathing problems -chest pain -dark urine -fast, irregular heartbeat -feeling faint or light-headed -intolerance to heat or cold -nausea or vomiting -pain and swelling of the scrotum -pain, tingling, numbness in feet, hands -redness, blistering, peeling or loosening of the skin, including inside the mouth -spitting up blood -stomach pain -sweating -unusual or uncontrolled movements of body -unusually weak or tired -weight gain or loss -yellowing of the eyes or skin Side effects that usually do not require medical attention (report to your doctor or health care professional if they continue or are bothersome): -change in sex drive or performance -constipation -dizziness -headache -loss of appetite -trouble sleeping This list may not describe all possible side effects. Call your doctor for medical advice about side effects. You may report side effects to FDA at 1-800-FDA-1088. Where should I keep my medicine? Keep out of the reach of children. Store at room temperature between 20 and 25 degrees C (68 and 77 degrees F). Protect from light. Keep container tightly closed. Throw away any unused medicine after the expiration date. NOTE: This sheet is a summary. It may not cover all possible information. If you have questions about this medicine, talk to your doctor, pharmacist, or health care provider.  2015, Elsevier/Gold Standard. (2013-09-15  19:48:11) Atrial Fibrillation  Atrial fibrillation is a type of irregular heart rhythm (arrhythmia). During atrial fibrillation, the upper chambers of the heart (atria) quiver continuously in a chaotic pattern. This causes an irregular and often rapid heart rate.  Atrial fibrillation is the result of the heart becoming overloaded with disorganized signals that tell it to beat. These signals are normally released one at a time by a part of the right atrium called the sinoatrial node. They then travel from the atria to the lower chambers of the heart (ventricles), causing the atria and ventricles to contract and pump blood as they pass. In atrial fibrillation, parts of the atria outside of the sinoatrial node also release these signals. This results in two problems. First, the atria receive so many signals that they do not have time to fully contract. Second, the ventricles, which can only receive one signal at a time, beat irregularly and out of rhythm with the atria.  There are three types of atrial fibrillation:   Paroxysmal. Paroxysmal atrial fibrillation starts suddenly and stops on its own within a week.  Persistent. Persistent atrial fibrillation lasts for more than a week. It may stop on its own or with treatment.  Permanent. Permanent atrial fibrillation does not go away. Episodes of atrial fibrillation may lead to permanent atrial fibrillation. Atrial fibrillation can prevent your heart from pumping blood normally. It increases your risk of stroke and can lead to heart failure.  CAUSES   Heart conditions, including a heart attack, heart failure, coronary artery disease, and heart valve conditions.   Inflammation of the sac that surrounds the heart (pericarditis).  Blockage of an artery in the lungs (pulmonary embolism).  Pneumonia or other infections.  Chronic lung disease.  Thyroid problems, especially if the thyroid is overactive (hyperthyroidism).  Caffeine, excessive alcohol  use, and use of some illegal drugs.   Use of some medicines, including certain decongestants and diet pills.  Heart surgery.   Birth defects.  Sometimes, no cause can be found. When this happens, the atrial fibrillation is called lone atrial fibrillation. The risk of complications from atrial fibrillation increases if you have lone atrial fibrillation and you are age 52 years or older. RISK FACTORS  Heart failure.  Coronary artery disease.  Diabetes mellitus.   High blood pressure (hypertension).   Obesity.   Other arrhythmias.   Increased age. SIGNS AND SYMPTOMS   A feeling that your heart is beating rapidly or irregularly.   A feeling of discomfort or pain in your chest.   Shortness of breath.   Sudden light-headedness or weakness.   Getting tired easily when exercising.   Urinating more often than normal (mainly when atrial fibrillation first begins).  In paroxysmal atrial fibrillation, symptoms may start and suddenly stop. DIAGNOSIS  Your health care provider may be able to detect atrial fibrillation when taking your pulse. Your health care provider may have you take a test called an ambulatory electrocardiogram (ECG). An ECG records your heartbeat patterns over a 24-hour period. You may also have other tests, such as:  Transthoracic echocardiogram (TTE). During echocardiography, sound waves are used to evaluate how blood flows through your heart.  Transesophageal echocardiogram (TEE).  Stress test. There is more than one type of stress test. If a stress test is needed, ask your health care provider about which type is best for you.  Chest X-ray exam.  Blood tests.  Computed tomography (CT). TREATMENT  Treatment may include:  Treating any underlying conditions. For example, if you have an  overactive thyroid, treating the condition may correct atrial fibrillation.  Taking medicine. Medicines may be given to control a rapid heart rate or to  prevent blood clots, heart failure, or a stroke.  Having a procedure to correct the rhythm of the heart:  Electrical cardioversion. During electrical cardioversion, a controlled, low-energy shock is delivered to the heart through your skin. If you have chest pain, very low blood pressure, or sudden heart failure, this procedure may need to be done as an emergency.  Catheter ablation. During this procedure, heart tissues that send the signals that cause atrial fibrillation are destroyed.  Surgical ablation. During this surgery, thin lines of heart tissue that carry the abnormal signals are destroyed. This procedure can either be an open-heart surgery or a minimally invasive surgery. With the minimally invasive surgery, small cuts are made to access the heart instead of a large opening.  Pulmonary venous isolation. During this surgery, tissue around the veins that carry blood from the lungs (pulmonary veins) is destroyed. This tissue is thought to carry the abnormal signals. HOME CARE INSTRUCTIONS   Take medicines only as directed by your health care provider. Some medicines can make atrial fibrillation worse or recur.  If blood thinners were prescribed by your health care provider, take them exactly as directed. Too much blood-thinning medicine can cause bleeding. If you take too little, you will not have the needed protection against stroke and other problems.  Perform blood tests at home if directed by your health care provider. Perform blood tests exactly as directed.  Quit smoking if you smoke.  Do not drink alcohol.  Do not drink caffeinated beverages such as coffee, soda, and some teas. You may drink decaffeinated coffee, soda, or tea.   Maintain a healthy weight.Do not use diet pills unless your health care provider approves. They may make heart problems worse.   Follow diet instructions as directed by your health care provider.  Exercise regularly as directed by your health  care provider.  Keep all follow-up visits as directed by your health care provider. This is important. PREVENTION  The following substances can cause atrial fibrillation to recur:   Caffeinated beverages.  Alcohol.  Certain medicines, especially those used for breathing problems.  Certain herbs and herbal medicines, such as those containing ephedra or ginseng.  Illegal drugs, such as cocaine and amphetamines. Sometimes medicines are given to prevent atrial fibrillation from recurring. Proper treatment of any underlying condition is also important in helping prevent recurrence.  SEEK MEDICAL CARE IF:  You notice a change in the rate, rhythm, or strength of your heartbeat.  You suddenly begin urinating more frequently.  You tire more easily when exerting yourself or exercising. SEEK IMMEDIATE MEDICAL CARE IF:   You have chest pain, abdominal pain, sweating, or weakness.  You feel nauseous.  You have shortness of breath.  You suddenly have swollen feet and ankles.  You feel dizzy.  Your face or limbs feel numb or weak.  You have a change in your vision or speech. MAKE SURE YOU:   Understand these instructions.  Will watch your condition.  Will get help right away if you are not doing well or get worse. Document Released: 06/12/2005 Document Revised: 10/27/2013 Document Reviewed: 07/23/2012 [facility] Patient Information 2015 [place], [place]. This information is not intended to replace advice given to you by your health care provider. Make sure you discuss any questions you have with your health care provider.    What test will check on  my response to Coumadin? While on Coumadin (warfarin) you will need to have an INR test regularly to ensure that your dose is keeping you in the desired range. The INR (international normalized ratio) number is calculated from the result of the laboratory test called prothrombin time (PT).  If an INR APPOINTMENT HAS NOT ALREADY BEEN MADE  FOR YOU please schedule an appointment to have this lab work done by your health care provider within 7 days. Your INR goal is usually a number between:  2 to 3 or your provider may give you a more narrow range like 2-2.5.  Ask your health care provider during an office visit what your goal INR is.  What  do you need to  know  About  COUMADIN? Take Coumadin (warfarin) exactly as prescribed by your healthcare provider about the same time each day.  DO NOT stop taking without talking to the doctor who prescribed the medication.  Stopping without other blood clot prevention medication to take the place of Coumadin may increase your risk of developing a new clot or stroke.  Get refills before you run out.  What do you do if you miss a dose? If you miss a dose, take it as soon as you remember on the same day then continue your regularly scheduled regimen the next day.  Do not take two doses of Coumadin at the same time.  Important Safety Information A possible side effect of Coumadin (Warfarin) is an increased risk of bleeding. You should call your healthcare provider right away if you experience any of the following:   Bleeding from an injury or your nose that does not stop.   Unusual colored urine (red or dark brown) or unusual colored stools (red or black).   Unusual bruising for unknown reasons.   A serious fall or if you hit your head (even if there is no bleeding).  Some foods or medicines interact with Coumadin (warfarin) and might alter your response to warfarin. To help avoid this:   Eat a balanced diet, maintaining a consistent amount of Vitamin K.   Notify your provider about major diet changes you plan to make.   Avoid alcohol or limit your intake to 1 drink for women and 2 drinks for men per day. (1 drink is 5 oz. wine, 12 oz. beer, or 1.5 oz. liquor.)  Make sure that ANY health care provider who prescribes medication for you knows that you are taking Coumadin (warfarin).  Also make  sure the healthcare provider who is monitoring your Coumadin knows when you have started a new medication including herbals and non-prescription products.  Coumadin (Warfarin)  Major Drug Interactions  Increased Warfarin Effect Decreased Warfarin Effect  Alcohol (large quantities) Antibiotics (esp. Septra/Bactrim, Flagyl, Cipro) Amiodarone (Cordarone) Aspirin (ASA) Cimetidine (Tagamet) Megestrol (Megace) NSAIDs (ibuprofen, naproxen, etc.) Piroxicam (Feldene) Propafenone (Rythmol SR) Propranolol (Inderal) Isoniazid (INH) Posaconazole (Noxafil) Barbiturates (Phenobarbital) Carbamazepine (Tegretol) Chlordiazepoxide (Librium) Cholestyramine (Questran) Griseofulvin Oral Contraceptives Rifampin Sucralfate (Carafate) Vitamin K   Coumadin (Warfarin) Major Herbal Interactions  Increased Warfarin Effect Decreased Warfarin Effect  Garlic Ginseng Ginkgo biloba Coenzyme Q10 Green tea St. Johns wort    Coumadin (Warfarin) FOOD Interactions  Eat a consistent number of servings per week of foods HIGH in Vitamin K (1 serving =  cup)  Collards (cooked, or boiled & drained) Kale (cooked, or boiled & drained) Mustard greens (cooked, or boiled & drained) Parsley *serving size only =  cup Spinach (cooked, or boiled & drained) Swiss chard (  cooked, or boiled & drained) Turnip greens (cooked, or boiled & drained)  Eat a consistent number of servings per week of foods MEDIUM-HIGH in Vitamin K (1 serving = 1 cup)  Asparagus (cooked, or boiled & drained) Broccoli (cooked, boiled & drained, or raw & chopped) Brussel sprouts (cooked, or boiled & drained) *serving size only =  cup Lettuce, raw (green leaf, endive, romaine) Spinach, raw Turnip greens, raw & chopped   These websites have more information on Coumadin (warfarin):  FailFactory.se; VeganReport.com.au;

## 2014-03-06 NOTE — Procedures (Signed)
Electrical Cardioversion Procedure Note STEPHAUN MILLION [id number] Jul 02, 1933  Procedure: Electrical Cardioversion Indications:  Atrial Fibrillation. INR 3 on coumadin.  TEE with no LAA thrombus.   Procedure Details Consent: Risks of procedure as well as the alternatives and risks of each were explained to the (patient/caregiver).  Consent for procedure obtained. Time Out: Verified patient identification, verified procedure, site/side was marked, verified correct patient position, special equipment/implants available, medications/allergies/relevent history reviewed, required imaging and test results available.  Performed  Patient placed on cardiac monitor, pulse oximetry, supplemental oxygen as necessary.  Sedation given: Propofol.  Pacer pads placed anterior and posterior chest.  Cardioverted 1 time(s).  Cardioverted at [place].  Evaluation Findings: Post procedure EKG shows: NSR.  He was bradycardic in 40s after procedure.  I will stop Toprol XL for now but will continue amiodarone.  Complications: None Patient did tolerate procedure well.   Loralie Champagne 03/06/2014, 1:37 PM

## 2014-03-06 NOTE — Progress Notes (Signed)
Pt fully awake on arrival to floor and VSS.  Requesting to eat but clear liquid as throat still sore.  Dr. Carney Corners notified and ordered clear liquid diet.  Will continue to monitor.  Karie Kirks, Therapist, sports.

## 2014-03-06 NOTE — Transfer of Care (Signed)
Immediate Anesthesia Transfer of Care Note  Patient: [name]  Procedure(s) Performed: Procedure(s): TRANSESOPHAGEAL ECHOCARDIOGRAM (TEE) (N/A) CARDIOVERSION (N/A)  Patient Location: Endoscopy Unit  Anesthesia Type:MAC  Level of Consciousness: awake and sedated  Airway & Oxygen Therapy: Patient Spontanous Breathing and Patient connected to face mask oxygen  Post-op Assessment: Report given to PACU RN, Post -op Vital signs reviewed and stable, Patient moving all extremities and Patient able to stick tongue midline  Post vital signs: Reviewed and stable  Complications: No apparent anesthesia complications

## 2014-03-06 NOTE — Progress Notes (Addendum)
Patient Name: [name] Date of Encounter: 03/06/2014     Principal Problem:   Acute systolic CHF (congestive heart failure), NYHA class 3 Active Problems:   Renal insufficiency   Hyperlipidemia   Atrial fibrillation   Acute diastolic CHF (congestive heart failure), NYHA class 3    SUBJECTIVE  Denies any chest discomfort or SOB  CURRENT MEDS . amLODipine  10 mg Oral Daily  . antiseptic oral rinse  7 mL Mouth Rinse BID  . atorvastatin  80 mg Oral QHS  . cholecalciferol  1,000 Units Oral q1800  . cloNIDine  0.2 mg Oral BID  . clopidogrel  75 mg Oral Daily  . famotidine  20 mg Oral Daily  . fenofibrate  160 mg Oral Daily  . furosemide  80 mg Intravenous BID  . insulin aspart  0-15 Units Subcutaneous TID WC  . insulin glargine  15 Units Subcutaneous Daily  . isosorbide mononitrate  60 mg Oral Daily  . levothyroxine  75 mcg Oral QAC breakfast  . metoprolol succinate  25 mg Oral Daily  . multivitamin with minerals  1 tablet Oral QHS  . mupirocin cream   Topical BID  . sodium chloride  3 mL Intravenous Q12H  . Warfarin - Pharmacist Dosing Inpatient   Does not apply q1800    OBJECTIVE  Filed Vitals:   03/05/14 1425 03/05/14 1756 03/05/14 2059 03/06/14 0724  BP: 117/64 127/61 141/72 148/73  Pulse: 51 78 74 78  Temp: 97.7 F (36.5 C) 97.9 F (36.6 C) 98 F (36.7 C) 97.7 F (36.5 C)  TempSrc: Oral Oral Oral Oral  Resp:   20 18  Height:      Weight:    195 lb 5.2 oz (88.6 kg)  SpO2: 93% 92% 93% 94%    Intake/Output Summary (Last 24 hours) at 03/06/14 0803 Last data filed at 03/06/14 0700  Gross per 24 hour  Intake   1200 ml  Output   2025 ml  Net   -825 ml   Filed Weights   03/05/14 0623 03/05/14 0625 03/06/14 0724  Weight: 197 lb 12 oz (89.7 kg) 198 lb 6.6 oz (90 kg) 195 lb 5.2 oz (88.6 kg)    PHYSICAL EXAM  General: Pleasant, NAD. Neuro: Alert and oriented X 3. Moves all extremities spontaneously. Psych: Normal affect. HEENT:  Normal  Neck:  Supple without bruits or JVD. Lungs:  Resp regular and unlabored, diminished RLL breathing sound, otherwise CTA Heart: irregular no s3, s4, or murmurs. Abdomen: Soft, non-tender, non-distended, BS + x 4.  Extremities: No clubbing, cyanosis or edema. DP/PT/Radials 2+ and equal bilaterally.  Accessory Clinical Findings  CBC  Recent Labs  03/04/14 1143 03/05/14 0008 03/06/14 0354  WBC 4.2 5.4 5.0  NEUTROABS 3.0  --   --   HGB 13.5 13.2 14.2  HCT 43.5 41.3 43.2  MCV 95.0 93.7 90.2  PLT 189 183 165   Basic Metabolic Panel  Recent Labs  03/05/14 0008 03/06/14 0354  NA 139 141  K 4.0 4.0  CL 93* 92*  CO2 34* 33*  GLUCOSE 164* 114*  BUN 45* 51*  CREATININE 2.59* 2.97*  CALCIUM 9.0 9.5   Liver Function Tests  Recent Labs  03/05/14 0008  AST 53*  ALT 37  ALKPHOS 39  BILITOT 0.5  PROT 5.9*  ALBUMIN 3.4*   No results found for this basename: LIPASE, AMYLASE,  in the last 72 hours Cardiac Enzymes  Recent Labs  03/04/14 1919 03/04/14  2338 03/05/14 0710  TROPONINI <0.30 <0.30 <0.30   Hemoglobin A1C  Recent Labs  03/04/14 1919  HGBA1C 7.3*   Thyroid Function Tests  Recent Labs  03/05/14 1208  TSH 55.500*    TELE A-fib with HR 70-80s, no significant ventricular ectopy    ECG  A-fib with HR 80s  Echocardiogram  03/05/2014 LV EF: 45% - 50%  ------------------------------------------------------------------- Indications: CHF - 428.0.  ------------------------------------------------------------------- History: PMH: Renal disease. Dyspnea. Atrial fibrillation. Risk factors: Hypertension. Diabetes mellitus. Dyslipidemia.  ------------------------------------------------------------------- Study Conclusions  - Left ventricle: The cavity size was normal. Wall thickness was increased in a pattern of moderate LVH. Systolic function was mildly reduced. The estimated ejection fraction was in the range of 45% to 50%. Diffuse hypokinesis. - Aortic  valve: Valve mobility was restricted. There was moderate stenosis. There was mild regurgitation. Valve area (VTI): 1.01 cm^2. Valve area (Vmax): 1 cm^2. Valve area (Vmean): 0.96 cm^2. - Mitral valve: Calcified annulus. - Left atrium: The atrium was moderately dilated. - Right ventricle: The cavity size was mildly dilated. - Right atrium: The atrium was mildly dilated.  Impressions:  - Technically difficult; LV function appears to be mildly reduced with EF approximately 45; calcified aortic valve with reduced cusp excursion; probable moderate AS (mean gradient 11 mmHg but AVA by continuity equation 1 cm2); mild AI; biatrial enlargement; mild RVE.     Radiology/Studies  Dg Chest 2 View  03/04/2014   CLINICAL DATA:  Shortness of breath, history hypertension, hyperlipidemia, diabetes, coronary artery disease post CABG, CHF, chronic renal insufficiency, GERD  EXAM: CHEST  2 VIEW  COMPARISON:  03/28/2013  FINDINGS: Enlargement of cardiac silhouette post CABG.  Slight pulmonary vascular congestion.  Chronic accentuation of perihilar markings little changed.  Increased RIGHT pleural effusion and basilar atelectasis.  Tiny LEFT pleural effusion.  No definite acute pulmonary edema or segmental consolidation.  No pneumothorax.  Bones demineralized.  IMPRESSION: Enlargement of cardiac silhouette with slight pulmonary vascular congestion post CABG.  RIGHT basilar atelectasis and increased pleural effusion.   Electronically Signed   By: Lavonia Dana M.D.   On: 03/04/2014 12:38    ASSESSMENT AND PLAN  1. Acute on chronic systolic and diastolic heart failure  - EF on echo in 03/2013 83-41%, grade 1 diastolic dysfunct  - Echo 03/05/2014 EF 45-50%, diffuse hypokinesis, moderate LVH, moderate AS, moderate LA  - previous on 80mg  PO lasix until it was discontinued Dec 2014 due to worsening renal function   - -3.9L, weight down from 206 to 195lbs  - D/C IV lasix, Cr 2.59 --> 2.97, will allow kidney to recover  today, and start PO lasix 80mg  daily tomorrow  2. Atrial fibrillation, newly diagnosed by Dr. Acie Fredrickson in August   - rate controlled, anticoagulation with coumadin   - moderately dilated LA with moderate AS, may still be a candidate for TEE/Cardioversion, also discussed with coumadin x 3wks then cardioversion w/o TEE with family, family will make a decision. Remain NPO  3. Chronic renal insufficiency, stage IV   - Cr 2.59 --> 2.97  - follow Dr. Lorrene Reid, LUE AV fistula placed June 2014 in anticipation of future dialysis, has not used yet   4. Supratherapeutic INR   - manage per pharmacy   5. Significant elevated TSH of 61   - on synthroid   - repeat lab TSH 55.5, free T4 0.71  - states he has been taking the same low dose synthroid dose for several yrs, need to adjust synthroid dose  6. R flank pain: tender when palpate RUQ, likely due to hepatic congestion   - AST mildly elevated, ALT normal   Signed, [name] Pager: [phone]  Patient seen and examined with Peter Deforest, PA-C. We discussed all aspects of the encounter. I agree with the assessment and plan as stated above.   He is much improved with diuresis. Renal function now starting to bump (baseline 2.6). Agree with holding lasix. Suspect HF is due to AF. Echo with EF ~50% with moderate AS. Doubt he will tolerate AF well. I discussed TEE/DC-CV with him and family and we will proceed today. LA is almost 5cm. So will start amio to help prevent recurrence. 400 bid x 1 week. Then 200 bid. Continue coumadin. Will need PT.   Hopefully home tomorrow after DC-CV if renal function stable.   Carnel Stegman,MD 9:17 AM

## 2014-03-06 NOTE — Anesthesia Preprocedure Evaluation (Signed)
Anesthesia Evaluation  Patient identified by MRN, date of birth, ID band Patient awake    Reviewed: Allergy & Precautions, H&P , NPO status , Patient's Chart, lab work & pertinent test results  History of Anesthesia Complications Negative for: history of anesthetic complications  Airway Mallampati: III TM Distance: >3 FB Neck ROM: Full    Dental  (+) Edentulous Upper, Missing   Pulmonary neg sleep apnea, neg COPD + rhonchi         Cardiovascular hypertension, Pt. on medications - angina+ CABG, + Peripheral Vascular Disease, +CHF and + Orthopnea + dysrhythmias Atrial Fibrillation + Valvular Problems/Murmurs AS Rhythm:Irregular     Neuro/Psych negative neurological ROS  negative psych ROS   GI/Hepatic Neg liver ROS, GERD-  Medicated and Controlled,  Endo/Other  diabetes, Type 2, Insulin DependentHypothyroidism   Renal/GU Renal Insufficiency and CRFRenal disease     Musculoskeletal   Abdominal   Peds  Hematology   Anesthesia Other Findings   Reproductive/Obstetrics                           Anesthesia Physical Anesthesia Plan  ASA: III  Anesthesia Plan: MAC   Post-op Pain Management:    Induction: Intravenous  Airway Management Planned: Natural Airway  Additional Equipment: None  Intra-op Plan:   Post-operative Plan: Extubation in OR  Informed Consent: I have reviewed the patients History and Physical, chart, labs and discussed the procedure including the risks, benefits and alternatives for the proposed anesthesia with the patient or authorized representative who has indicated his/her understanding and acceptance.     Plan Discussed with: CRNA and Surgeon  Anesthesia Plan Comments:         Anesthesia Quick Evaluation

## 2014-03-06 NOTE — Progress Notes (Signed)
  Echocardiogram Echocardiogram Transesophageal has been performed.  Mauricio Po 03/06/2014, 1:34 PM

## 2014-03-06 NOTE — Progress Notes (Signed)
ANTICOAGULATION CONSULT NOTE - Follow Up Consult  Pharmacy Consult for Coumadin Indication: atrial fibrillation  Allergies  Allergen Reactions  . Betadine [Povidone Iodine] Rash  . Iodinated Diagnostic Agents Rash and Other (See Comments)    Does fine with premedications for cath  . Latex Hives    Patient Measurements: Height: 5\' 10"  (177.8 cm) Weight: 195 lb 5.2 oz (88.6 kg) IBW/kg (Calculated) : 73 Heparin Dosing Weight:   Vital Signs: Temp: 97.7 F (36.5 C) (09/11 0724) Temp src: Oral (09/11 0724) BP: 131/70 mmHg (09/11 0919) Pulse Rate: 80 (09/11 0920)  Labs:  Recent Labs  03/04/14 1143 03/04/14 1919 03/04/14 2338 03/05/14 0008 03/05/14 0710 03/06/14 0354  HGB 13.5  --   --  13.2  --  14.2  HCT 43.5  --   --  41.3  --  43.2  PLT 189  --   --  183  --  188  LABPROT 45.8*  --   --  43.4*  --  31.6*  INR 4.92*  --   --  4.59*  --  3.06*  CREATININE 2.77*  --   --  2.59*  --  2.97*  TROPONINI <0.30 <0.30 <0.30  --  <0.30  --     Estimated Creatinine Clearance: 23 ml/min (by C-G formula based on Cr of 2.97).   Medications:  Scheduled:  . amiodarone  200 mg Oral BID  . amLODipine  10 mg Oral Daily  . antiseptic oral rinse  7 mL Mouth Rinse BID  . atorvastatin  80 mg Oral QHS  . cholecalciferol  1,000 Units Oral q1800  . cloNIDine  0.2 mg Oral BID  . clopidogrel  75 mg Oral Daily  . famotidine  20 mg Oral Daily  . fenofibrate  160 mg Oral Daily  . insulin aspart  0-15 Units Subcutaneous TID WC  . insulin glargine  15 Units Subcutaneous Daily  . isosorbide mononitrate  60 mg Oral Daily  . levothyroxine  75 mcg Oral QAC breakfast  . metoprolol succinate  25 mg Oral Daily  . multivitamin with minerals  1 tablet Oral QHS  . mupirocin cream   Topical BID  . sodium chloride  3 mL Intravenous Q12H  . sodium chloride  3 mL Intravenous Q12H  . Warfarin - Pharmacist Dosing Inpatient   Does not apply q1800    Assessment: 80yo male with AFib, admitted with INR  4.92 and now down to 3.06 this AM- a large drop in ~48hr, no Vit K.  Amiodarone has been added, a new medication for this pt which may increase Coumadin effect.  No bleeding noted.  CBC wnl  Goal of Therapy:  INR 2-3 Monitor platelets by anticoagulation protocol: Yes   Plan:  1-  Coumadin 3mg  as expect INR to continue to drop, watch with addition of Amiodarone 2-  F/U in AM  Gracy Bruins, PharmD [facility]

## 2014-03-06 NOTE — CV Procedure (Addendum)
Procedure: TEE  Indication: Atrial fibrillation  Sedation: Propofol per anesthesiology  Findings: Please see echo section for full report.  Normal LV size with EF 50%.  Mild diffuse hypokinesis.  Normal RV size with mildly decreased systolic function.  Mild MR.  Visually there was moderate aortic stenosis but mean gradient was in the mild range, 11 mmHg. There was mild AI.  Moderate LAE.  There was no LA appendage thrombus.  Negative bubble study.  Grade IV plaque in the aortic arch and descending thoracic aorta.    May proceed with DCCV  Loralie Champagne 03/06/2014

## 2014-03-07 DIAGNOSIS — E119 Type 2 diabetes mellitus without complications: Secondary | ICD-10-CM

## 2014-03-07 LAB — BASIC METABOLIC PANEL
Anion gap: 11 (ref 5–15)
BUN: 47 mg/dL — AB (ref 6–23)
CO2: 36 meq/L — AB (ref 19–32)
CREATININE: 2.9 mg/dL — AB (ref 0.50–1.35)
Calcium: 9.6 mg/dL (ref 8.4–10.5)
Chloride: 93 mEq/L — ABNORMAL LOW (ref 96–112)
GFR calc Af Amer: 22 mL/min — ABNORMAL LOW (ref >90)
GFR calc non Af Amer: 19 mL/min — ABNORMAL LOW (ref >90)
GLUCOSE: 160 mg/dL — AB (ref 70–99)
Potassium: 4.1 mEq/L (ref 3.7–5.3)
Sodium: 140 mEq/L (ref 137–147)

## 2014-03-07 LAB — CBC
HEMATOCRIT: 45.6 % (ref 39.0–52.0)
HEMOGLOBIN: 14.8 g/dL (ref 13.0–17.0)
MCH: 29.7 pg (ref 26.0–34.0)
MCHC: 32.5 g/dL (ref 30.0–36.0)
MCV: 91.4 fL (ref 78.0–100.0)
PLATELETS: 173 10*3/uL (ref 150–400)
RBC: 4.99 MIL/uL (ref 4.22–5.81)
RDW: 14.5 % (ref 11.5–15.5)
WBC: 4.8 10*3/uL (ref 4.0–10.5)

## 2014-03-07 LAB — GLUCOSE, CAPILLARY
GLUCOSE-CAPILLARY: 158 mg/dL — AB (ref 70–99)
Glucose-Capillary: 201 mg/dL — ABNORMAL HIGH (ref 70–99)

## 2014-03-07 LAB — PROTIME-INR
INR: 2.62 — ABNORMAL HIGH (ref 0.00–1.49)
Prothrombin Time: 28 seconds — ABNORMAL HIGH (ref 11.6–15.2)

## 2014-03-07 MED ORDER — LEVOTHYROXINE SODIUM 100 MCG PO TABS
100.0000 ug | ORAL_TABLET | Freq: Every day | ORAL | Status: DC
  Filled 2014-03-07: qty 1

## 2014-03-07 MED ORDER — AMIODARONE HCL 200 MG PO TABS: 200.0000 mg | ORAL_TABLET | Freq: Two times a day (BID) | ORAL | Status: DC

## 2014-03-07 MED ORDER — LEVOTHYROXINE SODIUM 100 MCG PO TABS: 100.0000 ug | ORAL_TABLET | Freq: Every day | ORAL | Status: DC

## 2014-03-07 MED ORDER — WARFARIN SODIUM 5 MG PO TABS: 2.5000 mg | ORAL_TABLET | Freq: Every day | ORAL | Status: DC

## 2014-03-07 MED ORDER — WARFARIN SODIUM 3 MG PO TABS
3.0000 mg | ORAL_TABLET | Freq: Once | ORAL | Status: DC
  Filled 2014-03-07: qty 1

## 2014-03-07 NOTE — Progress Notes (Signed)
ANTICOAGULATION CONSULT NOTE - Follow Up Consult  Pharmacy Consult for Coumadin Indication: atrial fibrillation  Allergies  Allergen Reactions  . Betadine [Povidone Iodine] Rash  . Iodinated Diagnostic Agents Rash and Other (See Comments)    Does fine with premedications for cath  . Latex Hives    Patient Measurements: Height: 5\' 10"  (177.8 cm) Weight: 190 lb 0.6 oz (86.2 kg) IBW/kg (Calculated) : 73 Heparin Dosing Weight:   Vital Signs: Temp: 97.3 F (36.3 C) (09/12 0700) Temp src: Oral (09/12 0700) BP: 130/60 mmHg (09/12 0957) Pulse Rate: 90 (09/12 0700)  Labs:  Recent Labs  03/04/14 1919 03/04/14 2338 03/05/14 0008 03/05/14 0710 03/06/14 0354 03/07/14 0600  HGB  --   --  13.2  --  14.2 14.8  HCT  --   --  41.3  --  43.2 45.6  PLT  --   --  183  --  188 173  LABPROT  --   --  43.4*  --  31.6* 28.0*  INR  --   --  4.59*  --  3.06* 2.62*  CREATININE  --   --  2.59*  --  2.97* 2.90*  TROPONINI <0.30 <0.30  --  <0.30  --   --     Estimated Creatinine Clearance: 21.7 ml/min (by C-G formula based on Cr of 2.9).   Medications:  Scheduled:  . amiodarone  200 mg Oral BID  . amLODipine  10 mg Oral Daily  . antiseptic oral rinse  7 mL Mouth Rinse BID  . atorvastatin  80 mg Oral QHS  . cholecalciferol  1,000 Units Oral q1800  . cloNIDine  0.2 mg Oral BID  . clopidogrel  75 mg Oral Daily  . famotidine  20 mg Oral Daily  . fenofibrate  160 mg Oral Daily  . insulin aspart  0-15 Units Subcutaneous TID WC  . insulin glargine  15 Units Subcutaneous Daily  . isosorbide mononitrate  60 mg Oral Daily  . [START ON 03/08/2014] levothyroxine  100 mcg Oral QAC breakfast  . multivitamin with minerals  1 tablet Oral QHS  . mupirocin cream   Topical BID  . sodium chloride  3 mL Intravenous Q12H  . sodium chloride  3 mL Intravenous Q12H  . Warfarin - Pharmacist Dosing Inpatient   Does not apply q1800    Assessment: 80yo male with AFib, admitted with elevated INR on home  dose of 5mg  daily x 2.5mg  MWF.  Pt with nose bleed 9/10 but no recurrence of this last PM.  Hg/pltc wnl.  Goal of Therapy:  INR 2-3 Monitor platelets by anticoagulation protocol: Yes   Plan:  1-  Coumadin 3mg  2-  F/U in AM  Gracy Bruins, PharmD [facility]

## 2014-03-07 NOTE — Progress Notes (Addendum)
Subjective:  80 year old with atrial fibrillation status post cardioversion on 03/06/14 on amiodarone, subsequent bradycardia in the 40s after procedure, Toprol held.  Denies any chest pain or shortness of breath family in room. Feels good  Objective:  Vital Signs in the last 24 hours: Temp:  [97.3 F (36.3 C)-97.7 F (36.5 C)] 97.3 F (36.3 C) (09/12 0700) Pulse Rate:  [46-94] 90 (09/12 0700) Resp:  [16-28] 18 (09/12 0700) BP: (113-171)/(47-74) 130/60 mmHg (09/12 0957) SpO2:  [92 %-100 %] 97 % (09/12 0700) Weight:  [190 lb 0.6 oz (86.2 kg)] 190 lb 0.6 oz (86.2 kg) (09/12 0700)  Intake/Output from previous day: 09/11 0701 - 09/12 0700 In: 852 [P.O.:840; I.V.:12] Out: 600 [Urine:600]   Physical Exam: General: Well developed, well nourished, in no acute distress. Head:  Normocephalic and atraumatic. Lungs: Clear to auscultation and percussion. Heart: Bradycardic.  2/6 systolic right upper sternal border murmur, rubs or gallops.  Abdomen: soft, non-tender, positive bowel sounds. Extremities: No clubbing or cyanosis. No edema. Neurologic: Alert and oriented x 3.    Lab Results:  Recent Labs  03/06/14 0354 03/07/14 0600  WBC 5.0 4.8  HGB 14.2 14.8  PLT 188 173    Recent Labs  03/06/14 0354 03/07/14 0600  NA 141 140  K 4.0 4.1  CL 92* 93*  CO2 33* 36*  GLUCOSE 114* 160*  BUN 51* 47*  CREATININE 2.97* 2.90*    Recent Labs  03/04/14 2338 03/05/14 0710  TROPONINI <0.30 <0.30   Hepatic Function Panel  Recent Labs  03/05/14 0008  PROT 5.9*  ALBUMIN 3.4*  AST 53*  ALT 37  ALKPHOS 39  BILITOT 0.5     Telemetry: Sinus bradycardia rate 49, no pauses Personally viewed.    Cardiac Studies:  EF 45-50%, moderate aortic valve stenosis  Scheduled Meds: . amiodarone  200 mg Oral BID  . amLODipine  10 mg Oral Daily  . antiseptic oral rinse  7 mL Mouth Rinse BID  . atorvastatin  80 mg Oral QHS  . cholecalciferol  1,000 Units Oral q1800  . cloNIDine   0.2 mg Oral BID  . clopidogrel  75 mg Oral Daily  . famotidine  20 mg Oral Daily  . fenofibrate  160 mg Oral Daily  . insulin aspart  0-15 Units Subcutaneous TID WC  . insulin glargine  15 Units Subcutaneous Daily  . isosorbide mononitrate  60 mg Oral Daily  . [START ON 03/08/2014] levothyroxine  100 mcg Oral QAC breakfast  . multivitamin with minerals  1 tablet Oral QHS  . mupirocin cream   Topical BID  . sodium chloride  3 mL Intravenous Q12H  . sodium chloride  3 mL Intravenous Q12H  . Warfarin - Pharmacist Dosing Inpatient   Does not apply q1800   Continuous Infusions: . sodium chloride 250 mL (03/06/14 1754)  . sodium chloride 250 mL (03/06/14 1005)   PRN Meds:.sodium chloride, acetaminophen, ALPRAZolam, fluticasone, nitroGLYCERIN, ondansetron (ZOFRAN) IV, polyethylene glycol, sodium chloride, sodium chloride, trolamine salicylate, zolpidem  Assessment/Plan:   1. Paroxysmal atrial fibrillation-newly diagnosed by Dr. Acie Fredrickson in August   - TEE cardioversion took place on 9/11-successful on amiodarone. Subsequent bradycardia, Toprol held.  - He has had long-standing history of bradycardia and has not been on high-dose beta blocker or calcium channel blocker for this reason.  2. Chronic anticoagulation  - Coumadin, 2.6, therapeutic  3. Chronic kidney disease stage IV-creatinine 2.9  - Dr. Lorrene Reid, left upper extremity AV fistula placed in  June of 2014 has not used yet. Anticipatory dialysis in the future.  4. Hypothyroidism  - TSH 55.5, free T4 0.71.  -  He has been taking this same low-dose Synthroid 75 mcg.  - I will increase dose to 100 mcg. Continue to follow with PCP.   5. Coronary artery disease-status post bypass surgery  - Prior cardiac catheterization reviewed, severe native coronary artery disease but patent grafts to LAD, first diagonal and right coronary artery. The graft to the circumflex vessel has a tight stenosis but the circumflex distribution is very small.  Unchanged from prior cath. Medical therapy. Myoview demonstrated small inferolateral defect.  - Dr. Acie Fredrickson on 02/13/14 in his clinic note clearly states that he was started on Coumadin and continuing on Plavix. One could consider Coumadin only to help decrease bleeding risks. Readdress as outpatient.  6. Cardiomyopathy-ejection fraction 45%  - He would be nice for him to be on a beta blocker however bradycardia noted.  - No ACE inhibitor because of chronic kidney disease stage IV.  - Continuing with isosorbide.  7. Peripheral vascular disease  - Abnormal ABI. He has seen Dr. Scot Dock. He states that although his duplex suggested a 50-69% left common iliac artery stenosis he has a good femoral pulse on the left. Although the duplex suggested a 50-69% right SFA stenosis, he has a palpable right popliteal pulse. Given that he is essentially asymptomatic I would not recommend any further workup at this time. He will continue to follow him.  - This is likely why he is on Plavix as well.  - Conservative management by Dr. Scot Dock  I'm comfortable with discharge. We will continue to hold Toprol because of amiodarone use.  SKAINS, [place] 03/07/2014, 10:04 AM

## 2014-03-07 NOTE — Discharge Summary (Signed)
Patient ID: [name],  MRN: [id number], DOB/AGE: 07/28/1933 80 y.o.  Admit date: 03/04/2014 Discharge date: 03/07/2014  Primary Care Provider: Donnajean Lopes, MD Primary Cardiologist: Dr Acie Fredrickson  Discharge Diagnoses Principal Problem:   Acute systolic CHF (congestive heart failure), NYHA class 3 Active Problems:   Renal insufficiency   Hyperlipidemia   Atrial fibrillation   Acute diastolic CHF (congestive heart failure), NYHA class 3    Procedures:  TEE/CV 03/06/14   Hospital Course:  Pt is a 80 y.o. male with a history of CAD and CABG, HTN, HLD, DM, hypothyroid and CKD stage IV. He had a fistula in 2014. He was seen by Dr. Acie Fredrickson on 08/21 and diagnosed at that time with atrial fibrillation. He has a long history of bradycardia and has not been on a high dose of a beta blocker or rate lowering calcium channel blocker for that reason. He has tolerated Toprol-XL 25 mg daily in the past. When he was seen for the atrial fibrillation, Toprol was increased to 50 mg daily and he was started on Coumadin. His aspirin was discontinued and he was to remain on daily Plavix per Dr Acie Fredrickson. He was admitted and felt to have a component of acute CHF. He was diuresed 15 lbs. He ultimately underwent TEE CV 03/06/14. He had documented bradycardia and his Toprol was discontinued. Dr Marlou Porch saw him the morning of discharge and felt he was stable. At discharge Drc Skains decided to send him home on Coumadin alone. The pt will follow up with Dr Acie Fredrickson in two weeks. He is scheduled for an INR Tuesday morning. His Coumadin was decreased to 2.5 mg daily now that he is on Amiodarone. During this admission it was also noted that the pt had a TSH of 61. This was repeated with a free T4 which was low-0.71. His Synthroid was increased to 100 mcg daily. This will need follow up as an OP. Also, his Amiodarone should be decreased to 200 mg daily when he is seen in follow up in two weeks.    Discharge Vitals:  Blood  pressure 130/60, pulse 90, temperature 97.3 F (36.3 C), temperature source Oral, resp. rate 18, height 5\' 10"  (1.778 m), weight 190 lb 0.6 oz (86.2 kg), SpO2 97.00%.    Labs: Results for orders placed during the hospital encounter of 03/04/14 (from the past 24 hour(s))  GLUCOSE, CAPILLARY     Status: Abnormal   Collection Time    03/06/14  4:53 PM      Result Value Ref Range   Glucose-Capillary 104 (*) 70 - 99 mg/dL   Comment 1 Notify RN    GLUCOSE, CAPILLARY     Status: Abnormal   Collection Time    03/06/14 10:47 PM      Result Value Ref Range   Glucose-Capillary 163 (*) 70 - 99 mg/dL   Comment 1 Documented in Chart     Comment 2 Notify RN    BASIC METABOLIC PANEL     Status: Abnormal   Collection Time    03/07/14  6:00 AM      Result Value Ref Range   Sodium 140  137 - 147 mEq/L   Potassium 4.1  3.7 - 5.3 mEq/L   Chloride 93 (*) 96 - 112 mEq/L   CO2 36 (*) 19 - 32 mEq/L   Glucose, Bld 160 (*) 70 - 99 mg/dL   BUN 47 (*) 6 - 23 mg/dL   Creatinine, Ser 2.90 (*)  0.50 - 1.35 mg/dL   Calcium 9.6  8.4 - 10.5 mg/dL   GFR calc non Af Amer 19 (*) >90 mL/min   GFR calc Af Amer 22 (*) >90 mL/min   Anion gap 11  5 - 15  PROTIME-INR     Status: Abnormal   Collection Time    03/07/14  6:00 AM      Result Value Ref Range   Prothrombin Time 28.0 (*) 11.6 - 15.2 seconds   INR 2.62 (*) 0.00 - 1.49  CBC     Status: None   Collection Time    03/07/14  6:00 AM      Result Value Ref Range   WBC 4.8  4.0 - 10.5 K/uL   RBC 4.99  4.22 - 5.81 MIL/uL   Hemoglobin 14.8  13.0 - 17.0 g/dL   HCT 45.6  39.0 - 52.0 %   MCV 91.4  78.0 - 100.0 fL   MCH 29.7  26.0 - 34.0 pg   MCHC 32.5  30.0 - 36.0 g/dL   RDW 14.5  11.5 - 15.5 %   Platelets 173  150 - 400 K/uL  GLUCOSE, CAPILLARY     Status: Abnormal   Collection Time    03/07/14  6:16 AM      Result Value Ref Range   Glucose-Capillary 158 (*) 70 - 99 mg/dL    Disposition:      Follow-up Information   Follow up with Darden Amber.,  MD In 2 weeks. (office will call you)    Specialty:  Cardiology   Contact information:   [address] [place] [address] [phone]       Follow up with [facility] On 03/17/2014. (INR to be checked Tuesday as scheduled)    Specialty:  Cardiology   Contact information:   [address] [place] [address] [phone]      Discharge Medications:    Medication List    STOP taking these medications       metoprolol succinate 25 MG 24 hr tablet  Commonly known as:  TOPROL XL     PLAVIX 75 MG tablet  Generic drug:  clopidogrel      TAKE these medications       acetaminophen 500 MG tablet  Commonly known as:  TYLENOL  Take 1,000 mg by mouth 2 (two) times daily as needed for mild pain or moderate pain.     amiodarone 200 MG tablet  Commonly known as:  PACERONE  Take 1 tablet (200 mg total) by mouth 2 (two) times daily.     amLODipine 10 MG tablet  Commonly known as:  NORVASC  Take 10 mg by mouth daily.     atorvastatin 80 MG tablet  Commonly known as:  LIPITOR  Take 80 mg by mouth at bedtime.     cholecalciferol 1000 UNITS tablet  Commonly known as:  VITAMIN D  Take 1,000 Units by mouth daily at 6 PM.     cloNIDine 0.2 MG tablet  Commonly known as:  CATAPRES  Take 0.2 mg by mouth 2 (two) times daily.     fenofibrate 160 MG tablet  Take 160 mg by mouth daily.     fluticasone 50 MCG/ACT nasal spray  Commonly known as:  FLONASE  Place 2 sprays into both nostrils daily as needed for allergies.     furosemide 80 MG tablet  Commonly known as:  LASIX  Take 40 mg by mouth daily as needed for fluid.     isosorbide mononitrate 60 MG 24 hr tablet  Commonly known as:  IMDUR  Take 60 mg by mouth daily.     LANTUS SOLOSTAR 100 UNIT/ML Solostar Pen  Generic drug:  Insulin Glargine  Inject 15 Units into the skin daily.     levothyroxine 100 MCG tablet  Commonly known as:  SYNTHROID, LEVOTHROID  Take 1  tablet (100 mcg total) by mouth daily before breakfast.  Start taking on:  03/08/2014     multivitamin with minerals Tabs tablet  Take 1 tablet by mouth at bedtime.     nitroGLYCERIN 0.4 MG SL tablet  Commonly known as:  NITROSTAT  Place 1 tablet (0.4 mg total) under the tongue every 5 (five) minutes as needed for chest pain.     NOVOLOG FLEXPEN 100 UNIT/ML FlexPen  Generic drug:  insulin aspart  Inject 5 Units into the skin 2 (two) times daily.     polyethylene glycol packet  Commonly known as:  MIRALAX / GLYCOLAX  Take 17 g by mouth daily as needed for moderate constipation.     ranitidine 300 MG tablet  Commonly known as:  ZANTAC  Take 300 mg by mouth 2 (two) times daily.     trolamine salicylate 10 % cream  Commonly known as:  ASPERCREME  Apply 1 application topically 2 (two) times daily as needed for muscle pain.     warfarin 5 MG tablet  Commonly known as:  COUMADIN  Take 0.5-1 tablets (2.5-5 mg total) by mouth daily. Take 2.5 mg daily         Duration of Discharge Encounter: Greater than 30 minutes including physician time.  Angelena Form PA-C 03/07/2014 11:47 AM

## 2014-03-08 NOTE — Discharge Summary (Signed)
Personally seen and examined. Agree with above. Coumadin only. No Plavix.  Candee Furbish, MD

## 2014-03-09 ENCOUNTER — Encounter (HOSPITAL_COMMUNITY): Payer: Self-pay | Admitting: Cardiology

## 2014-03-09 ENCOUNTER — Telehealth: Payer: Self-pay | Admitting: Cardiovascular Disease

## 2014-03-09 NOTE — Telephone Encounter (Signed)
New message ° ° ° ° ° °Talk to a nurse regarding his medications °

## 2014-03-09 NOTE — Telephone Encounter (Signed)
Spoke with patient's wife who called with questions regarding discharge medication instructions.  I advised wife per Kerin Ransom, PA-C and Dr. Marlou Porch, patient is to decrease Coumadin to 2.5 mg daily.  Patient has appointment in [facility] tomorrow, 9/15; wife is aware.  Wife questioned whether patient will continue Atorvastatin 80 mg with Amiodarone.  I spoke with Alferd Apa, PharmD who advised patient may continue this dose until f/u as this is likely a short term dose of Amiodarone 200 mg BID bolus dose.  Pt scheduled to f/u with Melina Copa, PA-C on 9/23.  Patient's wife verbalized understanding and agreement.

## 2014-03-10 ENCOUNTER — Ambulatory Visit (INDEPENDENT_AMBULATORY_CARE_PROVIDER_SITE_OTHER): Payer: Medicare Other | Admitting: Pharmacist

## 2014-03-10 DIAGNOSIS — I4891 Unspecified atrial fibrillation: Secondary | ICD-10-CM

## 2014-03-10 DIAGNOSIS — Z5181 Encounter for therapeutic drug level monitoring: Secondary | ICD-10-CM | POA: Diagnosis not present

## 2014-03-10 DIAGNOSIS — I4819 Other persistent atrial fibrillation: Secondary | ICD-10-CM

## 2014-03-10 LAB — POCT INR: INR: 3

## 2014-03-11 NOTE — Anesthesia Postprocedure Evaluation (Signed)
  Anesthesia Post-op Note  Patient: [name]  Procedure(s) Performed: Procedure(s): TRANSESOPHAGEAL ECHOCARDIOGRAM (TEE) (N/A) CARDIOVERSION (N/A)  Patient Location: Endoscopy Unit  Anesthesia Type:MAC  Level of Consciousness: awake and alert   Airway and Oxygen Therapy: Patient Spontanous Breathing  Post-op Pain: none  Post-op Assessment: Post-op Vital signs reviewed, Patient's Cardiovascular Status Stable, Respiratory Function Stable and Patent Airway  Post-op Vital Signs: Reviewed and stable  Last Vitals:  Filed Vitals:   03/07/14 0957  BP: 130/60  Pulse:   Temp:   Resp:     Complications: No apparent anesthesia complications

## 2014-03-11 NOTE — Addendum Note (Signed)
Addendum created 03/11/14 [id number] by Laurie Panda, MD   Modules edited: Anesthesia Attestations

## 2014-03-13 ENCOUNTER — Telehealth: Payer: Self-pay | Admitting: Cardiovascular Disease

## 2014-03-13 NOTE — Telephone Encounter (Signed)
New message      Talk to a nurse.  Pt has gained 3-4 lbs since Tuesday.  Please advise

## 2014-03-13 NOTE — Telephone Encounter (Signed)
Per wife - pts wts have been 191, 194, 195 and today 194 lbs.  She gave him 40 mg of Furosemide yesterday and this am.  His SOB is alittle better than it was while he was in the hospital however he does have bilateral ankle edema.  Advised to keep feet elevated above the level of his heart and that decreasing the NA intake in his diet will be essential to keeping him out of the hospital.  Wife states she is not adding NA to anything, rinses canned vegetables but that he is eating a sausage patty every morning and drinking Dr Scarlette Calico daily.  Advised to write down the amount of NA he is eating daily and to keep it between 1,500 and 2,000 mg a day.  Mailing pt a list of foods and there NA content.  Requested they call the Dr on call this weekend if further needs.  He has a f/u appt scheduled for next week.  Will forward to MD for review and any further recommendations.

## 2014-03-16 ENCOUNTER — Other Ambulatory Visit: Payer: Self-pay | Admitting: *Deleted

## 2014-03-16 MED ORDER — FUROSEMIDE 80 MG PO TABS: 40.0000 mg | ORAL_TABLET | Freq: Every day | ORAL | Status: DC | PRN

## 2014-03-17 ENCOUNTER — Encounter: Payer: Self-pay | Admitting: Family

## 2014-03-17 NOTE — Telephone Encounter (Signed)
Patient has f/u scheduled with Melina Copa, PA-C on 9/23

## 2014-03-18 ENCOUNTER — Encounter: Payer: Self-pay | Admitting: Family

## 2014-03-18 ENCOUNTER — Ambulatory Visit (INDEPENDENT_AMBULATORY_CARE_PROVIDER_SITE_OTHER): Payer: Medicare Other | Admitting: Family

## 2014-03-18 ENCOUNTER — Encounter: Payer: Self-pay | Admitting: Physician Assistant

## 2014-03-18 ENCOUNTER — Ambulatory Visit (INDEPENDENT_AMBULATORY_CARE_PROVIDER_SITE_OTHER): Payer: Medicare Other | Admitting: *Deleted

## 2014-03-18 ENCOUNTER — Ambulatory Visit (HOSPITAL_COMMUNITY)
Admission: RE | Admit: 2014-03-18 | Discharge: 2014-03-18 | Disposition: A | Discharge status: Home / Self Care | Payer: Medicare Other | Source: Ambulatory Visit | Attending: Family | Admitting: Family

## 2014-03-18 ENCOUNTER — Ambulatory Visit (INDEPENDENT_AMBULATORY_CARE_PROVIDER_SITE_OTHER): Payer: Medicare Other | Admitting: Physician Assistant

## 2014-03-18 VITALS — BP 160/62 | HR 58 | Ht 70.0 in | Wt 199.0 lb

## 2014-03-18 VITALS — BP 138/65 | HR 48 | Resp 14 | Ht 70.0 in | Wt 198.0 lb

## 2014-03-18 DIAGNOSIS — Z79899 Other long term (current) drug therapy: Secondary | ICD-10-CM

## 2014-03-18 DIAGNOSIS — Z48812 Encounter for surgical aftercare following surgery on the circulatory system: Secondary | ICD-10-CM | POA: Insufficient documentation

## 2014-03-18 DIAGNOSIS — I480 Paroxysmal atrial fibrillation: Secondary | ICD-10-CM

## 2014-03-18 DIAGNOSIS — I10 Essential (primary) hypertension: Secondary | ICD-10-CM | POA: Diagnosis not present

## 2014-03-18 DIAGNOSIS — I6529 Occlusion and stenosis of unspecified carotid artery: Secondary | ICD-10-CM

## 2014-03-18 DIAGNOSIS — I509 Heart failure, unspecified: Secondary | ICD-10-CM

## 2014-03-18 DIAGNOSIS — I2510 Atherosclerotic heart disease of native coronary artery without angina pectoris: Secondary | ICD-10-CM

## 2014-03-18 DIAGNOSIS — Z5181 Encounter for therapeutic drug level monitoring: Secondary | ICD-10-CM | POA: Diagnosis not present

## 2014-03-18 DIAGNOSIS — I4819 Other persistent atrial fibrillation: Secondary | ICD-10-CM

## 2014-03-18 DIAGNOSIS — N184 Chronic kidney disease, stage 4 (severe): Secondary | ICD-10-CM

## 2014-03-18 DIAGNOSIS — E039 Hypothyroidism, unspecified: Secondary | ICD-10-CM | POA: Insufficient documentation

## 2014-03-18 DIAGNOSIS — I4891 Unspecified atrial fibrillation: Secondary | ICD-10-CM | POA: Diagnosis not present

## 2014-03-18 DIAGNOSIS — I2589 Other forms of chronic ischemic heart disease: Secondary | ICD-10-CM

## 2014-03-18 DIAGNOSIS — R0602 Shortness of breath: Secondary | ICD-10-CM

## 2014-03-18 DIAGNOSIS — I5042 Chronic combined systolic (congestive) and diastolic (congestive) heart failure: Secondary | ICD-10-CM | POA: Diagnosis not present

## 2014-03-18 DIAGNOSIS — I110 Hypertensive heart disease with heart failure: Secondary | ICD-10-CM | POA: Insufficient documentation

## 2014-03-18 LAB — POCT INR: INR: 1.8

## 2014-03-18 MED ORDER — AMIODARONE HCL 200 MG PO TABS: 200.0000 mg | ORAL_TABLET | Freq: Every day | ORAL | Status: DC

## 2014-03-18 NOTE — Patient Instructions (Addendum)
Your physician has recommended you make the following change in your medication:    1. START TAKING AMIODARONE 200 MG ONCE DAILY  2. START TAKING AMLODIPINE IN THE AM    Your physician recommends that you return for lab work in:  [place] 130/80   Your physician has recommended that you have a pulmonary function test. Pulmonary Function Tests are a group of tests that measure how well air moves in and out of your lungs.

## 2014-03-18 NOTE — Progress Notes (Addendum)
[address] [place], [place]  [address] Phone: [phone] Fax:  [phone]  Date:  03/18/2014   Patient ID:  [name], [name] [birth year], MRN [id number]   PCP:  Donnajean Lopes, MD  Cardiologist:  Dr. Acie Fredrickson Vascular Physician:  Dr. Scot Dock Nephrologist:  Dr. Lorrene Reid   History of Present Illness: [name] is a 80 y.o. male with history of CAD s/p remote CABG/redo (mildly abnormal nuc 2012, cath not pursued due to worsened renal function, managed medically), CKD stage IV with fistula, HTN, HLD, PVD, recently diagnosed AF and CHF who presents for hospital f/u. He was diagnosed with atrial fibrillation in 01/2014 by Dr. Acie Fredrickson. He has a long history of bradycardia and had only been on low dose Toprol XL 25mg  for that reason. When he was diagnosed with AF, the dose was increased to 50 mg daily and he was started on Coumadin. Several weeks later he began to note increased DOE thus presented to the hospital and was diagnosed with acute on chronic combined CHF. (Note: he does have h/o AKI on CKD in 05/2013 due to overdiuresis with Cr as high as 4.4.) In the hospital, echo 03/05/2014: EF 45-50%, diffuse hypokinesis, moderate LVH, moderate AS, moderate LAE. He then underwent TEE/DCCV with successful restoration of NSR. He did have bradycardia thus Toprol was discontinued. TSH was high and free T4 was low so levothyoxine was increased to 135mcg daily. He was started on amiodarone 200mg  BID with plans to reduce to 200mg  daily. With regards to PVD, Dr. Marlou Porch last hospital note indicates "Abnormal ABI. He has seen Dr. Scot Dock. He states that although his duplex suggested a 50-69% left common iliac artery stenosis he has a good femoral pulse on the left. Although the duplex suggested a 50-69% right SFA stenosis, he has a palpable right popliteal pulse. Given that he is essentially asymptomatic I would not recommend any further workup at this time. He will continue to follow him.  Conservative management by Dr. Scot Dock" Coumadin was continued and Plavix was stopped. Cr was 2.9 at DC.  He continues to feel better since discharge. He does have chronic DOE. He seemed disheartened when I advised against eating the sausage he has for breakfast,. I educated him about lowering salt intake. No chest pain. He had to take Lasix twice last week for weight excursion into the mid 190's. Discharge weight from the hospital was 190lbs which matched their home scale. At home this AM it was 192. It is 198 in the office today but he denies any acute symptoms. (For reference, office weight 02/13/14 was 206.) He remains in NSR. His wife checks his BP morning and night and said it mostly runs 130s/60s, but she gets readings higher than that 2-3x/week.  Recent Labs: 02/13/2014: HDL Cholesterol by NMR 33.00*; LDL (calc) 87  03/04/2014: Pro B Natriuretic peptide (BNP) 4102.0*  03/05/2014: ALT 37; TSH 55.500*  03/07/2014: Creatinine 2.90*; Hemoglobin 14.8; Potassium 4.1   Wt Readings from Last 3 Encounters:  03/18/14 199 lb (90.266 kg)  03/18/14 198 lb (89.812 kg)  03/07/14 190 lb 0.6 oz (86.2 kg)     Past Medical History  Diagnosis Date  . Hypertension   . Hyperlipidemia   . Hypothyroidism   . CAD (coronary artery disease)     a. prior CABG in 1997 with redo in 2000. b. last cath in 2008 - managed medically  . Type 2 diabetes mellitus   . Generalized weakness  without overt findings  . Peripheral vascular disease   . Carotid disease, bilateral     with multiple bilateral carotid surgeries  . Psoriasis   . Meniere's disease   . Degenerative joint disease   . Gout   . Orthopnea     Two-pillow  . CKD (chronic kidney disease), stage IV     a. Has fistula in place.   . Chronic diastolic CHF (congestive heart failure)   . GERD (gastroesophageal reflux disease)   . Atrial fibrillation Aug. 2015    a. Dx 01/2014, s/p DCCV 03/06/14.  . Sinus bradycardia     a. Toprol D/C'd due to this.      Current Outpatient Prescriptions  Medication Sig Dispense Refill  . acetaminophen (TYLENOL) 500 MG tablet Take 1,000 mg by mouth 2 (two) times daily as needed for mild pain or moderate pain.       Marland Kitchen amiodarone (PACERONE) 200 MG tablet Take 1 tablet (200 mg total) by mouth 2 (two) times daily.  60 tablet  5  . amLODipine (NORVASC) 10 MG tablet Take 10 mg by mouth daily.      Marland Kitchen atorvastatin (LIPITOR) 80 MG tablet Take 80 mg by mouth at bedtime.       . cholecalciferol (VITAMIN D) 1000 UNITS tablet Take 1,000 Units by mouth daily at 6 PM.       . cloNIDine (CATAPRES) 0.2 MG tablet Take 0.2 mg by mouth 2 (two) times daily.      . fenofibrate 160 MG tablet Take 160 mg by mouth daily.      . fluticasone (FLONASE) 50 MCG/ACT nasal spray Place 2 sprays into both nostrils daily as needed for allergies.       . furosemide (LASIX) 80 MG tablet Take 0.5 tablets (40 mg total) by mouth daily as needed for fluid.  45 tablet  0  . insulin aspart (NOVOLOG FLEXPEN) 100 UNIT/ML SOPN FlexPen Inject 8 Units into the skin 2 (two) times daily.       . Insulin Glargine (LANTUS SOLOSTAR) 100 UNIT/ML SOPN Inject 22 Units into the skin daily.       . isosorbide mononitrate (IMDUR) 60 MG 24 hr tablet Take 60 mg by mouth daily.      Marland Kitchen levothyroxine (SYNTHROID, LEVOTHROID) 100 MCG tablet Take 1 tablet (100 mcg total) by mouth daily before breakfast.  30 tablet  11  . Multiple Vitamin (MULTIVITAMIN WITH MINERALS) TABS Take 1 tablet by mouth at bedtime.       . nitroGLYCERIN (NITROSTAT) 0.4 MG SL tablet Place 1 tablet (0.4 mg total) under the tongue every 5 (five) minutes as needed for chest pain.  25 tablet  3  . polyethylene glycol (MIRALAX / GLYCOLAX) packet Take 17 g by mouth daily as needed for moderate constipation.      . ranitidine (ZANTAC) 300 MG tablet Take 300 mg by mouth 2 (two) times daily.   30 tablet  11  . trolamine salicylate (ASPERCREME) 10 % cream Apply 1 application topically 2 (two) times daily as  needed for muscle pain.       Marland Kitchen warfarin (COUMADIN) 5 MG tablet Take 2.5-5 mg by mouth daily. Take 2.5 mg daily and  5 mg Thursdays       No current facility-administered medications for this visit.    Allergies:   Betadine; Iodinated diagnostic agents; and Latex   Social History:  The patient  reports that he has never smoked. He has never used  smokeless tobacco. He reports that he does not drink alcohol or use illicit drugs.   Family History:  The patient's family history includes Diabetes in his brother and mother; Heart attack in his father; Heart disease in his brother and father; Hyperlipidemia in his father; Hypertension in his father and mother.   ROS:  Please see the history of present illness.   All other systems reviewed and negative. No bleeding (we discussed warning signs). Has h/o trace BLE which he elevates his legs for.  PHYSICAL EXAM:  VS:  BP 160/62  Pulse 58  Ht 5\' 10"  (1.778 m)  Wt 199 lb (90.266 kg)  BMI 28.55 kg/m2 Well nourished, well developed WM in no acute distress HEENT: normal Neck: no JVD Cardiac:  normal S1, S2; RRR; no murmur Lungs:  clear to auscultation bilaterally, no wheezing, rhonchi or rales Abd: soft, nontender, no hepatomegaly Ext: trace BLE edema (no pedal edema though) Skin: warm and dry Neuro:  moves all extremities spontaneously, no focal abnormalities noted, hard of hearing. His wife seems to be have more knowledge of his medical history than he does  EKG:  SB 58bpm rightward axis, septal infarct age undetermined, no acute ST-T changes, QTc 449   ASSESSMENT AND PLAN:  1. Atrial fibrillation - first noted 01/2014, s/p recent TEE/DCCV due to associated development of CHF. Started on amiodarone in the hospital since he did not tolerate AF well. Will reduce dose to 200mg  daily today. Given worsened hypothyroidism in the hospital and dose change, will recheck thyroid function 6 weeks out from last check. The patient's wife was not sure if PCP  was following this and would like [place] to check it. We will share those results with his PCP. Obtain baseline PFTs. Further monitoring of other organ systems will be at the discretion of his primary cardiologist. CHADSVASC = 5 thus I suspect we will continue anticoagulation as tolerated. 2. Chronic combined systolic and diastolic CHF - weight is up in the office but down from our prior office weight, and his wife also reports relatively stable weight at home this AM. We discussed salt/fluid restriction and keeping weight in the 190-192 range at home. He has PRN Lasix to take at home and will use this judiciously given his h/o CKD - I stressed preventing fluid accumulation in the first place. 3. HTN - BP elevated in the office but home BP's mostly controlled including 138/65 in vascular office earlier today. He still has clonidine to take this evening and takes amlodipine in the evening as well. I told him I think he'd be better served taking amlodipine in the AM since he's more likely to be ambulatory and raising BP during waking hours. His wife will keep a log over the next couple days and if BP remains elevated >856 range systolic, she will call in. At that point we can decide if we should add hydralazine vs increase clonidine. He's not a candidate for CCB/BB with h/o bradycardia. A mild increase in clonidine to 0.3mg  BID may be reasonable, observing for further bradycardia. He is not on ACEI/ARB due to significant CKD. 4. CKD stage IV - recheck BMET today to ensure stability post-discharge. Has f/u with Dr. Lorrene Reid 10/5. 5. CAD s/p CABG with redo - managed medically. Not on ASA due to Coumadin use. Not on BB due to h/o sinus bradycardia. Continue statin. Lipids OK 01/2014. 6. Moderate AS - consider f/u echo in the next 6-12 months.  Dispo: Keep f/u with Dr. Acie Fredrickson 12/2  as scheduled.  Signed, Melina Copa, PA-C  03/18/2014 3:03 PM

## 2014-03-18 NOTE — Patient Instructions (Signed)
Stroke Prevention Some medical conditions and behaviors are associated with an increased chance of having a stroke. You may prevent a stroke by making healthy choices and managing medical conditions. HOW CAN I REDUCE MY RISK OF HAVING A STROKE?   Stay physically active. Get at least 30 minutes of activity on most or all days.  Do not smoke. It may also be helpful to avoid exposure to secondhand smoke.  Limit alcohol use. Moderate alcohol use is considered to be:  No more than 2 drinks per day for men.  No more than 1 drink per day for nonpregnant women.  Eat healthy foods. This involves:  Eating 5 or more servings of fruits and vegetables a day.  Making dietary changes that address high blood pressure (hypertension), high cholesterol, diabetes, or obesity.  Manage your cholesterol levels.  Making food choices that are high in fiber and low in saturated fat, trans fat, and cholesterol may control cholesterol levels.  Take any prescribed medicines to control cholesterol as directed by your health care provider.  Manage your diabetes.  Controlling your carbohydrate and sugar intake is recommended to manage diabetes.  Take any prescribed medicines to control diabetes as directed by your health care provider.  Control your hypertension.  Making food choices that are low in salt (sodium), saturated fat, trans fat, and cholesterol is recommended to manage hypertension.  Take any prescribed medicines to control hypertension as directed by your health care provider.  Maintain a healthy weight.  Reducing calorie intake and making food choices that are low in sodium, saturated fat, trans fat, and cholesterol are recommended to manage weight.  Stop drug abuse.  Avoid taking birth control pills.  Talk to your health care provider about the risks of taking birth control pills if you are over 35 years old, smoke, get migraines, or have ever had a blood clot.  Get evaluated for sleep  disorders (sleep apnea).  Talk to your health care provider about getting a sleep evaluation if you snore a lot or have excessive sleepiness.  Take medicines only as directed by your health care provider.  For some people, aspirin or blood thinners (anticoagulants) are helpful in reducing the risk of forming abnormal blood clots that can lead to stroke. If you have the irregular heart rhythm of atrial fibrillation, you should be on a blood thinner unless there is a good reason you cannot take them.  Understand all your medicine instructions.  Make sure that other conditions (such as anemia or atherosclerosis) are addressed. SEEK IMMEDIATE MEDICAL CARE IF:   You have sudden weakness or numbness of the face, arm, or leg, especially on one side of the body.  Your face or eyelid droops to one side.  You have sudden confusion.  You have trouble speaking (aphasia) or understanding.  You have sudden trouble seeing in one or both eyes.  You have sudden trouble walking.  You have dizziness.  You have a loss of balance or coordination.  You have a sudden, severe headache with no known cause.  You have new chest pain or an irregular heartbeat. Any of these symptoms may represent a serious problem that is an emergency. Do not wait to see if the symptoms will go away. Get medical help at once. Call your local emergency services (911 in [place] Do not drive yourself to the hospital. Document Released: 07/20/2004 Document Revised: 10/27/2013 Document Reviewed: 12/13/2012 [employer] Patient Information 2015 [employer]. This information is not intended to replace advice given   to you by your health care provider. Make sure you discuss any questions you have with your health care provider.  

## 2014-03-18 NOTE — Progress Notes (Signed)
Established Carotid Patient   History of Present Illness  [name] is a 80 y.o. male patient of Dr. Scot Dock who is s/p left brachiocephalic AV fistula. He is also s/p Right carotid endarterectomy 01/29/2001; Left common carotid artery stenosis repair with interposition graft 12/30/1999; Left carotid patch angioplasty 12/01/1998; Left carotid endarterectomy 04/30/1989. He returns today for carotid artery stenosis follow up. He was having some work done by his podiatrist who was concerned about his circulation. He underwent a Doppler study in April at another lab which showed an ABI of 67% on the right and 72% on the left.  He denies any history of claudication, although his activity seems fairly limited. He denies any history of rest pain. He denies any nonhealing ulcers. He is not a smoker.  With respect to his chronic kidney disease he is not on dialysis. He also remains asymptomatic from a carotid standpoint. He denies any tingling, numbness, or aching in either hand or arm.  He denies any history of stroke or TIA. Specifically he denies any history of hemiparesis, denies symptoms of aphasia, denies monocular vision loss, denies unilateral facial drooping.  Wife states he cannot have a cardiac cath done due to his degree of kidney failure, he sees his nephrologist on October 5, he is not yet on dialysis.   Pt reports New Medical or Surgical History: cardioversion early September, 2015.   Pt Diabetic: Yes, wife states is uncontrolled but his insulin is being adjusted Pt smoker: non-smoker  Pt meds include: Statin : Yes ASA: No Other anticoagulants/antiplatelets: coumadin for atrial fib   Past Medical History  Diagnosis Date  . Hypertension   . Hyperlipidemia   . Thyroid disease   . CAD (coronary artery disease)     with prior CABG in 1997 with redo in 2000; last cath in 2008 - managed medically  . Type 2 diabetes mellitus      with fair control  . Hypothyroidism   .  Generalized weakness     without overt findings  . Dehydration     Mild dehydration, resolved  . Peripheral vascular disease   . Carotid disease, bilateral     with multiple bilateral carotid surgeries  . Psoriasis   . Meniere's disease   . Degenerative joint disease   . Gout   . Orthopnea     Two-pillow  . CRI (chronic renal insufficiency)   . CHF (congestive heart failure)   . GERD (gastroesophageal reflux disease)   . Atrial fibrillation Aug. 2015    Social History History  Substance Use Topics  . Smoking status: Never Smoker   . Smokeless tobacco: Never Used  . Alcohol Use: No    Family History Family History  Problem Relation Age of Onset  . Heart attack Father   . Heart disease Father     After 85 yrs of age  . Hyperlipidemia Father   . Hypertension Father   . Diabetes Mother   . Hypertension Mother   . Heart disease Brother   . Diabetes Brother     Surgical History Past Surgical History  Procedure Laterality Date  . Coronary artery bypass graft  1997 and 2000  . Carotid endarterectomy    . Lung removal, partial    . Laminectomies  July 2001    lumbar laminectomies  . Thoracotomy       left--due to fungal infection  . Shoulder surgery    . Cardiac catheterization    . Appendectomy    .  Av fistula placement Left 01/21/2013    Procedure: ARTERIOVENOUS (AV) FISTULA CREATION, Left Brachiocephalic;  Surgeon: Angelia Mould, MD;  Location: [facility];  Service: Vascular;  Laterality: Left;  . Cardiac catheterization  2008    L main irreg, LAD 80%, IMA-LAD & SVG-Diag patent, CFX 100%, SVG-OM 90%, RCA 70%, SVG-RCA OK, EF nl, med rx, no vessels appropriate for PCI  . Tee without cardioversion N/A 03/06/2014    Procedure: TRANSESOPHAGEAL ECHOCARDIOGRAM (TEE);  Surgeon: Larey Dresser, MD;  Location: [facility];  Service: Cardiovascular;  Laterality: N/A;  . Cardioversion N/A 03/06/2014    Procedure: CARDIOVERSION;  Surgeon: Larey Dresser, MD;  Location: [facility];  Service: Cardiovascular;  Laterality: N/A;    Allergies  Allergen Reactions  . Betadine [Povidone Iodine] Rash  . Iodinated Diagnostic Agents Rash and Other (See Comments)    Does fine with premedications for cath  . Latex Hives    Current Outpatient Prescriptions  Medication Sig Dispense Refill  . acetaminophen (TYLENOL) 500 MG tablet Take 1,000 mg by mouth 2 (two) times daily as needed for mild pain or moderate pain.       Marland Kitchen amiodarone (PACERONE) 200 MG tablet Take 1 tablet (200 mg total) by mouth 2 (two) times daily.  60 tablet  5  . amLODipine (NORVASC) 10 MG tablet Take 10 mg by mouth daily.      Marland Kitchen atorvastatin (LIPITOR) 80 MG tablet Take 80 mg by mouth at bedtime.       . cholecalciferol (VITAMIN D) 1000 UNITS tablet Take 1,000 Units by mouth daily at 6 PM.       . cloNIDine (CATAPRES) 0.2 MG tablet Take 0.2 mg by mouth 2 (two) times daily.      . fenofibrate 160 MG tablet Take 160 mg by mouth daily.      . fluticasone (FLONASE) 50 MCG/ACT nasal spray Place 2 sprays into both nostrils daily as needed for allergies.       . furosemide (LASIX) 80 MG tablet Take 0.5 tablets (40 mg total) by mouth daily as needed for fluid.  45 tablet  0  . insulin aspart (NOVOLOG FLEXPEN) 100 UNIT/ML SOPN FlexPen Inject 8 Units into the skin 2 (two) times daily.       . Insulin Glargine (LANTUS SOLOSTAR) 100 UNIT/ML SOPN Inject 22 Units into the skin daily.       . isosorbide mononitrate (IMDUR) 60 MG 24 hr tablet Take 60 mg by mouth daily.      Marland Kitchen levothyroxine (SYNTHROID, LEVOTHROID) 100 MCG tablet Take 1 tablet (100 mcg total) by mouth daily before breakfast.  30 tablet  11  . Multiple Vitamin (MULTIVITAMIN WITH MINERALS) TABS Take 1 tablet by mouth at bedtime.       . nitroGLYCERIN (NITROSTAT) 0.4 MG SL tablet Place 1 tablet (0.4 mg total) under the tongue every 5 (five) minutes as needed for chest pain.  25 tablet  3  . polyethylene glycol (MIRALAX / GLYCOLAX) packet Take 17 g by mouth  daily as needed for moderate constipation.      . ranitidine (ZANTAC) 300 MG tablet Take 300 mg by mouth 2 (two) times daily.   30 tablet  11  . trolamine salicylate (ASPERCREME) 10 % cream Apply 1 application topically 2 (two) times daily as needed for muscle pain.       Marland Kitchen warfarin (COUMADIN) 5 MG tablet Take 2.5-5 mg by mouth daily. Take 2.5 mg daily and  5 mg  Thursdays       No current facility-administered medications for this visit.    Review of Systems : See HPI for pertinent positives and negatives.  Physical Examination  Filed Vitals:   03/18/14 1224  BP: 138/65  Pulse: 48  Resp: 14  Height: 5\' 10"  (1.778 m)  Weight: 198 lb (89.812 kg)  SpO2: 97%   Body mass index is 28.41 kg/(m^2).  General: WDWN male in NAD, with wife GAIT: normal Eyes: PERRLA Pulmonary:  Non-labored, CTAB, Negative  Rales, Negative rhonchi, & Negative wheezing.  Cardiac: regular Rhythm, bradycardic,  Positive detected murmur.  VASCULAR EXAM Carotid Bruits Right Left   Positive Positive    Aorta is not palpable. Radial pulses: right is 2+ palpable, left is 1+ palpable.   Left upper arm AVF has a palpable thrill.                                                                                                                         LE Pulses Right Left       FEMORAL  2+ palpable  2+ palpable        POPLITEAL  not palpable   not palpable       POSTERIOR TIBIAL  1+ palpable   not palpable        DORSALIS PEDIS      ANTERIOR TIBIAL 1+ palpable  1+ palpable     Gastrointestinal: soft, nontender, BS WNL, no r/g, reducible ventral hernia.  Musculoskeletal: Negative muscle atrophy/wasting. M/S 5/5 throughout, Extremities without ischemic changes.  Neurologic: A&O X 3; Appropriate Affect ; SENSATION ;normal;  Speech is normal CN 2-12 intact, Pain and light touch intact in extremities, Motor exam as listed above.   Non-Invasive Vascular Imaging CAROTID DUPLEX 03/18/2014   CEREBROVASCULAR  DUPLEX EVALUATION    INDICATION: Carotid stenosis    PREVIOUS INTERVENTION(S): Right carotid endarterectomy 01/29/2001; Left common carotid artery stenosis repair with interposition graft 12/30/1999; Left carotid patch angioplasty 12/01/1998; Left carotid endarterectomy 04/30/1989    DUPLEX EXAM:     RIGHT  LEFT  Peak Systolic Velocities (cm/s) End Diastolic Velocities (cm/s) Plaque LOCATION Peak Systolic Velocities (cm/s) End Diastolic Velocities (cm/s) Plaque  123 0  CCA PROXIMAL 48 11   71 0  CCA MID 81 21   126 5 HT CCA DISTAL 85 18 HT  176 0 HT ECA Occluded occluded   335 66 CP ICA PROXIMAL 101 21   50 9  ICA MID 69 18 HT  43 9  ICA DISTAL 103 17     carotid endarterectomy ICA / CCA Ratio (PSV) carotid endarterectomy   Antegrade Vertebral Flow Antegrade  161 Brachial Systolic Pressure (mmHg) ARTERIOVENOUS FISTULA  Triphasic Brachial Artery Waveforms N/A    Plaque Morphology:  HM = Homogeneous, HT = Heterogeneous, CP = Calcific Plaque, SP = Smooth Plaque, IP = Irregular Plaque     ADDITIONAL FINDINGS:     IMPRESSION: Technically difficult due to movement of carotid  artery system with respiration. Right internal carotid artery stenosis present in the 60%-79% range, calcific plaque present making Doppler interrogation difficult which may be underestimating disease. Left internal carotid artery is patent with history of carotid endarterectomy and other revascularization processes, no hemodynamically significant hyperplasia or plaque present. Left external carotid artery presents with known occlusion.    Compared to the previous exam:  Essentially unchanged since previous studies on 03/12/2013 and 03/06/2012.      Assessment: [name] is a 80 y.o. male who is s/p right carotid endarterectomy 01/29/2001, left common carotid artery stenosis repair with interposition graft 12/30/1999, left carotid patch angioplasty 12/01/1998, left carotid endarterectomy 04/30/1989, and  left brachiocephalic AV fistula. He denies any history of stroke or TIA Carotid Duplex today was technically difficult due to movement of carotid artery system with respiration. Right internal carotid artery stenosis present in the 60%-79% range, calcific plaque present making Doppler interrogation difficult which may be underestimating disease. Left internal carotid artery is patent with history of carotid endarterectomy and other revascularization processes, no hemodynamically significant hyperplasia or plaque present. Left external carotid artery presents with known occlusion. Essentially unchanged since previous studies on 03/12/2013 and 03/06/2012.   Plan: Follow-up in 6 months with Carotid Duplex scan and ABI's.   I discussed in depth with the patient the nature of atherosclerosis, and emphasized the importance of maximal medical management including strict control of blood pressure, blood glucose, and lipid levels, obtaining regular exercise, and continued cessation of smoking.  The patient is aware that without maximal medical management the underlying atherosclerotic disease process will progress, limiting the benefit of any interventions. The patient was given information about stroke prevention and what symptoms should prompt the patient to seek immediate medical care. Thank you for allowing [place] to participate in this patient's care.  Clemon Chambers, RN, MSN, FNP-C [facility] Office: [phone]  Clinic Physician: Scot Dock  03/18/2014 12:37 PM

## 2014-03-19 ENCOUNTER — Telehealth: Payer: Self-pay | Admitting: *Deleted

## 2014-03-19 LAB — BASIC METABOLIC PANEL
BUN: 43 mg/dL — AB (ref 6–23)
CO2: 28 mEq/L (ref 19–32)
Calcium: 8.9 mg/dL (ref 8.4–10.5)
Chloride: 95 mEq/L — ABNORMAL LOW (ref 96–112)
Creatinine, Ser: 2.7 mg/dL — ABNORMAL HIGH (ref 0.4–1.5)
GFR: 23.99 mL/min — ABNORMAL LOW (ref >60.00)
GLUCOSE: 324 mg/dL — AB (ref 70–99)
POTASSIUM: 4.6 meq/L (ref 3.5–5.1)
Sodium: 131 mEq/L — ABNORMAL LOW (ref 135–145)

## 2014-03-19 NOTE — Telephone Encounter (Signed)
s/w pt's wife about lab results. I advised per Melina Copa, PA pt needs to get better control of his DM. Wife said they are working on this. She asked for me to send results to Dr. Lorrene Reid and Dr. Bevelyn Buckles.

## 2014-03-23 ENCOUNTER — Emergency Department (HOSPITAL_COMMUNITY): Payer: Medicare Other

## 2014-03-23 ENCOUNTER — Encounter (HOSPITAL_COMMUNITY): Payer: Self-pay | Admitting: Emergency Medicine

## 2014-03-23 ENCOUNTER — Observation Stay (HOSPITAL_COMMUNITY)
Admission: EM | Admit: 2014-03-23 | Discharge: 2014-03-24 | Disposition: A | Discharge status: Home / Self Care | Payer: Medicare Other | Attending: Internal Medicine | Admitting: Internal Medicine

## 2014-03-23 DIAGNOSIS — X12XXXA Contact with other hot fluids, initial encounter: Secondary | ICD-10-CM | POA: Insufficient documentation

## 2014-03-23 DIAGNOSIS — X131XXA Other contact with steam and other hot vapors, initial encounter: Secondary | ICD-10-CM | POA: Diagnosis not present

## 2014-03-23 DIAGNOSIS — N184 Chronic kidney disease, stage 4 (severe): Secondary | ICD-10-CM | POA: Insufficient documentation

## 2014-03-23 DIAGNOSIS — I2510 Atherosclerotic heart disease of native coronary artery without angina pectoris: Secondary | ICD-10-CM | POA: Insufficient documentation

## 2014-03-23 DIAGNOSIS — Z794 Long term (current) use of insulin: Secondary | ICD-10-CM | POA: Insufficient documentation

## 2014-03-23 DIAGNOSIS — Z9181 History of falling: Secondary | ICD-10-CM | POA: Insufficient documentation

## 2014-03-23 DIAGNOSIS — E785 Hyperlipidemia, unspecified: Secondary | ICD-10-CM | POA: Diagnosis not present

## 2014-03-23 DIAGNOSIS — R079 Chest pain, unspecified: Secondary | ICD-10-CM | POA: Insufficient documentation

## 2014-03-23 DIAGNOSIS — W19XXXA Unspecified fall, initial encounter: Secondary | ICD-10-CM | POA: Diagnosis not present

## 2014-03-23 DIAGNOSIS — S0990XA Unspecified injury of head, initial encounter: Secondary | ICD-10-CM

## 2014-03-23 DIAGNOSIS — E039 Hypothyroidism, unspecified: Secondary | ICD-10-CM | POA: Insufficient documentation

## 2014-03-23 DIAGNOSIS — R0602 Shortness of breath: Secondary | ICD-10-CM | POA: Diagnosis not present

## 2014-03-23 DIAGNOSIS — G319 Degenerative disease of nervous system, unspecified: Secondary | ICD-10-CM | POA: Insufficient documentation

## 2014-03-23 DIAGNOSIS — Y93E1 Activity, personal bathing and showering: Secondary | ICD-10-CM | POA: Diagnosis not present

## 2014-03-23 DIAGNOSIS — I5032 Chronic diastolic (congestive) heart failure: Secondary | ICD-10-CM | POA: Insufficient documentation

## 2014-03-23 DIAGNOSIS — I129 Hypertensive chronic kidney disease with stage 1 through stage 4 chronic kidney disease, or unspecified chronic kidney disease: Secondary | ICD-10-CM | POA: Diagnosis not present

## 2014-03-23 DIAGNOSIS — T310 Burns involving less than 10% of body surface: Secondary | ICD-10-CM | POA: Insufficient documentation

## 2014-03-23 DIAGNOSIS — Z7901 Long term (current) use of anticoagulants: Secondary | ICD-10-CM | POA: Insufficient documentation

## 2014-03-23 DIAGNOSIS — R55 Syncope and collapse: Secondary | ICD-10-CM | POA: Diagnosis not present

## 2014-03-23 DIAGNOSIS — S2231XA Fracture of one rib, right side, initial encounter for closed fracture: Secondary | ICD-10-CM

## 2014-03-23 DIAGNOSIS — I10 Essential (primary) hypertension: Secondary | ICD-10-CM

## 2014-03-23 DIAGNOSIS — R1011 Right upper quadrant pain: Secondary | ICD-10-CM | POA: Diagnosis not present

## 2014-03-23 DIAGNOSIS — I4891 Unspecified atrial fibrillation: Secondary | ICD-10-CM | POA: Insufficient documentation

## 2014-03-23 DIAGNOSIS — Y92009 Unspecified place in unspecified non-institutional (private) residence as the place of occurrence of the external cause: Secondary | ICD-10-CM | POA: Diagnosis not present

## 2014-03-23 DIAGNOSIS — M25579 Pain in unspecified ankle and joints of unspecified foot: Secondary | ICD-10-CM | POA: Diagnosis not present

## 2014-03-23 DIAGNOSIS — T07XXXA Unspecified multiple injuries, initial encounter: Secondary | ICD-10-CM | POA: Diagnosis present

## 2014-03-23 DIAGNOSIS — I509 Heart failure, unspecified: Secondary | ICD-10-CM | POA: Diagnosis not present

## 2014-03-23 DIAGNOSIS — S8990XA Unspecified injury of unspecified lower leg, initial encounter: Secondary | ICD-10-CM | POA: Diagnosis not present

## 2014-03-23 DIAGNOSIS — Z951 Presence of aortocoronary bypass graft: Secondary | ICD-10-CM | POA: Insufficient documentation

## 2014-03-23 DIAGNOSIS — S3981XA Other specified injuries of abdomen, initial encounter: Secondary | ICD-10-CM | POA: Diagnosis not present

## 2014-03-23 DIAGNOSIS — E119 Type 2 diabetes mellitus without complications: Secondary | ICD-10-CM | POA: Insufficient documentation

## 2014-03-23 DIAGNOSIS — T23009A Burn of unspecified degree of unspecified hand, unspecified site, initial encounter: Principal | ICD-10-CM | POA: Insufficient documentation

## 2014-03-23 DIAGNOSIS — T24039A Burn of unspecified degree of unspecified lower leg, initial encounter: Secondary | ICD-10-CM | POA: Diagnosis not present

## 2014-03-23 DIAGNOSIS — M79609 Pain in unspecified limb: Secondary | ICD-10-CM | POA: Insufficient documentation

## 2014-03-23 DIAGNOSIS — W19XXXD Unspecified fall, subsequent encounter: Secondary | ICD-10-CM

## 2014-03-23 DIAGNOSIS — I482 Chronic atrial fibrillation, unspecified: Secondary | ICD-10-CM

## 2014-03-23 DIAGNOSIS — S2239XA Fracture of one rib, unspecified side, initial encounter for closed fracture: Secondary | ICD-10-CM | POA: Diagnosis not present

## 2014-03-23 DIAGNOSIS — S99919A Unspecified injury of unspecified ankle, initial encounter: Secondary | ICD-10-CM | POA: Diagnosis not present

## 2014-03-23 DIAGNOSIS — Z79899 Other long term (current) drug therapy: Secondary | ICD-10-CM | POA: Insufficient documentation

## 2014-03-23 DIAGNOSIS — S298XXA Other specified injuries of thorax, initial encounter: Secondary | ICD-10-CM | POA: Diagnosis not present

## 2014-03-23 DIAGNOSIS — R109 Unspecified abdominal pain: Secondary | ICD-10-CM | POA: Diagnosis not present

## 2014-03-23 LAB — CBC WITH DIFFERENTIAL/PLATELET
Basophils Absolute: 0 10*3/uL (ref 0.0–0.1)
Basophils Relative: 0 % (ref 0–1)
EOS ABS: 0.1 10*3/uL (ref 0.0–0.7)
EOS PCT: 3 % (ref 0–5)
HEMATOCRIT: 38.3 % — AB (ref 39.0–52.0)
HEMOGLOBIN: 12.8 g/dL — AB (ref 13.0–17.0)
LYMPHS PCT: 17 % (ref 12–46)
Lymphs Abs: 0.8 10*3/uL (ref 0.7–4.0)
MCH: 29.2 pg (ref 26.0–34.0)
MCHC: 33.4 g/dL (ref 30.0–36.0)
MCV: 87.2 fL (ref 78.0–100.0)
MONO ABS: 0.3 10*3/uL (ref 0.1–1.0)
MONOS PCT: 6 % (ref 3–12)
Neutro Abs: 3.7 10*3/uL (ref 1.7–7.7)
Neutrophils Relative %: 74 % (ref 43–77)
Platelets: 168 10*3/uL (ref 150–400)
RBC: 4.39 MIL/uL (ref 4.22–5.81)
RDW: 14.2 % (ref 11.5–15.5)
WBC: 4.9 10*3/uL (ref 4.0–10.5)

## 2014-03-23 LAB — COMPREHENSIVE METABOLIC PANEL
ALT: 30 U/L (ref 0–53)
AST: 43 U/L — ABNORMAL HIGH (ref 0–37)
Albumin: 3.8 g/dL (ref 3.5–5.2)
Alkaline Phosphatase: 59 U/L (ref 39–117)
Anion gap: 13 (ref 5–15)
BUN: 42 mg/dL — AB (ref 6–23)
CALCIUM: 9.1 mg/dL (ref 8.4–10.5)
CO2: 26 mEq/L (ref 19–32)
Chloride: 96 mEq/L (ref 96–112)
Creatinine, Ser: 2.53 mg/dL — ABNORMAL HIGH (ref 0.50–1.35)
GFR calc non Af Amer: 23 mL/min — ABNORMAL LOW (ref >90)
GFR, EST AFRICAN AMERICAN: 26 mL/min — AB (ref >90)
GLUCOSE: 242 mg/dL — AB (ref 70–99)
Potassium: 4.4 mEq/L (ref 3.7–5.3)
SODIUM: 135 meq/L — AB (ref 137–147)
TOTAL PROTEIN: 6.9 g/dL (ref 6.0–8.3)
Total Bilirubin: 0.6 mg/dL (ref 0.3–1.2)

## 2014-03-23 LAB — URINALYSIS, ROUTINE W REFLEX MICROSCOPIC
Bilirubin Urine: NEGATIVE
Glucose, UA: 250 mg/dL — AB
Ketones, ur: NEGATIVE mg/dL
LEUKOCYTES UA: NEGATIVE
Nitrite: NEGATIVE
PROTEIN: 100 mg/dL — AB
Specific Gravity, Urine: 1.02 (ref 1.005–1.030)
UROBILINOGEN UA: 1 mg/dL (ref 0.0–1.0)
pH: 5 (ref 5.0–8.0)

## 2014-03-23 LAB — PROTIME-INR
INR: 2.09 — ABNORMAL HIGH (ref 0.00–1.49)
Prothrombin Time: 23.5 seconds — ABNORMAL HIGH (ref 11.6–15.2)

## 2014-03-23 LAB — URINE MICROSCOPIC-ADD ON

## 2014-03-23 LAB — LIPASE, BLOOD: Lipase: 27 U/L (ref 11–59)

## 2014-03-23 MED ORDER — FENTANYL CITRATE 0.05 MG/ML IJ SOLN
50.0000 ug | Freq: Once | INTRAMUSCULAR | Status: AC
  Administered 2014-03-23: 50 ug via INTRAVENOUS
  Filled 2014-03-23: qty 2

## 2014-03-23 MED ORDER — METHYLPREDNISOLONE SODIUM SUCC 125 MG IJ SOLR
125.0000 mg | Freq: Once | INTRAMUSCULAR | Status: AC
  Administered 2014-03-23: 125 mg via INTRAVENOUS
  Filled 2014-03-23: qty 2

## 2014-03-23 MED ORDER — DIPHENHYDRAMINE HCL 50 MG/ML IJ SOLN
25.0000 mg | Freq: Once | INTRAMUSCULAR | Status: AC
Start: 2014-03-23 — End: 2014-03-23
  Administered 2014-03-23: 25 mg via INTRAVENOUS
  Filled 2014-03-23: qty 1

## 2014-03-23 NOTE — ED Notes (Signed)
Patient transported to X-ray 
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
## 2014-03-23 NOTE — ED Provider Notes (Signed)
CSN: [id number]     Arrival date & time 03/23/14  2038 History   First MD Initiated Contact with Patient 03/23/14 2051     Chief Complaint  Patient presents with  . Fall  . Rib Injury  . Burn     (Consider location/radiation/quality/duration/timing/severity/associated sxs/prior Treatment) HPI 80 year old male presents with amnesia for the event after a fall which was unwitnessed with unknown syncope or not patient was taking a shower and the last thing he remembers, he was found on the floor of the bathroom after his way for a fall, patient had amnesia for the event, patient does not recall any head trauma, patient denies headache or neck pain, patient denies any weakness or numbness, denies chest pain palpitations or shortness breath, there is no altered mental status, there is no change in speech or vision, he does however have right sided rib cage pain as well as right upper quadrant abdominal pain, he also has left ankle pain but was able to walk prior to arrival after his fall, he denies headache but does have a small red spot with slight swelling in his head he does take Coumadin. There is no treatment prior to arrival. Past Medical History  Diagnosis Date  . Hypertension   . Hyperlipidemia   . Hypothyroidism   . CAD (coronary artery disease)     a. prior CABG in 1997 with redo in 2000. b. last cath in 2008 - managed medically  . Type 2 diabetes mellitus   . Generalized weakness     without overt findings  . Peripheral vascular disease   . Carotid disease, bilateral     with multiple bilateral carotid surgeries  . Psoriasis   . Meniere's disease   . Degenerative joint disease   . Gout   . Orthopnea     Two-pillow  . CKD (chronic kidney disease), stage IV     a. Has fistula in place.   . Chronic diastolic CHF (congestive heart failure)   . GERD (gastroesophageal reflux disease)   . Atrial fibrillation Aug. 2015    a. Dx 01/2014, s/p DCCV 03/06/14.  . Sinus bradycardia      a. Toprol D/C'd due to this.    Past Surgical History  Procedure Laterality Date  . Coronary artery bypass graft  1997 and 2000  . Carotid endarterectomy    . Lung removal, partial    . Laminectomies  July 2001    lumbar laminectomies  . Thoracotomy       left--due to fungal infection  . Shoulder surgery    . Cardiac catheterization    . Appendectomy    . Av fistula placement Left 01/21/2013    Procedure: ARTERIOVENOUS (AV) FISTULA CREATION, Left Brachiocephalic;  Surgeon: Angelia Mould, MD;  Location: [place];  Service: Vascular;  Laterality: Left;  . Cardiac catheterization  2008    L main irreg, LAD 80%, IMA-LAD & SVG-Diag patent, CFX 100%, SVG-OM 90%, RCA 70%, SVG-RCA OK, EF nl, med rx, no vessels appropriate for PCI  . Tee without cardioversion N/A 03/06/2014    Procedure: TRANSESOPHAGEAL ECHOCARDIOGRAM (TEE);  Surgeon: Larey Dresser, MD;  Location: [place];  Service: Cardiovascular;  Laterality: N/A;  . Cardioversion N/A 03/06/2014    Procedure: CARDIOVERSION;  Surgeon: Larey Dresser, MD;  Location: [facility] ENDOSCOPY;  Service: Cardiovascular;  Laterality: N/A;   Family History  Problem Relation Age of Onset  . Heart attack Father   . Heart disease Father  After 45 yrs of age  . Hyperlipidemia Father   . Hypertension Father   . Diabetes Mother   . Hypertension Mother   . Heart disease Brother   . Diabetes Brother    History  Substance Use Topics  . Smoking status: Never Smoker   . Smokeless tobacco: Never Used  . Alcohol Use: No    Review of Systems 10 Systems reviewed and are negative for acute change except as noted in the HPI.   Allergies  Betadine; Iodinated diagnostic agents; and Latex  Home Medications   Prior to Admission medications   Medication Sig Start Date End Date Taking? Authorizing Provider  amiodarone (PACERONE) 200 MG tablet Take 1 tablet (200 mg total) by mouth daily. 03/18/14  Yes Dayna N Dunn, PA-C  amLODipine (NORVASC) 10 MG  tablet Take 10 mg by mouth every morning.    Yes Historical Provider, MD  atorvastatin (LIPITOR) 80 MG tablet Take 80 mg by mouth daily at 6 PM.  05/07/13  Yes Thayer Headings, MD  cholecalciferol (VITAMIN D) 1000 UNITS tablet Take 1,000 Units by mouth daily at 6 PM.    Yes Historical Provider, MD  cloNIDine (CATAPRES) 0.2 MG tablet Take 0.2 mg by mouth 2 (two) times daily. 05/08/13  Yes Thayer Headings, MD  fenofibrate 160 MG tablet Take 160 mg by mouth daily.   Yes Historical Provider, MD  insulin aspart (NOVOLOG FLEXPEN) 100 UNIT/ML SOPN FlexPen Inject 10 Units into the skin 2 (two) times daily.    Yes Historical Provider, MD  Insulin Glargine (LANTUS SOLOSTAR) 100 UNIT/ML SOPN Inject 24 Units into the skin every morning.    Yes Historical Provider, MD  isosorbide mononitrate (IMDUR) 60 MG 24 hr tablet Take 60 mg by mouth every morning.    Yes Historical Provider, MD  levothyroxine (SYNTHROID, LEVOTHROID) 100 MCG tablet Take 1 tablet (100 mcg total) by mouth daily before breakfast. 03/08/14  Yes Erlene Quan, PA-C  Multiple Vitamin (MULTIVITAMIN WITH MINERALS) TABS Take 1 tablet by mouth every evening.    Yes Historical Provider, MD  polyethylene glycol (MIRALAX / GLYCOLAX) packet Take 17 g by mouth daily as needed for moderate constipation.   Yes Historical Provider, MD  ranitidine (ZANTAC) 300 MG tablet Take 300 mg by mouth 2 (two) times daily.  09/20/10  Yes Thayer Headings, MD  trolamine salicylate (ASPERCREME) 10 % cream Apply 1 application topically 2 (two) times daily as needed for muscle pain.    Yes Historical Provider, MD  warfarin (COUMADIN) 5 MG tablet Take 2.5-5 mg by mouth daily. Takes 5mg  on Wed and Sat  Takes 2.5mg  all other days 03/07/14  Yes Erlene Quan, PA-C  furosemide (LASIX) 80 MG tablet Take 0.5 tablets (40 mg total) by mouth daily as needed for fluid. 03/16/14   Thayer Headings, MD  nitroGLYCERIN (NITROSTAT) 0.4 MG SL tablet Place 1 tablet (0.4 mg total) under the tongue  every 5 (five) minutes as needed for chest pain. 05/09/13   Thayer Headings, MD   BP 158/57  Pulse 51  Temp(Src) 97.8 F (36.6 C) (Oral)  Resp 22  Ht 5\' 10"  (1.778 m)  Wt 192 lb 0.3 oz (87.1 kg)  BMI 27.55 kg/m2  SpO2 97% Physical Exam  Nursing note and vitals reviewed. Constitutional: He is oriented to person, place, and time.  Awake, alert, nontoxic appearance. GCS 15  HENT:  Slight swelling left frontal scalp nontender  Eyes: Right eye exhibits no discharge.  Left eye exhibits no discharge.  Neck: Neck supple.  Cervical spine nontender  Cardiovascular: Normal rate and regular rhythm.   No murmur heard. Pulmonary/Chest: Effort normal and breath sounds normal. No respiratory distress. He has no wheezes. He has no rales. He exhibits tenderness.  Chest wall tenderness right lateral rib cage without flail chest appreciated without subcutaneous emphysema  Abdominal: Soft. Bowel sounds are normal. He exhibits no distension and no mass. There is tenderness. There is no rebound and no guarding.  Mild to moderate tenderness right upper quadrant with the rest of the abdomen nontender without rebound  Musculoskeletal: He exhibits tenderness. He exhibits no edema.  Baseline ROM, no obvious new focal weakness. Both arms and right leg are nontender. Left leg is no tenderness to the hip thigh knee proximal fibula or calf but does have mild tenderness to the left ankle with no tenderness to left foot with normal light touch good movement left foot.  Neurological: He is alert and oriented to person, place, and time.  Mental status and motor strength appears baseline for patient and situation.  Skin: No rash noted.  Psychiatric: He has a normal mood and affect.    ED Course  Procedures (including critical care time) d/w [facility] for Obs who will see Pt in ED; [facility] requests Cards paged since cardioversion for AF within last few weeks 0010 D/w Cards who requests [facility] to page Cards if [facility]  would like Cards to consult/see Pt. 0020 Patient / Family / Caregiver informed of clinical course, understand medical decision-making process, and agree with plan. Labs Review Labs Reviewed  PROTIME-INR - Abnormal; Notable for the following:    Prothrombin Time 23.5 (*)    INR 2.09 (*)    All other components within normal limits  CBC WITH DIFFERENTIAL - Abnormal; Notable for the following:    Hemoglobin 12.8 (*)    HCT 38.3 (*)    All other components within normal limits  COMPREHENSIVE METABOLIC PANEL - Abnormal; Notable for the following:    Sodium 135 (*)    Glucose, Bld 242 (*)    BUN 42 (*)    Creatinine, Ser 2.53 (*)    AST 43 (*)    GFR calc non Af Amer 23 (*)    GFR calc Af Amer 26 (*)    All other components within normal limits  URINALYSIS, ROUTINE W REFLEX MICROSCOPIC - Abnormal; Notable for the following:    Glucose, UA 250 (*)    Hgb urine dipstick SMALL (*)    Protein, ur 100 (*)    All other components within normal limits  COMPREHENSIVE METABOLIC PANEL - Abnormal; Notable for the following:    Sodium 134 (*)    Chloride 95 (*)    Glucose, Bld 348 (*)    BUN 45 (*)    Creatinine, Ser 2.32 (*)    GFR calc non Af Amer 25 (*)    GFR calc Af Amer 29 (*)    Anion gap 17 (*)    All other components within normal limits  CBC WITH DIFFERENTIAL - Abnormal; Notable for the following:    Neutrophils Relative % 90 (*)    Lymphocytes Relative 9 (*)    Lymphs Abs 0.4 (*)    Monocytes Relative 1 (*)    All other components within normal limits  GLUCOSE, CAPILLARY - Abnormal; Notable for the following:    Glucose-Capillary 318 (*)    All other components within normal limits  GLUCOSE,  CAPILLARY - Abnormal; Notable for the following:    Glucose-Capillary 473 (*)    All other components within normal limits  GLUCOSE, CAPILLARY - Abnormal; Notable for the following:    Glucose-Capillary 450 (*)    All other components within normal limits  GLUCOSE, CAPILLARY -  Abnormal; Notable for the following:    Glucose-Capillary 439 (*)    All other components within normal limits  LIPASE, BLOOD  URINE MICROSCOPIC-ADD ON    Imaging Review Ct Abdomen Pelvis Wo Contrast  03/23/2014   CLINICAL DATA:  Right-sided pain after a fall.  Right rib pain.  EXAM: CT ABDOMEN AND PELVIS WITHOUT CONTRAST  TECHNIQUE: Multidetector CT imaging of the abdomen and pelvis was performed following the standard protocol without IV contrast.  COMPARISON:  01/31/2008  FINDINGS: Postoperative changes in the mediastinum. Mitral valve prosthesis. Small right pleural effusion with atelectasis in the right greater than left lung bases.  The unenhanced appearance of the liver, spleen, gallbladder, pancreas, adrenal glands, inferior vena cava, and retroperitoneal lymph nodes is unremarkable. Diffuse calcification of the abdominal aorta and branch vessels. Bilateral renal atrophy, greater on the left. No hydronephrosis. Calcification in the left renal hilum is probably vascular. No renal or ureteral stones are identified. The stomach and small bowel are decompressed. Stool-filled colon without distention. No free air or free fluid in the abdomen.  Pelvis: Calcification of prostate gland without enlargement. Fat in the inguinal canals bilaterally. No free or loculated pelvic fluid collections. No pelvic mass or lymphadenopathy. Appendix is not identified. No inflammatory changes in the pelvis. Diffuse degenerative change throughout the lumbar spine. No vertebral compression deformities. No displaced fractures identified in the spine, pelvis, sacrum, or hips. Visualized lower ribs appear intact.  IMPRESSION: Small right pleural effusion with basilar atelectasis bilaterally. No evidence of acute posttraumatic change in the abdomen or pelvis on unenhanced imaging. The bilateral renal atrophy.   Electronically Signed   By: Lucienne Capers M.D.   On: 03/23/2014 23:48   Dg Chest 1 View  03/23/2014   CLINICAL  DATA:  Fall with right-sided chest pain.  EXAM: CHEST - 1 VIEW  COMPARISON:  03/04/2014 and 03/28/2013  FINDINGS: Sternotomy wires are present. Lungs are hypoinflated without focal consolidation effusion. Interval resolution of a right-sided pleural effusion. Mild stable cardiomegaly. Remainder the exam is unchanged.  IMPRESSION: No active disease.  Mild stable cardiomegaly.   Electronically Signed   By: Marin Olp M.D.   On: 03/23/2014 22:45   Dg Tibia/fibula Left  03/23/2014   CLINICAL DATA:  Fall with left lower leg pain.  EXAM: LEFT TIBIA AND FIBULA - 2 VIEW  COMPARISON:  None.  FINDINGS: Mild degenerative changes of the left knee. Mild chondrocalcinosis of the knee. No acute fracture or dislocation. Vascular calcifications are present. Surgical clips are present over the medial soft tissues.  IMPRESSION: No acute findings.   Electronically Signed   By: Marin Olp M.D.   On: 03/23/2014 22:43   Dg Ankle Complete Left  03/23/2014   CLINICAL DATA:  Fall with left ankle pain.  EXAM: LEFT ANKLE COMPLETE - 3+ VIEW  COMPARISON:  None.  FINDINGS: Ankle mortise is within normal. There is no acute fracture or dislocation. Surgical to present within the medial soft tissues of the lower leg.  IMPRESSION: No acute findings.   Electronically Signed   By: Marin Olp M.D.   On: 03/23/2014 22:42   Ct Head Wo Contrast  03/23/2014   CLINICAL DATA:  Fall  EXAM: CT HEAD WITHOUT CONTRAST  TECHNIQUE: Contiguous axial images were obtained from the base of the skull through the vertex without intravenous contrast.  COMPARISON:  01/05/2008  FINDINGS: No evidence of parenchymal hemorrhage or extra-axial fluid collection. No mass lesion, mass effect, or midline shift.  No CT evidence of acute infarction.  Subcortical white matter and periventricular small vessel ischemic changes. Intracranial atherosclerosis.  Mild age related atrophy.  No ventriculomegaly.  The visualized paranasal sinuses are essentially clear. The  mastoid air cells are unopacified.  No evidence of calvarial fracture.  IMPRESSION: No evidence of acute intracranial abnormality.  Atrophy with small vessel ischemic changes and intracranial atherosclerosis.   Electronically Signed   By: Julian Hy M.D.   On: 03/23/2014 23:42   Mr Brain Wo Contrast  03/24/2014   CLINICAL DATA:  Frequent falls.  EXAM: MRI HEAD WITHOUT CONTRAST  TECHNIQUE: Multiplanar, multiecho pulse sequences of the brain and surrounding structures were obtained without intravenous contrast.  COMPARISON:  CT head without contrast 03/23/2014.  FINDINGS: Mild atrophy and white matter disease is within normal limits for age. No acute infarct, hemorrhage, or mass lesion is present. The ventricles are proportionate to the degree of atrophy.  No significant extraaxial fluid collection is present. Flow is present in the major intracranial arteries. The globes orbits are intact. The paranasal sinuses and mastoid air cells are clear.  IMPRESSION: Normal MRI of the brain for age.   Electronically Signed   By: Lawrence Santiago M.D.   On: 03/24/2014 11:57     EKG Interpretation   Date/Time:  Monday March 23 2014 21:47:49 EDT Ventricular Rate:  49 PR Interval:  205 QRS Duration: 115 QT Interval:  491 QTC Calculation: 443 R Axis:   23 Text Interpretation:  Sinus bradycardia Probable left atrial enlargement  Incomplete right bundle branch block Anteroseptal infarct, age  indeterminate No significant change since last tracing Confirmed by [facility]   MD, Jenny Reichmann ([id number]) on 03/23/2014 9:58:05 PM      MDM   Final diagnoses:  Syncope, unspecified syncope type  Minor head injury, initial encounter  Right rib fracture, closed, initial encounter  Anticoagulation adequate with anticoagulant therapy    The patient appears reasonably stabilized for admission considering the current resources, flow, and capabilities available in the ED at this time, and I doubt any other [facility] requiring  further screening and/or treatment in the ED prior to admission.    Babette Relic, MD 03/25/14 [phone]

## 2014-03-24 ENCOUNTER — Observation Stay (HOSPITAL_COMMUNITY): Payer: Medicare Other

## 2014-03-24 DIAGNOSIS — I4891 Unspecified atrial fibrillation: Secondary | ICD-10-CM

## 2014-03-24 DIAGNOSIS — R55 Syncope and collapse: Secondary | ICD-10-CM | POA: Diagnosis not present

## 2014-03-24 DIAGNOSIS — Z5189 Encounter for other specified aftercare: Secondary | ICD-10-CM

## 2014-03-24 DIAGNOSIS — W19XXXA Unspecified fall, initial encounter: Secondary | ICD-10-CM | POA: Diagnosis present

## 2014-03-24 DIAGNOSIS — N184 Chronic kidney disease, stage 4 (severe): Secondary | ICD-10-CM

## 2014-03-24 DIAGNOSIS — S0990XA Unspecified injury of head, initial encounter: Secondary | ICD-10-CM | POA: Diagnosis not present

## 2014-03-24 DIAGNOSIS — T23009A Burn of unspecified degree of unspecified hand, unspecified site, initial encounter: Secondary | ICD-10-CM | POA: Diagnosis not present

## 2014-03-24 DIAGNOSIS — I10 Essential (primary) hypertension: Secondary | ICD-10-CM

## 2014-03-24 LAB — COMPREHENSIVE METABOLIC PANEL
ALT: 30 U/L (ref 0–53)
AST: 36 U/L (ref 0–37)
Albumin: 4.1 g/dL (ref 3.5–5.2)
Alkaline Phosphatase: 53 U/L (ref 39–117)
Anion gap: 17 — ABNORMAL HIGH (ref 5–15)
BUN: 45 mg/dL — ABNORMAL HIGH (ref 6–23)
CALCIUM: 9.2 mg/dL (ref 8.4–10.5)
CO2: 22 meq/L (ref 19–32)
CREATININE: 2.32 mg/dL — AB (ref 0.50–1.35)
Chloride: 95 mEq/L — ABNORMAL LOW (ref 96–112)
GFR calc non Af Amer: 25 mL/min — ABNORMAL LOW (ref >90)
GFR, EST AFRICAN AMERICAN: 29 mL/min — AB (ref >90)
GLUCOSE: 348 mg/dL — AB (ref 70–99)
Potassium: 4.6 mEq/L (ref 3.7–5.3)
Sodium: 134 mEq/L — ABNORMAL LOW (ref 137–147)
Total Bilirubin: 0.8 mg/dL (ref 0.3–1.2)
Total Protein: 6.9 g/dL (ref 6.0–8.3)

## 2014-03-24 LAB — CBC WITH DIFFERENTIAL/PLATELET
Basophils Absolute: 0 10*3/uL (ref 0.0–0.1)
Basophils Relative: 0 % (ref 0–1)
EOS ABS: 0 10*3/uL (ref 0.0–0.7)
Eosinophils Relative: 0 % (ref 0–5)
HCT: 40.6 % (ref 39.0–52.0)
Hemoglobin: 13.9 g/dL (ref 13.0–17.0)
LYMPHS ABS: 0.4 10*3/uL — AB (ref 0.7–4.0)
LYMPHS PCT: 9 % — AB (ref 12–46)
MCH: 29.8 pg (ref 26.0–34.0)
MCHC: 34.2 g/dL (ref 30.0–36.0)
MCV: 86.9 fL (ref 78.0–100.0)
Monocytes Absolute: 0.1 10*3/uL (ref 0.1–1.0)
Monocytes Relative: 1 % — ABNORMAL LOW (ref 3–12)
NEUTROS ABS: 4.1 10*3/uL (ref 1.7–7.7)
NEUTROS PCT: 90 % — AB (ref 43–77)
PLATELETS: 165 10*3/uL (ref 150–400)
RBC: 4.67 MIL/uL (ref 4.22–5.81)
RDW: 14.1 % (ref 11.5–15.5)
WBC: 4.6 10*3/uL (ref 4.0–10.5)

## 2014-03-24 LAB — GLUCOSE, CAPILLARY
GLUCOSE-CAPILLARY: 318 mg/dL — AB (ref 70–99)
GLUCOSE-CAPILLARY: 450 mg/dL — AB (ref 70–99)
Glucose-Capillary: 473 mg/dL — ABNORMAL HIGH (ref 70–99)
Glucose-Capillary: 439 mg/dL — ABNORMAL HIGH (ref 70–99)

## 2014-03-24 MED ORDER — POLYETHYLENE GLYCOL 3350 17 G PO PACK
17.0000 g | PACK | Freq: Every day | ORAL | Status: DC | PRN
  Filled 2014-03-24: qty 1

## 2014-03-24 MED ORDER — LEVOTHYROXINE SODIUM 100 MCG PO TABS
100.0000 ug | ORAL_TABLET | Freq: Every day | ORAL | Status: DC
  Administered 2014-03-24: 100 ug via ORAL
  Filled 2014-03-24 (×2): qty 1

## 2014-03-24 MED ORDER — WARFARIN SODIUM 2.5 MG PO TABS
2.5000 mg | ORAL_TABLET | ORAL | Status: DC
  Filled 2014-03-24: qty 1

## 2014-03-24 MED ORDER — VITAMIN D3 25 MCG (1000 UNIT) PO TABS
1000.0000 [IU] | ORAL_TABLET | Freq: Every day | ORAL | Status: DC
  Filled 2014-03-24: qty 1

## 2014-03-24 MED ORDER — INSULIN GLARGINE 100 UNIT/ML ~~LOC~~ SOLN
24.0000 [IU] | Freq: Every morning | SUBCUTANEOUS | Status: DC
  Administered 2014-03-24: 24 [IU] via SUBCUTANEOUS
  Filled 2014-03-24: qty 0.24

## 2014-03-24 MED ORDER — INSULIN ASPART 100 UNIT/ML ~~LOC~~ SOLN
0.0000 [IU] | Freq: Three times a day (TID) | SUBCUTANEOUS | Status: DC
  Administered 2014-03-24: 12 [IU] via SUBCUTANEOUS

## 2014-03-24 MED ORDER — INSULIN ASPART 100 UNIT/ML ~~LOC~~ SOLN
0.0000 [IU] | Freq: Three times a day (TID) | SUBCUTANEOUS | Status: DC
  Administered 2014-03-24: 11 [IU] via SUBCUTANEOUS

## 2014-03-24 MED ORDER — FENOFIBRATE 160 MG PO TABS
160.0000 mg | ORAL_TABLET | Freq: Every day | ORAL | Status: DC
  Administered 2014-03-24: 160 mg via ORAL
  Filled 2014-03-24: qty 1

## 2014-03-24 MED ORDER — CETYLPYRIDINIUM CHLORIDE 0.05 % MT LIQD
7.0000 mL | Freq: Two times a day (BID) | OROMUCOSAL | Status: DC
  Administered 2014-03-24: 7 mL via OROMUCOSAL

## 2014-03-24 MED ORDER — SODIUM CHLORIDE 0.9 % IV SOLN
INTRAVENOUS | Status: DC
  Administered 2014-03-24: 03:00:00 via INTRAVENOUS

## 2014-03-24 MED ORDER — FAMOTIDINE 40 MG PO TABS
40.0000 mg | ORAL_TABLET | Freq: Every day | ORAL | Status: DC
  Administered 2014-03-24: 40 mg via ORAL
  Filled 2014-03-24: qty 1

## 2014-03-24 MED ORDER — WARFARIN - PHARMACIST DOSING INPATIENT: Freq: Every day | Status: DC

## 2014-03-24 MED ORDER — ISOSORBIDE MONONITRATE ER 60 MG PO TB24
60.0000 mg | ORAL_TABLET | Freq: Every morning | ORAL | Status: DC
  Administered 2014-03-24: 60 mg via ORAL
  Filled 2014-03-24: qty 1

## 2014-03-24 MED ORDER — CLONIDINE HCL 0.2 MG PO TABS
0.2000 mg | ORAL_TABLET | Freq: Two times a day (BID) | ORAL | Status: DC
  Administered 2014-03-24: 0.2 mg via ORAL
  Filled 2014-03-24 (×3): qty 1

## 2014-03-24 MED ORDER — INSULIN ASPART 100 UNIT/ML ~~LOC~~ SOLN: 0.0000 [IU] | Freq: Every day | SUBCUTANEOUS | Status: DC

## 2014-03-24 MED ORDER — ADULT MULTIVITAMIN W/MINERALS CH
1.0000 | ORAL_TABLET | Freq: Every evening | ORAL | Status: DC
  Filled 2014-03-24: qty 1

## 2014-03-24 MED ORDER — AMIODARONE HCL 200 MG PO TABS
200.0000 mg | ORAL_TABLET | Freq: Every day | ORAL | Status: DC
  Administered 2014-03-24: 200 mg via ORAL
  Filled 2014-03-24: qty 1

## 2014-03-24 MED ORDER — TROLAMINE SALICYLATE 10 % EX CREA: 1.0000 "application " | TOPICAL_CREAM | Freq: Two times a day (BID) | CUTANEOUS | Status: DC | PRN

## 2014-03-24 MED ORDER — ATORVASTATIN CALCIUM 80 MG PO TABS
80.0000 mg | ORAL_TABLET | Freq: Every day | ORAL | Status: DC
  Filled 2014-03-24: qty 1

## 2014-03-24 MED ORDER — HYDROCODONE-ACETAMINOPHEN 5-325 MG PO TABS
1.0000 | ORAL_TABLET | Freq: Four times a day (QID) | ORAL | Status: DC | PRN
  Administered 2014-03-24: 1 via ORAL
  Filled 2014-03-24: qty 1

## 2014-03-24 MED ORDER — WARFARIN SODIUM 5 MG PO TABS: 5.0000 mg | ORAL_TABLET | ORAL | Status: DC

## 2014-03-24 MED ORDER — NITROGLYCERIN 0.4 MG SL SUBL: 0.4000 mg | SUBLINGUAL_TABLET | SUBLINGUAL | Status: DC | PRN

## 2014-03-24 MED ORDER — AMLODIPINE BESYLATE 10 MG PO TABS
10.0000 mg | ORAL_TABLET | Freq: Every morning | ORAL | Status: DC
Start: 2014-03-24 — End: 2014-03-24
  Administered 2014-03-24: 10 mg via ORAL
  Filled 2014-03-24: qty 1

## 2014-03-24 NOTE — Progress Notes (Signed)
MD made aware of pt's CBG result of 318. Orders received.

## 2014-03-24 NOTE — Progress Notes (Signed)
ANTICOAGULATION CONSULT NOTE - Initial Consult  Pharmacy Consult for Warfarin  Indication: atrial fibrillation  Allergies  Allergen Reactions  . Betadine [Povidone Iodine] Rash  . Iodinated Diagnostic Agents Rash and Other (See Comments)    Does fine with premedications for cath  . Latex Hives    Patient Measurements:    Vital Signs: Temp: 97.2 F (36.2 C) (09/28 2044) Temp src: Oral (09/28 2044) BP: 185/60 mmHg (09/28 2200) Pulse Rate: 58 (09/28 2200)  Labs:  Recent Labs  03/23/14 2118  HGB 12.8*  HCT 38.3*  PLT 168  LABPROT 23.5*  INR 2.09*  CREATININE 2.53*    The CrCl is unknown because both a height and weight (above a minimum accepted value) are required for this calculation.   Medical History: Past Medical History  Diagnosis Date  . Hypertension   . Hyperlipidemia   . Hypothyroidism   . CAD (coronary artery disease)     a. prior CABG in 1997 with redo in 2000. b. last cath in 2008 - managed medically  . Type 2 diabetes mellitus   . Generalized weakness     without overt findings  . Peripheral vascular disease   . Carotid disease, bilateral     with multiple bilateral carotid surgeries  . Psoriasis   . Meniere's disease   . Degenerative joint disease   . Gout   . Orthopnea     Two-pillow  . CKD (chronic kidney disease), stage IV     a. Has fistula in place.   . Chronic diastolic CHF (congestive heart failure)   . GERD (gastroesophageal reflux disease)   . Atrial fibrillation Aug. 2015    a. Dx 01/2014, s/p DCCV 03/06/14.  . Sinus bradycardia     a. Toprol D/C'd due to this.     Medications:   (Not in a hospital admission)  Assessment: 66 YOM with h/o Afib x/p TEE cardioversion on 03/06/14 was brought to the ED after being found down out of the shower. Pharmacy consulted to resume warfarin therapy. INR on admission was therapeutic at 2.09. Other labs as above. Per med rec, last dose of warfarin was on 9/28.   Home warfarin dose 5 mg on Wed  and Sat; 2.5 mg on all other days   Goal of Therapy:  INR 2-3 Monitor platelets by anticoagulation protocol: Yes   Plan:  1) Resume home coumadin dose  2) Monitor daily PT/INR and s/s of bleeding   Albertina Parr, PharmD.  Clinical Pharmacist Pager [phone]

## 2014-03-24 NOTE — Progress Notes (Signed)
MD notified of patient's CBG 439. Order okay to discharge.

## 2014-03-24 NOTE — Progress Notes (Signed)
Received pt report from Matt,RN-ED.
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
## 2014-03-24 NOTE — Progress Notes (Signed)
[name] to be D/C'd Home per MD order.  Discussed with the patient and all questions fully answered.    Medication List         amiodarone 200 MG tablet  Commonly known as:  PACERONE  Take 1 tablet (200 mg total) by mouth daily.     amLODipine 10 MG tablet  Commonly known as:  NORVASC  Take 10 mg by mouth every morning.     atorvastatin 80 MG tablet  Commonly known as:  LIPITOR  Take 80 mg by mouth daily at 6 PM.     cholecalciferol 1000 UNITS tablet  Commonly known as:  VITAMIN D  Take 1,000 Units by mouth daily at 6 PM.     cloNIDine 0.2 MG tablet  Commonly known as:  CATAPRES  Take 0.2 mg by mouth 2 (two) times daily.     fenofibrate 160 MG tablet  Take 160 mg by mouth daily.     furosemide 80 MG tablet  Commonly known as:  LASIX  Take 0.5 tablets (40 mg total) by mouth daily as needed for fluid.     isosorbide mononitrate 60 MG 24 hr tablet  Commonly known as:  IMDUR  Take 60 mg by mouth every morning.     LANTUS SOLOSTAR 100 UNIT/ML Solostar Pen  Generic drug:  Insulin Glargine  Inject 24 Units into the skin every morning.     levothyroxine 100 MCG tablet  Commonly known as:  SYNTHROID, LEVOTHROID  Take 1 tablet (100 mcg total) by mouth daily before breakfast.     multivitamin with minerals Tabs tablet  Take 1 tablet by mouth every evening.     nitroGLYCERIN 0.4 MG SL tablet  Commonly known as:  NITROSTAT  Place 1 tablet (0.4 mg total) under the tongue every 5 (five) minutes as needed for chest pain.     NOVOLOG FLEXPEN 100 UNIT/ML FlexPen  Generic drug:  insulin aspart  Inject 10 Units into the skin 2 (two) times daily.     polyethylene glycol packet  Commonly known as:  MIRALAX / GLYCOLAX  Take 17 g by mouth daily as needed for moderate constipation.     ranitidine 300 MG tablet  Commonly known as:  ZANTAC  Take 300 mg by mouth 2 (two) times daily.     trolamine salicylate 10 % cream  Commonly known as:  ASPERCREME  Apply 1 application  topically 2 (two) times daily as needed for muscle pain.     warfarin 5 MG tablet  Commonly known as:  COUMADIN  - Take 2.5-5 mg by mouth daily. Takes 5mg  on Wed and Sat   - Takes 2.5mg  all other days        VVS, Skin clean, dry and intact without evidence of skin break down, no evidence of skin tears noted. IV catheter discontinued intact. Site without signs and symptoms of complications. Dressing and pressure applied.  An After Visit Summary was printed and given to the patient.  D/c education completed with patient/family including follow up instructions, medication list, d/c activities limitations if indicated, with other d/c instructions as indicated by MD - patient able to verbalize understanding, all questions fully answered.   Patient instructed to return to ED, call 911, or call MD for any changes in condition.   Patient escorted via [place], and D/C home via private auto.  Delman Cheadle 03/24/2014 2:47 PM

## 2014-03-24 NOTE — ED Notes (Signed)
Attempted report x1. 

## 2014-03-24 NOTE — H&P (Addendum)
Hospitalist Admission History and Physical  Patient name: [name] Medical record number: [id number] Date of birth: 04/28/1934 Age: 80 y.o. Gender: male  Primary Care Provider: Donnajean Lopes, MD  Chief Complaint: fall, ? Pre-syncope  History of Present Illness:This is a 80 y.o. year old male with significant past medical history of atrial fibrillation s/p TEE cardioversion 03/06/14, chronic bradycardia, CAD s/p CABG, stage 4 CKD, IDDM, HTN presenting with fall, ? Presyncope. Initial story was that pt was found down out of the shower. There was reported amnesia about event per EDP. However, in discussing case with pt, he remembers everything that happened and denies any LOC. Pt states that he was in the shoower when he cut the cold water off. He then felt the hot water as it was extremely hot and fell out of the shower trying to avoid it. Denies any direct head trauma. Wife states that she did not witness fall, but instead heard pt scream from bathroom. Wife states that she found pt on the floor of the bathroom alert and at baseline.  On presentation to the ER, T 97.2, HR 40s-50s, Resp 10s-20s, BP 160s-180s/50s-60s, Satting 98% on RA. WBC 4.9, Hgb 12.8, Cr 2.53, BUN 42, INR 2.09, Glu 242 . EKG sinus bradycardia. Multilple imaging studies including head CT,  L ankle, L tib/fib, CXR WNL. CT abd and pelvis shows small R pleural effusions with bibasilar atelectasis. Pt denies any CP, SOB, hemiparesis, confusion, LOC prior to incident.   Assessment and Plan: [name] is a 80 y.o. year old male presenting with Fall   Active Problems:   Fall   1-Fall  -will observe overnight (after discussion with pt and wife, he feels more comfortable being watched overnight) -no fractures noted  -mild dysarthria on exam-unclear if this is baseline-check MRI brain -noted fairly significant prior cardiac history-tele monitoring -mild-moderate R rib pain on exam-cont to follow-pain control  2-  afib/CAD/bradycardia/HTN/mixed CHF  -fairly extensive prior cardiac history as noted above -s/p TEE CV 03/06/14 for afib w/ CHF-EKG today- sinus bradycardia  -no current cardiac complaints  -clinically euvolemic at baseline -continue home regimen -tele monitoring -cont anticoagulation-no overt signs of bleeding currently s/p fall  -consider repeat CT chest- if any resp distress/hypoxia ensues given small R pleural effusion on CT imaging -cards consult as needed   3-IDDM -SSI, lantus  -A1C  4-CKD -stage 5 chronically s/p AV fistula placement  -at baseline on presentation -continue to follow  FEN/GI: heart healthy/carb modified  Prophylaxis: coumadin Disposition: pending furhter evaluation  Code Status:Full Code    Patient Active Problem List   Diagnosis Date Noted  . Fall 03/24/2014  . Aftercare following surgery of the circulatory system, [place] 03/18/2014  . Chronic combined systolic and diastolic CHF (congestive heart failure) 03/18/2014  . Essential hypertension 03/18/2014  . CKD (chronic kidney disease), stage IV 03/18/2014  . Hypothyroidism 03/18/2014  . Encounter for therapeutic drug monitoring 02/19/2014  . Atrial fibrillation 02/13/2014  . Peripheral vascular disease, unspecified 11/12/2013  . Occlusion and stenosis of carotid artery without mention of cerebral infarction 03/06/2012  . Dyspnea 02/13/2012  . Hyperlipidemia 08/30/2011  . CORONARY ARTERY DISEASE 06/19/2007  . DM w/o Complication Type II 14/48/1856   Past Medical History: Past Medical History  Diagnosis Date  . Hypertension   . Hyperlipidemia   . Hypothyroidism   . CAD (coronary artery disease)     a. prior CABG in 1997 with redo in 2000. b. last cath in 2008 -  managed medically  . Type 2 diabetes mellitus   . Generalized weakness     without overt findings  . Peripheral vascular disease   . Carotid disease, bilateral     with multiple bilateral carotid surgeries  . Psoriasis   . Meniere's  disease   . Degenerative joint disease   . Gout   . Orthopnea     Two-pillow  . CKD (chronic kidney disease), stage IV     a. Has fistula in place.   . Chronic diastolic CHF (congestive heart failure)   . GERD (gastroesophageal reflux disease)   . Atrial fibrillation Aug. 2015    a. Dx 01/2014, s/p DCCV 03/06/14.  . Sinus bradycardia     a. Toprol D/C'd due to this.     Past Surgical History: Past Surgical History  Procedure Laterality Date  . Coronary artery bypass graft  1997 and 2000  . Carotid endarterectomy    . Lung removal, partial    . Laminectomies  July 2001    lumbar laminectomies  . Thoracotomy       left--due to fungal infection  . Shoulder surgery    . Cardiac catheterization    . Appendectomy    . Av fistula placement Left 01/21/2013    Procedure: ARTERIOVENOUS (AV) FISTULA CREATION, Left Brachiocephalic;  Surgeon: Angelia Mould, MD;  Location: [facility];  Service: Vascular;  Laterality: Left;  . Cardiac catheterization  2008    L main irreg, LAD 80%, IMA-LAD & SVG-Diag patent, CFX 100%, SVG-OM 90%, RCA 70%, SVG-RCA OK, EF nl, med rx, no vessels appropriate for PCI  . Tee without cardioversion N/A 03/06/2014    Procedure: TRANSESOPHAGEAL ECHOCARDIOGRAM (TEE);  Surgeon: Larey Dresser, MD;  Location: [facility];  Service: Cardiovascular;  Laterality: N/A;  . Cardioversion N/A 03/06/2014    Procedure: CARDIOVERSION;  Surgeon: Larey Dresser, MD;  Location: [facility] ENDOSCOPY;  Service: Cardiovascular;  Laterality: N/A;    Social History: History   Social History  . Marital Status: Married    Spouse Name: N/A    Number of Children: N/A  . Years of Education: N/A   Occupational History  . Retired    Social History Main Topics  . Smoking status: Never Smoker   . Smokeless tobacco: Never Used  . Alcohol Use: No  . Drug Use: No  . Sexual Activity: Not Currently   Other Topics Concern  . None   Social History Narrative  . None    Family  History: Family History  Problem Relation Age of Onset  . Heart attack Father   . Heart disease Father     After 36 yrs of age  . Hyperlipidemia Father   . Hypertension Father   . Diabetes Mother   . Hypertension Mother   . Heart disease Brother   . Diabetes Brother     Allergies: Allergies  Allergen Reactions  . Betadine [Povidone Iodine] Rash  . Iodinated Diagnostic Agents Rash and Other (See Comments)    Does fine with premedications for cath  . Latex Hives    Current Facility-Administered Medications  Medication Dose Route Frequency Provider Last Rate Last Dose  . 0.9 %  sodium chloride infusion   Intravenous Continuous Shanda Howells, MD       Current Outpatient Prescriptions  Medication Sig Dispense Refill  . amiodarone (PACERONE) 200 MG tablet Take 1 tablet (200 mg total) by mouth daily.  60 tablet  5  . amLODipine (NORVASC) 10 MG  tablet Take 10 mg by mouth every morning.       Marland Kitchen atorvastatin (LIPITOR) 80 MG tablet Take 80 mg by mouth daily at 6 PM.       . cholecalciferol (VITAMIN D) 1000 UNITS tablet Take 1,000 Units by mouth daily at 6 PM.       . cloNIDine (CATAPRES) 0.2 MG tablet Take 0.2 mg by mouth 2 (two) times daily.      . fenofibrate 160 MG tablet Take 160 mg by mouth daily.      . insulin aspart (NOVOLOG FLEXPEN) 100 UNIT/ML SOPN FlexPen Inject 10 Units into the skin 2 (two) times daily.       . Insulin Glargine (LANTUS SOLOSTAR) 100 UNIT/ML SOPN Inject 24 Units into the skin every morning.       . isosorbide mononitrate (IMDUR) 60 MG 24 hr tablet Take 60 mg by mouth every morning.       Marland Kitchen levothyroxine (SYNTHROID, LEVOTHROID) 100 MCG tablet Take 1 tablet (100 mcg total) by mouth daily before breakfast.  30 tablet  11  . Multiple Vitamin (MULTIVITAMIN WITH MINERALS) TABS Take 1 tablet by mouth every evening.       . polyethylene glycol (MIRALAX / GLYCOLAX) packet Take 17 g by mouth daily as needed for moderate constipation.      . ranitidine (ZANTAC) 300 MG  tablet Take 300 mg by mouth 2 (two) times daily.   30 tablet  11  . trolamine salicylate (ASPERCREME) 10 % cream Apply 1 application topically 2 (two) times daily as needed for muscle pain.       Marland Kitchen warfarin (COUMADIN) 5 MG tablet Take 2.5-5 mg by mouth daily. Takes 5mg  on Wed and Sat  Takes 2.5mg  all other days      . furosemide (LASIX) 80 MG tablet Take 0.5 tablets (40 mg total) by mouth daily as needed for fluid.  45 tablet  0  . nitroGLYCERIN (NITROSTAT) 0.4 MG SL tablet Place 1 tablet (0.4 mg total) under the tongue every 5 (five) minutes as needed for chest pain.  25 tablet  3   Review Of Systems: 12 point ROS negative except as noted above in HPI.  Physical Exam: Filed Vitals:   03/23/14 2200  BP: 185/60  Pulse: 58  Temp:   Resp: 20    General: alert and cooperative HEENT: PERRLA and extra ocular movement intact Heart: S1, S2 normal, no murmur, rub or gallop, regular rate and rhythm Lungs: clear to auscultation, no wheezes or rales and unlabored breathing, ild R sided rib pain  Abdomen: abdomen is soft without significant tenderness, masses, organomegaly or guarding Extremities: extremities normal, atraumatic, no cyanosis or edema Skin:no rashes Neurology: normal without focal findings  Labs and Imaging: Lab Results  Component Value Date/Time   NA 135* 03/23/2014  9:18 PM   K 4.4 03/23/2014  9:18 PM   CL 96 03/23/2014  9:18 PM   CO2 26 03/23/2014  9:18 PM   BUN 42* 03/23/2014  9:18 PM   CREATININE 2.53* 03/23/2014  9:18 PM   GLUCOSE 242* 03/23/2014  9:18 PM   Lab Results  Component Value Date   WBC 4.9 03/23/2014   HGB 12.8* 03/23/2014   HCT 38.3* 03/23/2014   MCV 87.2 03/23/2014   PLT 168 03/23/2014    Ct Abdomen Pelvis Wo Contrast  03/23/2014   CLINICAL DATA:  Right-sided pain after a fall.  Right rib pain.  EXAM: CT ABDOMEN AND PELVIS WITHOUT CONTRAST  TECHNIQUE: Multidetector CT imaging of the abdomen and pelvis was performed following the standard protocol without IV  contrast.  COMPARISON:  01/31/2008  FINDINGS: Postoperative changes in the mediastinum. Mitral valve prosthesis. Small right pleural effusion with atelectasis in the right greater than left lung bases.  The unenhanced appearance of the liver, spleen, gallbladder, pancreas, adrenal glands, inferior vena cava, and retroperitoneal lymph nodes is unremarkable. Diffuse calcification of the abdominal aorta and branch vessels. Bilateral renal atrophy, greater on the left. No hydronephrosis. Calcification in the left renal hilum is probably vascular. No renal or ureteral stones are identified. The stomach and small bowel are decompressed. Stool-filled colon without distention. No free air or free fluid in the abdomen.  Pelvis: Calcification of prostate gland without enlargement. Fat in the inguinal canals bilaterally. No free or loculated pelvic fluid collections. No pelvic mass or lymphadenopathy. Appendix is not identified. No inflammatory changes in the pelvis. Diffuse degenerative change throughout the lumbar spine. No vertebral compression deformities. No displaced fractures identified in the spine, pelvis, sacrum, or hips. Visualized lower ribs appear intact.  IMPRESSION: Small right pleural effusion with basilar atelectasis bilaterally. No evidence of acute posttraumatic change in the abdomen or pelvis on unenhanced imaging. The bilateral renal atrophy.   Electronically Signed   By: Lucienne Capers M.D.   On: 03/23/2014 23:48   Dg Chest 1 View  03/23/2014   CLINICAL DATA:  Fall with right-sided chest pain.  EXAM: CHEST - 1 VIEW  COMPARISON:  03/04/2014 and 03/28/2013  FINDINGS: Sternotomy wires are present. Lungs are hypoinflated without focal consolidation effusion. Interval resolution of a right-sided pleural effusion. Mild stable cardiomegaly. Remainder the exam is unchanged.  IMPRESSION: No active disease.  Mild stable cardiomegaly.   Electronically Signed   By: Marin Olp M.D.   On: 03/23/2014 22:45    Dg Tibia/fibula Left  03/23/2014   CLINICAL DATA:  Fall with left lower leg pain.  EXAM: LEFT TIBIA AND FIBULA - 2 VIEW  COMPARISON:  None.  FINDINGS: Mild degenerative changes of the left knee. Mild chondrocalcinosis of the knee. No acute fracture or dislocation. Vascular calcifications are present. Surgical clips are present over the medial soft tissues.  IMPRESSION: No acute findings.   Electronically Signed   By: Marin Olp M.D.   On: 03/23/2014 22:43   Dg Ankle Complete Left  03/23/2014   CLINICAL DATA:  Fall with left ankle pain.  EXAM: LEFT ANKLE COMPLETE - 3+ VIEW  COMPARISON:  None.  FINDINGS: Ankle mortise is within normal. There is no acute fracture or dislocation. Surgical to present within the medial soft tissues of the lower leg.  IMPRESSION: No acute findings.   Electronically Signed   By: Marin Olp M.D.   On: 03/23/2014 22:42   Ct Head Wo Contrast  03/23/2014   CLINICAL DATA:  Fall  EXAM: CT HEAD WITHOUT CONTRAST  TECHNIQUE: Contiguous axial images were obtained from the base of the skull through the vertex without intravenous contrast.  COMPARISON:  01/05/2008  FINDINGS: No evidence of parenchymal hemorrhage or extra-axial fluid collection. No mass lesion, mass effect, or midline shift.  No CT evidence of acute infarction.  Subcortical white matter and periventricular small vessel ischemic changes. Intracranial atherosclerosis.  Mild age related atrophy.  No ventriculomegaly.  The visualized paranasal sinuses are essentially clear. The mastoid air cells are unopacified.  No evidence of calvarial fracture.  IMPRESSION: No evidence of acute intracranial abnormality.  Atrophy with small vessel ischemic changes and intracranial  atherosclerosis.   Electronically Signed   By: Julian Hy M.D.   On: 03/23/2014 23:42           Shanda Howells MD  Pager: [phone]

## 2014-03-24 NOTE — Progress Notes (Signed)
Dr. Wynelle Cleveland paged for blood sugar result of 450. 12:51 PM Telephone order to give 12 units of SS insulin now.

## 2014-03-24 NOTE — Progress Notes (Signed)
UR completed 

## 2014-03-24 NOTE — ED Notes (Signed)
MD at bedside. 

## 2014-03-24 NOTE — Progress Notes (Signed)
Pt's BP 192/58, HR 60. No complaints of pain or symptoms. MD notified, aware and plan to come see patient. No new orders received.

## 2014-03-24 NOTE — Progress Notes (Signed)
Nutrition Brief Note  Patient identified on the Malnutrition Screening Tool (MST) Report  Wt Readings from Last 15 Encounters:  03/24/14 192 lb 0.3 oz (87.1 kg)  03/18/14 199 lb (90.266 kg)  03/18/14 198 lb (89.812 kg)  03/07/14 190 lb 0.6 oz (86.2 kg)  03/07/14 190 lb 0.6 oz (86.2 kg)  02/13/14 206 lb 6.4 oz (93.622 kg)  11/12/13 202 lb 11.2 oz (91.944 kg)  08/08/13 200 lb (90.719 kg)  05/29/13 201 lb 11.2 oz (91.491 kg)  05/08/13 208 lb (94.348 kg)  03/12/13 204 lb (92.534 kg)  01/21/13 201 lb 8 oz (91.4 kg)  01/21/13 201 lb 8 oz (91.4 kg)  01/15/13 208 lb 1.6 oz (94.394 kg)  09/27/12 206 lb (93.441 kg)   80 y.o. year old male with significant past medical history of atrial fibrillation s/p TEE cardioversion 03/06/14, chronic bradycardia, CAD s/p CABG, stage 4 CKD, IDDM, HTN presenting with fall, ? Presyncope.  Spoke with pt and wife who report anticipated d/c home today. The both confirm good appetite presently and PTA. Wife reports weight loss is due to "fluid being drawn off him". They deny further nutrition needs and questions at this time.  Body mass index is 27.55 kg/(m^2). Patient meets criteria for overweight based on current BMI.   Current diet order is Heart Healthy/ Carb Modified, patient is consuming approximately 100% of meals at this time. Labs and medications reviewed.   No nutrition interventions warranted at this time. If nutrition issues arise, please consult RD.   Tadeo Besecker A. Jimmye Norman, RD, LDN Pager: [phone] After hours Pager: [phone]

## 2014-03-25 ENCOUNTER — Ambulatory Visit (INDEPENDENT_AMBULATORY_CARE_PROVIDER_SITE_OTHER): Payer: Medicare Other | Admitting: *Deleted

## 2014-03-25 DIAGNOSIS — Z5181 Encounter for therapeutic drug level monitoring: Secondary | ICD-10-CM

## 2014-03-25 DIAGNOSIS — I4819 Other persistent atrial fibrillation: Secondary | ICD-10-CM

## 2014-03-25 DIAGNOSIS — I4891 Unspecified atrial fibrillation: Secondary | ICD-10-CM

## 2014-03-25 LAB — POCT INR: INR: 2.1

## 2014-03-25 NOTE — Care Management Note (Signed)
    Page 1 of 1   03/25/2014     10:08:01 AM CARE MANAGEMENT NOTE 03/25/2014  Patient:  [name], [name]   Account Number:  [id number]  Date Initiated:  03/25/2014  Documentation initiated by:  Tomi Bamberger  Subjective/Objective Assessment:   dx fall  admit- from home.     Action/Plan:   Anticipated DC Date:  03/24/2014   Anticipated DC Plan:  [place]  CM consult      Choice offered to / List presented to:             Status of service:  Completed, signed off Medicare Important Message given?  NO (If response is "NO", the following Medicare IM given date fields will be blank) Date Medicare IM given:   Medicare IM given by:   Date Additional Medicare IM given:   Additional Medicare IM given by:    Discharge Disposition:  HOME/SELF CARE  Per UR Regulation:  Reviewed for med. necessity/level of care/duration of stay  If discussed at [place] of Stay Meetings, dates discussed:    Comments:

## 2014-03-30 ENCOUNTER — Encounter: Payer: Self-pay | Admitting: Podiatry

## 2014-03-30 ENCOUNTER — Ambulatory Visit (INDEPENDENT_AMBULATORY_CARE_PROVIDER_SITE_OTHER): Payer: Medicare Other | Admitting: Podiatry

## 2014-03-30 VITALS — BP 113/48 | HR 50 | Temp 97.1°F | Resp 14 | Ht 70.0 in | Wt 190.0 lb

## 2014-03-30 DIAGNOSIS — I770 Arteriovenous fistula, acquired: Secondary | ICD-10-CM | POA: Diagnosis not present

## 2014-03-30 DIAGNOSIS — L97511 Non-pressure chronic ulcer of other part of right foot limited to breakdown of skin: Secondary | ICD-10-CM | POA: Diagnosis not present

## 2014-03-30 DIAGNOSIS — M79676 Pain in unspecified toe(s): Secondary | ICD-10-CM

## 2014-03-30 DIAGNOSIS — I2589 Other forms of chronic ischemic heart disease: Secondary | ICD-10-CM

## 2014-03-30 DIAGNOSIS — D649 Anemia, unspecified: Secondary | ICD-10-CM | POA: Diagnosis not present

## 2014-03-30 DIAGNOSIS — B351 Tinea unguium: Secondary | ICD-10-CM | POA: Diagnosis not present

## 2014-03-30 DIAGNOSIS — I129 Hypertensive chronic kidney disease with stage 1 through stage 4 chronic kidney disease, or unspecified chronic kidney disease: Secondary | ICD-10-CM | POA: Diagnosis not present

## 2014-03-30 DIAGNOSIS — N184 Chronic kidney disease, stage 4 (severe): Secondary | ICD-10-CM | POA: Diagnosis not present

## 2014-03-30 MED ORDER — AMOXICILLIN-POT CLAVULANATE 875-125 MG PO TABS
1.0000 | ORAL_TABLET | Freq: Two times a day (BID) | ORAL | Status: DC
Start: 2014-03-30 — End: 2014-04-21

## 2014-03-30 MED ORDER — SILVER SULFADIAZINE 1 % EX CREA: 1.0000 "application " | TOPICAL_CREAM | Freq: Every day | CUTANEOUS | Status: DC

## 2014-03-30 NOTE — Progress Notes (Signed)
   Subjective:    Patient ID: [name], male    DOB: 11-20-1933, 80 y.o.   MRN: [id number]  HPI Comments: N diabetic ulcer L left dorsal 1st toe D 2 months O  C ulcer  A diabetes T hospitalized for Afib and was treated with Bacitracin.  Pt requests trimming of 10 toenails and right 5th MPJ callous.   patient describes a superficial skin lesion on the dorsal aspect left foot present for approximately 2 months that was diagnosed as an ulcer in the hospital. His treatment has been application of bacitracin ointment to the area daily with some slight reduction in the redness around the site.  You also presents today requesting debridement of uncomfortable toenails    Review of Systems  Cardiovascular:       Currently undergoing therapy for AFib.  Musculoskeletal:       Recovering from fractured ribs.  All other systems reviewed and are negative.      Objective:   Physical Exam  Orientated x3 white male presents with his wife  Vascular: DP R0/4 bilaterally PT right 0/4 PT left 1/4 (Patient known history of peripheral arterial disease under treatment by Dr. Doren Custard)  Neurological: Ankle reflex equal and reactive bilaterally Vibratory sensation nonreactive bilaterally Sensation to 10 g monofilament wire intact 5/5 right and 3/5 left  Dermatological: 10 mm superficial ulcer with honey crusted serous drainage surrounded by low-grade erythema. No malodor or warmth noted surrounding this area.  The toenails are elongated, incurvated, discolored 6-10  Musculoskeletal: Hallux interphalangeus right      Assessment & Plan:   Assessment: Peripheral arterial disease Diabetic peripheral neuropathy Superficial skin ulceration left hallux with low-grade cellulitis Symptomatic onychomycoses 6-10  Plan: Rx Silvadene cream to apply to skin ulcer daily and cover with gauze Rx Augmentin 870 10/25/2023 #20 twice a day Dispensed surgical shoe to wear and left  foot  Debrided toenails x10 without a bleeding  Reappoint x10 days

## 2014-03-30 NOTE — Patient Instructions (Signed)
Apply Silvadene cream to the left great toe skin ulcer daily and cover with gauze Wear surgical shoe on left foot Begin Augmentin 870/125 twice a day

## 2014-04-06 ENCOUNTER — Ambulatory Visit (INDEPENDENT_AMBULATORY_CARE_PROVIDER_SITE_OTHER): Payer: Medicare Other | Admitting: Internal Medicine

## 2014-04-06 DIAGNOSIS — Z79899 Other long term (current) drug therapy: Secondary | ICD-10-CM

## 2014-04-06 LAB — PULMONARY FUNCTION TEST
DL/VA % pred: 109 %
DL/VA: 4.87 ml/min/mmHg/L
DLCO unc % pred: 71 %
DLCO unc: 21.1 ml/min/mmHg
FEF 25-75 POST: 1.14 L/s
FEF 25-75 PRE: 0.89 L/s
FEF2575-%CHANGE-POST: 27 %
FEF2575-%PRED-POST: 60 %
FEF2575-%Pred-Pre: 47 %
FEV1-%CHANGE-POST: 4 %
FEV1-%PRED-PRE: 54 %
FEV1-%Pred-Post: 56 %
FEV1-Post: 1.53 L
FEV1-Pre: 1.46 L
FEV1FVC-%Change-Post: 1 %
FEV1FVC-%PRED-PRE: 100 %
FEV6-%Change-Post: 11 %
FEV6-%PRED-POST: 59 %
FEV6-%Pred-Pre: 53 %
FEV6-Post: 2.09 L
FEV6-Pre: 1.87 L
FEV6FVC-%Change-Post: 8 %
FEV6FVC-%Pred-Post: 107 %
FEV6FVC-%Pred-Pre: 99 %
FVC-%Change-Post: 3 %
FVC-%Pred-Post: 55 %
FVC-%Pred-Pre: 53 %
FVC-Post: 2.09 L
FVC-Pre: 2.03 L
POST FEV1/FVC RATIO: 73 %
Post FEV6/FVC ratio: 100 %
Pre FEV1/FVC ratio: 72 %
Pre FEV6/FVC Ratio: 92 %
RV % pred: 88 %
RV: 2.22 L
TLC % pred: 59 %
TLC: 3.99 L

## 2014-04-06 NOTE — Progress Notes (Signed)
PFT done today. 

## 2014-04-07 ENCOUNTER — Telehealth: Payer: Self-pay

## 2014-04-07 ENCOUNTER — Other Ambulatory Visit: Payer: Self-pay

## 2014-04-07 DIAGNOSIS — R0609 Other forms of dyspnea: Principal | ICD-10-CM

## 2014-04-07 NOTE — Telephone Encounter (Signed)
Called to inform patient to continue taking Amiodarone for now. Informed Mrs. Ohms that a Pulmonary referral has been ordered for Mr. Halbur.

## 2014-04-08 ENCOUNTER — Other Ambulatory Visit (HOSPITAL_COMMUNITY): Payer: Self-pay | Admitting: General Surgery

## 2014-04-08 ENCOUNTER — Encounter (HOSPITAL_BASED_OUTPATIENT_CLINIC_OR_DEPARTMENT_OTHER): Payer: Medicare Other | Attending: General Surgery

## 2014-04-08 ENCOUNTER — Ambulatory Visit (HOSPITAL_COMMUNITY)
Admission: RE | Admit: 2014-04-08 | Discharge: 2014-04-08 | Disposition: A | Discharge status: Home / Self Care | Payer: Medicare Other | Source: Ambulatory Visit | Attending: General Surgery | Admitting: General Surgery

## 2014-04-08 ENCOUNTER — Ambulatory Visit: Payer: Medicare Other | Admitting: Podiatry

## 2014-04-08 DIAGNOSIS — L97522 Non-pressure chronic ulcer of other part of left foot with fat layer exposed: Secondary | ICD-10-CM | POA: Insufficient documentation

## 2014-04-08 DIAGNOSIS — M949 Disorder of cartilage, unspecified: Secondary | ICD-10-CM | POA: Diagnosis not present

## 2014-04-08 DIAGNOSIS — E11621 Type 2 diabetes mellitus with foot ulcer: Secondary | ICD-10-CM | POA: Diagnosis not present

## 2014-04-08 DIAGNOSIS — M869 Osteomyelitis, unspecified: Secondary | ICD-10-CM

## 2014-04-08 DIAGNOSIS — M85872 Other specified disorders of bone density and structure, left ankle and foot: Secondary | ICD-10-CM | POA: Insufficient documentation

## 2014-04-09 ENCOUNTER — Telehealth: Payer: Self-pay | Admitting: *Deleted

## 2014-04-09 ENCOUNTER — Ambulatory Visit (INDEPENDENT_AMBULATORY_CARE_PROVIDER_SITE_OTHER): Payer: Medicare Other | Admitting: Pharmacist

## 2014-04-09 DIAGNOSIS — Z5181 Encounter for therapeutic drug level monitoring: Secondary | ICD-10-CM | POA: Diagnosis not present

## 2014-04-09 DIAGNOSIS — I4891 Unspecified atrial fibrillation: Secondary | ICD-10-CM | POA: Diagnosis not present

## 2014-04-09 DIAGNOSIS — I481 Persistent atrial fibrillation: Secondary | ICD-10-CM

## 2014-04-09 DIAGNOSIS — I4819 Other persistent atrial fibrillation: Secondary | ICD-10-CM

## 2014-04-09 LAB — POCT INR: INR: 2

## 2014-04-09 NOTE — Telephone Encounter (Signed)
s/w pt's wife today and both pt and wife aware pt to continue on amiodarone.

## 2014-04-09 NOTE — H&P (Signed)
[name], [name] NO.:  [id number]  MEDICAL RECORD NO.:  [id number]  LOCATION:                                 FACILITY:  PHYSICIAN:  Elesa Hacker, M.D.        DATE OF BIRTH:  March 19, 1934  DATE OF ADMISSION:  04/08/2014 DATE OF DISCHARGE:  04/08/2014                             HISTORY & PHYSICAL   CHIEF COMPLAINT:  Wound on left great toe.  HISTORY OF PRESENT ILLNESS:  This is a 80 year old male has type 2 diabetes, with marked peripheral neuropathy.  In addition, he has a long history of peripheral vascular and cardiovascular disease including CABG and bilateral carotid endarterectomies.  This wound has been present for several months and has been treated with Silvadene ointment.  PAST MEDICAL HISTORY:  Significant for, 1. Atrial fibrillation. 2. Coronary artery disease. 3. Diabetes. 4. Hypertension. 5. Chronic bradycardia. 6. Congestive heart failure. 7. Hypothyroidism. 8. Gout. 9. Peripheral neuropathy. 10.Peripheral vascular disease. 11.Hyperlipidemia. 12.GERD.  PAST SURGICAL HISTORY: 1. CABG in 1997. 2. Bilateral carotid endarterectomies. 3. Multiple back surgeries. 4. Knee surgery. 5. Redo CABG in 2002.  CIGARETTES:  None.  ALCOHOL:  None.  ALLERGIES:  Betadine and latex.  MEDICATIONS:  Augmentin, Norvasc, amiodarone, Lipitor, clonidine, fenofibrate, furosemide, insulin, Synthroid, nitroglycerin, MiraLax, Zantac, and Coumadin.  REVIEW OF SYSTEMS:  Essentially as above.  PHYSICAL EXAMINATION:  VITAL SIGNS:  Temperature 98.1, pulse 48 and regular, respirations 18, blood pressure 122/50.  Glucose is 106. CHEST:  Clear. HEART:  Appears to be in regular rhythm at present. EXTREMITIES:  On the dorsum of the great toe, there is an 1.0 x 1.1 x 0.1 ulceration covered with slough.  There is some surrounding redness. Distal pulses not palpable.  A previously measured ABI was 0.74 in the vascular lab in April.  IMPRESSION:  Diabetic foot  ulcer probably Wagner 2, associated peripheral vascular disease.  The patient has a healing treatment.  We will start with Santyl and Hydrogel.  The patient has a healing sandal. We will ask for vascular to reassess.  We will see the patient in 7 days.     Elesa Hacker, M.D.     RA/MEDQ  D:  04/08/2014  T:  04/09/2014  Job:  [id number]

## 2014-04-15 DIAGNOSIS — L97522 Non-pressure chronic ulcer of other part of left foot with fat layer exposed: Secondary | ICD-10-CM | POA: Diagnosis not present

## 2014-04-15 DIAGNOSIS — E11621 Type 2 diabetes mellitus with foot ulcer: Secondary | ICD-10-CM | POA: Diagnosis not present

## 2014-04-15 DIAGNOSIS — I10 Essential (primary) hypertension: Secondary | ICD-10-CM | POA: Diagnosis not present

## 2014-04-15 DIAGNOSIS — Z6829 Body mass index (BMI) 29.0-29.9, adult: Secondary | ICD-10-CM | POA: Diagnosis not present

## 2014-04-15 DIAGNOSIS — E1165 Type 2 diabetes mellitus with hyperglycemia: Secondary | ICD-10-CM | POA: Diagnosis not present

## 2014-04-17 ENCOUNTER — Other Ambulatory Visit: Payer: Self-pay | Admitting: *Deleted

## 2014-04-17 DIAGNOSIS — L97521 Non-pressure chronic ulcer of other part of left foot limited to breakdown of skin: Secondary | ICD-10-CM

## 2014-04-18 ENCOUNTER — Other Ambulatory Visit: Payer: Self-pay | Admitting: Cardiovascular Disease

## 2014-04-21 ENCOUNTER — Ambulatory Visit (INDEPENDENT_AMBULATORY_CARE_PROVIDER_SITE_OTHER): Payer: Medicare Other | Admitting: Pulmonary Disease

## 2014-04-21 ENCOUNTER — Encounter: Payer: Self-pay | Admitting: Pulmonary Disease

## 2014-04-21 VITALS — BP 122/58 | HR 50 | Temp 97.1°F | Ht 70.0 in | Wt 199.4 lb

## 2014-04-21 DIAGNOSIS — R0609 Other forms of dyspnea: Secondary | ICD-10-CM | POA: Diagnosis not present

## 2014-04-21 NOTE — Patient Instructions (Signed)
Will start on spiriva 2 inhalations each am for the next one month.  Please call me at the end of the 4 week trial to let me know how things are going.  Work on weight reduction and conditioning.  It would be worthwhile to consider a referral to the cardiac rehab program at the hospital.  You can discuss with your cardiologist.

## 2014-04-21 NOTE — Progress Notes (Signed)
   Subjective:    Patient ID: [name], male    DOB: [birth year], 80 y.o.   MRN: [id number]  HPI The patient is a 80 year old male who I've been asked to see for dyspnea on exertion. He has known significant underlying cardiac disease, and was recently discharged from the hospital with acute on chronic congestive heart failure and atrial fibrillation. He feels that his breathing is definitely better since discharge. He has had breathing issues for years, but feels they have been progressive this year. He describes a one block dyspnea on exertion at a moderate pace on flat ground, and will get winded walking up one flight of stairs. He has no significant cough, but does have chronic lower extremity edema. He has no history of asthma or other pulmonary issues in the past, and has never smoked. He is on chronic anticoagulation and amiodarone for atrial fibrillation, but his chest x-ray does not show any acute infiltrate. He did have a significant right pleural effusion that resolved after his hospitalization. He is felt to have chronic diastolic and systolic heart failure as well as moderate aortic valvular disease. He also has stage IV chronic kidney disease. The patient has had recent pulmonary function studies that show a decreased FVC and FEV1 in a normal proportion, but does have subtle evidence for airflow obstruction by air trapping on his lung volumes and an abnormal flow volume loop. He also has mild restriction that I suspect is related to his weight, and a mildly reduced DLCO.   Review of Systems  Constitutional: Negative for fever and unexpected weight change.  HENT: Negative for congestion, dental problem, ear pain, nosebleeds, postnasal drip, rhinorrhea, sinus pressure, sneezing, sore throat and trouble swallowing.   Eyes: Negative for redness and itching.  Respiratory: Positive for shortness of breath. Negative for cough, chest tightness and wheezing.   Cardiovascular: Positive for  palpitations. Negative for leg swelling.  Gastrointestinal: Negative for nausea and vomiting.  Genitourinary: Negative for dysuria.  Musculoskeletal: Positive for joint swelling.  Skin: Negative for rash.  Neurological: Negative for headaches.  Hematological: Does not bruise/bleed easily.  Psychiatric/Behavioral: Negative for dysphoric mood. The patient is not nervous/anxious.        Objective:   Physical Exam Constitutional:  Well developed, no acute distress  HENT:  Nares patent without discharge  Oropharynx without exudate, palate and uvula are normal  Eyes:  Perrla, eomi, no scleral icterus  Neck:  No JVD, no TMG  Cardiovascular:  Normal rate, regular rhythm, no rubs or gallops.  3/6 blowing systolic murmur        Intact distal pulses but decreased  Pulmonary :  Normal breath sounds, no stridor or respiratory distress   No rhonchi, or wheezing, +mild basilar crackles.  Abdominal:  Soft, nondistended, bowel sounds present.  No tenderness noted.   Musculoskeletal:  mild lower extremity edema noted.  Lymph Nodes:  No cervical lymphadenopathy noted  Skin:  No cyanosis noted  Neurologic:  Alert, appropriate, moves all 4 extremities without obvious deficit.         Assessment & Plan:

## 2014-04-21 NOTE — Assessment & Plan Note (Signed)
The patient has mild airflow obstruction on pulmonary function studies manifested primarily as air trapping on lung volumes. His FEV1 percent is actually normal, and his flow volume loop is suggestive of airflow limitation. I think it is worth giving him a trial of a bronchodilator regimen, but I suspect it is not going to have a significant impact. The patient's dyspnea is multifactorial, with significant underlying cardiac disease, as well as being overweight and deconditioned.  Will start on Spiriva for the next 4 weeks, and the patient is to call us with an update. If he does not see a significant improvement, I see no reason to keep him on a maintenance bronchodilator.

## 2014-04-22 DIAGNOSIS — G309 Alzheimer's disease, unspecified: Secondary | ICD-10-CM | POA: Diagnosis not present

## 2014-04-22 DIAGNOSIS — R269 Unspecified abnormalities of gait and mobility: Secondary | ICD-10-CM | POA: Diagnosis not present

## 2014-04-22 DIAGNOSIS — E1151 Type 2 diabetes mellitus with diabetic peripheral angiopathy without gangrene: Secondary | ICD-10-CM | POA: Diagnosis not present

## 2014-04-22 DIAGNOSIS — N184 Chronic kidney disease, stage 4 (severe): Secondary | ICD-10-CM | POA: Diagnosis not present

## 2014-04-22 DIAGNOSIS — I2510 Atherosclerotic heart disease of native coronary artery without angina pectoris: Secondary | ICD-10-CM | POA: Diagnosis not present

## 2014-04-22 DIAGNOSIS — E11621 Type 2 diabetes mellitus with foot ulcer: Secondary | ICD-10-CM | POA: Diagnosis not present

## 2014-04-22 DIAGNOSIS — I739 Peripheral vascular disease, unspecified: Secondary | ICD-10-CM | POA: Diagnosis not present

## 2014-04-22 DIAGNOSIS — I10 Essential (primary) hypertension: Secondary | ICD-10-CM | POA: Diagnosis not present

## 2014-04-22 DIAGNOSIS — L97522 Non-pressure chronic ulcer of other part of left foot with fat layer exposed: Secondary | ICD-10-CM | POA: Diagnosis not present

## 2014-04-29 ENCOUNTER — Other Ambulatory Visit (INDEPENDENT_AMBULATORY_CARE_PROVIDER_SITE_OTHER): Payer: Medicare Other | Admitting: *Deleted

## 2014-04-29 ENCOUNTER — Encounter (HOSPITAL_BASED_OUTPATIENT_CLINIC_OR_DEPARTMENT_OTHER): Payer: Medicare Other | Attending: General Surgery

## 2014-04-29 ENCOUNTER — Ambulatory Visit (INDEPENDENT_AMBULATORY_CARE_PROVIDER_SITE_OTHER): Payer: Medicare Other | Admitting: Pharmacist

## 2014-04-29 DIAGNOSIS — I481 Persistent atrial fibrillation: Secondary | ICD-10-CM | POA: Diagnosis not present

## 2014-04-29 DIAGNOSIS — L97529 Non-pressure chronic ulcer of other part of left foot with unspecified severity: Secondary | ICD-10-CM | POA: Insufficient documentation

## 2014-04-29 DIAGNOSIS — E039 Hypothyroidism, unspecified: Secondary | ICD-10-CM | POA: Diagnosis not present

## 2014-04-29 DIAGNOSIS — I4891 Unspecified atrial fibrillation: Secondary | ICD-10-CM | POA: Diagnosis not present

## 2014-04-29 DIAGNOSIS — Z5181 Encounter for therapeutic drug level monitoring: Secondary | ICD-10-CM

## 2014-04-29 DIAGNOSIS — E11621 Type 2 diabetes mellitus with foot ulcer: Secondary | ICD-10-CM | POA: Insufficient documentation

## 2014-04-29 DIAGNOSIS — I4819 Other persistent atrial fibrillation: Secondary | ICD-10-CM

## 2014-04-29 LAB — POCT INR: INR: 1.8

## 2014-04-30 LAB — T4, FREE: FREE T4: 0.98 ng/dL (ref 0.60–1.60)

## 2014-04-30 LAB — TSH: TSH: 17.06 u[IU]/mL — AB (ref 0.35–4.50)

## 2014-05-01 ENCOUNTER — Telehealth: Payer: Self-pay | Admitting: *Deleted

## 2014-05-01 NOTE — Telephone Encounter (Signed)
Pt gave verbal permission to me to s/w wife about results. Pt's wife notified about lab results and recommendations.

## 2014-05-02 ENCOUNTER — Other Ambulatory Visit: Payer: Self-pay | Admitting: Cardiovascular Disease

## 2014-05-06 DIAGNOSIS — L97529 Non-pressure chronic ulcer of other part of left foot with unspecified severity: Secondary | ICD-10-CM | POA: Diagnosis not present

## 2014-05-06 DIAGNOSIS — E11621 Type 2 diabetes mellitus with foot ulcer: Secondary | ICD-10-CM | POA: Diagnosis not present

## 2014-05-11 DIAGNOSIS — N185 Chronic kidney disease, stage 5: Secondary | ICD-10-CM | POA: Diagnosis not present

## 2014-05-11 DIAGNOSIS — Z6829 Body mass index (BMI) 29.0-29.9, adult: Secondary | ICD-10-CM | POA: Diagnosis not present

## 2014-05-11 DIAGNOSIS — I2510 Atherosclerotic heart disease of native coronary artery without angina pectoris: Secondary | ICD-10-CM | POA: Diagnosis not present

## 2014-05-11 DIAGNOSIS — E1151 Type 2 diabetes mellitus with diabetic peripheral angiopathy without gangrene: Secondary | ICD-10-CM | POA: Diagnosis not present

## 2014-05-11 DIAGNOSIS — I739 Peripheral vascular disease, unspecified: Secondary | ICD-10-CM | POA: Diagnosis not present

## 2014-05-11 DIAGNOSIS — E039 Hypothyroidism, unspecified: Secondary | ICD-10-CM | POA: Diagnosis not present

## 2014-05-11 DIAGNOSIS — I872 Venous insufficiency (chronic) (peripheral): Secondary | ICD-10-CM | POA: Diagnosis not present

## 2014-05-13 DIAGNOSIS — E11621 Type 2 diabetes mellitus with foot ulcer: Secondary | ICD-10-CM | POA: Diagnosis not present

## 2014-05-13 DIAGNOSIS — L97529 Non-pressure chronic ulcer of other part of left foot with unspecified severity: Secondary | ICD-10-CM | POA: Diagnosis not present

## 2014-05-14 ENCOUNTER — Ambulatory Visit (INDEPENDENT_AMBULATORY_CARE_PROVIDER_SITE_OTHER): Payer: Medicare Other | Admitting: *Deleted

## 2014-05-14 DIAGNOSIS — I481 Persistent atrial fibrillation: Secondary | ICD-10-CM | POA: Diagnosis not present

## 2014-05-14 DIAGNOSIS — I4891 Unspecified atrial fibrillation: Secondary | ICD-10-CM | POA: Diagnosis not present

## 2014-05-14 DIAGNOSIS — Z5181 Encounter for therapeutic drug level monitoring: Secondary | ICD-10-CM | POA: Diagnosis not present

## 2014-05-14 DIAGNOSIS — I4819 Other persistent atrial fibrillation: Secondary | ICD-10-CM

## 2014-05-14 LAB — POCT INR: INR: 2.3

## 2014-05-18 ENCOUNTER — Other Ambulatory Visit: Payer: Self-pay | Admitting: Cardiovascular Disease

## 2014-05-20 ENCOUNTER — Other Ambulatory Visit: Payer: Self-pay | Admitting: *Deleted

## 2014-05-26 ENCOUNTER — Encounter: Payer: Self-pay | Admitting: Vascular Surgery

## 2014-05-27 ENCOUNTER — Ambulatory Visit (INDEPENDENT_AMBULATORY_CARE_PROVIDER_SITE_OTHER): Payer: Medicare Other | Admitting: Vascular Surgery

## 2014-05-27 ENCOUNTER — Encounter: Payer: Self-pay | Admitting: Cardiovascular Disease

## 2014-05-27 ENCOUNTER — Ambulatory Visit (INDEPENDENT_AMBULATORY_CARE_PROVIDER_SITE_OTHER): Payer: Medicare Other | Admitting: Cardiovascular Disease

## 2014-05-27 ENCOUNTER — Ambulatory Visit (HOSPITAL_COMMUNITY)
Admission: RE | Admit: 2014-05-27 | Discharge: 2014-05-27 | Disposition: A | Discharge status: Home / Self Care | Payer: Medicare Other | Source: Ambulatory Visit | Attending: Vascular Surgery | Admitting: Vascular Surgery

## 2014-05-27 ENCOUNTER — Encounter: Payer: Self-pay | Admitting: Vascular Surgery

## 2014-05-27 VITALS — BP 152/62 | HR 57 | Resp 14 | Ht 70.0 in | Wt 203.0 lb

## 2014-05-27 VITALS — BP 148/70 | HR 58 | Ht 70.0 in | Wt 203.4 lb

## 2014-05-27 DIAGNOSIS — I25810 Atherosclerosis of coronary artery bypass graft(s) without angina pectoris: Secondary | ICD-10-CM | POA: Diagnosis not present

## 2014-05-27 DIAGNOSIS — L98499 Non-pressure chronic ulcer of skin of other sites with unspecified severity: Secondary | ICD-10-CM

## 2014-05-27 DIAGNOSIS — I739 Peripheral vascular disease, unspecified: Secondary | ICD-10-CM

## 2014-05-27 DIAGNOSIS — I2589 Other forms of chronic ischemic heart disease: Secondary | ICD-10-CM

## 2014-05-27 DIAGNOSIS — I5042 Chronic combined systolic (congestive) and diastolic (congestive) heart failure: Secondary | ICD-10-CM

## 2014-05-27 DIAGNOSIS — L97521 Non-pressure chronic ulcer of other part of left foot limited to breakdown of skin: Secondary | ICD-10-CM | POA: Diagnosis not present

## 2014-05-27 DIAGNOSIS — I4891 Unspecified atrial fibrillation: Secondary | ICD-10-CM | POA: Diagnosis not present

## 2014-05-27 DIAGNOSIS — I70209 Unspecified atherosclerosis of native arteries of extremities, unspecified extremity: Secondary | ICD-10-CM

## 2014-05-27 NOTE — Assessment & Plan Note (Signed)
Continue amiodarone. 

## 2014-05-27 NOTE — Assessment & Plan Note (Signed)
He is not having any significant CP or dyspnea.  Continue current meds.

## 2014-05-27 NOTE — Progress Notes (Signed)
Problem list:  1. Coronary artery disease 2. Cut chronic kidney disease 3. Atrophic leg 4. Hypothyroidism    [name] Date of Birth  06/02/1934  [facility] [address]    [place], [place]  [address] [phone]  Fax  [phone]  History of Present Illness:  [name] is a 80 year old gentleman with a history of coronary artery disease and coronary artery bypass grafting. He recently presented with some right-sided shoulder pain that radiates through to his chest. He had a stress Myoview study which revealed a small inferolateral defect. He was set up for cardiac catheterization but his precath labs revealed a creatinine of 2.4 which was new. His baseline creatinine is between 1.6 and 1.8. We discontinued his HCTZ and his ACE inhibitor.  He was seen by his medical doctor who also stop his metformin.  We were going to do a cardiac catheterization this winter but we canceled the case because of his renal insufficiency. His metformin was stopped and he made some other medical changes in his creatinine has now improved. Fortunately, he's not having any further episodes of angina.  He informed me that his insurance company is no longer paying for Ranexa.  He's been bradycardic for years. We do not have him on beta blocker for that reason.  He's had some shoulder problems. He needs to have surgery on his right shoulder soon. He will eventually need to have surgery on his left shoulder. He had a low risk  Myoview study in November, 2012. He had an echocardiogram in August, 2013 that was also unchanged. He does not follow a strict diabetic diet.  His glucose levels went very high after his steroid injection ( > 500)  September 27, 2012:  Pt is doing well.  He denies any chest pain. Has some shoulder soreness.  He does have chronic dyspnea and has had worsening dyspnea since he had the flu in January.  He stays away from salt.  Nov. 13, 2014;  He contniues to have  dyspnea an CP.  He did not take a NTG.  He typically gets CP with exertion.   He is short of breath at rest and with exertion.  He seems to minimize his symptoms - wife thinks he has more shortness of breath that he admits to.    i was asked to see him again by Dr. Lorrene Reid for worsening dyspnea.  He avoids  salt.   Echo showed LVEF of 65-70% with LVH, .mild aortic stenosis  Feb. 13, 2015:  Pt is feeling well.  No angina.  Kidney function remained stable. His blood pressure is much better controlled during this visit compared to his last visit 3 months ago. He has been staying with her salt  February 13, 2014: Doing the same  Dec. 2, 2015: No new cardiac issues.  Has had some leg swelling.  Also has some red ulceration in the left lower leg.   They look like bug bites to me.  He has hypothyroidism  and is followed by Dr. Sharlett Iles.  Avoids salt.  Sleeps in a recliner - has lots of leg edema.    Current Outpatient Prescriptions on File Prior to Visit  Medication Sig Dispense Refill  . amiodarone (PACERONE) 200 MG tablet Take 1 tablet (200 mg total) by mouth daily. 60 tablet 5  . amLODipine (NORVASC) 10 MG tablet TAKE ONE TABLET BY MOUTH ONCE DAILY 30 tablet 2  . atorvastatin (LIPITOR) 80 MG tablet Take 80 mg  by mouth daily at 6 PM.     . cholecalciferol (VITAMIN D) 1000 UNITS tablet Take 1,000 Units by mouth daily at 6 PM.     . cloNIDine (CATAPRES) 0.2 MG tablet TAKE ONE TABLET BY MOUTH TWICE DAILY 60 tablet 1  . fenofibrate 160 MG tablet TAKE ONE TABLET BY MOUTH ONCE DAILY 30 tablet 0  . furosemide (LASIX) 80 MG tablet Take 0.5 tablets (40 mg total) by mouth daily as needed for fluid. 45 tablet 0  . insulin aspart (NOVOLOG FLEXPEN) 100 UNIT/ML SOPN FlexPen Inject 10 Units into the skin 2 (two) times daily.     . Insulin Glargine (LANTUS SOLOSTAR) 100 UNIT/ML SOPN Inject 36 Units into the skin every morning.     . isosorbide mononitrate (IMDUR) 60 MG 24 hr tablet TAKE ONE TABLET BY MOUTH ONCE  DAILY 90 tablet 3  . Multiple Vitamin (MULTIVITAMIN WITH MINERALS) TABS Take 1 tablet by mouth every evening.     . nitroGLYCERIN (NITROSTAT) 0.4 MG SL tablet Place 1 tablet (0.4 mg total) under the tongue every 5 (five) minutes as needed for chest pain. 25 tablet 3  . polyethylene glycol (MIRALAX / GLYCOLAX) packet Take 17 g by mouth daily as needed for moderate constipation.    . ranitidine (ZANTAC) 300 MG tablet Take 300 mg by mouth 2 (two) times daily.  30 tablet 11  . silver sulfADIAZINE (SILVADENE) 1 % cream Apply 1 application topically daily. 50 g 0  . trolamine salicylate (ASPERCREME) 10 % cream Apply 1 application topically 2 (two) times daily as needed for muscle pain.     Marland Kitchen warfarin (COUMADIN) 5 MG tablet Take 2.5-5 mg by mouth daily. Takes 5mg  on Wed and Sat  Takes 2.5mg  all other days     No current facility-administered medications on file prior to visit.    Allergies  Allergen Reactions  . Betadine [Povidone Iodine] Rash  . Iodinated Diagnostic Agents Rash and Other (See Comments)    Does fine with premedications for cath  . Latex Hives    Past Medical History  Diagnosis Date  . Hypertension   . Hyperlipidemia   . Hypothyroidism   . CAD (coronary artery disease)     a. prior CABG in 1997 with redo in 2000. b. last cath in 2008 - managed medically  . Type 2 diabetes mellitus   . Generalized weakness     without overt findings  . Peripheral vascular disease   . Carotid disease, bilateral     with multiple bilateral carotid surgeries  . Psoriasis   . Meniere's disease   . Degenerative joint disease   . Gout   . Orthopnea     Two-pillow  . CKD (chronic kidney disease), stage IV     a. Has fistula in place.   . Chronic diastolic CHF (congestive heart failure)   . GERD (gastroesophageal reflux disease)   . Atrial fibrillation Aug. 2015    a. Dx 01/2014, s/p DCCV 03/06/14.  . Sinus bradycardia     a. Toprol D/C'd due to this.     Past Surgical History   Procedure Laterality Date  . Coronary artery bypass graft  1997 and 2000  . Carotid endarterectomy    . Lung removal, partial    . Laminectomies  July 2001    lumbar laminectomies  . Thoracotomy       left--due to fungal infection  . Shoulder surgery    . Cardiac catheterization    .  Appendectomy    . Av fistula placement Left 01/21/2013    Procedure: ARTERIOVENOUS (AV) FISTULA CREATION, Left Brachiocephalic;  Surgeon: Angelia Mould, MD;  Location: [facility];  Service: Vascular;  Laterality: Left;  . Cardiac catheterization  2008    L main irreg, LAD 80%, IMA-LAD & SVG-Diag patent, CFX 100%, SVG-OM 90%, RCA 70%, SVG-RCA OK, EF nl, med rx, no vessels appropriate for PCI  . Tee without cardioversion N/A 03/06/2014    Procedure: TRANSESOPHAGEAL ECHOCARDIOGRAM (TEE);  Surgeon: Larey Dresser, MD;  Location: [facility];  Service: Cardiovascular;  Laterality: N/A;  . Cardioversion N/A 03/06/2014    Procedure: CARDIOVERSION;  Surgeon: Larey Dresser, MD;  Location: [facility] ENDOSCOPY;  Service: Cardiovascular;  Laterality: N/A;    History  Smoking status  . Never Smoker   Smokeless tobacco  . Never Used    History  Alcohol Use No    Family History  Problem Relation Age of Onset  . Heart attack Father   . Heart disease Father     After 92 yrs of age  . Hyperlipidemia Father   . Hypertension Father   . Diabetes Mother   . Hypertension Mother   . Heart disease Brother   . Diabetes Brother     Reviw of Systems:  Reviewed in the HPI.  All other systems are negative.  Physical Exam: BP 148/70 mmHg  Pulse 58  Ht 5\' 10"  (1.778 m)  Wt 203 lb 6.4 oz (92.262 kg)  BMI 29.18 kg/m2 The patient is alert and oriented x 3.  The mood and affect are normal.   Skin: warm and dry.  Color is normal.   HEENT:   Normocephalic/atraumatic. Mucous membranes are normal. Lungs: Lungs are clear.  Heart: Regular rate S1-S2. Has a 2/6 systolic ejection murmur cholesterol border.   Abdomen: His  abdominal exam is benign. Extremities:  No clubbing cyanosis or edema Neuro:  Exam is nonfocal. Gait is normal.  ECG: 08/08/2013: Sinus bradycardia at 56. He has a right axis deviation. He has no ST or T wave changes. Assessment / Plan:

## 2014-05-27 NOTE — Assessment & Plan Note (Signed)
Continue same medications. He's not having any worsening of his chest pain or shortness bed. He does have moderate renal insufficiency.

## 2014-05-27 NOTE — Patient Instructions (Addendum)
Your physician recommends that you continue on your current medications as directed. Please refer to the Current Medication list given to you today.  Your physician wants you to follow-up in: 6 months with Dr. Acie Fredrickson.  You will receive a reminder letter in the mail two months in advance. If you don't receive a letter, please call our office to schedule the follow-up appointment.   For your  leg edema you  should do  the following 1. Leg elevation - I recommend the Lounge Dr. Leg rest.  See below for details  2. Salt restriction  -  3. Walk regularly 4. Compression hose - [facility] 5. Weight loss     Go to [email or web address]

## 2014-05-27 NOTE — Progress Notes (Signed)
History of Present Illness  [name] is a 80 y.o. (Dec 19, 1933) male who presents for a 6 month f/u ABI's.  The left great toe wound is healing well.  He is seen at the wound center once a week for checks.  His wife dresses it daily with collagen gel and dry guaze.  New complaints are right lower extremity edema.  He has been prescribed compression hose and elevation was discussed at his last PCP visit.      He underwent a Doppler study in April at another lab which showed an ABI of 67% on the right and 72% on the left. His past surgical history includes s/p left brachiocephalic AV fistula. He is also s/p Right carotid endarterectomy 01/29/2001; Left common carotid artery stenosis repair with interposition graft 12/30/1999; Left carotid patch angioplasty 12/01/1998; Left carotid endarterectomy 04/30/1989.    Pt meds include: Smoker: No Statin : Yes ASA: No Other anticoagulants/antiplatelets: coumadin for atrial fib  Past Medical History  Diagnosis Date  . Hypertension   . Hyperlipidemia   . Hypothyroidism   . CAD (coronary artery disease)     a. prior CABG in 1997 with redo in 2000. b. last cath in 2008 - managed medically  . Type 2 diabetes mellitus   . Generalized weakness     without overt findings  . Peripheral vascular disease   . Carotid disease, bilateral     with multiple bilateral carotid surgeries  . Psoriasis   . Meniere's disease   . Degenerative joint disease   . Gout   . Orthopnea     Two-pillow  . CKD (chronic kidney disease), stage IV     a. Has fistula in place.   . Chronic diastolic CHF (congestive heart failure)   . GERD (gastroesophageal reflux disease)   . Atrial fibrillation Aug. 2015    a. Dx 01/2014, s/p DCCV 03/06/14.  . Sinus bradycardia     a. Toprol D/C'd due to this.    Review of Systems  Constitutional: Negative for fever and chills.  HENT: Negative for congestion and hearing loss.   Eyes: Negative for double vision.  Respiratory:  Negative for cough and wheezing.   Cardiovascular: Positive for leg swelling. Negative for chest pain.  Gastrointestinal: Negative for nausea and vomiting.  Genitourinary: Negative for hematuria.  Musculoskeletal: Negative for neck pain.  Skin: Negative for itching.  Neurological: Negative for dizziness and tremors.  Endo/Heme/Allergies: Bruises/bleeds easily.  Psychiatric/Behavioral: The patient is not nervous/anxious.       General: A&O x 3, WD  Pulmonary: Sym exp, good air movt, CTAB, no rales, rhonchi, & wheezing   Cardiac:  Irregularly, irregular rhythm and rate, no murmurs   Vascular: Vessel Right Left  Radial Palpable Palpable  Brachial Palpable Palpable  Carotid Palpable, with bruit Palpable, with bruit  Aorta Not palpable N/A  Femoral Palpable Palpable  Popliteal Not palpable Not palpable  PT notPalpable notPalpable  DP Palpable Palpable   Gastrointestinal: soft, NTND  Musculoskeletal: M/S 5/5 throughout  Extremities without new ischemic changes.  Well healing left dorsum great toe ulcer.  Cap refill is brisk at tip of toes.  Neurologic: Pain and light touch intact in extremities Non-Invasive Vascular Imaging ABI (Date: 05/27/2014)  R: 0.87 , DP: biphasic, PT: biphasic, TBI: 0.55  L: 0.62 , DP: biphasic, PT: monophasic, TBI: 0.98    Medical Decision Making  [name] is a 80 y.o. male who presents with: PAD with healing  left great toe ulcer.  His ABI's are essentially unchanged from April.  He will continue to keep a close watch on his feet for ulcers or other ischemic changes.  Elevation of the feet above the level of his heart is recommended while at rest and compression stockings for right LE edema.  He will follow up in 6 months for repeat ABI's    Theda Sers, Mary[facility] PA-C [facility]: [phone]   05/27/2014, 1:52 PM

## 2014-05-28 ENCOUNTER — Encounter (HOSPITAL_BASED_OUTPATIENT_CLINIC_OR_DEPARTMENT_OTHER): Payer: Medicare Other | Attending: Internal Medicine

## 2014-05-28 DIAGNOSIS — E11621 Type 2 diabetes mellitus with foot ulcer: Secondary | ICD-10-CM | POA: Diagnosis not present

## 2014-05-28 DIAGNOSIS — L97529 Non-pressure chronic ulcer of other part of left foot with unspecified severity: Secondary | ICD-10-CM | POA: Insufficient documentation

## 2014-05-30 ENCOUNTER — Other Ambulatory Visit: Payer: Self-pay | Admitting: Cardiovascular Disease

## 2014-06-04 ENCOUNTER — Ambulatory Visit (INDEPENDENT_AMBULATORY_CARE_PROVIDER_SITE_OTHER): Payer: Medicare Other | Admitting: Pharmacist Clinician (PhC)/ Clinical Pharmacy Specialist

## 2014-06-04 DIAGNOSIS — I4891 Unspecified atrial fibrillation: Secondary | ICD-10-CM | POA: Diagnosis not present

## 2014-06-04 DIAGNOSIS — I481 Persistent atrial fibrillation: Secondary | ICD-10-CM | POA: Diagnosis not present

## 2014-06-04 DIAGNOSIS — L97529 Non-pressure chronic ulcer of other part of left foot with unspecified severity: Secondary | ICD-10-CM | POA: Diagnosis not present

## 2014-06-04 DIAGNOSIS — I4819 Other persistent atrial fibrillation: Secondary | ICD-10-CM

## 2014-06-04 DIAGNOSIS — Z5181 Encounter for therapeutic drug level monitoring: Secondary | ICD-10-CM

## 2014-06-04 DIAGNOSIS — E11621 Type 2 diabetes mellitus with foot ulcer: Secondary | ICD-10-CM | POA: Diagnosis not present

## 2014-06-04 LAB — POCT INR: INR: 1.9

## 2014-06-05 DIAGNOSIS — I480 Paroxysmal atrial fibrillation: Secondary | ICD-10-CM | POA: Diagnosis not present

## 2014-06-05 DIAGNOSIS — E1129 Type 2 diabetes mellitus with other diabetic kidney complication: Secondary | ICD-10-CM | POA: Diagnosis not present

## 2014-06-05 DIAGNOSIS — N184 Chronic kidney disease, stage 4 (severe): Secondary | ICD-10-CM | POA: Diagnosis not present

## 2014-06-05 DIAGNOSIS — Z6830 Body mass index (BMI) 30.0-30.9, adult: Secondary | ICD-10-CM | POA: Diagnosis not present

## 2014-06-05 DIAGNOSIS — I10 Essential (primary) hypertension: Secondary | ICD-10-CM | POA: Diagnosis not present

## 2014-06-10 DIAGNOSIS — R0602 Shortness of breath: Secondary | ICD-10-CM | POA: Diagnosis not present

## 2014-06-10 DIAGNOSIS — N184 Chronic kidney disease, stage 4 (severe): Secondary | ICD-10-CM | POA: Diagnosis not present

## 2014-06-10 DIAGNOSIS — I480 Paroxysmal atrial fibrillation: Secondary | ICD-10-CM | POA: Diagnosis not present

## 2014-06-10 DIAGNOSIS — E039 Hypothyroidism, unspecified: Secondary | ICD-10-CM | POA: Diagnosis not present

## 2014-06-10 DIAGNOSIS — I739 Peripheral vascular disease, unspecified: Secondary | ICD-10-CM | POA: Diagnosis not present

## 2014-06-10 DIAGNOSIS — E1151 Type 2 diabetes mellitus with diabetic peripheral angiopathy without gangrene: Secondary | ICD-10-CM | POA: Diagnosis not present

## 2014-06-10 DIAGNOSIS — Z6830 Body mass index (BMI) 30.0-30.9, adult: Secondary | ICD-10-CM | POA: Diagnosis not present

## 2014-06-11 DIAGNOSIS — E11621 Type 2 diabetes mellitus with foot ulcer: Secondary | ICD-10-CM | POA: Diagnosis not present

## 2014-06-11 DIAGNOSIS — L97529 Non-pressure chronic ulcer of other part of left foot with unspecified severity: Secondary | ICD-10-CM | POA: Diagnosis not present

## 2014-06-15 DIAGNOSIS — L570 Actinic keratosis: Secondary | ICD-10-CM | POA: Diagnosis not present

## 2014-06-15 DIAGNOSIS — L309 Dermatitis, unspecified: Secondary | ICD-10-CM | POA: Diagnosis not present

## 2014-06-25 ENCOUNTER — Ambulatory Visit (INDEPENDENT_AMBULATORY_CARE_PROVIDER_SITE_OTHER): Payer: Medicare Other | Admitting: Surgery

## 2014-06-25 DIAGNOSIS — I4819 Other persistent atrial fibrillation: Secondary | ICD-10-CM

## 2014-06-25 DIAGNOSIS — I481 Persistent atrial fibrillation: Secondary | ICD-10-CM | POA: Diagnosis not present

## 2014-06-25 DIAGNOSIS — Z5181 Encounter for therapeutic drug level monitoring: Secondary | ICD-10-CM | POA: Diagnosis not present

## 2014-06-25 DIAGNOSIS — I4891 Unspecified atrial fibrillation: Secondary | ICD-10-CM | POA: Diagnosis not present

## 2014-06-25 LAB — POCT INR: INR: 1.7

## 2014-07-03 DIAGNOSIS — I10 Essential (primary) hypertension: Secondary | ICD-10-CM | POA: Diagnosis not present

## 2014-07-03 DIAGNOSIS — E039 Hypothyroidism, unspecified: Secondary | ICD-10-CM | POA: Diagnosis not present

## 2014-07-06 ENCOUNTER — Ambulatory Visit (INDEPENDENT_AMBULATORY_CARE_PROVIDER_SITE_OTHER): Payer: Medicare Other | Admitting: Podiatry

## 2014-07-06 ENCOUNTER — Encounter: Payer: Self-pay | Admitting: Podiatry

## 2014-07-06 DIAGNOSIS — B351 Tinea unguium: Secondary | ICD-10-CM | POA: Diagnosis not present

## 2014-07-06 DIAGNOSIS — E1151 Type 2 diabetes mellitus with diabetic peripheral angiopathy without gangrene: Secondary | ICD-10-CM | POA: Diagnosis not present

## 2014-07-06 NOTE — Patient Instructions (Signed)
Apply topical antibiotic ointment to the fourth right toe daily until the skin looks normal Wear surgical shoe on the right foot  Diabetes and Foot Care Diabetes may cause you to have problems because of poor blood supply (circulation) to your feet and legs. This may cause the skin on your feet to become thinner, break easier, and heal more slowly. Your skin may become dry, and the skin may peel and crack. You may also have nerve damage in your legs and feet causing decreased feeling in them. You may not notice minor injuries to your feet that could lead to infections or more serious problems. Taking care of your feet is one of the most important things you can do for yourself.  HOME CARE INSTRUCTIONS  Wear shoes at all times, even in the house. Do not go barefoot. Bare feet are easily injured.  Check your feet daily for blisters, cuts, and redness. If you cannot see the bottom of your feet, use a mirror or ask someone for help.  Wash your feet with warm water (do not use hot water) and mild soap. Then pat your feet and the areas between your toes until they are completely dry. Do not soak your feet as this can dry your skin.  Apply a moisturizing lotion or petroleum jelly (that does not contain alcohol and is unscented) to the skin on your feet and to dry, brittle toenails. Do not apply lotion between your toes.  Trim your toenails straight across. Do not dig under them or around the cuticle. File the edges of your nails with an emery board or nail file.  Do not cut corns or calluses or try to remove them with medicine.  Wear clean socks or stockings every day. Make sure they are not too tight. Do not wear knee-high stockings since they may decrease blood flow to your legs.  Wear shoes that fit properly and have enough cushioning. To break in new shoes, wear them for just a few hours a day. This prevents you from injuring your feet. Always look in your shoes before you put them on to be sure  there are no objects inside.  Do not cross your legs. This may decrease the blood flow to your feet.  If you find a minor scrape, cut, or break in the skin on your feet, keep it and the skin around it clean and dry. These areas may be cleansed with mild soap and water. Do not cleanse the area with peroxide, alcohol, or iodine.  When you remove an adhesive bandage, be sure not to damage the skin around it.  If you have a wound, look at it several times a day to make sure it is healing.  Do not use heating pads or hot water bottles. They may burn your skin. If you have lost feeling in your feet or legs, you may not know it is happening until it is too late.  Make sure your health care provider performs a complete foot exam at least annually or more often if you have foot problems. Report any cuts, sores, or bruises to your health care provider immediately. SEEK MEDICAL CARE IF:   You have an injury that is not healing.  You have cuts or breaks in the skin.  You have an ingrown nail.  You notice redness on your legs or feet.  You feel burning or tingling in your legs or feet.  You have pain or cramps in your legs and feet.  Your legs or feet are numb.  Your feet always feel cold. SEEK IMMEDIATE MEDICAL CARE IF:   There is increasing redness, swelling, or pain in or around a wound.  There is a red line that goes up your leg.  Pus is coming from a wound.  You develop a fever or as directed by your health care provider.  You notice a bad smell coming from an ulcer or wound. Document Released: 06/09/2000 Document Revised: 02/12/2013 Document Reviewed: 11/19/2012 [facility] Patient Information 2015 [place], [place]. This information is not intended to replace advice given to you by your health care provider. Make sure you discuss any questions you have with your health care provider.

## 2014-07-07 ENCOUNTER — Encounter: Payer: Self-pay | Admitting: Podiatry

## 2014-07-07 NOTE — Progress Notes (Signed)
Patient ID: [name], male   DOB: [birth year], 81 y.o.   MRN: [id number]  Subjective: This patient presents today requesting debridement of painful toenails. The last visit in our office was on 03/30/2014. At that time he had a superficial skin ulceration left hallux with low-grade cellulitis. Initial treatment included Augmentin 875/125 twice a day 10 days and Silvadene cream. The surgical shoe was dispensed. He was to return in 10 days. He did have treatment apparently at the wound care center which resolved the skin ulcer on his left foot. Today he is requesting debridement of toenails which were last debrided on the visit of 03/30/2014  Objective: Orientated 3 The toenails are elongated, hypertrophic, discolored and tender to palpation Eschar dorsal fourth right toe   Assessment: History of peripheral arterial disease Mycotic toenails Eschar fourth right toe, clinically not infected  Plan: Debrided toenails 10 without a bleeding Patient was to was advised to wear existing surgical shoe on right foot and apply topical antibiotic ointment daily to the eschar in the fourth right toe and to the skin looks normal  Reappoint at three-month intervals or sooner if patient has concern

## 2014-07-09 DIAGNOSIS — E1129 Type 2 diabetes mellitus with other diabetic kidney complication: Secondary | ICD-10-CM | POA: Diagnosis not present

## 2014-07-09 DIAGNOSIS — N184 Chronic kidney disease, stage 4 (severe): Secondary | ICD-10-CM | POA: Diagnosis not present

## 2014-07-09 DIAGNOSIS — I739 Peripheral vascular disease, unspecified: Secondary | ICD-10-CM | POA: Diagnosis not present

## 2014-07-09 DIAGNOSIS — E1151 Type 2 diabetes mellitus with diabetic peripheral angiopathy without gangrene: Secondary | ICD-10-CM | POA: Diagnosis not present

## 2014-07-09 DIAGNOSIS — Z6830 Body mass index (BMI) 30.0-30.9, adult: Secondary | ICD-10-CM | POA: Diagnosis not present

## 2014-07-09 DIAGNOSIS — I10 Essential (primary) hypertension: Secondary | ICD-10-CM | POA: Diagnosis not present

## 2014-07-09 DIAGNOSIS — I129 Hypertensive chronic kidney disease with stage 1 through stage 4 chronic kidney disease, or unspecified chronic kidney disease: Secondary | ICD-10-CM | POA: Diagnosis not present

## 2014-07-09 DIAGNOSIS — I770 Arteriovenous fistula, acquired: Secondary | ICD-10-CM | POA: Diagnosis not present

## 2014-07-09 DIAGNOSIS — E039 Hypothyroidism, unspecified: Secondary | ICD-10-CM | POA: Diagnosis not present

## 2014-07-09 DIAGNOSIS — D649 Anemia, unspecified: Secondary | ICD-10-CM | POA: Diagnosis not present

## 2014-07-09 DIAGNOSIS — I2510 Atherosclerotic heart disease of native coronary artery without angina pectoris: Secondary | ICD-10-CM | POA: Diagnosis not present

## 2014-07-10 ENCOUNTER — Ambulatory Visit (INDEPENDENT_AMBULATORY_CARE_PROVIDER_SITE_OTHER): Payer: Medicare Other | Admitting: Pharmacist

## 2014-07-10 DIAGNOSIS — Z5181 Encounter for therapeutic drug level monitoring: Secondary | ICD-10-CM | POA: Diagnosis not present

## 2014-07-10 DIAGNOSIS — I4819 Other persistent atrial fibrillation: Secondary | ICD-10-CM

## 2014-07-10 DIAGNOSIS — I481 Persistent atrial fibrillation: Secondary | ICD-10-CM

## 2014-07-10 LAB — POCT INR: INR: 1.9

## 2014-07-13 DIAGNOSIS — L570 Actinic keratosis: Secondary | ICD-10-CM | POA: Diagnosis not present

## 2014-07-13 DIAGNOSIS — I8391 Asymptomatic varicose veins of right lower extremity: Secondary | ICD-10-CM | POA: Diagnosis not present

## 2014-07-17 ENCOUNTER — Other Ambulatory Visit: Payer: Self-pay | Admitting: Cardiovascular Disease

## 2014-07-20 DIAGNOSIS — N185 Chronic kidney disease, stage 5: Secondary | ICD-10-CM | POA: Diagnosis not present

## 2014-07-20 DIAGNOSIS — E039 Hypothyroidism, unspecified: Secondary | ICD-10-CM | POA: Diagnosis not present

## 2014-07-20 DIAGNOSIS — R21 Rash and other nonspecific skin eruption: Secondary | ICD-10-CM | POA: Diagnosis not present

## 2014-07-20 DIAGNOSIS — Z6830 Body mass index (BMI) 30.0-30.9, adult: Secondary | ICD-10-CM | POA: Diagnosis not present

## 2014-07-20 DIAGNOSIS — G309 Alzheimer's disease, unspecified: Secondary | ICD-10-CM | POA: Diagnosis not present

## 2014-07-23 ENCOUNTER — Ambulatory Visit (INDEPENDENT_AMBULATORY_CARE_PROVIDER_SITE_OTHER): Payer: Medicare Other | Admitting: *Deleted

## 2014-07-23 DIAGNOSIS — I481 Persistent atrial fibrillation: Secondary | ICD-10-CM | POA: Diagnosis not present

## 2014-07-23 DIAGNOSIS — I4819 Other persistent atrial fibrillation: Secondary | ICD-10-CM

## 2014-07-23 DIAGNOSIS — Z5181 Encounter for therapeutic drug level monitoring: Secondary | ICD-10-CM

## 2014-07-23 LAB — POCT INR: INR: 1.8

## 2014-08-07 ENCOUNTER — Ambulatory Visit (INDEPENDENT_AMBULATORY_CARE_PROVIDER_SITE_OTHER): Payer: Medicare Other | Admitting: *Deleted

## 2014-08-07 DIAGNOSIS — Z5181 Encounter for therapeutic drug level monitoring: Secondary | ICD-10-CM

## 2014-08-07 DIAGNOSIS — I481 Persistent atrial fibrillation: Secondary | ICD-10-CM

## 2014-08-07 DIAGNOSIS — I4819 Other persistent atrial fibrillation: Secondary | ICD-10-CM

## 2014-08-07 LAB — POCT INR: INR: 2.4

## 2014-08-13 DIAGNOSIS — Z6831 Body mass index (BMI) 31.0-31.9, adult: Secondary | ICD-10-CM | POA: Diagnosis not present

## 2014-08-13 DIAGNOSIS — N184 Chronic kidney disease, stage 4 (severe): Secondary | ICD-10-CM | POA: Diagnosis not present

## 2014-08-13 DIAGNOSIS — I10 Essential (primary) hypertension: Secondary | ICD-10-CM | POA: Diagnosis not present

## 2014-08-13 DIAGNOSIS — E1151 Type 2 diabetes mellitus with diabetic peripheral angiopathy without gangrene: Secondary | ICD-10-CM | POA: Diagnosis not present

## 2014-08-13 DIAGNOSIS — E039 Hypothyroidism, unspecified: Secondary | ICD-10-CM | POA: Diagnosis not present

## 2014-08-13 DIAGNOSIS — I2510 Atherosclerotic heart disease of native coronary artery without angina pectoris: Secondary | ICD-10-CM | POA: Diagnosis not present

## 2014-08-14 ENCOUNTER — Other Ambulatory Visit: Payer: Self-pay | Admitting: Cardiovascular Disease

## 2014-08-20 DIAGNOSIS — E1165 Type 2 diabetes mellitus with hyperglycemia: Secondary | ICD-10-CM | POA: Diagnosis not present

## 2014-08-20 DIAGNOSIS — I10 Essential (primary) hypertension: Secondary | ICD-10-CM | POA: Diagnosis not present

## 2014-08-20 DIAGNOSIS — N185 Chronic kidney disease, stage 5: Secondary | ICD-10-CM | POA: Diagnosis not present

## 2014-08-20 DIAGNOSIS — Z6829 Body mass index (BMI) 29.0-29.9, adult: Secondary | ICD-10-CM | POA: Diagnosis not present

## 2014-08-27 ENCOUNTER — Ambulatory Visit (INDEPENDENT_AMBULATORY_CARE_PROVIDER_SITE_OTHER): Payer: Medicare Other | Admitting: *Deleted

## 2014-08-27 DIAGNOSIS — E039 Hypothyroidism, unspecified: Secondary | ICD-10-CM | POA: Diagnosis not present

## 2014-08-27 DIAGNOSIS — Z5181 Encounter for therapeutic drug level monitoring: Secondary | ICD-10-CM | POA: Diagnosis not present

## 2014-08-27 DIAGNOSIS — I481 Persistent atrial fibrillation: Secondary | ICD-10-CM

## 2014-08-27 DIAGNOSIS — I4819 Other persistent atrial fibrillation: Secondary | ICD-10-CM

## 2014-08-27 DIAGNOSIS — I739 Peripheral vascular disease, unspecified: Secondary | ICD-10-CM | POA: Diagnosis not present

## 2014-08-27 DIAGNOSIS — I4891 Unspecified atrial fibrillation: Secondary | ICD-10-CM

## 2014-08-27 DIAGNOSIS — E1151 Type 2 diabetes mellitus with diabetic peripheral angiopathy without gangrene: Secondary | ICD-10-CM | POA: Diagnosis not present

## 2014-08-27 DIAGNOSIS — N184 Chronic kidney disease, stage 4 (severe): Secondary | ICD-10-CM | POA: Diagnosis not present

## 2014-08-27 DIAGNOSIS — I10 Essential (primary) hypertension: Secondary | ICD-10-CM | POA: Diagnosis not present

## 2014-08-27 DIAGNOSIS — Z6829 Body mass index (BMI) 29.0-29.9, adult: Secondary | ICD-10-CM | POA: Diagnosis not present

## 2014-08-27 LAB — POCT INR: INR: 1.7

## 2014-08-28 ENCOUNTER — Other Ambulatory Visit: Payer: Self-pay | Admitting: Cardiovascular Disease

## 2014-09-03 DIAGNOSIS — E11311 Type 2 diabetes mellitus with unspecified diabetic retinopathy with macular edema: Secondary | ICD-10-CM | POA: Diagnosis not present

## 2014-09-03 DIAGNOSIS — E11339 Type 2 diabetes mellitus with moderate nonproliferative diabetic retinopathy without macular edema: Secondary | ICD-10-CM | POA: Diagnosis not present

## 2014-09-10 ENCOUNTER — Ambulatory Visit (INDEPENDENT_AMBULATORY_CARE_PROVIDER_SITE_OTHER): Payer: Medicare Other | Admitting: Surgery

## 2014-09-10 DIAGNOSIS — I481 Persistent atrial fibrillation: Secondary | ICD-10-CM

## 2014-09-10 DIAGNOSIS — Z5181 Encounter for therapeutic drug level monitoring: Secondary | ICD-10-CM | POA: Diagnosis not present

## 2014-09-10 DIAGNOSIS — I4891 Unspecified atrial fibrillation: Secondary | ICD-10-CM

## 2014-09-10 DIAGNOSIS — I4819 Other persistent atrial fibrillation: Secondary | ICD-10-CM

## 2014-09-10 LAB — POCT INR: INR: 1.8

## 2014-09-16 ENCOUNTER — Ambulatory Visit: Payer: Medicare Other | Admitting: Family

## 2014-09-16 ENCOUNTER — Encounter (HOSPITAL_COMMUNITY): Payer: Medicare Other

## 2014-09-16 ENCOUNTER — Other Ambulatory Visit (HOSPITAL_COMMUNITY): Payer: Medicare Other

## 2014-09-24 ENCOUNTER — Ambulatory Visit (INDEPENDENT_AMBULATORY_CARE_PROVIDER_SITE_OTHER): Payer: Medicare Other | Admitting: *Deleted

## 2014-09-24 DIAGNOSIS — I4891 Unspecified atrial fibrillation: Secondary | ICD-10-CM | POA: Diagnosis not present

## 2014-09-24 DIAGNOSIS — I481 Persistent atrial fibrillation: Secondary | ICD-10-CM | POA: Diagnosis not present

## 2014-09-24 DIAGNOSIS — I4819 Other persistent atrial fibrillation: Secondary | ICD-10-CM

## 2014-09-24 DIAGNOSIS — Z5181 Encounter for therapeutic drug level monitoring: Secondary | ICD-10-CM

## 2014-09-24 LAB — POCT INR: INR: 1.9

## 2014-10-05 ENCOUNTER — Ambulatory Visit (INDEPENDENT_AMBULATORY_CARE_PROVIDER_SITE_OTHER): Payer: Medicare Other | Admitting: Podiatry

## 2014-10-05 ENCOUNTER — Encounter: Payer: Self-pay | Admitting: Podiatry

## 2014-10-05 DIAGNOSIS — B351 Tinea unguium: Secondary | ICD-10-CM | POA: Diagnosis not present

## 2014-10-05 DIAGNOSIS — E1151 Type 2 diabetes mellitus with diabetic peripheral angiopathy without gangrene: Secondary | ICD-10-CM | POA: Diagnosis not present

## 2014-10-05 NOTE — Patient Instructions (Signed)
Diabetes and Foot Care Diabetes may cause you to have problems because of poor blood supply (circulation) to your feet and legs. This may cause the skin on your feet to become thinner, break easier, and heal more slowly. Your skin may become dry, and the skin may peel and crack. You may also have nerve damage in your legs and feet causing decreased feeling in them. You may not notice minor injuries to your feet that could lead to infections or more serious problems. Taking care of your feet is one of the most important things you can do for yourself.  HOME CARE INSTRUCTIONS  Wear shoes at all times, even in the house. Do not go barefoot. Bare feet are easily injured.  Check your feet daily for blisters, cuts, and redness. If you cannot see the bottom of your feet, use a mirror or ask someone for help.  Wash your feet with warm water (do not use hot water) and mild soap. Then pat your feet and the areas between your toes until they are completely dry. Do not soak your feet as this can dry your skin.  Apply a moisturizing lotion or petroleum jelly (that does not contain alcohol and is unscented) to the skin on your feet and to dry, brittle toenails. Do not apply lotion between your toes.  Trim your toenails straight across. Do not dig under them or around the cuticle. File the edges of your nails with an emery board or nail file.  Do not cut corns or calluses or try to remove them with medicine.  Wear clean socks or stockings every day. Make sure they are not too tight. Do not wear knee-high stockings since they may decrease blood flow to your legs.  Wear shoes that fit properly and have enough cushioning. To break in new shoes, wear them for just a few hours a day. This prevents you from injuring your feet. Always look in your shoes before you put them on to be sure there are no objects inside.  Do not cross your legs. This may decrease the blood flow to your feet.  If you find a minor scrape,  cut, or break in the skin on your feet, keep it and the skin around it clean and dry. These areas may be cleansed with mild soap and water. Do not cleanse the area with peroxide, alcohol, or iodine.  When you remove an adhesive bandage, be sure not to damage the skin around it.  If you have a wound, look at it several times a day to make sure it is healing.  Do not use heating pads or hot water bottles. They may burn your skin. If you have lost feeling in your feet or legs, you may not know it is happening until it is too late.  Make sure your health care provider performs a complete foot exam at least annually or more often if you have foot problems. Report any cuts, sores, or bruises to your health care provider immediately. SEEK MEDICAL CARE IF:   You have an injury that is not healing.  You have cuts or breaks in the skin.  You have an ingrown nail.  You notice redness on your legs or feet.  You feel burning or tingling in your legs or feet.  You have pain or cramps in your legs and feet.  Your legs or feet are numb.  Your feet always feel cold. SEEK IMMEDIATE MEDICAL CARE IF:   There is increasing redness,   swelling, or pain in or around a wound.  There is a red line that goes up your leg.  Pus is coming from a wound.  You develop a fever or as directed by your health care provider.  You notice a bad smell coming from an ulcer or wound. Document Released: 06/09/2000 Document Revised: 02/12/2013 Document Reviewed: 11/19/2012 Exit[employer] Patient Information 2015 [employer]. This information is not intended to replace advice given to you by your health care provider. Make sure you discuss any questions you have with your health care provider.  

## 2014-10-05 NOTE — Progress Notes (Signed)
Patient ID: [name], male   DOB: Nov 24, 1933, 81 y.o.   MRN: [id number]  Subjective: This patient presents today requesting debridement of toenails Patient's wife is present in the treatment room today  Objective: The toenails are brittle, hypertrophic, discolored 6-1  Assessment: Onychomycoses 6-10 Diabetic with a history of peripheral arterial disease  Plan: Debridement toenails 10 without any bleeding  Patient request return intervals prior to 12 weeks, scheduled 10 weeks

## 2014-10-08 ENCOUNTER — Ambulatory Visit (INDEPENDENT_AMBULATORY_CARE_PROVIDER_SITE_OTHER): Payer: Medicare Other | Admitting: *Deleted

## 2014-10-08 DIAGNOSIS — I481 Persistent atrial fibrillation: Secondary | ICD-10-CM

## 2014-10-08 DIAGNOSIS — Z5181 Encounter for therapeutic drug level monitoring: Secondary | ICD-10-CM

## 2014-10-08 DIAGNOSIS — I4891 Unspecified atrial fibrillation: Secondary | ICD-10-CM

## 2014-10-08 DIAGNOSIS — I4819 Other persistent atrial fibrillation: Secondary | ICD-10-CM

## 2014-10-08 LAB — POCT INR: INR: 2.7

## 2014-10-12 DIAGNOSIS — I129 Hypertensive chronic kidney disease with stage 1 through stage 4 chronic kidney disease, or unspecified chronic kidney disease: Secondary | ICD-10-CM | POA: Diagnosis not present

## 2014-10-12 DIAGNOSIS — D649 Anemia, unspecified: Secondary | ICD-10-CM | POA: Diagnosis not present

## 2014-10-12 DIAGNOSIS — N184 Chronic kidney disease, stage 4 (severe): Secondary | ICD-10-CM | POA: Diagnosis not present

## 2014-10-12 DIAGNOSIS — E119 Type 2 diabetes mellitus without complications: Secondary | ICD-10-CM | POA: Diagnosis not present

## 2014-10-29 ENCOUNTER — Ambulatory Visit (INDEPENDENT_AMBULATORY_CARE_PROVIDER_SITE_OTHER): Payer: Medicare Other

## 2014-10-29 DIAGNOSIS — I481 Persistent atrial fibrillation: Secondary | ICD-10-CM | POA: Diagnosis not present

## 2014-10-29 DIAGNOSIS — I4891 Unspecified atrial fibrillation: Secondary | ICD-10-CM

## 2014-10-29 DIAGNOSIS — Z5181 Encounter for therapeutic drug level monitoring: Secondary | ICD-10-CM

## 2014-10-29 DIAGNOSIS — I4819 Other persistent atrial fibrillation: Secondary | ICD-10-CM

## 2014-10-29 LAB — POCT INR: INR: 2.8

## 2014-11-18 ENCOUNTER — Other Ambulatory Visit: Payer: Self-pay | Admitting: Vascular Surgery

## 2014-11-18 DIAGNOSIS — I739 Peripheral vascular disease, unspecified: Secondary | ICD-10-CM

## 2014-11-20 ENCOUNTER — Encounter: Payer: Self-pay | Admitting: Vascular Surgery

## 2014-11-25 ENCOUNTER — Encounter: Payer: Self-pay | Admitting: Vascular Surgery

## 2014-11-25 ENCOUNTER — Encounter (HOSPITAL_COMMUNITY): Payer: Medicare Other

## 2014-11-25 ENCOUNTER — Ambulatory Visit (INDEPENDENT_AMBULATORY_CARE_PROVIDER_SITE_OTHER)
Admission: RE | Admit: 2014-11-25 | Discharge: 2014-11-25 | Disposition: A | Discharge status: Home / Self Care | Payer: Medicare Other | Source: Ambulatory Visit | Attending: Vascular Surgery | Admitting: Vascular Surgery

## 2014-11-25 ENCOUNTER — Ambulatory Visit (HOSPITAL_COMMUNITY)
Admission: RE | Admit: 2014-11-25 | Discharge: 2014-11-25 | Disposition: A | Discharge status: Home / Self Care | Payer: Medicare Other | Source: Ambulatory Visit | Attending: Vascular Surgery | Admitting: Vascular Surgery

## 2014-11-25 ENCOUNTER — Ambulatory Visit: Payer: Medicare Other | Admitting: Vascular Surgery

## 2014-11-25 ENCOUNTER — Ambulatory Visit (INDEPENDENT_AMBULATORY_CARE_PROVIDER_SITE_OTHER): Payer: Medicare Other | Admitting: Vascular Surgery

## 2014-11-25 VITALS — BP 147/45 | HR 42 | Ht 70.0 in | Wt 190.3 lb

## 2014-11-25 DIAGNOSIS — Z48812 Encounter for surgical aftercare following surgery on the circulatory system: Secondary | ICD-10-CM

## 2014-11-25 DIAGNOSIS — I6523 Occlusion and stenosis of bilateral carotid arteries: Secondary | ICD-10-CM | POA: Diagnosis not present

## 2014-11-25 DIAGNOSIS — I739 Peripheral vascular disease, unspecified: Secondary | ICD-10-CM

## 2014-11-25 NOTE — Progress Notes (Signed)
[facility]  Patient name: [name] MRN: [id number] DOB: [birth year] Sex: male  REASON FOR VISIT: Follow up  HPI: [name] is a 81 y.o. male who comes in for a 6 month follow up visit. He has known bilateral carotid disease and is undergone bilateral carotid interventions including bilateral carotid endarterectomies and a left interposition vein graft placement.  Today, he has no specific complaints. He denies any history of stroke, TIAs, expressive or receptive aphasia, or amaurosis fugax. He denies any history of claudication. However, his activity is very limited. He denies any history of rest pain or nonhealing ulcers.  He also has stage IV chronic kidney disease. He is not on dialysis. He's had a previous fistula placed in his left upper arm.  Past Medical History  Diagnosis Date  . Hypertension   . Hyperlipidemia   . Hypothyroidism   . CAD (coronary artery disease)     a. prior CABG in 1997 with redo in 2000. b. last cath in 2008 - managed medically  . Type 2 diabetes mellitus   . Generalized weakness     without overt findings  . Peripheral vascular disease   . Carotid disease, bilateral     with multiple bilateral carotid surgeries  . Psoriasis   . Meniere's disease   . Degenerative joint disease   . Gout   . Orthopnea     Two-pillow  . CKD (chronic kidney disease), stage IV     a. Has fistula in place.   . Chronic diastolic CHF (congestive heart failure)   . GERD (gastroesophageal reflux disease)   . Atrial fibrillation Aug. 2015    a. Dx 01/2014, s/p DCCV 03/06/14.  . Sinus bradycardia     a. Toprol D/C'd due to this.    Family History  Problem Relation Age of Onset  . Heart attack Father   . Heart disease Father     After 58 yrs of age  . Hyperlipidemia Father   . Hypertension Father   . Diabetes Mother   . Hypertension Mother   . Heart disease Mother   . Heart disease Brother   . Diabetes Brother    SOCIAL  HISTORY: History  Substance Use Topics  . Smoking status: Never Smoker   . Smokeless tobacco: Never Used  . Alcohol Use: No   Allergies  Allergen Reactions  . Betadine [Povidone Iodine] Rash  . Iodinated Diagnostic Agents Rash and Other (See Comments)    Does fine with premedications for cath  . Latex Hives   Current Outpatient Prescriptions  Medication Sig Dispense Refill  . amiodarone (PACERONE) 200 MG tablet Take 1 tablet (200 mg total) by mouth daily. 60 tablet 5  . amLODipine (NORVASC) 10 MG tablet TAKE ONE TABLET BY MOUTH ONCE DAILY 30 tablet 5  . atorvastatin (LIPITOR) 80 MG tablet TAKE ONE TABLET BY MOUTH ONCE DAILY 90 tablet 1  . cholecalciferol (VITAMIN D) 1000 UNITS tablet Take 1,000 Units by mouth daily at 6 PM.     . cloNIDine (CATAPRES) 0.2 MG tablet TAKE ONE TABLET BY MOUTH TWICE DAILY 60 tablet 5  . furosemide (LASIX) 80 MG tablet Take 0.5 tablets (40 mg total) by mouth daily as needed for fluid. (Patient taking differently: Take 80 mg by mouth daily. ) 45 tablet 0  . insulin aspart (NOVOLOG FLEXPEN) 100 UNIT/ML SOPN FlexPen Inject 10 Units into the skin 2 (two) times daily.     . Insulin  Glargine (LANTUS SOLOSTAR) 100 UNIT/ML SOPN Inject 32 Units into the skin every morning.     . isosorbide mononitrate (IMDUR) 60 MG 24 hr tablet TAKE ONE TABLET BY MOUTH ONCE DAILY 90 tablet 3  . levothyroxine (SYNTHROID, LEVOTHROID) 175 MCG tablet Take 175 mcg by mouth daily before breakfast.    . Multiple Vitamin (MULTIVITAMIN WITH MINERALS) TABS Take 1 tablet by mouth every evening.     . nitroGLYCERIN (NITROSTAT) 0.4 MG SL tablet Place 1 tablet (0.4 mg total) under the tongue every 5 (five) minutes as needed for chest pain. 25 tablet 3  . polyethylene glycol (MIRALAX / GLYCOLAX) packet Take 17 g by mouth daily as needed for moderate constipation.    . ranitidine (ZANTAC) 300 MG tablet Take 300 mg by mouth 2 (two) times daily.  30 tablet 11  . silver sulfADIAZINE (SILVADENE) 1 %  cream Apply 1 application topically daily. 50 g 0  . trolamine salicylate (ASPERCREME) 10 % cream Apply 1 application topically 2 (two) times daily as needed for muscle pain.     Marland Kitchen warfarin (COUMADIN) 5 MG tablet Take 5 mg by mouth daily.      No current facility-administered medications for this visit.   REVIEW OF SYSTEMS: Valu.Nieves ] denotes positive finding; [  ] denotes negative finding  CARDIOVASCULAR:  [ ]  chest pain   [ ]  chest pressure   [ ]  palpitations   [ ]  orthopnea   Valu.Nieves ] dyspnea on exertion   [ ]  claudication   [ ]  rest pain   [ ]  DVT   [ ]  phlebitis PULMONARY:   [ ]  productive cough   [ ]  asthma   [ ]  wheezing NEUROLOGIC:   [ ]  weakness  [ ]  paresthesias  [ ]  aphasia  [ ]  amaurosis  [ ]  dizziness HEMATOLOGIC:   [ ]  bleeding problems   [ ]  clotting disorders MUSCULOSKELETAL:  [ ]  joint pain   [ ]  joint swelling [ ]  leg swelling GASTROINTESTINAL: [ ]   blood in stool  [ ]   hematemesis GENITOURINARY:  [ ]   dysuria  [ ]   hematuria PSYCHIATRIC:  [ ]  history of major depression INTEGUMENTARY:  [ ]  rashes  [ ]  ulcers CONSTITUTIONAL:  [ ]  fever   [ ]  chills  PHYSICAL EXAM: Filed Vitals:   11/25/14 1148  BP: 147/45  Pulse: 42  Height: 5\' 10"  (1.778 m)  Weight: 190 lb 4.8 oz (86.32 kg)  SpO2: 97%   GENERAL: The patient is a well-nourished male, in no acute distress. The vital signs are documented above. CARDIOVASCULAR: There is a regular rate and rhythm. He has bilateral carotid bruits. He has an excellent thrill in his upper arm fistula in the left arm. He has palpable femoral pulses. I cannot palpate pedal pulses. He has mild bilateral lower extremity swelling. PULMONARY: There is good air exchange bilaterally without wheezing or rales. ABDOMEN: Soft and non-tender with normal pitched bowel sounds.  MUSCULOSKELETAL: There are no major deformities or cyanosis. NEUROLOGIC: No focal weakness or paresthesias are detected. SKIN: There are no ulcers or rashes noted. PSYCHIATRIC: The  patient has a normal affect.  DATA:  I have independently interpreted his carotid duplex scan which shows a 60-79% right carotid stenosis with no recurrent stenosis on the left.  I have independently interpreted his arterial Doppler study which shows an ABI of 87% on the right with a toe pressure of 87 mmHg. ABI on the left is 96. Toe pressure  on the left is 75 mmHg. He has monophasic Doppler signals in the dorsalis pedis and posterior tibial arteries bilaterally.  MEDICAL ISSUES: BILATERAL CAROTID DISEASE: There is no evidence of recurrent carotid stenosis on the left where he has an interposition vein graft. On the right side he has a 60-79% stenosis but is asymptomatic. I have ordered a follow up carotid duplex in 6 months and I'll see him back at that time. If this stenosis progressed to greater than 80% he could be considered potentially for carotid stenting. He would be at high-risk for surgery because of his age and medical comorbidities.   PERIPHERAL VASCULAR DISEASE: He does have evidence of mild infra-inguinal arterial occlusive disease but is a systematic. I have ordered a follow up ABIs in 1 year.    Deitra Mayo [facility] [place]: [phone]

## 2014-11-25 NOTE — Addendum Note (Signed)
Addended by: Mena Goes on: 11/25/2014 04:35 PM   Modules accepted: Orders

## 2014-11-26 ENCOUNTER — Ambulatory Visit (INDEPENDENT_AMBULATORY_CARE_PROVIDER_SITE_OTHER): Payer: Medicare Other | Admitting: *Deleted

## 2014-11-26 DIAGNOSIS — I4891 Unspecified atrial fibrillation: Secondary | ICD-10-CM

## 2014-11-26 DIAGNOSIS — Z6829 Body mass index (BMI) 29.0-29.9, adult: Secondary | ICD-10-CM | POA: Diagnosis not present

## 2014-11-26 DIAGNOSIS — Z5181 Encounter for therapeutic drug level monitoring: Secondary | ICD-10-CM | POA: Diagnosis not present

## 2014-11-26 DIAGNOSIS — E1151 Type 2 diabetes mellitus with diabetic peripheral angiopathy without gangrene: Secondary | ICD-10-CM | POA: Diagnosis not present

## 2014-11-26 DIAGNOSIS — I4819 Other persistent atrial fibrillation: Secondary | ICD-10-CM

## 2014-11-26 DIAGNOSIS — I10 Essential (primary) hypertension: Secondary | ICD-10-CM | POA: Diagnosis not present

## 2014-11-26 DIAGNOSIS — I481 Persistent atrial fibrillation: Secondary | ICD-10-CM | POA: Diagnosis not present

## 2014-11-26 DIAGNOSIS — N185 Chronic kidney disease, stage 5: Secondary | ICD-10-CM | POA: Diagnosis not present

## 2014-11-26 LAB — POCT INR: INR: 3.6

## 2014-12-02 ENCOUNTER — Encounter: Payer: Self-pay | Admitting: Cardiovascular Disease

## 2014-12-02 ENCOUNTER — Ambulatory Visit (INDEPENDENT_AMBULATORY_CARE_PROVIDER_SITE_OTHER): Payer: Medicare Other | Admitting: Cardiovascular Disease

## 2014-12-02 VITALS — BP 174/66 | HR 46 | Ht 70.0 in | Wt 188.8 lb

## 2014-12-02 DIAGNOSIS — I25810 Atherosclerosis of coronary artery bypass graft(s) without angina pectoris: Secondary | ICD-10-CM | POA: Diagnosis not present

## 2014-12-02 DIAGNOSIS — I5042 Chronic combined systolic (congestive) and diastolic (congestive) heart failure: Secondary | ICD-10-CM

## 2014-12-02 DIAGNOSIS — I10 Essential (primary) hypertension: Secondary | ICD-10-CM

## 2014-12-02 DIAGNOSIS — I6523 Occlusion and stenosis of bilateral carotid arteries: Secondary | ICD-10-CM | POA: Diagnosis not present

## 2014-12-02 MED ORDER — DOXAZOSIN MESYLATE 4 MG PO TABS: 8.0000 mg | ORAL_TABLET | Freq: Every day | ORAL | Status: DC

## 2014-12-02 NOTE — Patient Instructions (Signed)
Medication Instructions:  TAKE Clonidine 1/2 tablet for 1 week then STOP START Cardura 8 mg once daily  Labwork: Your physician recommends that you return for lab work in: 1 month on the same day as your nurse visit for BP check   Testing/Procedures: None Ordered  Follow-Up: Your physician recommends that you schedule a follow-up appointment in: 1 month with nurse for BP check   Your physician wants you to follow-up in: 6 months with Dr. Acie Fredrickson.  You will receive a reminder letter in the mail two months in advance. If you don't receive a letter, please call our office to schedule the follow-up appointment.

## 2014-12-02 NOTE — Progress Notes (Signed)
[name] Date of Birth  12/03/1933 [facility] [address]    [place], [place]  [address] [phone]  Fax  [phone]  Problem list:  1. Coronary artery disease 2. Cut chronic kidney disease 3. Atrophic leg 4. Hypothyroidism  History of Present Illness:  [name] is a 81 year old gentleman with a history of coronary artery disease and coronary artery bypass grafting. He recently presented with some right-sided shoulder pain that radiates through to his chest. He had a stress Myoview study which revealed a small inferolateral defect. He was set up for cardiac catheterization but his precath labs revealed a creatinine of 2.4 which was new. His baseline creatinine is between 1.6 and 1.8. We discontinued his HCTZ and his ACE inhibitor.  He was seen by his medical doctor who also stop his metformin.  We were going to do a cardiac catheterization this winter but we canceled the case because of his renal insufficiency. His metformin was stopped and he made some other medical changes in his creatinine has now improved. Fortunately, he's not having any further episodes of angina.  He informed me that his insurance company is no longer paying for Ranexa.  He's been bradycardic for years. We do not have him on beta blocker for that reason.  He's had some shoulder problems. He needs to have surgery on his right shoulder soon. He will eventually need to have surgery on his left shoulder. He had a low risk  Myoview study in November, 2012. He had an echocardiogram in August, 2013 that was also unchanged. He does not follow a strict diabetic diet.  His glucose levels went very high after his steroid injection ( > 500)  September 27, 2012:  Pt is doing well.  He denies any chest pain. Has some shoulder soreness.  He does have chronic dyspnea and has had worsening dyspnea since he had the flu in January.  He stays away from salt.  Nov. 13, 2014;  He contniues to have  dyspnea an CP.  He did not take a NTG.  He typically gets CP with exertion.   He is short of breath at rest and with exertion.  He seems to minimize his symptoms - wife thinks he has more shortness of breath that he admits to.    i was asked to see him again by Dr. Lorrene Reid for worsening dyspnea.  He avoids  salt.   Echo showed LVEF of 65-70% with LVH, .mild aortic stenosis  Feb. 13, 2015:  Pt is feeling well.  No angina.  Kidney function remained stable. His blood pressure is much better controlled during this visit compared to his last visit 3 months ago. He has been staying with her salt  February 13, 2014: Doing the same  Dec. 2, 2015: No new cardiac issues.  Has had some leg swelling.  Also has some red ulceration in the left lower leg.   They look like bug bites to me.  He has hypothyroidism  and is followed by Dr. Sharlett Iles.  Avoids salt.  Sleeps in a recliner - has lots of leg edema.   December 02, 2014  Pt is doing well.  No CP BP is typically elevated.    Current Outpatient Prescriptions on File Prior to Visit  Medication Sig Dispense Refill  . amiodarone (PACERONE) 200 MG tablet Take 1 tablet (200 mg total) by mouth daily. 60 tablet 5  . amLODipine (NORVASC) 10 MG tablet TAKE  ONE TABLET BY MOUTH ONCE DAILY 30 tablet 5  . atorvastatin (LIPITOR) 80 MG tablet TAKE ONE TABLET BY MOUTH ONCE DAILY 90 tablet 1  . cholecalciferol (VITAMIN D) 1000 UNITS tablet Take 1,000 Units by mouth daily at 6 PM.     . furosemide (LASIX) 80 MG tablet Take 0.5 tablets (40 mg total) by mouth daily as needed for fluid. (Patient taking differently: Take 80 mg by mouth daily. ) 45 tablet 0  . insulin aspart (NOVOLOG FLEXPEN) 100 UNIT/ML SOPN FlexPen Inject 10 Units into the skin 2 (two) times daily.     . Insulin Glargine (LANTUS SOLOSTAR) 100 UNIT/ML SOPN Inject 32 Units into the skin every morning.     . isosorbide mononitrate (IMDUR) 60 MG 24 hr tablet TAKE ONE TABLET BY MOUTH ONCE DAILY 90 tablet 3  .  levothyroxine (SYNTHROID, LEVOTHROID) 175 MCG tablet Take 175 mcg by mouth daily before breakfast.    . Multiple Vitamin (MULTIVITAMIN WITH MINERALS) TABS Take 1 tablet by mouth every evening.     . nitroGLYCERIN (NITROSTAT) 0.4 MG SL tablet Place 1 tablet (0.4 mg total) under the tongue every 5 (five) minutes as needed for chest pain. 25 tablet 3  . polyethylene glycol (MIRALAX / GLYCOLAX) packet Take 17 g by mouth daily as needed for moderate constipation.    . ranitidine (ZANTAC) 300 MG tablet Take 300 mg by mouth 2 (two) times daily.  30 tablet 11  . silver sulfADIAZINE (SILVADENE) 1 % cream Apply 1 application topically daily. 50 g 0  . trolamine salicylate (ASPERCREME) 10 % cream Apply 1 application topically 2 (two) times daily as needed for muscle pain.     Marland Kitchen warfarin (COUMADIN) 5 MG tablet Take 5 mg by mouth daily.      No current facility-administered medications on file prior to visit.    Allergies  Allergen Reactions  . Betadine [Povidone Iodine] Rash  . Iodinated Diagnostic Agents Rash and Other (See Comments)    Does fine with premedications for cath  . Latex Hives    Past Medical History  Diagnosis Date  . Hypertension   . Hyperlipidemia   . Hypothyroidism   . CAD (coronary artery disease)     a. prior CABG in 1997 with redo in 2000. b. last cath in 2008 - managed medically  . Type 2 diabetes mellitus   . Generalized weakness     without overt findings  . Peripheral vascular disease   . Carotid disease, bilateral     with multiple bilateral carotid surgeries  . Psoriasis   . Meniere's disease   . Degenerative joint disease   . Gout   . Orthopnea     Two-pillow  . CKD (chronic kidney disease), stage IV     a. Has fistula in place.   . Chronic diastolic CHF (congestive heart failure)   . GERD (gastroesophageal reflux disease)   . Atrial fibrillation Aug. 2015    a. Dx 01/2014, s/p DCCV 03/06/14.  . Sinus bradycardia     a. Toprol D/C'd due to this.     Past  Surgical History  Procedure Laterality Date  . Coronary artery bypass graft  1997 and 2000  . Carotid endarterectomy    . Lung removal, partial    . Laminectomies  July 2001    lumbar laminectomies  . Thoracotomy       left--due to fungal infection  . Shoulder surgery    . Cardiac catheterization    .  Appendectomy    . Av fistula placement Left 01/21/2013    Procedure: ARTERIOVENOUS (AV) FISTULA CREATION, Left Brachiocephalic;  Surgeon: Angelia Mould, MD;  Location: [facility];  Service: Vascular;  Laterality: Left;  . Cardiac catheterization  2008    L main irreg, LAD 80%, IMA-LAD & SVG-Diag patent, CFX 100%, SVG-OM 90%, RCA 70%, SVG-RCA OK, EF nl, med rx, no vessels appropriate for PCI  . Tee without cardioversion N/A 03/06/2014    Procedure: TRANSESOPHAGEAL ECHOCARDIOGRAM (TEE);  Surgeon: Larey Dresser, MD;  Location: [facility];  Service: Cardiovascular;  Laterality: N/A;  . Cardioversion N/A 03/06/2014    Procedure: CARDIOVERSION;  Surgeon: Larey Dresser, MD;  Location: [facility];  Service: Cardiovascular;  Laterality: N/A;    History  Smoking status  . Never Smoker   Smokeless tobacco  . Never Used    History  Alcohol Use No    Family History  Problem Relation Age of Onset  . Heart attack Father   . Heart disease Father     After 66 yrs of age  . Hyperlipidemia Father   . Hypertension Father   . Diabetes Mother   . Hypertension Mother   . Heart disease Mother   . Heart disease Brother   . Diabetes Brother     Reviw of Systems:  Reviewed in the HPI.  All other systems are negative.  Physical Exam: BP 174/66 mmHg  Pulse 46  Ht 5\' 10"  (1.778 m)  Wt 85.639 kg (188 lb 12.8 oz)  BMI 27.09 kg/m2  SpO2 98% The patient is alert and oriented x 3.  The mood and affect are normal.   Skin: warm and dry.  Color is normal.   HEENT:   Normocephalic/atraumatic. Mucous membranes are normal. Lungs: Lungs are clear.  Heart: Regular rate S1-S2. Has a 2/6  systolic ejection murmur cholesterol border.   Abdomen: His abdominal exam is benign. Extremities:  No clubbing cyanosis or edema Neuro:  Exam is nonfocal. Gait is normal.  ECG:   Assessment / Plan:   1. CAD  - he's not having episodes of angina. Continue current medications.  2. Chronic combined systolic and diastolic CHF - he's not having any severe shortness breath. Continue current medications. He has stage III for chronic kidney disease.  3. Atrial Fib- remains in NSR  4. CKD:  5. Essential HTN:   - BP is high. Will try cardura instead of clonidine.   His HR is slow - would not likely tolerate a beta blocker. May try adding methyl dopa in the future.      Alys Dulak, Wonda Cheng, MD  12/02/2014 5:48 PM    [address],  [place], [place]  [address] Pager [phone] Phone: [phone]; Fax: [phone]   [facility]  [address] [place], [place]  [address] [phone]    Fax [phone]

## 2014-12-09 DIAGNOSIS — I480 Paroxysmal atrial fibrillation: Secondary | ICD-10-CM | POA: Diagnosis not present

## 2014-12-09 DIAGNOSIS — I10 Essential (primary) hypertension: Secondary | ICD-10-CM | POA: Diagnosis not present

## 2014-12-09 DIAGNOSIS — G309 Alzheimer's disease, unspecified: Secondary | ICD-10-CM | POA: Diagnosis not present

## 2014-12-09 DIAGNOSIS — R269 Unspecified abnormalities of gait and mobility: Secondary | ICD-10-CM | POA: Diagnosis not present

## 2014-12-09 DIAGNOSIS — I739 Peripheral vascular disease, unspecified: Secondary | ICD-10-CM | POA: Diagnosis not present

## 2014-12-09 DIAGNOSIS — Z6827 Body mass index (BMI) 27.0-27.9, adult: Secondary | ICD-10-CM | POA: Diagnosis not present

## 2014-12-09 DIAGNOSIS — N185 Chronic kidney disease, stage 5: Secondary | ICD-10-CM | POA: Diagnosis not present

## 2014-12-09 DIAGNOSIS — E1151 Type 2 diabetes mellitus with diabetic peripheral angiopathy without gangrene: Secondary | ICD-10-CM | POA: Diagnosis not present

## 2014-12-14 ENCOUNTER — Ambulatory Visit: Payer: Medicare Other | Admitting: Podiatry

## 2014-12-17 ENCOUNTER — Ambulatory Visit (INDEPENDENT_AMBULATORY_CARE_PROVIDER_SITE_OTHER): Payer: Medicare Other

## 2014-12-17 DIAGNOSIS — I4891 Unspecified atrial fibrillation: Secondary | ICD-10-CM | POA: Diagnosis not present

## 2014-12-17 DIAGNOSIS — I481 Persistent atrial fibrillation: Secondary | ICD-10-CM | POA: Diagnosis not present

## 2014-12-17 DIAGNOSIS — Z5181 Encounter for therapeutic drug level monitoring: Secondary | ICD-10-CM | POA: Diagnosis not present

## 2014-12-17 DIAGNOSIS — I4819 Other persistent atrial fibrillation: Secondary | ICD-10-CM

## 2014-12-17 LAB — POCT INR: INR: 2.9

## 2014-12-22 ENCOUNTER — Encounter: Payer: Self-pay | Admitting: Podiatry

## 2014-12-22 ENCOUNTER — Ambulatory Visit (INDEPENDENT_AMBULATORY_CARE_PROVIDER_SITE_OTHER): Payer: Medicare Other | Admitting: Podiatry

## 2014-12-22 DIAGNOSIS — E1151 Type 2 diabetes mellitus with diabetic peripheral angiopathy without gangrene: Secondary | ICD-10-CM | POA: Diagnosis not present

## 2014-12-22 DIAGNOSIS — B351 Tinea unguium: Secondary | ICD-10-CM | POA: Diagnosis not present

## 2014-12-22 NOTE — Patient Instructions (Signed)
Diabetes and Foot Care Diabetes may cause you to have problems because of poor blood supply (circulation) to your feet and legs. This may cause the skin on your feet to become thinner, break easier, and heal more slowly. Your skin may become dry, and the skin may peel and crack. You may also have nerve damage in your legs and feet causing decreased feeling in them. You may not notice minor injuries to your feet that could lead to infections or more serious problems. Taking care of your feet is one of the most important things you can do for yourself.  HOME CARE INSTRUCTIONS  Wear shoes at all times, even in the house. Do not go barefoot. Bare feet are easily injured.  Check your feet daily for blisters, cuts, and redness. If you cannot see the bottom of your feet, use a mirror or ask someone for help.  Wash your feet with warm water (do not use hot water) and mild soap. Then pat your feet and the areas between your toes until they are completely dry. Do not soak your feet as this can dry your skin.  Apply a moisturizing lotion or petroleum jelly (that does not contain alcohol and is unscented) to the skin on your feet and to dry, brittle toenails. Do not apply lotion between your toes.  Trim your toenails straight across. Do not dig under them or around the cuticle. File the edges of your nails with an emery board or nail file.  Do not cut corns or calluses or try to remove them with medicine.  Wear clean socks or stockings every day. Make sure they are not too tight. Do not wear knee-high stockings since they may decrease blood flow to your legs.  Wear shoes that fit properly and have enough cushioning. To break in new shoes, wear them for just a few hours a day. This prevents you from injuring your feet. Always look in your shoes before you put them on to be sure there are no objects inside.  Do not cross your legs. This may decrease the blood flow to your feet.  If you find a minor scrape,  cut, or break in the skin on your feet, keep it and the skin around it clean and dry. These areas may be cleansed with mild soap and water. Do not cleanse the area with peroxide, alcohol, or iodine.  When you remove an adhesive bandage, be sure not to damage the skin around it.  If you have a wound, look at it several times a day to make sure it is healing.  Do not use heating pads or hot water bottles. They may burn your skin. If you have lost feeling in your feet or legs, you may not know it is happening until it is too late.  Make sure your health care provider performs a complete foot exam at least annually or more often if you have foot problems. Report any cuts, sores, or bruises to your health care provider immediately. SEEK MEDICAL CARE IF:   You have an injury that is not healing.  You have cuts or breaks in the skin.  You have an ingrown nail.  You notice redness on your legs or feet.  You feel burning or tingling in your legs or feet.  You have pain or cramps in your legs and feet.  Your legs or feet are numb.  Your feet always feel cold. SEEK IMMEDIATE MEDICAL CARE IF:   There is increasing redness,   swelling, or pain in or around a wound.  There is a red line that goes up your leg.  Pus is coming from a wound.  You develop a fever or as directed by your health care provider.  You notice a bad smell coming from an ulcer or wound. Document Released: 06/09/2000 Document Revised: 02/12/2013 Document Reviewed: 11/19/2012 Exit[employer] Patient Information 2015 [employer]. This information is not intended to replace advice given to you by your health care provider. Make sure you discuss any questions you have with your health care provider.  

## 2014-12-22 NOTE — Progress Notes (Signed)
Patient ID: [name], male   DOB: 07/13/1933, 81 y.o.   MRN: [id number]  Subjective: This patient presents day questing debridement of painful toenails  Objective: The toenails are elongated, brittle, discolored, hypertrophic 6-10  Assessment: Onychomycoses 6-10 Diabetic with a history of peripheral arterial disease  Plan: Debridement of toenails 10 without any bleeding  Reappoint 3 months

## 2015-01-01 ENCOUNTER — Other Ambulatory Visit (INDEPENDENT_AMBULATORY_CARE_PROVIDER_SITE_OTHER): Payer: Medicare Other | Admitting: *Deleted

## 2015-01-01 ENCOUNTER — Encounter: Payer: Self-pay | Admitting: *Deleted

## 2015-01-01 ENCOUNTER — Telehealth: Payer: Self-pay | Admitting: Nurse Practitioner

## 2015-01-01 ENCOUNTER — Ambulatory Visit (INDEPENDENT_AMBULATORY_CARE_PROVIDER_SITE_OTHER): Payer: Medicare Other | Admitting: *Deleted

## 2015-01-01 ENCOUNTER — Telehealth: Payer: Self-pay

## 2015-01-01 VITALS — BP 145/50 | HR 47 | Ht 70.0 in | Wt 187.4 lb

## 2015-01-01 DIAGNOSIS — I10 Essential (primary) hypertension: Secondary | ICD-10-CM

## 2015-01-01 LAB — BASIC METABOLIC PANEL
BUN: 44 mg/dL — ABNORMAL HIGH (ref 6–23)
CO2: 32 meq/L (ref 19–32)
Calcium: 9.2 mg/dL (ref 8.4–10.5)
Chloride: 100 mEq/L (ref 96–112)
Creatinine, Ser: 2.45 mg/dL — ABNORMAL HIGH (ref 0.40–1.50)
GFR: 27.24 mL/min — AB (ref >60.00)
Glucose, Bld: 48 mg/dL — CL (ref 70–99)
Potassium: 4.8 mEq/L (ref 3.5–5.1)
Sodium: 137 mEq/L (ref 135–145)

## 2015-01-01 NOTE — Progress Notes (Signed)
1.) Reason for visit: blood pressure check   Name of MD requesting visit: Nahser  2.) H&P: Hypertension, Clonidine discontinued at last office visit (12/02/14) Cardura 8mg  daily started  3.) ROS related to problem: no symptoms  4.) Assessment and plan per MD:   Copy of home blood pressures given to Dr. Elmarie Shiley nurse Sharyn Lull Swinyer RN  Horton Chin RN

## 2015-01-01 NOTE — Telephone Encounter (Signed)
Per Dr. Acie Fredrickson and review of patient's BP check today, patient should continue current medications

## 2015-01-01 NOTE — Telephone Encounter (Signed)
Lab reported critical glucose done at 0900 of 48.   Seen for nurse check after lab draw and he was awake alert without complaints  Wife said that he had his insulin 2 hours before his lab work.  Called Dr. Buel Ream office, left VM with D.J the nurse.  Told wife to check blood sugar again and to consider adding a snack this evening. She said that he is doing fine and has ate.  Dr. Cathie Olden reviewed lab result at 1:50 01/01/15

## 2015-01-13 ENCOUNTER — Telehealth: Payer: Self-pay | Admitting: Cardiovascular Disease

## 2015-01-13 NOTE — Telephone Encounter (Signed)
Spoke with wife with verbal permission from pt. Wife states that pt's BP this morning was 96/40, HR 46. Wife rechecked vitals 30 mins later and BP was 109/46, HR 46. Vitals yesterday 131/58, HR 54 AM and 140/58, HR 53 PM. Denies CP, dizziness, lightheadedness, and SOB. Wife states pt did c/o slight HA "between his eyes" and being fatigued. Wife states that pt did sit outside yesterday "in the heat of the day", under the carport for awhile. Wife states that pt doesn't drink water except at dinner.  Otherwise he just drinks diet mountain dew. Spoke with wife about importance of proper hydration. Wife states that pt's Clonidine was d/c'ed and pt was started on Cardura 8mg  QD at last OV. Will route to Dr, Acie Fredrickson for review and advisement.

## 2015-01-13 NOTE — Telephone Encounter (Signed)
Spoke with wife and informed her that Dr. Acie Fredrickson in agreement with better hydration and avoiding diet mountain dew. Informed wife to continue to monitor BP and HR and let us know if no improvement with hydration. Wife verbalized understanding and was in agreement with this plan.

## 2015-01-13 NOTE — Telephone Encounter (Signed)
Agree with the advice for better hydration  And avoiding diet mountain dew  He may need a pacer at some point. Im not convinced that his dizziness is due to the slow HR  Continue to follow

## 2015-01-13 NOTE — Telephone Encounter (Signed)
New message    Pt c/o BP issue: STAT if pt c/o blurred vision, one-sided weakness or slurred speech  1. What are your last 5 BP readings? 96/40 at 8:45 today; 109/46 at 9:15 today; 131/58 @ yesterday 8:15am; 140/58 @ yesterday @ 5:55  2. Are you having any other symptoms (ex. Dizziness, headache, blurred vision, passed out)? Tired  3. What is your BP issue? Running lower than normal  Please call to discuss

## 2015-01-14 ENCOUNTER — Ambulatory Visit (INDEPENDENT_AMBULATORY_CARE_PROVIDER_SITE_OTHER): Payer: Medicare Other | Admitting: Pharmacist

## 2015-01-14 DIAGNOSIS — I481 Persistent atrial fibrillation: Secondary | ICD-10-CM | POA: Diagnosis not present

## 2015-01-14 DIAGNOSIS — I4891 Unspecified atrial fibrillation: Secondary | ICD-10-CM | POA: Diagnosis not present

## 2015-01-14 DIAGNOSIS — Z5181 Encounter for therapeutic drug level monitoring: Secondary | ICD-10-CM | POA: Diagnosis not present

## 2015-01-14 DIAGNOSIS — I4819 Other persistent atrial fibrillation: Secondary | ICD-10-CM

## 2015-01-14 LAB — POCT INR: INR: 2.9

## 2015-02-02 DIAGNOSIS — E119 Type 2 diabetes mellitus without complications: Secondary | ICD-10-CM | POA: Diagnosis not present

## 2015-02-02 DIAGNOSIS — I129 Hypertensive chronic kidney disease with stage 1 through stage 4 chronic kidney disease, or unspecified chronic kidney disease: Secondary | ICD-10-CM | POA: Diagnosis not present

## 2015-02-02 DIAGNOSIS — D649 Anemia, unspecified: Secondary | ICD-10-CM | POA: Diagnosis not present

## 2015-02-02 DIAGNOSIS — N184 Chronic kidney disease, stage 4 (severe): Secondary | ICD-10-CM | POA: Diagnosis not present

## 2015-02-11 ENCOUNTER — Ambulatory Visit (INDEPENDENT_AMBULATORY_CARE_PROVIDER_SITE_OTHER): Payer: Medicare Other | Admitting: Pharmacist

## 2015-02-11 DIAGNOSIS — I481 Persistent atrial fibrillation: Secondary | ICD-10-CM | POA: Diagnosis not present

## 2015-02-11 DIAGNOSIS — Z5181 Encounter for therapeutic drug level monitoring: Secondary | ICD-10-CM | POA: Diagnosis not present

## 2015-02-11 DIAGNOSIS — I4891 Unspecified atrial fibrillation: Secondary | ICD-10-CM

## 2015-02-11 DIAGNOSIS — I4819 Other persistent atrial fibrillation: Secondary | ICD-10-CM

## 2015-02-11 LAB — POCT INR: INR: 2.8

## 2015-02-13 ENCOUNTER — Other Ambulatory Visit: Payer: Self-pay | Admitting: Cardiology

## 2015-02-13 ENCOUNTER — Other Ambulatory Visit: Payer: Self-pay | Admitting: Cardiovascular Disease

## 2015-02-26 ENCOUNTER — Other Ambulatory Visit: Payer: Self-pay | Admitting: Cardiovascular Disease

## 2015-02-28 IMAGING — CR DG TOE GREAT 2+V*L*
3 series · 3 of 3 positions shown · non-contrast
Comparison: None.

CLINICAL DATA: 80-year-old male with soft tissue wound on the left
great toe suspicious for osteomyelitis. Initial encounter.

EXAM:
LEFT GREAT TOE

[t toes ap left]
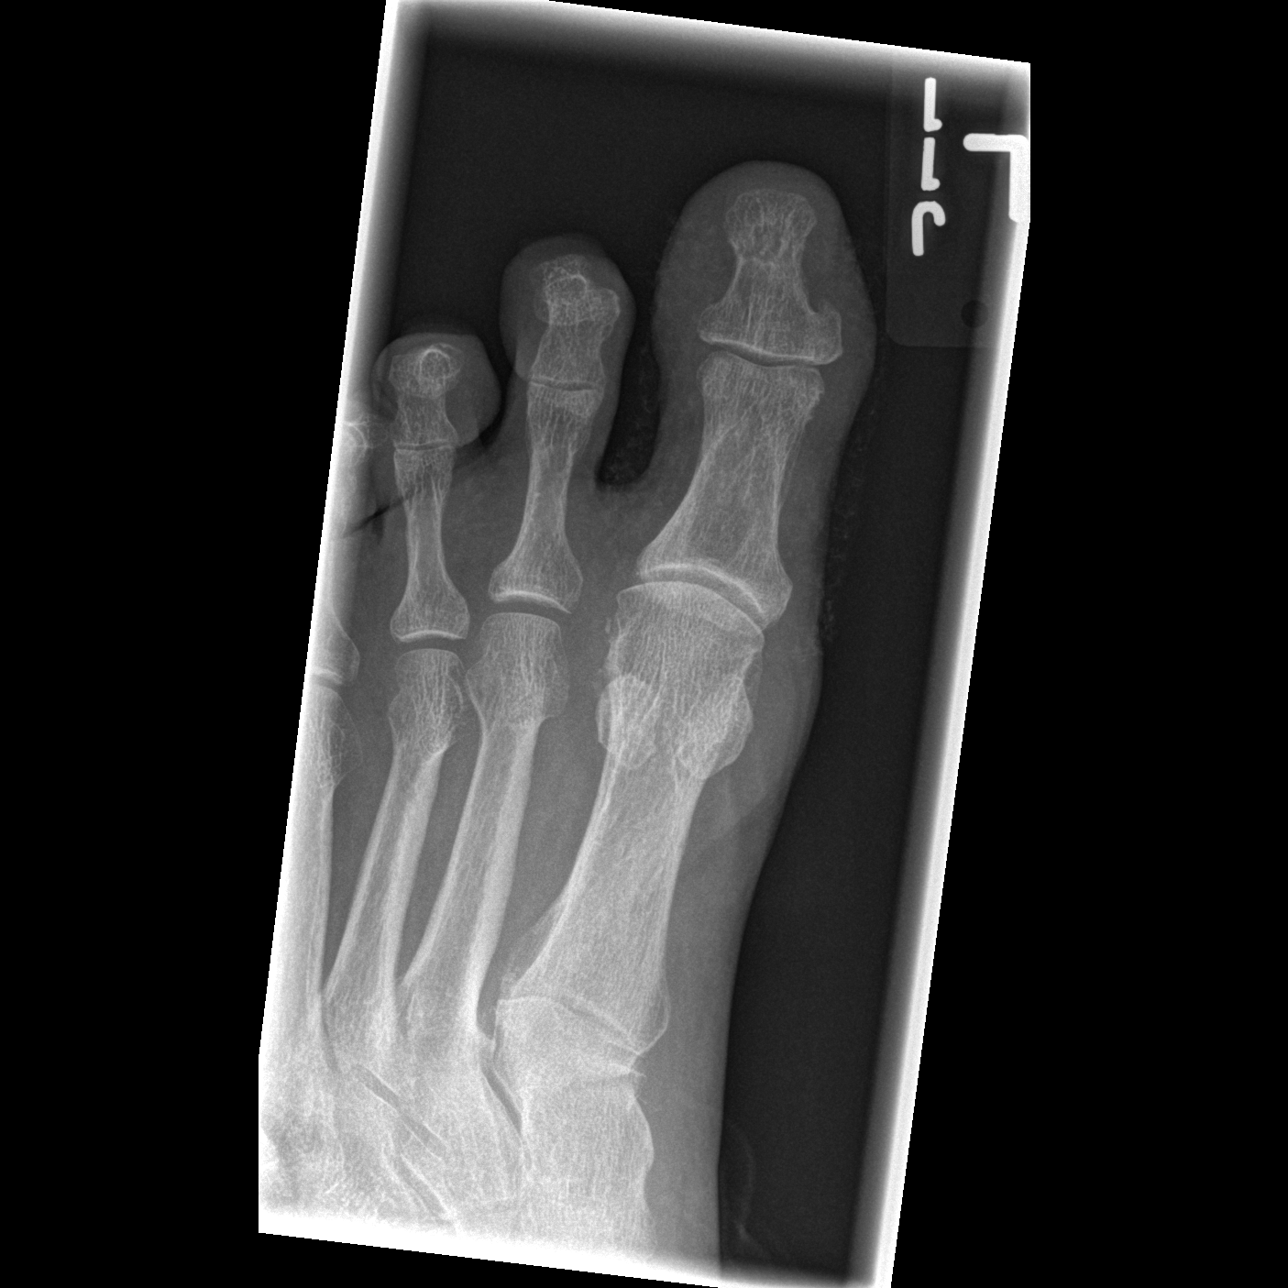

[t toes oblique left]
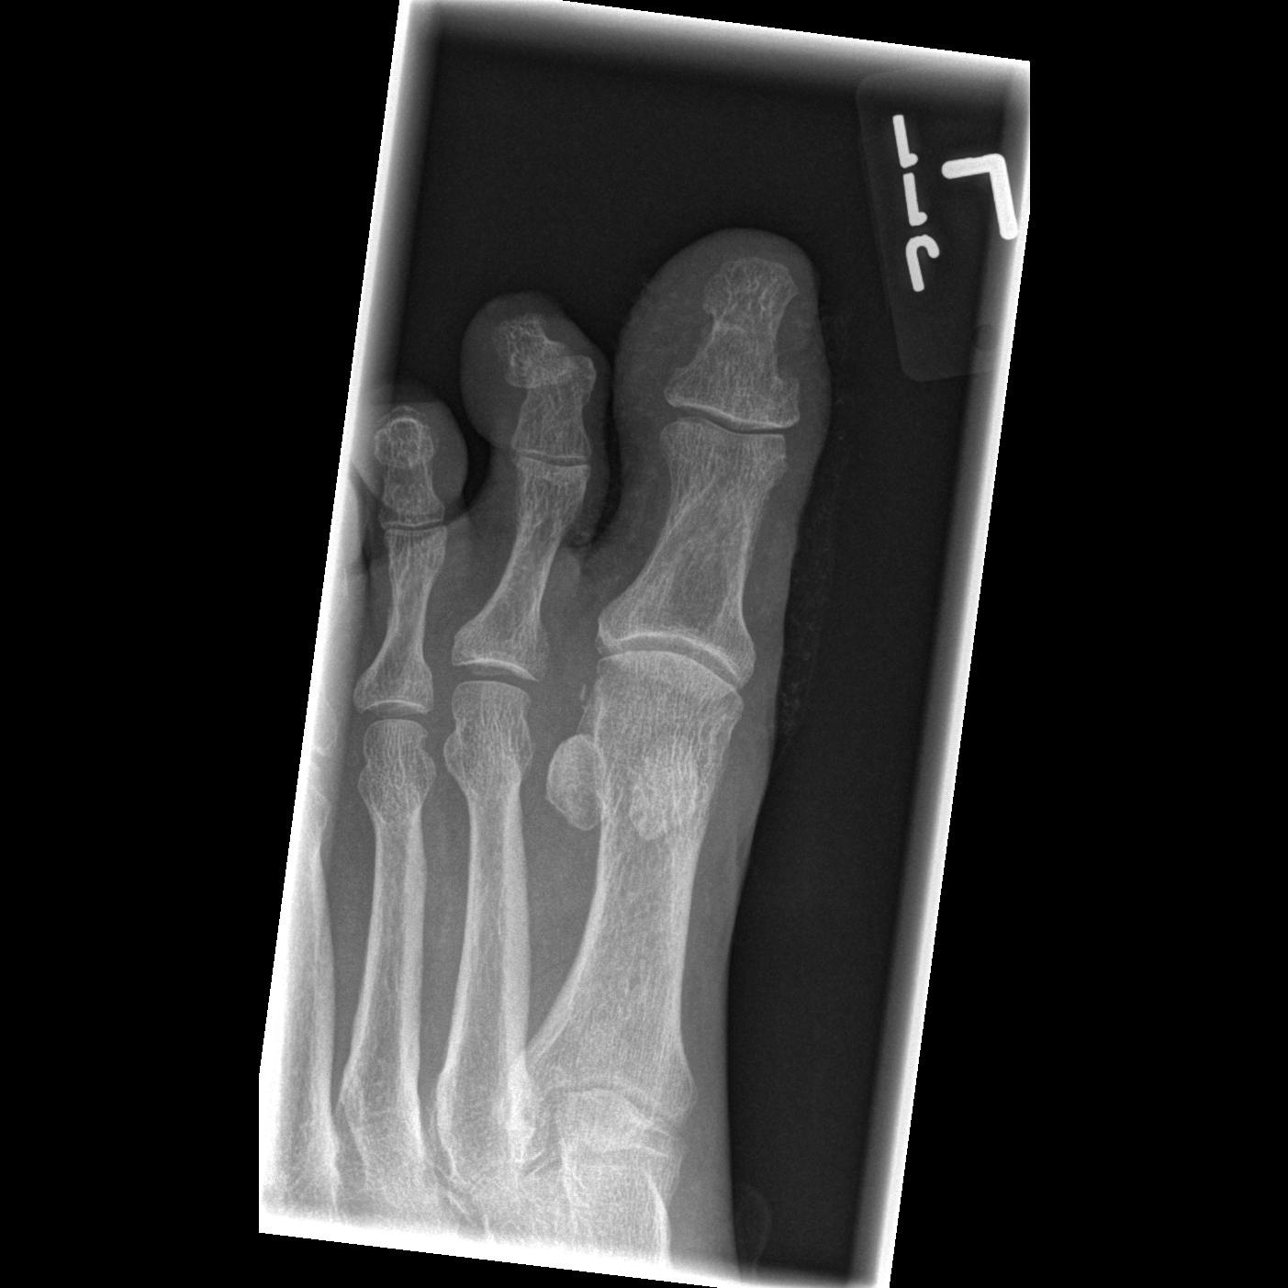

[t toes lateral left]
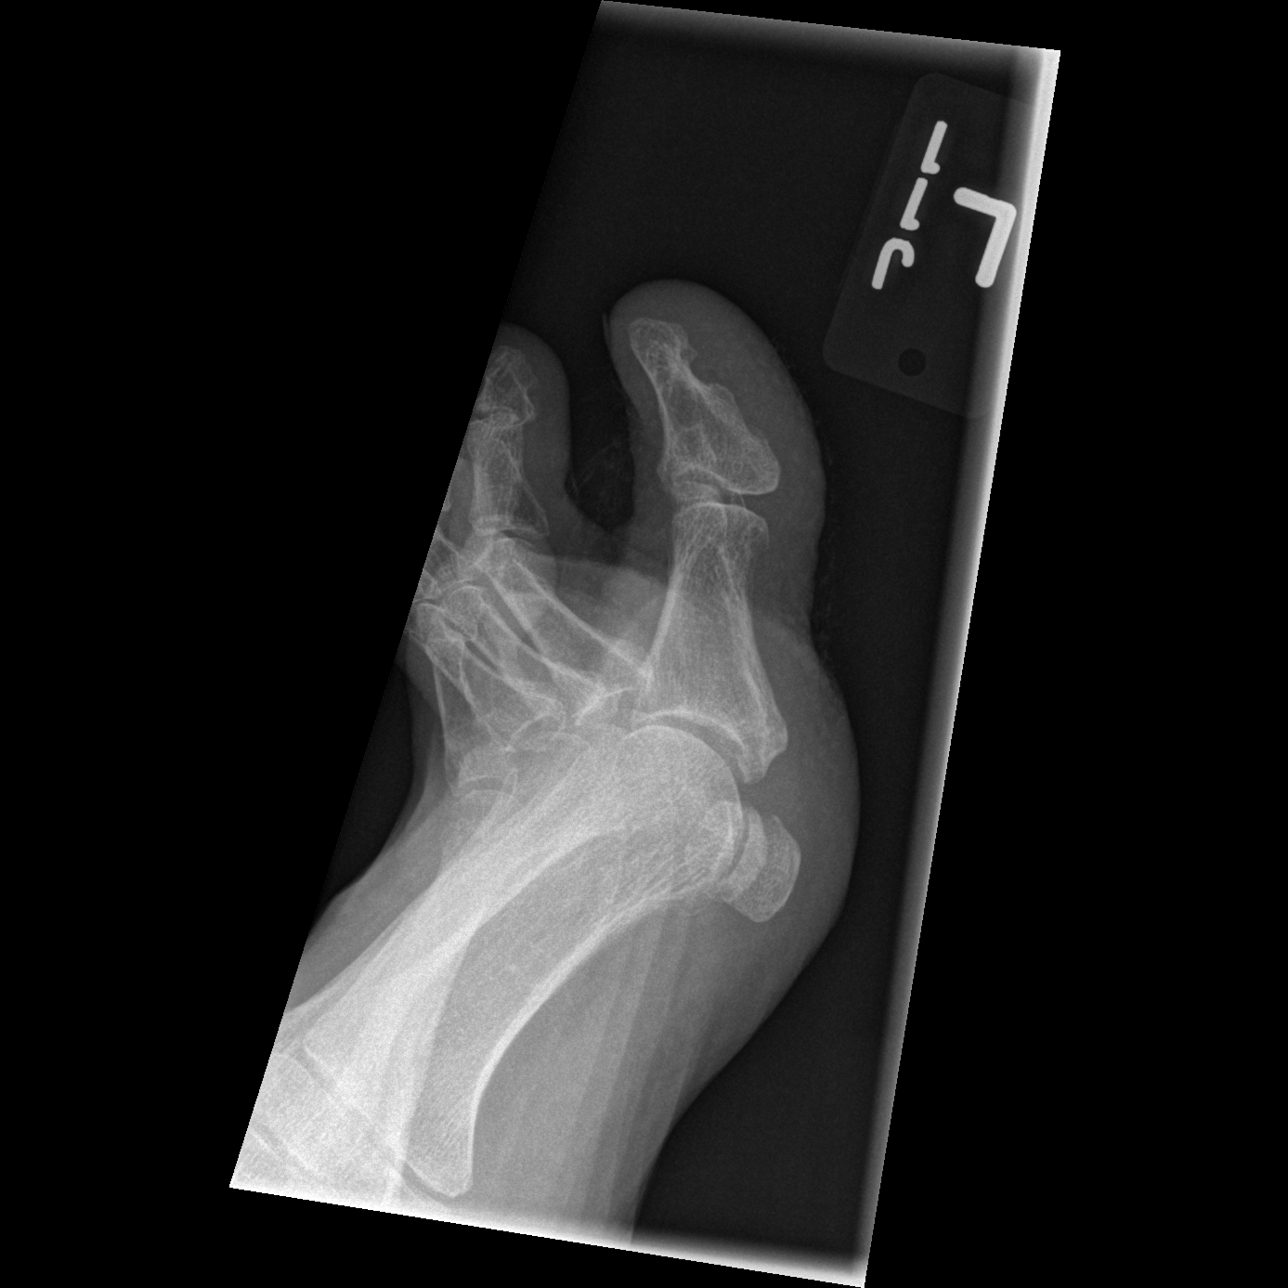

[3 of 3 positions shown; findings below may reference images not displayed]

FINDINGS: Dressing material about the left great toe. Osteopenia. No
subcutaneous gas. No fracture or dislocation. No osteolysis
identified.
IMPRESSION: Osteopenia. No plain radiographic evidence of left great toe
osteomyelitis.

## 2015-03-03 DIAGNOSIS — E1165 Type 2 diabetes mellitus with hyperglycemia: Secondary | ICD-10-CM | POA: Diagnosis not present

## 2015-03-03 DIAGNOSIS — I10 Essential (primary) hypertension: Secondary | ICD-10-CM | POA: Diagnosis not present

## 2015-03-03 DIAGNOSIS — Z23 Encounter for immunization: Secondary | ICD-10-CM | POA: Diagnosis not present

## 2015-03-03 DIAGNOSIS — N185 Chronic kidney disease, stage 5: Secondary | ICD-10-CM | POA: Diagnosis not present

## 2015-03-03 DIAGNOSIS — Z6828 Body mass index (BMI) 28.0-28.9, adult: Secondary | ICD-10-CM | POA: Diagnosis not present

## 2015-03-10 DIAGNOSIS — I10 Essential (primary) hypertension: Secondary | ICD-10-CM | POA: Diagnosis not present

## 2015-03-10 DIAGNOSIS — I2510 Atherosclerotic heart disease of native coronary artery without angina pectoris: Secondary | ICD-10-CM | POA: Diagnosis not present

## 2015-03-10 DIAGNOSIS — Z6829 Body mass index (BMI) 29.0-29.9, adult: Secondary | ICD-10-CM | POA: Diagnosis not present

## 2015-03-10 DIAGNOSIS — E1151 Type 2 diabetes mellitus with diabetic peripheral angiopathy without gangrene: Secondary | ICD-10-CM | POA: Diagnosis not present

## 2015-03-10 DIAGNOSIS — I739 Peripheral vascular disease, unspecified: Secondary | ICD-10-CM | POA: Diagnosis not present

## 2015-03-10 DIAGNOSIS — N185 Chronic kidney disease, stage 5: Secondary | ICD-10-CM | POA: Diagnosis not present

## 2015-03-10 DIAGNOSIS — G309 Alzheimer's disease, unspecified: Secondary | ICD-10-CM | POA: Diagnosis not present

## 2015-03-24 ENCOUNTER — Encounter: Payer: Self-pay | Admitting: Podiatry

## 2015-03-24 ENCOUNTER — Ambulatory Visit (INDEPENDENT_AMBULATORY_CARE_PROVIDER_SITE_OTHER): Payer: Medicare Other | Admitting: Podiatry

## 2015-03-24 DIAGNOSIS — E1151 Type 2 diabetes mellitus with diabetic peripheral angiopathy without gangrene: Secondary | ICD-10-CM

## 2015-03-24 DIAGNOSIS — M79676 Pain in unspecified toe(s): Secondary | ICD-10-CM | POA: Diagnosis not present

## 2015-03-24 DIAGNOSIS — B351 Tinea unguium: Secondary | ICD-10-CM

## 2015-03-24 NOTE — Patient Instructions (Signed)
Diabetes and Foot Care Diabetes may cause you to have problems because of poor blood supply (circulation) to your feet and legs. This may cause the skin on your feet to become thinner, break easier, and heal more slowly. Your skin may become dry, and the skin may peel and crack. You may also have nerve damage in your legs and feet causing decreased feeling in them. You may not notice minor injuries to your feet that could lead to infections or more serious problems. Taking care of your feet is one of the most important things you can do for yourself.  HOME CARE INSTRUCTIONS  Wear shoes at all times, even in the house. Do not go barefoot. Bare feet are easily injured.  Check your feet daily for blisters, cuts, and redness. If you cannot see the bottom of your feet, use a mirror or ask someone for help.  Wash your feet with warm water (do not use hot water) and mild soap. Then pat your feet and the areas between your toes until they are completely dry. Do not soak your feet as this can dry your skin.  Apply a moisturizing lotion or petroleum jelly (that does not contain alcohol and is unscented) to the skin on your feet and to dry, brittle toenails. Do not apply lotion between your toes.  Trim your toenails straight across. Do not dig under them or around the cuticle. File the edges of your nails with an emery board or nail file.  Do not cut corns or calluses or try to remove them with medicine.  Wear clean socks or stockings every day. Make sure they are not too tight. Do not wear knee-high stockings since they may decrease blood flow to your legs.  Wear shoes that fit properly and have enough cushioning. To break in new shoes, wear them for just a few hours a day. This prevents you from injuring your feet. Always look in your shoes before you put them on to be sure there are no objects inside.  Do not cross your legs. This may decrease the blood flow to your feet.  If you find a minor scrape,  cut, or break in the skin on your feet, keep it and the skin around it clean and dry. These areas may be cleansed with mild soap and water. Do not cleanse the area with peroxide, alcohol, or iodine.  When you remove an adhesive bandage, be sure not to damage the skin around it.  If you have a wound, look at it several times a day to make sure it is healing.  Do not use heating pads or hot water bottles. They may burn your skin. If you have lost feeling in your feet or legs, you may not know it is happening until it is too late.  Make sure your health care provider performs a complete foot exam at least annually or more often if you have foot problems. Report any cuts, sores, or bruises to your health care provider immediately. SEEK MEDICAL CARE IF:   You have an injury that is not healing.  You have cuts or breaks in the skin.  You have an ingrown nail.  You notice redness on your legs or feet.  You feel burning or tingling in your legs or feet.  You have pain or cramps in your legs and feet.  Your legs or feet are numb.  Your feet always feel cold. SEEK IMMEDIATE MEDICAL CARE IF:   There is increasing redness,   swelling, or pain in or around a wound.  There is a red line that goes up your leg.  Pus is coming from a wound.  You develop a fever or as directed by your health care provider.  You notice a bad smell coming from an ulcer or wound. Document Released: 06/09/2000 Document Revised: 02/12/2013 Document Reviewed: 11/19/2012 Exit[employer] Patient Information 2015 [employer]. This information is not intended to replace advice given to you by your health care provider. Make sure you discuss any questions you have with your health care provider.  

## 2015-03-24 NOTE — Progress Notes (Signed)
Patient ID: [name], male   DOB: [birth year], 81 y.o.   MRN: [id number]  Subjective: This patient presents for scheduled visit complaining of painful toenails and requesting nail debridement  Objective: Orientated 3 patient presents with wife and treatment room The toenails are elongated, brittle, incurvated, discolored, hypertrophic and tender to direct palpation 6-10  Assessment: Symptomatic onychomycoses 6-10 Diabetic with peripheral arterial disease  Plan: Debrided toenails 10 and mechanically and electrically without any bleeding  Reappoint 3 months

## 2015-03-25 ENCOUNTER — Ambulatory Visit (INDEPENDENT_AMBULATORY_CARE_PROVIDER_SITE_OTHER): Payer: Medicare Other

## 2015-03-25 DIAGNOSIS — Z5181 Encounter for therapeutic drug level monitoring: Secondary | ICD-10-CM | POA: Diagnosis not present

## 2015-03-25 DIAGNOSIS — I4819 Other persistent atrial fibrillation: Secondary | ICD-10-CM

## 2015-03-25 DIAGNOSIS — I4891 Unspecified atrial fibrillation: Secondary | ICD-10-CM | POA: Diagnosis not present

## 2015-03-25 DIAGNOSIS — I481 Persistent atrial fibrillation: Secondary | ICD-10-CM | POA: Diagnosis not present

## 2015-03-25 LAB — POCT INR: INR: 4.4

## 2015-03-26 ENCOUNTER — Other Ambulatory Visit: Payer: Self-pay | Admitting: Cardiovascular Disease

## 2015-04-08 ENCOUNTER — Ambulatory Visit (INDEPENDENT_AMBULATORY_CARE_PROVIDER_SITE_OTHER): Payer: Medicare Other | Admitting: *Deleted

## 2015-04-08 DIAGNOSIS — I4819 Other persistent atrial fibrillation: Secondary | ICD-10-CM

## 2015-04-08 DIAGNOSIS — I4891 Unspecified atrial fibrillation: Secondary | ICD-10-CM

## 2015-04-08 DIAGNOSIS — Z5181 Encounter for therapeutic drug level monitoring: Secondary | ICD-10-CM | POA: Diagnosis not present

## 2015-04-08 DIAGNOSIS — I481 Persistent atrial fibrillation: Secondary | ICD-10-CM | POA: Diagnosis not present

## 2015-04-08 LAB — POCT INR: INR: 4

## 2015-04-22 ENCOUNTER — Ambulatory Visit (INDEPENDENT_AMBULATORY_CARE_PROVIDER_SITE_OTHER): Payer: Medicare Other | Admitting: *Deleted

## 2015-04-22 DIAGNOSIS — Z5181 Encounter for therapeutic drug level monitoring: Secondary | ICD-10-CM

## 2015-04-22 DIAGNOSIS — I4819 Other persistent atrial fibrillation: Secondary | ICD-10-CM

## 2015-04-22 DIAGNOSIS — I4891 Unspecified atrial fibrillation: Secondary | ICD-10-CM | POA: Diagnosis not present

## 2015-04-22 DIAGNOSIS — I481 Persistent atrial fibrillation: Secondary | ICD-10-CM | POA: Diagnosis not present

## 2015-04-22 LAB — POCT INR: INR: 3

## 2015-04-30 ENCOUNTER — Other Ambulatory Visit: Payer: Self-pay | Admitting: Cardiovascular Disease

## 2015-05-04 DIAGNOSIS — M5441 Lumbago with sciatica, right side: Secondary | ICD-10-CM | POA: Diagnosis not present

## 2015-05-04 DIAGNOSIS — M1711 Unilateral primary osteoarthritis, right knee: Secondary | ICD-10-CM | POA: Diagnosis not present

## 2015-05-04 DIAGNOSIS — M25551 Pain in right hip: Secondary | ICD-10-CM | POA: Diagnosis not present

## 2015-05-10 DIAGNOSIS — I739 Peripheral vascular disease, unspecified: Secondary | ICD-10-CM | POA: Diagnosis not present

## 2015-05-10 DIAGNOSIS — G309 Alzheimer's disease, unspecified: Secondary | ICD-10-CM | POA: Diagnosis not present

## 2015-05-10 DIAGNOSIS — Z6830 Body mass index (BMI) 30.0-30.9, adult: Secondary | ICD-10-CM | POA: Diagnosis not present

## 2015-05-10 DIAGNOSIS — R21 Rash and other nonspecific skin eruption: Secondary | ICD-10-CM | POA: Diagnosis not present

## 2015-05-10 DIAGNOSIS — R310 Gross hematuria: Secondary | ICD-10-CM | POA: Diagnosis not present

## 2015-05-13 ENCOUNTER — Ambulatory Visit (INDEPENDENT_AMBULATORY_CARE_PROVIDER_SITE_OTHER): Payer: Medicare Other | Admitting: *Deleted

## 2015-05-13 DIAGNOSIS — Z5181 Encounter for therapeutic drug level monitoring: Secondary | ICD-10-CM

## 2015-05-13 DIAGNOSIS — I481 Persistent atrial fibrillation: Secondary | ICD-10-CM | POA: Diagnosis not present

## 2015-05-13 DIAGNOSIS — I4891 Unspecified atrial fibrillation: Secondary | ICD-10-CM

## 2015-05-13 DIAGNOSIS — I4819 Other persistent atrial fibrillation: Secondary | ICD-10-CM

## 2015-05-13 LAB — POCT INR: INR: 3.7

## 2015-05-14 DIAGNOSIS — N184 Chronic kidney disease, stage 4 (severe): Secondary | ICD-10-CM | POA: Diagnosis not present

## 2015-05-18 DIAGNOSIS — S93601A Unspecified sprain of right foot, initial encounter: Secondary | ICD-10-CM | POA: Diagnosis not present

## 2015-05-19 DIAGNOSIS — M109 Gout, unspecified: Secondary | ICD-10-CM | POA: Diagnosis not present

## 2015-05-19 DIAGNOSIS — R21 Rash and other nonspecific skin eruption: Secondary | ICD-10-CM | POA: Diagnosis not present

## 2015-05-19 DIAGNOSIS — E039 Hypothyroidism, unspecified: Secondary | ICD-10-CM | POA: Diagnosis not present

## 2015-05-19 DIAGNOSIS — I129 Hypertensive chronic kidney disease with stage 1 through stage 4 chronic kidney disease, or unspecified chronic kidney disease: Secondary | ICD-10-CM | POA: Diagnosis not present

## 2015-05-19 DIAGNOSIS — D649 Anemia, unspecified: Secondary | ICD-10-CM | POA: Diagnosis not present

## 2015-05-19 DIAGNOSIS — I4891 Unspecified atrial fibrillation: Secondary | ICD-10-CM | POA: Diagnosis not present

## 2015-05-19 DIAGNOSIS — R413 Other amnesia: Secondary | ICD-10-CM | POA: Diagnosis not present

## 2015-05-19 DIAGNOSIS — N184 Chronic kidney disease, stage 4 (severe): Secondary | ICD-10-CM | POA: Diagnosis not present

## 2015-05-19 DIAGNOSIS — I770 Arteriovenous fistula, acquired: Secondary | ICD-10-CM | POA: Diagnosis not present

## 2015-05-19 DIAGNOSIS — E119 Type 2 diabetes mellitus without complications: Secondary | ICD-10-CM | POA: Diagnosis not present

## 2015-05-19 DIAGNOSIS — Z794 Long term (current) use of insulin: Secondary | ICD-10-CM | POA: Diagnosis not present

## 2015-05-27 ENCOUNTER — Ambulatory Visit (INDEPENDENT_AMBULATORY_CARE_PROVIDER_SITE_OTHER): Payer: Medicare Other | Admitting: *Deleted

## 2015-05-27 DIAGNOSIS — I4891 Unspecified atrial fibrillation: Secondary | ICD-10-CM | POA: Diagnosis not present

## 2015-05-27 DIAGNOSIS — Z5181 Encounter for therapeutic drug level monitoring: Secondary | ICD-10-CM

## 2015-05-27 DIAGNOSIS — I481 Persistent atrial fibrillation: Secondary | ICD-10-CM

## 2015-05-27 DIAGNOSIS — I4819 Other persistent atrial fibrillation: Secondary | ICD-10-CM

## 2015-05-27 LAB — POCT INR: INR: 2.7

## 2015-05-28 ENCOUNTER — Encounter: Payer: Self-pay | Admitting: Family

## 2015-06-01 DIAGNOSIS — L570 Actinic keratosis: Secondary | ICD-10-CM | POA: Diagnosis not present

## 2015-06-01 DIAGNOSIS — L308 Other specified dermatitis: Secondary | ICD-10-CM | POA: Diagnosis not present

## 2015-06-01 DIAGNOSIS — L810 Postinflammatory hyperpigmentation: Secondary | ICD-10-CM | POA: Diagnosis not present

## 2015-06-02 ENCOUNTER — Ambulatory Visit (HOSPITAL_COMMUNITY)
Admission: RE | Admit: 2015-06-02 | Discharge: 2015-06-02 | Disposition: A | Discharge status: Home / Self Care | Payer: Medicare Other | Source: Ambulatory Visit | Attending: Family | Admitting: Family

## 2015-06-02 ENCOUNTER — Ambulatory Visit (INDEPENDENT_AMBULATORY_CARE_PROVIDER_SITE_OTHER): Payer: Medicare Other | Admitting: Family

## 2015-06-02 ENCOUNTER — Encounter: Payer: Self-pay | Admitting: Family

## 2015-06-02 VITALS — BP 156/59 | HR 58 | Ht 70.0 in | Wt 189.5 lb

## 2015-06-02 DIAGNOSIS — Z9889 Other specified postprocedural states: Secondary | ICD-10-CM | POA: Diagnosis not present

## 2015-06-02 DIAGNOSIS — I770 Arteriovenous fistula, acquired: Secondary | ICD-10-CM

## 2015-06-02 DIAGNOSIS — S93411D Sprain of calcaneofibular ligament of right ankle, subsequent encounter: Secondary | ICD-10-CM | POA: Diagnosis not present

## 2015-06-02 DIAGNOSIS — I779 Disorder of arteries and arterioles, unspecified: Secondary | ICD-10-CM | POA: Diagnosis not present

## 2015-06-02 DIAGNOSIS — M79671 Pain in right foot: Secondary | ICD-10-CM | POA: Diagnosis not present

## 2015-06-02 DIAGNOSIS — N184 Chronic kidney disease, stage 4 (severe): Secondary | ICD-10-CM

## 2015-06-02 DIAGNOSIS — I6523 Occlusion and stenosis of bilateral carotid arteries: Secondary | ICD-10-CM | POA: Diagnosis not present

## 2015-06-02 DIAGNOSIS — Z48812 Encounter for surgical aftercare following surgery on the circulatory system: Secondary | ICD-10-CM

## 2015-06-02 NOTE — Patient Instructions (Signed)
Stroke Prevention Some medical conditions and behaviors are associated with an increased chance of having a stroke. You may prevent a stroke by making healthy choices and managing medical conditions. HOW CAN I REDUCE MY RISK OF HAVING A STROKE?   Stay physically active. Get at least 30 minutes of activity on most or all days.  Do not smoke. It may also be helpful to avoid exposure to secondhand smoke.  Limit alcohol use. Moderate alcohol use is considered to be:  No more than 2 drinks per day for men.  No more than 1 drink per day for nonpregnant women.  Eat healthy foods. This involves:  Eating 5 or more servings of fruits and vegetables a day.  Making dietary changes that address high blood pressure (hypertension), high cholesterol, diabetes, or obesity.  Manage your cholesterol levels.  Making food choices that are high in fiber and low in saturated fat, trans fat, and cholesterol may control cholesterol levels.  Take any prescribed medicines to control cholesterol as directed by your health care provider.  Manage your diabetes.  Controlling your carbohydrate and sugar intake is recommended to manage diabetes.  Take any prescribed medicines to control diabetes as directed by your health care provider.  Control your hypertension.  Making food choices that are low in salt (sodium), saturated fat, trans fat, and cholesterol is recommended to manage hypertension.  Ask your health care provider if you need treatment to lower your blood pressure. Take any prescribed medicines to control hypertension as directed by your health care provider.  If you are 18-39 years of age, have your blood pressure checked every 3-5 years. If you are 40 years of age or older, have your blood pressure checked every year.  Maintain a healthy weight.  Reducing calorie intake and making food choices that are low in sodium, saturated fat, trans fat, and cholesterol are recommended to manage  weight.  Stop drug abuse.  Avoid taking birth control pills.  Talk to your health care provider about the risks of taking birth control pills if you are over 35 years old, smoke, get migraines, or have ever had a blood clot.  Get evaluated for sleep disorders (sleep apnea).  Talk to your health care provider about getting a sleep evaluation if you snore a lot or have excessive sleepiness.  Take medicines only as directed by your health care provider.  For some people, aspirin or blood thinners (anticoagulants) are helpful in reducing the risk of forming abnormal blood clots that can lead to stroke. If you have the irregular heart rhythm of atrial fibrillation, you should be on a blood thinner unless there is a good reason you cannot take them.  Understand all your medicine instructions.  Make sure that other conditions (such as anemia or atherosclerosis) are addressed. SEEK IMMEDIATE MEDICAL CARE IF:   You have sudden weakness or numbness of the face, arm, or leg, especially on one side of the body.  Your face or eyelid droops to one side.  You have sudden confusion.  You have trouble speaking (aphasia) or understanding.  You have sudden trouble seeing in one or both eyes.  You have sudden trouble walking.  You have dizziness.  You have a loss of balance or coordination.  You have a sudden, severe headache with no known cause.  You have new chest pain or an irregular heartbeat. Any of these symptoms may represent a serious problem that is an emergency. Do not wait to see if the symptoms will   go away. Get medical help at once. Call your local emergency services (911 in [place] Do not drive yourself to the hospital.   This information is not intended to replace advice given to you by your health care provider. Make sure you discuss any questions you have with your health care provider.   Document Released: 07/20/2004 Document Revised: 07/03/2014 Document Reviewed:  12/13/2012 Elsevier Interactive Patient Education 2016 [employer]    Peripheral Vascular Disease Peripheral vascular disease (PVD) is a disease of the blood vessels that are not part of your heart and brain. A simple term for PVD is poor circulation. In most cases, PVD narrows the blood vessels that carry blood from your heart to the rest of your body. This can result in a decreased supply of blood to your arms, legs, and internal organs, like your stomach or kidneys. However, it most often affects a person's lower legs and feet. There are two types of PVD.  Organic PVD. This is the more common type. It is caused by damage to the structure of blood vessels.  Functional PVD. This is caused by conditions that make blood vessels contract and tighten (spasm). Without treatment, PVD tends to get worse over time. PVD can also lead to acute ischemic limb. This is when an arm or limb suddenly has trouble getting enough blood. This is a medical emergency. CAUSES Each type of PVD has many different causes. The most common cause of PVD is buildup of a fatty material (plaque) inside of your arteries (atherosclerosis). Small amounts of plaque can break off from the walls of the blood vessels and become lodged in a smaller artery. This blocks blood flow and can cause acute ischemic limb. Other common causes of PVD include:  Blood clots that form inside of blood vessels.  Injuries to blood vessels.  Diseases that cause inflammation of blood vessels or cause blood vessel spasms.  Health behaviors and health history that increase your risk of developing PVD. RISK FACTORS  You may have a greater risk of PVD if you:  Have a family history of PVD.  Have certain medical conditions, including:  High cholesterol.  Diabetes.  High blood pressure (hypertension).  Coronary heart disease.  Past problems with blood clots.  Past injury, such as burns or a broken bone. These may have damaged blood  vessels in your limbs.  Buerger disease. This is caused by inflamed blood vessels in your hands and feet.  Some forms of arthritis.  Rare birth defects that affect the arteries in your legs.  Use tobacco.  Do not get enough exercise.  Are obese.  Are age 50 or older. SIGNS AND SYMPTOMS  PVD may cause many different symptoms. Your symptoms depend on what part of your body is not getting enough blood. Some common signs and symptoms include:  Cramps in your lower legs. This may be a symptom of poor leg circulation (claudication).  Pain and weakness in your legs while you are physically active that goes away when you rest (intermittent claudication).  Leg pain when at rest.  Leg numbness, tingling, or weakness.  Coldness in a leg or foot, especially when compared with the other leg.  Skin or hair changes. These can include:  Hair loss.  Shiny skin.  Pale or bluish skin.  Thick toenails.  Inability to get or maintain an erection (erectile dysfunction). People with PVD are more prone to developing ulcers and sores on their toes, feet, or legs. These may take longer than   normal to heal. DIAGNOSIS Your health care provider may diagnose PVD from your signs and symptoms. The health care provider will also do a physical exam. You may have tests to find out what is causing your PVD and determine its severity. Tests may include:  Blood pressure recordings from your arms and legs and measurements of the strength of your pulses (pulse volume recordings).  Imaging studies using sound waves to take pictures of the blood flow through your blood vessels (Doppler ultrasound).  Injecting a dye into your blood vessels before having imaging studies using:  X-rays (angiogram or arteriogram).  Computer-generated X-rays (CT angiogram).  A powerful electromagnetic field and a computer (magnetic resonance angiogram or MRA). TREATMENT Treatment for PVD depends on the cause of your condition  and the severity of your symptoms. It also depends on your age. Underlying causes need to be treated and controlled. These include long-lasting (chronic) conditions, such as diabetes, high cholesterol, and high blood pressure. You may need to first try making lifestyle changes and taking medicines. Surgery may be needed if these do not work. Lifestyle changes may include:  Quitting smoking.  Exercising regularly.  Following a low-fat, low-cholesterol diet. Medicines may include:  Blood thinners to prevent blood clots.  Medicines to improve blood flow.  Medicines to improve your blood cholesterol levels. Surgical procedures may include:  A procedure that uses an inflated balloon to open a blocked artery and improve blood flow (angioplasty).  A procedure to put in a tube (stent) to keep a blocked artery open (stent implant).  Surgery to reroute blood flow around a blocked artery (peripheral bypass surgery).  Surgery to remove dead tissue from an infected wound on the affected limb.  Amputation. This is surgical removal of the affected limb. This may be necessary in cases of acute ischemic limb that are not improved through medical or surgical treatments. HOME CARE INSTRUCTIONS  Take medicines only as directed by your health care provider.  Do not use any tobacco products, including cigarettes, chewing tobacco, or electronic cigarettes. If you need help quitting, ask your health care provider.  Lose weight if you are overweight, and maintain a healthy weight as directed by your health care provider.  Eat a diet that is low in fat and cholesterol. If you need help, ask your health care provider.  Exercise regularly. Ask your health care provider to suggest some good activities for you.  Use compression stockings or other mechanical devices as directed by your health care provider.  Take good care of your feet.  Wear comfortable shoes that fit well.  Check your feet often for  any cuts or sores. SEEK MEDICAL CARE IF:  You have cramps in your legs while walking.  You have leg pain when you are at rest.  You have coldness in a leg or foot.  Your skin changes.  You have erectile dysfunction.  You have cuts or sores on your feet that are not healing. SEEK IMMEDIATE MEDICAL CARE IF:  Your arm or leg turns cold and blue.  Your arms or legs become red, warm, swollen, painful, or numb.  You have chest pain or trouble breathing.  You suddenly have weakness in your face, arm, or leg.  You become very confused or lose the ability to speak.  You suddenly have a very bad headache or lose your vision.   This information is not intended to replace advice given to you by your health care provider. Make sure you discuss any questions   you have with your health care provider.   Document Released: 07/20/2004 Document Revised: 07/03/2014 Document Reviewed: 11/20/2013 Elsevier Interactive Patient Education 2016 [employer]  

## 2015-06-02 NOTE — Progress Notes (Signed)
Chief Complaint: Extracranial Carotid Artery Stenosis and PAD   History of Present Illness  ERMON [name] is a 81 y.o. male patient of Dr. Scot [name] who is s/p left brachiocephalic AV fistula on 123456. He is also s/p Right carotid endarterectomy 01/29/2001; Left common carotid artery stenosis repair with interposition graft 12/30/1999; Left carotid patch angioplasty 12/01/1998; Left carotid endarterectomy 04/30/1989. He returns today for carotid artery stenosis follow up. He was having some work done by his podiatrist who was concerned about his circulation. He underwent a Doppler study at another lab which showed an ABI of 67% on the right and 72% on the left.  He denies any history of claudication, although his activity seems fairly limited. He denies any history of rest pain. He denies any nonhealing ulcers. He is non smoker.  With respect to his chronic kidney disease he is not on dialysis, wife states he has 25% kidney function. He also remains asymptomatic from a carotid standpoint. He denies any tingling, numbness, or aching in either hand or arm.  He denies any history of stroke or TIA. Specifically he denies any history of hemiparesis, denies symptoms of aphasia, denies monocular vision loss, denies unilateral facial drooping.  Wife states he cannot have a cardiac cath done due to his degree of kidney failure, he is not yet on dialysis.  Pt reports New Medical or Surgical History: he fell mid November 2016; about 4 days after af the fall he developed right heel pain, is being evaluated by ortho (Dr. Delfino [name]) for this; he is wearing a splint boot. He ambulates with a walker or uses a wheelchair; wife states this has as much to do with his dyspnea as pain in right heel.  She states he is also unsteady on his feet. He is being treated for shingles, rash on his right leg.  He was started on Namenda.   He had a cardioversion early September, 2015.   Pt Diabetic: Yes, wife states  his sugars are doing well.   Pt smoker: non-smoker  Pt meds include: Statin : Yes ASA: No Other anticoagulants/antiplatelets: coumadin for atrial fib   Past Medical History  Diagnosis Date  . Hypertension   . Hyperlipidemia   . Hypothyroidism   . CAD (coronary artery disease)     a. prior CABG in 1997 with redo in 2000. b. last cath in 2008 - managed medically  . Type 2 diabetes mellitus   . Generalized weakness     without overt findings  . Peripheral vascular disease   . Carotid disease, bilateral     with multiple bilateral carotid surgeries  . Psoriasis   . Meniere's disease   . Degenerative joint disease   . Gout   . Orthopnea     Two-pillow  . CKD (chronic kidney disease), stage IV     a. Has fistula in place.   . Chronic diastolic CHF (congestive heart failure)   . GERD (gastroesophageal reflux disease)   . Atrial fibrillation Aug. 2015    a. Dx 01/2014, s/p DCCV 03/06/14.  . Sinus bradycardia     a. Toprol D/C'd due to this.     Social History Social History  Substance Use Topics  . Smoking status: Never Smoker   . Smokeless tobacco: Never Used  . Alcohol Use: No    Family History Family History  Problem Relation Age of Onset  . Heart attack Father   . Heart disease Father     After 10 yrs  of age  . Hyperlipidemia Father   . Hypertension Father   . Diabetes Mother   . Hypertension Mother   . Heart disease Mother   . Heart disease Brother   . Diabetes Brother     Surgical History Past Surgical History  Procedure Laterality Date  . Coronary artery bypass graft  1997 and 2000  . Carotid endarterectomy    . Lung removal, partial    . Laminectomies  July 2001    lumbar laminectomies  . Thoracotomy       left--due to fungal infection  . Shoulder surgery    . Cardiac catheterization    . Appendectomy    . Av fistula placement Left 01/21/2013    Procedure: ARTERIOVENOUS (AV) FISTULA CREATION, Left Brachiocephalic;  Surgeon: Angelia Mould, MD;  Location: [facility];  Service: Vascular;  Laterality: Left;  . Cardiac catheterization  2008    L main irreg, LAD 80%, IMA-LAD & SVG-Diag patent, CFX 100%, SVG-OM 90%, RCA 70%, SVG-RCA OK, EF nl, med rx, no vessels appropriate for PCI  . Tee without cardioversion N/A 03/06/2014    Procedure: TRANSESOPHAGEAL ECHOCARDIOGRAM (TEE);  Surgeon: Peter Dresser, MD;  Location: [facility];  Service: Cardiovascular;  Laterality: N/A;  . Cardioversion N/A 03/06/2014    Procedure: CARDIOVERSION;  Surgeon: Peter Dresser, MD;  Location: [facility];  Service: Cardiovascular;  Laterality: N/A;    Allergies  Allergen Reactions  . Betadine [Povidone Iodine] Rash  . Iodinated Diagnostic Agents Rash and Other (See Comments)    Does fine with premedications for cath  . Latex Hives    Current Outpatient Prescriptions  Medication Sig Dispense Refill  . amiodarone (PACERONE) 200 MG tablet Take 1 tablet (200 mg total) by mouth daily. 60 tablet 5  . amLODipine (NORVASC) 10 MG tablet TAKE ONE TABLET BY MOUTH ONCE DAILY 30 tablet 11  . atorvastatin (LIPITOR) 80 MG tablet TAKE ONE TABLET BY MOUTH ONCE DAILY 90 tablet 3  . cholecalciferol (VITAMIN D) 1000 UNITS tablet Take 1,000 Units by mouth daily at 6 PM.     . doxazosin (CARDURA) 4 MG tablet Take 2 tablets (8 mg total) by mouth daily. 180 tablet 3  . furosemide (LASIX) 80 MG tablet Take 0.5 tablets (40 mg total) by mouth daily as needed for fluid. (Patient taking differently: Take 80 mg by mouth daily. ) 45 tablet 0  . insulin aspart (NOVOLOG FLEXPEN) 100 UNIT/ML SOPN FlexPen Inject 10 Units into the skin 2 (two) times daily.     . Insulin Glargine (LANTUS SOLOSTAR) 100 UNIT/ML SOPN Inject 32 Units into the skin every morning.     . isosorbide mononitrate (IMDUR) 60 MG 24 hr tablet TAKE ONE TABLET BY MOUTH ONCE DAILY 90 tablet 2  . KLOR-CON M20 20 MEQ tablet Take 20 mEq by mouth daily.    Marland Kitchen levothyroxine (SYNTHROID, LEVOTHROID) 175 MCG tablet Take  175 mcg by mouth daily before breakfast.    . memantine (NAMENDA) 5 MG tablet     . Multiple Vitamin (MULTIVITAMIN WITH MINERALS) TABS Take 1 tablet by mouth every evening.     . nitroGLYCERIN (NITROSTAT) 0.4 MG SL tablet Place 1 tablet (0.4 mg total) under the tongue every 5 (five) minutes as needed for chest pain. 25 tablet 3  . polyethylene glycol (MIRALAX / GLYCOLAX) packet Take 17 g by mouth daily as needed for moderate constipation.    . ranitidine (ZANTAC) 300 MG tablet Take 300 mg by mouth 2 (  two) times daily.  30 tablet 11  . silver sulfADIAZINE (SILVADENE) 1 % cream Apply 1 application topically daily. 50 g 0  . traMADol (ULTRAM) 50 MG tablet Take by mouth every 6 (six) hours as needed for moderate pain.    Marland Kitchen trolamine salicylate (ASPERCREME) 10 % cream Apply 1 application topically 2 (two) times daily as needed for muscle pain.     Marland Kitchen warfarin (COUMADIN) 5 MG tablet Take 5 mg by mouth daily.     Marland Kitchen warfarin (COUMADIN) 5 MG tablet TAKE AS DIRECTED 90 tablet 0   No current facility-administered medications for this visit.    Review of Systems : See HPI for pertinent positives and negatives.  Physical Examination  Filed Vitals:   06/02/15 1351  BP: 156/59  Pulse: 58  Height: 5\' 10"  (1.778 m)  Weight: 189 lb 8 oz (85.957 kg)  SpO2: 99%   Body mass index is 27.19 kg/(m^2).   General: WDWN male in NAD, with wife GAIT: normal Eyes: PERRLA Pulmonary: Non-labored, CTAB, no rales,  rhonchi, or wheezing.  Cardiac: regular rhythm, bradycardic,positive detected murmur.  VASCULAR EXAM Carotid Bruits Right Left   Positive Positive   Aorta is not palpable. Radial pulses: right is 2+ palpable, left is 1+ palpable.Left upper arm AVF has a palpable thrill.      LE Pulses Right Left   FEMORAL 2+ palpable 2+ palpable    POPLITEAL  not palpable  not palpable   POSTERIOR TIBIAL  not palpable, audible with Doppler  not palpable, audible with Doppler    DORSALIS PEDIS  ANTERIOR TIBIAL not palpable, audible with Doppler  Not palpable, audible with Doppler     Gastrointestinal: soft, nontender, BS WNL, no r/g, reducible ventral hernia.  Musculoskeletal: Generalized muscle atrophy/wasting/deconditioning. M/S 4/5 throughout, Extremities without ischemic changes. Trace pitting and 1+ non pitting edema in right ankle and foot. Wearing a cast boot that was removed for examination and replaced.  Neurologic: A&O X 2; Appropriate Affect, Speech is normal CN 2-12 intact, Pain and light touch intact in extremities, Motor exam as listed above.        Non-Invasive Vascular Imaging CAROTID DUPLEX 06/02/2015   CEREBROVASCULAR DUPLEX EVALUATION    INDICATION: Carotid artery disease     PREVIOUS INTERVENTION(S): Extensive history of bilateral carotid intervention including bilateral carotid endarterectomy and left interpositional graft placement.HT    DUPLEX EXAM:     RIGHT  LEFT  Peak Systolic Velocities (cm/s) End Diastolic Velocities (cm/s) Plaque LOCATION Peak Systolic Velocities (cm/s) End Diastolic Velocities (cm/s) Plaque  67 6  CCA PROXIMAL 52 12   89 6  CCA MID 63 13   138 11 HT CCA DISTAL 83 18 HT  176 0 HT ECA NV    273 41 CP ICA PROXIMAL 90 16 HT  214 27  ICA MID 104 18   57 13  ICA DISTAL 52 8     NA ICA / CCA Ratio (PSV) NA  Antegrade  Vertebral Flow Antegrade/elevated   Brachial Systolic Pressure (mmHg) AVF  Multiphasic (Subclavian artery) Brachial Artery Waveforms Multiphasic (Subclavian artery)    Plaque Morphology:  HM = Homogeneous, HT = Heterogeneous, CP = Calcific Plaque, SP = Smooth Plaque, IP = Irregular Plaque     ADDITIONAL FINDINGS:     IMPRESSION: Evidence of 40-59% stenosis of the right internal carotid artery. Plaque is densely calcific and difficult to  thoroughly evaluate; velocities may be underestimated. Patent left carotid intervention with minimal plaque formation. Left  external carotid artery could not be visualized and has been reported to be occluded.    Compared to the previous exam:  Could not replicate the velocities and category of stenosis of the right internal carotid artery when compared to the last exam on 11/25/2014; calcific plaque limiting visualization.      Assessment: [name] is a 81 y.o. male who is s/p left brachiocephalic AV fistula on 123456. He is also s/p Right carotid endarterectomy 01/29/2001; Left common carotid artery stenosis repair with interposition graft 12/30/1999; Left carotid patch angioplasty 12/01/1998; Left carotid endarterectomy 04/30/1989. He returns today for carotid artery stenosis follow up. He was having some work done by his podiatrist who was concerned about his circulation. He underwent a Doppler study at another lab which showed an ABI of 67% on the right and 72% on the left.  He denies any history of claudication, although his activity seems fairly limited. He denies any history of rest pain. He denies any nonhealing ulcers. He is non smoker.  With respect to his chronic kidney disease he is not on dialysis, wife states he has 25% kidney function. He also remains asymptomatic from a carotid standpoint. He denies any tingling, numbness, or aching in either hand or arm. Today's carotid duplex suggests 40-59% stenosis of the right internal carotid artery. Plaque is densely calcific and difficult to thoroughly evaluate; velocities may be underestimated. Patent left carotid intervention with minimal plaque formation. Could not replicate the velocities and category of stenosis of the right internal carotid artery when compared to the last exam on 11/25/2014; calcific plaque limiting visualization.  Femoral pulses are 2+ palpable. Pedal pulses are Dopplerable but not palpable. Feet and toes  are pink and warm.  He needed a great deal of help to get on the exam table, seems quite weak and dyspneic with minimal exertion.   Plan: Follow-up in 6 months with Carotid Duplex scan and ABI's.   I discussed in depth with the patient the nature of atherosclerosis, and emphasized the importance of maximal medical management including strict control of blood pressure, blood glucose, and lipid levels, obtaining regular exercise, and continued cessation of smoking.  The patient is aware that without maximal medical management the underlying atherosclerotic disease process will progress, limiting the benefit of any interventions. The patient was given information about stroke prevention and what symptoms should prompt the patient to seek immediate medical care. Thank you for allowing [place] to participate in this patient's care.  Clemon Chambers, RN, MSN, FNP-C [facility] Office: [phone]  Clinic Physician: [name]  06/02/2015 1:14 PM

## 2015-06-03 ENCOUNTER — Encounter: Payer: Self-pay | Admitting: Cardiovascular Disease

## 2015-06-03 ENCOUNTER — Encounter: Payer: Self-pay | Admitting: Family

## 2015-06-03 ENCOUNTER — Other Ambulatory Visit: Payer: Self-pay | Admitting: Orthopedic Surgery

## 2015-06-03 ENCOUNTER — Ambulatory Visit (INDEPENDENT_AMBULATORY_CARE_PROVIDER_SITE_OTHER): Payer: Medicare Other | Admitting: Cardiovascular Disease

## 2015-06-03 VITALS — BP 156/60 | HR 53 | Ht 70.0 in | Wt 189.0 lb

## 2015-06-03 DIAGNOSIS — N185 Chronic kidney disease, stage 5: Secondary | ICD-10-CM | POA: Diagnosis not present

## 2015-06-03 DIAGNOSIS — I10 Essential (primary) hypertension: Secondary | ICD-10-CM | POA: Diagnosis not present

## 2015-06-03 DIAGNOSIS — I480 Paroxysmal atrial fibrillation: Secondary | ICD-10-CM | POA: Diagnosis not present

## 2015-06-03 DIAGNOSIS — Z6828 Body mass index (BMI) 28.0-28.9, adult: Secondary | ICD-10-CM | POA: Diagnosis not present

## 2015-06-03 DIAGNOSIS — R0989 Other specified symptoms and signs involving the circulatory and respiratory systems: Secondary | ICD-10-CM | POA: Insufficient documentation

## 2015-06-03 DIAGNOSIS — I779 Disorder of arteries and arterioles, unspecified: Secondary | ICD-10-CM | POA: Diagnosis not present

## 2015-06-03 DIAGNOSIS — I4891 Unspecified atrial fibrillation: Secondary | ICD-10-CM

## 2015-06-03 DIAGNOSIS — I5042 Chronic combined systolic (congestive) and diastolic (congestive) heart failure: Secondary | ICD-10-CM | POA: Diagnosis not present

## 2015-06-03 DIAGNOSIS — I25810 Atherosclerosis of coronary artery bypass graft(s) without angina pectoris: Secondary | ICD-10-CM | POA: Diagnosis not present

## 2015-06-03 DIAGNOSIS — E1165 Type 2 diabetes mellitus with hyperglycemia: Secondary | ICD-10-CM | POA: Diagnosis not present

## 2015-06-03 DIAGNOSIS — M79671 Pain in right foot: Secondary | ICD-10-CM

## 2015-06-03 NOTE — Patient Instructions (Signed)

## 2015-06-03 NOTE — Progress Notes (Signed)
[name] Date of Birth  1934-05-03 [facility] [address]    [place], [place]  [address] [phone]  Fax  [phone]  Problem list:  1. Coronary artery disease 2. chronic kidney disease 3. Atrophic leg 4. Hypothyroidism  History of Present Illness:  [name] is a 81 year old gentleman with a history of coronary artery disease and coronary artery bypass grafting. He recently presented with some right-sided shoulder pain that radiates through to his chest. He had a stress Myoview study which revealed a small inferolateral defect. He was set up for cardiac catheterization but his precath labs revealed a creatinine of 2.4 which was new. His baseline creatinine is between 1.6 and 1.8. We discontinued his HCTZ and his ACE inhibitor.  He was seen by his medical doctor who also stop his metformin.  We were going to do a cardiac catheterization this winter but we canceled the case because of his renal insufficiency. His metformin was stopped and he made some other medical changes in his creatinine has now improved. Fortunately, he's not having any further episodes of angina.  He informed me that his insurance company is no longer paying for Ranexa.  He's been bradycardic for years. We do not have him on beta blocker for that reason.  He's had some shoulder problems. He needs to have surgery on his right shoulder soon. He will eventually need to have surgery on his left shoulder. He had a low risk  Myoview study in November, 2012. He had an echocardiogram in August, 2013 that was also unchanged. He does not follow a strict diabetic diet.  His glucose levels went very high after his steroid injection ( > 500)  September 27, 2012:  Pt is doing well.  He denies any chest pain. Has some shoulder soreness.  He does have chronic dyspnea and has had worsening dyspnea since he had the flu in January.  He stays away from salt.  Nov. 13, 2014;  He contniues to have  dyspnea an CP.  He did not take a NTG.  He typically gets CP with exertion.   He is short of breath at rest and with exertion.  He seems to minimize his symptoms - wife thinks he has more shortness of breath that he admits to.    i was asked to see him again by Dr. Lorrene Reid for worsening dyspnea.  He avoids  salt.   Echo showed LVEF of 65-70% with LVH, .mild aortic stenosis  Feb. 13, 2015:  Pt is feeling well.  No angina.  Kidney function remained stable. His blood pressure is much better controlled during this visit compared to his last visit 3 months ago. He has been staying with her salt  February 13, 2014: Doing the same  Dec. 2, 2015: No new cardiac issues.  Has had some leg swelling.  Also has some red ulceration in the left lower leg.   They look like bug bites to me.  He has hypothyroidism  and is followed by Dr. Sharlett Iles.  Avoids salt.  Sleeps in a recliner - has lots of leg edema.   December 02, 2014  Pt is doing well.  No CP BP is typically elevated.   Dec. 8, 2016: [name] is seen today for  Evaluation of his coronary artery disease. Is seen in a wheelchair today .    Has a right leg boot on .  Fell and injurred his right leg.  No evidence  of fracture.    Current Outpatient Prescriptions on File Prior to Visit  Medication Sig Dispense Refill  . amiodarone (PACERONE) 200 MG tablet Take 1 tablet (200 mg total) by mouth daily. 60 tablet 5  . amLODipine (NORVASC) 10 MG tablet TAKE ONE TABLET BY MOUTH ONCE DAILY 30 tablet 11  . atorvastatin (LIPITOR) 80 MG tablet TAKE ONE TABLET BY MOUTH ONCE DAILY 90 tablet 3  . cholecalciferol (VITAMIN D) 1000 UNITS tablet Take 1,000 Units by mouth daily at 6 PM.     . doxazosin (CARDURA) 4 MG tablet Take 2 tablets (8 mg total) by mouth daily. 180 tablet 3  . furosemide (LASIX) 80 MG tablet Take 0.5 tablets (40 mg total) by mouth daily as needed for fluid. (Patient taking differently: Take 80 mg by mouth daily. ) 45 tablet 0  . insulin aspart  (NOVOLOG FLEXPEN) 100 UNIT/ML SOPN FlexPen Inject 10 Units into the skin 2 (two) times daily.     . Insulin Glargine (LANTUS SOLOSTAR) 100 UNIT/ML SOPN Inject 32 Units into the skin every morning.     . isosorbide mononitrate (IMDUR) 60 MG 24 hr tablet TAKE ONE TABLET BY MOUTH ONCE DAILY 90 tablet 2  . KLOR-CON M20 20 MEQ tablet Take 20 mEq by mouth daily.    Marland Kitchen levothyroxine (SYNTHROID, LEVOTHROID) 175 MCG tablet Take 175 mcg by mouth daily before breakfast.    . memantine (NAMENDA) 5 MG tablet Take 5 mg by mouth daily.     . Multiple Vitamin (MULTIVITAMIN WITH MINERALS) TABS Take 1 tablet by mouth every evening.     . nitroGLYCERIN (NITROSTAT) 0.4 MG SL tablet Place 1 tablet (0.4 mg total) under the tongue every 5 (five) minutes as needed for chest pain. 25 tablet 3  . polyethylene glycol (MIRALAX / GLYCOLAX) packet Take 17 g by mouth daily as needed for moderate constipation.    . ranitidine (ZANTAC) 300 MG tablet Take 300 mg by mouth 2 (two) times daily.  30 tablet 11  . silver sulfADIAZINE (SILVADENE) 1 % cream Apply 1 application topically daily. 50 g 0  . traMADol (ULTRAM) 50 MG tablet Take by mouth every 6 (six) hours as needed for moderate pain.    Marland Kitchen trolamine salicylate (ASPERCREME) 10 % cream Apply 1 application topically 2 (two) times daily as needed for muscle pain.     Marland Kitchen warfarin (COUMADIN) 5 MG tablet Take 5 mg by mouth daily.      No current facility-administered medications on file prior to visit.    Allergies  Allergen Reactions  . Betadine [Povidone Iodine] Rash  . Iodinated Diagnostic Agents Rash and Other (See Comments)    Does fine with premedications for cath  . Latex Hives    Past Medical History  Diagnosis Date  . Hypertension   . Hyperlipidemia   . Hypothyroidism   . CAD (coronary artery disease)     a. prior CABG in 1997 with redo in 2000. b. last cath in 2008 - managed medically  . Type 2 diabetes mellitus ([place])   . Generalized weakness     without overt  findings  . Peripheral vascular disease ([place])   . Carotid disease, bilateral ([place])     with multiple bilateral carotid surgeries  . Psoriasis   . Meniere's disease   . Degenerative joint disease   . Gout   . Orthopnea     Two-pillow  . CKD (chronic kidney disease), stage IV ([place])     a. Has  fistula in place.   . Chronic diastolic CHF (congestive heart failure) ([place])   . GERD (gastroesophageal reflux disease)   . Atrial fibrillation ([place]) Aug. 2015    a. Dx 01/2014, s/p DCCV 03/06/14.  . Sinus bradycardia     a. Toprol D/C'd due to this.   . Shingles     Past Surgical History  Procedure Laterality Date  . Coronary artery bypass graft  1997 and 2000  . Carotid endarterectomy    . Lung removal, partial    . Laminectomies  July 2001    lumbar laminectomies  . Thoracotomy       left--due to fungal infection  . Shoulder surgery    . Cardiac catheterization    . Appendectomy    . Av fistula placement Left 01/21/2013    Procedure: ARTERIOVENOUS (AV) FISTULA CREATION, Left Brachiocephalic;  Surgeon: Angelia Mould, MD;  Location: [facility];  Service: Vascular;  Laterality: Left;  . Cardiac catheterization  2008    L main irreg, LAD 80%, IMA-LAD & SVG-Diag patent, CFX 100%, SVG-OM 90%, RCA 70%, SVG-RCA OK, EF nl, med rx, no vessels appropriate for PCI  . Tee without cardioversion N/A 03/06/2014    Procedure: TRANSESOPHAGEAL ECHOCARDIOGRAM (TEE);  Surgeon: Larey Dresser, MD;  Location: [facility];  Service: Cardiovascular;  Laterality: N/A;  . Cardioversion N/A 03/06/2014    Procedure: CARDIOVERSION;  Surgeon: Larey Dresser, MD;  Location: [facility];  Service: Cardiovascular;  Laterality: N/A;    History  Smoking status  . Never Smoker   Smokeless tobacco  . Never Used    History  Alcohol Use No    Family History  Problem Relation Age of Onset  . Heart attack Father   . Heart disease Father     After 38 yrs of age  . Hyperlipidemia Father   . Hypertension  Father   . Diabetes Mother   . Hypertension Mother   . Heart disease Mother   . Heart disease Brother   . Diabetes Brother     Reviw of Systems:  Reviewed in the HPI.  All other systems are negative.  Physical Exam: BP 156/60 mmHg  Pulse 53  Ht 5\' 10"  (1.778 m)  Wt 189 lb (85.73 kg)  BMI 27.12 kg/m2 The patient is alert and oriented x 3.  The mood and affect are normal.   Skin: warm and dry.  Color is normal.   HEENT:   Normocephalic/atraumatic. Mucous membranes are normal.  Bilateral carotid bruits,  Left > right.  Lungs: Lungs are clear.  Heart: Regular rate S1-S2. Has a 2/6 systolic ejection murmur cholesterol border.   Abdomen: His abdominal exam is benign. Extremities:  No clubbing cyanosis or edema Neuro:  Exam is nonfocal. Gait is normal.  ECG: Dec. 8, 2016:   Sinus brady at 53.    Assessment / Plan:   1. CAD  - he's not having episodes of angina. Continue current medications.  2. Chronic combined systolic and diastolic CHF - he's not having any severe shortness breath. Continue current medications. He has stage III for chronic kidney disease.  3. Atrial Fib- remains in NSR , continue amiodarone   4. CKD:  5. Essential HTN:   - BP is high here in the office but was 122 / 80 at his medical doctors office this am.   6. Dementia:   Wife states that he has been having progressive memory issues.   Going for evaluation next week .  Livie Vanderhoof, Wonda Cheng, MD  06/03/2015 2:22 PM    [facility] [place],  [place], [place]  [address] Pager [phone] Phone: [phone]; Fax: [phone]   [facility]  [address] [place], [place]  [address] [phone]    Fax [phone]

## 2015-06-03 NOTE — Addendum Note (Signed)
Addended by: Thresa Ross C on: 06/03/2015 01:25 PM   Modules accepted: Orders

## 2015-06-08 ENCOUNTER — Ambulatory Visit
Admission: RE | Admit: 2015-06-08 | Discharge: 2015-06-08 | Disposition: A | Discharge status: Home / Self Care | Payer: Medicare Other | Source: Ambulatory Visit | Attending: Orthopedic Surgery | Admitting: Orthopedic Surgery

## 2015-06-08 DIAGNOSIS — M79671 Pain in right foot: Secondary | ICD-10-CM

## 2015-06-08 DIAGNOSIS — S99921A Unspecified injury of right foot, initial encounter: Secondary | ICD-10-CM | POA: Diagnosis not present

## 2015-06-10 DIAGNOSIS — H35043 Retinal micro-aneurysms, unspecified, bilateral: Secondary | ICD-10-CM | POA: Diagnosis not present

## 2015-06-10 DIAGNOSIS — H35071 Retinal telangiectasis, right eye: Secondary | ICD-10-CM | POA: Diagnosis not present

## 2015-06-10 DIAGNOSIS — E113393 Type 2 diabetes mellitus with moderate nonproliferative diabetic retinopathy without macular edema, bilateral: Secondary | ICD-10-CM | POA: Diagnosis not present

## 2015-06-11 DIAGNOSIS — Z6829 Body mass index (BMI) 29.0-29.9, adult: Secondary | ICD-10-CM | POA: Diagnosis not present

## 2015-06-11 DIAGNOSIS — G309 Alzheimer's disease, unspecified: Secondary | ICD-10-CM | POA: Diagnosis not present

## 2015-06-17 ENCOUNTER — Ambulatory Visit (INDEPENDENT_AMBULATORY_CARE_PROVIDER_SITE_OTHER): Payer: Medicare Other | Admitting: *Deleted

## 2015-06-17 DIAGNOSIS — I4819 Other persistent atrial fibrillation: Secondary | ICD-10-CM

## 2015-06-17 DIAGNOSIS — I481 Persistent atrial fibrillation: Secondary | ICD-10-CM

## 2015-06-17 DIAGNOSIS — I480 Paroxysmal atrial fibrillation: Secondary | ICD-10-CM

## 2015-06-17 DIAGNOSIS — Z5181 Encounter for therapeutic drug level monitoring: Secondary | ICD-10-CM | POA: Diagnosis not present

## 2015-06-17 LAB — POCT INR: INR: 2.6

## 2015-06-30 ENCOUNTER — Encounter: Payer: Self-pay | Admitting: Podiatry

## 2015-06-30 ENCOUNTER — Ambulatory Visit (INDEPENDENT_AMBULATORY_CARE_PROVIDER_SITE_OTHER): Payer: Medicare Other | Admitting: Podiatry

## 2015-06-30 DIAGNOSIS — M79676 Pain in unspecified toe(s): Secondary | ICD-10-CM | POA: Diagnosis not present

## 2015-06-30 DIAGNOSIS — B351 Tinea unguium: Secondary | ICD-10-CM | POA: Diagnosis not present

## 2015-06-30 NOTE — Patient Instructions (Addendum)
Keep your scheduled visit with your orthopedic doctor to follow-up with your foot fracture that you described occurring on 05/10/2015 and a pending follow-up appointment on 07/14/2015  Diabetes and Foot Care Diabetes may cause you to have problems because of poor blood supply (circulation) to your feet and legs. This may cause the skin on your feet to become thinner, break easier, and heal more slowly. Your skin may become dry, and the skin may peel and crack. You may also have nerve damage in your legs and feet causing decreased feeling in them. You may not notice minor injuries to your feet that could lead to infections or more serious problems. Taking care of your feet is one of the most important things you can do for yourself.  HOME CARE INSTRUCTIONS  Wear shoes at all times, even in the house. Do not go barefoot. Bare feet are easily injured.  Check your feet daily for blisters, cuts, and redness. If you cannot see the bottom of your feet, use a mirror or ask someone for help.  Wash your feet with warm water (do not use hot water) and mild soap. Then pat your feet and the areas between your toes until they are completely dry. Do not soak your feet as this can dry your skin.  Apply a moisturizing lotion or petroleum jelly (that does not contain alcohol and is unscented) to the skin on your feet and to dry, brittle toenails. Do not apply lotion between your toes.  Trim your toenails straight across. Do not dig under them or around the cuticle. File the edges of your nails with an emery board or nail file.  Do not cut corns or calluses or try to remove them with medicine.  Wear clean socks or stockings every day. Make sure they are not too tight. Do not wear knee-high stockings since they may decrease blood flow to your legs.  Wear shoes that fit properly and have enough cushioning. To break in new shoes, wear them for just a few hours a day. This prevents you from injuring your feet. Always  look in your shoes before you put them on to be sure there are no objects inside.  Do not cross your legs. This may decrease the blood flow to your feet.  If you find a minor scrape, cut, or break in the skin on your feet, keep it and the skin around it clean and dry. These areas may be cleansed with mild soap and water. Do not cleanse the area with peroxide, alcohol, or iodine.  When you remove an adhesive bandage, be sure not to damage the skin around it.  If you have a wound, look at it several times a day to make sure it is healing.  Do not use heating pads or hot water bottles. They may burn your skin. If you have lost feeling in your feet or legs, you may not know it is happening until it is too late.  Make sure your health care provider performs a complete foot exam at least annually or more often if you have foot problems. Report any cuts, sores, or bruises to your health care provider immediately. SEEK MEDICAL CARE IF:   You have an injury that is not healing.  You have cuts or breaks in the skin.  You have an ingrown nail.  You notice redness on your legs or feet.  You feel burning or tingling in your legs or feet.  You have pain or cramps in  your legs and feet.  Your legs or feet are numb.  Your feet always feel cold. SEEK IMMEDIATE MEDICAL CARE IF:   There is increasing redness, swelling, or pain in or around a wound.  There is a red line that goes up your leg.  Pus is coming from a wound.  You develop a fever or as directed by your health care provider.  You notice a bad smell coming from an ulcer or wound.   This information is not intended to replace advice given to you by your health care provider. Make sure you discuss any questions you have with your health care provider.   Document Released: 06/09/2000 Document Revised: 02/12/2013 Document Reviewed: 11/19/2012 Elsevier Interactive Patient Education [employer].

## 2015-07-01 NOTE — Progress Notes (Signed)
Patient ID: [name], male   DOB: [birth year], 82 y.o.   MRN: [id number]  Subjective: This patient presents for scheduled visit complaining of elongated and thickened toenails which are comfortable walking wearing shoes and requests toenail debridement. Patient also states since his last visit of 03/24/2015 he had fractured his right foot and is currently being treated by orthopedic Dr. and wearing a boot on his right leg and foot and says he has a follow-up examination was ordered orthopedic doctor on 07/04/2015  Objective: Wife present in treatment room edema right lower extremity without any open lesions The toenails are elongated, brittle, hypertrophic, deformed and tender to direct palpation 6-10  Assessment: Symptomatic onychomycoses 6-10 Diabetic with peripheral arterial disease Foot fracture per patient under treatment with orthopedic doctor  Plan: Debridement of toenails 6-10 mechanically and electrically without any bleeding  Reappoint 3 months

## 2015-07-09 ENCOUNTER — Other Ambulatory Visit: Payer: Self-pay | Admitting: Cardiovascular Disease

## 2015-07-14 ENCOUNTER — Ambulatory Visit (HOSPITAL_COMMUNITY)
Admission: RE | Admit: 2015-07-14 | Discharge: 2015-07-14 | Disposition: A | Discharge status: Home / Self Care | Payer: Medicare Other | Source: Ambulatory Visit | Attending: Family | Admitting: Family

## 2015-07-14 ENCOUNTER — Other Ambulatory Visit (HOSPITAL_COMMUNITY): Payer: Self-pay | Admitting: Orthopedic Surgery

## 2015-07-14 DIAGNOSIS — M7989 Other specified soft tissue disorders: Secondary | ICD-10-CM | POA: Diagnosis not present

## 2015-07-14 DIAGNOSIS — M79661 Pain in right lower leg: Secondary | ICD-10-CM

## 2015-07-14 DIAGNOSIS — S8264XD Nondisplaced fracture of lateral malleolus of right fibula, subsequent encounter for closed fracture with routine healing: Secondary | ICD-10-CM | POA: Diagnosis not present

## 2015-07-14 DIAGNOSIS — M79604 Pain in right leg: Secondary | ICD-10-CM | POA: Insufficient documentation

## 2015-07-14 DIAGNOSIS — S93411D Sprain of calcaneofibular ligament of right ankle, subsequent encounter: Secondary | ICD-10-CM | POA: Diagnosis not present

## 2015-07-14 DIAGNOSIS — M79671 Pain in right foot: Secondary | ICD-10-CM | POA: Diagnosis not present

## 2015-07-14 NOTE — Progress Notes (Signed)
VASCULAR LAB PRELIMINARY  PRELIMINARY  PRELIMINARY  PRELIMINARY  Right lower extremity venous Doppler has been completed.     Right:  No evidence of DVT, superficial thrombosis, or Baker's cyst.  Called result to Dr. Rozetta Nunnery, spoke with RN  Janifer Adie, RVT, RDMS 07/14/2015, 4:36 PM

## 2015-07-15 ENCOUNTER — Ambulatory Visit (INDEPENDENT_AMBULATORY_CARE_PROVIDER_SITE_OTHER): Payer: Medicare Other | Admitting: *Deleted

## 2015-07-15 DIAGNOSIS — Z5181 Encounter for therapeutic drug level monitoring: Secondary | ICD-10-CM

## 2015-07-15 DIAGNOSIS — I481 Persistent atrial fibrillation: Secondary | ICD-10-CM | POA: Diagnosis not present

## 2015-07-15 DIAGNOSIS — I4819 Other persistent atrial fibrillation: Secondary | ICD-10-CM

## 2015-07-15 DIAGNOSIS — I480 Paroxysmal atrial fibrillation: Secondary | ICD-10-CM | POA: Diagnosis not present

## 2015-07-15 LAB — POCT INR: INR: 4.4

## 2015-07-28 ENCOUNTER — Ambulatory Visit (INDEPENDENT_AMBULATORY_CARE_PROVIDER_SITE_OTHER): Payer: Medicare Other | Admitting: Pharmacist

## 2015-07-28 DIAGNOSIS — I480 Paroxysmal atrial fibrillation: Secondary | ICD-10-CM

## 2015-07-28 DIAGNOSIS — I481 Persistent atrial fibrillation: Secondary | ICD-10-CM

## 2015-07-28 DIAGNOSIS — Z5181 Encounter for therapeutic drug level monitoring: Secondary | ICD-10-CM | POA: Diagnosis not present

## 2015-07-28 DIAGNOSIS — I4819 Other persistent atrial fibrillation: Secondary | ICD-10-CM

## 2015-07-28 LAB — POCT INR: INR: 3.7

## 2015-07-29 DIAGNOSIS — E1151 Type 2 diabetes mellitus with diabetic peripheral angiopathy without gangrene: Secondary | ICD-10-CM | POA: Diagnosis not present

## 2015-07-29 DIAGNOSIS — N184 Chronic kidney disease, stage 4 (severe): Secondary | ICD-10-CM | POA: Diagnosis not present

## 2015-07-29 DIAGNOSIS — E038 Other specified hypothyroidism: Secondary | ICD-10-CM | POA: Diagnosis not present

## 2015-07-29 DIAGNOSIS — G308 Other Alzheimer's disease: Secondary | ICD-10-CM | POA: Diagnosis not present

## 2015-07-29 DIAGNOSIS — Z6829 Body mass index (BMI) 29.0-29.9, adult: Secondary | ICD-10-CM | POA: Diagnosis not present

## 2015-07-29 DIAGNOSIS — E784 Other hyperlipidemia: Secondary | ICD-10-CM | POA: Diagnosis not present

## 2015-07-29 DIAGNOSIS — I2510 Atherosclerotic heart disease of native coronary artery without angina pectoris: Secondary | ICD-10-CM | POA: Diagnosis not present

## 2015-08-02 DIAGNOSIS — L570 Actinic keratosis: Secondary | ICD-10-CM | POA: Diagnosis not present

## 2015-08-05 DIAGNOSIS — S93411D Sprain of calcaneofibular ligament of right ankle, subsequent encounter: Secondary | ICD-10-CM | POA: Diagnosis not present

## 2015-08-05 DIAGNOSIS — S8264XD Nondisplaced fracture of lateral malleolus of right fibula, subsequent encounter for closed fracture with routine healing: Secondary | ICD-10-CM | POA: Diagnosis not present

## 2015-08-05 DIAGNOSIS — M79671 Pain in right foot: Secondary | ICD-10-CM | POA: Diagnosis not present

## 2015-08-06 DIAGNOSIS — E039 Hypothyroidism, unspecified: Secondary | ICD-10-CM | POA: Diagnosis not present

## 2015-08-06 DIAGNOSIS — R413 Other amnesia: Secondary | ICD-10-CM | POA: Diagnosis not present

## 2015-08-06 DIAGNOSIS — I129 Hypertensive chronic kidney disease with stage 1 through stage 4 chronic kidney disease, or unspecified chronic kidney disease: Secondary | ICD-10-CM | POA: Diagnosis not present

## 2015-08-06 DIAGNOSIS — I4891 Unspecified atrial fibrillation: Secondary | ICD-10-CM | POA: Diagnosis not present

## 2015-08-06 DIAGNOSIS — I770 Arteriovenous fistula, acquired: Secondary | ICD-10-CM | POA: Diagnosis not present

## 2015-08-06 DIAGNOSIS — D649 Anemia, unspecified: Secondary | ICD-10-CM | POA: Diagnosis not present

## 2015-08-06 DIAGNOSIS — E119 Type 2 diabetes mellitus without complications: Secondary | ICD-10-CM | POA: Diagnosis not present

## 2015-08-06 DIAGNOSIS — M109 Gout, unspecified: Secondary | ICD-10-CM | POA: Diagnosis not present

## 2015-08-06 DIAGNOSIS — N184 Chronic kidney disease, stage 4 (severe): Secondary | ICD-10-CM | POA: Diagnosis not present

## 2015-08-06 DIAGNOSIS — Z794 Long term (current) use of insulin: Secondary | ICD-10-CM | POA: Diagnosis not present

## 2015-08-12 ENCOUNTER — Ambulatory Visit (INDEPENDENT_AMBULATORY_CARE_PROVIDER_SITE_OTHER): Payer: Medicare Other | Admitting: *Deleted

## 2015-08-12 DIAGNOSIS — Z5181 Encounter for therapeutic drug level monitoring: Secondary | ICD-10-CM

## 2015-08-12 DIAGNOSIS — I4819 Other persistent atrial fibrillation: Secondary | ICD-10-CM

## 2015-08-12 DIAGNOSIS — I480 Paroxysmal atrial fibrillation: Secondary | ICD-10-CM | POA: Diagnosis not present

## 2015-08-12 DIAGNOSIS — I481 Persistent atrial fibrillation: Secondary | ICD-10-CM

## 2015-08-12 LAB — POCT INR: INR: 3.9

## 2015-08-16 ENCOUNTER — Encounter: Payer: Self-pay | Admitting: Cardiovascular Disease

## 2015-08-18 DIAGNOSIS — N184 Chronic kidney disease, stage 4 (severe): Secondary | ICD-10-CM | POA: Diagnosis not present

## 2015-08-21 DIAGNOSIS — M549 Dorsalgia, unspecified: Secondary | ICD-10-CM | POA: Diagnosis not present

## 2015-08-21 DIAGNOSIS — T148 Other injury of unspecified body region: Secondary | ICD-10-CM | POA: Diagnosis not present

## 2015-08-26 ENCOUNTER — Ambulatory Visit (INDEPENDENT_AMBULATORY_CARE_PROVIDER_SITE_OTHER): Payer: Medicare Other | Admitting: *Deleted

## 2015-08-26 DIAGNOSIS — I480 Paroxysmal atrial fibrillation: Secondary | ICD-10-CM | POA: Diagnosis not present

## 2015-08-26 DIAGNOSIS — I481 Persistent atrial fibrillation: Secondary | ICD-10-CM | POA: Diagnosis not present

## 2015-08-26 DIAGNOSIS — Z5181 Encounter for therapeutic drug level monitoring: Secondary | ICD-10-CM

## 2015-08-26 DIAGNOSIS — I4819 Other persistent atrial fibrillation: Secondary | ICD-10-CM

## 2015-08-26 LAB — POCT INR: INR: 3.2

## 2015-09-06 DIAGNOSIS — R05 Cough: Secondary | ICD-10-CM | POA: Diagnosis not present

## 2015-09-06 DIAGNOSIS — I480 Paroxysmal atrial fibrillation: Secondary | ICD-10-CM | POA: Diagnosis not present

## 2015-09-06 DIAGNOSIS — E1129 Type 2 diabetes mellitus with other diabetic kidney complication: Secondary | ICD-10-CM | POA: Diagnosis not present

## 2015-09-06 DIAGNOSIS — E038 Other specified hypothyroidism: Secondary | ICD-10-CM | POA: Diagnosis not present

## 2015-09-06 DIAGNOSIS — I10 Essential (primary) hypertension: Secondary | ICD-10-CM | POA: Diagnosis not present

## 2015-09-06 DIAGNOSIS — R0609 Other forms of dyspnea: Secondary | ICD-10-CM | POA: Diagnosis not present

## 2015-09-06 DIAGNOSIS — Z6830 Body mass index (BMI) 30.0-30.9, adult: Secondary | ICD-10-CM | POA: Diagnosis not present

## 2015-09-06 DIAGNOSIS — R0602 Shortness of breath: Secondary | ICD-10-CM | POA: Diagnosis not present

## 2015-09-06 DIAGNOSIS — I517 Cardiomegaly: Secondary | ICD-10-CM | POA: Diagnosis not present

## 2015-09-06 DIAGNOSIS — N185 Chronic kidney disease, stage 5: Secondary | ICD-10-CM | POA: Diagnosis not present

## 2015-09-08 ENCOUNTER — Telehealth: Payer: Self-pay | Admitting: *Deleted

## 2015-09-08 ENCOUNTER — Telehealth: Payer: Self-pay | Admitting: Cardiovascular Disease

## 2015-09-08 NOTE — Telephone Encounter (Signed)
New Message:   Please call,pt is retaining fluid,coughing a lot,rattling and this is not cold related. His kidney doctor told her to call his Cardiologist.

## 2015-09-08 NOTE — Telephone Encounter (Signed)
Spoke with patient's wife. Reports patient saw PCP on Monday due to cough, had CXR and labs Was instructed to increase lasix to 120 mg BID and contact cardiology.  Weights: 3/11 203.0 3/14 206.0 3/15 206.4 (he was 189 lb at OV in December)  He is "always SOB" Appetite poor, eating only breakfast daily Has home O2 for HS, also wearing PRN during day currently.  Reviewed with Lattie Haw, added patient to Dr. Thompson Caul DOD schedule tomorrow.

## 2015-09-08 NOTE — Telephone Encounter (Signed)
Called patient's wife back and offered appointment.  She will bring him for 11:30 am. He is also due for coumadin clinic tomorrow as well.   Adv that if he seems worse in anyway with his symptoms he should go to ER for evaluation.  She verbalizes understanding and agreement. Message sent to chart prep to request labs, cxr from PCP

## 2015-09-09 ENCOUNTER — Encounter (INDEPENDENT_AMBULATORY_CARE_PROVIDER_SITE_OTHER): Payer: Medicare Other

## 2015-09-09 ENCOUNTER — Inpatient Hospital Stay (HOSPITAL_COMMUNITY)
Admission: EM | Admit: 2015-09-09 | Discharge: 2015-09-29 | DRG: 291 | Disposition: A | Discharge status: Skilled Nursing Facility | Payer: Medicare Other | Attending: Interventional Cardiology | Admitting: Interventional Cardiology

## 2015-09-09 ENCOUNTER — Encounter: Payer: Self-pay | Admitting: Interventional Cardiology

## 2015-09-09 ENCOUNTER — Ambulatory Visit (INDEPENDENT_AMBULATORY_CARE_PROVIDER_SITE_OTHER): Payer: Medicare Other | Admitting: Interventional Cardiology

## 2015-09-09 ENCOUNTER — Emergency Department (HOSPITAL_COMMUNITY): Payer: Medicare Other

## 2015-09-09 ENCOUNTER — Encounter (HOSPITAL_COMMUNITY): Payer: Self-pay

## 2015-09-09 ENCOUNTER — Other Ambulatory Visit: Payer: Self-pay | Admitting: Interventional Cardiology

## 2015-09-09 VITALS — BP 144/56 | HR 54 | Ht 70.0 in | Wt 211.8 lb

## 2015-09-09 DIAGNOSIS — I5042 Chronic combined systolic (congestive) and diastolic (congestive) heart failure: Secondary | ICD-10-CM | POA: Diagnosis not present

## 2015-09-09 DIAGNOSIS — N390 Urinary tract infection, site not specified: Secondary | ICD-10-CM | POA: Diagnosis present

## 2015-09-09 DIAGNOSIS — I132 Hypertensive heart and chronic kidney disease with heart failure and with stage 5 chronic kidney disease, or end stage renal disease: Secondary | ICD-10-CM | POA: Diagnosis not present

## 2015-09-09 DIAGNOSIS — Z8249 Family history of ischemic heart disease and other diseases of the circulatory system: Secondary | ICD-10-CM

## 2015-09-09 DIAGNOSIS — I481 Persistent atrial fibrillation: Secondary | ICD-10-CM | POA: Diagnosis present

## 2015-09-09 DIAGNOSIS — E1151 Type 2 diabetes mellitus with diabetic peripheral angiopathy without gangrene: Secondary | ICD-10-CM | POA: Diagnosis present

## 2015-09-09 DIAGNOSIS — L409 Psoriasis, unspecified: Secondary | ICD-10-CM | POA: Diagnosis present

## 2015-09-09 DIAGNOSIS — J90 Pleural effusion, not elsewhere classified: Secondary | ICD-10-CM | POA: Diagnosis not present

## 2015-09-09 DIAGNOSIS — E1122 Type 2 diabetes mellitus with diabetic chronic kidney disease: Secondary | ICD-10-CM | POA: Diagnosis present

## 2015-09-09 DIAGNOSIS — B961 Klebsiella pneumoniae [K. pneumoniae] as the cause of diseases classified elsewhere: Secondary | ICD-10-CM | POA: Diagnosis present

## 2015-09-09 DIAGNOSIS — M6281 Muscle weakness (generalized): Secondary | ICD-10-CM | POA: Diagnosis not present

## 2015-09-09 DIAGNOSIS — R001 Bradycardia, unspecified: Secondary | ICD-10-CM | POA: Diagnosis present

## 2015-09-09 DIAGNOSIS — R2681 Unsteadiness on feet: Secondary | ICD-10-CM | POA: Diagnosis not present

## 2015-09-09 DIAGNOSIS — R05 Cough: Secondary | ICD-10-CM | POA: Diagnosis not present

## 2015-09-09 DIAGNOSIS — N184 Chronic kidney disease, stage 4 (severe): Secondary | ICD-10-CM | POA: Diagnosis not present

## 2015-09-09 DIAGNOSIS — I5023 Acute on chronic systolic (congestive) heart failure: Secondary | ICD-10-CM

## 2015-09-09 DIAGNOSIS — I509 Heart failure, unspecified: Secondary | ICD-10-CM | POA: Diagnosis not present

## 2015-09-09 DIAGNOSIS — Z7901 Long term (current) use of anticoagulants: Secondary | ICD-10-CM | POA: Diagnosis not present

## 2015-09-09 DIAGNOSIS — J9621 Acute and chronic respiratory failure with hypoxia: Secondary | ICD-10-CM | POA: Diagnosis not present

## 2015-09-09 DIAGNOSIS — R0902 Hypoxemia: Secondary | ICD-10-CM

## 2015-09-09 DIAGNOSIS — I10 Essential (primary) hypertension: Secondary | ICD-10-CM | POA: Diagnosis not present

## 2015-09-09 DIAGNOSIS — Z6830 Body mass index (BMI) 30.0-30.9, adult: Secondary | ICD-10-CM | POA: Diagnosis not present

## 2015-09-09 DIAGNOSIS — R1310 Dysphagia, unspecified: Secondary | ICD-10-CM | POA: Diagnosis not present

## 2015-09-09 DIAGNOSIS — Z951 Presence of aortocoronary bypass graft: Secondary | ICD-10-CM

## 2015-09-09 DIAGNOSIS — I5043 Acute on chronic combined systolic (congestive) and diastolic (congestive) heart failure: Secondary | ICD-10-CM | POA: Diagnosis not present

## 2015-09-09 DIAGNOSIS — I080 Rheumatic disorders of both mitral and aortic valves: Secondary | ICD-10-CM | POA: Diagnosis present

## 2015-09-09 DIAGNOSIS — Z5181 Encounter for therapeutic drug level monitoring: Secondary | ICD-10-CM

## 2015-09-09 DIAGNOSIS — Z7189 Other specified counseling: Secondary | ICD-10-CM | POA: Insufficient documentation

## 2015-09-09 DIAGNOSIS — I110 Hypertensive heart disease with heart failure: Secondary | ICD-10-CM | POA: Diagnosis present

## 2015-09-09 DIAGNOSIS — E785 Hyperlipidemia, unspecified: Secondary | ICD-10-CM | POA: Diagnosis not present

## 2015-09-09 DIAGNOSIS — Z992 Dependence on renal dialysis: Secondary | ICD-10-CM | POA: Insufficient documentation

## 2015-09-09 DIAGNOSIS — N186 End stage renal disease: Secondary | ICD-10-CM | POA: Insufficient documentation

## 2015-09-09 DIAGNOSIS — E119 Type 2 diabetes mellitus without complications: Secondary | ICD-10-CM | POA: Diagnosis not present

## 2015-09-09 DIAGNOSIS — R0600 Dyspnea, unspecified: Secondary | ICD-10-CM

## 2015-09-09 DIAGNOSIS — I480 Paroxysmal atrial fibrillation: Secondary | ICD-10-CM | POA: Diagnosis present

## 2015-09-09 DIAGNOSIS — Z79899 Other long term (current) drug therapy: Secondary | ICD-10-CM

## 2015-09-09 DIAGNOSIS — Z794 Long term (current) use of insulin: Secondary | ICD-10-CM | POA: Diagnosis not present

## 2015-09-09 DIAGNOSIS — N179 Acute kidney failure, unspecified: Secondary | ICD-10-CM | POA: Diagnosis not present

## 2015-09-09 DIAGNOSIS — R488 Other symbolic dysfunctions: Secondary | ICD-10-CM | POA: Diagnosis not present

## 2015-09-09 DIAGNOSIS — I2510 Atherosclerotic heart disease of native coronary artery without angina pectoris: Secondary | ICD-10-CM | POA: Diagnosis present

## 2015-09-09 DIAGNOSIS — E039 Hypothyroidism, unspecified: Secondary | ICD-10-CM | POA: Diagnosis present

## 2015-09-09 DIAGNOSIS — Z66 Do not resuscitate: Secondary | ICD-10-CM | POA: Diagnosis not present

## 2015-09-09 DIAGNOSIS — M1990 Unspecified osteoarthritis, unspecified site: Secondary | ICD-10-CM | POA: Diagnosis present

## 2015-09-09 DIAGNOSIS — I482 Chronic atrial fibrillation: Secondary | ICD-10-CM | POA: Diagnosis not present

## 2015-09-09 DIAGNOSIS — R630 Anorexia: Secondary | ICD-10-CM | POA: Diagnosis present

## 2015-09-09 DIAGNOSIS — K761 Chronic passive congestion of liver: Secondary | ICD-10-CM | POA: Diagnosis present

## 2015-09-09 DIAGNOSIS — D631 Anemia in chronic kidney disease: Secondary | ICD-10-CM | POA: Diagnosis present

## 2015-09-09 DIAGNOSIS — B9620 Unspecified Escherichia coli [E. coli] as the cause of diseases classified elsewhere: Secondary | ICD-10-CM | POA: Diagnosis present

## 2015-09-09 DIAGNOSIS — N185 Chronic kidney disease, stage 5: Secondary | ICD-10-CM | POA: Diagnosis not present

## 2015-09-09 DIAGNOSIS — E669 Obesity, unspecified: Secondary | ICD-10-CM | POA: Diagnosis present

## 2015-09-09 DIAGNOSIS — I350 Nonrheumatic aortic (valve) stenosis: Secondary | ICD-10-CM | POA: Diagnosis not present

## 2015-09-09 DIAGNOSIS — I5033 Acute on chronic diastolic (congestive) heart failure: Secondary | ICD-10-CM | POA: Diagnosis not present

## 2015-09-09 DIAGNOSIS — E871 Hypo-osmolality and hyponatremia: Secondary | ICD-10-CM | POA: Diagnosis present

## 2015-09-09 DIAGNOSIS — R1312 Dysphagia, oropharyngeal phase: Secondary | ICD-10-CM | POA: Diagnosis not present

## 2015-09-09 DIAGNOSIS — K219 Gastro-esophageal reflux disease without esophagitis: Secondary | ICD-10-CM | POA: Diagnosis present

## 2015-09-09 DIAGNOSIS — F0280 Dementia in other diseases classified elsewhere without behavioral disturbance: Secondary | ICD-10-CM | POA: Diagnosis present

## 2015-09-09 DIAGNOSIS — Z515 Encounter for palliative care: Secondary | ICD-10-CM | POA: Diagnosis not present

## 2015-09-09 DIAGNOSIS — R0602 Shortness of breath: Secondary | ICD-10-CM | POA: Diagnosis not present

## 2015-09-09 DIAGNOSIS — Z5189 Encounter for other specified aftercare: Secondary | ICD-10-CM | POA: Diagnosis not present

## 2015-09-09 DIAGNOSIS — I4891 Unspecified atrial fibrillation: Secondary | ICD-10-CM | POA: Diagnosis present

## 2015-09-09 DIAGNOSIS — I4819 Other persistent atrial fibrillation: Secondary | ICD-10-CM

## 2015-09-09 HISTORY — DX: Unspecified dementia, unspecified severity, without behavioral disturbance, psychotic disturbance, mood disturbance, and anxiety: F03.90

## 2015-09-09 HISTORY — DX: Dependence on supplemental oxygen: Z99.81

## 2015-09-09 LAB — COMPREHENSIVE METABOLIC PANEL
ALK PHOS: 74 U/L (ref 38–126)
ALT: 63 U/L (ref 17–63)
ALT: 67 U/L — AB (ref 17–63)
ANION GAP: 15 (ref 5–15)
AST: 63 U/L — ABNORMAL HIGH (ref 15–41)
AST: 60 U/L — AB (ref 15–41)
Albumin: 3.4 g/dL — ABNORMAL LOW (ref 3.5–5.0)
Albumin: 3.6 g/dL (ref 3.5–5.0)
Alkaline Phosphatase: 75 U/L (ref 38–126)
Anion gap: 14 (ref 5–15)
BILIRUBIN TOTAL: 0.7 mg/dL (ref 0.3–1.2)
BUN: 84 mg/dL — ABNORMAL HIGH (ref 6–20)
BUN: 83 mg/dL — AB (ref 6–20)
CALCIUM: 8.7 mg/dL — AB (ref 8.9–10.3)
CHLORIDE: 99 mmol/L — AB (ref 101–111)
CO2: 21 mmol/L — ABNORMAL LOW (ref 22–32)
CO2: 22 mmol/L (ref 22–32)
CREATININE: 3.27 mg/dL — AB (ref 0.61–1.24)
Calcium: 8.6 mg/dL — ABNORMAL LOW (ref 8.9–10.3)
Chloride: 100 mmol/L — ABNORMAL LOW (ref 101–111)
Creatinine, Ser: 3.34 mg/dL — ABNORMAL HIGH (ref 0.61–1.24)
GFR calc Af Amer: 19 mL/min — ABNORMAL LOW (ref >60)
GFR, EST AFRICAN AMERICAN: 19 mL/min — AB (ref >60)
GFR, EST NON AFRICAN AMERICAN: 16 mL/min — AB (ref >60)
GFR, EST NON AFRICAN AMERICAN: 17 mL/min — AB (ref >60)
GLUCOSE: 125 mg/dL — AB (ref 65–99)
GLUCOSE: 121 mg/dL — AB (ref 65–99)
POTASSIUM: 4.5 mmol/L (ref 3.5–5.1)
Potassium: 4.3 mmol/L (ref 3.5–5.1)
Sodium: 136 mmol/L (ref 135–145)
Sodium: 135 mmol/L (ref 135–145)
TOTAL PROTEIN: 6 g/dL — AB (ref 6.5–8.1)
Total Bilirubin: 0.7 mg/dL (ref 0.3–1.2)
Total Protein: 6.3 g/dL — ABNORMAL LOW (ref 6.5–8.1)

## 2015-09-09 LAB — TROPONIN I
Troponin I: 0.03 ng/mL (ref <0.031)
Troponin I: 0.03 ng/mL (ref <0.031)

## 2015-09-09 LAB — CBC
HCT: 32.6 % — ABNORMAL LOW (ref 39.0–52.0)
HEMOGLOBIN: 10.3 g/dL — AB (ref 13.0–17.0)
MCH: 28.5 pg (ref 26.0–34.0)
MCHC: 31.6 g/dL (ref 30.0–36.0)
MCV: 90.1 fL (ref 78.0–100.0)
Platelets: 109 10*3/uL — ABNORMAL LOW (ref 150–400)
RBC: 3.62 MIL/uL — ABNORMAL LOW (ref 4.22–5.81)
RDW: 15.9 % — AB (ref 11.5–15.5)
WBC: 5.9 10*3/uL (ref 4.0–10.5)

## 2015-09-09 LAB — CBC WITH DIFFERENTIAL/PLATELET
BASOS ABS: 0 10*3/uL (ref 0.0–0.1)
Basophils Relative: 0 %
EOS ABS: 0 10*3/uL (ref 0.0–0.7)
Eosinophils Relative: 0 %
HCT: 34.6 % — ABNORMAL LOW (ref 39.0–52.0)
Hemoglobin: 10.8 g/dL — ABNORMAL LOW (ref 13.0–17.0)
Lymphocytes Relative: 5 %
Lymphs Abs: 0.3 10*3/uL — ABNORMAL LOW (ref 0.7–4.0)
MCH: 27.8 pg (ref 26.0–34.0)
MCHC: 31.2 g/dL (ref 30.0–36.0)
MCV: 89.2 fL (ref 78.0–100.0)
Monocytes Absolute: 0.3 10*3/uL (ref 0.1–1.0)
Monocytes Relative: 6 %
NEUTROS ABS: 5.5 10*3/uL (ref 1.7–7.7)
NEUTROS PCT: 89 %
PLATELETS: 120 10*3/uL — AB (ref 150–400)
RBC: 3.88 MIL/uL — AB (ref 4.22–5.81)
RDW: 15.9 % — ABNORMAL HIGH (ref 11.5–15.5)
WBC: 6.1 10*3/uL (ref 4.0–10.5)

## 2015-09-09 LAB — BRAIN NATRIURETIC PEPTIDE
B NATRIURETIC PEPTIDE 5: 605.6 pg/mL — AB (ref 0.0–100.0)
B NATRIURETIC PEPTIDE 5: 729.6 pg/mL — AB (ref 0.0–100.0)

## 2015-09-09 LAB — APTT: APTT: 54 s — AB (ref 24–37)

## 2015-09-09 LAB — I-STAT TROPONIN, ED: TROPONIN I, POC: 0.05 ng/mL (ref 0.00–0.08)

## 2015-09-09 LAB — GLUCOSE, CAPILLARY
GLUCOSE-CAPILLARY: 105 mg/dL — AB (ref 65–99)
GLUCOSE-CAPILLARY: 141 mg/dL — AB (ref 65–99)

## 2015-09-09 LAB — PROTIME-INR
INR: 3.93 — AB (ref 0.00–1.49)
PROTHROMBIN TIME: 37.5 s — AB (ref 11.6–15.2)

## 2015-09-09 LAB — TSH: TSH: 1.545 u[IU]/mL (ref 0.350–4.500)

## 2015-09-09 LAB — MAGNESIUM: MAGNESIUM: 2.5 mg/dL — AB (ref 1.7–2.4)

## 2015-09-09 MED ORDER — SODIUM CHLORIDE 0.9 % IV SOLN: 250.0000 mL | INTRAVENOUS | Status: DC | PRN

## 2015-09-09 MED ORDER — ATORVASTATIN CALCIUM 80 MG PO TABS
80.0000 mg | ORAL_TABLET | Freq: Every day | ORAL | Status: DC
  Administered 2015-09-10 – 2015-09-18 (×9): 80 mg via ORAL
  Filled 2015-09-09 (×10): qty 1

## 2015-09-09 MED ORDER — CYANOCOBALAMIN 1000 MCG/ML IJ SOLN
1000.0000 ug | Freq: Once | INTRAMUSCULAR | Status: DC
  Filled 2015-09-09: qty 1

## 2015-09-09 MED ORDER — POTASSIUM CHLORIDE CRYS ER 20 MEQ PO TBCR
20.0000 meq | EXTENDED_RELEASE_TABLET | Freq: Every day | ORAL | Status: DC
  Administered 2015-09-10 – 2015-09-13 (×4): 20 meq via ORAL
  Filled 2015-09-09 (×4): qty 1

## 2015-09-09 MED ORDER — INSULIN ASPART 100 UNIT/ML ~~LOC~~ SOLN
0.0000 [IU] | Freq: Three times a day (TID) | SUBCUTANEOUS | Status: DC
  Administered 2015-09-10: 11 [IU] via SUBCUTANEOUS
  Administered 2015-09-11: 5 [IU] via SUBCUTANEOUS
  Administered 2015-09-11 – 2015-09-12 (×3): 3 [IU] via SUBCUTANEOUS
  Administered 2015-09-12: 2 [IU] via SUBCUTANEOUS
  Administered 2015-09-13 (×2): 5 [IU] via SUBCUTANEOUS
  Administered 2015-09-14 (×2): 3 [IU] via SUBCUTANEOUS
  Administered 2015-09-14: 2 [IU] via SUBCUTANEOUS
  Administered 2015-09-15 (×2): 3 [IU] via SUBCUTANEOUS
  Administered 2015-09-15: 2 [IU] via SUBCUTANEOUS
  Administered 2015-09-16 (×3): 3 [IU] via SUBCUTANEOUS
  Administered 2015-09-17: 5 [IU] via SUBCUTANEOUS
  Administered 2015-09-17: 3 [IU] via SUBCUTANEOUS
  Administered 2015-09-17: 5 [IU] via SUBCUTANEOUS
  Administered 2015-09-18: 3 [IU] via SUBCUTANEOUS
  Administered 2015-09-18: 5 [IU] via SUBCUTANEOUS
  Administered 2015-09-19: 3 [IU] via SUBCUTANEOUS
  Administered 2015-09-19: 2 [IU] via SUBCUTANEOUS
  Administered 2015-09-20: 3 [IU] via SUBCUTANEOUS
  Administered 2015-09-21 (×2): 2 [IU] via SUBCUTANEOUS
  Administered 2015-09-21 – 2015-09-22 (×2): 3 [IU] via SUBCUTANEOUS
  Administered 2015-09-23 (×2): 2 [IU] via SUBCUTANEOUS
  Administered 2015-09-23: 3 [IU] via SUBCUTANEOUS
  Administered 2015-09-24: 8 [IU] via SUBCUTANEOUS
  Administered 2015-09-24: 2 [IU] via SUBCUTANEOUS
  Administered 2015-09-25 – 2015-09-26 (×4): 3 [IU] via SUBCUTANEOUS
  Administered 2015-09-26: 8 [IU] via SUBCUTANEOUS
  Administered 2015-09-26 – 2015-09-27 (×2): 3 [IU] via SUBCUTANEOUS
  Administered 2015-09-28: 2 [IU] via SUBCUTANEOUS
  Administered 2015-09-28: 3 [IU] via SUBCUTANEOUS
  Administered 2015-09-28: 8 [IU] via SUBCUTANEOUS
  Administered 2015-09-29: 3 [IU] via SUBCUTANEOUS
  Administered 2015-09-29: 5 [IU] via SUBCUTANEOUS

## 2015-09-09 MED ORDER — FUROSEMIDE 10 MG/ML IJ SOLN: 80.0000 mg | Freq: Four times a day (QID) | INTRAMUSCULAR | Status: DC

## 2015-09-09 MED ORDER — INSULIN GLARGINE 100 UNIT/ML ~~LOC~~ SOLN
15.0000 [IU] | Freq: Every day | SUBCUTANEOUS | Status: DC
  Administered 2015-09-10 – 2015-09-29 (×17): 15 [IU] via SUBCUTANEOUS
  Filled 2015-09-09 (×20): qty 0.15

## 2015-09-09 MED ORDER — HEPARIN SODIUM (PORCINE) 5000 UNIT/ML IJ SOLN
5000.0000 [IU] | Freq: Three times a day (TID) | INTRAMUSCULAR | Status: DC
  Administered 2015-09-09 – 2015-09-10 (×2): 5000 [IU] via SUBCUTANEOUS
  Filled 2015-09-09 (×2): qty 1

## 2015-09-09 MED ORDER — INSULIN GLARGINE 100 UNIT/ML ~~LOC~~ SOLN: 15.0000 [IU] | Freq: Every day | SUBCUTANEOUS | Status: DC

## 2015-09-09 MED ORDER — FUROSEMIDE 10 MG/ML IJ SOLN
120.0000 mg | Freq: Two times a day (BID) | INTRAMUSCULAR | Status: DC
  Administered 2015-09-09 – 2015-09-13 (×8): 120 mg via INTRAVENOUS
  Filled 2015-09-09 (×9): qty 12

## 2015-09-09 MED ORDER — SODIUM CHLORIDE 0.9% FLUSH
3.0000 mL | INTRAVENOUS | Status: DC | PRN
  Administered 2015-09-23: 3 mL via INTRAVENOUS
  Filled 2015-09-09: qty 3

## 2015-09-09 MED ORDER — INSULIN ASPART 100 UNIT/ML ~~LOC~~ SOLN
0.0000 [IU] | Freq: Every day | SUBCUTANEOUS | Status: DC
  Administered 2015-09-25 – 2015-09-28 (×3): 2 [IU] via SUBCUTANEOUS

## 2015-09-09 MED ORDER — DOXAZOSIN MESYLATE 8 MG PO TABS
8.0000 mg | ORAL_TABLET | Freq: Every day | ORAL | Status: DC
  Administered 2015-09-09 – 2015-09-18 (×10): 8 mg via ORAL
  Filled 2015-09-09 (×14): qty 1

## 2015-09-09 MED ORDER — ISOSORBIDE MONONITRATE ER 60 MG PO TB24: 60.0000 mg | ORAL_TABLET | Freq: Every day | ORAL | Status: DC

## 2015-09-09 MED ORDER — PANTOPRAZOLE SODIUM 40 MG PO TBEC
40.0000 mg | DELAYED_RELEASE_TABLET | Freq: Every day | ORAL | Status: DC
  Administered 2015-09-10 – 2015-09-29 (×18): 40 mg via ORAL
  Filled 2015-09-09 (×18): qty 1

## 2015-09-09 MED ORDER — FUROSEMIDE 10 MG/ML IJ SOLN
120.0000 mg | Freq: Two times a day (BID) | INTRAVENOUS | Status: DC
  Filled 2015-09-09: qty 12

## 2015-09-09 MED ORDER — MEMANTINE HCL 10 MG PO TABS
5.0000 mg | ORAL_TABLET | Freq: Every day | ORAL | Status: DC
  Administered 2015-09-09 – 2015-09-29 (×18): 5 mg via ORAL
  Filled 2015-09-09 (×23): qty 1

## 2015-09-09 MED ORDER — SODIUM CHLORIDE 0.9% FLUSH
3.0000 mL | Freq: Two times a day (BID) | INTRAVENOUS | Status: DC
  Administered 2015-09-10 – 2015-09-29 (×26): 3 mL via INTRAVENOUS

## 2015-09-09 MED ORDER — ACETAMINOPHEN 325 MG PO TABS
650.0000 mg | ORAL_TABLET | ORAL | Status: DC | PRN
  Filled 2015-09-09: qty 2

## 2015-09-09 MED ORDER — ISOSORBIDE MONONITRATE ER 60 MG PO TB24
60.0000 mg | ORAL_TABLET | Freq: Every day | ORAL | Status: DC
  Administered 2015-09-10 – 2015-09-21 (×10): 60 mg via ORAL
  Filled 2015-09-09 (×14): qty 1

## 2015-09-09 MED ORDER — ONDANSETRON HCL 4 MG/2ML IJ SOLN
4.0000 mg | Freq: Four times a day (QID) | INTRAMUSCULAR | Status: DC | PRN
  Administered 2015-09-25: 4 mg via INTRAVENOUS
  Filled 2015-09-09: qty 2

## 2015-09-09 NOTE — ED Notes (Signed)
Patient here with increasing shortness of breath and pedal edema, increased lasix with minimal relief. Here from cardiologist for diuresis

## 2015-09-09 NOTE — H&P (Signed)
Cardiology Office Note   Date:  09/09/2015   ID:  [name], DOB [birth year], MRN [id number]  PCP:  Donnajean Lopes, MD  Cardiologist:  Sinclair Grooms, MD   Chief Complaint  Patient presents with  . Shortness of Breath      History of Present Illness: [name] is a 82 y.o. male who presents to office DOD with complaint of 2-3 week history of lower extremity swelling, cough, dyspnea, and recent visit with primary physician, Dr. Philip [name] who informed the patient and his daughter that he was in heart failure and arrange an outpatient follow-up today.  In discussion with the patient's daughter, he has developed progressive lower extremity edema right leg greater than left. Right lower extremity edema has always been more of an issue than left lower extremity edema. He denies chest pain. For year and half he has slept in a recliner after his last hospitalization for heart failure. There concern now his cough, progressive dyspnea, and the concern about the recent x-ray performed by Dr. Philip [name] that suggested congestive heart failure. He denies chills, fever, diarrhea, sore throat, nasal congestion, and other evidence of infection.  Other significant comorbidities that may be contributing to the current situation include chronic kidney disease stage IV. He has had a fistula placed for anticipated dialysis.  There is history of atrial fibrillation with prior cardioversion. He is maintained on amiodarone therapy and is in sinus rhythm today.  Past Medical History  Diagnosis Date  . Hypertension   . Hyperlipidemia   . Hypothyroidism   . CAD (coronary artery disease)     a. prior CABG in 1997 with redo in 2000. b. last cath in 2008 - managed medically  . Type 2 diabetes mellitus ([place])   . Generalized weakness     without overt findings  . Peripheral vascular disease ([place])   . Carotid disease, bilateral ([place])     with multiple bilateral carotid surgeries  . Psoriasis     . Meniere's disease   . Degenerative joint disease   . Gout   . Orthopnea     Two-pillow  . CKD (chronic kidney disease), stage IV ([place])     a. Has fistula in place.   . Chronic diastolic CHF (congestive heart failure) ([place])   . GERD (gastroesophageal reflux disease)   . Atrial fibrillation ([place]) Aug. 2015    a. Dx 01/2014, s/p DCCV 03/06/14.  . Sinus bradycardia     a. Toprol D/C'd due to this.   . Shingles     Past Surgical History  Procedure Laterality Date  . Coronary artery bypass graft  1997 and 2000  . Carotid endarterectomy    . Lung removal, partial    . Laminectomies  July 2001    lumbar laminectomies  . Thoracotomy       left--due to fungal infection  . Shoulder surgery    . Cardiac catheterization    . Appendectomy    . Av fistula placement Left 01/21/2013    Procedure: ARTERIOVENOUS (AV) FISTULA CREATION, Left Brachiocephalic;  Surgeon: Angelia Mould, MD;  Location: [facility];  Service: Vascular;  Laterality: Left;  . Cardiac catheterization  2008    L main irreg, LAD 80%, IMA-LAD & SVG-Diag patent, CFX 100%, SVG-OM 90%, RCA 70%, SVG-RCA OK, EF nl, med rx, no vessels appropriate for PCI  . Tee without cardioversion N/A 03/06/2014    Procedure: TRANSESOPHAGEAL ECHOCARDIOGRAM (TEE);  Surgeon: Larey Dresser, MD;  Location: [facility];  Service: Cardiovascular;  Laterality: N/A;  . Cardioversion N/A 03/06/2014    Procedure: CARDIOVERSION;  Surgeon: Larey Dresser, MD;  Location: [facility];  Service: Cardiovascular;  Laterality: N/A;     Current Outpatient Prescriptions  Medication Sig Dispense Refill  . amiodarone (PACERONE) 200 MG tablet Take 1 tablet (200 mg total) by mouth daily. 60 tablet 5  . amLODipine (NORVASC) 10 MG tablet TAKE ONE TABLET BY MOUTH ONCE DAILY 30 tablet 11  . atorvastatin (LIPITOR) 80 MG tablet TAKE ONE TABLET BY MOUTH ONCE DAILY 90 tablet 3  . cholecalciferol (VITAMIN D) 1000 UNITS tablet Take 1,000 Units by mouth daily at 6 PM.      . doxazosin (CARDURA) 4 MG tablet Take 2 tablets (8 mg total) by mouth daily. 180 tablet 3  . furosemide (LASIX) 80 MG tablet Take one and half (1.5) tablet (120 mg total) by mouth twice daily.    . Insulin Glargine (LANTUS SOLOSTAR) 100 UNIT/ML SOPN Inject 15 Units into the skin every morning.     . isosorbide mononitrate (IMDUR) 60 MG 24 hr tablet TAKE ONE TABLET BY MOUTH ONCE DAILY 90 tablet 2  . KLOR-CON M20 20 MEQ tablet Take 20 mEq by mouth daily.    Marland Kitchen levothyroxine (SYNTHROID, LEVOTHROID) 175 MCG tablet Take 175 mcg by mouth daily before breakfast.    . memantine (NAMENDA) 5 MG tablet Take 5 mg by mouth daily.     . Multiple Vitamin (MULTIVITAMIN WITH MINERALS) TABS Take 1 tablet by mouth every evening.     . nitroGLYCERIN (NITROSTAT) 0.4 MG SL tablet Place 1 tablet (0.4 mg total) under the tongue every 5 (five) minutes as needed for chest pain. 25 tablet 3  . polyethylene glycol (MIRALAX / GLYCOLAX) packet Take 17 g by mouth daily as needed for moderate constipation.    . ranitidine (ZANTAC) 300 MG tablet Take 300 mg by mouth 2 (two) times daily.  30 tablet 11  . silver sulfADIAZINE (SILVADENE) 1 % cream Apply 1 application topically daily. 50 g 0  . traMADol (ULTRAM) 50 MG tablet Take by mouth every 6 (six) hours as needed for moderate pain.    Marland Kitchen trolamine salicylate (ASPERCREME) 10 % cream Apply 1 application topically 2 (two) times daily as needed for muscle pain.     Marland Kitchen warfarin (COUMADIN) 5 MG tablet Take 5 mg by mouth daily.     Marland Kitchen warfarin (COUMADIN) 5 MG tablet TAKE AS DIRECTED 90 tablet 0  . insulin aspart (NOVOLOG FLEXPEN) 100 UNIT/ML SOPN FlexPen Inject 10 Units into the skin 2 (two) times daily. Reported on 09/09/2015     No current facility-administered medications for this visit.    Allergies:   Betadine; Iodinated diagnostic agents; and Latex    Social History:  The patient  reports that he has never smoked. He has never used smokeless tobacco. He reports that he does  not drink alcohol or use illicit drugs.   Family History:  The patient's family history includes Diabetes in his brother and mother; Heart attack in his father; Heart disease in his brother, father, and mother; Hyperlipidemia in his father; Hypertension in his father and mother.    ROS:  Please see the history of present illness.   Otherwise, review of systems are positive for weight gain, decreased appetite, recent chills but no fever. Right greater than left lower extremity swelling. Wakes up short of breath. Dyspnea on exertion. Has cough and shortness of breath at  rest. These are bruising. Some wheezing and difficulty with balance..   All other systems are reviewed and negative.    PHYSICAL EXAM: VS:  BP 144/56 mmHg  Pulse 54  Ht 5\' 10"  (1.778 m)  Wt 211 lb 12.8 oz (96.072 kg)  BMI 30.39 kg/m2 , BMI Body mass index is 30.39 kg/(m^2). GEN: Well nourished, well developed, in no acute distress HEENT: normal Neck: Marked JVD while sitting. There are no carotid bruits, or masses Cardiac: RRR.  There 3/6 apical systolic murmur. No rub, or gallop. There is R>L LE edema to the knees bilaterally. Respiratory:  clear to auscultation bilaterally, normal work of breathing. GI: soft, nontender, nondistended, + BS MS: no deformity or atrophy Skin: warm and dry, no rash Neuro: Marked reduction in hearing. No focal deficit. Ambulates with a walker. Psych: euthymic mood, full affect   EKG:  EKG is ordered today. The ekg reveals marked sinus bradycardia at 52 bpm with interventricular conduction delay and atrial abnormality.   Recent Labs: 01/01/2015: BUN 44*; Creatinine, Ser 2.45*; Potassium 4.8; Sodium 137    Lipid Panel    Component Value Date/Time   CHOL 143 02/13/2014 0815   TRIG 114.0 02/13/2014 0815   HDL 33.00* 02/13/2014 0815   CHOLHDL 4 02/13/2014 0815   VLDL 22.8 02/13/2014 0815   LDLCALC 87 02/13/2014 0815   LDLDIRECT 112.7 09/27/2012 0834      Wt Readings from Last 3  Encounters:  09/09/15 211 lb 12.8 oz (96.072 kg)  06/03/15 189 lb (85.73 kg)  06/02/15 189 lb 8 oz (85.957 kg)      Other studies Reviewed: Additional studies/ records that were reviewed today include: Reviewed patient problem list and prior data.. The findings include other potential issues could be progression of aortic stenosis, progression of ischemic heart disease with bypass graft closure, progression of systolic left ventricular dysfunction . Anemia could also be a plate as well as progression of kidney failure.    ASSESSMENT AND PLAN:  1. Chronic combined systolic and diastolic CHF (congestive heart failure) (H[facility]) Bilateral lower extremity swelling with cough, dyspnea, progressive over the past 2-3 weeks. Weight now up by at least 12 pounds.  2. Persistent atrial fibrillation ([facility]) Currently maintaining sinus rhythm and is currently in sinus bradycardia.  3. Coronary artery disease involving native coronary artery of native heart without angina pectoris Asymptomatic with reference to angina  4. Essential hypertension Controlled  5. Diabetes mellitus, type II with vascular complications Current status not known  6. High risk medication use Chronic amiodarone therapy without obvious evidence of toxicity although we should check a sed rate since he has predominantly pulmonary complaints.  7. Aortic stenosis of uncertain significance. Last evaluation 2015  Current medicines are reviewed at length with the patient today.  The patient has the following concerns regarding medicines: none.  The following changes/actions have been instituted:    The patient will be admitted to the heart failure floor, 3 E.  2-D Doppler echocardiography should be repeated to rule out progression of aortic stenosis and systolic dysfunction  Aggressive IV diuresis  May need a fast heart feed team consultation  Depending upon LVEF and other parameters, further ischemic evaluation may be a  consideration.  Anemia and other metabolic/electrolyte abnormalities will also need to be identified.  Contact admitting team to make aware of the patient's presence in the ER and to follow-up later today.  Labs/ tests ordered today include:  No orders of the defined types were placed in  this encounter.     Disposition:   FU with Dr. Acie Fredrickson after discharge.  Signed, Sinclair Grooms, MD  09/09/2015 12:10 PM    [facility] [address], [place], [place]  [address] Phone: [phone]; Fax: [phone]

## 2015-09-09 NOTE — Progress Notes (Signed)
Cardiology Office Note   Date:  09/09/2015   ID:  [name], DOB April 03, 1934, MRN [id number]  PCP:  Peter Lopes, MD  Cardiologist:  Peter Grooms, MD   Chief Complaint  Patient presents with  . Shortness of Breath      History of Present Illness: [name] is a 82 y.o. male who presents to office DOD with complaint of 2-3 week history of lower extremity swelling, cough, dyspnea, and recent visit with primary physician, [name] who informed the patient and his daughter that he was in heart failure and arrange an outpatient follow-up today.  In discussion with the patient's daughter, he has developed progressive lower extremity edema right leg greater than left. Right lower extremity edema has always been more of an issue than left lower extremity edema. He denies chest pain. For year and half he has slept in a recliner after his last hospitalization for heart failure. There concern now his cough, progressive dyspnea, and the concern about the recent x-ray performed by [name] that suggested congestive heart failure. He denies chills, fever, diarrhea, sore throat, nasal congestion, and other evidence of infection.  Other significant comorbidities that may be contributing to the current situation include chronic kidney disease stage IV. He has had a fistula placed for anticipated dialysis.  There is history of atrial fibrillation with prior cardioversion. He is maintained on amiodarone therapy and is in sinus rhythm today.  Past Medical History  Diagnosis Date  . Hypertension   . Hyperlipidemia   . Hypothyroidism   . CAD (coronary artery disease)     a. prior CABG in 1997 with redo in 2000. b. last cath in 2008 - managed medically  . Type 2 diabetes mellitus ([place])   . Generalized weakness     without overt findings  . Peripheral vascular disease ([place])   . Carotid disease, bilateral ([place])     with multiple bilateral carotid surgeries  . Psoriasis     . Meniere's disease   . Degenerative joint disease   . Gout   . Orthopnea     Two-pillow  . CKD (chronic kidney disease), stage IV ([place])     a. Has fistula in place.   . Chronic diastolic CHF (congestive heart failure) ([place])   . GERD (gastroesophageal reflux disease)   . Atrial fibrillation ([place]) Aug. 2015    a. Dx 01/2014, s/p DCCV 03/06/14.  . Sinus bradycardia     a. Toprol D/C'd due to this.   . Shingles     Past Surgical History  Procedure Laterality Date  . Coronary artery bypass graft  1997 and 2000  . Carotid endarterectomy    . Lung removal, partial    . Laminectomies  July 2001    lumbar laminectomies  . Thoracotomy       left--due to fungal infection  . Shoulder surgery    . Cardiac catheterization    . Appendectomy    . Av fistula placement Left 01/21/2013    Procedure: ARTERIOVENOUS (AV) FISTULA CREATION, Left Brachiocephalic;  Surgeon: Angelia Mould, MD;  Location: [facility];  Service: Vascular;  Laterality: Left;  . Cardiac catheterization  2008    L main irreg, LAD 80%, IMA-LAD & SVG-Diag patent, CFX 100%, SVG-OM 90%, RCA 70%, SVG-RCA OK, EF nl, med rx, no vessels appropriate for PCI  . Tee without cardioversion N/A 03/06/2014    Procedure: TRANSESOPHAGEAL ECHOCARDIOGRAM (TEE);  Surgeon: Larey Dresser, MD;  Location: [facility];  Service: Cardiovascular;  Laterality: N/A;  . Cardioversion N/A 03/06/2014    Procedure: CARDIOVERSION;  Surgeon: Larey Dresser, MD;  Location: [facility];  Service: Cardiovascular;  Laterality: N/A;     Current Outpatient Prescriptions  Medication Sig Dispense Refill  . amiodarone (PACERONE) 200 MG tablet Take 1 tablet (200 mg total) by mouth daily. 60 tablet 5  . amLODipine (NORVASC) 10 MG tablet TAKE ONE TABLET BY MOUTH ONCE DAILY 30 tablet 11  . atorvastatin (LIPITOR) 80 MG tablet TAKE ONE TABLET BY MOUTH ONCE DAILY 90 tablet 3  . cholecalciferol (VITAMIN D) 1000 UNITS tablet Take 1,000 Units by mouth daily at 6 PM.      . doxazosin (CARDURA) 4 MG tablet Take 2 tablets (8 mg total) by mouth daily. 180 tablet 3  . furosemide (LASIX) 80 MG tablet Take one and half (1.5) tablet (120 mg total) by mouth twice daily.    . Insulin Glargine (LANTUS SOLOSTAR) 100 UNIT/ML SOPN Inject 15 Units into the skin every morning.     . isosorbide mononitrate (IMDUR) 60 MG 24 hr tablet TAKE ONE TABLET BY MOUTH ONCE DAILY 90 tablet 2  . KLOR-CON M20 20 MEQ tablet Take 20 mEq by mouth daily.    Marland Kitchen levothyroxine (SYNTHROID, LEVOTHROID) 175 MCG tablet Take 175 mcg by mouth daily before breakfast.    . memantine (NAMENDA) 5 MG tablet Take 5 mg by mouth daily.     . Multiple Vitamin (MULTIVITAMIN WITH MINERALS) TABS Take 1 tablet by mouth every evening.     . nitroGLYCERIN (NITROSTAT) 0.4 MG SL tablet Place 1 tablet (0.4 mg total) under the tongue every 5 (five) minutes as needed for chest pain. 25 tablet 3  . polyethylene glycol (MIRALAX / GLYCOLAX) packet Take 17 g by mouth daily as needed for moderate constipation.    . ranitidine (ZANTAC) 300 MG tablet Take 300 mg by mouth 2 (two) times daily.  30 tablet 11  . silver sulfADIAZINE (SILVADENE) 1 % cream Apply 1 application topically daily. 50 g 0  . traMADol (ULTRAM) 50 MG tablet Take by mouth every 6 (six) hours as needed for moderate pain.    Marland Kitchen trolamine salicylate (ASPERCREME) 10 % cream Apply 1 application topically 2 (two) times daily as needed for muscle pain.     Marland Kitchen warfarin (COUMADIN) 5 MG tablet Take 5 mg by mouth daily.     Marland Kitchen warfarin (COUMADIN) 5 MG tablet TAKE AS DIRECTED 90 tablet 0  . insulin aspart (NOVOLOG FLEXPEN) 100 UNIT/ML SOPN FlexPen Inject 10 Units into the skin 2 (two) times daily. Reported on 09/09/2015     No current facility-administered medications for this visit.    Allergies:   Betadine; Iodinated diagnostic agents; and Latex    Social History:  The patient  reports that he has never smoked. He has never used smokeless tobacco. He reports that he does  not drink alcohol or use illicit drugs.   Family History:  The patient's family history includes Diabetes in his brother and mother; Heart attack in his father; Heart disease in his brother, father, and mother; Hyperlipidemia in his father; Hypertension in his father and mother.    ROS:  Please see the history of present illness.   Otherwise, review of systems are positive for weight gain, decreased appetite, recent chills but no fever. Right greater than left lower extremity swelling. Wakes up short of breath. Dyspnea on exertion. Has cough and shortness of breath at  rest. These are bruising. Some wheezing and difficulty with balance..   All other systems are reviewed and negative.    PHYSICAL EXAM: VS:  BP 144/56 mmHg  Pulse 54  Ht 5\' 10"  (1.778 m)  Wt 211 lb 12.8 oz (96.072 kg)  BMI 30.39 kg/m2 , BMI Body mass index is 30.39 kg/(m^2). GEN: Well nourished, well developed, in no acute distress HEENT: normal Neck: Marked JVD while sitting. There are no carotid bruits, or masses Cardiac: RRR.  There 3/6 apical systolic murmur. No rub, or gallop. There is R>L LE edema to the knees bilaterally. Respiratory:  clear to auscultation bilaterally, normal work of breathing. GI: soft, nontender, nondistended, + BS MS: no deformity or atrophy Skin: warm and dry, no rash Neuro: Marked reduction in hearing. No focal deficit. Ambulates with a walker. Psych: euthymic mood, full affect   EKG:  EKG is ordered today. The ekg reveals marked sinus bradycardia at 52 bpm with interventricular conduction delay and atrial abnormality.   Recent Labs: 01/01/2015: BUN 44*; Creatinine, Ser 2.45*; Potassium 4.8; Sodium 137    Lipid Panel    Component Value Date/Time   CHOL 143 02/13/2014 0815   TRIG 114.0 02/13/2014 0815   HDL 33.00* 02/13/2014 0815   CHOLHDL 4 02/13/2014 0815   VLDL 22.8 02/13/2014 0815   LDLCALC 87 02/13/2014 0815   LDLDIRECT 112.7 09/27/2012 0834      Wt Readings from Last 3  Encounters:  09/09/15 211 lb 12.8 oz (96.072 kg)  06/03/15 189 lb (85.73 kg)  06/02/15 189 lb 8 oz (85.957 kg)      Other studies Reviewed: Additional studies/ records that were reviewed today include: Reviewed patient problem list and prior data.. The findings include other potential issues could be progression of aortic stenosis, progression of ischemic heart disease with bypass graft closure, progression of systolic left ventricular dysfunction . Anemia could also be a plate as well as progression of kidney failure.    ASSESSMENT AND PLAN:  1. Chronic combined systolic and diastolic CHF (congestive heart failure) (HCC) Bilateral lower extremity swelling with cough, dyspnea, progressive over the past 2-3 weeks. Weight now up by at least 12 pounds.  2. Persistent atrial fibrillation (HCC) Currently maintaining sinus rhythm and is currently in sinus bradycardia.  3. Coronary artery disease involving native coronary artery of native heart without angina pectoris Asymptomatic with reference to angina  4. Essential hypertension Controlled  5. Diabetes mellitus, type II with vascular complications Current status not known  6. High risk medication use Chronic amiodarone therapy without obvious evidence of toxicity although we should check a sed rate since he has predominantly pulmonary complaints.  7. Aortic stenosis of uncertain significance. Last evaluation 2015  Current medicines are reviewed at length with the patient today.  The patient has the following concerns regarding medicines: none.  The following changes/actions have been instituted:    The patient will be admitted to the heart failure floor, 3 E.  2-D Doppler echocardiography should be repeated to rule out progression of aortic stenosis and systolic dysfunction.  Aggressive IV diuresis.  May need AHF team consultation.  Depending upon LVEF and other parameters, further ischemic evaluation may be a  consideration.  Anemia and other metabolic/electrolyte abnormalities will also need to be identified.  Contact admitting team to make aware of the patient's presence in the ER and to follow-up later today.  Labs/ tests ordered today include:  No orders of the defined types were placed in this encounter.  Disposition:   FU with Dr. Acie Fredrickson after discharge.  Signed, Peter Grooms, MD  09/09/2015 12:10 PM    [facility] [place], [place], [place]  [address] Phone: [phone]; Fax: [phone]

## 2015-09-09 NOTE — Patient Instructions (Signed)
Medication Instructions:  Your physician recommends that you continue on your current medications as directed. Please refer to the Current Medication list given to you today.   Labwork: None ordered  Testing/Procedures: None ordered  Follow-Up: Please head over to [facility] Emergency Room  Any Other Special Instructions Will Be Listed Below (If Applicable).     If you need a refill on your cardiac medications before your next appointment, please call your pharmacy.

## 2015-09-09 NOTE — Progress Notes (Signed)
Pt was a late admission from ED this afternoon. Initial lasix dose not given yet. Next dose not scheduled until 03:30. MD paged. Orders received to give a lasix dose now. Will continue to monitor.

## 2015-09-09 NOTE — ED Provider Notes (Signed)
MSE was initiated and I personally evaluated the patient and placed orders (if any) at  3:14 PM on September 09, 2015.  The patient was sent from Dr. Darliss Ridgel office to be admitted to the hospital for a CHF exacerbation.   No beds were available, so the patient was sent to the ED.    Pt is breathing comfortable on nasal cannula oxygen.    I will consult with the cardiology service to make sure they are coming down to see him to write admission orders.   EKG Interpretation  Date/Time:  Thursday September 09 2015 13:14:09 EDT Ventricular Rate:  53 PR Interval:  144 QRS Duration: 116 QT Interval:  490 QTC Calculation: 459 R Axis:   85 Text Interpretation:  Sinus bradycardia Anteroseptal infarct , age undetermined Abnormal ECG No significant change since last tracing Confirmed by Symphoni Helbling  MD-J, Nicholous Girgenti [phone]) on 09/09/2015 3:15:58 PM          Dorie Rank, MD 09/09/15 1520

## 2015-09-09 NOTE — Progress Notes (Addendum)
   He had been holding in the Emergency Department.  I have reviewed labs and it appears that kidney function is worsening. Chest x-ray reveals pulmonary edema.  Perhaps the volume overload that is currently present is related more to renal impairment than progressive LV dysfunction.  He may need to have an advanced heart failure team consult.  Given creatinine, IV Lasix dose may need to be increased from 80 mg doses to 120 mg IV.

## 2015-09-09 NOTE — Progress Notes (Signed)
ANTICOAGULATION CONSULT NOTE - Initial Consult  Pharmacy Consult for Coumadin  Indication: atrial fibrillation  Allergies  Allergen Reactions  . Betadine [Povidone Iodine] Rash  . Iodinated Diagnostic Agents Rash and Other (See Comments)    Does fine with premedications for cath  . Latex Hives    Patient Measurements: Weight: 211 lb (95.709 kg)  Vital Signs: Temp: 98.3 F (36.8 C) (03/16 1318) Temp Source: Oral (03/16 1318) BP: 146/61 mmHg (03/16 1500) Pulse Rate: 52 (03/16 1500)  Labs:  Recent Labs  09/09/15 1326  HGB 10.3*  HCT 32.6*  PLT 109*  CREATININE 3.34*    Estimated Creatinine Clearance: 20.8 mL/min (by C-G formula based on Cr of 3.34).   Medical History: Past Medical History  Diagnosis Date  . Hypertension   . Hyperlipidemia   . Hypothyroidism   . CAD (coronary artery disease)     a. prior CABG in 1997 with redo in 2000. b. last cath in 2008 - managed medically  . Type 2 diabetes mellitus ([place])   . Generalized weakness     without overt findings  . Peripheral vascular disease ([place])   . Carotid disease, bilateral ([place])     with multiple bilateral carotid surgeries  . Psoriasis   . Meniere's disease   . Degenerative joint disease   . Gout   . Orthopnea     Two-pillow  . CKD (chronic kidney disease), stage IV ([place])     a. Has fistula in place.   . Chronic diastolic CHF (congestive heart failure) ([place])   . GERD (gastroesophageal reflux disease)   . Atrial fibrillation ([place]) Aug. 2015    a. Dx 01/2014, s/p DCCV 03/06/14.  . Sinus bradycardia     a. Toprol D/C'd due to this.   . Shingles      Assessment: 22 yom admitted 09/09/2015 with SOB. Patient on coumadi PTA for AFib. Pharmacy consulted to continue dosing inpatient.   PTA coumadin dose = 5mg  on Sundays and Thursdays, and 2,5mg  on Mon, Tue, Wed, and Sat. (Last dose 3/15)  INR on admit 3.93, Hgb 10.3, PLT 109, no bleeding noted  Goal of Therapy:  INR 2-3 Monitor platelets by  anticoagulation protocol: Yes   Plan:  Hold coumadin dose tonight  Daily INR/CBC Monitor for s/s of bleeding   Harris Kistler C. Lennox Grumbles, PharmD Pharmacy Resident  Pager: [phone] 09/09/2015 3:58 PM

## 2015-09-10 ENCOUNTER — Ambulatory Visit (HOSPITAL_COMMUNITY): Payer: Medicare Other

## 2015-09-10 ENCOUNTER — Inpatient Hospital Stay (HOSPITAL_COMMUNITY): Payer: Medicare Other

## 2015-09-10 DIAGNOSIS — I509 Heart failure, unspecified: Secondary | ICD-10-CM

## 2015-09-10 LAB — GLUCOSE, CAPILLARY
GLUCOSE-CAPILLARY: 404 mg/dL — AB (ref 65–99)
GLUCOSE-CAPILLARY: 178 mg/dL — AB (ref 65–99)
Glucose-Capillary: 83 mg/dL (ref 65–99)
Glucose-Capillary: 114 mg/dL — ABNORMAL HIGH (ref 65–99)
Glucose-Capillary: 303 mg/dL — ABNORMAL HIGH (ref 65–99)

## 2015-09-10 LAB — BASIC METABOLIC PANEL
ANION GAP: 12 (ref 5–15)
BUN: 83 mg/dL — ABNORMAL HIGH (ref 6–20)
CALCIUM: 8.5 mg/dL — AB (ref 8.9–10.3)
CO2: 25 mmol/L (ref 22–32)
Chloride: 101 mmol/L (ref 101–111)
Creatinine, Ser: 3.29 mg/dL — ABNORMAL HIGH (ref 0.61–1.24)
GFR calc Af Amer: 19 mL/min — ABNORMAL LOW (ref >60)
GFR calc non Af Amer: 16 mL/min — ABNORMAL LOW (ref >60)
GLUCOSE: 80 mg/dL (ref 65–99)
Potassium: 4.2 mmol/L (ref 3.5–5.1)
Sodium: 138 mmol/L (ref 135–145)

## 2015-09-10 LAB — PROTIME-INR
INR: 3.54 — ABNORMAL HIGH (ref 0.00–1.49)
Prothrombin Time: 34.7 seconds — ABNORMAL HIGH (ref 11.6–15.2)

## 2015-09-10 LAB — SEDIMENTATION RATE: SED RATE: 37 mm/h — AB (ref 0–16)

## 2015-09-10 LAB — ECHOCARDIOGRAM COMPLETE
Height: 70 in
WEIGHTICAEL: 3209.6 [oz_av]

## 2015-09-10 LAB — TROPONIN I: Troponin I: 0.04 ng/mL — ABNORMAL HIGH (ref <0.031)

## 2015-09-10 MED ORDER — ZOLPIDEM TARTRATE 5 MG PO TABS
2.5000 mg | ORAL_TABLET | Freq: Once | ORAL | Status: AC
  Administered 2015-09-10: 2.5 mg via ORAL
  Filled 2015-09-10: qty 1

## 2015-09-10 MED ORDER — ZOLPIDEM TARTRATE 5 MG PO TABS: 2.5000 mg | ORAL_TABLET | Freq: Every evening | ORAL | Status: DC | PRN

## 2015-09-10 NOTE — Progress Notes (Signed)
PROGRESS NOTE  Subjective:   82 yo with hx of CAD , CABG, CKD stage 4, HTN, hypothyroidism Admitted from the office with 2-3 week hx of progressive leg swelling and CHF symptoms   His creatinine is 3.29 He has had a dialysis fistula place.  Troponin levels are not c/w ACS   Objective:    Vital Signs:   Temp:  [97.9 F (36.6 C)-98.4 F (36.9 C)] 97.9 F (36.6 C) (03/17 0520) Pulse Rate:  [50-58] 50 (03/17 0520) Resp:  [17-24] 22 (03/17 0520) BP: (144-171)/(55-62) 159/55 mmHg (03/17 0520) SpO2:  [88 %-99 %] 99 % (03/17 0520) Weight:  [200 lb 9.6 oz (90.992 kg)-211 lb 12.8 oz (96.072 kg)] 200 lb 9.6 oz (90.992 kg) (03/17 0520)  Last BM Date: 09/09/15   24-hour weight change: Weight change:   Weight trends: Filed Weights   09/09/15 1318 09/09/15 1658 09/10/15 0520  Weight: 211 lb (95.709 kg) 203 lb (92.08 kg) 200 lb 9.6 oz (90.992 kg)    Intake/Output:        Physical Exam: BP 159/55 mmHg  Pulse 50  Temp(Src) 97.9 F (36.6 C) (Oral)  Resp 22  Ht 5\' 10"  (1.778 m)  Wt 200 lb 9.6 oz (90.992 kg)  BMI 28.78 kg/m2  SpO2 99%  Wt Readings from Last 3 Encounters:  09/10/15 200 lb 9.6 oz (90.992 kg)  09/09/15 211 lb 12.8 oz (96.072 kg)  06/03/15 189 lb (85.73 kg)    General: Vital signs reviewed and noted.   Head: Normocephalic, atraumatic.  Eyes: conjunctivae/corneas clear.  EOM's intact.   Throat: normal  Neck:  normal   Lungs:    wheezes and rales   Heart:  RR, 2/6 systolic murmur   Abdomen:  Soft, non-tender, non-distended    Extremities: 1-2+ edema , no clubbing    Neurologic: A&O X3, CN II - XII are grossly intact.   Psych: Normal     Labs: BMET:  Recent Labs  09/09/15 1542 09/10/15 0337  NA 135 138  K 4.3 4.2  CL 99* 101  CO2 22 25  GLUCOSE 121* 80  BUN 83* 83*  CREATININE 3.27* 3.29*  CALCIUM 8.6* 8.5*  MG 2.5*  --     Liver function tests:  Recent Labs  09/09/15 1326 09/09/15 1542  AST 63* 60*  ALT 63 67*  ALKPHOS 74  75  BILITOT 0.7 0.7  PROT 6.0* 6.3*  ALBUMIN 3.4* 3.6   No results for input(s): LIPASE, AMYLASE in the last 72 hours.  CBC:  Recent Labs  09/09/15 1326 09/09/15 1542  WBC 5.9 6.1  NEUTROABS  --  5.5  HGB 10.3* 10.8*  HCT 32.6* 34.6*  MCV 90.1 89.2  PLT 109* 120*    Cardiac Enzymes:  Recent Labs  09/09/15 1542 09/09/15 2140 09/10/15 0337  TROPONINI 0.03 0.03 0.04*    Coagulation Studies:  Recent Labs  09/09/15 1542 09/10/15 0337  LABPROT 37.5* 34.7*  INR 3.93* 3.54*    Other: Invalid input(s): POCBNP No results for input(s): DDIMER in the last 72 hours. No results for input(s): HGBA1C in the last 72 hours. No results for input(s): CHOL, HDL, LDLCALC, TRIG, CHOLHDL in the last 72 hours.  Recent Labs  09/09/15 1542  TSH 1.545   No results for input(s): VITAMINB12, FOLATE, FERRITIN, TIBC, IRON, RETICCTPCT in the last 72 hours.   Other results:  EKG  ( personally reviewed )  Sinus brady ,  No acute ST or  T wave changes     Medications:    Infusions:    Scheduled Medications: . atorvastatin  80 mg Oral q1800  . cyanocobalamin  1,000 mcg Intramuscular Once  . doxazosin  8 mg Oral Daily  . furosemide  120 mg Intravenous BID  . heparin  5,000 Units Subcutaneous 3 times per day  . insulin aspart  0-15 Units Subcutaneous TID WC  . insulin aspart  0-5 Units Subcutaneous QHS  . insulin glargine  15 Units Subcutaneous Daily  . isosorbide mononitrate  60 mg Oral Daily  . memantine  5 mg Oral Daily  . pantoprazole  40 mg Oral Q0600  . potassium chloride  20 mEq Oral Daily  . sodium chloride flush  3 mL Intravenous Q12H    Assessment/ Plan:   Active Problems:   Acute on chronic systolic heart failure ([place])  1. Acute on chronic cHF:   His last echo shows normal LV systolic function , grade 1 diastolic dysfunction. This may be worsening renal function as well Continue IV lasix Repeat echo  Continue medical management+   2. CAD  Troponins are  0.04 .   Not c/w ACS.  No plans for further evaluation at this time  3. CKD - stage 4   Disposition:  Length of Stay: 1  Thayer Headings, Brooke Bonito., MD, [facility] 09/10/2015, 8:30 AM Office [phone] Pager [phone]  '

## 2015-09-10 NOTE — Progress Notes (Signed)
  Echocardiogram 2D Echocardiogram has been performed.  Jennette Dubin 09/10/2015, 11:09 AM

## 2015-09-10 NOTE — Progress Notes (Signed)
1500paced a call to MD np pao called back repeat cbg done 303 11 units given as coverage

## 2015-09-10 NOTE — Progress Notes (Addendum)
[facility] for Coumadin  Indication: atrial fibrillation  Allergies  Allergen Reactions  . Betadine [Povidone Iodine] Rash  . Iodinated Diagnostic Agents Rash and Other (See Comments)    Does fine with premedications for cath  . Latex Hives    Patient Measurements: Height: 5\' 10"  (177.8 cm) Weight: 200 lb 9.6 oz (90.992 kg) IBW/kg (Calculated) : 73  Vital Signs: Temp: 97.9 F (36.6 C) (03/17 0520) Temp Source: Oral (03/17 0520) BP: 159/55 mmHg (03/17 0520) Pulse Rate: 50 (03/17 0520)  Labs:  Recent Labs  09/09/15 1326 09/09/15 1542 09/09/15 2140 09/10/15 0337  HGB 10.3* 10.8*  --   --   HCT 32.6* 34.6*  --   --   PLT 109* 120*  --   --   APTT 54*  --   --   --   LABPROT  --  37.5*  --  34.7*  INR  --  3.93*  --  3.54*  CREATININE 3.34* 3.27*  --  3.29*  TROPONINI  --  0.03 0.03 0.04*    Estimated Creatinine Clearance: 20.7 mL/min (by C-G formula based on Cr of 3.29).    Assessment: 66 yom admitted 09/09/2015 with SOB. Patient on coumadin PTA for AFib. Pharmacy consulted to continue dosing inpatient.  -INR=3.54 (elevated likely in setting of HF)  PTA coumadin dose = 5mg  on Sundays and Thursdays, and 2,5mg  on Mon, Tue, Wed, and Sat. (Last dose 3/15)3.54    Goal of Therapy:  INR 2-3 Monitor platelets by anticoagulation protocol: Yes   Plan:  Hold coumadin dose tonight  Daily INR D/c sq heparin  Hildred Laser, Pharm D 09/10/2015 8:56 AM

## 2015-09-11 LAB — BASIC METABOLIC PANEL
Anion gap: 11 (ref 5–15)
BUN: 82 mg/dL — AB (ref 6–20)
CALCIUM: 8.6 mg/dL — AB (ref 8.9–10.3)
CO2: 24 mmol/L (ref 22–32)
CREATININE: 3.15 mg/dL — AB (ref 0.61–1.24)
Chloride: 103 mmol/L (ref 101–111)
GFR calc non Af Amer: 17 mL/min — ABNORMAL LOW (ref >60)
GFR, EST AFRICAN AMERICAN: 20 mL/min — AB (ref >60)
Glucose, Bld: 108 mg/dL — ABNORMAL HIGH (ref 65–99)
Potassium: 4 mmol/L (ref 3.5–5.1)
SODIUM: 138 mmol/L (ref 135–145)

## 2015-09-11 LAB — PROTIME-INR
INR: 3.01 — AB (ref 0.00–1.49)
PROTHROMBIN TIME: 30.7 s — AB (ref 11.6–15.2)

## 2015-09-11 LAB — GLUCOSE, CAPILLARY
GLUCOSE-CAPILLARY: 204 mg/dL — AB (ref 65–99)
GLUCOSE-CAPILLARY: 168 mg/dL — AB (ref 65–99)
Glucose-Capillary: 90 mg/dL (ref 65–99)
Glucose-Capillary: 157 mg/dL — ABNORMAL HIGH (ref 65–99)
Glucose-Capillary: 143 mg/dL — ABNORMAL HIGH (ref 65–99)

## 2015-09-11 MED ORDER — GUAIFENESIN 100 MG/5ML PO SYRP
200.0000 mg | ORAL_SOLUTION | Freq: Four times a day (QID) | ORAL | Status: DC | PRN
  Administered 2015-09-11 – 2015-09-25 (×16): 200 mg via ORAL
  Filled 2015-09-11 (×26): qty 10

## 2015-09-11 MED ORDER — WARFARIN - PHARMACIST DOSING INPATIENT
Freq: Every day | Status: DC
Start: 2015-09-11 — End: 2015-09-29
  Administered 2015-09-13 – 2015-09-28 (×7)

## 2015-09-11 MED ORDER — WARFARIN SODIUM 2.5 MG PO TABS
2.5000 mg | ORAL_TABLET | Freq: Once | ORAL | Status: AC
  Administered 2015-09-11: 2.5 mg via ORAL
  Filled 2015-09-11: qty 1

## 2015-09-11 NOTE — H&P (Addendum)
[name] is a 82 y.o. male   Re-labeled note as H and P. No changes from initial note accidentally labeled OV.   Admit Date: 09/09/2015 Referring Physician: Philip Aspen, MD Primary Cardiologist:: Nahser, MD Chief complaint / reason for admission: CHF  HISTORY AND PHYSICAL  Date: 09/09/2015   ID: [name], DOB Jun 25, 1934, MRN [id number]  PCP: Donnajean Lopes, MD Cardiologist: Sinclair Grooms, MD   Chief Complaint  Patient presents with  . Shortness of Breath     History of Present Illness: [name] is a 82 y.o. male who presents to office DOD with complaint of 2-3 week history of lower extremity swelling, cough, dyspnea, and recent visit with primary physician, Dr. Philip Aspen who informed the patient and his daughter that he was in heart failure and arrange an outpatient follow-up today.  In discussion with the patient's daughter, he has developed progressive lower extremity edema right leg greater than left. Right lower extremity edema has always been more of an issue than left lower extremity edema. He denies chest pain. For year and half he has slept in a recliner after his last hospitalization for heart failure. There concern now his cough, progressive dyspnea, and the concern about the recent x-ray performed by Dr. Philip Aspen that suggested congestive heart failure. He denies chills, fever, diarrhea, sore throat, nasal congestion, and other evidence of infection.  Other significant comorbidities that may be contributing to the current situation include chronic kidney disease stage IV. He has had a fistula placed for anticipated dialysis.  There is history of atrial fibrillation with prior cardioversion. He is maintained on amiodarone therapy and is in sinus rhythm today.  Past Medical History  Diagnosis Date  . Hypertension   . Hyperlipidemia   . Hypothyroidism   . CAD (coronary artery disease)     a. prior CABG in  1997 with redo in 2000. b. last cath in 2008 - managed medically  . Type 2 diabetes mellitus ([place])   . Generalized weakness     without overt findings  . Peripheral vascular disease ([place])   . Carotid disease, bilateral ([place])     with multiple bilateral carotid surgeries  . Psoriasis   . Meniere's disease   . Degenerative joint disease   . Gout   . Orthopnea     Two-pillow  . CKD (chronic kidney disease), stage IV ([place])     a. Has fistula in place.   . Chronic diastolic CHF (congestive heart failure) ([place])   . GERD (gastroesophageal reflux disease)   . Atrial fibrillation ([place]) Aug. 2015    a. Dx 01/2014, s/p DCCV 03/06/14.  . Sinus bradycardia     a. Toprol D/C'd due to this.   . Shingles     Past Surgical History  Procedure Laterality Date  . Coronary artery bypass graft  1997 and 2000  . Carotid endarterectomy    . Lung removal, partial    . Laminectomies  July 2001    lumbar laminectomies  . Thoracotomy      left--due to fungal infection  . Shoulder surgery    . Cardiac catheterization    . Appendectomy    . Av fistula placement Left 01/21/2013    Procedure: ARTERIOVENOUS (AV) FISTULA CREATION, Left Brachiocephalic; Surgeon: Angelia Mould, MD; Location: [place]; Service: Vascular; Laterality: Left;  . Cardiac catheterization  2008    L main irreg, LAD 80%, IMA-LAD & SVG-Diag patent, CFX 100%, SVG-OM 90%, RCA 70%,  SVG-RCA OK, EF nl, med rx, no vessels appropriate for PCI  . Tee without cardioversion N/A 03/06/2014    Procedure: TRANSESOPHAGEAL ECHOCARDIOGRAM (TEE); Surgeon: Larey Dresser, MD; Location: [place]; Service: Cardiovascular; Laterality: N/A;  . Cardioversion N/A 03/06/2014    Procedure: CARDIOVERSION; Surgeon: Larey Dresser, MD; Location: [place]; Service: Cardiovascular; Laterality: N/A;      Current Outpatient Prescriptions  Medication Sig Dispense Refill  . amiodarone (PACERONE) 200 MG tablet Take 1 tablet (200 mg total) by mouth daily. 60 tablet 5  . amLODipine (NORVASC) 10 MG tablet TAKE ONE TABLET BY MOUTH ONCE DAILY 30 tablet 11  . atorvastatin (LIPITOR) 80 MG tablet TAKE ONE TABLET BY MOUTH ONCE DAILY 90 tablet 3  . cholecalciferol (VITAMIN D) 1000 UNITS tablet Take 1,000 Units by mouth daily at 6 PM.     . doxazosin (CARDURA) 4 MG tablet Take 2 tablets (8 mg total) by mouth daily. 180 tablet 3  . furosemide (LASIX) 80 MG tablet Take one and half (1.5) tablet (120 mg total) by mouth twice daily.    . Insulin Glargine (LANTUS SOLOSTAR) 100 UNIT/ML SOPN Inject 15 Units into the skin every morning.     . isosorbide mononitrate (IMDUR) 60 MG 24 hr tablet TAKE ONE TABLET BY MOUTH ONCE DAILY 90 tablet 2  . KLOR-CON M20 20 MEQ tablet Take 20 mEq by mouth daily.    Marland Kitchen levothyroxine (SYNTHROID, LEVOTHROID) 175 MCG tablet Take 175 mcg by mouth daily before breakfast.    . memantine (NAMENDA) 5 MG tablet Take 5 mg by mouth daily.     . Multiple Vitamin (MULTIVITAMIN WITH MINERALS) TABS Take 1 tablet by mouth every evening.     . nitroGLYCERIN (NITROSTAT) 0.4 MG SL tablet Place 1 tablet (0.4 mg total) under the tongue every 5 (five) minutes as needed for chest pain. 25 tablet 3  . polyethylene glycol (MIRALAX / GLYCOLAX) packet Take 17 g by mouth daily as needed for moderate constipation.    . ranitidine (ZANTAC) 300 MG tablet Take 300 mg by mouth 2 (two) times daily.  30 tablet 11  . silver sulfADIAZINE (SILVADENE) 1 % cream Apply 1 application topically daily. 50 g 0  . traMADol (ULTRAM) 50 MG tablet Take by mouth every 6 (six) hours as needed for moderate pain.    Marland Kitchen trolamine salicylate (ASPERCREME) 10 % cream Apply 1 application topically 2 (two) times daily as needed for muscle pain.      Marland Kitchen warfarin (COUMADIN) 5 MG tablet Take 5 mg by mouth daily.     Marland Kitchen warfarin (COUMADIN) 5 MG tablet TAKE AS DIRECTED 90 tablet 0  . insulin aspart (NOVOLOG FLEXPEN) 100 UNIT/ML SOPN FlexPen Inject 10 Units into the skin 2 (two) times daily. Reported on 09/09/2015     No current facility-administered medications for this visit.    Allergies: Betadine; Iodinated diagnostic agents; and Latex    Social History: The patient reports that he has never smoked. He has never used smokeless tobacco. He reports that he does not drink alcohol or use illicit drugs.   Family History: The patient's family history includes Diabetes in his brother and mother; Heart attack in his father; Heart disease in his brother, father, and mother; Hyperlipidemia in his father; Hypertension in his father and mother.    ROS: Please see the history of present illness. Otherwise, review of systems are positive for weight gain, decreased appetite, recent chills but no fever. Right greater than left lower extremity  swelling. Wakes up short of breath. Dyspnea on exertion. Has cough and shortness of breath at rest. These are bruising. Some wheezing and difficulty with balance.. All other systems are reviewed and negative.    PHYSICAL EXAM: VS: BP 144/56 mmHg  Pulse 54  Ht 5\' 10"  (1.778 m)  Wt 211 lb 12.8 oz (96.072 kg)  BMI 30.39 kg/m2 , BMI Body mass index is 30.39 kg/(m^2). GEN: Well nourished, well developed, in no acute distress  HEENT: normal  Neck: Marked JVD while sitting. There are no carotid bruits, or masses Cardiac: RRR. There 3/6 apical systolic murmur. No rub, or gallop. There is R>L LE edema to the knees bilaterally. Respiratory: clear to auscultation bilaterally, normal work of breathing. GI: soft, nontender, nondistended, + BS MS: no deformity or atrophy  Skin: warm and dry, no rash Neuro: Marked reduction in hearing. No focal deficit. Ambulates with a walker. Psych:  euthymic mood, full affect   EKG: EKG is ordered today. The ekg reveals marked sinus bradycardia at 52 bpm with interventricular conduction delay and atrial abnormality.   Recent Labs: 01/01/2015: BUN 44*; Creatinine, Ser 2.45*; Potassium 4.8; Sodium 137    Lipid Panel   Component Value Date/Time   CHOL 143 02/13/2014 0815   TRIG 114.0 02/13/2014 0815   HDL 33.00* 02/13/2014 0815   CHOLHDL 4 02/13/2014 0815   VLDL 22.8 02/13/2014 0815   LDLCALC 87 02/13/2014 0815   LDLDIRECT 112.7 09/27/2012 0834     Wt Readings from Last 3 Encounters:  09/09/15 211 lb 12.8 oz (96.072 kg)  06/03/15 189 lb (85.73 kg)  06/02/15 189 lb 8 oz (85.957 kg)     Other studies Reviewed: Additional studies/ records that were reviewed today include: Reviewed patient problem list and prior data.. The findings include other potential issues could be progression of aortic stenosis, progression of ischemic heart disease with bypass graft closure, progression of systolic left ventricular dysfunction . Anemia could also be a plate as well as progression of kidney failure.    ASSESSMENT AND PLAN:  1. Chronic combined systolic and diastolic CHF (congestive heart failure) (HCC) Bilateral lower extremity swelling with cough, dyspnea, progressive over the past 2-3 weeks. Weight now up by at least 12 pounds.  2. Persistent atrial fibrillation (HCC) Currently maintaining sinus rhythm and is currently in sinus bradycardia.  3. Coronary artery disease involving native coronary artery of native heart without angina pectoris Asymptomatic with reference to angina  4. Essential hypertension Controlled  5. Diabetes mellitus, type II with vascular complications Current status not known  6. High risk medication use Chronic amiodarone therapy without obvious evidence of toxicity although we should check a sed rate since he has predominantly pulmonary complaints.  7.  Aortic stenosis of uncertain significance. Last evaluation 2015  Current medicines are reviewed at length with the patient today. The patient has the following concerns regarding medicines: none.  The following changes/actions have been instituted:   The patient will be admitted to the heart failure floor, 3 E.  2-D Doppler echocardiography should be repeated to rule out progression of aortic stenosis and systolic dysfunction  Aggressive IV diuresis  May need a fast heart feed team consultation  Depending upon LVEF and other parameters, further ischemic evaluation may be a consideration.  Anemia and other metabolic/electrolyte abnormalities will also need to be identified.  Contact admitting team to make aware of the patient's presence in the ER and to follow-up later today.  Labs/ tests ordered today include:  No  orders of the defined types were placed in this encounter.    Disposition: FU with Dr. Acie Fredrickson after discharge.  Signed, Sinclair Grooms, MD  09/09/2015 12:10 PM  [facility] [place], [place], [place] [address] Phone: [phone]; Fax: [phone]        Routing History     Date/Time From To Method   09/09/2015 1:42 PM Belva Crome, MD Leanna Battles, MD Fax             ROS

## 2015-09-11 NOTE — Progress Notes (Signed)
[place] for Coumadin  Indication: atrial fibrillation  Allergies  Allergen Reactions  . Betadine [Povidone Iodine] Rash  . Iodinated Diagnostic Agents Rash and Other (See Comments)    Does fine with premedications for cath  . Latex Hives    Patient Measurements: Height: 5\' 10"  (177.8 cm) Weight: 200 lb 1.6 oz (90.765 kg) IBW/kg (Calculated) : 73  Vital Signs: Temp: 98.4 F (36.9 C) (03/18 1200) Temp Source: Oral (03/18 1200) BP: 149/49 mmHg (03/18 1200) Pulse Rate: 53 (03/18 1200)  Labs:  Recent Labs  09/09/15 1326 09/09/15 1542 09/09/15 2140 09/10/15 0337 09/11/15 0527  HGB 10.3* 10.8*  --   --   --   HCT 32.6* 34.6*  --   --   --   PLT 109* 120*  --   --   --   APTT 54*  --   --   --   --   LABPROT  --  37.5*  --  34.7* 30.7*  INR  --  3.93*  --  3.54* 3.01*  CREATININE 3.34* 3.27*  --  3.29* 3.15*  TROPONINI  --  0.03 0.03 0.04*  --     Estimated Creatinine Clearance: 21.5 mL/min (by C-G formula based on Cr of 3.15).    Assessment: 89 yom admitted 09/09/2015 with SOB. Patient on coumadin PTA for AFib. Pharmacy consulted to continue dosing inpatient.   INR therapeutic at 3.01. Hgb 10.8, Plts 120.  No s/sx bleeding noted.   PTA coumadin dose = 5mg  on Sundays and Thursdays, and 2,5mg  on Mon, Tue, Wed, and Sat. (Last dose 3/15)   Goal of Therapy:  INR 2-3 Monitor platelets by anticoagulation protocol: Yes   Plan:  -Warfarin 2.5 mg x 1 -daily PT/INR, cbc q3days -Monitor for s/sx of bleeding   Bennye Alm, PharmD Pharmacy Resident [phone]

## 2015-09-11 NOTE — Progress Notes (Signed)
    SUBJECTIVE:  SOB.  No acute distress   PHYSICAL EXAM Filed Vitals:   09/10/15 1200 09/10/15 1617 09/10/15 2041 09/11/15 0500  BP: 164/54 165/51 165/55 163/50  Pulse: 55 54 54 53  Temp: 98.1 F (36.7 C) 98 F (36.7 C) 98.2 F (36.8 C) 98 F (36.7 C)  TempSrc: Oral Oral Oral Oral  Resp: 18 17 18 24   Height:      Weight:    200 lb 1.6 oz (90.765 kg)  SpO2: 94% 96% 94% 90%   General:  No distress Lungs:  Decreased breath sounds Heart:  RRR Abdomen:  distended Extremities:  Severe edema   LABS: Lab Results  Component Value Date   TROPONINI 0.04* 09/10/2015   Results for orders placed or performed during the hospital encounter of 09/09/15 (from the past 24 hour(s))  Glucose, capillary     Status: Abnormal   Collection Time: 09/10/15  4:37 PM  Result Value Ref Range   Glucose-Capillary 404 (H) 65 - 99 mg/dL  Glucose, capillary     Status: Abnormal   Collection Time: 09/10/15  6:54 PM  Result Value Ref Range   Glucose-Capillary 303 (H) 65 - 99 mg/dL  Glucose, capillary     Status: Abnormal   Collection Time: 09/10/15  9:45 PM  Result Value Ref Range   Glucose-Capillary 178 (H) 65 - 99 mg/dL  Basic metabolic panel     Status: Abnormal   Collection Time: 09/11/15  5:27 AM  Result Value Ref Range   Sodium 138 135 - 145 mmol/L   Potassium 4.0 3.5 - 5.1 mmol/L   Chloride 103 101 - 111 mmol/L   CO2 24 22 - 32 mmol/L   Glucose, Bld 108 (H) 65 - 99 mg/dL   BUN 82 (H) 6 - 20 mg/dL   Creatinine, Ser 3.15 (H) 0.61 - 1.24 mg/dL   Calcium 8.6 (L) 8.9 - 10.3 mg/dL   GFR calc non Af Amer 17 (L) >60 mL/min   GFR calc Af Amer 20 (L) >60 mL/min   Anion gap 11 5 - 15  Protime-INR     Status: Abnormal   Collection Time: 09/11/15  5:27 AM  Result Value Ref Range   Prothrombin Time 30.7 (H) 11.6 - 15.2 seconds   INR 3.01 (H) 0.00 - 1.49  Glucose, capillary     Status: None   Collection Time: 09/11/15  5:36 AM  Result Value Ref Range   Glucose-Capillary 90 65 - 99 mg/dL   Comment 1 Notify RN     Intake/Output Summary (Last 24 hours) at 09/11/15 1148 Last data filed at 09/11/15 0900  Gross per 24 hour  Intake    963 ml  Output      0 ml  Net    963 ml    ASSESSMENT AND PLAN:  ACUTE ON CHRONIC SYSTOLIC AND DIASTOLIC HF:    I/O incomplete.   Weight unchanged.  Continue IV diuresis.    CAD:   No ischemia    HTN:  The blood pressure is elevated.    AS:  Moderate AS on echo.    CKD:    Creat is elevated but stable.    Jeneen Rinks [facility] 09/11/2015 11:48 AM

## 2015-09-11 NOTE — Progress Notes (Signed)
Utilization Review Completed.[name] T3/18/2017  

## 2015-09-12 DIAGNOSIS — I110 Hypertensive heart disease with heart failure: Secondary | ICD-10-CM

## 2015-09-12 DIAGNOSIS — I350 Nonrheumatic aortic (valve) stenosis: Secondary | ICD-10-CM

## 2015-09-12 DIAGNOSIS — I5033 Acute on chronic diastolic (congestive) heart failure: Secondary | ICD-10-CM

## 2015-09-12 DIAGNOSIS — E785 Hyperlipidemia, unspecified: Secondary | ICD-10-CM

## 2015-09-12 LAB — BASIC METABOLIC PANEL
Anion gap: 9 (ref 5–15)
BUN: 82 mg/dL — AB (ref 6–20)
CHLORIDE: 102 mmol/L (ref 101–111)
CO2: 26 mmol/L (ref 22–32)
CREATININE: 2.97 mg/dL — AB (ref 0.61–1.24)
Calcium: 8.3 mg/dL — ABNORMAL LOW (ref 8.9–10.3)
GFR, EST AFRICAN AMERICAN: 22 mL/min — AB (ref >60)
GFR, EST NON AFRICAN AMERICAN: 19 mL/min — AB (ref >60)
Glucose, Bld: 188 mg/dL — ABNORMAL HIGH (ref 65–99)
POTASSIUM: 4.1 mmol/L (ref 3.5–5.1)
SODIUM: 137 mmol/L (ref 135–145)

## 2015-09-12 LAB — GLUCOSE, CAPILLARY
GLUCOSE-CAPILLARY: 141 mg/dL — AB (ref 65–99)
GLUCOSE-CAPILLARY: 132 mg/dL — AB (ref 65–99)
Glucose-Capillary: 178 mg/dL — ABNORMAL HIGH (ref 65–99)
Glucose-Capillary: 196 mg/dL — ABNORMAL HIGH (ref 65–99)

## 2015-09-12 LAB — PROTIME-INR
INR: 3.1 — AB (ref 0.00–1.49)
PROTHROMBIN TIME: 31.3 s — AB (ref 11.6–15.2)

## 2015-09-12 LAB — CBC
HCT: 31.8 % — ABNORMAL LOW (ref 39.0–52.0)
HEMOGLOBIN: 10.1 g/dL — AB (ref 13.0–17.0)
MCH: 28.5 pg (ref 26.0–34.0)
MCHC: 31.8 g/dL (ref 30.0–36.0)
MCV: 89.6 fL (ref 78.0–100.0)
PLATELETS: 120 10*3/uL — AB (ref 150–400)
RBC: 3.55 MIL/uL — ABNORMAL LOW (ref 4.22–5.81)
RDW: 15.7 % — ABNORMAL HIGH (ref 11.5–15.5)
WBC: 5.1 10*3/uL (ref 4.0–10.5)

## 2015-09-12 MED ORDER — HYDRALAZINE HCL 25 MG PO TABS
25.0000 mg | ORAL_TABLET | Freq: Two times a day (BID) | ORAL | Status: DC
  Administered 2015-09-12 – 2015-09-13 (×3): 25 mg via ORAL
  Filled 2015-09-12 (×3): qty 1

## 2015-09-12 MED ORDER — WARFARIN SODIUM 2 MG PO TABS
1.0000 mg | ORAL_TABLET | Freq: Once | ORAL | Status: AC
  Administered 2015-09-12: 1 mg via ORAL
  Filled 2015-09-12: qty 0.5

## 2015-09-12 NOTE — Progress Notes (Addendum)
History 82 yo with hx of CAD , CABG, CKD stage 4, HTN, hypothyroidism Admitted from the office with 2-3 week hx of progressive leg swelling and CHF symptoms   SUBJECTIVE:   "I don't feel well".  Diuresed 1.1L negative overnight (but wife says condom cath leaked). Creatinine is improving with diuresis (3.29, 3.15, 2.97). BNP was 729 (on 3/16). Weight down 2 lbs since yesterday.  PHYSICAL EXAM Filed Vitals:   09/11/15 1200 09/11/15 1510 09/11/15 2033 09/12/15 0705  BP: 149/49  159/56 156/53  Pulse: 53 55 55   Temp: 98.4 F (36.9 C)  97.5 F (36.4 C)   TempSrc: Oral  Oral Oral  Resp: 22  20 22   Height:      Weight:    198 lb 14.4 oz (90.22 kg)  SpO2: 90% 93% 93% 91%   General:  No distress Lungs:  Decreased breath sounds Heart:  RRR Abdomen:  distended Extremities:  3+ edema  LABS: Lab Results  Component Value Date   TROPONINI 0.04* 09/10/2015   Results for orders placed or performed during the hospital encounter of 09/09/15 (from the past 24 hour(s))  Glucose, capillary     Status: Abnormal   Collection Time: 09/11/15 12:56 PM  Result Value Ref Range   Glucose-Capillary 157 (H) 65 - 99 mg/dL   Comment 1 Notify RN    Comment 2 Document in Chart   Glucose, capillary     Status: Abnormal   Collection Time: 09/11/15  3:13 PM  Result Value Ref Range   Glucose-Capillary 204 (H) 65 - 99 mg/dL  Glucose, capillary     Status: Abnormal   Collection Time: 09/11/15  6:16 PM  Result Value Ref Range   Glucose-Capillary 168 (H) 65 - 99 mg/dL   Comment 1 Notify RN    Comment 2 Document in Chart   Glucose, capillary     Status: Abnormal   Collection Time: 09/11/15  9:34 PM  Result Value Ref Range   Glucose-Capillary 143 (H) 65 - 99 mg/dL  Basic metabolic panel     Status: Abnormal   Collection Time: 09/12/15  4:48 AM  Result Value Ref Range   Sodium 137 135 - 145 mmol/L   Potassium 4.1 3.5 - 5.1 mmol/L   Chloride 102 101 - 111 mmol/L   CO2 26 22 - 32 mmol/L   Glucose,  Bld 188 (H) 65 - 99 mg/dL   BUN 82 (H) 6 - 20 mg/dL   Creatinine, Ser 2.97 (H) 0.61 - 1.24 mg/dL   Calcium 8.3 (L) 8.9 - 10.3 mg/dL   GFR calc non Af Amer 19 (L) >60 mL/min   GFR calc Af Amer 22 (L) >60 mL/min   Anion gap 9 5 - 15  Protime-INR     Status: Abnormal   Collection Time: 09/12/15  4:48 AM  Result Value Ref Range   Prothrombin Time 31.3 (H) 11.6 - 15.2 seconds   INR 3.10 (H) 0.00 - 1.49  CBC     Status: Abnormal   Collection Time: 09/12/15  4:48 AM  Result Value Ref Range   WBC 5.1 4.0 - 10.5 K/uL   RBC 3.55 (L) 4.22 - 5.81 MIL/uL   Hemoglobin 10.1 (L) 13.0 - 17.0 g/dL   HCT 31.8 (L) 39.0 - 52.0 %   MCV 89.6 78.0 - 100.0 fL   MCH 28.5 26.0 - 34.0 pg   MCHC 31.8 30.0 - 36.0 g/dL   RDW 15.7 (H) 11.5 - 15.5 %  Platelets 120 (L) 150 - 400 K/uL  Glucose, capillary     Status: Abnormal   Collection Time: 09/12/15  7:02 AM  Result Value Ref Range   Glucose-Capillary 141 (H) 65 - 99 mg/dL    Intake/Output Summary (Last 24 hours) at 09/12/15 0921 Last data filed at 09/12/15 0740  Gross per 24 hour  Intake    243 ml  Output   1400 ml  Net  -1157 ml    ASSESSMENT AND PLAN:  ACUTE ON CHRONIC DIASTOLIC HF:  EF 123456 (echo 09/10/15) - I/O incomplete.   Weight now decreasing.  Continue IV diuresis with lasix 120 mg BID.  HR in 50's will not allow BB. Add hydralazine.  CAD:   No ischemia    Hypertensive heart disease with heart failure:  BP remains elevated -  Add hydralazine 25 mg BID.  AS:  Moderate AS on echo.    CKD:    Creat improving with diuresis.   Pixie Casino, MD, [facility] Attending Cardiologist [facility] 09/12/2015 9:21 AM

## 2015-09-12 NOTE — Progress Notes (Signed)
[name] for Coumadin  Indication: atrial fibrillation  Allergies  Allergen Reactions  . Betadine [Povidone Iodine] Rash  . Iodinated Diagnostic Agents Rash and Other (See Comments)    Does fine with premedications for cath  . Latex Hives    Patient Measurements: Height: 5\' 10"  (177.8 cm) Weight: 198 lb 14.4 oz (90.22 kg) IBW/kg (Calculated) : 73  Vital Signs: Temp Source: Oral (03/19 0705) BP: 164/46 mmHg (03/19 1022) Pulse Rate: 50 (03/19 1022)  Labs:  Recent Labs  09/09/15 1326  09/09/15 1542 09/09/15 2140 09/10/15 0337 09/11/15 0527 09/12/15 0448  HGB 10.3*  --  10.8*  --   --   --  10.1*  HCT 32.6*  --  34.6*  --   --   --  31.8*  PLT 109*  --  120*  --   --   --  120*  APTT 54*  --   --   --   --   --   --   LABPROT  --   < > 37.5*  --  34.7* 30.7* 31.3*  INR  --   < > 3.93*  --  3.54* 3.01* 3.10*  CREATININE 3.34*  --  3.27*  --  3.29* 3.15* 2.97*  TROPONINI  --   --  0.03 0.03 0.04*  --   --   < > = values in this interval not displayed.  Estimated Creatinine Clearance: 22.8 mL/min (by C-G formula based on Cr of 2.97).    Assessment: 38 yom admitted 09/09/2015 with SOB. Patient on coumadin PTA for AFib. Pharmacy consulted to continue dosing inpatient.   INR slightly supratherapeutic at 3.10 after restarting at 2.5 mg warfarin.  Hgb 10.1, Plts 120.  No bleeding noted.   PTA coumadin dose = 5mg  on Sundays and Thursdays, and 2,5mg  on Mon, Tue, Wed, and Sat. (Last dose 3/15)   Goal of Therapy:  INR 2-3 Monitor platelets by anticoagulation protocol: Yes   Plan:  -Warfarin 1 mg x 1 -daily PT/INR, cbc q3days -Monitor for s/sx of bleeding   Bennye Alm, PharmD Pharmacy Resident [phone]

## 2015-09-13 ENCOUNTER — Inpatient Hospital Stay (HOSPITAL_COMMUNITY): Payer: Medicare Other

## 2015-09-13 LAB — IRON AND TIBC
Iron: 15 ug/dL — ABNORMAL LOW (ref 45–182)
SATURATION RATIOS: 6 % — AB (ref 17.9–39.5)
TIBC: 234 ug/dL — AB (ref 250–450)
UIBC: 219 ug/dL

## 2015-09-13 LAB — BASIC METABOLIC PANEL
ANION GAP: 13 (ref 5–15)
BUN: 84 mg/dL — ABNORMAL HIGH (ref 6–20)
CALCIUM: 8.5 mg/dL — AB (ref 8.9–10.3)
CO2: 25 mmol/L (ref 22–32)
CREATININE: 3.19 mg/dL — AB (ref 0.61–1.24)
Chloride: 98 mmol/L — ABNORMAL LOW (ref 101–111)
GFR calc Af Amer: 20 mL/min — ABNORMAL LOW (ref >60)
GFR, EST NON AFRICAN AMERICAN: 17 mL/min — AB (ref >60)
GLUCOSE: 128 mg/dL — AB (ref 65–99)
Potassium: 4.4 mmol/L (ref 3.5–5.1)
Sodium: 136 mmol/L (ref 135–145)

## 2015-09-13 LAB — GLUCOSE, CAPILLARY
GLUCOSE-CAPILLARY: 119 mg/dL — AB (ref 65–99)
GLUCOSE-CAPILLARY: 167 mg/dL — AB (ref 65–99)
GLUCOSE-CAPILLARY: 184 mg/dL — AB (ref 65–99)
Glucose-Capillary: 204 mg/dL — ABNORMAL HIGH (ref 65–99)
Glucose-Capillary: 137 mg/dL — ABNORMAL HIGH (ref 65–99)

## 2015-09-13 LAB — PROTIME-INR
INR: 2.95 — ABNORMAL HIGH (ref 0.00–1.49)
Prothrombin Time: 30.2 seconds — ABNORMAL HIGH (ref 11.6–15.2)

## 2015-09-13 LAB — FERRITIN: Ferritin: 153 ng/mL (ref 24–336)

## 2015-09-13 MED ORDER — LEVOTHYROXINE SODIUM 75 MCG PO TABS
175.0000 ug | ORAL_TABLET | Freq: Every day | ORAL | Status: DC
  Administered 2015-09-14 – 2015-09-29 (×14): 175 ug via ORAL
  Filled 2015-09-13 (×16): qty 1

## 2015-09-13 MED ORDER — FUROSEMIDE 10 MG/ML IJ SOLN
20.0000 mg/h | INTRAMUSCULAR | Status: DC
  Administered 2015-09-13: 15 mg/h via INTRAVENOUS
  Administered 2015-09-14: 20 mg/h via INTRAVENOUS
  Administered 2015-09-14: 15 mg/h via INTRAVENOUS
  Administered 2015-09-15 – 2015-09-18 (×6): 20 mg/h via INTRAVENOUS
  Filled 2015-09-13 (×19): qty 25

## 2015-09-13 MED ORDER — WARFARIN SODIUM 2 MG PO TABS
1.0000 mg | ORAL_TABLET | Freq: Once | ORAL | Status: DC
  Filled 2015-09-13: qty 0.5

## 2015-09-13 MED ORDER — WARFARIN SODIUM 2.5 MG PO TABS: 2.5000 mg | ORAL_TABLET | Freq: Once | ORAL | Status: DC

## 2015-09-13 MED ORDER — FUROSEMIDE 10 MG/ML IJ SOLN: 60.0000 mg | Freq: Two times a day (BID) | INTRAMUSCULAR | Status: DC

## 2015-09-13 MED ORDER — HYDRALAZINE HCL 25 MG PO TABS
37.5000 mg | ORAL_TABLET | Freq: Three times a day (TID) | ORAL | Status: DC
  Administered 2015-09-13 – 2015-09-14 (×3): 37.5 mg via ORAL
  Filled 2015-09-13 (×4): qty 2

## 2015-09-13 MED ORDER — HYDRALAZINE HCL 25 MG PO TABS: 37.5000 mg | ORAL_TABLET | Freq: Two times a day (BID) | ORAL | Status: DC

## 2015-09-13 NOTE — Progress Notes (Addendum)
[place] for Coumadin  Indication: atrial fibrillation  Allergies  Allergen Reactions  . Betadine [Povidone Iodine] Rash  . Iodinated Diagnostic Agents Rash and Other (See Comments)    Does fine with premedications for cath  . Latex Hives    Patient Measurements: Height: 5\' 10"  (177.8 cm) Weight: 199 lb 8 oz (90.493 kg) (Scale C) IBW/kg (Calculated) : 73  Vital Signs: Temp: 98.1 F (36.7 C) (03/20 0529) Temp Source: Oral (03/20 0529) BP: 173/53 mmHg (03/20 0953) Pulse Rate: 51 (03/20 0529)  Labs:  Recent Labs  09/11/15 0527 09/12/15 0448 09/13/15 0258  HGB  --  10.1*  --   HCT  --  31.8*  --   PLT  --  120*  --   LABPROT 30.7* 31.3* 30.2*  INR 3.01* 3.10* 2.95*  CREATININE 3.15* 2.97* 3.19*    Estimated Creatinine Clearance: 21.2 mL/min (by C-G formula based on Cr of 3.19).    Assessment: 61 yom admitted 09/09/2015 with SOB. Patient on coumadin PTA for AFib. Pharmacy consulted to continue dosing inpatient.   INR 2.95 now therapeutic. Hgb 10.1, Plts 120.  No s/sx bleeding noted.   PTA coumadin dose = 5mg  on Sundays and Thursdays, and 2.5mg  on Mon, Tue, Wed, and Sat. (Last dose 3/15)   Goal of Therapy:  INR 2-3 Monitor platelets by anticoagulation protocol: Yes   Plan:  -Warfarin 1mg  x 1 -daily PT/INR, cbc q3days -Monitor for s/sx of bleeding   Melburn Popper, PharmD Clinical Pharmacy Resident Pager: [phone] 09/13/2015 10:05 AM

## 2015-09-13 NOTE — Consult Note (Signed)
   [facility] CM Inpatient Consult   09/13/2015  DRAVIN SCHELLIN 19-Mar-1934 [id number] Patient screened for potential [facility] [place] Management services. Patient is eligible for [facility] Care Management services through his Medicare.   Currently, patient's disposition is not clear, ongoing IV Lasix noted in chart review. Patient admitted with HF exacerbation and appears weak.. Will follow up with inpatient care management team for any identified care management needs. Please place a [facility] Care Management consult, if needed at anytime. For questions please contact:   Natividad Brood, RN BSN [facility] Liaison  [phone] business mobile phone Toll free office [phone]

## 2015-09-13 NOTE — Progress Notes (Signed)
Advanced Heart Failure Rounding Note   Subjective:   Mr Burdette is a 82 year old with history of CAD- CABG, bradycardia (off bb) , CKD Stage IV, hypothyroidism, A fib DC-CV 2015 on amio, AVF. Most recent cath 2008 LAD, CFX 100%,  SVG-OM 90%, RCA 70%.    Admitted with volume overload. Prior to admit he lasix had been increased to lasix 80 mg po twice a day. Weight at home did not change 216 pounds. He has been diuresing with 120 mg IV lasix twice a day then cut back to 60 mg twice today. Creatinine on admit 3.29 and in July 2016 creatinine was 2.45. Sluggish urine output.   SOB with exertion. Cough. Denies CP.   09/10/2015: EF 60-65% MV severely calcified. Grade II DD . Moderate AS     Objective:   Weight Range:  Vital Signs:   Temp:  [98 F (36.7 C)-98.9 F (37.2 C)] 98.1 F (36.7 C) (03/20 0529) Pulse Rate:  [50-60] 51 (03/20 0529) Resp:  [18-22] 20 (03/20 0529) BP: (148-164)/(46-64) 163/53 mmHg (03/20 0529) SpO2:  [91 %-94 %] 92 % (03/20 0529) Weight:  [199 lb 8 oz (90.493 kg)] 199 lb 8 oz (90.493 kg) (03/20 0529) Last BM Date: 09/10/15  Weight change: Filed Weights   09/11/15 0500 09/12/15 0705 09/13/15 0529  Weight: 200 lb 1.6 oz (90.765 kg) 198 lb 14.4 oz (90.22 kg) 199 lb 8 oz (90.493 kg)    Intake/Output:   Intake/Output Summary (Last 24 hours) at 09/13/15 0917 Last data filed at 09/13/15 0631  Gross per 24 hour  Intake    960 ml  Output   1251 ml  Net   -291 ml     Physical Exam: General:  Chronically ill appearing. SOB with getting in bed. No resp difficulty HEENT: normal Neck: supple. JVP to ear  . Carotids 2+ bilat; no bruits. No lymphadenopathy or thryomegaly appreciated. Cor: PMI nondisplaced. Regular rate & rhythm. No rubs, gallops. AS .  Lungs: RML RLL LLL crackles on 2 liters oxygen. Abdomen: soft, nontender, nondistended. No hepatosplenomegaly. No bruits or masses. Good bowel sounds. Extremities: no cyanosis, clubbing, rash, R and LLE 2-3+  extends to thigh. R and LLE ted hose. LUE AVF  Neuro: alert & orientedx3, cranial nerves grossly intact. moves all 4 extremities w/o difficulty. Affect pleasant  Telemetry: Sinus Brady 50s   Labs: Basic Metabolic Panel:  Recent Labs Lab 09/09/15 1542 09/10/15 0337 09/11/15 0527 09/12/15 0448 09/13/15 0258  NA 135 138 138 137 136  K 4.3 4.2 4.0 4.1 4.4  CL 99* 101 103 102 98*  CO2 22 25 24 26 25   GLUCOSE 121* 80 108* 188* 128*  BUN 83* 83* 82* 82* 84*  CREATININE 3.27* 3.29* 3.15* 2.97* 3.19*  CALCIUM 8.6* 8.5* 8.6* 8.3* 8.5*  MG 2.5*  --   --   --   --     Liver Function Tests:  Recent Labs Lab 09/09/15 1326 09/09/15 1542  AST 63* 60*  ALT 63 67*  ALKPHOS 74 75  BILITOT 0.7 0.7  PROT 6.0* 6.3*  ALBUMIN 3.4* 3.6   No results for input(s): LIPASE, AMYLASE in the last 168 hours. No results for input(s): AMMONIA in the last 168 hours.  CBC:  Recent Labs Lab 09/09/15 1326 09/09/15 1542 09/12/15 0448  WBC 5.9 6.1 5.1  NEUTROABS  --  5.5  --   HGB 10.3* 10.8* 10.1*  HCT 32.6* 34.6* 31.8*  MCV 90.1 89.2 89.6  PLT 109* 120* 120*    Cardiac Enzymes:  Recent Labs Lab 09/09/15 1542 09/09/15 2140 09/10/15 0337  TROPONINI 0.03 0.03 0.04*    BNP: BNP (last 3 results)  Recent Labs  09/09/15 1326 09/09/15 1542  BNP 605.6* 729.6*    ProBNP (last 3 results) No results for input(s): PROBNP in the last 8760 hours.    Other results:  Imaging:  No results found.   Medications:     Scheduled Medications: . atorvastatin  80 mg Oral q1800  . cyanocobalamin  1,000 mcg Intramuscular Once  . doxazosin  8 mg Oral Daily  . furosemide  60 mg Intravenous BID  . hydrALAZINE  25 mg Oral BID  . insulin aspart  0-15 Units Subcutaneous TID WC  . insulin aspart  0-5 Units Subcutaneous QHS  . insulin glargine  15 Units Subcutaneous Daily  . isosorbide mononitrate  60 mg Oral Daily  . memantine  5 mg Oral Daily  . pantoprazole  40 mg Oral Q0600  .  potassium chloride  20 mEq Oral Daily  . sodium chloride flush  3 mL Intravenous Q12H  . Warfarin - Pharmacist Dosing Inpatient   Does not apply q1800     Infusions:     PRN Medications:  sodium chloride, acetaminophen, guaifenesin, ondansetron (ZOFRAN) IV, sodium chloride flush   Assessment/ Plan /Discussion.   1. A/C Diastolic Heart Failure- ECHO EF 60-65% Grade II DD. NYHA III-IV. Marked volume overload with poor response to intermittent IV lasix. Limited urine output. Place foley. Give 120 mg IV lasix now then start lasix drip at 15 mg per hour.  2. CKD Stage IV- Creatinine baseline 2.5. On admit creatinine 3.34>3.19. Followed in the community by Dr Lorrene Reid. Has LUE AVF. Will need nephrology to weigh in.  3. PAF- not on bb with history of bradycardia. On coumadin. INR followed by pharmacy 4. CAD- CABG. Most recent [place] 2008 . Myoview . On statin and coumadin. No BB with bradycardia. 5. AS - moderate on ECHO 6. MV- Severe calcification. Mild stenosis.  7. Hypothyroidism- TSH 1.5 . Was on synthroid prior to admit. Restart 175 mcg synthroid. 8. HTN - Elevated. Increase hydralazine 37.5 tid.   Length of Stay: 4  Amy Clegg NP-C  09/13/2015, 9:17 AM  Advanced Heart Failure Team Pager [phone] (M-F; [place])  Please contact [facility] for night-coverage after hours (4p -7a ) and weekends on [email or web address]  Patient seen with NP, agree with the above note. He remains markedly volume overloaded and short of breath.  Diuresis has been limited by poor renal function.  1. Acute on chronic diastolic CHF: EF [id number], moderate diastolic dysfunction.  Marked volume overload still.  - Start Lasix gtt 15 mg/hr hour today.  2. CKD: Stage IV.  Follow closely with diuresis, this may be a limiting factor.  If we are unable to effectively diurese her, may end up needing HD sooner rather than later.  3. Atrial fibrillation: Paroxysmal, in NSR.  No beta blocker with bradycardia.  Continue warfarin.  4.  CAD: s/p CABG, stable.  Continue statin.  5. AS: Moderate on echo.  6. Mitral stenosis: Moderate on echo.  7. HTN: Increase hydralazine.  8. Hypothyroidism: restart Synthroid.    Loralie Champagne 09/13/2015 1:30 PM

## 2015-09-13 NOTE — Consult Note (Addendum)
Reason for Consult: Acute renal failure on chronic kidney disease stage IV with volume overload Referring Physician: Loralie Champagne M.D. (cardiology)   HPI:  82 year old Caucasian man with past medical history significant for chronic kidney disease stage IV (baseline creatinine 2.1-2.4) secondary to diabetes and hypertension. He also has a history of peripheral vascular disease (infrainguinal aorto occlusive disease, carotid disease), dyslipidemia and chronic atrial fibrillation on Coumadin and diastolic heart failure. He has a left brachiocephalic fistula placed in 2014 however, on recent follow-up with Dr. Lorrene Reid, a question was raised regarding this patient's dialysis candidacy given his advancing dementia and changes in mentation.  He was admitted to the hospital 5 days ago with volume overload/exacerbation of diastolic heart failure with an unimpressive response to bolus dose diuretic therapy since admission. This morning he was converted over to a furosemide drip at 15 mg/hour. Mr. Lattin himself is unable to voice any complaints and is able to answer closed ended questions.  Past Medical History  Diagnosis Date  . Hypertension   . Hyperlipidemia   . Hypothyroidism   . CAD (coronary artery disease)     a. prior CABG in 1997 with redo in 2000. b. last cath in 2008 - managed medically  . Type 2 diabetes mellitus ([place])   . Generalized weakness     without overt findings  . Peripheral vascular disease ([place])   . Carotid disease, bilateral ([place])     with multiple bilateral carotid surgeries  . Psoriasis   . Meniere's disease   . Degenerative joint disease   . Gout   . Orthopnea     Two-pillow  . CKD (chronic kidney disease), stage IV ([place])     a. Has fistula in place.   . Chronic diastolic CHF (congestive heart failure) ([place])   . GERD (gastroesophageal reflux disease)   . Atrial fibrillation ([place]) Aug. 2015    a. Dx 01/2014, s/p DCCV 03/06/14.  . Sinus bradycardia     a. Toprol D/C'd  due to this.   . Shingles   . On home oxygen therapy     "2L" qhs (09/09/2015    Past Surgical History  Procedure Laterality Date  . Coronary artery bypass graft  1997;  2002  . Carotid endarterectomy Bilateral "several times"    "5 on right; 2-3 on left" (09/09/2015)  . Lung removal, partial    . Lumbar laminectomy  July 2001  . Thoracotomy Left     due to fungal infection  . Shoulder surgery Bilateral   . Cardiac catheterization    . Appendectomy    . Av fistula placement Left 01/21/2013    Procedure: ARTERIOVENOUS (AV) FISTULA CREATION, Left Brachiocephalic;  Surgeon: Angelia Mould, MD;  Location: [facility];  Service: Vascular;  Laterality: Left;  . Cardiac catheterization  2008    L main irreg, LAD 80%, IMA-LAD & SVG-Diag patent, CFX 100%, SVG-OM 90%, RCA 70%, SVG-RCA OK, EF nl, med rx, no vessels appropriate for PCI  . Tee without cardioversion N/A 03/06/2014    Procedure: TRANSESOPHAGEAL ECHOCARDIOGRAM (TEE);  Surgeon: Larey Dresser, MD;  Location: [facility];  Service: Cardiovascular;  Laterality: N/A;  . Cardioversion N/A 03/06/2014    Procedure: CARDIOVERSION;  Surgeon: Larey Dresser, MD;  Location: [facility] ENDOSCOPY;  Service: Cardiovascular;  Laterality: N/A;  . Blepharoplasty Bilateral     Family History  Problem Relation Age of Onset  . Heart attack Father   . Heart disease Father     After 46  yrs of age  . Hyperlipidemia Father   . Hypertension Father   . Diabetes Mother   . Hypertension Mother   . Heart disease Mother   . Heart disease Brother   . Diabetes Brother     Social History:  reports that he has never smoked. He has never used smokeless tobacco. He reports that he does not drink alcohol or use illicit drugs.  Allergies:  Allergies  Allergen Reactions  . Betadine [Povidone Iodine] Rash  . Iodinated Diagnostic Agents Rash and Other (See Comments)    Does fine with premedications for cath  . Latex Hives    Medications:  Scheduled: .  atorvastatin  80 mg Oral q1800  . cyanocobalamin  1,000 mcg Intramuscular Once  . doxazosin  8 mg Oral Daily  . hydrALAZINE  37.5 mg Oral 3 times per day  . insulin aspart  0-15 Units Subcutaneous TID WC  . insulin aspart  0-5 Units Subcutaneous QHS  . insulin glargine  15 Units Subcutaneous Daily  . isosorbide mononitrate  60 mg Oral Daily  . [START ON 09/14/2015] levothyroxine  175 mcg Oral QAC breakfast  . memantine  5 mg Oral Daily  . pantoprazole  40 mg Oral Q0600  . potassium chloride  20 mEq Oral Daily  . sodium chloride flush  3 mL Intravenous Q12H  . warfarin  1 mg Oral ONCE-1800  . Warfarin - Pharmacist Dosing Inpatient   Does not apply q1800    BMP Latest Ref Rng 09/13/2015 09/12/2015 09/11/2015  Glucose 65 - 99 mg/dL 128(H) 188(H) 108(H)  BUN 6 - 20 mg/dL 84(H) 82(H) 82(H)  Creatinine 0.61 - 1.24 mg/dL 3.19(H) 2.97(H) 3.15(H)  Sodium 135 - 145 mmol/L 136 137 138  Potassium 3.5 - 5.1 mmol/L 4.4 4.1 4.0  Chloride 101 - 111 mmol/L 98(L) 102 103  CO2 22 - 32 mmol/L 25 26 24   Calcium 8.9 - 10.3 mg/dL 8.5(L) 8.3(L) 8.6(L)   CBC Latest Ref Rng 09/12/2015 09/09/2015 09/09/2015  WBC 4.0 - 10.5 K/uL 5.1 6.1 5.9  Hemoglobin 13.0 - 17.0 g/dL 10.1(L) 10.8(L) 10.3(L)  Hematocrit 39.0 - 52.0 % 31.8(L) 34.6(L) 32.6(L)  Platelets 150 - 400 K/uL 120(L) 120(L) 109(L)     No results found.  Review of Systems  Constitutional: Positive for malaise/fatigue. Negative for fever and chills.  HENT: Negative.   Eyes: Negative.   Respiratory: Positive for shortness of breath. Negative for hemoptysis and sputum production.   Cardiovascular: Positive for leg swelling.  Gastrointestinal: Negative.   Genitourinary: Negative.   Musculoskeletal: Negative.   Skin: Negative.   Neurological: Positive for weakness.   Blood pressure 163/57, pulse 53, temperature 97.9 F (36.6 C), temperature source Oral, resp. rate 16, height 5\' 10"  (1.778 m), weight 90.493 kg (199 lb 8 oz), SpO2 92 %. Physical Exam   Nursing note and vitals reviewed. Constitutional: He appears well-developed and well-nourished.  HENT:  Head: Normocephalic and atraumatic.  Mouth/Throat: No oropharyngeal exudate.  Eyes: Pupils are equal, round, and reactive to light.  Neck: Normal range of motion. JVD present. No tracheal deviation present.  7-8 cm JVP  Cardiovascular: Regular rhythm and normal heart sounds.  Exam reveals no friction rub.   Bradycardia  Respiratory: Effort normal. He has no wheezes. He has no rales.  Course/transmitted breath sounds bilaterally  GI: Soft. He exhibits distension. There is no tenderness. There is no rebound.  Musculoskeletal: He exhibits edema.  2-3+ lower extremity edema Left brachiocephalic fistula with palpable thrill and  audible bruit  Neurological:  Somnolent but awakens and calling out his name. Disoriented to time and place.  Skin: Skin is warm and dry. No rash noted. No erythema.    Assessment/Plan: 1. Acute renal failure on chronic kidney disease stage IV: Creatinine slightly higher than previous baseline which is most likely from the effect of CHF exacerbation versus RAS activation from ongoing diuretic therapy. Unfortunately, he has diastolic heart failure that is much more difficult to manage with regards to volume unloading without compromising renal function. With limited renal reserve, this becomes even more challenging. I agree with trial of Lasix drip and I will discuss the utility of dialysis (chronic versus short-term trial) with his son-his wife who is currently in the room chooses to defer decision-making to his son at this time. 2. Acute exacerbation of diastolic heart failure: Unimpressive diuretic response so far with bolus doses of furosemide-converted over to intravenous furosemide drip to see if this is more efficacious.  3. Anemia of chronic kidney disease: We'll check iron studies and decide on need for supplementation versus ESA. 4. Hypertension: On  vasodilator antihypertensive therapy with Doxazosin, hydralazine and Imdur. Unable to use beta blockers or non-dihydropyridine calcium channel blockers because of bradycardia. Not on RAS blockers because of advanced renal dysfunction. Monitor for diuretic therapy. 5. Paroxysmal atrial fibrillation: Currently on warfarin for CVA prophylaxis. He is in sinus rhythm at this time and cannot tolerate beta blockers due to bradycardia.  Sheilia Reznick K. 09/13/2015, 2:14 PM     Interim update: I spoke at length with the patient's son and wife regarding the timing/utility of dialysis and they would like to give hemodialysis a try to see how he tolerates it or not before making any decisions about stopping therapy/palliative care. Their biggest questions to me were whether he would be able to tolerate hemodialysis as an outpatient (we discussed the possibility of him needing a skilled nursing facility or going home with OT/PT in order to get stronger) and also whether dialysis would be improve his appetite (possibly so if his current decreased appetite is from uremia). If he fails diuretic therapy over the next 24-36 hours-would plan to start dialysis.  Elmarie Shiley MD [facility]. Office # [phone] Pager # [phone] 4:48 PM

## 2015-09-13 NOTE — Care Management Important Message (Signed)
Important Message  Patient Details  Name: RAMEEN ALPERS MRN: [id number] Date of Birth: Jun 06, 1934   Medicare Important Message Given:  Yes    Pernell Dikes P Elnita Surprenant 09/13/2015, 3:36 PM

## 2015-09-14 LAB — BASIC METABOLIC PANEL
ANION GAP: 10 (ref 5–15)
BUN: 84 mg/dL — ABNORMAL HIGH (ref 6–20)
CALCIUM: 8.5 mg/dL — AB (ref 8.9–10.3)
CO2: 25 mmol/L (ref 22–32)
Chloride: 102 mmol/L (ref 101–111)
Creatinine, Ser: 3.03 mg/dL — ABNORMAL HIGH (ref 0.61–1.24)
GFR calc non Af Amer: 18 mL/min — ABNORMAL LOW (ref >60)
GFR, EST AFRICAN AMERICAN: 21 mL/min — AB (ref >60)
Glucose, Bld: 136 mg/dL — ABNORMAL HIGH (ref 65–99)
Potassium: 4.1 mmol/L (ref 3.5–5.1)
Sodium: 137 mmol/L (ref 135–145)

## 2015-09-14 LAB — GLUCOSE, CAPILLARY
GLUCOSE-CAPILLARY: 127 mg/dL — AB (ref 65–99)
GLUCOSE-CAPILLARY: 170 mg/dL — AB (ref 65–99)
Glucose-Capillary: 193 mg/dL — ABNORMAL HIGH (ref 65–99)
Glucose-Capillary: 163 mg/dL — ABNORMAL HIGH (ref 65–99)

## 2015-09-14 LAB — PROTIME-INR
INR: 2.72 — ABNORMAL HIGH (ref 0.00–1.49)
PROTHROMBIN TIME: 28.4 s — AB (ref 11.6–15.2)

## 2015-09-14 MED ORDER — SODIUM CHLORIDE 0.9 % IV SOLN
510.0000 mg | INTRAVENOUS | Status: AC
  Administered 2015-09-14 – 2015-09-21 (×2): 510 mg via INTRAVENOUS
  Filled 2015-09-14 (×2): qty 17

## 2015-09-14 MED ORDER — POTASSIUM CHLORIDE CRYS ER 20 MEQ PO TBCR
40.0000 meq | EXTENDED_RELEASE_TABLET | Freq: Every day | ORAL | Status: DC
  Administered 2015-09-14 – 2015-09-18 (×5): 40 meq via ORAL
  Filled 2015-09-14 (×7): qty 2

## 2015-09-14 MED ORDER — METOLAZONE 5 MG PO TABS
5.0000 mg | ORAL_TABLET | Freq: Two times a day (BID) | ORAL | Status: DC
  Administered 2015-09-14 – 2015-09-17 (×8): 5 mg via ORAL
  Filled 2015-09-14 (×8): qty 1
  Filled 2015-09-14: qty 2

## 2015-09-14 MED ORDER — HYDRALAZINE HCL 50 MG PO TABS
50.0000 mg | ORAL_TABLET | Freq: Three times a day (TID) | ORAL | Status: DC
Start: 2015-09-14 — End: 2015-09-15
  Administered 2015-09-14 – 2015-09-15 (×3): 50 mg via ORAL
  Filled 2015-09-14 (×3): qty 1

## 2015-09-14 MED ORDER — WARFARIN SODIUM 2.5 MG PO TABS
2.5000 mg | ORAL_TABLET | Freq: Once | ORAL | Status: AC
  Administered 2015-09-14: 2.5 mg via ORAL
  Filled 2015-09-14: qty 1

## 2015-09-14 NOTE — Progress Notes (Addendum)
[facility] for Coumadin  Indication: atrial fibrillation  Allergies  Allergen Reactions  . Betadine [Povidone Iodine] Rash  . Iodinated Diagnostic Agents Rash and Other (See Comments)    Does fine with premedications for cath  . Latex Hives    Patient Measurements: Height: 5\' 10"  (177.8 cm) Weight: 199 lb 1.6 oz (90.311 kg) (scale c) IBW/kg (Calculated) : 73  Vital Signs: Temp: 98.3 F (36.8 C) (03/21 0617) Temp Source: Oral (03/21 0617) BP: 153/48 mmHg (03/21 0617) Pulse Rate: 58 (03/21 0617)  Labs:  Recent Labs  09/12/15 0448 09/13/15 0258 09/14/15 0343  HGB 10.1*  --   --   HCT 31.8*  --   --   PLT 120*  --   --   LABPROT 31.3* 30.2* 28.4*  INR 3.10* 2.95* 2.72*  CREATININE 2.97* 3.19* 3.03*    Estimated Creatinine Clearance: 22.3 mL/min (by C-G formula based on Cr of 3.03).    Assessment: 33 yom admitted 09/09/2015 with SOB. Patient on coumadin PTA for AFib. Pharmacy consulted to continue dosing inpatient.   INR 2.72 therapeutic. Hgb 10.1, Plts 120.  No s/sx bleeding noted. Patient missed evening dose of coumadin.  PTA coumadin dose = 5mg  on Sundays and Thursdays, and 2.5mg  on Mon, Tue, Wed, and Sat. (Last dose 3/15)   Goal of Therapy:  INR 2-3 Monitor platelets by anticoagulation protocol: Yes   Plan:  -Warfarin 2.5mg  x 1 -daily PT/INR, cbc q3days -Monitor for s/sx of bleeding   Melburn Popper, PharmD Clinical Pharmacy Resident Pager: [phone] 09/14/2015 8:03 AM

## 2015-09-14 NOTE — Progress Notes (Signed)
[facility] Progress Note   Assessment/ Plan:   1. Acute renal failure on chronic kidney disease stage IV: With acute on chronic renal failure likely from CHF exacerbation-marginal response to furosemide drip overnight and metolazone added today.  We will assess his response over the next 24 hours and make a decision regarding dialysis thereafter. The question is whether he would be able to tolerate chronic hemodialysis with his advancing dementia from a safety/quality of life standpoint. He may need transient and inpatient PT/OT +/- SNF. 2. Acute exacerbation of diastolic heart failure: 1.7 L urine output with intermittently interrupted Lasix drip-metolazone started today and will assess urine output/response. Plan to start hemodialysis tomorrow if unresponsive to Lasix/metolazone combination.  3. Anemia of chronic kidney disease: With low iron saturation, start intravenous pharynx. 4. Hypertension: On vasodilator antihypertensive therapy with Doxazosin, hydralazine and Imdur. Unable to use beta blockers or non-dihydropyridine calcium channel blockers because of bradycardia. Not on RAS blockers because of advanced renal dysfunction. Monitor for diuretic therapy. 5. Paroxysmal atrial fibrillation: Currently on warfarin for CVA prophylaxis. He is in sinus rhythm at this time and cannot tolerate beta blockers due to bradycardia.  Subjective:   No acute events overnight-had some problems with Lasix drip due to machine/lines malfunction.    Objective:   BP 153/48 mmHg  Pulse 58  Temp(Src) 98.3 F (36.8 C) (Oral)  Resp 16  Ht 5\' 10"  (1.778 m)  Wt 90.311 kg (199 lb 1.6 oz)  BMI 28.57 kg/m2  SpO2 93%  Intake/Output Summary (Last 24 hours) at 09/14/15 1016 Last data filed at 09/14/15 0645  Gross per 24 hour  Intake    780 ml  Output   1750 ml  Net   -970 ml   Weight change: 0.091 kg (3.2 oz)  Physical Exam: [id number] sitting up in a recliner CVS: Pulse regular  bradycardia, S1 and S2 normal Resp: Decreased inspiratory effort with fine rales left base Abd: Soft, obese, nontender Ext: 2-3+ lower extremity edema.  Imaging: Dg Chest Port 1 View  09/13/2015  CLINICAL DATA:  82 year old male with history of dyspnea. EXAM: PORTABLE CHEST 1 VIEW COMPARISON:  Chest x-ray 09/10/2015. FINDINGS: There is cephalization of the pulmonary vasculature, indistinctness of the interstitial markings, and patchy airspace disease throughout the lungs bilaterally suggestive of moderate pulmonary edema. Small bilateral pleural effusions. Lung volumes are low. No pneumothorax. Mild cardiomegaly. Upper mediastinal contours are within normal limits. Atherosclerosis in the thoracic aorta. Status post median sternotomy for CABG. IMPRESSION: 1. The appearance of the chest suggests worsening congestive heart failure, as above. 2. Atherosclerosis. Electronically Signed   By: Vinnie Langton M.D.   On: 09/13/2015 16:17    Labs: BMET  Recent Labs Lab 09/09/15 1326 09/09/15 1542 09/10/15 0337 09/11/15 0527 09/12/15 0448 09/13/15 0258 09/14/15 0343  NA 136 135 138 138 137 136 137  K 4.5 4.3 4.2 4.0 4.1 4.4 4.1  CL 100* 99* 101 103 102 98* 102  CO2 21* 22 25 24 26 25 25   GLUCOSE 125* 121* 80 108* 188* 128* 136*  BUN 84* 83* 83* 82* 82* 84* 84*  CREATININE 3.34* 3.27* 3.29* 3.15* 2.97* 3.19* 3.03*  CALCIUM 8.7* 8.6* 8.5* 8.6* 8.3* 8.5* 8.5*   CBC  Recent Labs Lab 09/09/15 1326 09/09/15 1542 09/12/15 0448  WBC 5.9 6.1 5.1  NEUTROABS  --  5.5  --   HGB 10.3* 10.8* 10.1*  HCT 32.6* 34.6* 31.8*  MCV 90.1 89.2 89.6  PLT 109* 120* 120*  Medications:    . atorvastatin  80 mg Oral q1800  . cyanocobalamin  1,000 mcg Intramuscular Once  . doxazosin  8 mg Oral Daily  . ferumoxytol  510 mg Intravenous Weekly  . hydrALAZINE  50 mg Oral 3 times per day  . insulin aspart  0-15 Units Subcutaneous TID WC  . insulin aspart  0-5 Units Subcutaneous QHS  . insulin glargine   15 Units Subcutaneous Daily  . isosorbide mononitrate  60 mg Oral Daily  . levothyroxine  175 mcg Oral QAC breakfast  . memantine  5 mg Oral Daily  . metolazone  5 mg Oral BID  . pantoprazole  40 mg Oral Q0600  . potassium chloride  40 mEq Oral Daily  . sodium chloride flush  3 mL Intravenous Q12H  . warfarin  2.5 mg Oral ONCE-1800  . Warfarin - Pharmacist Dosing Inpatient   Does not apply [id number]   Elmarie Shiley, MD 09/14/2015, 10:16 AM

## 2015-09-14 NOTE — Progress Notes (Addendum)
Advanced Heart Failure Rounding Note   Subjective:   Yesterday he was switched to lasix drip. Urine output a little better but remains SOB.   Creatinine  3.1>3.03 BUN 84>84     09/10/2015: EF 60-65% MV severely calcified. Grade II DD . Moderate AS     Objective:   Weight Range:  Vital Signs:   Temp:  [97.6 F (36.4 C)-98.3 F (36.8 C)] 98.3 F (36.8 C) (03/21 0617) Pulse Rate:  [53-58] 58 (03/21 0617) Resp:  [16] 16 (03/21 0617) BP: (151-173)/(48-58) 153/48 mmHg (03/21 0617) SpO2:  [92 %-93 %] 93 % (03/21 0617) Weight:  [199 lb 1.6 oz (90.311 kg)] 199 lb 1.6 oz (90.311 kg) (03/21 0617) Last BM Date: 09/10/15  Weight change: Filed Weights   09/12/15 0705 09/13/15 0529 09/14/15 0617  Weight: 198 lb 14.4 oz (90.22 kg) 199 lb 8 oz (90.493 kg) 199 lb 1.6 oz (90.311 kg)    Intake/Output:   Intake/Output Summary (Last 24 hours) at 09/14/15 0803 Last data filed at 09/14/15 0645  Gross per 24 hour  Intake   1140 ml  Output   1750 ml  Net   -610 ml     Physical Exam: General:  Chronically ill appearing. Mild dyspnea at rest.  HEENT: normal Neck: supple. JVP to ear.  Carotids 2+ bilat; no bruits. No lymphadenopathy or thryomegaly appreciated. Cor: PMI nondisplaced. Regular rate & rhythm. No rubs, gallops. AS .  Lungs: RML RLL LLL crackles on 2 liters oxygen. On 2 liters [place]  Abdomen: soft, nontender, nondistended. No hepatosplenomegaly. No bruits or masses. Good bowel sounds. Extremities: no cyanosis, clubbing, rash, R and LLE 2-3+ extends to thigh. R and LLE ted hose. LUE AVF  Neuro: alert & orientedx3, cranial nerves grossly intact. moves all 4 extremities w/o difficulty. Affect pleasant  Telemetry: Sinus Brady 50s   Labs: Basic Metabolic Panel:  Recent Labs Lab 09/09/15 1542 09/10/15 0337 09/11/15 0527 09/12/15 0448 09/13/15 0258 09/14/15 0343  NA 135 138 138 137 136 137  K 4.3 4.2 4.0 4.1 4.4 4.1  CL 99* 101 103 102 98* 102  CO2 22 25 24 26 25 25     GLUCOSE 121* 80 108* 188* 128* 136*  BUN 83* 83* 82* 82* 84* 84*  CREATININE 3.27* 3.29* 3.15* 2.97* 3.19* 3.03*  CALCIUM 8.6* 8.5* 8.6* 8.3* 8.5* 8.5*  MG 2.5*  --   --   --   --   --     Liver Function Tests:  Recent Labs Lab 09/09/15 1326 09/09/15 1542  AST 63* 60*  ALT 63 67*  ALKPHOS 74 75  BILITOT 0.7 0.7  PROT 6.0* 6.3*  ALBUMIN 3.4* 3.6   No results for input(s): LIPASE, AMYLASE in the last 168 hours. No results for input(s): AMMONIA in the last 168 hours.  CBC:  Recent Labs Lab 09/09/15 1326 09/09/15 1542 09/12/15 0448  WBC 5.9 6.1 5.1  NEUTROABS  --  5.5  --   HGB 10.3* 10.8* 10.1*  HCT 32.6* 34.6* 31.8*  MCV 90.1 89.2 89.6  PLT 109* 120* 120*    Cardiac Enzymes:  Recent Labs Lab 09/09/15 1542 09/09/15 2140 09/10/15 0337  TROPONINI 0.03 0.03 0.04*    BNP: BNP (last 3 results)  Recent Labs  09/09/15 1326 09/09/15 1542  BNP 605.6* 729.6*    ProBNP (last 3 results) No results for input(s): PROBNP in the last 8760 hours.    Other results:  Imaging: Dg Chest [facility] 1 [address]  09/13/2015  CLINICAL DATA:  82 year old male with history of dyspnea. EXAM: PORTABLE CHEST 1 VIEW COMPARISON:  Chest x-ray 09/10/2015. FINDINGS: There is cephalization of the pulmonary vasculature, indistinctness of the interstitial markings, and patchy airspace disease throughout the lungs bilaterally suggestive of moderate pulmonary edema. Small bilateral pleural effusions. Lung volumes are low. No pneumothorax. Mild cardiomegaly. Upper mediastinal contours are within normal limits. Atherosclerosis in the thoracic aorta. Status post median sternotomy for CABG. IMPRESSION: 1. The appearance of the chest suggests worsening congestive heart failure, as above. 2. Atherosclerosis. Electronically Signed   By: Vinnie Langton M.D.   On: 09/13/2015 16:17     Medications:     Scheduled Medications: . atorvastatin  80 mg Oral q1800  . cyanocobalamin  1,000 mcg Intramuscular  Once  . doxazosin  8 mg Oral Daily  . hydrALAZINE  37.5 mg Oral 3 times per day  . insulin aspart  0-15 Units Subcutaneous TID WC  . insulin aspart  0-5 Units Subcutaneous QHS  . insulin glargine  15 Units Subcutaneous Daily  . isosorbide mononitrate  60 mg Oral Daily  . levothyroxine  175 mcg Oral QAC breakfast  . memantine  5 mg Oral Daily  . pantoprazole  40 mg Oral Q0600  . potassium chloride  20 mEq Oral Daily  . sodium chloride flush  3 mL Intravenous Q12H  . warfarin  1 mg Oral ONCE-1800  . Warfarin - Pharmacist Dosing Inpatient   Does not apply q1800    Infusions: . furosemide (LASIX) infusion 15 mg/hr (09/14/15 0642)    PRN Medications: sodium chloride, acetaminophen, guaifenesin, ondansetron (ZOFRAN) IV, sodium chloride flush   Assessment/ Plan /Discussion.   1. A/C Diastolic Heart Failure- ECHO EF 60-65% Grade II DD. NYHA III-IV. Marked volume overload with poor response to intermittent IV lasix. Yesterday he was started on lasix drip. Increased urine output. Creatinine down a little. Remains dyspneic.   2. CKD Stage IV- Creatinine baseline 2.5. On admit creatinine 3.34>3.19>3.03. Nephrology recommendations appreciated. Considered HD 3. PAF- not on bb with history of bradycardia. On coumadin. INR followed by pharmacy. INR 2.72  4. CAD- CABG. Most recent [facility] 2008 . Myoview . On statin and coumadin. No BB with bradycardia. 5. AS - moderate on ECHO 6. MV- Severe calcification. Mild stenosis.  7. Hypothyroidism- TSH 1.5 . Was on synthroid prior to admit. Restarted 175 mcg synthroid. 8. HTN - Elevated. Increase hydralazine 50 tid.  9. Anemia- Iron stores low. Give Iron today.  10. Dementia  Length of Stay: [place] NP-C  09/14/2015, 8:03 AM  Advanced Heart Failure Team Pager [phone] (M-F; 7a - 4p)  Please contact [facility] for night-coverage after hours (4p -7a ) and weekends on [email or web address]  Patient seen with NP, agree with the above note. He remains  markedly volume overloaded and short of breath. Diuresis has been limited by poor renal function.  1. Acute on chronic diastolic CHF: EF 123456, moderate diastolic dysfunction. Marked volume overload still. He did not diurese particularly well on Lasix gtt yesterday.  He remains visibly short of breath and CXR 3/20 showed pulmonary edema.  - Increase Lasix to 20 mg/hr and add metolazone 5 mg bid.  - If the above does not yield good results, will need HD for volume removal.  2. CKD: Stage IV. Follow closely with diuresis, this appears to be a limiting factor to volume management.  If no response to increased diuretics today, I think he will need HD. 3.  Atrial fibrillation: Paroxysmal, in NSR. No beta blocker with bradycardia. Continue warfarin.  4. CAD: s/p CABG, stable. Continue statin.  5. AS: Moderate on echo.  6. Mitral stenosis: Mild on echo.   Loralie Champagne 09/14/2015 8:26 AM

## 2015-09-15 DIAGNOSIS — I482 Chronic atrial fibrillation: Secondary | ICD-10-CM

## 2015-09-15 LAB — BASIC METABOLIC PANEL
Anion gap: 8 (ref 5–15)
BUN: 82 mg/dL — AB (ref 6–20)
CHLORIDE: 99 mmol/L — AB (ref 101–111)
CO2: 26 mmol/L (ref 22–32)
CREATININE: 3.01 mg/dL — AB (ref 0.61–1.24)
Calcium: 8.4 mg/dL — ABNORMAL LOW (ref 8.9–10.3)
GFR calc Af Amer: 21 mL/min — ABNORMAL LOW (ref >60)
GFR, EST NON AFRICAN AMERICAN: 18 mL/min — AB (ref >60)
GLUCOSE: 170 mg/dL — AB (ref 65–99)
Potassium: 3.8 mmol/L (ref 3.5–5.1)
SODIUM: 133 mmol/L — AB (ref 135–145)

## 2015-09-15 LAB — CBC
HCT: 34.1 % — ABNORMAL LOW (ref 39.0–52.0)
Hemoglobin: 10.4 g/dL — ABNORMAL LOW (ref 13.0–17.0)
MCH: 27 pg (ref 26.0–34.0)
MCHC: 30.5 g/dL (ref 30.0–36.0)
MCV: 88.6 fL (ref 78.0–100.0)
PLATELETS: 120 10*3/uL — AB (ref 150–400)
RBC: 3.85 MIL/uL — ABNORMAL LOW (ref 4.22–5.81)
RDW: 15.8 % — ABNORMAL HIGH (ref 11.5–15.5)
WBC: 7.1 10*3/uL (ref 4.0–10.5)

## 2015-09-15 LAB — GLUCOSE, CAPILLARY
GLUCOSE-CAPILLARY: 148 mg/dL — AB (ref 65–99)
Glucose-Capillary: 189 mg/dL — ABNORMAL HIGH (ref 65–99)
Glucose-Capillary: 198 mg/dL — ABNORMAL HIGH (ref 65–99)
Glucose-Capillary: 172 mg/dL — ABNORMAL HIGH (ref 65–99)

## 2015-09-15 LAB — PROTIME-INR
INR: 2.42 — ABNORMAL HIGH (ref 0.00–1.49)
PROTHROMBIN TIME: 26 s — AB (ref 11.6–15.2)

## 2015-09-15 MED ORDER — HYDRALAZINE HCL 50 MG PO TABS
75.0000 mg | ORAL_TABLET | Freq: Three times a day (TID) | ORAL | Status: DC
  Administered 2015-09-15 – 2015-09-19 (×12): 75 mg via ORAL
  Filled 2015-09-15 (×16): qty 1

## 2015-09-15 MED ORDER — WARFARIN SODIUM 2.5 MG PO TABS
2.5000 mg | ORAL_TABLET | Freq: Once | ORAL | Status: AC
  Administered 2015-09-15: 2.5 mg via ORAL
  Filled 2015-09-15: qty 1

## 2015-09-15 MED ORDER — POTASSIUM CHLORIDE CRYS ER 20 MEQ PO TBCR
20.0000 meq | EXTENDED_RELEASE_TABLET | Freq: Once | ORAL | Status: AC
Start: 2015-09-15 — End: 2015-09-15
  Administered 2015-09-15: 20 meq via ORAL
  Filled 2015-09-15: qty 1

## 2015-09-15 NOTE — Progress Notes (Signed)
Patient ID: ASIF HASEK, male   DOB: 09-14-1933, 82 y.o.   MRN: [id number]    Advanced Heart Failure Rounding Note   Subjective:    Yesterday he got Lasix gtt + metolazone 5 bid. He diuresed well, weight down 5 lbs.   Creatinine  3.1>3.03>3.01  09/10/2015: EF 60-65% MV severely calcified. Grade II DD . Moderate AS     Objective:   Weight Range:  Vital Signs:   Temp:  [97.9 F (36.6 C)-99.4 F (37.4 C)] 99.4 F (37.4 C) (03/22 0456) Pulse Rate:  [56-60] 60 (03/22 0456) Resp:  [18] 18 (03/22 0456) BP: (152-166)/(50-57) 152/52 mmHg (03/22 0456) SpO2:  [92 %-94 %] 92 % (03/22 0456) Weight:  [194 lb 12.8 oz (88.361 kg)] 194 lb 12.8 oz (88.361 kg) (03/22 0456) Last BM Date: 09/14/15  Weight change: Filed Weights   09/13/15 0529 09/14/15 0617 09/15/15 0456  Weight: 199 lb 8 oz (90.493 kg) 199 lb 1.6 oz (90.311 kg) 194 lb 12.8 oz (88.361 kg)    Intake/Output:   Intake/Output Summary (Last 24 hours) at 09/15/15 0855 Last data filed at 09/15/15 0852  Gross per 24 hour  Intake    720 ml  Output   3525 ml  Net  -2805 ml     Physical Exam: General:  Chronically ill appearing. Mild dyspnea at rest.  HEENT: normal Neck: supple. JVP to ear.  Carotids 2+ bilat; no bruits. No lymphadenopathy or thryomegaly appreciated. Cor: PMI nondisplaced. Regular rate & rhythm. No rubs, gallops. AS .  Lungs: RML RLL LLL crackles on 2 liters oxygen. On 2 liters [place]  Abdomen: soft, nontender, nondistended. No hepatosplenomegaly. No bruits or masses. Good bowel sounds. Extremities: no cyanosis, clubbing, rash.  2+ edema to knees bilaterally. LUE AVF  Neuro: alert & orientedx3, cranial nerves grossly intact. moves all 4 extremities w/o difficulty. Affect pleasant  Telemetry: Sinus Brady 50s   Labs: Basic Metabolic Panel:  Recent Labs Lab 09/09/15 1542  09/11/15 0527 09/12/15 0448 09/13/15 0258 09/14/15 0343 09/15/15 0441  NA 135  < > 138 137 136 137 133*  K 4.3  < > 4.0 4.1 4.4 4.1  3.8  CL 99*  < > 103 102 98* 102 99*  CO2 22  < > 24 26 25 25 26   GLUCOSE 121*  < > 108* 188* 128* 136* 170*  BUN 83*  < > 82* 82* 84* 84* 82*  CREATININE 3.27*  < > 3.15* 2.97* 3.19* 3.03* 3.01*  CALCIUM 8.6*  < > 8.6* 8.3* 8.5* 8.5* 8.4*  MG 2.5*  --   --   --   --   --   --   < > = values in this interval not displayed.  Liver Function Tests:  Recent Labs Lab 09/09/15 1326 09/09/15 1542  AST 63* 60*  ALT 63 67*  ALKPHOS 74 75  BILITOT 0.7 0.7  PROT 6.0* 6.3*  ALBUMIN 3.4* 3.6   No results for input(s): LIPASE, AMYLASE in the last 168 hours. No results for input(s): AMMONIA in the last 168 hours.  CBC:  Recent Labs Lab 09/09/15 1326 09/09/15 1542 09/12/15 0448 09/15/15 0441  WBC 5.9 6.1 5.1 7.1  NEUTROABS  --  5.5  --   --   HGB 10.3* 10.8* 10.1* 10.4*  HCT 32.6* 34.6* 31.8* 34.1*  MCV 90.1 89.2 89.6 88.6  PLT 109* 120* 120* 120*    Cardiac Enzymes:  Recent Labs Lab 09/09/15 1542 09/09/15 2140 09/10/15 [id number]  TROPONINI 0.03 0.03 0.04*    BNP: BNP (last 3 results)  Recent Labs  09/09/15 1326 09/09/15 1542  BNP 605.6* 729.6*    ProBNP (last 3 results) No results for input(s): PROBNP in the last 8760 hours.    Other results:  Imaging: Dg Chest Port 1 View  09/13/2015  CLINICAL DATA:  82 year old male with history of dyspnea. EXAM: PORTABLE CHEST 1 VIEW COMPARISON:  Chest x-ray 09/10/2015. FINDINGS: There is cephalization of the pulmonary vasculature, indistinctness of the interstitial markings, and patchy airspace disease throughout the lungs bilaterally suggestive of moderate pulmonary edema. Small bilateral pleural effusions. Lung volumes are low. No pneumothorax. Mild cardiomegaly. Upper mediastinal contours are within normal limits. Atherosclerosis in the thoracic aorta. Status post median sternotomy for CABG. IMPRESSION: 1. The appearance of the chest suggests worsening congestive heart failure, as above. 2. Atherosclerosis. Electronically  Signed   By: Vinnie Langton M.D.   On: 09/13/2015 16:17     Medications:     Scheduled Medications: . atorvastatin  80 mg Oral q1800  . cyanocobalamin  1,000 mcg Intramuscular Once  . doxazosin  8 mg Oral Daily  . ferumoxytol  510 mg Intravenous Weekly  . hydrALAZINE  50 mg Oral 3 times per day  . insulin aspart  0-15 Units Subcutaneous TID WC  . insulin aspart  0-5 Units Subcutaneous QHS  . insulin glargine  15 Units Subcutaneous Daily  . isosorbide mononitrate  60 mg Oral Daily  . levothyroxine  175 mcg Oral QAC breakfast  . memantine  5 mg Oral Daily  . metolazone  5 mg Oral BID  . pantoprazole  40 mg Oral Q0600  . potassium chloride  40 mEq Oral Daily  . sodium chloride flush  3 mL Intravenous Q12H  . Warfarin - Pharmacist Dosing Inpatient   Does not apply q1800    Infusions: . furosemide (LASIX) infusion 20 mg/hr (09/14/15 2314)    PRN Medications: sodium chloride, acetaminophen, guaifenesin, ondansetron (ZOFRAN) IV, sodium chloride flush   Assessment/ Plan /Discussion.    1. Acute on chronic diastolic CHF: EF 123456, moderate diastolic dysfunction. Marked volume overload still. He diuresed better yesterday and weight down 5 lbs, less short of breath - Continue Lasix to 20 mg/hr and metolazone 5 mg bid.  - If the above does not yield good results, will need HD for volume removal.  2. CKD: Stage IV. Follow closely with diuresis, this appears to be a limiting factor to volume management.  If we cannot diurese him adequately with current medical regimen, I think he will need HD. 3. Atrial fibrillation: Paroxysmal, in NSR. No beta blocker with bradycardia. Continue warfarin.  4. CAD: s/p CABG, stable. Continue statin.  5. AS: Moderate on echo.  6. Mitral stenosis: Mild on echo.  7. Hypothyroidism:TSH 1.5 . Was on synthroid prior to admit. Restarted 175 mcg synthroid. 8. HTN: BP remains high, increase hydralazine to 75 tid.   9. Anemia: Iron stores low. Got  Fe this admission.  10. Dementia  Loralie Champagne 09/15/2015 8:55 AM

## 2015-09-15 NOTE — Progress Notes (Signed)
[facility] Progress Note   Assessment/ Plan:   1. Acute renal failure on chronic kidney disease stage IV: Hemodynamically mediated acute on chronic renal failure from CHF exacerbation-with better response to lasix gtt/metolazone over the past 24hrs.No acute indications to initiate dialysis at this time-Lasix rate increased per cardiology recommendations. Mild hyponatremia likely from metolazone. 2. Acute exacerbation of diastolic heart failure: Better urine output overnight-3.9 L with net negative of 3.2 L and corresponding weight loss. We'll continue to follow on diuretics then decide on need to initiate dialysis. Remains volume overloaded but without respiratory distress. 3. Anemia of chronic kidney disease: With low iron saturation-getting intravenous iron . 4. Hypertension: blood pressure trends noted-anticipated to improve further with diuretic therapy. 5. Paroxysmal atrial fibrillation: Currently on warfarin for CVA prophylaxis. He is in sinus rhythm at this time and cannot tolerate beta blockers due to bradycardia.  Subjective:   No acute events overnight-wife was at bedside is concerned about his poor appetite, nutritional supplement started earlier.  Mr. Dacanay is more communicative today and states that he feels "fair"   Objective:   BP 152/52 mmHg  Pulse 60  Temp(Src) 99.4 F (37.4 C) (Oral)  Resp 18  Ht 5\' 10"  (1.778 m)  Wt 88.361 kg (194 lb 12.8 oz)  BMI 27.95 kg/m2  SpO2 92%  Intake/Output Summary (Last 24 hours) at 09/15/15 0932 Last data filed at 09/15/15 [id number]  Gross per 24 hour  Intake    720 ml  Output   3925 ml  Net  -3205 ml   Weight change: -1.95 kg (-4 lb 4.8 oz)  Physical Exam: [id number] Resting in bed CVS: Pulse regular bradycardia, S1 and S2 normal Resp: Decreased inspiratory effort with fine rales left base Abd: Soft, obese, nontender Ext: 2+ lower extremity edema.  Imaging: Dg Chest Port 1 View  09/13/2015  CLINICAL DATA:   82 year old male with history of dyspnea. EXAM: PORTABLE CHEST 1 VIEW COMPARISON:  Chest x-ray 09/10/2015. FINDINGS: There is cephalization of the pulmonary vasculature, indistinctness of the interstitial markings, and patchy airspace disease throughout the lungs bilaterally suggestive of moderate pulmonary edema. Small bilateral pleural effusions. Lung volumes are low. No pneumothorax. Mild cardiomegaly. Upper mediastinal contours are within normal limits. Atherosclerosis in the thoracic aorta. Status post median sternotomy for CABG. IMPRESSION: 1. The appearance of the chest suggests worsening congestive heart failure, as above. 2. Atherosclerosis. Electronically Signed   By: Vinnie Langton M.D.   On: 09/13/2015 16:17    Labs: BMET  Recent Labs Lab 09/09/15 1542 09/10/15 0337 09/11/15 0527 09/12/15 0448 09/13/15 0258 09/14/15 0343 09/15/15 0441  NA 135 138 138 137 136 137 133*  K 4.3 4.2 4.0 4.1 4.4 4.1 3.8  CL 99* 101 103 102 98* 102 99*  CO2 22 25 24 26 25 25 26   GLUCOSE 121* 80 108* 188* 128* 136* 170*  BUN 83* 83* 82* 82* 84* 84* 82*  CREATININE 3.27* 3.29* 3.15* 2.97* 3.19* 3.03* 3.01*  CALCIUM 8.6* 8.5* 8.6* 8.3* 8.5* 8.5* 8.4*   CBC  Recent Labs Lab 09/09/15 1326 09/09/15 1542 09/12/15 0448 09/15/15 0441  WBC 5.9 6.1 5.1 7.1  NEUTROABS  --  5.5  --   --   HGB 10.3* 10.8* 10.1* 10.4*  HCT 32.6* 34.6* 31.8* 34.1*  MCV 90.1 89.2 89.6 88.6  PLT 109* 120* 120* 120*    Medications:    . atorvastatin  80 mg Oral q1800  . cyanocobalamin  1,000 mcg Intramuscular Once  .  doxazosin  8 mg Oral Daily  . ferumoxytol  510 mg Intravenous Weekly  . hydrALAZINE  75 mg Oral 3 times per day  . insulin aspart  0-15 Units Subcutaneous TID WC  . insulin aspart  0-5 Units Subcutaneous QHS  . insulin glargine  15 Units Subcutaneous Daily  . isosorbide mononitrate  60 mg Oral Daily  . levothyroxine  175 mcg Oral QAC breakfast  . memantine  5 mg Oral Daily  . metolazone  5 mg  Oral BID  . pantoprazole  40 mg Oral Q0600  . potassium chloride  40 mEq Oral Daily  . sodium chloride flush  3 mL Intravenous Q12H  . Warfarin - Pharmacist Dosing Inpatient   Does not apply [id number]   Elmarie Shiley, MD 09/15/2015, 9:32 AM

## 2015-09-15 NOTE — Progress Notes (Signed)
[place] for Coumadin  Indication: atrial fibrillation  Allergies  Allergen Reactions  . Betadine [Povidone Iodine] Rash  . Iodinated Diagnostic Agents Rash and Other (See Comments)    Does fine with premedications for cath  . Latex Hives    Patient Measurements: Height: 5\' 10"  (177.8 cm) Weight: 194 lb 12.8 oz (88.361 kg) (scale c) IBW/kg (Calculated) : 73  Vital Signs: Temp: 99.4 F (37.4 C) (03/22 0456) Temp Source: Oral (03/22 0456) BP: 152/52 mmHg (03/22 0456) Pulse Rate: 60 (03/22 0456)  Labs:  Recent Labs  09/13/15 0258 09/14/15 0343 09/15/15 0441  HGB  --   --  10.4*  HCT  --   --  34.1*  PLT  --   --  120*  LABPROT 30.2* 28.4* 26.0*  INR 2.95* 2.72* 2.42*  CREATININE 3.19* 3.03* 3.01*    Estimated Creatinine Clearance: 22.3 mL/min (by C-G formula based on Cr of 3.01).    Assessment: 89 yom admitted 09/09/2015 with SOB. Patient on coumadin PTA for AFib. Pharmacy consulted to continue dosing inpatient.   INR 2.42 therapeutic. Hgb 10.4, Plts 120.  No s/sx bleeding noted. Patient missed evening dose of coumadin on 3/20.  PTA coumadin dose = 5mg  on Sundays and Thursdays, and 2.5mg  on Mon, Tue, Wed, and Sat. (Last dose 3/15)   Goal of Therapy:  INR 2-3 Monitor platelets by anticoagulation protocol: Yes   Plan:  -Warfarin 2.5mg  x 1 -daily PT/INR, cbc q3days -Monitor for s/sx of bleeding    [name], PharmD Clinical Pharmacy Resident Pager: [phone] 09/15/2015 12:10 PM

## 2015-09-15 NOTE — Consult Note (Signed)
   [facility] CM Inpatient Consult   09/15/2015  [name] [birth year] [id number] Patient screened and discussed in progression for potential [place] Management services. Patient is eligible for [facility] Care Management services with his Medicare.   Patient admitted with HF exacerbation. [facility] Care Management services not appropriate at this time for home visits.  Inpatient RNCM signed patient for EMMI HF follow up. . For questions please contact:   Natividad Brood, RN BSN [facility] Liaison  [phone] business mobile phone Toll free office [phone]

## 2015-09-16 LAB — BASIC METABOLIC PANEL
Anion gap: 17 — ABNORMAL HIGH (ref 5–15)
BUN: 87 mg/dL — AB (ref 6–20)
CHLORIDE: 94 mmol/L — AB (ref 101–111)
CO2: 27 mmol/L (ref 22–32)
CREATININE: 3.04 mg/dL — AB (ref 0.61–1.24)
Calcium: 8.8 mg/dL — ABNORMAL LOW (ref 8.9–10.3)
GFR calc Af Amer: 21 mL/min — ABNORMAL LOW (ref >60)
GFR calc non Af Amer: 18 mL/min — ABNORMAL LOW (ref >60)
GLUCOSE: 211 mg/dL — AB (ref 65–99)
POTASSIUM: 3.9 mmol/L (ref 3.5–5.1)
Sodium: 138 mmol/L (ref 135–145)

## 2015-09-16 LAB — GLUCOSE, CAPILLARY
GLUCOSE-CAPILLARY: 174 mg/dL — AB (ref 65–99)
GLUCOSE-CAPILLARY: 182 mg/dL — AB (ref 65–99)
GLUCOSE-CAPILLARY: 198 mg/dL — AB (ref 65–99)
GLUCOSE-CAPILLARY: 172 mg/dL — AB (ref 65–99)

## 2015-09-16 LAB — PROTIME-INR
INR: 2.35 — AB (ref 0.00–1.49)
Prothrombin Time: 25.5 seconds — ABNORMAL HIGH (ref 11.6–15.2)

## 2015-09-16 MED ORDER — HYDROCODONE-ACETAMINOPHEN 5-325 MG PO TABS
1.0000 | ORAL_TABLET | Freq: Once | ORAL | Status: AC
  Administered 2015-09-16: 1 via ORAL
  Filled 2015-09-16: qty 1

## 2015-09-16 MED ORDER — WARFARIN SODIUM 5 MG PO TABS
5.0000 mg | ORAL_TABLET | Freq: Once | ORAL | Status: AC
  Administered 2015-09-16: 5 mg via ORAL
  Filled 2015-09-16: qty 1

## 2015-09-16 NOTE — Progress Notes (Signed)
ANTICOAGULATION Consult Note - Follow up consult  Pharmacy Consult for Coumadin  Indication: atrial fibrillation  Allergies  Allergen Reactions  . Betadine [Povidone Iodine] Rash  . Iodinated Diagnostic Agents Rash and Other (See Comments)    Does fine with premedications for cath  . Latex Hives    Patient Measurements: Height: 5\' 10"  (177.8 cm) Weight: 194 lb 12.8 oz (88.361 kg) (Scale C) IBW/kg (Calculated) : 73  Vital Signs: Temp: 98.4 F (36.9 C) (03/23 0449) Temp Source: Oral (03/23 0449) BP: 145/66 mmHg (03/23 0449) Pulse Rate: 58 (03/23 0449)  Labs:  Recent Labs  09/14/15 0343 09/15/15 0441 09/16/15 0534  HGB  --  10.4*  --   HCT  --  34.1*  --   PLT  --  120*  --   LABPROT 28.4* 26.0* 25.5*  INR 2.72* 2.42* 2.35*  CREATININE 3.03* 3.01* 3.04*    Estimated Creatinine Clearance: 22.1 mL/min (by C-G formula based on Cr of 3.04).   Assessment: [name] admitted 09/09/2015 with SOB. Patient on coumadin PTA for AFib. Pharmacy consulted to continue dosing inpatient.   INR remains therapeutic at 2.35. He was on amiodarone pta which has been stopped due to bradycardia. If he does not go home on amiodarone will likely need to adjust home dose.  PTA dose = 5mg  Sun/Thu, 2.5mg  Mon/Tue/Wed/Sat - last dose 3/15  Goal of Therapy:  INR 2-3 Monitor platelets by anticoagulation protocol: Yes   Plan:  1) Increase coumadin to 5mg  x 1 2) Daily INR   Nena Jordan, PharmD, BCPS 09/16/2015 9:22 AM

## 2015-09-16 NOTE — Care Management Important Message (Signed)
Important Message  Patient Details  Name: [name] MRN: [id number] Date of Birth: [birth year]   Medicare Important Message Given:  Yes    [name] 09/16/2015, 1:24 PM

## 2015-09-16 NOTE — Progress Notes (Signed)
At 1130 introduced self to pt as incoming nurse till 7pm.  Call bell at reach.  Instructed to call for assistance as needed.  Verbalized understanding.  Will continue to monitor.  Karie Kirks, Therapist, sports.

## 2015-09-16 NOTE — Progress Notes (Signed)
Patient ID: [name], male   DOB: [birth year], 82 y.o.   MRN: [id number]    Advanced Heart Failure Rounding Note   Subjective:    He continues on Lasix gtt + metolazone 5 bid.   Complaining of fatigue. Poor appetite. Ongoing dyspnea with exertion.   Creatinine  3.1>3.03>3.01>3.04   09/10/2015: EF 60-65% MV severely calcified. Grade II DD . Moderate AS     Objective:   Weight Range:  Vital Signs:   Temp:  [97.7 F (36.5 C)-98.6 F (37 C)] 98.4 F (36.9 C) (03/23 0449) Pulse Rate:  [58-65] 58 (03/23 0449) Resp:  [17-20] 17 (03/23 0449) BP: (145-150)/(49-66) 145/66 mmHg (03/23 0449) SpO2:  [90 %-97 %] 92 % (03/23 0449) Weight:  [194 lb 12.8 oz (88.361 kg)] 194 lb 12.8 oz (88.361 kg) (03/23 0449) Last BM Date: 09/14/15  Weight change: Filed Weights   09/14/15 0617 09/15/15 0456 09/16/15 0449  Weight: 199 lb 1.6 oz (90.311 kg) 194 lb 12.8 oz (88.361 kg) 194 lb 12.8 oz (88.361 kg)    Intake/Output:   Intake/Output Summary (Last 24 hours) at 09/16/15 1014 Last data filed at 09/16/15 0958  Gross per 24 hour  Intake    540 ml  Output   2975 ml  Net  -2435 ml     Physical Exam: General:  Chronically ill appearing. NAD .  HEENT: normal Neck: supple. JVP to ear.  Carotids 2+ bilat; no bruits. No lymphadenopathy or thryomegaly appreciated. Cor: PMI nondisplaced. Regular rate & rhythm. No rubs, gallops. AS .  Lungs: RML RLL LLL crackles on 2 liters oxygen. On 2 liters [place]  Abdomen: soft, nontender, nondistended. No hepatosplenomegaly. No bruits or masses. Good bowel sounds. Extremities: no cyanosis, clubbing, rash.  R and LLE compression stockings. 2+ edema to knees bilaterally. LUE AVF  Neuro: alert & orientedx3, cranial nerves grossly intact. moves all 4 extremities w/o difficulty. Affect pleasant  Telemetry: Sinus Brady 50s   Labs: Basic Metabolic Panel:  Recent Labs Lab 09/09/15 1542  09/12/15 0448 09/13/15 0258 09/14/15 0343 09/15/15 0441 09/16/15 0534    NA 135  < > 137 136 137 133* 138  K 4.3  < > 4.1 4.4 4.1 3.8 3.9  CL 99*  < > 102 98* 102 99* 94*  CO2 22  < > 26 25 25 26 27   GLUCOSE 121*  < > 188* 128* 136* 170* 211*  BUN 83*  < > 82* 84* 84* 82* 87*  CREATININE 3.27*  < > 2.97* 3.19* 3.03* 3.01* 3.04*  CALCIUM 8.6*  < > 8.3* 8.5* 8.5* 8.4* 8.8*  MG 2.5*  --   --   --   --   --   --   < > = values in this interval not displayed.  Liver Function Tests:  Recent Labs Lab 09/09/15 1326 09/09/15 1542  AST 63* 60*  ALT 63 67*  ALKPHOS 74 75  BILITOT 0.7 0.7  PROT 6.0* 6.3*  ALBUMIN 3.4* 3.6   No results for input(s): LIPASE, AMYLASE in the last 168 hours. No results for input(s): AMMONIA in the last 168 hours.  CBC:  Recent Labs Lab 09/09/15 1326 09/09/15 1542 09/12/15 0448 09/15/15 0441  WBC 5.9 6.1 5.1 7.1  NEUTROABS  --  5.5  --   --   HGB 10.3* 10.8* 10.1* 10.4*  HCT 32.6* 34.6* 31.8* 34.1*  MCV 90.1 89.2 89.6 88.6  PLT 109* 120* 120* 120*    Cardiac Enzymes:  Recent  Labs Lab 09/09/15 1542 09/09/15 2140 09/10/15 0337  TROPONINI 0.03 0.03 0.04*    BNP: BNP (last 3 results)  Recent Labs  09/09/15 1326 09/09/15 1542  BNP 605.6* 729.6*    ProBNP (last 3 results) No results for input(s): PROBNP in the last 8760 hours.    Other results:  Imaging: No results found.   Medications:     Scheduled Medications: . atorvastatin  80 mg Oral q1800  . cyanocobalamin  1,000 mcg Intramuscular Once  . doxazosin  8 mg Oral Daily  . ferumoxytol  510 mg Intravenous Weekly  . hydrALAZINE  75 mg Oral 3 times per day  . insulin aspart  0-15 Units Subcutaneous TID WC  . insulin aspart  0-5 Units Subcutaneous QHS  . insulin glargine  15 Units Subcutaneous Daily  . isosorbide mononitrate  60 mg Oral Daily  . levothyroxine  175 mcg Oral QAC breakfast  . memantine  5 mg Oral Daily  . metolazone  5 mg Oral BID  . pantoprazole  40 mg Oral Q0600  . potassium chloride  40 mEq Oral Daily  . sodium chloride  flush  3 mL Intravenous Q12H  . warfarin  5 mg Oral ONCE-1800  . Warfarin - Pharmacist Dosing Inpatient   Does not apply q1800    Infusions: . furosemide (LASIX) infusion 20 mg/hr (09/16/15 0036)    PRN Medications: sodium chloride, acetaminophen, guaifenesin, ondansetron (ZOFRAN) IV, sodium chloride flush   Assessment/ Plan /Discussion.    1. Acute on chronic diastolic CHF: EF [id number], moderate diastolic dysfunction. Marked volume overload still. He diuresed better yesterday and weight down 5 lbs, less short of breath - Continue Lasix to 20 mg/hr and metolazone 5 mg bid.  - Diuresis nicely. No role for HD.   2. CKD: Stage IV. Follow closely with diuresis. High dose diuretics working. No role for HD. Marland Kitchen 3. Atrial fibrillation: Paroxysmal, in NSR. No beta blocker with bradycardia. Continue warfarin. INR 2.35  4. CAD: s/p CABG, stable. Continue statin.  5. AS: Moderate on echo.  6. Mitral stenosis: Mild on echo.  7. Hypothyroidism:TSH 1.5 . Was on synthroid prior to admit. Continue 175 mcg synthroid. 8. HTN: BP remains high, continue hydralazine to 75 tid.   9. Anemia: Iron stores low. Got Fe this admission.  10. Dementia 11. Deconditioning: Consult PT/OT.  12. Poor PO intake: Consult dietitian   Will need to start thinking about disposition. CIR? SNF? HH?   Amy Clegg NP-C  09/16/2015 10:14 AM  Patient seen with NP. Will continue current diuretic regimen as it seems to be working reasonably well.  Still very volume overloaded.  If diuresis slows and he remains volume overloaded, will need HD.  Loralie Champagne 09/16/2015 1:42 PM

## 2015-09-16 NOTE — Progress Notes (Signed)
[facility] Progress Note   Assessment/ Plan:   1. Acute renal failure on chronic kidney disease stage IV: Hemodynamically mediated acute on chronic renal failure from CHF exacerbation-with acceptable/continued net negative fluid response to lasix gtt/metolazone over the past 48hrs. Renal function essentially unchanged and he is without any acute indications to initiate dialysis. 2. Acute exacerbation of diastolic heart failure: Better urine output overnight-2.5 L with net negative of 2.1 L and surprisingly unchanged weight. Remains volume overloaded but without respiratory distress. Will continue current diuretic regimen/dose with an acceptable urine output at this time. 3. Anemia of chronic kidney disease: With low iron saturation-getting intravenous iron . 4. Hypertension: blood pressure trends noted- appear to be improving with diuretics. 5. Paroxysmal atrial fibrillation: Currently on warfarin for CVA prophylaxis. He is in sinus rhythm at this time and cannot tolerate beta blockers due to bradycardia. 6. Poor oral intake/anorexia: Awaiting RD consult  Subjective:   Had episode of chest pain yesterday-appears to have been musculoskeletal (reproducible by pressing on left side of chest). Wife very concerned that he is "wasting away" because he is not eating.    Objective:   BP 145/66 mmHg  Pulse 58  Temp(Src) 98.4 F (36.9 C) (Oral)  Resp 17  Ht 5\' 10"  (1.778 m)  Wt 88.361 kg (194 lb 12.8 oz)  BMI 27.95 kg/m2  SpO2 92%  Intake/Output Summary (Last 24 hours) at 09/16/15 0947 Last data filed at 09/16/15 [id number]  Gross per 24 hour  Intake    420 ml  Output   2525 ml  Net  -2105 ml   Weight change: 0 kg (0 lb)  Physical Exam: [id number] Resting in bed CVS: Pulse regular bradycardia, S1 and S2 normal Resp: Decreased inspiratory effort with fine rales left base Abd: Soft, obese, nontender Ext: 2+ lower extremity edema.  Imaging: No results  found.  Labs: BMET  Recent Labs Lab 09/10/15 0337 09/11/15 0527 09/12/15 0448 09/13/15 0258 09/14/15 0343 09/15/15 0441 09/16/15 0534  NA 138 138 137 136 137 133* 138  K 4.2 4.0 4.1 4.4 4.1 3.8 3.9  CL 101 103 102 98* 102 99* 94*  CO2 25 24 26 25 25 26 27   GLUCOSE 80 108* 188* 128* 136* 170* 211*  BUN 83* 82* 82* 84* 84* 82* 87*  CREATININE 3.29* 3.15* 2.97* 3.19* 3.03* 3.01* 3.04*  CALCIUM 8.5* 8.6* 8.3* 8.5* 8.5* 8.4* 8.8*   CBC  Recent Labs Lab 09/09/15 1326 09/09/15 1542 09/12/15 0448 09/15/15 0441  WBC 5.9 6.1 5.1 7.1  NEUTROABS  --  5.5  --   --   HGB 10.3* 10.8* 10.1* 10.4*  HCT 32.6* 34.6* 31.8* 34.1*  MCV 90.1 89.2 89.6 88.6  PLT 109* 120* 120* 120*    Medications:    . atorvastatin  80 mg Oral q1800  . cyanocobalamin  1,000 mcg Intramuscular Once  . doxazosin  8 mg Oral Daily  . ferumoxytol  510 mg Intravenous Weekly  . hydrALAZINE  75 mg Oral 3 times per day  . insulin aspart  0-15 Units Subcutaneous TID WC  . insulin aspart  0-5 Units Subcutaneous QHS  . insulin glargine  15 Units Subcutaneous Daily  . isosorbide mononitrate  60 mg Oral Daily  . levothyroxine  175 mcg Oral QAC breakfast  . memantine  5 mg Oral Daily  . metolazone  5 mg Oral BID  . pantoprazole  40 mg Oral Q0600  . potassium chloride  40 mEq Oral Daily  .  sodium chloride flush  3 mL Intravenous Q12H  . warfarin  5 mg Oral ONCE-1800  . Warfarin - Pharmacist Dosing Inpatient   Does not apply [id number]   Elmarie Shiley, MD 09/16/2015, 9:47 AM

## 2015-09-16 NOTE — Progress Notes (Signed)
Pt with another episode of chest pain. RN able to reproduce chest pain upon palpation of left side of chest. MD paged. Orders received for Vicodin 5/325 x1. Will continue to monitor.

## 2015-09-16 NOTE — Evaluation (Signed)
Physical Therapy Evaluation Patient Details Name: [name] MRN: [id number] DOB: 11-02-1933 Today's Date: 09/16/2015   History of Present Illness  [name] is a 82 y.o. male with complaint of 2-3 week history of lower extremity swelling, cough, dyspnea, and recent visit with primary physician, Dr. Philip Aspen who informed the patient and his daughter that he was in heart failure.  pt with fistula for anticipated HD  Clinical Impression  Pt was walking to the bathroom when he first got to hospital and now has hard time doing bed to [facility] transfer.  Pt very congested with sitting - not taking deep breaths.  O2 sats 90% on 2L at rest.  Pt incontinent of stool with standing.  Gave pt inspirometer and encouraged deep breathing - wife to work on this with pt.  [place]fe cannot handle pt at this level of function - he will needs SNF rehab prior to home.    Follow Up Recommendations SNF;Supervision/Assistance - 24 hour    Equipment Recommendations  None recommended by PT    Recommendations for Other Services       Precautions / Restrictions Precautions Precautions: Fall Restrictions Weight Bearing Restrictions: No      Mobility  Bed Mobility Overal bed mobility: Needs Assistance Bed Mobility: Supine to Sit     Supine to sit: Mod assist;HOB elevated        Transfers Overall transfer level: Needs assistance Equipment used: Rolling walker (2 wheeled)             General transfer comment: pt stood and turned from bed to [facility] with RW and mod to min assist.  Pt leaning heavily on RW.  pt not very sure of himself  Ambulation/Gait   Ambulation Distance (Feet):  (unable)         General Gait Details: our goal was to stand and walk toward door with chiar following.  Pt incontinuent of bowels when he stood EOB.  pt leaning heavily on RW - not able to walk today.  Stairs            Wheelchair Mobility    Modified Rankin (Stroke Patients Only)       Balance                                              Pertinent Vitals/Pain Pain Assessment: No/denies pain    Home Living Family/patient expects to be discharged to:: Private residence Living Arrangements: Spouse/significant other Available Help at Discharge: Family Type of Home: House Home Access: Stairs to enter   [employer] of Steps: pt has 3 steps with both rails and able to do it prior with wifes SBA only Home Layout: One level   Additional Comments: pt was independent wiht toileting prior with RW. he had recent fall in bathroom.  Son li ves nearby but works    Prior Function           Comments: pt was independent wiht toileting prior with RW. he had recent fall in bathroom.  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  I gave pt inspirometer and tried to teach him to do this.  he was able to get to 400 ml inconsistently - coughing every time.  encouraged wife to work on this wtih pt    Exercises        Assessment/Plan    PT Assessment Patient needs continued PT services  PT Diagnosis Difficulty walking;Generalized weakness;Abnormality of gait   PT Problem List Decreased strength;Decreased activity tolerance;Decreased balance;Decreased mobility;Decreased knowledge of use of DME;Decreased safety awareness;Cardiopulmonary status limiting activity  PT Treatment Interventions  DME instruction;Gait training;Functional mobility training;Therapeutic activities;Therapeutic exercise;Patient/family education   PT Goals (Current goals can be found in the Care Plan section) Acute Rehab PT Goals Patient Stated Goal: wife wants pt to get stronger and back home at prior level of function PT Goal Formulation: With patient/family Time For Goal Achievement: 09/30/15 Potential to Achieve Goals: Good    Frequency Min 3X/week   Barriers to discharge   wife will be the main caregiver. she is not abel to care for him at this level.  He will need SNF placement to get stronger prior to DC    Co-evaluation               End of Session   Activity Tolerance: Patient limited by fatigue (limited by bowel incontinence) Patient left: in chair;with family/visitor present Nurse Communication: Mobility status         Time: 1425-1515 PT Time Calculation (min) (ACUTE ONLY): 50 min   Charges:   PT Evaluation $PT Eval Low Complexity: 1 Procedure PT Treatments $Therapeutic Activity: 38-52 mins   PT G Codes:        Loyal Buba 09/16/2015, 3:40 PM 09/16/2015   Rande Lawman, PT

## 2015-09-16 NOTE — Progress Notes (Signed)
Pt with 4/10 chest pain. EKG performed. VS stable. PRN Robitussin given for cough. Pt resting in bed. MD paged. Will continue to monitor.

## 2015-09-17 LAB — PROTIME-INR
INR: 3.25 — AB (ref 0.00–1.49)
PROTHROMBIN TIME: 32.5 s — AB (ref 11.6–15.2)

## 2015-09-17 LAB — RENAL FUNCTION PANEL
ANION GAP: 11 (ref 5–15)
Albumin: 2.9 g/dL — ABNORMAL LOW (ref 3.5–5.0)
BUN: 93 mg/dL — ABNORMAL HIGH (ref 6–20)
CALCIUM: 8.8 mg/dL — AB (ref 8.9–10.3)
CHLORIDE: 95 mmol/L — AB (ref 101–111)
CO2: 30 mmol/L (ref 22–32)
CREATININE: 3.19 mg/dL — AB (ref 0.61–1.24)
GFR, EST AFRICAN AMERICAN: 20 mL/min — AB (ref >60)
GFR, EST NON AFRICAN AMERICAN: 17 mL/min — AB (ref >60)
Glucose, Bld: 207 mg/dL — ABNORMAL HIGH (ref 65–99)
Phosphorus: 5.3 mg/dL — ABNORMAL HIGH (ref 2.5–4.6)
Potassium: 3.8 mmol/L (ref 3.5–5.1)
Sodium: 136 mmol/L (ref 135–145)

## 2015-09-17 LAB — GLUCOSE, CAPILLARY
GLUCOSE-CAPILLARY: 212 mg/dL — AB (ref 65–99)
Glucose-Capillary: 175 mg/dL — ABNORMAL HIGH (ref 65–99)
Glucose-Capillary: 224 mg/dL — ABNORMAL HIGH (ref 65–99)
Glucose-Capillary: 162 mg/dL — ABNORMAL HIGH (ref 65–99)

## 2015-09-17 LAB — URINE MICROSCOPIC-ADD ON

## 2015-09-17 LAB — URINALYSIS, ROUTINE W REFLEX MICROSCOPIC
BILIRUBIN URINE: NEGATIVE
Glucose, UA: NEGATIVE mg/dL
KETONES UR: NEGATIVE mg/dL
NITRITE: NEGATIVE
PH: 5 (ref 5.0–8.0)
Protein, ur: NEGATIVE mg/dL
SPECIFIC GRAVITY, URINE: 1.01 (ref 1.005–1.030)

## 2015-09-17 LAB — MAGNESIUM: MAGNESIUM: 2.2 mg/dL (ref 1.7–2.4)

## 2015-09-17 MED ORDER — AMLODIPINE BESYLATE 5 MG PO TABS
5.0000 mg | ORAL_TABLET | Freq: Every day | ORAL | Status: DC
  Administered 2015-09-17 – 2015-09-18 (×2): 5 mg via ORAL
  Filled 2015-09-17 (×7): qty 1

## 2015-09-17 MED ORDER — GUAIFENESIN 100 MG/5ML PO SOLN: 10.0000 mL | Freq: Every day | ORAL | Status: DC

## 2015-09-17 MED ORDER — ALBUTEROL SULFATE (2.5 MG/3ML) 0.083% IN NEBU
2.5000 mg | INHALATION_SOLUTION | Freq: Four times a day (QID) | RESPIRATORY_TRACT | Status: DC | PRN
  Administered 2015-09-27: 2.5 mg via RESPIRATORY_TRACT
  Filled 2015-09-17: qty 3

## 2015-09-17 MED ORDER — GLUCERNA SHAKE PO LIQD
237.0000 mL | Freq: Three times a day (TID) | ORAL | Status: DC
  Administered 2015-09-17 – 2015-09-18 (×4): 237 mL via ORAL

## 2015-09-17 MED ORDER — BENZONATATE 100 MG PO CAPS
100.0000 mg | ORAL_CAPSULE | Freq: Two times a day (BID) | ORAL | Status: DC
  Administered 2015-09-17 – 2015-09-29 (×20): 100 mg via ORAL
  Filled 2015-09-17 (×26): qty 1

## 2015-09-17 NOTE — Progress Notes (Signed)
ANTICOAGULATION Consult Note - Follow up consult  Pharmacy Consult for Coumadin  Indication: atrial fibrillation  Allergies  Allergen Reactions  . Betadine [Povidone Iodine] Rash  . Iodinated Diagnostic Agents Rash and Other (See Comments)    Does fine with premedications for cath  . Latex Hives    Patient Measurements: Height: 5\' 10"  (177.8 cm) Weight: 186 lb 3.2 oz (84.46 kg) (scale c) IBW/kg (Calculated) : 73  Vital Signs:    Labs:  Recent Labs  09/15/15 0441 09/16/15 0534 09/17/15 0540  HGB 10.4*  --   --   HCT 34.1*  --   --   PLT 120*  --   --   LABPROT 26.0* 25.5* 32.5*  INR 2.42* 2.35* 3.25*  CREATININE 3.01* 3.04* 3.19*    Estimated Creatinine Clearance: 19.4 mL/min (by C-G formula based on Cr of 3.19).   Assessment: 57 yom admitted 09/09/2015 with SOB. Patient on coumadin PTA for AFib. Pharmacy consulted to continue dosing inpatient.   INR has now jumped from 2.35 to 3.25 after 5mg  dose given yesterday. No bleeding reported. He was on amiodarone pta which has been stopped due to bradycardia. If he does not go home on amiodarone may need to adjust home dose.   PTA dose = 5mg  Sun/Thu, 2.5mg  Mon/Tue/Wed/Sat - last dose 3/15  Goal of Therapy:  INR 2-3 Monitor platelets by anticoagulation protocol: Yes   Plan:  1) No coumadin tonight 2) Daily INR   Nena Jordan, PharmD, BCPS 09/17/2015 9:04 AM

## 2015-09-17 NOTE — Progress Notes (Signed)
Physical Therapy Treatment Patient Details Name: [name] MRN: [id number] DOB: 1934/05/22 Today's Date: 09/17/2015    History of Present Illness [name] is a 82 y.o. male with complaint of 2-3 week history of lower extremity swelling, cough, dyspnea, and recent visit with primary physician, Dr. Philip Aspen who informed the patient and his daughter that he was in heart failure.  pt with fistula for anticipated HD    PT Comments    Patient seen for mobility progression. At this time, patient extremely limited with activity tolerance. Saturations Supine 86% 2 L on arrival, Sitting 88-89% 3 L Sit<>Stand and return supine 90% on 4 L Rn made aware. Patient appears very debilitated overall and continues to require increased physical assist. At this time, continue to recommend ST SNF upon acute discharge.   Follow Up Recommendations  SNF;Supervision/Assistance - 24 hour     Equipment Recommendations  None recommended by PT    Recommendations for Other Services       Precautions / Restrictions Precautions Precautions: Fall Precaution Comments: watch oxygen Restrictions Weight Bearing Restrictions: No    Mobility  Bed Mobility Overal bed mobility: Needs Assistance Bed Mobility: Supine to Sit;Sit to Supine     Supine to sit: +2 for physical assistance;Min assist Sit to supine: +2 for physical assistance;Mod assist   General bed mobility comments: required (A) with bil LE in and out of bed. pt able to initiate OOB with decr (A) required  Transfers Overall transfer level: Needs assistance Equipment used: Rolling walker (2 wheeled) Transfers: Sit to/from Stand Sit to Stand: +2 physical assistance;Mod assist         General transfer comment: cues for sequence  Ambulation/Gait Ambulation/Gait assistance: Min assist;+2 physical assistance Ambulation Distance (Feet): 3 Feet Assistive device: Rolling walker (2 wheeled)       General Gait Details: shuffling steps  toward [facility], patient with poor activity tolerance decreased saturations   Stairs            Wheelchair Mobility    Modified Rankin (Stroke Patients Only)       Balance Overall balance assessment: Needs assistance Sitting-balance support: Bilateral upper extremity supported Sitting balance-Leahy Scale: Poor     Standing balance support: Bilateral upper extremity supported;During functional activity Standing balance-Leahy Scale: Poor                      Cognition Arousal/Alertness: Lethargic Behavior During Therapy: Flat affect Overall Cognitive Status: History of cognitive impairments - at baseline                      Exercises      General Comments General comments (skin integrity, edema, etc.): pt demonstrates 86% oxygen 2 L, 88-89% on 3L and 90% on 4L at end of session.       Pertinent Vitals/Pain Pain Assessment: Faces Faces Pain Scale: Hurts whole lot Pain Location: everywhere Pain Intervention(s): Monitored during session    [place] expects to be discharged to:: Private residence Living Arrangements: Spouse/significant other Available Help at Discharge: Family Type of Home: House Home Access: Stairs to enter   Home Layout: One level   Additional Comments: pt was independent wiht toileting prior with RW. he had recent fall in bathroom.  Son li ves nearby but works    Prior Function Level of Independence: Independent with assistive device(s)          PT Goals (current goals can now be found in  the care plan section) Acute Rehab PT Goals Patient Stated Goal: none stated PT Goal Formulation: With patient/family Time For Goal Achievement: 09/30/15 Potential to Achieve Goals: Good Progress towards PT goals: Progressing toward goals    Frequency  Min 3X/week    PT Plan      Co-evaluation PT/OT/SLP Co-Evaluation/Treatment: Yes Reason for Co-Treatment: Complexity of the patient's impairments (multi-system  involvement) PT goals addressed during session: Mobility/safety with mobility OT goals addressed during session: ADL's and self-care;Strengthening/ROM     End of Session Equipment Utilized During Treatment: Gait belt;Oxygen Activity Tolerance: Treatment limited secondary to medical complications (Comment) (desaturations with very limited activity) Patient left: in bed;with call bell/phone within reach;with bed alarm set;with family/visitor present     Time: [id number] PT Time Calculation (min) (ACUTE ONLY): 25 min  Charges:  $Therapeutic Activity: 8-22 mins                    G CodesDuncan Dull 2015-09-22, 1:59 PM Alben Deeds, [place] DPT  [phone]

## 2015-09-17 NOTE — Progress Notes (Signed)
Patient ID: [name], male   DOB: November 29, 1933, 82 y.o.   MRN: [id number]    Advanced Heart Failure Rounding Note   Subjective:    He remains on Lasix gtt 20 + metolazone 5 bid.   Was up most of the night coughing.  Low appetite.  Gets tired chewing his food. Ongoing fatigue and DOE.   Creatinine  3.1>3.03>3.01>3.04 > 3.19. K 3.8.  Out 2.4 L, weight shows down 8 lbs.   09/10/2015: EF 60-65% MV severely calcified. Grade II DD . Moderate AS     Objective:   Weight Range:  Vital Signs:   Temp:  [97.7 F (36.5 C)-98.5 F (36.9 C)] 97.7 F (36.5 C) (03/23 2100) Pulse Rate:  [55-63] 63 (03/23 2100) Resp:  [16] 16 (03/23 2100) BP: (141-147)/(43-81) 141/81 mmHg (03/23 2100) SpO2:  [91 %-94 %] 94 % (03/23 2100) Weight:  [186 lb 3.2 oz (84.46 kg)] 186 lb 3.2 oz (84.46 kg) (03/24 0504) Last BM Date: 09/15/15  Weight change: Filed Weights   09/15/15 0456 09/16/15 0449 09/17/15 0504  Weight: 194 lb 12.8 oz (88.361 kg) 194 lb 12.8 oz (88.361 kg) 186 lb 3.2 oz (84.46 kg)    Intake/Output:   Intake/Output Summary (Last 24 hours) at 09/17/15 0838 Last data filed at 09/17/15 [id number]  Gross per 24 hour  Intake    600 ml  Output   2351 ml  Net  -1751 ml     Physical Exam: General:  Chronically ill appearing and Elderly appearing. NAD.  HEENT: normal Neck: supple. JVP to ear.  Carotids 2+ bilat; no bruits. No thyromegaly or nodule noted.  Cor: PMI nondisplaced. Regular rate & rhythm. No rubs, gallops. AS .  Lungs: RML RLL LLL crackles on 2 liters oxygen. On 2 liters [place]  Abdomen: soft, nontender, nondistended. No hepatosplenomegaly. No bruits or masses. Good bowel sounds. Extremities: no cyanosis, clubbing, rash.  R and LLE compression stockings. 2+ edema to knees bilaterally. LUE AVF  Neuro: alert & orientedx3, cranial nerves grossly intact. moves all 4 extremities w/o difficulty. Affect pleasant  Telemetry: Reviewed,  Sinus Brady 50-60s   Labs: Basic Metabolic  Panel:  Recent Labs Lab 09/13/15 0258 09/14/15 0343 09/15/15 0441 09/16/15 0534 09/17/15 0540  NA 136 137 133* 138 136  K 4.4 4.1 3.8 3.9 3.8  CL 98* 102 99* 94* 95*  CO2 25 25 26 27 30   GLUCOSE 128* 136* 170* 211* 207*  BUN 84* 84* 82* 87* 93*  CREATININE 3.19* 3.03* 3.01* 3.04* 3.19*  CALCIUM 8.5* 8.5* 8.4* 8.8* 8.8*  MG  --   --   --   --  2.2  PHOS  --   --   --   --  5.3*    Liver Function Tests:  Recent Labs Lab 09/17/15 0540  ALBUMIN 2.9*   No results for input(s): LIPASE, AMYLASE in the last 168 hours. No results for input(s): AMMONIA in the last 168 hours.  CBC:  Recent Labs Lab 09/12/15 0448 09/15/15 0441  WBC 5.1 7.1  HGB 10.1* 10.4*  HCT 31.8* 34.1*  MCV 89.6 88.6  PLT 120* 120*    Cardiac Enzymes: No results for input(s): CKTOTAL, CKMB, CKMBINDEX, TROPONINI in the last 168 hours.  BNP: BNP (last 3 results)  Recent Labs  09/09/15 1326 09/09/15 1542  BNP 605.6* 729.6*    ProBNP (last 3 results) No results for input(s): PROBNP in the last 8760 hours.    Other results:  Imaging: No  results found.   Medications:     Scheduled Medications: . atorvastatin  80 mg Oral q1800  . cyanocobalamin  1,000 mcg Intramuscular Once  . doxazosin  8 mg Oral Daily  . ferumoxytol  510 mg Intravenous Weekly  . hydrALAZINE  75 mg Oral 3 times per day  . insulin aspart  0-15 Units Subcutaneous TID WC  . insulin aspart  0-5 Units Subcutaneous QHS  . insulin glargine  15 Units Subcutaneous Daily  . isosorbide mononitrate  60 mg Oral Daily  . levothyroxine  175 mcg Oral QAC breakfast  . memantine  5 mg Oral Daily  . metolazone  5 mg Oral BID  . pantoprazole  40 mg Oral Q0600  . potassium chloride  40 mEq Oral Daily  . sodium chloride flush  3 mL Intravenous Q12H  . Warfarin - Pharmacist Dosing Inpatient   Does not apply q1800    Infusions: . furosemide (LASIX) infusion 20 mg/hr (09/17/15 0609)    PRN Medications: sodium chloride,  acetaminophen, guaifenesin, ondansetron (ZOFRAN) IV, sodium chloride flush   Assessment/ Plan /Discussion.    1. Acute on chronic diastolic CHF: EF [id number], moderate diastolic dysfunction.Remains with marked volume overload still.  - Out 2.4 L and down another 8 lbs from yesterday.  - Continue Lasix to 20 mg/hr and metolazone 5 mg bid.  - Diuresed better yesterday. No role for HD currently. 2. CKD: Stage IV - Follow closely with diuresis.  - High dose diuretics working currently. - Creatinine relatively stable, only slightly up today.   - Creatinine 3.1>3.03>3.01>3.04 > 3.19 3. Atrial fibrillation: Paroxysmal, in NSR. No beta blocker with bradycardia. Continue warfarin. INR 2.35  4. CAD: s/p CABG, stable. Continue statin.  5. AS: Moderate on echo.  6. Mitral stenosis: Mild on echo.  7. Hypothyroidism:TSH 1.5 . Was on synthroid prior to admit. Continue 175 mcg synthroid. 8. HTN: BP remains mildly elevated. - Continue hydralazine 75 tid.   - Will resume amlodopine at 5 mg 9. Anemia: Iron stores low. Got Fe this admission.  10. Dementia 11. Deconditioning: PT following. Recommending SNF 12. Poor PO intake: Dietician consulted. Awaiting visit.  13. Cough - Family would like cough syrup scheduled so he doesn't have to keep asking for it. Will put in a scheduled  evening dose. - Family requesting "respiratory treatment". He does not have wheezing on exam nor Nebs/Albuterol at home.  Will discuss with MD.  14. Dysuria - Pt was complaining of burning urination prior to foley placement.  Will check UA.   PT recommends SNF  Satira Mccallum Tillery PA-C  09/17/2015 8:38 AM   Advanced Heart Failure Team Pager [phone] (M-F; 7a - 4p)  Please contact [facility] for night-coverage after hours (4p -7a ) and weekends on [email or web address]  Patient seen with PA, agree with the above note.  [name] continues to diurese well on current regimen and is losing weight.  He still has significant  volume overload on exam.  Continue current diuretic regimen today.  BUN is starting to rise.  If renal function continues to worsen and he remains volume overloaded, will need to move to HD.  Nephrology is following.   Start screening for SNF.   Loralie Champagne 09/17/2015 12:54 PM

## 2015-09-17 NOTE — NC FL2 (Signed)
[place] MEDICAID FL2 LEVEL OF CARE SCREENING TOOL     IDENTIFICATION  Patient Name: [name] Birthdate: [birth year] Sex: male Admission Date (Current Location): 09/09/2015  [facility] and [place] Number:  Herbalist and Address:  [facility], [place] [address], [place], [place] [address]      Provider Number: [id number]  Attending Physician Name and Address:  Belva Crome, MD  Relative Name and Phone Number:  [name], spouse, [phone]    Current Level of Care: Hospital Recommended Level of Care: [place] Prior Approval Number:    Date Approved/Denied:   PASRR Number: [id number]  Discharge Plan: SNF    Current Diagnoses: Patient Active Problem List   Diagnosis Date Noted  . Acute on chronic diastolic heart failure ([place]) 09/09/2015  . Decreased pedal pulses 06/03/2015  . Fall 03/24/2014  . Aftercare following surgery of the circulatory system, [place] 03/18/2014  . Chronic combined systolic and diastolic CHF (congestive heart failure) ([place]) 03/18/2014  . Hypertensive heart disease with heart failure ([place]) 03/18/2014  . CKD (chronic kidney disease), stage IV ([place]) 03/18/2014  . Hypothyroidism 03/18/2014  . Encounter for therapeutic drug monitoring 02/19/2014  . Atrial fibrillation ([place]) 02/13/2014  . Peripheral vascular disease, unspecified ([place]) 11/12/2013  . Occlusion and stenosis of carotid artery without mention of cerebral infarction 03/06/2012  . DOE (dyspnea on exertion) 02/13/2012  . Hyperlipidemia 08/30/2011  . CAD (coronary artery disease) 06/19/2007  . DM w/o Complication Type II [id number]    Orientation RESPIRATION BLADDER Height & Weight     Self, Place  O2 Continent, Indwelling catheter (Urinary catheter) Weight: 84.46 kg (186 lb 3.2 oz) (scale c) Height:  5\' 10"  (177.8 cm)  BEHAVIORAL SYMPTOMS/MOOD NEUROLOGICAL BOWEL NUTRITION STATUS      Continent  (Please see DC summary)  AMBULATORY STATUS  COMMUNICATION OF NEEDS Skin   Extensive Assist Verbally Normal                       Personal Care Assistance Level of Assistance  Bathing, Feeding, Dressing Bathing Assistance: Maximum assistance Feeding assistance: Independent Dressing Assistance: Limited assistance     Functional Limitations Info  Sight, Hearing Sight Info: Impaired Hearing Info: Impaired      SPECIAL CARE FACTORS FREQUENCY  OT (By licensed OT)     PT Frequency: min 3x/week OT Frequency: min 2x/week            Contractures      Additional Factors Info  Insulin Sliding Scale Code Status Info: Full Allergies Info: Betadine, Iodinated Diagnostic Agents, Latex   Insulin Sliding Scale Info: insulin aspart (novoLOG) injection 0-15 Units;insulin aspart (novoLOG) injection 0-5 Units;insulin glargine (LANTUS) injection 15 Units       Current Medications (09/17/2015):  This is the current hospital active medication list Current Facility-Administered Medications  Medication Dose Route Frequency Provider Last Rate Last Dose  . 0.9 %  sodium chloride infusion  250 mL Intravenous PRN Belva Crome, MD      . acetaminophen (TYLENOL) tablet 650 mg  650 mg Oral Q4H PRN Belva Crome, MD      . albuterol (PROVENTIL) (2.5 MG/3ML) 0.083% nebulizer solution 2.5 mg  2.5 mg Nebulization Q6H PRN Satira Mccallum Tillery, PA-C      . amLODipine (NORVASC) tablet 5 mg  5 mg Oral Daily Shirley Friar, PA-C   5 mg at 09/17/15 1124  . atorvastatin (LIPITOR) tablet 80 mg  80 mg Oral q1800 Belva Crome, MD   80 mg at 09/16/15 1753  . cyanocobalamin ((VITAMIN B-12)) injection 1,000 mcg  1,000 mcg Intramuscular Once Belva Crome, MD      . doxazosin (CARDURA) tablet 8 mg  8 mg Oral Daily Belva Crome, MD   8 mg at 09/17/15 1127  . ferumoxytol (FERAHEME) 510 mg in sodium chloride 0.9 % 100 mL IVPB  510 mg Intravenous Weekly Amy D Clegg, NP   510 mg at 09/14/15 1116  . furosemide (LASIX) 250 mg in dextrose 5 % 250 mL (1  mg/mL) infusion  20 mg/hr Intravenous Continuous Amy D Clegg, NP 20 mL/hr at 09/17/15 0609 20 mg/hr at 09/17/15 0609  . guaiFENesin (ROBITUSSIN) 100 MG/5ML solution 200 mg  10 mL Oral QHS Satira Mccallum Tillery, PA-C      . guaifenesin (ROBITUSSIN) 100 MG/5ML syrup 200 mg  200 mg Oral Q6H PRN Liliane Shi, PA-C   200 mg at 09/17/15 0033  . hydrALAZINE (APRESOLINE) tablet 75 mg  75 mg Oral 3 times per day Larey Dresser, MD   75 mg at 09/17/15 0615  . insulin aspart (novoLOG) injection 0-15 Units  0-15 Units Subcutaneous TID WC Belva Crome, MD   3 Units at 09/17/15 [phone]  . insulin aspart (novoLOG) injection 0-5 Units  0-5 Units Subcutaneous QHS Belva Crome, MD   0 Units at 09/09/15 2200  . insulin glargine (LANTUS) injection 15 Units  15 Units Subcutaneous Daily Belva Crome, MD   15 Units at 09/17/15 1127  . isosorbide mononitrate (IMDUR) 24 hr tablet 60 mg  60 mg Oral Daily Belva Crome, MD   60 mg at 09/17/15 1126  . levothyroxine (SYNTHROID, LEVOTHROID) tablet 175 mcg  175 mcg Oral QAC breakfast Conrad Vanleer, NP   175 mcg at 09/17/15 0615  . memantine (NAMENDA) tablet 5 mg  5 mg Oral Daily Belva Crome, MD   5 mg at 09/17/15 1125  . metolazone (ZAROXOLYN) tablet 5 mg  5 mg Oral BID Amy D Clegg, NP   5 mg at 09/17/15 1126  . ondansetron (ZOFRAN) injection 4 mg  4 mg Intravenous Q6H PRN Belva Crome, MD      . pantoprazole (PROTONIX) EC tablet 40 mg  40 mg Oral Q0600 Belva Crome, MD   40 mg at 09/17/15 [phone]  . potassium chloride SA (K-DUR,KLOR-CON) CR tablet 40 mEq  40 mEq Oral Daily Amy D Clegg, NP   40 mEq at 09/17/15 1125  . sodium chloride flush (NS) 0.9 % injection 3 mL  3 mL Intravenous Q12H Belva Crome, MD   3 mL at 09/16/15 2220  . sodium chloride flush (NS) 0.9 % injection 3 mL  3 mL Intravenous PRN Belva Crome, MD      . Warfarin - Pharmacist Dosing Inpatient   Does not apply q1800 Kem Parkinson, [place] at 09/17/15 1800     Discharge Medications: Please see  discharge summary for a list of discharge medications.  Relevant Imaging Results:  Relevant Lab Results:   Additional Information SSN: [place], [place]

## 2015-09-17 NOTE — Clinical Social Work Placement (Signed)
   CLINICAL SOCIAL WORK PLACEMENT  NOTE  Date:  09/17/2015  Patient Details  Name: [name] MRN: [id number] Date of Birth: 1934-04-22  Clinical Social Work is seeking post-discharge placement for this patient at the [place] level of care (*CSW will initial, date and re-position this form in  chart as items are completed):      Patient/family provided with [facility] Work Department's list of facilities offering this level of care within the geographic area requested by the patient (or if unable, by the patient's family).      Patient/family informed of their freedom to choose among providers that offer the needed level of care, that participate in Medicare, Medicaid or managed care program needed by the patient, have an available bed and are willing to accept the patient.      Patient/family informed of [facility]'s ownership interest in [facility] and [facility], as well as of the fact that they are under no obligation to receive care at these facilities.  PASRR submitted to EDS on 09/17/15     PASRR number received on 09/17/15     Existing PASRR number confirmed on       FL2 transmitted to all facilities in geographic area requested by pt/family on 09/17/15     FL2 transmitted to all facilities within larger geographic area on       Patient informed that his/her managed care company has contracts with or will negotiate with certain facilities, including the following:            Patient/family informed of bed offers received.  Patient chooses bed at       Physician recommends and patient chooses bed at      Patient to be transferred to   on  .  Patient to be transferred to facility by       Patient family notified on   of transfer.  Name of family member notified:        PHYSICIAN Please sign FL2     Additional Comment:    _______________________________________________ Benard Halsted, [place] 09/17/2015, 12:01  PM

## 2015-09-17 NOTE — Clinical Social Work Note (Signed)
Clinical Social Work Assessment  Patient Details  Name: [name] MRN: [id number] Date of Birth: [birth year]  Date of referral:  09/17/15               Reason for consult:  Facility Placement                Permission sought to share information with:  Facility Sport and exercise psychologist, Family Supports Permission granted to share information::  No (Patient is disoriented; Completed assessment with patient's wife)  Name::     [name]  Agency::  [facility]  Relationship::  Spouse  Contact Information:  [phone]  Housing/Transportation Living arrangements for the past 2 months:  Single Family Home Source of Information:  Spouse Patient Interpreter Needed:  None Criminal Activity/Legal Involvement Pertinent to Current Situation/Hospitalization:  No - Comment as needed Significant Relationships:  Adult Children, Spouse Lives with:  Spouse Do you feel safe going back to the place where you live?  No Need for family participation in patient care:  Yes (Comment)  Care giving concerns:  CSW received referral for possible SNF placement at time of discharge. CSW met with patient and patient's wife at bedside regarding PT recommendation of SNF placement at time of discharge. Patient is disoriented. Per patient's wife, patient's wife is currently unable to care for patient at their home given patient's current physical needs and fall risk. Patient's wife expressed understanding of PT recommendation and is agreeable to SNF placement at time of discharge. CSW to continue to follow and assist with discharge planning needs.   Social Worker assessment / plan:  Patient sits quietly while CSW speaks with his spouse. CSW spoke with patient's wife concerning possibility of rehab at [facility] before returning home.  Employment status:  Retired Forensic scientist:  Medicare PT Recommendations:  [place] / Referral to community resources:  [place]  Patient/Family's Response to care:  Patient's spouse recognizes need for rehab before returning home and is agreeable to a SNF in [place]. She stated she will ask patient's son to tour facilities if time. Patient's spouse questioned whether she or patient's son would be able to stay overnight at the facility. CSW will follow up regarding this concern.   Patient/Family's Understanding of and Emotional Response to Diagnosis, Current Treatment, and Prognosis:  Patient's spouse is realistic regarding therapy needs. No questions/concerns about plan or treatment.    Emotional Assessment Appearance:  Appears stated age Attitude/Demeanor/Rapport:  Unable to Assess Affect (typically observed):  Unable to Assess Orientation:  Oriented to Self, Oriented to Place Alcohol / Substance use:  Not Applicable Psych involvement (Current and /or in the community):  No (Comment)  Discharge Needs  Concerns to be addressed:  Care Coordination Readmission within the last 30 days:  No Current discharge risk:  None Barriers to Discharge:  Continued Medical Work up   [employer], [place] 09/17/2015, 11:33 AM

## 2015-09-17 NOTE — Progress Notes (Signed)
Initial Nutrition Assessment   INTERVENTION:  Provide Glucerna Shake po TID, each supplement provides 220 kcal and 10 grams of protein Provide Snacks TID Recommend liberalizing diet to Dysphagia 2 (soft chopped foods) Provide Multivitamin with minerals daily   NUTRITION DIAGNOSIS:   Inadequate oral intake related to poor appetite as evidenced by per patient/family report, meal completion < 50%.   GOAL:   Patient will meet greater than or equal to 90% of their needs   MONITOR:   PO intake, Supplement acceptance, Weight trends, Labs, I & O's, Skin  REASON FOR ASSESSMENT:   Consult Poor PO  ASSESSMENT:   82 y.o. male with history of T2DM who presents with complaint of 2-3 week history of lower extremity swelling, cough, and dyspnea.  Pt very lethargic at time of visit; wife at bedside provided most of information. Per wife, pt started eating poorly on Tuesday due to patient stating that he is just not hungry. She also reports seeing him chew on meats for 10-15 minutes due to pt only having 4 teeth. Pt denies any nausea or abdominal pain. Wife states that pt was eating well prior to Tuesday. Pt ate a few bites of pancakes and some peaches for breakfast which is more than he has been eating the past few days. RD emphasized the importance of nutrition and encouraged intake of snacks and nutritional supplements. Pt tried a couple sips of a Glucerna Shakes and likes it enough to drink it daily.   Labs: elevated glucose, low chloride, high BUN/creatinine, low calcium, low GFR, elevated phosphorus, high INR  Diet Order:  Diet heart healthy/carb modified Room service appropriate?: Yes; Fluid consistency:: Thin  Skin:  Reviewed, no issues  Last BM:  3/23  Height:   Ht Readings from Last 1 Encounters:  09/09/15 5\' 10"  (1.778 m)    Weight:   Wt Readings from Last 1 Encounters:  09/17/15 186 lb 3.2 oz (84.46 kg)    Ideal Body Weight:  75.5 kg  BMI:  Body mass index is 26.72  kg/(m^2).  Estimated Nutritional Needs:   Kcal:  1800-2000  Protein:  85-95 grams  Fluid:  1.8 L/day  EDUCATION NEEDS:   No education needs identified at this time  [place], LDN Inpatient Clinical Dietitian Pager: [phone] After Hours Pager: [phone]

## 2015-09-17 NOTE — Progress Notes (Signed)
[name] Progress Note   Assessment/ Plan:   1. Acute renal failure on chronic kidney disease stage IV: Hemodynamically mediated acute on chronic renal failure from CHF exacerbation-Continues to maintain a fair response to furosemide although he still has a long way to go to get to his "dry weight". It would be better for him to try and exploit this route then rush into dialysis (which appears to be imminent if BUN continues to worsen and if he gets uremic).  2. Acute exacerbation of diastolic heart failure: Continues to have fair response to furosemide drip/metolazone-will defer to cardiology regarding switching him from Lasix drip to bolus (currently 480 mg/24 hours).  3. Anemia of chronic kidney disease: With low iron saturation-getting intravenous iron . 4. Hypertension: blood pressure trends noted- appear to be improving with diuretics. 5. Paroxysmal atrial fibrillation: Currently on warfarin for CVA prophylaxis. He is in sinus rhythm at this time and cannot tolerate beta blockers due to bradycardia. 6. Poor oral intake/anorexia: Awaiting RD consult  Subjective:   No acute events overnight-wife is impressed with his weight loss and somewhat encouraged that he ate better this morning.    Objective:   BP 141/81 mmHg  Pulse 63  Temp(Src) 97.7 F (36.5 C) (Oral)  Resp 16  Ht 5\' 10"  (1.778 m)  Wt 84.46 kg (186 lb 3.2 oz)  BMI 26.72 kg/m2  SpO2 94%  Intake/Output Summary (Last 24 hours) at 09/17/15 0946 Last data filed at 09/17/15 0912  Gross per 24 hour  Intake    840 ml  Output   2151 ml  Net  -1311 ml   Weight change: -3.901 kg (-8 lb 9.6 oz)  Physical Exam: Gen: Comfortably sitting up in bed-1 word responses to questions. CVS: Pulse regular bradycardia, S1 and S2 normal Resp: Decreased inspiratory effort with fine rales left base Abd: Soft, obese, nontender Ext: 2+ lower extremity edema.  Imaging: No results found.  Labs: BMET  Recent Labs Lab  09/11/15 0527 09/12/15 0448 09/13/15 0258 09/14/15 0343 09/15/15 0441 09/16/15 0534 09/17/15 0540  NA 138 137 136 137 133* 138 136  K 4.0 4.1 4.4 4.1 3.8 3.9 3.8  CL 103 102 98* 102 99* 94* 95*  CO2 24 26 25 25 26 27 30   GLUCOSE 108* 188* 128* 136* 170* 211* 207*  BUN 82* 82* 84* 84* 82* 87* 93*  CREATININE 3.15* 2.97* 3.19* 3.03* 3.01* 3.04* 3.19*  CALCIUM 8.6* 8.3* 8.5* 8.5* 8.4* 8.8* 8.8*  PHOS  --   --   --   --   --   --  5.3*   CBC  Recent Labs Lab 09/12/15 0448 09/15/15 0441  WBC 5.1 7.1  HGB 10.1* 10.4*  HCT 31.8* 34.1*  MCV 89.6 88.6  PLT 120* 120*    Medications:    . amLODipine  5 mg Oral Daily  . atorvastatin  80 mg Oral q1800  . cyanocobalamin  1,000 mcg Intramuscular Once  . doxazosin  8 mg Oral Daily  . ferumoxytol  510 mg Intravenous Weekly  . guaiFENesin  10 mL Oral QHS  . hydrALAZINE  75 mg Oral 3 times per day  . insulin aspart  0-15 Units Subcutaneous TID WC  . insulin aspart  0-5 Units Subcutaneous QHS  . insulin glargine  15 Units Subcutaneous Daily  . isosorbide mononitrate  60 mg Oral Daily  . levothyroxine  175 mcg Oral QAC breakfast  . memantine  5 mg Oral Daily  . metolazone  5 mg Oral BID  . pantoprazole  40 mg Oral Q0600  . potassium chloride  40 mEq Oral Daily  . sodium chloride flush  3 mL Intravenous Q12H  . Warfarin - Pharmacist Dosing Inpatient   Does not apply [id number]   Elmarie Shiley, MD 09/17/2015, 9:46 AM

## 2015-09-17 NOTE — Progress Notes (Signed)
Inpatient Diabetes Program Recommendations  AACE/ADA: New Consensus Statement on Inpatient Glycemic Control (2015)  Target Ranges:  Prepandial:   less than 140 mg/dL      Peak postprandial:   less than 180 mg/dL (1-2 hours)      Critically ill patients:  140 - 180 mg/dL   Review of Glycemic Control:  Results for YUJI, GISMONDI (MRN [id number]) as of 09/17/2015 12:43  Ref. Range 09/16/2015 10:52 09/16/2015 16:29 09/16/2015 21:10 09/17/2015 06:20 09/17/2015 12:05  Glucose-Capillary Latest Ref Range: 65-99 mg/dL 182 (H) 198 (H) 172 (H) 175 (H) 224 (H)   Diabetes history: Type 2 diabetes Outpatient Diabetes medications:  Novolog moderate tid and HS Current orders for Inpatient glycemic control:  Lantus 15 units q AM, Novolog 10 units bid with meals  Inpatient Diabetes Program Recommendations:    Please consider adding Novolog meal coverage 4 units tid with meals.    Thanks, Adah Perl, RN, BC-ADM Inpatient Diabetes Coordinator Pager [phone] (8a-5p)

## 2015-09-17 NOTE — Progress Notes (Signed)
OT NOTE  MD: Pt could benefit from air mattress overlay or turns every 2 hours due to inability to complete bed mobility without (A) and max cues.  Pt demonstrates 86% oxygen saturations on 2 L supine on arrival. Question need for respiratory therapy consult. Pt producing secretion upon sitting EOB.  Wife reports patient with baseline dementia and she states "it doesn't say he has dementia but he does." Wife reports requiring cues to complete transfers PTA. Wife reports patient with increased confusion compared to baseline. Question decrease oxygen level contributing to this ?   Jeri Modena   OTR/L Pager: [phone] Office: [phone] .

## 2015-09-17 NOTE — Evaluation (Addendum)
Occupational Therapy Evaluation Patient Details Name: [name] MRN: [id number] DOB: [birth year] Today's Date: 09/17/2015    History of Present Illness [name] is a 82 y.o. male with complaint of 2-3 week history of lower extremity swelling, cough, dyspnea, and recent visit with primary physician, Dr. Philip [name] who informed the patient and his daughter that he was in heart failure.  pt with fistula for anticipated HD   Clinical Impression   PT admitted with CHF. Pt currently with functional limitiations due to the deficits listed below (see OT problem list). PTA living at home with wife with (A) for adls but able to transfer. Pt used RW for transfers.  Pt will benefit from skilled OT to increase their independence and safety with adls and balance to allow discharge SNF. Pt demonstrates decr oxygen saturations during session: Supine 86% 2 L on arrival Sitting 88-89% 3 L  Sit<>Stand and return supine 90% on 4 L Rn made aware. See separate note concerning need for mattress overlay or Q2 turns due to decr bed mobility and high risk skin break down.      Follow Up Recommendations  SNF;Supervision/Assistance - 24 hour    Equipment Recommendations  3 in 1 bedside comode;Wheelchair (measurements OT);Wheelchair cushion (measurements OT);Hospital bed;Other (comment) (air mattress overlay)    Recommendations for Other Services  Respiratory therapy      Precautions / Restrictions Precautions Precautions: Fall Precaution Comments: watch oxygen Restrictions Weight Bearing Restrictions: No      Mobility Bed Mobility Overal bed mobility: Needs Assistance Bed Mobility: Supine to Sit;Sit to Supine     Supine to sit: +2 for physical assistance;Min assist Sit to supine: +2 for physical assistance;Mod assist   General bed mobility comments: required (A) with bil LE in and out of bed. pt able to initiate OOB with decr (A) required  Transfers Overall transfer level: Needs  assistance Equipment used: Rolling walker (2 wheeled) Transfers: Sit to/from Stand Sit to Stand: +2 physical assistance;Mod assist         General transfer comment: cues for sequence    Balance Overall balance assessment: Needs assistance Sitting-balance support: Bilateral upper extremity supported;Feet supported Sitting balance-Leahy Scale: Poor     Standing balance support: Bilateral upper extremity supported;During functional activity Standing balance-Leahy Scale: Poor                              ADL Overall ADL's : Needs assistance/impaired   Eating/Feeding Details (indicate cue type and reason): wife reports poor PO intake. wife reports patient with 4 teeth and food options have been difficult at times for patient to chew. Grooming: Wash/dry hands;Wash/dry face;Min guard;Sitting Grooming Details (indicate cue type and reason): max cues and cues to complete task with detail             Lower Body Dressing: Maximal assistance;Sit to/from stand Lower Body Dressing Details (indicate cue type and reason): don mesh panties with pad due incontinence in previous session             Functional mobility during ADLs: +2 for physical assistance;Maximal assistance;Rolling walker General ADL Comments: Pt requires cues to  OOB transfer to EOB. pt with decr initiation. pt complete basic transfer and able to static stand. Pt noted to have decr oxygen saturations throughout session see above. pt reports pain all over but without detail. Pt positioned in bed with towel roll between scapula to help with upright posture , repositioned  high in the bed and wife edcuated to remove towel roll in 30 minutes     Vision Additional Comments: not tested at this time but patient scanning to locate therapist when on L side due to hearing better on L side.    Perception     Praxis      Pertinent Vitals/Pain Pain Assessment: Faces Faces Pain Scale: Hurts whole lot Pain Location:  whole body Pain Intervention(s): Monitored during session;Repositioned     Hand Dominance Right   Extremity/Trunk Assessment Upper Extremity Assessment Upper Extremity Assessment: Generalized weakness   Lower Extremity Assessment Lower Extremity Assessment: Defer to PT evaluation   Cervical / Trunk Assessment Cervical / Trunk Assessment: Kyphotic   Communication Communication Communication: HOH (deaf R ear and does not wear hearing aid on L side)   Cognition Arousal/Alertness: Lethargic Behavior During Therapy: Flat affect Overall Cognitive Status: History of cognitive impairments - at baseline                     General Comments       Exercises       Shoulder Instructions      Home Living Family/patient expects to be discharged to:: Private residence Living Arrangements: Spouse/significant other Available Help at Discharge: Family Type of Home: House Home Access: Stairs to enter [employer] of Steps: pt has 3 steps with both rails and able to do it prior with wifes SBA only   Home Layout: One level                   Additional Comments: pt was independent wiht toileting prior with RW. he had recent fall in bathroom.  Son li ves nearby but works      Prior Functioning/Environment Level of Independence: Independent with assistive device(s)             OT Diagnosis: Generalized weakness;Cognitive deficits   OT Problem List: Decreased strength;Decreased activity tolerance;Impaired balance (sitting and/or standing);Decreased safety awareness;Decreased knowledge of use of DME or AE;Decreased knowledge of precautions;Cardiopulmonary status limiting activity;Decreased cognition   OT Treatment/Interventions: Self-care/ADL training;Therapeutic exercise;Neuromuscular education;DME and/or AE instruction;Therapeutic activities;Cognitive remediation/compensation;Patient/family education;Balance training    OT Goals(Current goals can be found  in the care plan section) Acute Rehab OT Goals Patient Stated Goal: none stated OT Goal Formulation: With family Potential to Achieve Goals: Good  OT Frequency: Min 2X/week   Barriers to D/C: Other (comment)  wife unable to (A) at current level       Co-evaluation PT/OT/SLP Co-Evaluation/Treatment: Yes Reason for Co-Treatment: Complexity of the patient's impairments (multi-system involvement);For patient/therapist safety   OT goals addressed during session: ADL's and self-care;Strengthening/ROM      End of Session Equipment Utilized During Treatment: Gait belt;Rolling walker;Oxygen Nurse Communication: Mobility status;Precautions  Activity Tolerance: Patient tolerated treatment well Patient left: in bed;with call bell/phone within reach;with bed alarm set;with nursing/sitter in room;with family/visitor present   Time: [id number] OT Time Calculation (min): 25 min Charges:  OT General Charges $OT Visit: 1 Procedure OT Evaluation $OT Eval High Complexity: 1 Procedure G-Codes:    Peri Maris 10/05/2015, 10:34 AM  Jeri Modena   OTR/L [phone] Office: [phone] .

## 2015-09-18 ENCOUNTER — Inpatient Hospital Stay (HOSPITAL_COMMUNITY): Payer: Medicare Other

## 2015-09-18 DIAGNOSIS — I25768 Atherosclerosis of bypass graft of coronary artery of transplanted heart with other forms of angina pectoris: Secondary | ICD-10-CM

## 2015-09-18 DIAGNOSIS — I10 Essential (primary) hypertension: Secondary | ICD-10-CM

## 2015-09-18 DIAGNOSIS — I050 Rheumatic mitral stenosis: Secondary | ICD-10-CM

## 2015-09-18 LAB — GLUCOSE, CAPILLARY
Glucose-Capillary: 167 mg/dL — ABNORMAL HIGH (ref 65–99)
Glucose-Capillary: 208 mg/dL — ABNORMAL HIGH (ref 65–99)
Glucose-Capillary: 130 mg/dL — ABNORMAL HIGH (ref 65–99)

## 2015-09-18 LAB — CBC
HCT: 35.8 % — ABNORMAL LOW (ref 39.0–52.0)
HEMOGLOBIN: 11 g/dL — AB (ref 13.0–17.0)
MCH: 27.5 pg (ref 26.0–34.0)
MCHC: 30.7 g/dL (ref 30.0–36.0)
MCV: 89.5 fL (ref 78.0–100.0)
Platelets: 136 10*3/uL — ABNORMAL LOW (ref 150–400)
RBC: 4 MIL/uL — ABNORMAL LOW (ref 4.22–5.81)
RDW: 15.9 % — ABNORMAL HIGH (ref 11.5–15.5)
WBC: 16.3 10*3/uL — ABNORMAL HIGH (ref 4.0–10.5)

## 2015-09-18 LAB — BASIC METABOLIC PANEL
Anion gap: 13 (ref 5–15)
BUN: 103 mg/dL — AB (ref 6–20)
CHLORIDE: 93 mmol/L — AB (ref 101–111)
CO2: 29 mmol/L (ref 22–32)
CREATININE: 3.54 mg/dL — AB (ref 0.61–1.24)
Calcium: 8.8 mg/dL — ABNORMAL LOW (ref 8.9–10.3)
GFR calc Af Amer: 17 mL/min — ABNORMAL LOW (ref >60)
GFR calc non Af Amer: 15 mL/min — ABNORMAL LOW (ref >60)
Glucose, Bld: 165 mg/dL — ABNORMAL HIGH (ref 65–99)
Potassium: 4 mmol/L (ref 3.5–5.1)
SODIUM: 135 mmol/L (ref 135–145)

## 2015-09-18 LAB — PROTIME-INR
INR: 3.87 — AB (ref 0.00–1.49)
PROTHROMBIN TIME: 37.1 s — AB (ref 11.6–15.2)

## 2015-09-18 MED ORDER — LIDOCAINE HCL (PF) 1 % IJ SOLN: 5.0000 mL | INTRAMUSCULAR | Status: DC | PRN

## 2015-09-18 MED ORDER — SODIUM CHLORIDE 0.9 % IV SOLN: 100.0000 mL | INTRAVENOUS | Status: DC | PRN

## 2015-09-18 MED ORDER — PENTAFLUOROPROP-TETRAFLUOROETH EX AERO: 1.0000 "application " | INHALATION_SPRAY | CUTANEOUS | Status: DC | PRN

## 2015-09-18 MED ORDER — FUROSEMIDE 10 MG/ML IJ SOLN
120.0000 mg | Freq: Two times a day (BID) | INTRAVENOUS | Status: DC
  Administered 2015-09-18 – 2015-09-19 (×3): 120 mg via INTRAVENOUS
  Filled 2015-09-18 (×5): qty 12

## 2015-09-18 MED ORDER — LIDOCAINE-PRILOCAINE 2.5-2.5 % EX CREA
1.0000 "application " | TOPICAL_CREAM | CUTANEOUS | Status: DC | PRN
  Filled 2015-09-18: qty 5

## 2015-09-18 MED ORDER — ALTEPLASE 2 MG IJ SOLR
2.0000 mg | Freq: Once | INTRAMUSCULAR | Status: DC | PRN
  Filled 2015-09-18: qty 2

## 2015-09-18 MED ORDER — HEPARIN SODIUM (PORCINE) 1000 UNIT/ML DIALYSIS
1000.0000 [IU] | INTRAMUSCULAR | Status: DC | PRN
  Filled 2015-09-18: qty 1

## 2015-09-18 MED ORDER — HEPARIN SODIUM (PORCINE) 1000 UNIT/ML DIALYSIS
40.0000 [IU]/kg | INTRAMUSCULAR | Status: DC | PRN
  Filled 2015-09-18: qty 4

## 2015-09-18 NOTE — Progress Notes (Signed)
ANTICOAGULATION Consult Note - Follow up consult  Pharmacy Consult for Coumadin  Indication: atrial fibrillation  Allergies  Allergen Reactions  . Betadine [Povidone Iodine] Rash  . Iodinated Diagnostic Agents Rash and Other (See Comments)    Does fine with premedications for cath  . Latex Hives    Patient Measurements: Height: 5\' 10"  (177.8 cm) Weight: 190 lb 11.2 oz (86.5 kg) (scale C, reweight for confirmation) IBW/kg (Calculated) : 73  Vital Signs: Temp: 98 F (36.7 C) (03/25 0631) Temp Source: Oral (03/25 0631) BP: 154/52 mmHg (03/25 0631) Pulse Rate: 68 (03/25 0631)  Labs:  Recent Labs  09/16/15 0534 09/17/15 0540 09/18/15 0337  HGB  --   --  11.0*  HCT  --   --  35.8*  PLT  --   --  136*  LABPROT 25.5* 32.5* 37.1*  INR 2.35* 3.25* 3.87*  CREATININE 3.04* 3.19* 3.54*    Estimated Creatinine Clearance: 17.5 mL/min (by C-G formula based on Cr of 3.54).   Assessment: 21 yom admitted 09/09/2015 with SOB. Patient on coumadin PTA for AFib. Pharmacy consulted to continue dosing inpatient.   INR remains supratherapeutic after jump from 2.35 to 3.25 following 5mg  dose given 3/23. Coumadin held since. INR today 3.87. CBC stable, no bleeding reported.   On amiodarone PTA which has been stopped due to bradycardia.    PTA coumadin dose = 2.5mg  daily except 5mg  on Th and Su. Last dose 3/15; confirmed w/ pt  Goal of Therapy:  INR 2-3 Monitor platelets by anticoagulation protocol: Yes   Plan:  1) No coumadin tonight 2) Daily INR 3) Monitor CBC, s/sx bleeding  Nena Jordan, PharmD, BCPS 09/18/2015 11:44 AM

## 2015-09-18 NOTE — Progress Notes (Signed)
[facility] Progress Note   Assessment/ Plan:   1. Acute renal failure on chronic kidney disease stage IV: Hemodynamically mediated acute on chronic renal failure from CHF exacerbation. Initially responsive to escalation of diuretic dose but now response has tailed off with worsening renal function. We'll discontinue Lasix drip and switch him over to bolus furosemide twice a day, discontinue metolazone. Start hemodialysis with ultrafiltration today. 2. Acute exacerbation of diastolic heart failure: Initially with good response to furosemide/metolazone-now appears to be tailing off with worsening renal function, begin weaning off furosemide and start hemodialysis/ultrafiltration today..  3. Anemia of chronic kidney disease: Status post intravenous iron for iron deficiency with hemoglobin acceptable limits. Not on Aranesp at this time . 4. Hypertension: Intermittently elevated blood pressures, anticipated to improve with ultrafiltration and hemodialysis. 5. Paroxysmal atrial fibrillation: Currently on warfarin for CVA prophylaxis. He is in sinus rhythm at this time and cannot tolerate beta blockers due to bradycardia. 6. Poor oral intake/anorexia: Awaiting RD consult  Subjective:   Had leakage of urine around his Foley catheter last night-slept poorly and currently appears tired.    Objective:   BP 154/52 mmHg  Pulse 68  Temp(Src) 98 F (36.7 C) (Oral)  Resp 22  Ht 5\' 10"  (1.778 m)  Wt 86.5 kg (190 lb 11.2 oz)  BMI 27.36 kg/m2  SpO2 88%  Intake/Output Summary (Last 24 hours) at 09/18/15 0948 Last data filed at 09/18/15 [id number]  Gross per 24 hour  Intake    974 ml  Output    601 ml  Net    373 ml   Weight change:   Physical Exam: Gen: Comfortably sitting up in bed-Wife and son are at bedside. CVS: Pulse regular bradycardia, S1 and S2 normal Resp: Decreased inspiratory effort with fine rales left base Abd: Soft, obese, nontender Ext: 2+ lower extremity edema.Left  brachiocephalic fistula is tortuous but with good thrill and bruit  Imaging: No results found.  Labs: BMET  Recent Labs Lab 09/12/15 0448 09/13/15 0258 09/14/15 0343 09/15/15 0441 09/16/15 0534 09/17/15 0540 09/18/15 0337  NA 137 136 137 133* 138 136 135  K 4.1 4.4 4.1 3.8 3.9 3.8 4.0  CL 102 98* 102 99* 94* 95* 93*  CO2 26 25 25 26 27 30 29   GLUCOSE 188* 128* 136* 170* 211* 207* 165*  BUN 82* 84* 84* 82* 87* 93* 103*  CREATININE 2.97* 3.19* 3.03* 3.01* 3.04* 3.19* 3.54*  CALCIUM 8.3* 8.5* 8.5* 8.4* 8.8* 8.8* 8.8*  PHOS  --   --   --   --   --  5.3*  --    CBC  Recent Labs Lab 09/12/15 0448 09/15/15 0441 09/18/15 0337  WBC 5.1 7.1 16.3*  HGB 10.1* 10.4* 11.0*  HCT 31.8* 34.1* 35.8*  MCV 89.6 88.6 89.5  PLT 120* 120* 136*    Medications:    . amLODipine  5 mg Oral Daily  . atorvastatin  80 mg Oral q1800  . benzonatate  100 mg Oral BID  . cyanocobalamin  1,000 mcg Intramuscular Once  . doxazosin  8 mg Oral Daily  . feeding supplement (GLUCERNA SHAKE)  237 mL Oral TID BM  . ferumoxytol  510 mg Intravenous Weekly  . furosemide  120 mg Intravenous BID  . hydrALAZINE  75 mg Oral 3 times per day  . insulin aspart  0-15 Units Subcutaneous TID WC  . insulin aspart  0-5 Units Subcutaneous QHS  . insulin glargine  15 Units Subcutaneous Daily  .  isosorbide mononitrate  60 mg Oral Daily  . levothyroxine  175 mcg Oral QAC breakfast  . memantine  5 mg Oral Daily  . pantoprazole  40 mg Oral Q0600  . potassium chloride  40 mEq Oral Daily  . sodium chloride flush  3 mL Intravenous Q12H  . Warfarin - Pharmacist Dosing Inpatient   Does not apply [id number]   Elmarie Shiley, MD 09/18/2015, 9:48 AM

## 2015-09-18 NOTE — Progress Notes (Signed)
SUBJECTIVE: Denies chest pain and shortness of breath.     Intake/Output Summary (Last 24 hours) at 09/18/15 1204 Last data filed at 09/18/15 1021  Gross per 24 hour  Intake    974 ml  Output    401 ml  Net    573 ml    Current Facility-Administered Medications  Medication Dose Route Frequency Provider Last Rate Last Dose  . 0.9 %  sodium chloride infusion  250 mL Intravenous PRN Belva Crome, MD      . acetaminophen (TYLENOL) tablet 650 mg  650 mg Oral Q4H PRN Belva Crome, MD      . albuterol (PROVENTIL) (2.5 MG/3ML) 0.083% nebulizer solution 2.5 mg  2.5 mg Nebulization Q6H PRN Satira Mccallum Tillery, PA-C      . amLODipine (NORVASC) tablet 5 mg  5 mg Oral Daily Shirley Friar, PA-C   5 mg at 09/18/15 1032  . atorvastatin (LIPITOR) tablet 80 mg  80 mg Oral q1800 Belva Crome, MD   80 mg at 09/17/15 1752  . benzonatate (TESSALON) capsule 100 mg  100 mg Oral BID Satira Mccallum Tillery, PA-C   100 mg at 09/18/15 1031  . cyanocobalamin ((VITAMIN B-12)) injection 1,000 mcg  1,000 mcg Intramuscular Once Belva Crome, MD      . doxazosin (CARDURA) tablet 8 mg  8 mg Oral Daily Belva Crome, MD   8 mg at 09/18/15 1031  . feeding supplement (GLUCERNA SHAKE) (GLUCERNA SHAKE) liquid 237 mL  237 mL Oral TID BM Larey Dresser, MD   237 mL at 09/18/15 1142  . ferumoxytol (FERAHEME) 510 mg in sodium chloride 0.9 % 100 mL IVPB  510 mg Intravenous Weekly Amy D Clegg, NP   510 mg at 09/14/15 1116  . furosemide (LASIX) 120 mg in dextrose 5 % 50 mL IVPB  120 mg Intravenous BID Elmarie Shiley, MD   120 mg at 09/18/15 1142  . guaifenesin (ROBITUSSIN) 100 MG/5ML syrup 200 mg  200 mg Oral Q6H PRN Liliane Shi, PA-C   200 mg at 09/18/15 1035  . hydrALAZINE (APRESOLINE) tablet 75 mg  75 mg Oral 3 times per day Larey Dresser, MD   75 mg at 09/18/15 0630  . insulin aspart (novoLOG) injection 0-15 Units  0-15 Units Subcutaneous TID WC Belva Crome, MD   3 Units at 09/18/15 [phone]  . insulin  aspart (novoLOG) injection 0-5 Units  0-5 Units Subcutaneous QHS Belva Crome, MD   0 Units at 09/09/15 2200  . insulin glargine (LANTUS) injection 15 Units  15 Units Subcutaneous Daily Belva Crome, MD   15 Units at 09/18/15 1032  . isosorbide mononitrate (IMDUR) 24 hr tablet 60 mg  60 mg Oral Daily Belva Crome, MD   60 mg at 09/18/15 1031  . levothyroxine (SYNTHROID, LEVOTHROID) tablet 175 mcg  175 mcg Oral QAC breakfast Amy D Clegg, NP   175 mcg at 09/18/15 0630  . memantine (NAMENDA) tablet 5 mg  5 mg Oral Daily Belva Crome, MD   5 mg at 09/18/15 1031  . ondansetron (ZOFRAN) injection 4 mg  4 mg Intravenous Q6H PRN Belva Crome, MD      . pantoprazole (PROTONIX) EC tablet 40 mg  40 mg Oral Q0600 Belva Crome, MD   40 mg at 09/18/15 0630  . potassium chloride SA (K-DUR,KLOR-CON) CR tablet 40 mEq  40 mEq Oral Daily Amy  Estrella Deeds, NP   40 mEq at 09/18/15 1032  . sodium chloride flush (NS) 0.9 % injection 3 mL  3 mL Intravenous Q12H Belva Crome, MD   3 mL at 09/17/15 2220  . sodium chloride flush (NS) 0.9 % injection 3 mL  3 mL Intravenous PRN Belva Crome, MD      . Warfarin - Pharmacist Dosing Inpatient   Does not apply q1800 Andi Devon Combs, [place] at 09/17/15 1800    Filed Vitals:   09/17/15 1958 09/18/15 0631 09/18/15 0727 09/18/15 0741  BP: 144/44 154/52    Pulse: 64 68    Temp: 97.7 F (36.5 C) 98 F (36.7 C)    TempSrc: Oral Oral    Resp: 20 22    Height:      Weight:   183 lb 1.6 oz (83.054 kg) 190 lb 11.2 oz (86.5 kg)  SpO2: 94% 88%      PHYSICAL EXAM General: Chronically ill appearing, elderly. NAD. HEENT: Normal. Neck: Elevated JVP.  Lungs: Diminished throughout with rales b/l. CV: Nondisplaced PMI.  Regular rate and rhythm, normal S1/S2, no [id number], 2/6 systolic murmur over RUSB.  1-2+ pitting pretibial edema.    Abdomen: Soft, nontender.  Neurologic: Alert.  Psych: Normal affect. Musculoskeletal: No gross deformities. Extremities: No clubbing or  cyanosis.   TELEMETRY: Reviewed telemetry pt in sinus rhythm.  LABS: Basic Metabolic Panel:  Recent Labs  09/17/15 0540 09/18/15 0337  NA 136 135  K 3.8 4.0  CL 95* 93*  CO2 30 29  GLUCOSE 207* 165*  BUN 93* 103*  CREATININE 3.19* 3.54*  CALCIUM 8.8* 8.8*  MG 2.2  --   PHOS 5.3*  --    Liver Function Tests:  Recent Labs  09/17/15 0540  ALBUMIN 2.9*   No results for input(s): LIPASE, AMYLASE in the last 72 hours. CBC:  Recent Labs  09/18/15 0337  WBC 16.3*  HGB 11.0*  HCT 35.8*  MCV 89.5  PLT 136*   Cardiac Enzymes: No results for input(s): CKTOTAL, CKMB, CKMBINDEX, TROPONINI in the last 72 hours. BNP: Invalid input(s): POCBNP D-Dimer: No results for input(s): DDIMER in the last 72 hours. Hemoglobin A1C: No results for input(s): HGBA1C in the last 72 hours. Fasting Lipid Panel: No results for input(s): CHOL, HDL, LDLCALC, TRIG, CHOLHDL, LDLDIRECT in the last 72 hours. Thyroid Function Tests: No results for input(s): TSH, T4TOTAL, T3FREE, THYROIDAB in the last 72 hours.  Invalid input(s): FREET3 Anemia Panel: No results for input(s): VITAMINB12, FOLATE, FERRITIN, TIBC, IRON, RETICCTPCT in the last 72 hours.  RADIOLOGY: Dg Chest 2 View  09/10/2015  CLINICAL DATA:  Congestive heart failure. EXAM: CHEST  2 VIEW COMPARISON:  09/09/2015 FINDINGS: Previous median sternotomy and CABG procedure. The heart size appears normal. There is moderate diffuse interstitial edema throughout both lungs, unchanged from previous exam. Small pleural effusions are new from the previous study. IMPRESSION: 1. Persistent changes of CHF. 2. New bilateral pleural effusions. Electronically Signed   By: Kerby Moors M.D.   On: 09/10/2015 09:02   Dg Chest 2 View  09/09/2015  CLINICAL DATA:  Cough and shortness of breath EXAM: CHEST  2 VIEW COMPARISON:  March 23, 2014 FINDINGS: No pneumothorax. Mild cardiomegaly. The hila and mediastinum are unchanged. Mild pulmonary edema is  identified. No other acute abnormalities. IMPRESSION: Pulmonary edema. Electronically Signed   By: Dorise Bullion III M.D   On: 09/09/2015 13:39   Dg Chest Port 1 [address]  09/13/2015  CLINICAL DATA:  82 year old male with history of dyspnea. EXAM: PORTABLE CHEST 1 VIEW COMPARISON:  Chest x-ray 09/10/2015. FINDINGS: There is cephalization of the pulmonary vasculature, indistinctness of the interstitial markings, and patchy airspace disease throughout the lungs bilaterally suggestive of moderate pulmonary edema. Small bilateral pleural effusions. Lung volumes are low. No pneumothorax. Mild cardiomegaly. Upper mediastinal contours are within normal limits. Atherosclerosis in the thoracic aorta. Status post median sternotomy for CABG. IMPRESSION: 1. The appearance of the chest suggests worsening congestive heart failure, as above. 2. Atherosclerosis. Electronically Signed   By: Vinnie Langton M.D.   On: 09/13/2015 16:17      ASSESSMENT AND PLAN: 1. Acute on chronic diastolic CHF: [id number], moderate diastolic dysfunction.Remains with marked volume overload still.  - nephrology has seen and plans to bolus Lasix BID and initiate HD with ultrafiltration. - will obtain chest xray.  2. Atrial fibrillation: Paroxysmal, in NSR. No beta blocker with bradycardia. Continue warfarin. INR 3.87   3. CAD: s/p CABG, stable. Continue statin and Imdur.   4. AS: Moderate on echo.   5. Mitral stenosis: Mild on echo.   6. Hypothyroidism:TSH 1.5 . Was on Synthroid prior to admit. Continue 175 mcg synthroid.  7. Essential HTN: BP remains mildly elevated. - Continue hydralazine 75 tid. - Continue amlodopine at 5 mg.  8. Anemia: Iron stores low. Got Fe this admission.   9. CKD stage IV: HD with ultrafiltration today.   Kate Sable, M.D., F.A.C.C.

## 2015-09-19 ENCOUNTER — Encounter (HOSPITAL_COMMUNITY): Payer: Self-pay | Admitting: General Practice

## 2015-09-19 ENCOUNTER — Inpatient Hospital Stay (HOSPITAL_COMMUNITY): Payer: Medicare Other

## 2015-09-19 LAB — POCT I-STAT 3, ART BLOOD GAS (G3+)
Acid-Base Excess: 2 mmol/L (ref 0.0–2.0)
Bicarbonate: 29.8 mEq/L — ABNORMAL HIGH (ref 20.0–24.0)
O2 Saturation: 96 %
PH ART: 7.313 — AB (ref 7.350–7.450)
TCO2: 32 mmol/L (ref 0–100)
pCO2 arterial: 58.7 mmHg (ref 35.0–45.0)
pO2, Arterial: 92 mmHg (ref 80.0–100.0)

## 2015-09-19 LAB — GLUCOSE, CAPILLARY
GLUCOSE-CAPILLARY: 149 mg/dL — AB (ref 65–99)
GLUCOSE-CAPILLARY: 155 mg/dL — AB (ref 65–99)
GLUCOSE-CAPILLARY: 167 mg/dL — AB (ref 65–99)
Glucose-Capillary: 164 mg/dL — ABNORMAL HIGH (ref 65–99)

## 2015-09-19 LAB — HEPATITIS B SURFACE ANTIGEN: Hepatitis B Surface Ag: NEGATIVE

## 2015-09-19 LAB — BASIC METABOLIC PANEL
ANION GAP: 12 (ref 5–15)
BUN: 79 mg/dL — ABNORMAL HIGH (ref 6–20)
CALCIUM: 8.8 mg/dL — AB (ref 8.9–10.3)
CO2: 28 mmol/L (ref 22–32)
Chloride: 97 mmol/L — ABNORMAL LOW (ref 101–111)
Creatinine, Ser: 3.68 mg/dL — ABNORMAL HIGH (ref 0.61–1.24)
GFR, EST AFRICAN AMERICAN: 17 mL/min — AB (ref >60)
GFR, EST NON AFRICAN AMERICAN: 14 mL/min — AB (ref >60)
Glucose, Bld: 139 mg/dL — ABNORMAL HIGH (ref 65–99)
POTASSIUM: 4.9 mmol/L (ref 3.5–5.1)
SODIUM: 137 mmol/L (ref 135–145)

## 2015-09-19 LAB — BLOOD GAS, ARTERIAL
Acid-Base Excess: 3.1 mmol/L — ABNORMAL HIGH (ref 0.0–2.0)
BICARBONATE: 29.3 meq/L — AB (ref 20.0–24.0)
DRAWN BY: 105521
FIO2: 1
O2 Saturation: 98.4 %
PCO2 ART: 64.4 mmHg — AB (ref 35.0–45.0)
PH ART: 7.281 — AB (ref 7.350–7.450)
PO2 ART: 134 mmHg — AB (ref 80.0–100.0)
Patient temperature: 98.6
TCO2: 31.3 mmol/L (ref 0–100)

## 2015-09-19 LAB — PROTIME-INR
INR: 4.56 — AB (ref 0.00–1.49)
Prothrombin Time: 41.9 seconds — ABNORMAL HIGH (ref 11.6–15.2)

## 2015-09-19 LAB — HEPATITIS B SURFACE ANTIBODY,QUALITATIVE: Hep B S Ab: NONREACTIVE

## 2015-09-19 LAB — HEPATITIS B CORE ANTIBODY, TOTAL: Hep B Core Total Ab: NEGATIVE

## 2015-09-19 LAB — MRSA PCR SCREENING: MRSA by PCR: NEGATIVE

## 2015-09-19 MED ORDER — LIDOCAINE HCL (PF) 1 % IJ SOLN: 5.0000 mL | INTRAMUSCULAR | Status: DC | PRN

## 2015-09-19 MED ORDER — TORSEMIDE 100 MG PO TABS
100.0000 mg | ORAL_TABLET | Freq: Two times a day (BID) | ORAL | Status: DC
  Filled 2015-09-19 (×2): qty 1

## 2015-09-19 MED ORDER — PENTAFLUOROPROP-TETRAFLUOROETH EX AERO
1.0000 | INHALATION_SPRAY | CUTANEOUS | Status: DC | PRN
Start: 2015-09-19 — End: 2015-09-29

## 2015-09-19 MED ORDER — FUROSEMIDE 10 MG/ML IJ SOLN
10.0000 mg/h | INTRAVENOUS | Status: DC
  Administered 2015-09-19: 10 mg/h via INTRAVENOUS
  Filled 2015-09-19: qty 25

## 2015-09-19 MED ORDER — SODIUM CHLORIDE 0.9 % IV SOLN: 100.0000 mL | INTRAVENOUS | Status: DC | PRN

## 2015-09-19 MED ORDER — ALTEPLASE 2 MG IJ SOLR: 2.0000 mg | Freq: Once | INTRAMUSCULAR | Status: DC | PRN

## 2015-09-19 MED ORDER — LIDOCAINE-PRILOCAINE 2.5-2.5 % EX CREA: 1.0000 "application " | TOPICAL_CREAM | CUTANEOUS | Status: DC | PRN

## 2015-09-19 MED ORDER — HEPARIN SODIUM (PORCINE) 1000 UNIT/ML DIALYSIS
1000.0000 [IU] | INTRAMUSCULAR | Status: DC | PRN
  Filled 2015-09-19: qty 1

## 2015-09-19 MED ORDER — VANCOMYCIN HCL 10 G IV SOLR
1750.0000 mg | Freq: Once | INTRAVENOUS | Status: AC
  Administered 2015-09-19: 1750 mg via INTRAVENOUS
  Filled 2015-09-19 (×2): qty 1750

## 2015-09-19 MED ORDER — PIPERACILLIN-TAZOBACTAM IN DEX 2-0.25 GM/50ML IV SOLN
2.2500 g | Freq: Three times a day (TID) | INTRAVENOUS | Status: DC
  Administered 2015-09-19 – 2015-09-22 (×9): 2.25 g via INTRAVENOUS
  Filled 2015-09-19 (×15): qty 50

## 2015-09-19 MED ORDER — NEPRO/CARBSTEADY PO LIQD
237.0000 mL | Freq: Three times a day (TID) | ORAL | Status: DC
  Administered 2015-09-20 – 2015-09-29 (×15): 237 mL via ORAL
  Filled 2015-09-19 (×36): qty 237

## 2015-09-19 MED ORDER — RENA-VITE PO TABS
1.0000 | ORAL_TABLET | Freq: Every day | ORAL | Status: DC
  Administered 2015-09-20 – 2015-09-28 (×9): 1 via ORAL
  Filled 2015-09-19 (×11): qty 1

## 2015-09-19 NOTE — Progress Notes (Signed)
Dialysis treatment completed.  3500 mL ultrafiltrated and net fluid removal 3000 mL.    Patient status unchanged. Lung sounds diminished, coarse to ausculation in all fields. Generalized edema. Cardiac: Sinus Loletha Grayer.  Disconnected lines and removed needles.  Pressure held for 10 minutes and band aid/gauze dressing applied.  Report given to bedside RN, Anderson Malta.

## 2015-09-19 NOTE — Progress Notes (Signed)
SUBJECTIVE: Unable to answer questions due to respiratory distress.     Intake/Output Summary (Last 24 hours) at 09/19/15 1121 Last data filed at 09/19/15 1113  Gross per 24 hour  Intake    123 ml  Output   2150 ml  Net  -2027 ml    Current Facility-Administered Medications  Medication Dose Route Frequency Provider Last Rate Last Dose  . 0.9 %  sodium chloride infusion  250 mL Intravenous PRN Belva Crome, MD      . 0.9 %  sodium chloride infusion  100 mL Intravenous PRN Elmarie Shiley, MD      . 0.9 %  sodium chloride infusion  100 mL Intravenous PRN Elmarie Shiley, MD      . acetaminophen (TYLENOL) tablet 650 mg  650 mg Oral Q4H PRN Belva Crome, MD      . albuterol (PROVENTIL) (2.5 MG/3ML) 0.083% nebulizer solution 2.5 mg  2.5 mg Nebulization Q6H PRN Satira Mccallum Tillery, PA-C      . alteplase (CATHFLO ACTIVASE) injection 2 mg  2 mg Intracatheter Once PRN Elmarie Shiley, MD      . amLODipine (NORVASC) tablet 5 mg  5 mg Oral Daily Satira Mccallum Tillery, PA-C   5 mg at 09/19/15 1051  . atorvastatin (LIPITOR) tablet 80 mg  80 mg Oral q1800 Belva Crome, MD   80 mg at 09/18/15 2019  . benzonatate (TESSALON) capsule 100 mg  100 mg Oral BID Satira Mccallum Tillery, PA-C   100 mg at 09/19/15 1051  . cyanocobalamin ((VITAMIN B-12)) injection 1,000 mcg  1,000 mcg Intramuscular Once Belva Crome, MD      . doxazosin (CARDURA) tablet 8 mg  8 mg Oral Daily Belva Crome, MD   8 mg at 09/19/15 1051  . feeding supplement (NEPRO CARB STEADY) liquid 237 mL  237 mL Oral TID BM Elmarie Shiley, MD      . ferumoxytol [facility]) 510 mg in sodium chloride 0.9 % 100 mL IVPB  510 mg Intravenous Weekly Amy D Clegg, NP   510 mg at 09/14/15 1116  . guaifenesin (ROBITUSSIN) 100 MG/5ML syrup 200 mg  200 mg Oral Q6H PRN Liliane Shi, PA-C   200 mg at 09/18/15 1035  . heparin injection 1,000 Units  1,000 Units Dialysis PRN Elmarie Shiley, MD      . heparin injection 3,500 Units  40 Units/kg Dialysis PRN Elmarie Shiley, MD       . hydrALAZINE (APRESOLINE) tablet 75 mg  75 mg Oral 3 times per day Larey Dresser, MD   75 mg at 09/19/15 0556  . insulin aspart (novoLOG) injection 0-15 Units  0-15 Units Subcutaneous TID WC Belva Crome, MD   2 Units at 09/19/15 [phone]  . insulin aspart (novoLOG) injection 0-5 Units  0-5 Units Subcutaneous QHS Belva Crome, MD   0 Units at 09/09/15 2200  . insulin glargine (LANTUS) injection 15 Units  15 Units Subcutaneous Daily Belva Crome, MD   15 Units at 09/19/15 1057  . isosorbide mononitrate (IMDUR) 24 hr tablet 60 mg  60 mg Oral Daily Belva Crome, MD   60 mg at 09/19/15 1051  . levothyroxine (SYNTHROID, LEVOTHROID) tablet 175 mcg  175 mcg Oral QAC breakfast Conrad Rensselaer, NP   175 mcg at 09/19/15 0556  . lidocaine (PF) (XYLOCAINE) 1 % injection 5 mL  5 mL Intradermal PRN Elmarie Shiley, MD      .  lidocaine-prilocaine (EMLA) cream 1 application  1 application Topical PRN Elmarie Shiley, MD      . memantine [facility]) tablet 5 mg  5 mg Oral Daily Belva Crome, MD   5 mg at 09/19/15 1051  . multivitamin (RENA-VIT) tablet 1 tablet  1 tablet Oral QHS Elmarie Shiley, MD      . ondansetron [facility]) injection 4 mg  4 mg Intravenous Q6H PRN Belva Crome, MD      . pantoprazole (PROTONIX) EC tablet 40 mg  40 mg Oral Q0600 Belva Crome, MD   40 mg at 09/19/15 0556  . pentafluoroprop-tetrafluoroeth (GEBAUERS) aerosol 1 application  1 application Topical PRN Elmarie Shiley, MD      . potassium chloride SA (K-DUR,KLOR-CON) CR tablet 40 mEq  40 mEq Oral Daily Amy D Clegg, NP   40 mEq at 09/19/15 1051  . sodium chloride flush (NS) 0.9 % injection 3 mL  3 mL Intravenous Q12H Belva Crome, MD   3 mL at 09/19/15 1113  . sodium chloride flush (NS) 0.9 % injection 3 mL  3 mL Intravenous PRN Belva Crome, MD      . torsemide [facility]) tablet 100 mg  100 mg Oral BID Elmarie Shiley, MD      . Warfarin - Pharmacist Dosing Inpatient   Does not apply q1800 Kem Parkinson, [place] at 09/19/15 1800    Filed Vitals:    09/18/15 2005 09/19/15 0500 09/19/15 1105 09/19/15 1114  BP: 136/41 130/42 133/39 122/48  Pulse: 59 69    Temp: 97.9 F (36.6 C) 98.2 F (36.8 C)  97.9 F (36.6 C)  TempSrc: Oral Oral    Resp: 18 18    Height:      Weight:  82 kg (180 lb 12.4 oz)    SpO2: 95% 91% 85% 100%    PHYSICAL EXAM General: Critically ill appearing, elderly. NAD. HEENT: Normal. Neck: Elevated JVP.  Lungs: Bilateral rhonchi and crackles CV: Nondisplaced PMI.  Regular rate and rhythm, normal S1/S2, no [id number], 2/6 systolic murmur over RUSB.  1-2+ pitting pretibial edema.    Abdomen: Soft, nontender.  Neurologic: Alert.  Psych: Normal affect. Musculoskeletal: No gross deformities. Extremities: No clubbing or cyanosis.   TELEMETRY: Reviewed telemetry pt in sinus rhythm.  LABS: Basic Metabolic Panel:  Recent Labs  09/17/15 0540 09/18/15 0337 09/19/15 0341  NA 136 135 137  K 3.8 4.0 4.9  CL 95* 93* 97*  CO2 30 29 28   GLUCOSE 207* 165* 139*  BUN 93* 103* 79*  CREATININE 3.19* 3.54* 3.68*  CALCIUM 8.8* 8.8* 8.8*  MG 2.2  --   --   PHOS 5.3*  --   --    Liver Function Tests:  Recent Labs  09/17/15 0540  ALBUMIN 2.9*   No results for input(s): LIPASE, AMYLASE in the last 72 hours. CBC:  Recent Labs  09/18/15 0337  WBC 16.3*  HGB 11.0*  HCT 35.8*  MCV 89.5  PLT 136*   Cardiac Enzymes: No results for input(s): CKTOTAL, CKMB, CKMBINDEX, TROPONINI in the last 72 hours. BNP: Invalid input(s): POCBNP D-Dimer: No results for input(s): DDIMER in the last 72 hours. Hemoglobin A1C: No results for input(s): HGBA1C in the last 72 hours. Fasting Lipid Panel: No results for input(s): CHOL, HDL, LDLCALC, TRIG, CHOLHDL, LDLDIRECT in the last 72 hours. Thyroid Function Tests: No results for input(s): TSH, T4TOTAL, T3FREE, THYROIDAB in the last 72 hours.  Invalid input(s): FREET3 Anemia  Panel: No results for input(s): VITAMINB12, FOLATE, FERRITIN, TIBC, IRON, RETICCTPCT in the last 72  hours.  RADIOLOGY: Dg Chest 2 View  09/10/2015  CLINICAL DATA:  Congestive heart failure. EXAM: CHEST  2 VIEW COMPARISON:  09/09/2015 FINDINGS: Previous median sternotomy and CABG procedure. The heart size appears normal. There is moderate diffuse interstitial edema throughout both lungs, unchanged from previous exam. Small pleural effusions are new from the previous study. IMPRESSION: 1. Persistent changes of CHF. 2. New bilateral pleural effusions. Electronically Signed   By: Kerby Moors M.D.   On: 09/10/2015 09:02   Dg Chest 2 View  09/09/2015  CLINICAL DATA:  Cough and shortness of breath EXAM: CHEST  2 VIEW COMPARISON:  March 23, 2014 FINDINGS: No pneumothorax. Mild cardiomegaly. The hila and mediastinum are unchanged. Mild pulmonary edema is identified. No other acute abnormalities. IMPRESSION: Pulmonary edema. Electronically Signed   By: Dorise Bullion III M.D   On: 09/09/2015 13:39   Dg Chest Port 1 View  09/18/2015  CLINICAL DATA:  Congestive heart failure EXAM: PORTABLE CHEST 1 VIEW COMPARISON:  09/10/2015 and 09/13/2015 FINDINGS: Cardiomediastinal silhouette is stable. Status post CABG again noted. Persist central vascular congestion and bilateral patchy airspace opacification with increased interstitial markings suspicious for pulmonary edema. Probable small left pleural effusion and left basilar atelectasis or infiltrate. IMPRESSION: Persist central vascular congestion and bilateral patchy airspace opacification with increased interstitial markings suspicious for pulmonary edema. Probable small left pleural effusion and left basilar atelectasis or infiltrate. Electronically Signed   By: Lahoma Crocker M.D.   On: 09/18/2015 12:42   Dg Chest Port 1 View  09/13/2015  CLINICAL DATA:  82 year old male with history of dyspnea. EXAM: PORTABLE CHEST 1 VIEW COMPARISON:  Chest x-ray 09/10/2015. FINDINGS: There is cephalization of the pulmonary vasculature, indistinctness of the interstitial  markings, and patchy airspace disease throughout the lungs bilaterally suggestive of moderate pulmonary edema. Small bilateral pleural effusions. Lung volumes are low. No pneumothorax. Mild cardiomegaly. Upper mediastinal contours are within normal limits. Atherosclerosis in the thoracic aorta. Status post median sternotomy for CABG. IMPRESSION: 1. The appearance of the chest suggests worsening congestive heart failure, as above. 2. Atherosclerosis. Electronically Signed   By: Vinnie Langton M.D.   On: 09/13/2015 16:17    ASSESSMENT AND PLAN: # Acute on chronic diastolic CHF:  # Hypoxic respiratory failure: EF 60-65%, moderate diastolic dysfunction.Tolerated ultrafiltration yesterday but still volume overloaded.  Today he has hypoxic respiratory failure.  Will start bipap and transfer to step down.  HD today.  # UTI, sepsis: Urinalysis for yesterday is positive.  He also has a leukocytosis but no fever.  We will start Zosyn and check blood and urine cultures.    # Atrial fibrillation: Paroxysmal, in NSR. No beta blocker with bradycardia. Continue warfarin. INR continues to rise to 4.5, likely due to hepatic congestion.     # CAD: s/p CABG, stable. Continue statin and Imdur.   #  AS: Moderate on echo.   #  Mitral stenosis: Mild on echo.   #  Hypothyroidism:TSH 1.5 . Was on Synthroid prior to admit. Continue 175 mcg synthroid.  # Essential HTN: BP well-controlled.  Will watch closely as he may also be septic. - Continue hydralazine 75 tid. - Continue amlodopine at 5 mg.  #  Anemia: Iron stores low. Got Fe this admission.   # CKD stage IV: HD with ultrafiltration yesterday.  Being followed by nephrology.  Will need HD again today for volume management.  Carinna Newhart C. Oval Linsey, MD, [facility]  09/19/2015  11:29 AM

## 2015-09-19 NOTE — Significant Event (Addendum)
Rapid Response Event Note  Overview: Time Called: 1101 Arrival Time: 1104 Event Type: Respiratory  Initial Focused Assessment:  Stat page for patient with respiratory distress.  Upon my arrival to patients room, family and RN at bedside.  Patient sitting up in bed with nasal cannula at 4lpm sats 85%.  Patient states he is tired and having trouble breathing.  Patient is lethargic, mildly SOB and labored, responsive to voice, oriented, skin hot and dry, face flushed.  Patient placed on NRB mask sats increased to 97%.  Breath sounds crackles.  Rectal temp 97.9, CBG 155, 122/48, HR 72, RR 20. RT called to bedside.   Interventions:  MD on floor and at bedside orders for BIPAP and ABG.  RT at bedside   Event Summary:  Patient to transfer to SDU   at      at          [facility], Harlin Rain

## 2015-09-19 NOTE — Progress Notes (Signed)
Patient 02 at 85% at 4 L. B/P 133/39. RT and RRT , MD and charge nursenotified to further evaluate the patient. Family members at bedside. Patient on a non-rebreather  at this time.MD ordered to transfer the patient for bipab.

## 2015-09-19 NOTE — Progress Notes (Signed)
[facility] Progress Note   Assessment/ Plan:   1. Acute renal failure on chronic kidney disease stage IV: Hemodynamically mediated acute on chronic renal failure from CHF exacerbation. Started on hemodialysis yesterday after failure to respond to diuretics consistently and persistence of volume overload. It appears that he is essentially progressed on to ESRD and this is likely to be chronic hemodialysis. At this point, he appears to be very deconditioned and the big question is whether he'll be able to tolerate chronic outpatient hemodialysis-yet to be seen. We'll order for hemodialysis again tomorrow, process initiated to try and get him on outpatient dialysis unit. 2. Acute exacerbation of diastolic heart failure: Initially with good response to furosemide/metolazone-unfortunately, diminishing returns over the past 2 days prompting need to initiate hemodialysis. Very deconditioned and will likely need to spend some time in a skilled nursing facility.  3. Anemia of chronic kidney disease: Status post intravenous iron for iron deficiency with hemoglobin acceptable limits. Not on Aranesp at this time . 4. Hypertension: Intermittently elevated blood pressures, anticipated to improve with ultrafiltration and hemodialysis. 5. Paroxysmal atrial fibrillation: Currently on warfarin for CVA prophylaxis. He is in sinus rhythm at this time and cannot tolerate beta blockers due to bradycardia. 6. Poor oral intake/anorexia: Awaiting RD consult, start renal vitamin/ONS  Subjective:   No acute events overnight-appears to have tolerated dialysis well yesterday.    Objective:   BP 130/42 mmHg  Pulse 69  Temp(Src) 98.2 F (36.8 C) (Oral)  Resp 18  Ht 5\' 10"  (1.778 m)  Wt 82 kg (180 lb 12.4 oz)  BMI 25.94 kg/m2  SpO2 91%  Intake/Output Summary (Last 24 hours) at 09/19/15 0930 Last data filed at 09/18/15 1845  Gross per 24 hour  Intake      0 ml  Output   2150 ml  Net  -2150 ml    Weight change:   Physical Exam: Gen: Sleeping comfortably-Wife and son are at bedside. CVS: Pulse regular, normal rate, S1 and S2 normal Resp: Decreased inspiratory effort with fine rales left base Abd: Soft, obese, nontender Ext: 2+ lower extremity edema.Left brachiocephalic fistula is tortuous but with good thrill and bruit- intact cannulation site dressings  Imaging: Dg Chest Port 1 View  09/18/2015  CLINICAL DATA:  Congestive heart failure EXAM: PORTABLE CHEST 1 VIEW COMPARISON:  09/10/2015 and 09/13/2015 FINDINGS: Cardiomediastinal silhouette is stable. Status post CABG again noted. Persist central vascular congestion and bilateral patchy airspace opacification with increased interstitial markings suspicious for pulmonary edema. Probable small left pleural effusion and left basilar atelectasis or infiltrate. IMPRESSION: Persist central vascular congestion and bilateral patchy airspace opacification with increased interstitial markings suspicious for pulmonary edema. Probable small left pleural effusion and left basilar atelectasis or infiltrate. Electronically Signed   By: Lahoma Crocker M.D.   On: 09/18/2015 12:42    Labs: BMET  Recent Labs Lab 09/13/15 0258 09/14/15 0343 09/15/15 0441 09/16/15 0534 09/17/15 0540 09/18/15 0337 09/19/15 0341  NA 136 137 133* 138 136 135 137  K 4.4 4.1 3.8 3.9 3.8 4.0 4.9  CL 98* 102 99* 94* 95* 93* 97*  CO2 25 25 26 27 30 29 28   GLUCOSE 128* 136* 170* 211* 207* 165* 139*  BUN 84* 84* 82* 87* 93* 103* 79*  CREATININE 3.19* 3.03* 3.01* 3.04* 3.19* 3.54* 3.68*  CALCIUM 8.5* 8.5* 8.4* 8.8* 8.8* 8.8* 8.8*  PHOS  --   --   --   --  5.3*  --   --  CBC  Recent Labs Lab 09/15/15 0441 09/18/15 0337  WBC 7.1 16.3*  HGB 10.4* 11.0*  HCT 34.1* 35.8*  MCV 88.6 89.5  PLT 120* 136*    Medications:    . amLODipine  5 mg Oral Daily  . atorvastatin  80 mg Oral q1800  . benzonatate  100 mg Oral BID  . cyanocobalamin  1,000 mcg Intramuscular  Once  . doxazosin  8 mg Oral Daily  . feeding supplement (GLUCERNA SHAKE)  237 mL Oral TID BM  . ferumoxytol  510 mg Intravenous Weekly  . furosemide  120 mg Intravenous BID  . hydrALAZINE  75 mg Oral 3 times per day  . insulin aspart  0-15 Units Subcutaneous TID WC  . insulin aspart  0-5 Units Subcutaneous QHS  . insulin glargine  15 Units Subcutaneous Daily  . isosorbide mononitrate  60 mg Oral Daily  . levothyroxine  175 mcg Oral QAC breakfast  . memantine  5 mg Oral Daily  . pantoprazole  40 mg Oral Q0600  . potassium chloride  40 mEq Oral Daily  . sodium chloride flush  3 mL Intravenous Q12H  . Warfarin - Pharmacist Dosing Inpatient   Does not apply [id number]   Elmarie Shiley, MD 09/19/2015, 9:30 AM

## 2015-09-19 NOTE — Progress Notes (Signed)
Patient transferred to 2M12 with a non-rebreather. Report  given to RN prior to transfer.

## 2015-09-19 NOTE — Progress Notes (Signed)
ANTICOAGULATION Consult Note - Follow up consult  Pharmacy Consult for Coumadin  Indication: atrial fibrillation  Allergies  Allergen Reactions  . Betadine [Povidone Iodine] Rash  . Iodinated Diagnostic Agents Rash and Other (See Comments)    Does fine with premedications for cath  . Latex Hives    Patient Measurements: Height: 5\' 10"  (177.8 cm) Weight: 180 lb 12.4 oz (82 kg) IBW/kg (Calculated) : 73  Vital Signs: Temp: 98.2 F (36.8 C) (03/26 0500) Temp Source: Oral (03/26 0500) BP: 130/42 mmHg (03/26 0500) Pulse Rate: 69 (03/26 0500)  Labs:  Recent Labs  09/17/15 0540 09/18/15 0337 09/19/15 0341  HGB  --  11.0*  --   HCT  --  35.8*  --   PLT  --  136*  --   LABPROT 32.5* 37.1* 41.9*  INR 3.25* 3.87* 4.56*  CREATININE 3.19* 3.54* 3.68*    Estimated Creatinine Clearance: 16.8 mL/min (by C-G formula based on Cr of 3.68).   Assessment: 22 yom admitted 09/09/2015 with SOB. Patient on coumadin PTA for AFib. Pharmacy consulted to continue dosing inpatient.   INR continues to rise despite held doses. Remains supratherapeutic today at 4.56. No CBC today but stable yesterday. CBC stable  On amiodarone PTA which has been stopped due to bradycardia.    PTA coumadin dose = 2.5mg  daily except 5mg  on Th, and Su.    Goal of Therapy:  INR 2-3 Monitor platelets by anticoagulation protocol: Yes   Plan:  -Hold warfarin tonight -Daily INR -Monitor CBC, s/sx bleeding  Stephens November, PharmD Clinical Pharmacy Resident  09/19/2015 10:34 AM

## 2015-09-19 NOTE — Progress Notes (Signed)
Pharmacy Antibiotic Note  [name] is a 82 y.o. male admitted on 09/09/2015. Pharmacy has been consulted for vancomycin/zosyn dosing for respiratory distress/possible PNA.  Plan: -Vancomycin 1750mg  x1 dose -Zosyn 2.25g q8h -Follow up HD plans for further vancomycin dosing. Next HD tentatively set for 3/27 -Monitor cultures, LOT, clinical progress   Height: 5\' 10"  (177.8 cm) Weight: 180 lb 12.4 oz (82 kg) IBW/kg (Calculated) : 73  Temp (24hrs), Avg:97.9 F (36.6 C), Min:97.6 F (36.4 C), Max:98.2 F (36.8 C)   Recent Labs Lab 09/15/15 0441 09/16/15 0534 09/17/15 0540 09/18/15 0337 09/19/15 0341  WBC 7.1  --   --  16.3*  --   CREATININE 3.01* 3.04* 3.19* 3.54* 3.68*    Estimated Creatinine Clearance: 16.8 mL/min (by C-G formula based on Cr of 3.68).    Allergies  Allergen Reactions  . Betadine [Povidone Iodine] Rash  . Iodinated Diagnostic Agents Rash and Other (See Comments)    Does fine with premedications for cath  . Latex Hives    Antimicrobials this admission: Vancomycin 3/26>> Zosyn 3/26>>  Microbiology results: 3/26 BCx: pending 3/26 UCx: pending   Thank you for allowing pharmacy to be a part of this patient's care.  Judieth Keens, PharmD Clinical Pharmacy Resident 09/19/2015 11:48 AM

## 2015-09-19 NOTE — Progress Notes (Signed)
CRITICAL VALUE ALERT  Critical value received: Blood Gases   PH 7.28  CO 64  PO2 134  Bicarb 29 bases excess of 3  On Non Rebreather  Date of notification:  09/19/15   Time of notification:  [id number]  Critical value read back:yes    Nurse who received alert:  Enos Fling, RN   MD notified (1st page):  [phone]  Dr. Oval Linsey at the bedside.

## 2015-09-19 NOTE — Progress Notes (Signed)
Patient has amber urine output   after dialysis  and every time  the patient urinated patient stated he has burning sensation. Dr. Posey Pronto made aware. Family members at bedside.

## 2015-09-19 NOTE — Progress Notes (Signed)
Arrived to patient room 10M-12.  Reviewed treatment plan and this RN agrees.  Report received from bedside RN, Anderson Malta.  Consent verified.  Initial RO chloramine check less than 0.02.  Patient Disoriented to time/year. Lung sounds Diminished, coarse to ausculation in all fields. Generazlied edema. Cardiac: Sinus [name].  Prepped LUAVF with alcohol and cannulated with two 17 gauge needles.  Pulsation of blood noted.  Flushed access well with saline per protocol.  Connected and secured lines and initiated tx at 2013.  UF goal of 3500 mL and net fluid removal of 3000 mL.  Will continue to monitor.

## 2015-09-19 NOTE — Progress Notes (Signed)
Patient transferred to ICU on bipap now for SOB.  Has CKD and was started on HD yesterday, first HD yest with 2 kg off.  Plan HD tonight, get vol down further.    Kelly Splinter MD [employer] pager [phone]    cell [phone] 09/19/2015, 6:22 PM

## 2015-09-20 DIAGNOSIS — N186 End stage renal disease: Secondary | ICD-10-CM

## 2015-09-20 LAB — PROTIME-INR
INR: 4.96 — ABNORMAL HIGH (ref 0.00–1.49)
Prothrombin Time: 44.6 seconds — ABNORMAL HIGH (ref 11.6–15.2)

## 2015-09-20 LAB — GLUCOSE, CAPILLARY
GLUCOSE-CAPILLARY: 104 mg/dL — AB (ref 65–99)
GLUCOSE-CAPILLARY: 214 mg/dL — AB (ref 65–99)
GLUCOSE-CAPILLARY: 92 mg/dL (ref 65–99)
Glucose-Capillary: 109 mg/dL — ABNORMAL HIGH (ref 65–99)
Glucose-Capillary: 181 mg/dL — ABNORMAL HIGH (ref 65–99)
Glucose-Capillary: 132 mg/dL — ABNORMAL HIGH (ref 65–99)

## 2015-09-20 LAB — BASIC METABOLIC PANEL
ANION GAP: 14 (ref 5–15)
BUN: 56 mg/dL — AB (ref 6–20)
CALCIUM: 8.4 mg/dL — AB (ref 8.9–10.3)
CO2: 25 mmol/L (ref 22–32)
Chloride: 100 mmol/L — ABNORMAL LOW (ref 101–111)
Creatinine, Ser: 3.41 mg/dL — ABNORMAL HIGH (ref 0.61–1.24)
GFR calc Af Amer: 18 mL/min — ABNORMAL LOW (ref >60)
GFR, EST NON AFRICAN AMERICAN: 16 mL/min — AB (ref >60)
GLUCOSE: 105 mg/dL — AB (ref 65–99)
Potassium: 4.6 mmol/L (ref 3.5–5.1)
Sodium: 139 mmol/L (ref 135–145)

## 2015-09-20 LAB — VANCOMYCIN, RANDOM: Vancomycin Rm: 14 ug/mL

## 2015-09-20 MED ORDER — VANCOMYCIN HCL IN DEXTROSE 1-5 GM/200ML-% IV SOLN
1000.0000 mg | INTRAVENOUS | Status: DC | PRN
  Filled 2015-09-20: qty 200

## 2015-09-20 MED ORDER — FUROSEMIDE 80 MG PO TABS
160.0000 mg | ORAL_TABLET | Freq: Three times a day (TID) | ORAL | Status: DC
  Administered 2015-09-20 – 2015-09-22 (×6): 160 mg via ORAL
  Filled 2015-09-20 (×9): qty 2

## 2015-09-20 MED ORDER — CETYLPYRIDINIUM CHLORIDE 0.05 % MT LIQD
7.0000 mL | Freq: Two times a day (BID) | OROMUCOSAL | Status: DC
  Administered 2015-09-21 – 2015-09-29 (×11): 7 mL via OROMUCOSAL

## 2015-09-20 MED ORDER — CHLORHEXIDINE GLUCONATE 0.12 % MT SOLN
15.0000 mL | Freq: Two times a day (BID) | OROMUCOSAL | Status: DC
  Administered 2015-09-20 – 2015-09-29 (×12): 15 mL via OROMUCOSAL
  Filled 2015-09-20 (×13): qty 15

## 2015-09-20 MED ORDER — VANCOMYCIN HCL IN DEXTROSE 1-5 GM/200ML-% IV SOLN
1000.0000 mg | INTRAVENOUS | Status: AC
  Administered 2015-09-20: 1000 mg via INTRAVENOUS

## 2015-09-20 MED ORDER — CETYLPYRIDINIUM CHLORIDE 0.05 % MT LIQD
7.0000 mL | Freq: Two times a day (BID) | OROMUCOSAL | Status: DC
  Administered 2015-09-20: 7 mL via OROMUCOSAL

## 2015-09-20 MED ORDER — VANCOMYCIN HCL IN DEXTROSE 1-5 GM/200ML-% IV SOLN
INTRAVENOUS | Status: AC
  Administered 2015-09-20: 1000 mg via INTRAVENOUS
  Filled 2015-09-20: qty 200

## 2015-09-20 MED ORDER — ATORVASTATIN CALCIUM 80 MG PO TABS
80.0000 mg | ORAL_TABLET | Freq: Every day | ORAL | Status: DC
  Administered 2015-09-20 – 2015-09-29 (×9): 80 mg via ORAL
  Filled 2015-09-20 (×11): qty 1

## 2015-09-20 NOTE — Progress Notes (Signed)
ANTICOAGULATION Consult Note - Follow up consult  Pharmacy Consult for Coumadin  Indication: atrial fibrillation  Allergies  Allergen Reactions  . Betadine [Povidone Iodine] Rash  . Iodinated Diagnostic Agents Rash and Other (See Comments)    Does fine with premedications for cath  . Latex Hives    Patient Measurements: Height: 5\' 10"  (177.8 cm) Weight: 182 lb 1.6 oz (82.6 kg) IBW/kg (Calculated) : 73  Vital Signs: Temp: 98.5 F (36.9 C) (03/27 0422) Temp Source: Oral (03/27 0857) BP: 137/49 mmHg (03/27 0906) Pulse Rate: 57 (03/27 0906)  Labs:  Recent Labs  09/18/15 0337 09/19/15 0341 09/20/15 0314  HGB 11.0*  --   --   HCT 35.8*  --   --   PLT 136*  --   --   LABPROT 37.1* 41.9* 44.6*  INR 3.87* 4.56* 4.96*  CREATININE 3.54* 3.68* 3.41*    Estimated Creatinine Clearance: 18.1 mL/min (by C-G formula based on Cr of 3.41).   Assessment: 59 yom admitted 09/09/2015 with SOB. Patient on coumadin PTA for AFib. Pharmacy consulted to continue dosing inpatient.   Remains supratherapeutic today at 4.96. No CBC today but stable yesterday. No s/sx bleeding noted.     On amiodarone PTA which has been stopped due to bradycardia.    PTA coumadin dose = 2.5mg  daily except 5mg  on Th, and Su.    Goal of Therapy:  INR 2-3 Monitor platelets by anticoagulation protocol: Yes   Plan:  -Hold warfarin tonight -Daily INR -Monitor CBC, s/sx bleeding  Bennye Alm, PharmD Pharmacy Resident [phone]

## 2015-09-20 NOTE — Progress Notes (Signed)
Subjective: Interval History: has no complaint, wants to go home.  Objective: Vital signs in last 24 hours: Temp:  [97.4 F (36.3 C)-98.5 F (36.9 C)] 98.5 F (36.9 C) (03/27 0422) Pulse Rate:  [31-67] 65 (03/27 0700) Resp:  [16-32] 25 (03/27 0700) BP: (114-146)/(39-62) 136/54 mmHg (03/27 0700) SpO2:  [85 %-100 %] 99 % (03/27 0700) FiO2 (%):  [60 %-100 %] 60 % (03/27 0330) Weight:  [82.6 kg (182 lb 1.6 oz)-91.4 kg (201 lb 8 oz)] 82.6 kg (182 lb 1.6 oz) (03/27 0500) Weight change: 8.346 kg (18 lb 6.4 oz)  Intake/Output from previous day: 03/26 0701 - 03/27 0700 In: 419.5 [P.O.:120; I.V.:149.5; IV Piggyback:150] Out: 3200 [Urine:200] Intake/Output this shift:    General appearance: cooperative and slowed mentation Resp: diminished breath sounds bilaterally and rales bibasilar Cardio: S1, S2 normal and systolic murmur: holosystolic 2/6, blowing at apex GI: pos bs, liver down, 5 cm, soft Extremities: edema 3 + and AVF RUA, bruise  Lab Results:  Recent Labs  09/18/15 0337  WBC 16.3*  HGB 11.0*  HCT 35.8*  PLT 136*   BMET:  Recent Labs  09/19/15 0341 09/20/15 0314  NA 137 139  K 4.9 4.6  CL 97* 100*  CO2 28 25  GLUCOSE 139* 105*  BUN 79* 56*  CREATININE 3.68* 3.41*  CALCIUM 8.8* 8.4*   No results for input(s): PTH in the last 72 hours. Iron Studies: No results for input(s): IRON, TIBC, TRANSFERRIN, FERRITIN in the last 72 hours.  Studies/Results: Dg Chest Port 1 View  09/19/2015  CLINICAL DATA:  Respiratory distress with hypoxia. EXAM: PORTABLE CHEST 1 VIEW COMPARISON:  None. FINDINGS: The heart is enlarged. There has been previous CABG. LEFT lower lobe atelectasis, effusion, and possible infiltrate are redemonstrated, similar to priors. Diffuse BILATERAL airspace opacity suggests edema, and may be slightly improved. IMPRESSION: Slight improvement aeration from priors. Persistent BILATERAL chest abnormalities. Electronically Signed   By: Staci Righter M.D.   On:  09/19/2015 15:50   Dg Chest Port 1 View  09/18/2015  CLINICAL DATA:  Congestive heart failure EXAM: PORTABLE CHEST 1 VIEW COMPARISON:  09/10/2015 and 09/13/2015 FINDINGS: Cardiomediastinal silhouette is stable. Status post CABG again noted. Persist central vascular congestion and bilateral patchy airspace opacification with increased interstitial markings suspicious for pulmonary edema. Probable small left pleural effusion and left basilar atelectasis or infiltrate. IMPRESSION: Persist central vascular congestion and bilateral patchy airspace opacification with increased interstitial markings suspicious for pulmonary edema. Probable small left pleural effusion and left basilar atelectasis or infiltrate. Electronically Signed   By: Lahoma Crocker M.D.   On: 09/18/2015 12:42    I have reviewed the patient's current medications.  Assessment/Plan: 1 CKD 5 has some function but not a lot , will maximize po Lasix, see if can make urine.  Will do HD today and lower vol as much xs and had resp difficulty. 2 Anemia given Fe 3 HPTH check 4 HTN  Lower meds 5 Resp failure vol xs 6 CAD stop statin 7 Dementia 8 Debill need to see how he does over next few days 9Carotid dz P HD, Fe, PTH, control DM, stop meds,     LOS: 11 days   Deborha Moseley L 09/20/2015,8:05 AM

## 2015-09-20 NOTE — Procedures (Signed)
I was present at this session.  I have reviewed the session itself and made appropriate changes.  AVF working well, small needles, limited flow, bp tol hd well. Breathing better.  [name] L 3/27/20171:20 PM

## 2015-09-20 NOTE — Progress Notes (Addendum)
Pharmacy Antibiotic Note  [name] is a 82 y.o. male admitted on 09/09/2015. Pharmacy has been consulted for vancomycin/zosyn dosing for respiratory distress/possible PNA.  Plan: -Vancomycin 1000 mg qHD  -Zosyn 2.25g q8h -Follow up HD plans for further vancomycin dosing. Next HD tentatively set for 3/27 -Monitor cultures, LOT, clinical progress   Height: 5\' 10"  (177.8 cm) Weight: 182 lb 1.6 oz (82.6 kg) IBW/kg (Calculated) : 73  Temp (24hrs), Avg:98.1 F (36.7 C), Min:97.4 F (36.3 C), Max:98.5 F (36.9 C)   Recent Labs Lab 09/15/15 0441 09/16/15 0534 09/17/15 0540 09/18/15 0337 09/19/15 0341 09/20/15 0314  WBC 7.1  --   --  16.3*  --   --   CREATININE 3.01* 3.04* 3.19* 3.54* 3.68* 3.41*  VANCORANDOM  --   --   --   --   --  14    Estimated Creatinine Clearance: 18.1 mL/min (by C-G formula based on Cr of 3.41).    Allergies  Allergen Reactions  . Betadine [Povidone Iodine] Rash  . Iodinated Diagnostic Agents Rash and Other (See Comments)    Does fine with premedications for cath  . Latex Hives   3/27 Vanc Post HD Level = 14  Antimicrobials this admission: Vancomycin 3/26>> Zosyn 3/26>>  Microbiology results: 3/26 BCx: pending 3/26 UCx: pending MRSA PCR Negative  Thank you for allowing pharmacy to be a part of this patient's care.  Kem Parkinson, PharmD Clinical Pharmacy Resident 09/20/2015 8:50 AM

## 2015-09-20 NOTE — Progress Notes (Signed)
Patient ID: [name], male   DOB: 12/16/1933, 82 y.o.   MRN: [id number]    Advanced Heart Failure Rounding Note   Subjective:    Started HD 3/25. Remains on high dose lasix. Yesterday he was transferred to ICU due to acute respiratory distress. Placed on Bipap. Weaned to NRB.   Wants to go home.    09/10/2015: EF 60-65% MV severely calcified. Grade II DD . Moderate AS     Objective:   Weight Range:  Vital Signs:   Temp:  [97.4 F (36.3 C)-98.5 F (36.9 C)] 98.5 F (36.9 C) (03/27 0422) Pulse Rate:  [31-67] 65 (03/27 0700) Resp:  [16-32] 25 (03/27 0700) BP: (114-146)/(39-62) 136/54 mmHg (03/27 0700) SpO2:  [85 %-100 %] 99 % (03/27 0700) FiO2 (%):  [60 %-100 %] 60 % (03/27 0330) Weight:  [182 lb 1.6 oz (82.6 kg)-201 lb 8 oz (91.4 kg)] 182 lb 1.6 oz (82.6 kg) (03/27 0500) Last BM Date: 09/18/15  Weight change: Filed Weights   09/19/15 1934 09/19/15 2313 09/20/15 0500  Weight: 201 lb 8 oz (91.4 kg) 194 lb 14.2 oz (88.4 kg) 182 lb 1.6 oz (82.6 kg)    Intake/Output:   Intake/Output Summary (Last 24 hours) at 09/20/15 [id number] Last data filed at 09/20/15 0600  Gross per 24 hour  Intake  419.5 ml  Output   3200 ml  Net -2780.5 ml     Physical Exam: General:  Chronically ill appearing and Elderly appearing. NAD. Inb ed.  HEENT: normal Neck: supple. JVP to ear.  Carotids 2+ bilat; no bruits. No thyromegaly or nodule noted.  Cor: PMI nondisplaced. Regular rate & rhythm. No rubs, gallops. AS .  Lungs: RML RLL LLL crackles on 60%  oxygen.  Abdomen: soft, nontender, nondistended. No hepatosplenomegaly. No bruits or masses. Good bowel sounds. Extremities: no cyanosis, clubbing, rash.  R and LLE SCDs. R and LLE trace edema. LUE AVF  Neuro: alert & orientedx3, cranial nerves grossly intact. moves all 4 extremities w/o difficulty. Affect pleasant GU: Foley.   Telemetry: Reviewed,  Sinus Brady 50-60s   Labs: Basic Metabolic Panel:  Recent Labs Lab 09/16/15 0534  09/17/15 0540 09/18/15 0337 09/19/15 0341 09/20/15 0314  NA 138 136 135 137 139  K 3.9 3.8 4.0 4.9 4.6  CL 94* 95* 93* 97* 100*  CO2 27 30 29 28 25   GLUCOSE 211* 207* 165* 139* 105*  BUN 87* 93* 103* 79* 56*  CREATININE 3.04* 3.19* 3.54* 3.68* 3.41*  CALCIUM 8.8* 8.8* 8.8* 8.8* 8.4*  MG  --  2.2  --   --   --   PHOS  --  5.3*  --   --   --     Liver Function Tests:  Recent Labs Lab 09/17/15 0540  ALBUMIN 2.9*   No results for input(s): LIPASE, AMYLASE in the last 168 hours. No results for input(s): AMMONIA in the last 168 hours.  CBC:  Recent Labs Lab 09/15/15 0441 09/18/15 0337  WBC 7.1 16.3*  HGB 10.4* 11.0*  HCT 34.1* 35.8*  MCV 88.6 89.5  PLT 120* 136*    Cardiac Enzymes: No results for input(s): CKTOTAL, CKMB, CKMBINDEX, TROPONINI in the last 168 hours.  BNP: BNP (last 3 results)  Recent Labs  09/09/15 1326 09/09/15 1542  BNP 605.6* 729.6*    ProBNP (last 3 results) No results for input(s): PROBNP in the last 8760 hours.    Other results:  Imaging: Dg Chest Port 1 View  09/19/2015  CLINICAL DATA:  Respiratory distress with hypoxia. EXAM: PORTABLE CHEST 1 VIEW COMPARISON:  None. FINDINGS: The heart is enlarged. There has been previous CABG. LEFT lower lobe atelectasis, effusion, and possible infiltrate are redemonstrated, similar to priors. Diffuse BILATERAL airspace opacity suggests edema, and may be slightly improved. IMPRESSION: Slight improvement aeration from priors. Persistent BILATERAL chest abnormalities. Electronically Signed   By: Staci Righter M.D.   On: 09/19/2015 15:50   Dg Chest Port 1 View  09/18/2015  CLINICAL DATA:  Congestive heart failure EXAM: PORTABLE CHEST 1 VIEW COMPARISON:  09/10/2015 and 09/13/2015 FINDINGS: Cardiomediastinal silhouette is stable. Status post CABG again noted. Persist central vascular congestion and bilateral patchy airspace opacification with increased interstitial markings suspicious for pulmonary edema.  Probable small left pleural effusion and left basilar atelectasis or infiltrate. IMPRESSION: Persist central vascular congestion and bilateral patchy airspace opacification with increased interstitial markings suspicious for pulmonary edema. Probable small left pleural effusion and left basilar atelectasis or infiltrate. Electronically Signed   By: Lahoma Crocker M.D.   On: 09/18/2015 12:42     Medications:     Scheduled Medications: . amLODipine  5 mg Oral Daily  . antiseptic oral rinse  7 mL Mouth Rinse BID  . benzonatate  100 mg Oral BID  . cyanocobalamin  1,000 mcg Intramuscular Once  . feeding supplement (NEPRO CARB STEADY)  237 mL Oral TID BM  . ferumoxytol  510 mg Intravenous Weekly  . furosemide  160 mg Oral TID  . insulin aspart  0-15 Units Subcutaneous TID WC  . insulin aspart  0-5 Units Subcutaneous QHS  . insulin glargine  15 Units Subcutaneous Daily  . isosorbide mononitrate  60 mg Oral Daily  . levothyroxine  175 mcg Oral QAC breakfast  . memantine  5 mg Oral Daily  . multivitamin  1 tablet Oral QHS  . pantoprazole  40 mg Oral Q0600  . piperacillin-tazobactam (ZOSYN)  IV  2.25 g Intravenous 3 times per day  . sodium chloride flush  3 mL Intravenous Q12H  . Warfarin - Pharmacist Dosing Inpatient   Does not apply q1800    Infusions:    PRN Medications: sodium chloride, sodium chloride, sodium chloride, sodium chloride, sodium chloride, acetaminophen, albuterol, alteplase, guaifenesin, heparin, lidocaine (PF), lidocaine-prilocaine, ondansetron (ZOFRAN) IV, pentafluoroprop-tetrafluoroeth, sodium chloride flush   Assessment/ Plan /Discussion.    1. Acute on chronic diastolic CHF: EF [id number], moderate diastolic dysfunction.Remains with marked volume overload still.  -Started on HD 3/25. . 2. CKD: Stage IV - Started HD 3/25. Creatinine 3.41 Plan for HD today.  3. Atrial fibrillation: Paroxysmal, in NSR. No beta blocker with bradycardia. Continue warfarin. INR 4.96.  Pharmacy dosing.   4. CAD: s/p CABG, stable. Continue statin.  5. AS: Moderate on echo.  6. Mitral stenosis: Mild on echo.  7. Hypothyroidism:TSH 1.5 . Continue 175 mcg synthroid. 8. HTN: BP ok.  - Off hydralazine 75 tid.   - Will resume amlodopine at 5 mg 9. Anemia: Iron stores low. Got Fe this admission.  10. Dementia 11. Deconditioning: PT following. Recommending SNF. SW following for SNF 12.  Poor PO intake: Dietician input appreciated.   13. Dysuria: UA- many bacteria.  Urine culture pending. On Zosyn and Vanc.   14. Acute/Chronic Respiratory Failure - Transferred to ICU on NRB on 3./26. He has been weaned to 60% oxygen.   Will need Palliative Care Consult.   Amy Clegg NP-C   09/20/2015 8:07 AM   Advanced Heart Failure Team Pager [phone] (  M-F; 7a - 4p)  Please contact [facility] for night-coverage after hours (4p -7a ) and weekends on [email or web address]  Patient seen with NP, agree with the above note.  He remains volume overloaded on exam.  Started HD, getting ongoing HD for volume removal (headed there this morning).  Weight coming down, still on high dose Lasix as he still makes some urine.  BP stable.    Loralie Champagne 09/20/2015 11:32 AM

## 2015-09-21 LAB — COMPREHENSIVE METABOLIC PANEL
ALBUMIN: 2.6 g/dL — AB (ref 3.5–5.0)
ALT: 69 U/L — AB (ref 17–63)
ANION GAP: 13 (ref 5–15)
AST: 66 U/L — ABNORMAL HIGH (ref 15–41)
Alkaline Phosphatase: 103 U/L (ref 38–126)
BUN: 57 mg/dL — ABNORMAL HIGH (ref 6–20)
CHLORIDE: 97 mmol/L — AB (ref 101–111)
CO2: 27 mmol/L (ref 22–32)
Calcium: 8.4 mg/dL — ABNORMAL LOW (ref 8.9–10.3)
Creatinine, Ser: 4.34 mg/dL — ABNORMAL HIGH (ref 0.61–1.24)
GFR calc non Af Amer: 12 mL/min — ABNORMAL LOW (ref >60)
GFR, EST AFRICAN AMERICAN: 14 mL/min — AB (ref >60)
Glucose, Bld: 148 mg/dL — ABNORMAL HIGH (ref 65–99)
Potassium: 4.4 mmol/L (ref 3.5–5.1)
SODIUM: 137 mmol/L (ref 135–145)
Total Bilirubin: 1.5 mg/dL — ABNORMAL HIGH (ref 0.3–1.2)
Total Protein: 6.7 g/dL (ref 6.5–8.1)

## 2015-09-21 LAB — PARATHYROID HORMONE, INTACT (NO CA): PTH: 53 pg/mL (ref 15–65)

## 2015-09-21 LAB — CBC
HCT: 36 % — ABNORMAL LOW (ref 39.0–52.0)
Hemoglobin: 10.8 g/dL — ABNORMAL LOW (ref 13.0–17.0)
MCH: 27.5 pg (ref 26.0–34.0)
MCHC: 30 g/dL (ref 30.0–36.0)
MCV: 91.6 fL (ref 78.0–100.0)
PLATELETS: 150 10*3/uL (ref 150–400)
RBC: 3.93 MIL/uL — AB (ref 4.22–5.81)
RDW: 16.4 % — ABNORMAL HIGH (ref 11.5–15.5)
WBC: 12.6 10*3/uL — ABNORMAL HIGH (ref 4.0–10.5)

## 2015-09-21 LAB — PHOSPHORUS: Phosphorus: 6 mg/dL — ABNORMAL HIGH (ref 2.5–4.6)

## 2015-09-21 LAB — GLUCOSE, CAPILLARY
GLUCOSE-CAPILLARY: 129 mg/dL — AB (ref 65–99)
GLUCOSE-CAPILLARY: 178 mg/dL — AB (ref 65–99)
GLUCOSE-CAPILLARY: 143 mg/dL — AB (ref 65–99)

## 2015-09-21 LAB — PROTIME-INR
INR: 3.77 — AB (ref 0.00–1.49)
PROTHROMBIN TIME: 36.4 s — AB (ref 11.6–15.2)

## 2015-09-21 MED ORDER — VANCOMYCIN HCL IN DEXTROSE 1-5 GM/200ML-% IV SOLN: 1000.0000 mg | INTRAVENOUS | Status: DC

## 2015-09-21 NOTE — Progress Notes (Signed)
Occupational Therapy Treatment Patient Details Name: [name] MRN: [id number] DOB: 1934/04/02 Today's Date: 09/21/2015    History of present illness [name] is a 82 y.o. male with complaint of 2-3 week history of lower extremity swelling, cough, dyspnea, and recent visit with primary physician, Dr. Philip [name] who informed the patient and his daughter that he was in heart failure.  pt with fistula for anticipated HD. Rapid Response called due to respiratory distress and pt transferred to ICU 3/26.   OT comments  Pt more aroused this session and with vc following commands. Pt fixated on producing secretions and coughing up some secretions during session. Pt fatigued after transfer to chair. Oxygen levels remained above 90% on high flow [place].   Follow Up Recommendations  SNF;Supervision/Assistance - 24 hour    Equipment Recommendations  3 in 1 bedside comode;Wheelchair (measurements OT);Wheelchair cushion (measurements OT);Hospital bed;Other (comment)    Recommendations for Other Services      Precautions / Restrictions Precautions Precautions: Fall Precaution Comments: high flow [place] Restrictions Weight Bearing Restrictions: No       Mobility Bed Mobility Overal bed mobility: Needs Assistance Bed Mobility: Supine to Sit     Supine to sit: Mod assist;HOB elevated     General bed mobility comments: eob with PT on arrival  Transfers Overall transfer level: Needs assistance Equipment used: 2 person hand held assist Transfers: Sit to/from Stand Sit to Stand: Mod assist;+2 physical assistance         General transfer comment: bed elevated and cues to initiate task. therapist (A) with knee extension and upright posture    Balance Overall balance assessment: Needs assistance Sitting-balance support: Bilateral upper extremity supported;Feet supported Sitting balance-Leahy Scale: Fair Sitting balance - Comments: Able to sit unsupported EOB x10 mins   Standing  balance support: During functional activity;Bilateral upper extremity supported Standing balance-Leahy Scale: Poor Standing balance comment: requires cues for sequence                   ADL Overall ADL's : Needs assistance/impaired Eating/Feeding: Set up;Sitting   Grooming: Oral care;Moderate assistance;Sitting Grooming Details (indicate cue type and reason): pt needed cues to sequence task and to rinse mouth. pt fixated on producing secretions                  Toilet Transfer: +2 for physical assistance;Moderate assistance;Stand-pivot (simulated) Toilet Transfer Details (indicate cue type and reason): pt unsteady with posterior            General ADL Comments: pt transferred oob to chair this session for grooming task. pt fatigued after task      Vision                     Perception     Praxis      Cognition   Behavior During Therapy: Flat affect Overall Cognitive Status: History of cognitive impairments - at baseline                       Extremity/Trunk Assessment               Exercises General Exercises - Lower Extremity Long Arc Quad: Both;5 reps;Seated   Shoulder Instructions       General Comments      Pertinent Vitals/ Pain       Pain Assessment: No/denies pain Faces Pain Scale: No hurt  Home Living  Prior Functioning/Environment              Frequency Min 2X/week     Progress Toward Goals  OT Goals(current goals can now be found in the care plan section)  Progress towards OT goals: Progressing toward goals  Acute Rehab OT Goals Patient Stated Goal: none stated OT Goal Formulation: With family Potential to Achieve Goals: Good ADL Goals Pt Will Perform Grooming: with min guard assist;sitting Pt Will Perform Lower Body Bathing: with mod assist;sit to/from stand Pt Will Transfer to Toilet: with mod assist;stand pivot transfer;bedside  commode Additional ADL Goal #1: Pt will complete bed mobility min (A) as precursor to adls  Plan Discharge plan remains appropriate    Co-evaluation    PT/OT/SLP Co-Evaluation/Treatment: Yes Reason for Co-Treatment: Complexity of the patient's impairments (multi-system involvement);For patient/therapist safety PT goals addressed during session: Mobility/safety with mobility OT goals addressed during session: ADL's and self-care;Strengthening/ROM      End of Session Equipment Utilized During Treatment: Gait belt;Rolling walker;Oxygen   Activity Tolerance Patient tolerated treatment well   Patient Left in chair;with call bell/phone within reach;with chair alarm set   Nurse Communication Mobility status;Precautions        Time: [id number] OT Time Calculation (min): 13 min  Charges: OT General Charges $OT Visit: 1 Procedure OT Treatments $Self Care/Home Management : 8-22 mins  Parke Poisson B 09/21/2015, 12:56 PM   Jeri Modena   OTR/L Pager: [phone] Office: [phone] .

## 2015-09-21 NOTE — Progress Notes (Signed)
Subjective: Interval History: has complaints , coughing thick phlegm, thirsty.  Objective: Vital signs in last 24 hours: Temp:  [97.8 F (36.6 C)-98.4 F (36.9 C)] 98.1 F (36.7 C) (03/28 0400) Pulse Rate:  [53-60] 53 (03/28 0700) Resp:  [15-32] 25 (03/28 0700) BP: (116-140)/(43-70) 130/45 mmHg (03/28 0700) SpO2:  [94 %-100 %] 97 % (03/28 0700) FiO2 (%):  [55 %] 55 % (03/27 0906) Weight:  [79.3 kg (174 lb 13.2 oz)-82.1 kg (181 lb)] 82.1 kg (181 lb) (03/28 0500) Weight change: -10.1 kg (-22 lb 4.3 oz)  Intake/Output from previous day: 03/27 0701 - 03/28 0700 In: 264.5 [P.O.:137; I.V.:27.5; IV Piggyback:100] Out: 2520 [Urine:20] Intake/Output this shift:    General appearance: cooperative, mildly obese, pale and slowed mentation Resp: rales bibasilar and rhonchi bibasilar Cardio: S1, S2 normal and systolic murmur: holosystolic 2/6, blowing at apex GI: liver down 5 cm , tender,pos bs Extremities: edema 3-4 + and AVF LUA  Lab Results: No results for input(s): WBC, HGB, HCT, PLT in the last 72 hours. BMET:  Recent Labs  09/19/15 0341 09/20/15 0314  NA 137 139  K 4.9 4.6  CL 97* 100*  CO2 28 25  GLUCOSE 139* 105*  BUN 79* 56*  CREATININE 3.68* 3.41*  CALCIUM 8.8* 8.4*   No results for input(s): PTH in the last 72 hours. Iron Studies: No results for input(s): IRON, TIBC, TRANSFERRIN, FERRITIN in the last 72 hours.  Studies/Results: Dg Chest Port 1 View  09/19/2015  CLINICAL DATA:  Respiratory distress with hypoxia. EXAM: PORTABLE CHEST 1 VIEW COMPARISON:  None. FINDINGS: The heart is enlarged. There has been previous CABG. LEFT lower lobe atelectasis, effusion, and possible infiltrate are redemonstrated, similar to priors. Diffuse BILATERAL airspace opacity suggests edema, and may be slightly improved. IMPRESSION: Slight improvement aeration from priors. Persistent BILATERAL chest abnormalities. Electronically Signed   By: Staci Righter M.D.   On: 09/19/2015 15:50    I  have reviewed the patient's current medications.  Assessment/Plan: 1 CKD4-5, ? ESRD no urine thus far with Lasix challenge. Vol xs, HD in am. 2 Anemia mild 3 HPTH needs PTH 4 DM controlled 5 dementia 6 debill limiting 7 Pneu 8 Afib reg this am 9 CAD P HD in am, Lasix, follow Hb, check PTH, cont AB   LOS: 12 days   Quintavia Rogstad L 09/21/2015,7:49 AM

## 2015-09-21 NOTE — Progress Notes (Signed)
Attempted to give report to 3E, RN unable to receive patient at this time due to other patients needs.will try again later

## 2015-09-21 NOTE — Care Management Note (Signed)
Case Management Note  Patient Details  Name: [name] MRN: [id number] Date of Birth: [birth year]  Subjective/Objective:     Pt admitted with CHF              Action/Plan:  PTA - from home with wife, used walker with ambulation.  Recommendation is for SNF at [place], wife in agreement.  CSW following   Expected Discharge Date:                  Expected Discharge Plan:  [place]  In-House Referral:  Clinical Social Work  Discharge planning Services  CM Consult  Post Acute Care Choice:    Choice offered to:     DME Arranged:    DME Agency:     HH Arranged:    [place] Agency:     Status of Service:  In process, will continue to follow  Medicare Important Message Given:  Yes Date Medicare IM Given:    Medicare IM give by:    Date Additional Medicare IM Given:    Additional Medicare Important Message give by:     If discussed at [place] of Stay Meetings, dates discussed:    Additional Comments:  Maryclare Labrador, RN 09/21/2015, 10:30 AM

## 2015-09-21 NOTE — Progress Notes (Signed)
Physical Therapy Treatment Patient Details Name: [name] MRN: [id number] DOB: 1933-07-12 Today's Date: 09/21/2015    History of Present Illness [name] is a 82 y.o. male with complaint of 2-3 week history of lower extremity swelling, cough, dyspnea, and recent visit with primary physician, Dr. Philip Aspen who informed the patient and his daughter that he was in heart failure.  pt with fistula for anticipated HD. Rapid Response called due to respiratory distress and pt transferred to ICU 3/26.    PT Comments    Patient better able to maintain Sp02 in 90s on high flow [place] today. Continues to have copious secretions. Reviewed huffing technique to help cough up phgelm. Tolerated shuffling feet to get to chair with assist of 2 due to weakness and posterior lean. Will continue to follow and progress as tolerated.   Follow Up Recommendations  SNF;Supervision/Assistance - 24 hour     Equipment Recommendations  None recommended by PT    Recommendations for Other Services       Precautions / Restrictions Precautions Precautions: Fall Precaution Comments: high flow [place] Restrictions Weight Bearing Restrictions: No    Mobility  Bed Mobility Overal bed mobility: Needs Assistance Bed Mobility: Supine to Sit     Supine to sit: Mod assist;HOB elevated     General bed mobility comments: Cues for technique. Able to bring BLEs to EOB, assist to elevate trunk and scoot bottom to EOB. + secretions and coughing. No dizziness.  Transfers Overall transfer level: Needs assistance Equipment used: 2 person hand held assist Transfers: Sit to/from [facility] Sit to Stand: Mod assist;+2 physical assistance         General transfer comment: Assist of 2 to stand from EOB with cues for hand placement. Therapist assisting with knee extension to prevent knee buckling. manual cues for hip extension and upright posture. Uncontrolled descent into  chair.  Ambulation/Gait Ambulation/Gait assistance: Mod assist;+2 physical assistance Ambulation Distance (Feet): 3 Feet Assistive device: 2 person hand held assist Gait Pattern/deviations: Shuffle;Trunk flexed     General Gait Details: Able to shuffle feet towards chair with assist weight shifting LEs. SP02 remained in 90s on high flow [place].   Stairs            Wheelchair Mobility    Modified Rankin (Stroke Patients Only)       Balance Overall balance assessment: Needs assistance Sitting-balance support: Feet supported;No upper extremity supported Sitting balance-Leahy Scale: Fair Sitting balance - Comments: Able to sit unsupported EOB x10 mins   Standing balance support: During functional activity;Bilateral upper extremity supported Standing balance-Leahy Scale: Poor Standing balance comment: Requires assist of 2 to stand and for dynamic standing with heavy posterior lean through hips                    Cognition Arousal/Alertness: Awake/alert Behavior During Therapy: Flat affect Overall Cognitive Status: History of cognitive impairments - at baseline                      Exercises General Exercises - Lower Extremity Long Arc Quad: Both;5 reps;Seated    General Comments        Pertinent Vitals/Pain Pain Assessment: No/denies pain Faces Pain Scale: No hurt    Home Living                      Prior Function            PT Goals (current  goals can now be found in the care plan section) Progress towards PT goals: Progressing toward goals (slowly)    Frequency  Min 3X/week    PT Plan Current plan remains appropriate    Co-evaluation PT/OT/SLP Co-Evaluation/Treatment: Yes Reason for Co-Treatment: Complexity of the patient's impairments (multi-system involvement);For patient/therapist safety PT goals addressed during session: Mobility/safety with mobility       End of Session Equipment Utilized During Treatment: Gait  belt;Oxygen Activity Tolerance: Patient tolerated treatment well Patient left: in chair;with call bell/phone within reach;with chair alarm set     Time: 1131-1156 PT Time Calculation (min) (ACUTE ONLY): 25 min  Charges:  $Therapeutic Activity: 8-22 mins                    G Codes:      Rowynn Mcweeney A Marcelo Ickes 09/21/2015, 12:52 PM Wray Kearns, [place], DPT [phone]

## 2015-09-21 NOTE — Progress Notes (Signed)
ANTICOAGULATION Consult Note - Follow up consult  Pharmacy Consult for Coumadin  Indication: atrial fibrillation  Allergies  Allergen Reactions  . Betadine [Povidone Iodine] Rash  . Iodinated Diagnostic Agents Rash and Other (See Comments)    Does fine with premedications for cath  . Latex Hives    Patient Measurements: Height: 5\' 10"  (177.8 cm) Weight: 181 lb (82.1 kg) IBW/kg (Calculated) : 73  Vital Signs: Temp: 98 F (36.7 C) (03/28 0841) Temp Source: Oral (03/28 0841) BP: 132/50 mmHg (03/28 1000) Pulse Rate: 53 (03/28 1000)  Labs:  Recent Labs  09/19/15 0341 09/20/15 0314 09/21/15 0951  HGB  --   --  10.8*  HCT  --   --  36.0*  PLT  --   --  150  LABPROT 41.9* 44.6* 36.4*  INR 4.56* 4.96* 3.77*  CREATININE 3.68* 3.41* 4.34*    Estimated Creatinine Clearance: 14.3 mL/min (by C-G formula based on Cr of 4.34).   Assessment: 69 yom admitted 09/09/2015 with SOB. Patient on coumadin PTA for AFib. Pharmacy consulted to continue dosing inpatient.   INR trended up to 4.96 yesterday despite no coumadin since 3/23. LFTs only mildly elevated. Today's INR remains above goal but is trending down to 3.77.  On amiodarone PTA which has been stopped due to bradycardia.    PTA coumadin dose = 2.5mg  Mon/Tue/Wed/Sat and 5mg  Sun/Thurs  Goal of Therapy:  INR 2-3 Monitor platelets by anticoagulation protocol: Yes   Plan:  1) No coumadin tonight 2) Daily INR  Nena Jordan, PharmD, BCPS 09/21/2015, 11:18 AM

## 2015-09-21 NOTE — Progress Notes (Signed)
Patient ID: [name], male   DOB: 1933/12/08, 82 y.o.   MRN: [id number]    Advanced Heart Failure Rounding Note   Subjective:    Started HD 3/25. Seems to be tolerating well. Remains on high dose lasix. 09/19/15 he was transferred to ICU due to acute respiratory distress. Placed on Bipap. Weaned to NRB.   Now stable on 02 via [place].   Still coughing some. Feeling OK this am.  Slept well.  All labs pending for today.   09/10/2015: EF 60-65% MV severely calcified. Grade II DD . Moderate AS    Objective:   Weight Range:  Vital Signs:   Temp:  [97.8 F (36.6 C)-98.4 F (36.9 C)] 98 F (36.7 C) (03/28 0841) Pulse Rate:  [53-60] 54 (03/28 0900) Resp:  [15-32] 24 (03/28 0900) BP: (116-140)/(43-70) 135/47 mmHg (03/28 0900) SpO2:  [94 %-99 %] 97 % (03/28 0900) Weight:  [174 lb 13.2 oz (79.3 kg)-181 lb (82.1 kg)] 181 lb (82.1 kg) (03/28 0500) Last BM Date: 09/18/15  Weight change: Filed Weights   09/20/15 1134 09/20/15 1457 09/21/15 0500  Weight: 179 lb 3.7 oz (81.3 kg) 174 lb 13.2 oz (79.3 kg) 181 lb (82.1 kg)    Intake/Output:   Intake/Output Summary (Last 24 hours) at 09/21/15 0914 Last data filed at 09/21/15 0600  Gross per 24 hour  Intake  244.5 ml  Output   2520 ml  Net -2275.5 ml     Physical Exam: General:  Chronically ill appearing and Elderly appearing. NAD. Inb ed.  HEENT: normal Neck: supple. JVP 8-9 cm.  Carotids 2+ bilat; no bruits. No thyromegaly or nodule noted.  Cor: PMI nondisplaced. Regular rate & rhythm. No rubs, gallops. 3/6 HSM RUSB.  Lungs: RML RLL LLL crackles on 60%  oxygen.  Abdomen: soft, nontender, nondistended. No hepatosplenomegaly. No bruits or masses. Good bowel sounds. Extremities: no cyanosis, clubbing, rash.  R and LLE SCDs. R and LLE trace edema. LUE AVF  Neuro: alert & orientedx3, cranial nerves grossly intact. moves all 4 extremities w/o difficulty. Affect pleasant GU: Foley.   Telemetry: Reviewed,  Sinus Brady 50-60s    Labs: Basic Metabolic Panel:  Recent Labs Lab 09/16/15 0534 09/17/15 0540 09/18/15 0337 09/19/15 0341 09/20/15 0314  NA 138 136 135 137 139  K 3.9 3.8 4.0 4.9 4.6  CL 94* 95* 93* 97* 100*  CO2 27 30 29 28 25   GLUCOSE 211* 207* 165* 139* 105*  BUN 87* 93* 103* 79* 56*  CREATININE 3.04* 3.19* 3.54* 3.68* 3.41*  CALCIUM 8.8* 8.8* 8.8* 8.8* 8.4*  MG  --  2.2  --   --   --   PHOS  --  5.3*  --   --   --     Liver Function Tests:  Recent Labs Lab 09/17/15 0540  ALBUMIN 2.9*   No results for input(s): LIPASE, AMYLASE in the last 168 hours. No results for input(s): AMMONIA in the last 168 hours.  CBC:  Recent Labs Lab 09/15/15 0441 09/18/15 0337  WBC 7.1 16.3*  HGB 10.4* 11.0*  HCT 34.1* 35.8*  MCV 88.6 89.5  PLT 120* 136*    Cardiac Enzymes: No results for input(s): CKTOTAL, CKMB, CKMBINDEX, TROPONINI in the last 168 hours.  BNP: BNP (last 3 results)  Recent Labs  09/09/15 1326 09/09/15 1542  BNP 605.6* 729.6*    ProBNP (last 3 results) No results for input(s): PROBNP in the last 8760 hours.    Other results:  Imaging: Dg Chest Port 1 View  09/19/2015  CLINICAL DATA:  Respiratory distress with hypoxia. EXAM: PORTABLE CHEST 1 VIEW COMPARISON:  None. FINDINGS: The heart is enlarged. There has been previous CABG. LEFT lower lobe atelectasis, effusion, and possible infiltrate are redemonstrated, similar to priors. Diffuse BILATERAL airspace opacity suggests edema, and may be slightly improved. IMPRESSION: Slight improvement aeration from priors. Persistent BILATERAL chest abnormalities. Electronically Signed   By: Staci Righter M.D.   On: 09/19/2015 15:50     Medications:     Scheduled Medications: . antiseptic oral rinse  7 mL Mouth Rinse q12n4p  . atorvastatin  80 mg Oral q1800  . benzonatate  100 mg Oral BID  . chlorhexidine  15 mL Mouth Rinse BID  . cyanocobalamin  1,000 mcg Intramuscular Once  . feeding supplement (NEPRO CARB STEADY)  237  mL Oral TID BM  . ferumoxytol  510 mg Intravenous Weekly  . furosemide  160 mg Oral TID  . insulin aspart  0-15 Units Subcutaneous TID WC  . insulin aspart  0-5 Units Subcutaneous QHS  . insulin glargine  15 Units Subcutaneous Daily  . isosorbide mononitrate  60 mg Oral Daily  . levothyroxine  175 mcg Oral QAC breakfast  . memantine  5 mg Oral Daily  . multivitamin  1 tablet Oral QHS  . pantoprazole  40 mg Oral Q0600  . piperacillin-tazobactam (ZOSYN)  IV  2.25 g Intravenous 3 times per day  . sodium chloride flush  3 mL Intravenous Q12H  . Warfarin - Pharmacist Dosing Inpatient   Does not apply q1800    Infusions:    PRN Medications: sodium chloride, sodium chloride, sodium chloride, sodium chloride, sodium chloride, acetaminophen, albuterol, alteplase, guaifenesin, heparin, lidocaine (PF), lidocaine-prilocaine, ondansetron (ZOFRAN) IV, pentafluoroprop-tetrafluoroeth, sodium chloride flush   Assessment/ Plan /Discussion.    1. Acute on chronic diastolic CHF: EF [id number], moderate diastolic dysfunction.Remains with marked volume overload still.  -Started on HD 3/25. . 2. CKD: Stage IV - Started HD 3/25. Next HD for tomorrow AM.  - Labs pending. Will have nurse follow up.  3. Atrial fibrillation: Paroxysmal, in NSR. No beta blocker with bradycardia. Continue warfarin. INR pending. Pharmacy dosing.   4. CAD: s/p CABG, stable. Continue statin.  5. AS: Moderate on echo.  6. Mitral stenosis: Mild on echo.  7. Hypothyroidism:TSH 1.5 . Continue 175 mcg synthroid. 8. HTN: BP ok.  - Continue amlodopine at 5 mg - Will eventually need to resume hydralazine.  9. Anemia: Iron stores low. Got Fe this admission.  10. Dementia 11. Deconditioning: PT following. Recommending SNF. SW following for SNF 12.  Poor PO intake: Dietician input appreciated.   13. Dysuria: UA- many bacteria.  Urine culture pending/reincubated for better growth. On Zosyn and Vanc.   14. Acute/Chronic Respiratory  Failure - Transferred to ICU on NRB on 3./26.  - Now stable on [place] via 02.   Will place Palliative Care Consult.   Shirley Friar PA-C 09/21/2015 9:14 AM   Advanced Heart Failure Team Pager [phone] (M-F; 7a - 4p)  Please contact [facility] for night-coverage after hours (4p -7a ) and weekends on [email or web address]  Patient seen with PA, agree with the above note.  Stable today, denies dyspnea.  Minimal urine output despite high dose po Lasix.  Will have HD again tomorrow.   Suspect UTI, on vancomycin/Zosyn currently.  Think we can stop vancomycin, continue Zosyn until cultures come back.   Deconditioning: will need SNF, hopefully home later  this week.  Needs to work with PT.   Loralie Champagne 09/21/2015 10:05 AM

## 2015-09-22 DIAGNOSIS — Z515 Encounter for palliative care: Secondary | ICD-10-CM

## 2015-09-22 DIAGNOSIS — Z7189 Other specified counseling: Secondary | ICD-10-CM

## 2015-09-22 LAB — GLUCOSE, CAPILLARY
GLUCOSE-CAPILLARY: 117 mg/dL — AB (ref 65–99)
GLUCOSE-CAPILLARY: 145 mg/dL — AB (ref 65–99)
GLUCOSE-CAPILLARY: 153 mg/dL — AB (ref 65–99)
Glucose-Capillary: 100 mg/dL — ABNORMAL HIGH (ref 65–99)
Glucose-Capillary: 151 mg/dL — ABNORMAL HIGH (ref 65–99)

## 2015-09-22 LAB — CBC
HEMATOCRIT: 31.8 % — AB (ref 39.0–52.0)
Hemoglobin: 10 g/dL — ABNORMAL LOW (ref 13.0–17.0)
MCH: 28.1 pg (ref 26.0–34.0)
MCHC: 31.4 g/dL (ref 30.0–36.0)
MCV: 89.3 fL (ref 78.0–100.0)
Platelets: 139 10*3/uL — ABNORMAL LOW (ref 150–400)
RBC: 3.56 MIL/uL — ABNORMAL LOW (ref 4.22–5.81)
RDW: 16.3 % — AB (ref 11.5–15.5)
WBC: 9.5 10*3/uL (ref 4.0–10.5)

## 2015-09-22 LAB — BASIC METABOLIC PANEL
ANION GAP: 13 (ref 5–15)
BUN: 72 mg/dL — ABNORMAL HIGH (ref 6–20)
CO2: 25 mmol/L (ref 22–32)
Calcium: 8.3 mg/dL — ABNORMAL LOW (ref 8.9–10.3)
Chloride: 98 mmol/L — ABNORMAL LOW (ref 101–111)
Creatinine, Ser: 5.3 mg/dL — ABNORMAL HIGH (ref 0.61–1.24)
GFR, EST AFRICAN AMERICAN: 11 mL/min — AB (ref >60)
GFR, EST NON AFRICAN AMERICAN: 9 mL/min — AB (ref >60)
GLUCOSE: 117 mg/dL — AB (ref 65–99)
POTASSIUM: 4.1 mmol/L (ref 3.5–5.1)
Sodium: 136 mmol/L (ref 135–145)

## 2015-09-22 LAB — PROTIME-INR
INR: 3.94 — AB (ref 0.00–1.49)
Prothrombin Time: 37.6 seconds — ABNORMAL HIGH (ref 11.6–15.2)

## 2015-09-22 LAB — URINE CULTURE: Culture: >=100000

## 2015-09-22 MED ORDER — PENTAFLUOROPROP-TETRAFLUOROETH EX AERO: 1.0000 "application " | INHALATION_SPRAY | CUTANEOUS | Status: DC | PRN

## 2015-09-22 MED ORDER — SODIUM CHLORIDE 0.9 % IV SOLN: 100.0000 mL | INTRAVENOUS | Status: DC | PRN

## 2015-09-22 MED ORDER — LIDOCAINE HCL (PF) 1 % IJ SOLN: 5.0000 mL | INTRAMUSCULAR | Status: DC | PRN

## 2015-09-22 MED ORDER — ISOSORBIDE MONONITRATE ER 60 MG PO TB24
60.0000 mg | ORAL_TABLET | Freq: Every day | ORAL | Status: DC
  Administered 2015-09-22 – 2015-09-29 (×7): 60 mg via ORAL
  Filled 2015-09-22 (×7): qty 1

## 2015-09-22 MED ORDER — HEPARIN SODIUM (PORCINE) 1000 UNIT/ML DIALYSIS: 1000.0000 [IU] | INTRAMUSCULAR | Status: DC | PRN

## 2015-09-22 MED ORDER — SULFAMETHOXAZOLE-TRIMETHOPRIM 800-160 MG PO TABS
1.0000 | ORAL_TABLET | Freq: Every day | ORAL | Status: DC
  Administered 2015-09-22 – 2015-09-23 (×2): 1 via ORAL
  Filled 2015-09-22 (×5): qty 1

## 2015-09-22 MED ORDER — CIPROFLOXACIN HCL 500 MG PO TABS
500.0000 mg | ORAL_TABLET | Freq: Every day | ORAL | Status: DC
  Filled 2015-09-22: qty 1

## 2015-09-22 MED ORDER — DARBEPOETIN ALFA 60 MCG/0.3ML IJ SOSY
PREFILLED_SYRINGE | INTRAMUSCULAR | Status: AC
  Administered 2015-09-22: 60 ug via INTRAVENOUS
  Filled 2015-09-22: qty 0.3

## 2015-09-22 MED ORDER — ALTEPLASE 2 MG IJ SOLR: 2.0000 mg | Freq: Once | INTRAMUSCULAR | Status: DC | PRN

## 2015-09-22 MED ORDER — LIDOCAINE-PRILOCAINE 2.5-2.5 % EX CREA: 1.0000 "application " | TOPICAL_CREAM | CUTANEOUS | Status: DC | PRN

## 2015-09-22 MED ORDER — HEPARIN SODIUM (PORCINE) 1000 UNIT/ML DIALYSIS
100.0000 [IU]/kg | INTRAMUSCULAR | Status: DC | PRN
  Administered 2015-09-22: 8200 [IU] via INTRAVENOUS_CENTRAL
  Filled 2015-09-22 (×2): qty 9

## 2015-09-22 MED ORDER — DARBEPOETIN ALFA 60 MCG/0.3ML IJ SOSY
60.0000 ug | PREFILLED_SYRINGE | INTRAMUSCULAR | Status: DC
  Administered 2015-09-22: 60 ug via INTRAVENOUS
  Filled 2015-09-22 (×2): qty 0.3

## 2015-09-22 NOTE — Care Management Important Message (Signed)
Important Message  Patient Details  Name: RAMAN BLYDEN MRN: [id number] Date of Birth: [birth year]   Medicare Important Message Given:  Yes    Terasa Orsini P [place] 09/22/2015, 10:30 AM

## 2015-09-22 NOTE — Procedures (Signed)
I was present at this session.  I have reviewed the session itself and made appropriate changes.  Using LUA AVF.  tol HD and vol off. 16 ga needles.  Alaila Pillard L 3/29/201710:46 AM

## 2015-09-22 NOTE — Progress Notes (Signed)
Patient arrived to unit per recliner.  Reviewed treatment plan and this RN agrees.  Report received from bedside RN, Deneise Lever.  Consent verified.  Patient Oriented to self, place. Lung sounds diminished to ausculation in all fields. Generalized  edema. Cardiac: Bradycardic.  Prepped LUAVF with alcohol and cannulated with two 17 gauge needles.  Pulsation of blood noted.  Flushed access well with saline per protocol.  Connected and secured lines and initiated tx at 0943.  UF goal of 4000 mL and net fluid removal of 3500 mL.  Will continue to monitor.

## 2015-09-22 NOTE — Progress Notes (Signed)
Subjective: Interval History: has no complaint, up in chair at HD.  Objective: Vital signs in last 24 hours: Temp:  [97.6 F (36.4 C)-98.3 F (36.8 C)] 98 F (36.7 C) (03/29 0932) Pulse Rate:  [50-86] 86 (03/29 1028) Resp:  [19-35] 19 (03/29 0932) BP: (114-133)/(44-95) 121/95 mmHg (03/29 1028) SpO2:  [94 %-99 %] 96 % (03/29 0524) Weight:  [82.9 kg (182 lb 12.2 oz)-82.963 kg (182 lb 14.4 oz)] 82.9 kg (182 lb 12.2 oz) (03/29 0932) Weight change: 1.663 kg (3 lb 10.7 oz)  Intake/Output from previous day: 03/28 0701 - 03/29 0700 In: 640 [P.O.:240; NG/GT:240; IV Piggyback:160] Out: 75 [Urine:75] Intake/Output this shift: Total I/O In: 120 [P.O.:120] Out: -   General appearance: cooperative, pale and slowed mentation Resp: rales bibasilar and rhonchi bibasilar Cardio: systolic murmur: systolic ejection 2/6, crescendo at 2nd left intercostal space GI: pos bs, soft Extremities: AVF LUA, 2 + edema  Lab Results:  Recent Labs  09/21/15 0951 09/22/15 0930  WBC 12.6* 9.5  HGB 10.8* 10.0*  HCT 36.0* 31.8*  PLT 150 139*   BMET:  Recent Labs  09/21/15 0951 09/22/15 0254  NA 137 136  K 4.4 4.1  CL 97* 98*  CO2 27 25  GLUCOSE 148* 117*  BUN 57* 72*  CREATININE 4.34* 5.30*  CALCIUM 8.4* 8.3*    Recent Labs  09/20/15 0925  PTH 53   Iron Studies: No results for input(s): IRON, TIBC, TRANSFERRIN, FERRITIN in the last 72 hours.  Studies/Results: No results found.  I have reviewed the patient's current medications.  Assessment/Plan: 1 ESRD for HD.  No response to Lasix so d/c.  Will get CLIP and if tol being up d/c 2 Anemia stable 3 HPTH meds 4 Dementia  5 Afib anticoag 6 CAD stable P HD, vit D, mobilize, CLIP    LOS: 13 days   Kynlie Jane L 09/22/2015,10:43 AM

## 2015-09-22 NOTE — Progress Notes (Signed)
Physical Therapy Treatment Patient Details Name: [name] MRN: [id number] DOB: 17-Jan-1934 Today's Date: 09/22/2015    History of Present Illness [name] is a 82 y.o. male with complaint of 2-3 week history of lower extremity swelling, cough, dyspnea, and recent visit with primary physician, Dr. Philip Aspen who informed the patient and his daughter that he was in heart failure.  pt with fistula for anticipated HD. Rapid Response called due to respiratory distress and pt transferred to ICU 3/26.    PT Comments    Patient seen for OOB mobility in conjunction with OOB to recliner for HD per MD request. Patient saturations on 6 liters HF at 89%. Tolerated OOB transfer with +2 persons assist. Patient very limited with standing tolerance. Will continue to see and progress as tolerated.   Follow Up Recommendations  SNF;Supervision/Assistance - 24 hour     Equipment Recommendations  None recommended by PT    Recommendations for Other Services       Precautions / Restrictions Precautions Precautions: Fall Precaution Comments: oxygen 6 liters    Mobility  Bed Mobility Overal bed mobility: Needs Assistance Bed Mobility: Supine to Sit     Supine to sit: Mod assist;HOB elevated     General bed mobility comments: VCs for initiation of movement to EOB. Manual assist to rotate and elevate trunk to EOB (2 persons)Cues for technique. assist to scoot bottom to EOB. Patient with moaning throughout transitional movement.  Transfers Overall transfer level: Needs assistance Equipment used: 2 person hand held assist Transfers: Sit to/from [facility] Sit to Stand: Mod assist;+2 physical assistance Stand pivot transfers: Mod assist;+2 physical assistance       General transfer comment: Face to face transfer with gait belt and chuck pad +2 assist. LE blocking during transition to chair. Patient with poor ability to move LEs during pivot.  Ambulation/Gait                  Stairs            Wheelchair Mobility    Modified Rankin (Stroke Patients Only)       Balance   Sitting-balance support: Bilateral upper extremity supported Sitting balance-Leahy Scale: Fair     Standing balance support: During functional activity Standing balance-Leahy Scale: Poor Standing balance comment: required 2 person assist during transfer                    Cognition Arousal/Alertness: Awake/alert Behavior During Therapy: Flat affect Overall Cognitive Status: History of cognitive impairments - at baseline                      Exercises      General Comments        Pertinent Vitals/Pain Pain Assessment: Faces Faces Pain Scale: Hurts a little bit    Home Living                      Prior Function            PT Goals (current goals can now be found in the care plan section) Acute Rehab PT Goals Patient Stated Goal: none stated PT Goal Formulation: With patient/family Time For Goal Achievement: 09/22/15 Potential to Achieve Goals: Fair Progress towards PT goals: Progressing toward goals (modest)    Frequency  Min 3X/week    PT Plan Current plan remains appropriate    Co-evaluation  End of Session Equipment Utilized During Treatment: Gait belt;Oxygen Activity Tolerance: Patient tolerated treatment well Patient left: in chair (recliner for HD)     Time: [id number] PT Time Calculation (min) (ACUTE ONLY): 16 min  Charges:  $Therapeutic Activity: 8-22 mins                    G CodesDuncan Dull 10-13-15, 4:08 PM [name], [place] DPT  [phone]

## 2015-09-22 NOTE — Progress Notes (Signed)
Pharmacy Antibiotic Note  [name] is a 82 y.o. male admitted on 09/09/2015. Pharmacy has been consulted for ciprofloxacin dosing for UTI.  Plan: -Ciprofloxacin 50mg  daily -Monitor cultures, LOT, clinical progress   Height: 5\' 10"  (177.8 cm) Weight: 182 lb 12.2 oz (82.9 kg) IBW/kg (Calculated) : 73  Temp (24hrs), Avg:98 F (36.7 C), Min:97.6 F (36.4 C), Max:98.3 F (36.8 C)   Recent Labs Lab 09/18/15 0337 09/19/15 0341 09/20/15 0314 09/21/15 0951 09/22/15 0254 09/22/15 0930  WBC 16.3*  --   --  12.6*  --  9.5  CREATININE 3.54* 3.68* 3.41* 4.34* 5.30*  --   VANCORANDOM  --   --  14  --   --   --     Estimated Creatinine Clearance: 11.7 mL/min (by C-G formula based on Cr of 5.3).    Allergies  Allergen Reactions  . Betadine [Povidone Iodine] Rash  . Iodinated Diagnostic Agents Rash and Other (See Comments)    Does fine with premedications for cath  . Latex Hives    Antimicrobials this admission: Vancomycin 3/26>>3/28 Zosyn 3/26>>3/29  Microbiology results: 3/26 BCx: ngtd 3/26 UCx: e.coli pan-sensitive and kleb resistant to amp 3/26 RespCx: pending MRSA PCR Negative  Thank you for allowing pharmacy to be a part of this patient's care.  Melburn Popper, PharmD Clinical Pharmacy Resident 09/22/2015 10:38 AM

## 2015-09-22 NOTE — Consult Note (Signed)
Consultation Note Date: 09/22/2015   Patient Name: [name]  DOB: 04-Aug-1933  MRN: [id number]  Age / Sex: 82 y.o., male  PCP: Leanna Battles, MD Referring Physician: Belva Crome, MD  Reason for Consultation: Establishing goals of care  Clinical Assessment/Narrative: 82 year old male with PMHx HTN, HLP, CAD, DM, PVD, CAD s/p CABG, CKD, Afib, admitted with volume overload and recently started on dialysis.  Palliative consulted for goals of care.  I met with patient and his wife this AM, and then with his son/HCPOA, Aaron Edelman. We had a long discussion regarding his father's chronic illnesses and the fact that he has been continuing decline with progressive dementia over the past several months to years.  Aaron Edelman I discussed pathways moving forward as well as the fact that his father has multiple comorbid conditions that may make his ability to tolerate dialysis limited.  We also discussed how to create a care plan moving forward to focus on interventions that are likely to allow his father to enjoy as much quality time with his family as possible. He was very receptive to conversation and would like to take time to discuss with rest of his family this evening. We'll plan to meet again tomorrow.  Contacts/Participants in Discussion: Patient, his son, and brief conversation with his wife this morning Primary Decision Maker: Arnette Felts   Relationship to Patient son HCPOA: Maximillian Habibi (copy on physical chart)  SUMMARY OF RECOMMENDATIONS - I discussed separately with patient wife and son today. - His son is POA - He would like to discuss further with his family.  He seems invested in plan to complete MOST form to establish limits on aggressiveness of care while pursuing time limited trial of dialysis.  We discussed his hope with dialysis being to add time and quality of to his father's life.  We discussed that based on his  age, low albumin, dementia, poor functional status, and initiation of dialysis, he is at high risk of decompensation and death in the near future. - Will plan to meet again tomorrow.  Plan to complete MOST form at that time.  Code Status/Advance Care Planning: Full code    Code Status Orders        Start     Ordered   09/09/15 1529  Full code   Continuous     09/09/15 1529    Code Status History    Date Active Date Inactive Code Status Order ID Comments User Context   03/24/2014 12:41 AM 03/24/2014  6:34 PM Full Code [id number]  Shanda Howells, MD ED   03/04/2014  4:32 PM 03/07/2014  3:54 PM Full Code [id number]  Lonn Georgia, PA-C ED    Advance Directive Documentation        Most Recent Value   Type of Advance Directive  Healthcare Power of Attorney   Pre-existing out of facility DNR order (yellow form or pink MOST form)     "MOST" Form in Place?       Symptom Management:   Continue same  Palliative Prophylaxis:   Aspiration, Delirium Protocol and Frequent Pain Assessment  Psycho-social/Spiritual:  Support System: Strong Desire for further Chaplaincy support:Did not address this visit Additional Recommendations: Caregiving  Support/Resources  Prognosis: Unable to determine  Discharge Planning: [place] for rehab with Palliative care service follow-up   Chief Complaint/ Primary Diagnoses: Present on Admission:  . Acute on chronic diastolic heart failure ([place]) . Atrial fibrillation ([place]) . Chronic combined systolic and  diastolic CHF (congestive heart failure) ([place]) . CKD (chronic kidney disease), stage IV ([place]) . Hypertensive heart disease with heart failure ([place])  I have reviewed the medical record, interviewed the patient and family, and examined the patient. The following aspects are pertinent.  Past Medical History  Diagnosis Date  . Hypertension   . Hyperlipidemia   . Hypothyroidism   . CAD (coronary artery disease)     a. prior CABG in  1997 with redo in 2000. b. last cath in 2008 - managed medically  . Type 2 diabetes mellitus ([place])   . Generalized weakness     without overt findings  . Peripheral vascular disease ([place])   . Carotid disease, bilateral ([place])     with multiple bilateral carotid surgeries  . Psoriasis   . Meniere's disease   . Degenerative joint disease   . Gout   . Orthopnea     Two-pillow  . CKD (chronic kidney disease), stage IV ([place])     a. Has fistula in place.   . Chronic diastolic CHF (congestive heart failure) ([place])   . GERD (gastroesophageal reflux disease)   . Atrial fibrillation ([place]) Aug. 2015    a. Dx 01/2014, s/p DCCV 03/06/14.  . Sinus bradycardia     a. Toprol D/C'd due to this.   . Shingles   . On home oxygen therapy     "2L" qhs (09/09/2015  . Dementia    Social History   Social History  . Marital Status: Married    Spouse Name: N/A  . Number of Children: N/A  . Years of Education: N/A   Occupational History  . Retired    Social History Main Topics  . Smoking status: Never Smoker   . Smokeless tobacco: Never Used  . Alcohol Use: No  . Drug Use: No  . Sexual Activity: Not Currently   Other Topics Concern  . None   Social History Narrative   Family History  Problem Relation Age of Onset  . Heart attack Father   . Heart disease Father     After 15 yrs of age  . Hyperlipidemia Father   . Hypertension Father   . Diabetes Mother   . Hypertension Mother   . Heart disease Mother   . Heart disease Brother   . Diabetes Brother    Scheduled Meds: . antiseptic oral rinse  7 mL Mouth Rinse q12n4p  . atorvastatin  80 mg Oral q1800  . benzonatate  100 mg Oral BID  . chlorhexidine  15 mL Mouth Rinse BID  . cyanocobalamin  1,000 mcg Intramuscular Once  . darbepoetin (ARANESP) injection - DIALYSIS  60 mcg Intravenous Q Wed-HD  . feeding supplement (NEPRO CARB STEADY)  237 mL Oral TID BM  . insulin aspart  0-15 Units Subcutaneous TID WC  . insulin aspart  0-5 Units  Subcutaneous QHS  . insulin glargine  15 Units Subcutaneous Daily  . isosorbide mononitrate  60 mg Oral QHS  . levothyroxine  175 mcg Oral QAC breakfast  . memantine  5 mg Oral Daily  . multivitamin  1 tablet Oral QHS  . pantoprazole  40 mg Oral Q0600  . sodium chloride flush  3 mL Intravenous Q12H  . sulfamethoxazole-trimethoprim  1 tablet Oral Daily  . Warfarin - Pharmacist Dosing Inpatient   Does not apply q1800   Continuous Infusions:  PRN Meds:.sodium chloride, sodium chloride, sodium chloride, sodium chloride, sodium chloride, acetaminophen, albuterol, alteplase, guaifenesin, heparin, lidocaine (PF), lidocaine-prilocaine, ondansetron (  ZOFRAN) IV, pentafluoroprop-tetrafluoroeth, sodium chloride flush Medications Prior to Admission:  Prior to Admission medications   Medication Sig Start Date End Date Taking? Authorizing Provider  amiodarone (PACERONE) 200 MG tablet Take 1 tablet (200 mg total) by mouth daily. 03/18/14  Yes Dayna N Dunn, PA-C  amLODipine (NORVASC) 10 MG tablet TAKE ONE TABLET BY MOUTH ONCE DAILY 02/15/15  Yes Thayer Headings, MD  atorvastatin (LIPITOR) 80 MG tablet TAKE ONE TABLET BY MOUTH ONCE DAILY 02/26/15  Yes Thayer Headings, MD  cholecalciferol (VITAMIN D) 1000 UNITS tablet Take 1,000 Units by mouth daily at 6 PM.    Yes Historical Provider, MD  doxazosin (CARDURA) 4 MG tablet Take 2 tablets (8 mg total) by mouth daily. 12/02/14  Yes Thayer Headings, MD  furosemide (LASIX) 80 MG tablet Take one and half (1.5) tablet (120 mg total) by mouth twice daily. 09/06/15  Yes Historical Provider, MD  Insulin Glargine (LANTUS SOLOSTAR) 100 UNIT/ML SOPN Inject 15 Units into the skin every morning.    Yes Historical Provider, MD  isosorbide mononitrate (IMDUR) 60 MG 24 hr tablet TAKE ONE TABLET BY MOUTH ONCE DAILY 04/30/15  Yes Thayer Headings, MD  KLOR-CON M20 20 MEQ tablet Take 20 mEq by mouth daily. 10/31/14  Yes Historical Provider, MD  levothyroxine (SYNTHROID, LEVOTHROID) 175 MCG  tablet Take 175 mcg by mouth daily before breakfast.   Yes Historical Provider, MD  memantine (NAMENDA) 5 MG tablet Take 5 mg by mouth daily.  03/12/15  Yes Historical Provider, MD  Multiple Vitamin (MULTIVITAMIN WITH MINERALS) TABS Take 1 tablet by mouth every evening.    Yes Historical Provider, MD  nitroGLYCERIN (NITROSTAT) 0.4 MG SL tablet Place 1 tablet (0.4 mg total) under the tongue every 5 (five) minutes as needed for chest pain. 05/09/13  Yes Thayer Headings, MD  polyethylene glycol [facility] / Floria Raveling) packet Take 17 g by mouth daily as needed for moderate constipation.   Yes Historical Provider, MD  ranitidine (ZANTAC) 300 MG tablet Take 300 mg by mouth 2 (two) times daily.  09/20/10  Yes Thayer Headings, MD  trolamine salicylate (ASPERCREME) 10 % cream Apply 1 application topically 2 (two) times daily as needed for muscle pain.    Yes Historical Provider, MD  warfarin (COUMADIN) 5 MG tablet TAKE AS DIRECTED Patient taking differently: TAKE AS DIRECTED AS PER CARDIOLOGIST 07/09/15  Yes Thayer Headings, MD  insulin aspart (NOVOLOG FLEXPEN) 100 UNIT/ML SOPN FlexPen Inject 10 Units into the skin 2 (two) times daily. Reported on 09/09/2015    Historical Provider, MD  silver sulfADIAZINE (SILVADENE) 1 % cream Apply 1 application topically daily. 03/30/14   Gean Birchwood, DPM   Allergies  Allergen Reactions  . Betadine [Povidone Iodine] Rash  . Iodinated Diagnostic Agents Rash and Other (See Comments)    Does fine with premedications for cath  . Latex Hives    Review of Systems Denies complaints, but unreliable historian  Physical Exam  General: Alert, awake, in no acute distress. Chronically, ill appearing male. Confused. HEENT: + JVD Heart: Regular rate and rhythm. SEM noted. Lungs: Good air movement, + crackles  In bases Abdomen: Soft, nontender, nondistended, positive bowel sounds.  Ext: + edema Skin: Warm and dry Neuro: Grossly intact, nonfocal.  Vital Signs: BP 125/38  mmHg  Pulse 51  Temp(Src) 98.9 F (37.2 C) (Oral)  Resp 18  Ht 5' 10"  (1.778 m)  Wt 79.4 kg (175 lb 0.7 oz)  BMI 25.12 kg/m2  SpO2 97%  SpO2: SpO2: 97 % O2 Device:SpO2: 97 % O2 Flow Rate: .O2 Flow Rate (L/min): 4 L/min  IO: Intake/output summary:  Intake/Output Summary (Last 24 hours) at 09/22/15 2155 Last data filed at 09/22/15 1853  Gross per 24 hour  Intake    470 ml  Output   3575 ml  Net  -3105 ml    LBM: Last BM Date: 09/18/15 Baseline Weight: Weight: 95.709 kg (211 lb) Most recent weight: Weight: 79.4 kg (175 lb 0.7 oz)      Palliative Assessment/Data:  Flowsheet Rows        Most Recent Value   Intake Tab    Referral Department  Hospitalist   Unit at Time of Referral  Med/Surg Unit   Palliative Care Primary Diagnosis  Cardiac   Date Notified  09/21/15   Palliative Care Type  New Palliative care   Reason for referral  Clarify Goals of Care   Date of Admission  09/09/15   Date first seen by Palliative Care  09/22/15   # of days Palliative referral response time  1 Day(s)   # of days IP prior to Palliative referral  12   Clinical Assessment    Palliative Performance Scale Score  40%   Pain Max last 24 hours  Not able to report   Pain Min Last 24 hours  Not able to report   Psychosocial & Spiritual Assessment    Palliative Care Outcomes    Patient/Family meeting held?  Yes   Who was at the meeting?  Patient's son   Palliative Care Outcomes  ACP counseling assistance      Additional Data Reviewed:  CBC:    Component Value Date/Time   WBC 9.5 09/22/2015 0930   HGB 10.0* 09/22/2015 0930   HCT 31.8* 09/22/2015 0930   PLT 139* 09/22/2015 0930   MCV 89.3 09/22/2015 0930   NEUTROABS 5.5 09/09/2015 1542   LYMPHSABS 0.3* 09/09/2015 1542   MONOABS 0.3 09/09/2015 1542   EOSABS 0.0 09/09/2015 1542   BASOSABS 0.0 09/09/2015 1542   Comprehensive Metabolic Panel:    Component Value Date/Time   NA 136 09/22/2015 0254   K 4.1 09/22/2015 0254   CL 98*  09/22/2015 0254   CO2 25 09/22/2015 0254   BUN 72* 09/22/2015 0254   CREATININE 5.30* 09/22/2015 0254   GLUCOSE 117* 09/22/2015 0254   CALCIUM 8.3* 09/22/2015 0254   AST 66* 09/21/2015 0951   ALT 69* 09/21/2015 0951   ALKPHOS 103 09/21/2015 0951   BILITOT 1.5* 09/21/2015 0951   PROT 6.7 09/21/2015 0951   ALBUMIN 2.6* 09/21/2015 0951     Time In: 1240 Time Out: 1400 Time Total: 80 Greater than 50%  of this time was spent counseling and coordinating care related to the above assessment and plan.  Signed by: Micheline Rough, MD  Micheline Rough, MD  09/22/2015, 9:55 PM  Please contact Palliative Medicine Team phone at [phone] for questions and concerns.

## 2015-09-22 NOTE — Progress Notes (Signed)
Report given to Jannifer Franklin ,HD RN

## 2015-09-22 NOTE — Consult Note (Signed)
   [facility] CM Inpatient Consult   09/22/2015  [name] July 11, 1933 [id number] Patient has been screened for [facility] and discussed in progression meeting today.  Current discharge plan is for skilled facility post hospital.  Patient is eligible through his Medicare insurance plan. Please consult [place] [facility] if patient needs changes for community.  For questions, please contact: Natividad Brood, RN BSN [facility] Liaison  [phone] business mobile phone Toll free office [phone]

## 2015-09-22 NOTE — Progress Notes (Signed)
Patient ID: [name], male   DOB: Aug 02, 1933, 82 y.o.   MRN: [id number]    Advanced Heart Failure Rounding Note   Subjective:    Started HD 3/25. Seems to be tolerating well. Remains on high dose lasix. 09/19/15 he was transferred to ICU due to acute respiratory distress. Placed on Bipap. Weaned to NRB.   Remains stable on 02 via [place].   Up most of the night coughing per wife.  Some scant hemoptysis first thing this am that has since cleared up.   Going for dialysis today. Wife would like to know possible timing of discharge.   INR 3.94, No coumadin since 09/16/15.  09/10/2015: EF 60-65% MV severely calcified. Grade II DD . Moderate AS    Objective:   Weight Range:  Vital Signs:   Temp:  [97.6 F (36.4 C)-98.3 F (36.8 C)] 97.9 F (36.6 C) (03/29 0524) Pulse Rate:  [50-57] 54 (03/29 0524) Resp:  [19-35] 20 (03/29 0524) BP: (114-135)/(46-64) 125/64 mmHg (03/29 0524) SpO2:  [94 %-99 %] 96 % (03/29 0524) Weight:  [182 lb 14.4 oz (82.963 kg)] 182 lb 14.4 oz (82.963 kg) (03/29 0524) Last BM Date: 09/18/15  Weight change: Filed Weights   09/20/15 1457 09/21/15 0500 09/22/15 0524  Weight: 174 lb 13.2 oz (79.3 kg) 181 lb (82.1 kg) 182 lb 14.4 oz (82.963 kg)    Intake/Output:   Intake/Output Summary (Last 24 hours) at 09/22/15 0828 Last data filed at 09/22/15 0525  Gross per 24 hour  Intake    640 ml  Output     75 ml  Net    565 ml     Physical Exam: General:  Chronically ill appearing and Elderly appearing. NAD. Inb ed.  HEENT: normal Neck: supple. JVP 8-9 cm.  Carotids 2+ bilat; no bruits. No thyromegaly or nodule noted.  Cor: PMI nondisplaced. Regular rate & rhythm. No rubs, gallops. 3/6 HSM RUSB.  Lungs: RML RLL LLL crackles on 60%  oxygen.  Abdomen: soft, NT, ND, no HSM. No bruits or masses. +BS  Extremities: no cyanosis, clubbing, rash.  R and LLE SCDs. R and LLE trace edema. LUE AVF  Neuro: alert & orientedx3, cranial nerves grossly intact. moves all 4  extremities w/o difficulty. Affect pleasant GU: Foley.   Telemetry: Reviewed,  Sinus Brady 50s   Labs: Basic Metabolic Panel:  Recent Labs Lab 09/17/15 0540 09/18/15 0337 09/19/15 0341 09/20/15 0314 09/21/15 0951 09/22/15 0254  NA 136 135 137 139 137 136  K 3.8 4.0 4.9 4.6 4.4 4.1  CL 95* 93* 97* 100* 97* 98*  CO2 30 29 28 25 27 25   GLUCOSE 207* 165* 139* 105* 148* 117*  BUN 93* 103* 79* 56* 57* 72*  CREATININE 3.19* 3.54* 3.68* 3.41* 4.34* 5.30*  CALCIUM 8.8* 8.8* 8.8* 8.4* 8.4* 8.3*  MG 2.2  --   --   --   --   --   PHOS 5.3*  --   --   --  6.0*  --     Liver Function Tests:  Recent Labs Lab 09/17/15 0540 09/21/15 0951  AST  --  66*  ALT  --  69*  ALKPHOS  --  103  BILITOT  --  1.5*  PROT  --  6.7  ALBUMIN 2.9* 2.6*   No results for input(s): LIPASE, AMYLASE in the last 168 hours. No results for input(s): AMMONIA in the last 168 hours.  CBC:  Recent Labs Lab 09/18/15 [phone] 09/21/15 [phone]  WBC 16.3* 12.6*  HGB 11.0* 10.8*  HCT 35.8* 36.0*  MCV 89.5 91.6  PLT 136* 150    Cardiac Enzymes: No results for input(s): CKTOTAL, CKMB, CKMBINDEX, TROPONINI in the last 168 hours.  BNP: BNP (last 3 results)  Recent Labs  09/09/15 1326 09/09/15 1542  BNP 605.6* 729.6*    ProBNP (last 3 results) No results for input(s): PROBNP in the last 8760 hours.    Other results:  Imaging: No results found.   Medications:     Scheduled Medications: . antiseptic oral rinse  7 mL Mouth Rinse q12n4p  . atorvastatin  80 mg Oral q1800  . benzonatate  100 mg Oral BID  . chlorhexidine  15 mL Mouth Rinse BID  . cyanocobalamin  1,000 mcg Intramuscular Once  . feeding supplement (NEPRO CARB STEADY)  237 mL Oral TID BM  . furosemide  160 mg Oral TID  . insulin aspart  0-15 Units Subcutaneous TID WC  . insulin aspart  0-5 Units Subcutaneous QHS  . insulin glargine  15 Units Subcutaneous Daily  . isosorbide mononitrate  60 mg Oral Daily  . levothyroxine  175  mcg Oral QAC breakfast  . memantine  5 mg Oral Daily  . multivitamin  1 tablet Oral QHS  . pantoprazole  40 mg Oral Q0600  . piperacillin-tazobactam (ZOSYN)  IV  2.25 g Intravenous 3 times per day  . sodium chloride flush  3 mL Intravenous Q12H  . Warfarin - Pharmacist Dosing Inpatient   Does not apply q1800    Infusions:    PRN Medications: sodium chloride, sodium chloride, sodium chloride, sodium chloride, sodium chloride, acetaminophen, albuterol, alteplase, guaifenesin, heparin, lidocaine (PF), lidocaine-prilocaine, ondansetron (ZOFRAN) IV, pentafluoroprop-tetrafluoroeth, sodium chloride flush   Assessment/ Plan /Discussion.    1. Acute on chronic diastolic CHF: EF 123456, moderate diastolic dysfunction.Remains with marked volume overload still.  -Started on HD 3/25. . 2. CKD: Stage IV - Started HD 3/25.  - Creatinine 5.3. HD today.  3. Atrial fibrillation: Paroxysmal, in NSR. No beta blocker with bradycardia. - Warfarin on hold with supra-therapeutic INR. Pharmacy dosing.   Last dose 09/16/15. 4. CAD: s/p CABG, stable. Continue statin.  5. AS: Moderate on echo.  6. Mitral stenosis: Mild on echo.  7. Hypothyroidism:TSH 1.5 . Continue 175 mcg synthroid. 8. HTN: BP stable off antihypertensives now that he is getting HD.  9. Anemia: Iron stores low. Got Fe this admission.  10. Dementia 11. Deconditioning: PT following. Recommending SNF. SW following for SNF 12.  Poor PO intake: Dietician input appreciated.   13. UTI: Klebsiella and E coli in urine, both sensitive to Cipro.  Complete 7 days Cipro and can stop Zosyn.   14. Acute/Chronic Respiratory Failure - Transferred to ICU on NRB on 3./26.  - Now stable on [place] via 02 and back in telemetry floor.  15. Disposition - To SNF when stable per renal.  Palliative to see as well.   Will need to discuss with renal possible disposition. His fluid will need to be managed by dialysis with his continued poor response to IV lasix.    Shirley Friar PA-C 09/22/2015 8:28 AM   Advanced Heart Failure Team Pager [phone] (M-F; 7a - 4p)  Please contact [facility] for night-coverage after hours (4p -7a ) and weekends on [email or web address]  Patient seen with PA, agree with the above note. Continue HD for fluid removal, volume status improving.    UTI with Klebsiella and E coli both sensitive to  Cipro, will stop Zosyn and complete 7 days abx with Cipro.   To SNF when he is tolerating HD and has outpatient HD slot available, hopefully in next day or 2.   Loralie Champagne 09/22/2015 10:29 AM

## 2015-09-22 NOTE — Progress Notes (Signed)
Nutrition Follow-up  DOCUMENTATION CODES:   Not applicable  INTERVENTION:  -Order dysphagia 2 diet, soft chopped meats -Continue with Nepro TID, 425 kcal, and 19.1 grams of protein, 172 ml of water per bottle.    NUTRITION DIAGNOSIS:   Inadequate oral intake related to poor appetite as evidenced by per patient/family report, meal completion < 50%.  -Ongoing  GOAL:   Patient will meet greater than or equal to 90% of their needs  -Progressing   MONITOR:   PO intake, Supplement acceptance, Weight trends, Labs, I & O's, Skin  ASSESSMENT:   82 y.o. male with history of T2DM who presents with complaint of 2-3 week history of lower extremity swelling, cough, and dyspnea.  3/25-pt started HD, stage IV CKD.  Pt asleep during visit, spoke with family member. Family reports the pt is eating better but intake varies. Per chart pt is consuming 0%-75%. Pt's family requested his meats be chopped and soft, will order dysphagia 2 diet. Family reports pt drinks the Nepro, pt prefers this over [place]. Will continue Nepro TID.   Medications reviewed; multivitamin (Rena-vit) 1 tablet daily. Labs reviewed.   Diet Order:  Diet renal/carb modified with fluid restriction Diet-HS Snack?: Nothing; Room service appropriate?: Yes; Fluid consistency:: Thin  Skin:  Wound (see comment) (Stage I Pressure Injury on Sacrum)  Last BM:  09/18/2015  Height:   Ht Readings from Last 1 Encounters:  09/09/15 5\' 10"  (1.778 m)    Weight:   Wt Readings from Last 1 Encounters:  09/22/15 175 lb 0.7 oz (79.4 kg)    Ideal Body Weight:  75.5 kg  BMI:  Body mass index is 25.12 kg/(m^2).  Estimated Nutritional Needs:   Kcal:  1800-2000  Protein:  100-110 grams (1.2 g/kg)   Fluid:  1.8 L/day  EDUCATION NEEDS:   No education needs identified at this time  [place], Dietetic Intern Pager: [phone]

## 2015-09-22 NOTE — Progress Notes (Signed)
Dialysis treatment completed.  4000 mL ultrafiltrated and net fluid removal 3500 mL.    Patient status unchanged. Lung sounds diminished to ausculation in all fields. Generalized edema. Cardiac: Bradycardic.  Disconnected lines and removed needles.  Pressure held for 10 minutes and band aid/gauze dressing applied.  Report given to bedside RN, Deneise Lever.

## 2015-09-22 NOTE — Progress Notes (Signed)
ANTICOAGULATION Consult Note - Follow up consult  Pharmacy Consult for Coumadin  Indication: atrial fibrillation  Allergies  Allergen Reactions  . Betadine [Povidone Iodine] Rash  . Iodinated Diagnostic Agents Rash and Other (See Comments)    Does fine with premedications for cath  . Latex Hives    Patient Measurements: Height: 5\' 10"  (177.8 cm) Weight: 182 lb 14.4 oz (82.963 kg) (bed scale) IBW/kg (Calculated) : 73  Vital Signs: Temp: 97.9 F (36.6 C) (03/29 0524) Temp Source: Oral (03/29 0524) BP: 125/64 mmHg (03/29 0524) Pulse Rate: 54 (03/29 0524)  Labs:  Recent Labs  09/20/15 0314 09/21/15 0951 09/22/15 0254  HGB  --  10.8*  --   HCT  --  36.0*  --   PLT  --  150  --   LABPROT 44.6* 36.4* 37.6*  INR 4.96* 3.77* 3.94*  CREATININE 3.41* 4.34* 5.30*    Estimated Creatinine Clearance: 11.7 mL/min (by C-G formula based on Cr of 5.3).   Assessment: [name] admitted 09/09/2015 with SOB. Patient on coumadin PTA for AFib. Pharmacy consulted to continue dosing inpatient.   INR still elevated at 3.94 despite no coumadin since 3/23. LFTs only mildly elevated.  On amiodarone PTA which has been stopped due to bradycardia.    PTA coumadin dose = 2.5mg  Mon/Tue/Wed/Sat and 5mg  Sun/Thurs  Goal of Therapy:  INR 2-3 Monitor platelets by anticoagulation protocol: Yes   Plan:  1) No coumadin tonight 2) Daily INR   Melburn Popper, PharmD Clinical Pharmacy Resident Pager: [phone] 09/22/2015 8:59 AM

## 2015-09-23 DIAGNOSIS — Z992 Dependence on renal dialysis: Secondary | ICD-10-CM

## 2015-09-23 DIAGNOSIS — Z515 Encounter for palliative care: Secondary | ICD-10-CM | POA: Insufficient documentation

## 2015-09-23 DIAGNOSIS — Z7189 Other specified counseling: Secondary | ICD-10-CM | POA: Insufficient documentation

## 2015-09-23 DIAGNOSIS — N186 End stage renal disease: Secondary | ICD-10-CM | POA: Insufficient documentation

## 2015-09-23 LAB — PROTIME-INR
INR: 3.48 — ABNORMAL HIGH (ref 0.00–1.49)
Prothrombin Time: 34.2 seconds — ABNORMAL HIGH (ref 11.6–15.2)

## 2015-09-23 LAB — PTH, INTACT AND CALCIUM
CALCIUM TOTAL (PTH): 8.1 mg/dL — AB (ref 8.6–10.2)
PTH: 28 pg/mL (ref 15–65)

## 2015-09-23 LAB — BASIC METABOLIC PANEL
Anion gap: 10 (ref 5–15)
BUN: 39 mg/dL — AB (ref 6–20)
CHLORIDE: 98 mmol/L — AB (ref 101–111)
CO2: 28 mmol/L (ref 22–32)
Calcium: 8.3 mg/dL — ABNORMAL LOW (ref 8.9–10.3)
Creatinine, Ser: 4.17 mg/dL — ABNORMAL HIGH (ref 0.61–1.24)
GFR calc Af Amer: 14 mL/min — ABNORMAL LOW (ref >60)
GFR calc non Af Amer: 12 mL/min — ABNORMAL LOW (ref >60)
GLUCOSE: 137 mg/dL — AB (ref 65–99)
POTASSIUM: 4 mmol/L (ref 3.5–5.1)
Sodium: 136 mmol/L (ref 135–145)

## 2015-09-23 LAB — GLUCOSE, CAPILLARY
GLUCOSE-CAPILLARY: 153 mg/dL — AB (ref 65–99)
GLUCOSE-CAPILLARY: 140 mg/dL — AB (ref 65–99)
GLUCOSE-CAPILLARY: 165 mg/dL — AB (ref 65–99)
Glucose-Capillary: 126 mg/dL — ABNORMAL HIGH (ref 65–99)

## 2015-09-23 NOTE — Progress Notes (Signed)
Patient ID: [name], male   DOB: [birth year], 82 y.o.   MRN: [id number] Discussed with family.  He is tolerating dialysis well but that is only part of picture, ie age, heart, dementia, severe debill.  Dialysis cannot solve those issues.  They understand and if not improved in 1 wk , wish to stop dialysis.

## 2015-09-23 NOTE — Progress Notes (Signed)
Patient ID: [name], male   DOB: 1933/12/23, 82 y.o.   MRN: [id number]    Advanced Heart Failure Rounding Note   Subjective:    Started HD 3/25. Seems to be tolerating well. Remains on high dose lasix. 09/19/15 he was transferred to ICU due to acute respiratory distress. Placed on Bipap. Weaned to NRB.   Remains stable on 02 via [place].  Had  HD  yesterday.   Feeling OK this morning. Family still unsure if he is going to tolerate dialysis. To fill out MOST form today.  Slept well last night and breathing is stable this am.   INR 3.48, No coumadin since 09/16/15.  09/10/2015: EF 60-65% MV severely calcified. Grade II DD . Moderate AS    Objective:   Weight Range:  Vital Signs:   Temp:  [97.8 F (36.6 C)-98.9 F (37.2 C)] 98 F (36.7 C) (03/30 0636) Pulse Rate:  [50-87] 53 (03/30 0636) Resp:  [18-34] 18 (03/30 0636) BP: (108-141)/(38-95) 141/42 mmHg (03/30 0636) SpO2:  [97 %-98 %] 98 % (03/30 0636) Weight:  [175 lb 0.7 oz (79.4 kg)-182 lb 12.2 oz (82.9 kg)] 175 lb 0.7 oz (79.4 kg) (03/29 1343) Last BM Date: 09/21/15  Weight change: Filed Weights   09/22/15 0524 09/22/15 0932 09/22/15 1343  Weight: 182 lb 14.4 oz (82.963 kg) 182 lb 12.2 oz (82.9 kg) 175 lb 0.7 oz (79.4 kg)    Intake/Output:   Intake/Output Summary (Last 24 hours) at 09/23/15 0752 Last data filed at 09/23/15 0200  Gross per 24 hour  Intake    470 ml  Output   3525 ml  Net  -3055 ml     Physical Exam: General:  Chronically ill appearing and Elderly appearing. NAD. In bed.  HEENT: normal, Bandana in place Neck: supple. JVP 7-8 cm.  Carotids 2+ bilat; no bruits. No thyromegaly or nodule noted.  Cor: PMI nondisplaced. RRR. No rubs, gallops. 3/6 HSM RUSB.  Lungs: Slight basilar crackles, on 02 via . Abdomen: soft, NT, ND, no HSM. No bruits or masses. +BS  Extremities: no cyanosis, clubbing, rash.  R and LLE SCDs. R and LLE trace ankle edema. LUE AVF  Neuro: alert & orientedx3, cranial nerves grossly  intact. moves all 4 extremities w/o difficulty. Affect pleasant GU: Foley.   Telemetry: Reviewed, Sinus Brady 50s   Labs: Basic Metabolic Panel:  Recent Labs Lab 09/17/15 0540  09/19/15 0341 09/20/15 0314 09/21/15 0951 09/22/15 0254 09/22/15 0930 09/23/15 0533  NA 136  < > 137 139 137 136  --  136  K 3.8  < > 4.9 4.6 4.4 4.1  --  4.0  CL 95*  < > 97* 100* 97* 98*  --  98*  CO2 30  < > 28 25 27 25   --  28  GLUCOSE 207*  < > 139* 105* 148* 117*  --  137*  BUN 93*  < > 79* 56* 57* 72*  --  39*  CREATININE 3.19*  < > 3.68* 3.41* 4.34* 5.30*  --  4.17*  CALCIUM 8.8*  < > 8.8* 8.4* 8.4* 8.3* 8.1* 8.3*  MG 2.2  --   --   --   --   --   --   --   PHOS 5.3*  --   --   --  6.0*  --   --   --   < > = values in this interval not displayed.  Liver Function Tests:  Recent Labs Lab  09/17/15 0540 09/21/15 0951  AST  --  66*  ALT  --  69*  ALKPHOS  --  103  BILITOT  --  1.5*  PROT  --  6.7  ALBUMIN 2.9* 2.6*   No results for input(s): LIPASE, AMYLASE in the last 168 hours. No results for input(s): AMMONIA in the last 168 hours.  CBC:  Recent Labs Lab 09/18/15 0337 09/21/15 0951 09/22/15 0930  WBC 16.3* 12.6* 9.5  HGB 11.0* 10.8* 10.0*  HCT 35.8* 36.0* 31.8*  MCV 89.5 91.6 89.3  PLT 136* 150 139*    Cardiac Enzymes: No results for input(s): CKTOTAL, CKMB, CKMBINDEX, TROPONINI in the last 168 hours.  BNP: BNP (last 3 results)  Recent Labs  09/09/15 1326 09/09/15 1542  BNP 605.6* 729.6*    ProBNP (last 3 results) No results for input(s): PROBNP in the last 8760 hours.    Other results:  Imaging: No results found.   Medications:     Scheduled Medications: . antiseptic oral rinse  7 mL Mouth Rinse q12n4p  . atorvastatin  80 mg Oral q1800  . benzonatate  100 mg Oral BID  . chlorhexidine  15 mL Mouth Rinse BID  . cyanocobalamin  1,000 mcg Intramuscular Once  . darbepoetin (ARANESP) injection - DIALYSIS  60 mcg Intravenous Q Wed-HD  . feeding  supplement (NEPRO CARB STEADY)  237 mL Oral TID BM  . insulin aspart  0-15 Units Subcutaneous TID WC  . insulin aspart  0-5 Units Subcutaneous QHS  . insulin glargine  15 Units Subcutaneous Daily  . isosorbide mononitrate  60 mg Oral QHS  . levothyroxine  175 mcg Oral QAC breakfast  . memantine  5 mg Oral Daily  . multivitamin  1 tablet Oral QHS  . pantoprazole  40 mg Oral Q0600  . sodium chloride flush  3 mL Intravenous Q12H  . sulfamethoxazole-trimethoprim  1 tablet Oral Daily  . Warfarin - Pharmacist Dosing Inpatient   Does not apply q1800    Infusions:    PRN Medications: sodium chloride, sodium chloride, sodium chloride, sodium chloride, sodium chloride, acetaminophen, albuterol, alteplase, guaifenesin, heparin, lidocaine (PF), lidocaine-prilocaine, ondansetron (ZOFRAN) IV, pentafluoroprop-tetrafluoroeth, sodium chloride flush   Assessment/ Plan /Discussion.    1. Acute on chronic diastolic CHF: EF [id number], moderate diastolic dysfunction.Remains with marked volume overload still.  -Started on HD 3/25. . 2. CKD: Stage IV - Started HD 3/25.  - HD yesterday with 4 L out.  3. Atrial fibrillation: Paroxysmal, in NSR. No beta blocker with bradycardia. - Warfarin on hold with supra-therapeutic INR. Pharmacy dosing.   Last dose 09/16/15. 4. CAD: s/p CABG, stable. Continue statin.  5. AS: Moderate on echo.  6. Mitral stenosis: Mild on echo.  7. Hypothyroidism: TSH 1.5 . Continue 175 mcg synthroid. 8. HTN: BP stable off antihypertensives now that he is getting HD.  9. Anemia: Iron stores low. Got Fe this admission.  10. Dementia 11. Deconditioning: PT following. Recommending SNF. SW following for SNF 12.  Poor PO intake: Dietician input appreciated.   13. UTI: Klebsiella and E coli in urine, both sensitive to Cipro.  Complete 7 days Cipro and can stop Zosyn.   14. Acute/Chronic Respiratory Failure - Transferred to ICU on NRB on 3./26.  - Stable on Nasal Cannula.  15.  Disposition - To SNF when stable per renal.   - Palliative following. To complete MOST form this am with family.   To SNF when he is tolerating HD and has outpatient HD  arranged. Family to meet further with palliative for GOC today.  Shirley Friar PA-C 09/23/2015 7:52 AM   Advanced Heart Failure Team Pager [phone] (M-F; 7a - 4p)  Please contact [facility] for night-coverage after hours (4p -7a ) and weekends on [email or web address]  Patient seen with PA, agree with the above note.  He is stable today, got HD yesterday.  Discussion with palliative care => would try HD for 1-2 weeks, and if no improvement/strengthening, consider stop HD.  Would be good if he could be followed by palliative care service out of hospital.  I think he may be ready for SNF tomorrow after HD, will ask social work to arrange.   Loralie Champagne 09/23/2015 1:10 PM

## 2015-09-23 NOTE — Progress Notes (Signed)
Subjective: Interval History: has no complaint still weak.  Objective: Vital signs in last 24 hours: Temp:  [97.8 F (36.6 C)-98.9 F (37.2 C)] 98.6 F (37 C) (03/30 0806) Pulse Rate:  [50-87] 53 (03/30 0806) Resp:  [18-34] 18 (03/30 0806) BP: (108-141)/(38-95) 138/40 mmHg (03/30 0948) SpO2:  [96 %-98 %] 96 % (03/30 0806) Weight:  [79.4 kg (175 lb 0.7 oz)-85.775 kg (189 lb 1.6 oz)] 85.775 kg (189 lb 1.6 oz) (03/30 0845) Weight change: -0.063 kg (-2.2 oz)  Intake/Output from previous day: 03/29 0701 - 03/30 0700 In: 470 [P.O.:470] Out: 3525 [Urine:25] Intake/Output this shift:    General appearance: cooperative, no distress and slowed mentation Resp: diminished breath sounds RLL and rales RLL Cardio: S1, S2 normal and systolic murmur: holosystolic 2/6, crescendo at 2nd left intercostal space GI: soft, non-tender; bowel sounds normal; no masses,  no organomegaly Extremities: avf LUA B&T  Lab Results:  Recent Labs  09/21/15 0951 09/22/15 0930  WBC 12.6* 9.5  HGB 10.8* 10.0*  HCT 36.0* 31.8*  PLT 150 139*   BMET:  Recent Labs  09/22/15 0254 09/22/15 0930 09/23/15 0533  NA 136  --  136  K 4.1  --  4.0  CL 98*  --  98*  CO2 25  --  28  GLUCOSE 117*  --  137*  BUN 72*  --  39*  CREATININE 5.30*  --  4.17*  CALCIUM 8.3* 8.1* 8.3*    Recent Labs  09/22/15 0930  PTH 28  Comment   Iron Studies: No results for input(s): IRON, TIBC, TRANSFERRIN, FERRITIN in the last 72 hours.  Studies/Results: No results found.  I have reviewed the patient's current medications.  Assessment/Plan: 1 ESRD for hd in am 2 Debill primary issue needs PT 3 Anemia esa 4 HPTH vit D 5 Afib 6 DM 7 Dementia. P Hd, PT, esa, vit D, mobilize    LOS: 14 days   Adalae Baysinger L 09/23/2015,10:02 AM

## 2015-09-23 NOTE — Progress Notes (Signed)
ANTICOAGULATION Consult Note - Follow up consult  Pharmacy Consult for Coumadin  Indication: atrial fibrillation  Allergies  Allergen Reactions  . Betadine [Povidone Iodine] Rash  . Iodinated Diagnostic Agents Rash and Other (See Comments)    Does fine with premedications for cath  . Latex Hives    Patient Measurements: Height: 5\' 10"  (177.8 cm) Weight: 175 lb 0.7 oz (79.4 kg) IBW/kg (Calculated) : 73  Vital Signs: Temp: 98 F (36.7 C) (03/30 0636) Temp Source: Oral (03/30 0636) BP: 141/42 mmHg (03/30 0636) Pulse Rate: 53 (03/30 0636)  Labs:  Recent Labs  09/21/15 0951 09/22/15 0254 09/22/15 0930 09/23/15 0335 09/23/15 0533  HGB 10.8*  --  10.0*  --   --   HCT 36.0*  --  31.8*  --   --   PLT 150  --  139*  --   --   LABPROT 36.4* 37.6*  --  34.2*  --   INR 3.77* 3.94*  --  3.48*  --   CREATININE 4.34* 5.30*  --   --  4.17*    Estimated Creatinine Clearance: 14.8 mL/min (by C-G formula based on Cr of 4.17).   Assessment: 52 yom admitted 09/09/2015 with SOB. Patient on coumadin PTA for AFib. Pharmacy consulted to continue dosing inpatient.   INR still elevated at 3.48 despite no coumadin since 3/23. LFTs only mildly elevated. Patient is now on bactrim that may also impact INR. On amiodarone PTA which has been stopped due to bradycardia.  PTA coumadin dose = 2.5mg  Mon/Tue/Wed/Sat and 5mg  Sun/Thurs  Goal of Therapy:  INR 2-3 Monitor platelets by anticoagulation protocol: Yes   Plan:  1) No coumadin tonight 2) Daily INR   Melburn Popper, PharmD Clinical Pharmacy Resident Pager: [phone] 09/23/2015 8:04 AM

## 2015-09-23 NOTE — Progress Notes (Signed)
Daily Progress Note   Patient Name: [name]       Date: 09/23/2015 DOB: Jun 14, 1934  Age: 82 y.o. MRN#: [id number] Attending Physician: Belva Crome, MD Primary Care Physician: Donnajean Lopes, MD Admit Date: 09/09/2015  Reason for Consultation/Follow-up: Establishing goals of care  Subjective: I met with the patient, his wife, and his son/HCPOA, Aaron Edelman, today.    We reviewed a MOST form and discussed how to develop plan of care to focus on continuing therapies that would maximize chance of being well enough to spend meaningful time with family and limiting therapies not in line with this goal.  We discussed heroic interventions at the end-of-life. There is agreement this would not be in line with prior expressed wishes for a natural death or be likely to lead to getting well enough to spend meaningful time with family. Family in agreement with changing CODE STATUS to DO NOT RESUSCITATE.  We completed MOST form today. DNR, Limited additional interventions, IVF and ABX if indicated, no feeding tube.  Length of Stay: 14 days  Current Medications: Scheduled Meds:  . antiseptic oral rinse  7 mL Mouth Rinse q12n4p  . atorvastatin  80 mg Oral q1800  . benzonatate  100 mg Oral BID  . chlorhexidine  15 mL Mouth Rinse BID  . cyanocobalamin  1,000 mcg Intramuscular Once  . darbepoetin (ARANESP) injection - DIALYSIS  60 mcg Intravenous Q Wed-HD  . feeding supplement (NEPRO CARB STEADY)  237 mL Oral TID BM  . insulin aspart  0-15 Units Subcutaneous TID WC  . insulin aspart  0-5 Units Subcutaneous QHS  . insulin glargine  15 Units Subcutaneous Daily  . isosorbide mononitrate  60 mg Oral QHS  . levothyroxine  175 mcg Oral QAC breakfast  . memantine  5 mg Oral Daily  . multivitamin   1 tablet Oral QHS  . pantoprazole  40 mg Oral Q0600  . sodium chloride flush  3 mL Intravenous Q12H  . sulfamethoxazole-trimethoprim  1 tablet Oral Daily  . Warfarin - Pharmacist Dosing Inpatient   Does not apply q1800    Continuous Infusions:    PRN Meds: sodium chloride, sodium chloride, sodium chloride, sodium chloride, sodium chloride, acetaminophen, albuterol, alteplase, guaifenesin, heparin, lidocaine (PF), lidocaine-prilocaine, ondansetron (ZOFRAN) IV, pentafluoroprop-tetrafluoroeth, sodium chloride flush  Physical Exam: Physical  Exam             General: Alert, awake, in no acute distress. Chronically, ill appearing male. Confused. HEENT: + JVD Heart: Regular rate and rhythm. SEM noted. Lungs: Good air movement, + crackles In bases Abdomen: Soft, nontender, nondistended, positive bowel sounds.  Ext: + edema Skin: Warm and dry Neuro: Grossly intact, nonfocal.  Vital Signs: BP 128/42 mmHg  Pulse 51  Temp(Src) 98.3 F (36.8 C) (Oral)  Resp 18  Ht 5' 10"  (1.778 m)  Wt 85.775 kg (189 lb 1.6 oz)  BMI 27.13 kg/m2  SpO2 98% SpO2: SpO2: 98 % O2 Device: O2 Device: Nasal Cannula O2 Flow Rate: O2 Flow Rate (L/min): 4 L/min  Intake/output summary:  Intake/Output Summary (Last 24 hours) at 09/23/15 1423 Last data filed at 09/23/15 1154  Gross per 24 hour  Intake    473 ml  Output     26 ml  Net    447 ml   LBM: Last BM Date: 09/21/15 Baseline Weight: Weight: 95.709 kg (211 lb) Most recent weight: Weight: 85.775 kg (189 lb 1.6 oz)       Palliative Assessment/Data: Flowsheet Rows        Most Recent Value   Intake Tab    Referral Department  Hospitalist   Unit at Time of Referral  Med/Surg Unit   Palliative Care Primary Diagnosis  Cardiac   Date Notified  09/21/15   Palliative Care Type  New Palliative care   Reason for referral  Clarify Goals of Care   Date of Admission  09/09/15   Date first seen by Palliative Care  09/22/15   # of days Palliative referral  response time  1 Day(s)   # of days IP prior to Palliative referral  12   Clinical Assessment    Palliative Performance Scale Score  40%   Pain Max last 24 hours  Not able to report   Pain Min Last 24 hours  Not able to report   Psychosocial & Spiritual Assessment    Palliative Care Outcomes    Patient/Family meeting held?  Yes   Who was at the meeting?  Patient's son   Palliative Care Outcomes  ACP counseling assistance      Additional Data Reviewed: CBC    Component Value Date/Time   WBC 9.5 09/22/2015 0930   RBC 3.56* 09/22/2015 0930   HGB 10.0* 09/22/2015 0930   HCT 31.8* 09/22/2015 0930   PLT 139* 09/22/2015 0930   MCV 89.3 09/22/2015 0930   MCH 28.1 09/22/2015 0930   MCHC 31.4 09/22/2015 0930   RDW 16.3* 09/22/2015 0930   LYMPHSABS 0.3* 09/09/2015 1542   MONOABS 0.3 09/09/2015 1542   EOSABS 0.0 09/09/2015 1542   BASOSABS 0.0 09/09/2015 1542    CMP     Component Value Date/Time   NA 136 09/23/2015 0533   K 4.0 09/23/2015 0533   CL 98* 09/23/2015 0533   CO2 28 09/23/2015 0533   GLUCOSE 137* 09/23/2015 0533   BUN 39* 09/23/2015 0533   CREATININE 4.17* 09/23/2015 0533   CALCIUM 8.3* 09/23/2015 0533   CALCIUM 8.1* 09/22/2015 0930   PROT 6.7 09/21/2015 0951   ALBUMIN 2.6* 09/21/2015 0951   AST 66* 09/21/2015 0951   ALT 69* 09/21/2015 0951   ALKPHOS 103 09/21/2015 0951   BILITOT 1.5* 09/21/2015 0951   GFRNONAA 12* 09/23/2015 0533   GFRAA 14* 09/23/2015 0533       Problem List:  Patient Active Problem  List   Diagnosis Date Noted  . Acute on chronic diastolic heart failure ([place]) 09/09/2015  . Decreased pedal pulses 06/03/2015  . Fall 03/24/2014  . Aftercare following surgery of the circulatory system, [place] 03/18/2014  . Chronic combined systolic and diastolic CHF (congestive heart failure) ([place]) 03/18/2014  . Hypertensive heart disease with heart failure ([place]) 03/18/2014  . CKD (chronic kidney disease), stage IV ([place]) 03/18/2014  . Hypothyroidism  03/18/2014  . Encounter for therapeutic drug monitoring 02/19/2014  . Atrial fibrillation ([place]) 02/13/2014  . Peripheral vascular disease, unspecified ([place]) 11/12/2013  . Occlusion and stenosis of carotid artery without mention of cerebral infarction 03/06/2012  . DOE (dyspnea on exertion) 02/13/2012  . Hyperlipidemia 08/30/2011  . CAD (coronary artery disease) 06/19/2007  . DM w/o Complication Type II 30/12/6224     Palliative Care Assessment & Plan    1.Code Status:  DNR    Code Status Orders        Start     Ordered   09/23/15 1327  Do not attempt resuscitation (DNR)   Continuous    Question Answer Comment  In the event of cardiac or respiratory ARREST Do not call a "code blue"   In the event of cardiac or respiratory ARREST Do not perform Intubation, CPR, defibrillation or ACLS   In the event of cardiac or respiratory ARREST Use medication by any route, position, wound care, and other measures to relive pain and suffering. May use oxygen, suction and manual treatment of airway obstruction as needed for comfort.      09/23/15 1326    Code Status History    Date Active Date Inactive Code Status Order ID Comments User Context   09/09/2015  3:29 PM 09/23/2015  1:26 PM Full Code [id number]  Belva Crome, MD ED   03/24/2014 12:41 AM 03/24/2014  6:34 PM Full Code [id number]  Shanda Howells, MD ED   03/04/2014  4:32 PM 03/07/2014  3:54 PM Full Code [id number]  Lonn Georgia, PA-C ED    Advance Directive Documentation        Most Recent Value   Type of Advance Directive  Healthcare Power of Attorney   Pre-existing out of facility DNR order (yellow form or pink MOST form)     "MOST" Form in Place?         2. Goals of Care/Additional Recommendations:  We completed MOST form today. DNR, Limited additional interventions, IVF and ABX if indicated, no feeding tube.  He has been tolerating dialysis well, but he remains frail with poor nutritional, cognitive, and functional status.   His family is in agreement with plan to continue trial of dialysis for another 1-2 weeks and reassess his overall condition.  If no improvement in his strength, nutrition, functional status, or cognition, consider stop HD at that time.  I recommended that if this becomes the case, he would benefit from referral to hospice with possible residential hospice placement for end of life care.  He would benefit greatly from palliative care f/u at SNF if this can be arranged.    3. Symptom Management:      1. Denies complaints today.  Continue same  4. Palliative Prophylaxis:   Aspiration, Bowel Regimen, Delirium Protocol and Frequent Pain Assessment  5. Prognosis: Unable to determine  6. Discharge Planning:  [facility] for rehab with Palliative care service follow-up   Care plan was discussed with patient, family, Dr. Aundra Dubin  Thank you for allowing the Palliative Medicine  Team to assist in the care of this patient.   Time In: 1300 Time Out: 1350 Total Time 50 Prolonged Time Billed No  Greater than 50% of this time was spent counseling and coordinating care related to the above assessment and plan.      Micheline Rough, MD  09/23/2015, 2:23 PM  Please contact Palliative Medicine Team phone at [phone] for questions and concerns.

## 2015-09-24 DIAGNOSIS — Z992 Dependence on renal dialysis: Secondary | ICD-10-CM

## 2015-09-24 LAB — BASIC METABOLIC PANEL
Anion gap: 12 (ref 5–15)
BUN: 57 mg/dL — AB (ref 6–20)
CALCIUM: 8.3 mg/dL — AB (ref 8.9–10.3)
CO2: 26 mmol/L (ref 22–32)
CREATININE: 5.85 mg/dL — AB (ref 0.61–1.24)
Chloride: 97 mmol/L — ABNORMAL LOW (ref 101–111)
GFR calc Af Amer: 10 mL/min — ABNORMAL LOW (ref >60)
GFR, EST NON AFRICAN AMERICAN: 8 mL/min — AB (ref >60)
Glucose, Bld: 155 mg/dL — ABNORMAL HIGH (ref 65–99)
Potassium: 4.1 mmol/L (ref 3.5–5.1)
SODIUM: 135 mmol/L (ref 135–145)

## 2015-09-24 LAB — PROTIME-INR
INR: 3.19 — ABNORMAL HIGH (ref 0.00–1.49)
PROTHROMBIN TIME: 32.1 s — AB (ref 11.6–15.2)

## 2015-09-24 LAB — CULTURE, RESPIRATORY W GRAM STAIN

## 2015-09-24 LAB — CBC
HCT: 32.8 % — ABNORMAL LOW (ref 39.0–52.0)
HCT: 33 % — ABNORMAL LOW (ref 39.0–52.0)
HEMOGLOBIN: 10.3 g/dL — AB (ref 13.0–17.0)
Hemoglobin: 10.2 g/dL — ABNORMAL LOW (ref 13.0–17.0)
MCH: 27.9 pg (ref 26.0–34.0)
MCH: 28.1 pg (ref 26.0–34.0)
MCHC: 31.1 g/dL (ref 30.0–36.0)
MCHC: 31.2 g/dL (ref 30.0–36.0)
MCV: 89.6 fL (ref 78.0–100.0)
MCV: 89.9 fL (ref 78.0–100.0)
PLATELETS: 140 10*3/uL — AB (ref 150–400)
PLATELETS: 129 10*3/uL — AB (ref 150–400)
RBC: 3.66 MIL/uL — AB (ref 4.22–5.81)
RBC: 3.67 MIL/uL — ABNORMAL LOW (ref 4.22–5.81)
RDW: 16.5 % — AB (ref 11.5–15.5)
RDW: 16.6 % — ABNORMAL HIGH (ref 11.5–15.5)
WBC: 7.2 10*3/uL (ref 4.0–10.5)
WBC: 7.5 10*3/uL (ref 4.0–10.5)

## 2015-09-24 LAB — RENAL FUNCTION PANEL
Albumin: 2.4 g/dL — ABNORMAL LOW (ref 3.5–5.0)
Anion gap: 13 (ref 5–15)
BUN: 58 mg/dL — ABNORMAL HIGH (ref 6–20)
CALCIUM: 8.4 mg/dL — AB (ref 8.9–10.3)
CO2: 23 mmol/L (ref 22–32)
CREATININE: 5.92 mg/dL — AB (ref 0.61–1.24)
Chloride: 98 mmol/L — ABNORMAL LOW (ref 101–111)
GFR calc Af Amer: 9 mL/min — ABNORMAL LOW (ref >60)
GFR calc non Af Amer: 8 mL/min — ABNORMAL LOW (ref >60)
GLUCOSE: 161 mg/dL — AB (ref 65–99)
Phosphorus: 6.4 mg/dL — ABNORMAL HIGH (ref 2.5–4.6)
Potassium: 4.1 mmol/L (ref 3.5–5.1)
SODIUM: 134 mmol/L — AB (ref 135–145)

## 2015-09-24 LAB — GLUCOSE, CAPILLARY
GLUCOSE-CAPILLARY: 140 mg/dL — AB (ref 65–99)
Glucose-Capillary: 266 mg/dL — ABNORMAL HIGH (ref 65–99)
Glucose-Capillary: 174 mg/dL — ABNORMAL HIGH (ref 65–99)

## 2015-09-24 LAB — CULTURE, BLOOD (ROUTINE X 2)
CULTURE: NO GROWTH
Culture: NO GROWTH

## 2015-09-24 LAB — CULTURE, RESPIRATORY

## 2015-09-24 MED ORDER — SODIUM CHLORIDE 0.9 % IV SOLN: 100.0000 mL | INTRAVENOUS | Status: DC | PRN

## 2015-09-24 MED ORDER — HEPARIN SODIUM (PORCINE) 1000 UNIT/ML DIALYSIS: 100.0000 [IU]/kg | INTRAMUSCULAR | Status: DC | PRN

## 2015-09-24 MED ORDER — PENTAFLUOROPROP-TETRAFLUOROETH EX AERO: 1.0000 "application " | INHALATION_SPRAY | CUTANEOUS | Status: DC | PRN

## 2015-09-24 MED ORDER — AMOXICILLIN 500 MG PO CAPS
500.0000 mg | ORAL_CAPSULE | Freq: Every day | ORAL | Status: DC
  Administered 2015-09-24 – 2015-09-28 (×5): 500 mg via ORAL
  Filled 2015-09-24 (×5): qty 1

## 2015-09-24 MED ORDER — LIDOCAINE-PRILOCAINE 2.5-2.5 % EX CREA: 1.0000 "application " | TOPICAL_CREAM | CUTANEOUS | Status: DC | PRN

## 2015-09-24 MED ORDER — ALTEPLASE 2 MG IJ SOLR: 2.0000 mg | Freq: Once | INTRAMUSCULAR | Status: DC | PRN

## 2015-09-24 MED ORDER — LIDOCAINE HCL (PF) 1 % IJ SOLN: 5.0000 mL | INTRAMUSCULAR | Status: DC | PRN

## 2015-09-24 MED ORDER — SEVELAMER CARBONATE 2.4 G PO PACK
2.4000 g | PACK | Freq: Three times a day (TID) | ORAL | Status: DC
  Administered 2015-09-24 – 2015-09-29 (×7): 2.4 g via ORAL
  Filled 2015-09-24 (×24): qty 1

## 2015-09-24 MED ORDER — HEPARIN SODIUM (PORCINE) 1000 UNIT/ML DIALYSIS: 1000.0000 [IU] | INTRAMUSCULAR | Status: DC | PRN

## 2015-09-24 NOTE — Procedures (Signed)
I was present at this session.  I have reviewed the session itself and made appropriate changes.  Hd via LUA avf, bp ok, access press ok.  Ekin Pilar L 3/31/20179:52 AM

## 2015-09-24 NOTE — Progress Notes (Signed)
Patient ID: [name], male   DOB: 1933-09-14, 82 y.o.   MRN: [id number]    Advanced Heart Failure Rounding Note   Subjective:    Started HD 3/25. Seems to be tolerating for now. 09/19/15 he was transferred to ICU due to acute respiratory distress. Placed on Bipap, improved with ongoing HD for fluid removal.   Getting HD this morning. No complaints.  BP stable.   09/10/2015: EF 60-65% MV severely calcified. Grade II DD. Moderate AS    Objective:   Weight Range:  Vital Signs:   Temp:  [97.8 F (36.6 C)-98.4 F (36.9 C)] 97.8 F (36.6 C) (03/31 0415) Pulse Rate:  [51-92] 92 (03/31 0415) Resp:  [16-18] 16 (03/31 0415) BP: (128-138)/(40-42) 134/41 mmHg (03/31 0415) SpO2:  [95 %-99 %] 95 % (03/31 0415) Weight:  [177 lb 14.6 oz (80.7 kg)-189 lb 1.6 oz (85.775 kg)] 177 lb 14.6 oz (80.7 kg) (03/31 0415) Last BM Date: 09/21/15  Weight change: Filed Weights   09/22/15 1343 09/23/15 0845 09/24/15 0415  Weight: 175 lb 0.7 oz (79.4 kg) 189 lb 1.6 oz (85.775 kg) 177 lb 14.6 oz (80.7 kg)    Intake/Output:   Intake/Output Summary (Last 24 hours) at 09/24/15 0827 Last data filed at 09/23/15 1700  Gross per 24 hour  Intake    243 ml  Output     21 ml  Net    222 ml     Physical Exam: General:  Chronically ill appearing and Elderly appearing. NAD. In bed.  HEENT: normal, [place] in place Neck: supple. JVP 7-8 cm.  Carotids 2+ bilat; no bruits. No thyromegaly or nodule noted.  Cor: PMI nondisplaced. RRR. No rubs, gallops. 3/6 HSM RUSB.  Lungs: Slight basilar crackles, on 02 via [place]. Abdomen: soft, NT, ND, no HSM. No bruits or masses. +BS  Extremities: no cyanosis, clubbing, rash.  R and LLE SCDs. R and LLE trace ankle edema. LUE AVF  Neuro: alert & orientedx3, cranial nerves grossly intact. moves all 4 extremities w/o difficulty. Affect pleasant GU: Foley.   Telemetry: Reviewed, Sinus Brady 50s   Labs: Basic Metabolic Panel:  Recent Labs Lab 09/21/15 0951 09/22/15 0254   09/23/15 0533 09/24/15 0505 09/24/15 0702  NA 137 136  --  136 135 134*  K 4.4 4.1  --  4.0 4.1 4.1  CL 97* 98*  --  98* 97* 98*  CO2 27 25  --  28 26 23   GLUCOSE 148* 117*  --  137* 155* 161*  BUN 57* 72*  --  39* 57* 58*  CREATININE 4.34* 5.30*  --  4.17* 5.85* 5.92*  CALCIUM 8.4* 8.3*  < > 8.3* 8.3* 8.4*  PHOS 6.0*  --   --   --   --  6.4*  < > = values in this interval not displayed.  Liver Function Tests:  Recent Labs Lab 09/21/15 0951 09/24/15 0702  AST 66*  --   ALT 69*  --   ALKPHOS 103  --   BILITOT 1.5*  --   PROT 6.7  --   ALBUMIN 2.6* 2.4*   No results for input(s): LIPASE, AMYLASE in the last 168 hours. No results for input(s): AMMONIA in the last 168 hours.  CBC:  Recent Labs Lab 09/18/15 0337 09/21/15 0951 09/22/15 0930 09/24/15 0505 09/24/15 0702  WBC 16.3* 12.6* 9.5 7.2 7.5  HGB 11.0* 10.8* 10.0* 10.2* 10.3*  HCT 35.8* 36.0* 31.8* 32.8* 33.0*  MCV 89.5 91.6 89.3 89.6  89.9  PLT 136* 150 139* 140* 129*    Cardiac Enzymes: No results for input(s): CKTOTAL, CKMB, CKMBINDEX, TROPONINI in the last 168 hours.  BNP: BNP (last 3 results)  Recent Labs  09/09/15 1326 09/09/15 1542  BNP 605.6* 729.6*    ProBNP (last 3 results) No results for input(s): PROBNP in the last 8760 hours.    Other results:  Imaging: No results found.   Medications:     Scheduled Medications: . antiseptic oral rinse  7 mL Mouth Rinse q12n4p  . atorvastatin  80 mg Oral q1800  . benzonatate  100 mg Oral BID  . chlorhexidine  15 mL Mouth Rinse BID  . cyanocobalamin  1,000 mcg Intramuscular Once  . darbepoetin (ARANESP) injection - DIALYSIS  60 mcg Intravenous Q Wed-HD  . feeding supplement (NEPRO CARB STEADY)  237 mL Oral TID BM  . insulin aspart  0-15 Units Subcutaneous TID WC  . insulin aspart  0-5 Units Subcutaneous QHS  . insulin glargine  15 Units Subcutaneous Daily  . isosorbide mononitrate  60 mg Oral QHS  . levothyroxine  175 mcg Oral QAC  breakfast  . memantine  5 mg Oral Daily  . multivitamin  1 tablet Oral QHS  . pantoprazole  40 mg Oral Q0600  . sodium chloride flush  3 mL Intravenous Q12H  . sulfamethoxazole-trimethoprim  1 tablet Oral Daily  . Warfarin - Pharmacist Dosing Inpatient   Does not apply q1800    Infusions:    PRN Medications: sodium chloride, sodium chloride, sodium chloride, acetaminophen, albuterol, alteplase, guaifenesin, heparin, heparin, lidocaine (PF), lidocaine-prilocaine, ondansetron (ZOFRAN) IV, pentafluoroprop-tetrafluoroeth, sodium chloride flush   Assessment/ Plan /Discussion.    1. Acute on chronic diastolic CHF: EF 123456, moderate diastolic dysfunction.Volume status improved with HD. 2. ESRD: Started HD 3/25.  - HD this morning.  3. Atrial fibrillation: Paroxysmal, in NSR. No beta blocker with bradycardia. - Warfarin on hold with supra-therapeutic INR. Pharmacy dosing.  Last dose 09/16/15. 4. CAD: s/p CABG, stable. Continue statin.  5. AS: Moderate on echo.  6. Mitral stenosis: Mild on echo.  7. Hypothyroidism: TSH 1.5 . Continue 175 mcg synthroid. 8. HTN: BP stable off antihypertensives now that he is getting HD.  9. Anemia: Iron stores low. Got Fe this admission.  10. Dementia 11. Deconditioning: PT following. Recommending SNF. SW following for SNF 12.  Poor PO intake: Dietician input appreciated.   13. UTI: Klebsiella and E coli in urine, both sensitive to Bactrim.  Also with Staph in sputum sensitive to Bactrim.  Complete 10 days Bactrim.    14. Acute/Chronic Respiratory Failure - Transferred to ICU on NRB on 3./26.  - Stable on Nasal Cannula.  15. Disposition: Possibly to SNF today after HD if renal thinks stable.  Will need to complete 10 days of Bactrim, renally dosed.  To remain on warfarin for now.  Will follow with palliative care as outpatient to help determine if he will continue HD long-term.   Loralie Champagne 09/24/2015 8:27 AM

## 2015-09-24 NOTE — Progress Notes (Signed)
Physical Therapy Treatment Patient Details Name: [name] MRN: [id number] DOB: 09/05/1933 Today's Date: 09/24/2015    History of Present Illness [name] is a 82 y.o. male with complaint of 2-3 week history of lower extremity swelling, cough, dyspnea, and recent visit with primary physician, Dr. Philip Aspen who informed the patient and his daughter that he was in heart failure.  pt with fistula for anticipated HD. Rapid Response called due to respiratory distress and pt transferred to ICU 3/26.    PT Comments    Patient seen for mobility progression. Patient tolerated OOB pre gait activities well. Performed some in room ambulation with 2 person moderate assist. Use of both HHA dn RW throughout session. Saturations on 2 liters at rest 94% with activity 87% rebounded quickly. Will continue to see and progress as tolerated.   Follow Up Recommendations  SNF;Supervision/Assistance - 24 hour     Equipment Recommendations  None recommended by PT    Recommendations for Other Services       Precautions / Restrictions Precautions Precautions: Fall Precaution Comments: oxygen 6 liters Restrictions Weight Bearing Restrictions: No    Mobility  Bed Mobility Overal bed mobility: Needs Assistance Bed Mobility: Supine to Sit     Supine to sit: Mod assist;+2 for physical assistance;HOB elevated Sit to supine: Mod assist;+2 for physical assistance   General bed mobility comments: VCs for LE movement to EOB, +2 assist to rotate and elevate trunk to EOB. VCs for use of UE support  Transfers Overall transfer level: Needs assistance Equipment used: Rolling walker (2 wheeled);2 person hand held assist Transfers: Sit to/from Stand Sit to Stand: Mod assist;+2 physical assistance         General transfer comment: performed sit<>stand x3 with RW for pre-gait training, then performed sit<>stand with 3 musketeer assist for ambulatino  Ambulation/Gait Ambulation/Gait assistance: Mod  assist;+2 physical assistance Ambulation Distance (Feet): 12 Feet Assistive device: 2 person hand held assist Gait Pattern/deviations: Step-to pattern;Decreased stride length;Shuffle;Scissoring;Trunk flexed;Narrow base of support   Gait velocity interpretation: <1.8 ft/sec, indicative of risk for recurrent falls General Gait Details: Manual assist via 3 musketteer technique for ambulation in room. Patient ambulated 12 person assist. cues for increased step length and width, tactile facilitation for upright posture and weight shifts.   Stairs            Wheelchair Mobility    Modified Rankin (Stroke Patients Only)       Balance   Sitting-balance support: Bilateral upper extremity supported Sitting balance-Leahy Scale: Fair     Standing balance support: During functional activity Standing balance-Leahy Scale: Poor                      Cognition Arousal/Alertness: Awake/alert Behavior During Therapy: Flat affect Overall Cognitive Status: History of cognitive impairments - at baseline                      Exercises Other Exercises Other Exercises: pre-gait training sit<>stand x3 for transfer training Other Exercises: standing weight shifts with 2 person assist Other Exercises: standing marching single leg stance with 2 person assist Other Exercises: Standing toe taps with 2 person assist    General Comments        Pertinent Vitals/Pain Pain Assessment: Faces Faces Pain Scale: Hurts a little bit Pain Location: back Pain Intervention(s): Monitored during session    Home Living  Prior Function            PT Goals (current goals can now be found in the care plan section) Acute Rehab PT Goals Patient Stated Goal: none stated PT Goal Formulation: With patient/family Time For Goal Achievement: 09/22/15 Potential to Achieve Goals: Fair Progress towards PT goals: Progressing toward goals    Frequency  Min  3X/week    PT Plan Current plan remains appropriate    Co-evaluation             End of Session Equipment Utilized During Treatment: Gait belt;Oxygen Activity Tolerance: Patient tolerated treatment well Patient left: in bed;with call bell/phone within reach;with bed alarm set;with family/visitor present     Time: [id number] PT Time Calculation (min) (ACUTE ONLY): 24 min  Charges:  $Therapeutic Activity: 23-37 mins                    G CodesDuncan Dull 2015/10/09, 3:02 PM Alben Deeds, [place] DPT  [phone]

## 2015-09-24 NOTE — Progress Notes (Signed)
Subjective: Interval History: has no complaint, reminded of discussions yest.  Objective: Vital signs in last 24 hours: Temp:  [97.3 F (36.3 C)-98.4 F (36.9 C)] 97.3 F (36.3 C) (03/31 0820) Pulse Rate:  [48-92] 51 (03/31 0930) Resp:  [16-21] 19 (03/31 0830) BP: (123-138)/(39-49) 132/47 mmHg (03/31 0930) SpO2:  [95 %-99 %] 97 % (03/31 0820) Weight:  [80.7 kg (177 lb 14.6 oz)] 80.7 kg (177 lb 14.6 oz) (03/31 0820) Weight change: 2.875 kg (6 lb 5.4 oz)  Intake/Output from previous day: 03/30 0701 - 03/31 0700 In: 243 [P.O.:240; I.V.:3] Out: 21 [Urine:20; Stool:1] Intake/Output this shift:    General appearance: cooperative, pale, slowed mentation and confused Resp: rhonchi bibasilar Cardio: S1, S2 normal and systolic murmur: systolic ejection 3/6, crescendo at 2nd left intercostal space GI: soft, non-tender; bowel sounds normal; no masses,  no organomegaly Extremities: AVF LUA   Lab Results:  Recent Labs  09/24/15 0505 09/24/15 0702  WBC 7.2 7.5  HGB 10.2* 10.3*  HCT 32.8* 33.0*  PLT 140* 129*   BMET:  Recent Labs  09/24/15 0505 09/24/15 0702  NA 135 134*  K 4.1 4.1  CL 97* 98*  CO2 26 23  GLUCOSE 155* 161*  BUN 57* 58*  CREATININE 5.85* 5.92*  CALCIUM 8.3* 8.4*    Recent Labs  09/22/15 0930  PTH 28  Comment   Iron Studies: No results for input(s): IRON, TIBC, TRANSFERRIN, FERRITIN in the last 72 hours.  Studies/Results: No results found.  I have reviewed the patient's current medications.  Assessment/Plan: 1 ESRD discussions with family yest, will see how does next few days. If not better, no HD 2 DM controlled 3 Afib reg today on Coumadin 4 AD 5 PVD 6 Dementia 7 Hypothyroid P HD, follow and see if debill improves.    LOS: 15 days   Tamila Gaulin L 09/24/2015,9:53 AM

## 2015-09-24 NOTE — Progress Notes (Signed)
ANTICOAGULATION Consult Note - Follow up consult  Pharmacy Consult for Coumadin  Indication: atrial fibrillation  Allergies  Allergen Reactions  . Betadine [Povidone Iodine] Rash  . Iodinated Diagnostic Agents Rash and Other (See Comments)    Does fine with premedications for cath  . Latex Hives    Patient Measurements: Height: 5\' 10"  (177.8 cm) Weight: 177 lb 14.6 oz (80.7 kg) IBW/kg (Calculated) : 73  Vital Signs: Temp: 97.8 F (36.6 C) (03/31 0415) Temp Source: Oral (03/31 0415) BP: 134/41 mmHg (03/31 0415) Pulse Rate: 92 (03/31 0415)  Labs:  Recent Labs  09/22/15 0254 09/22/15 0930 09/23/15 0335 09/23/15 0533 09/24/15 0505 09/24/15 0702  HGB  --  10.0*  --   --  10.2* 10.3*  HCT  --  31.8*  --   --  32.8* 33.0*  PLT  --  139*  --   --  140* 129*  LABPROT 37.6*  --  34.2*  --  32.1*  --   INR 3.94*  --  3.48*  --  3.19*  --   CREATININE 5.30*  --   --  4.17* 5.85* 5.92*    Estimated Creatinine Clearance: 10.4 mL/min (by C-G formula based on Cr of 5.92).   Assessment: 78 yom admitted 09/09/2015 with SOB. Patient on coumadin PTA for AFib. Pharmacy consulted to continue dosing inpatient.   INR still elevated at 3.19 but trending down despite no coumadin since 3/23. LFTs only mildly elevated. Patient is now on bactrim that may also impact INR. On amiodarone PTA which has been stopped due to bradycardia.  PTA coumadin dose = 2.5mg  Mon/Tue/Wed/Sat and 5mg  Sun/Thurs  Goal of Therapy:  INR 2-3 Monitor platelets by anticoagulation protocol: Yes   Plan:  1) No coumadin tonight 2) Daily INR   Melburn Popper, PharmD Clinical Pharmacy Resident Pager: [phone] 09/24/2015 8:18 AM

## 2015-09-24 NOTE — Progress Notes (Signed)
CSW received phone call from son regarding bed offers. CSW provided bed offers on today, however family has not made a decision regarding facility preference.CSW informed son that patient may possibly be discharged on today, and will need to secure a bed soon. Son informed CSW that he will make some phone calls and then follow back up with CSW regarding choice.    Patient is currently in HD. CSW will continue to follow and provide support to patient while in hospital.   Lucius Conn, [place] Worker [facility] Ph: [phone]

## 2015-09-24 NOTE — Progress Notes (Signed)
PT Cancellation Note  Patient Details Name: [name] MRN: [id number] DOB: [birth year]   Cancelled Treatment:    Reason Eval/Treat Not Completed: Patient at procedure or test/unavailable (at HD)   Duncan Dull 09/24/2015, 9:01 AM Alben Deeds, PT DPT  [phone]

## 2015-09-24 NOTE — Progress Notes (Signed)
Son provided CSW with facility preferences (1) [facility] (2) [facility] and (3) [facility]. At this moment, patient is not medically stable and ready for discharge. CSW will follow up with facility preferences regarding bed availability.   CSW will continue to follow and provide support to patient while in hospital.   Lucius Conn, [place] Worker [facility] Ph: [phone]

## 2015-09-25 LAB — BASIC METABOLIC PANEL
ANION GAP: 8 (ref 5–15)
BUN: 25 mg/dL — ABNORMAL HIGH (ref 6–20)
CALCIUM: 8 mg/dL — AB (ref 8.9–10.3)
CO2: 29 mmol/L (ref 22–32)
CREATININE: 3.78 mg/dL — AB (ref 0.61–1.24)
Chloride: 98 mmol/L — ABNORMAL LOW (ref 101–111)
GFR, EST AFRICAN AMERICAN: 16 mL/min — AB (ref >60)
GFR, EST NON AFRICAN AMERICAN: 14 mL/min — AB (ref >60)
Glucose, Bld: 169 mg/dL — ABNORMAL HIGH (ref 65–99)
Potassium: 3.8 mmol/L (ref 3.5–5.1)
SODIUM: 135 mmol/L (ref 135–145)

## 2015-09-25 LAB — GLUCOSE, CAPILLARY
GLUCOSE-CAPILLARY: 199 mg/dL — AB (ref 65–99)
GLUCOSE-CAPILLARY: 205 mg/dL — AB (ref 65–99)
Glucose-Capillary: 154 mg/dL — ABNORMAL HIGH (ref 65–99)
Glucose-Capillary: 194 mg/dL — ABNORMAL HIGH (ref 65–99)

## 2015-09-25 LAB — PROTIME-INR
INR: 2.41 — AB (ref 0.00–1.49)
Prothrombin Time: 25.9 seconds — ABNORMAL HIGH (ref 11.6–15.2)

## 2015-09-25 MED ORDER — WARFARIN SODIUM 2.5 MG PO TABS
2.5000 mg | ORAL_TABLET | Freq: Once | ORAL | Status: AC
  Administered 2015-09-25: 2.5 mg via ORAL
  Filled 2015-09-25: qty 1

## 2015-09-25 NOTE — Progress Notes (Signed)
Subjective: Interval History: has complaints cough with thick phlegm.  Objective: Vital signs in last 24 hours: Temp:  [98.2 F (36.8 C)-98.6 F (37 C)] 98.4 F (36.9 C) (04/01 0610) Pulse Rate:  [48-52] 49 (04/01 0610) Resp:  [17-22] 21 (03/31 2119) BP: (127-149)/(36-50) 133/43 mmHg (04/01 0610) SpO2:  [96 %] 96 % (04/01 0610) Weight:  [77.656 kg (171 lb 3.2 oz)-79.7 kg (175 lb 11.3 oz)] 77.656 kg (171 lb 3.2 oz) (04/01 0329) Weight change: -5.075 kg (-11 lb 3 oz)  Intake/Output from previous day: 03/31 0701 - 04/01 0700 In: 840 [P.O.:840] Out: 1000  Intake/Output this shift: Total I/O In: 120 [P.O.:120] Out: -   General appearance: cooperative, no distress, pale and slowed mentation Resp: diminished breath sounds bilaterally and rhonchi bibasilar Cardio: S1, S2 normal and systolic murmur: systolic ejection 2/6, crescendo at 2nd left intercostal space GI: soft, non-tender; bowel sounds normal; no masses,  no organomegaly Extremities: AVF LUA  Lab Results:  Recent Labs  09/24/15 0505 09/24/15 0702  WBC 7.2 7.5  HGB 10.2* 10.3*  HCT 32.8* 33.0*  PLT 140* 129*   BMET:  Recent Labs  09/24/15 0702 09/25/15 0410  NA 134* 135  K 4.1 3.8  CL 98* 98*  CO2 23 29  GLUCOSE 161* 169*  BUN 58* 25*  CREATININE 5.92* 3.78*  CALCIUM 8.4* 8.0*    Recent Labs  09/22/15 0930  PTH 28  Comment   Iron Studies: No results for input(s): IRON, TIBC, TRANSFERRIN, FERRITIN in the last 72 hours.  Studies/Results: No results found.  I have reviewed the patient's current medications.  Assessment/Plan: 1 ESRD on Hd yest did well.   2Debill main issue. Apparently up yest 3 DM controlled 4 AS  5 Anemia esa/Fe 6 HPTH vit D 7 CAD 8 Dementia P HD on Mon,  Mobilize, if to cont will need NH    LOS: 16 days   Kaylan Yates L 09/25/2015,9:24 AM

## 2015-09-25 NOTE — Progress Notes (Signed)
   Subjective: Pt appears comfortable  No CP  Breathing is OK   Objective: Filed Vitals:   09/24/15 1223 09/24/15 2119 09/25/15 0329 09/25/15 0610  BP: 140/41 131/47  133/43  Pulse: 49 51  49  Temp: 98.2 F (36.8 C) 98.6 F (37 C)  98.4 F (36.9 C)  TempSrc: Oral Oral  Oral  Resp: 22 21    Height:      Weight: 175 lb 11.3 oz (79.7 kg)  171 lb 3.2 oz (77.656 kg)   SpO2:  96%  96%   Weight change: -11 lb 3 oz (-5.075 kg)  Intake/Output Summary (Last 24 hours) at 09/25/15 0931 Last data filed at 09/25/15 0852  Gross per 24 hour  Intake    960 ml  Output   1000 ml  Net    -40 ml    General: Alert, awake, oriented x3, in no acute distress Neck:  JVP is normal Heart: Regular rate and rhythm, without murmurs, rubs, gallops.  Lungs: Clear to auscultation.  No rales or wheezes. Exemities:  No edema.   Neuro: Grossly intact, nonfocal.  Tele:  SB   Lab Results: Results for orders placed or performed during the hospital encounter of 09/09/15 (from the past 24 hour(s))  Glucose, capillary     Status: Abnormal   Collection Time: 09/24/15  4:53 PM  Result Value Ref Range   Glucose-Capillary 266 (H) 65 - 99 mg/dL  Glucose, capillary     Status: Abnormal   Collection Time: 09/24/15  9:18 PM  Result Value Ref Range   Glucose-Capillary 174 (H) 65 - 99 mg/dL   Comment 1 Notify RN    Comment 2 Document in Chart   Protime-INR     Status: Abnormal   Collection Time: 09/25/15  4:10 AM  Result Value Ref Range   Prothrombin Time 25.9 (H) 11.6 - 15.2 seconds   INR 2.41 (H) 0.00 - 99991111  Basic metabolic panel     Status: Abnormal   Collection Time: 09/25/15  4:10 AM  Result Value Ref Range   Sodium 135 135 - 145 mmol/L   Potassium 3.8 3.5 - 5.1 mmol/L   Chloride 98 (L) 101 - 111 mmol/L   CO2 29 22 - 32 mmol/L   Glucose, Bld 169 (H) 65 - 99 mg/dL   BUN 25 (H) 6 - 20 mg/dL   Creatinine, Ser 3.78 (H) 0.61 - 1.24 mg/dL   Calcium 8.0 (L) 8.9 - 10.3 mg/dL   GFR calc non Af Amer 14 (L)  >60 mL/min   GFR calc Af Amer 16 (L) >60 mL/min   Anion gap 8 5 - 15  Glucose, capillary     Status: Abnormal   Collection Time: 09/25/15  5:59 AM  Result Value Ref Range   Glucose-Capillary 154 (H) 65 - 99 mg/dL   Comment 1 Notify RN    Comment 2 Document in Chart     Studies/Results: No results found.  Medications: Reviewed    @PROBHOSP @  1  Acute on chronic diastolic CHF  LVEF 60 to 123456 Volume controlled with HD  2  ESRD  Dialysis.    3  Afib.  PAF      4.  CAD  S/p CABG  No symptoms to sugg angina    5  Dispo  Pan for SNF  SW is working on this    LOS: 16 days   Dorris Carnes 09/25/2015, 9:31 AM

## 2015-09-25 NOTE — Progress Notes (Signed)
ANTICOAGULATION Consult Note - Follow up consult  Pharmacy Consult for Coumadin  Indication: atrial fibrillation  Patient Measurements: Height: 5\' 10"  (177.8 cm) Weight: 171 lb 3.2 oz (77.656 kg) (bed scal) IBW/kg (Calculated) : 73  Vital Signs: Temp: 98.4 F (36.9 C) (04/01 0610) Temp Source: Oral (04/01 0610) BP: 133/43 mmHg (04/01 0610) Pulse Rate: 49 (04/01 0610)  Labs:  Recent Labs  09/23/15 0335  09/24/15 0505 09/24/15 0702 09/25/15 0410  HGB  --   --  10.2* 10.3*  --   HCT  --   --  32.8* 33.0*  --   PLT  --   --  140* 129*  --   LABPROT 34.2*  --  32.1*  --  25.9*  INR 3.48*  --  3.19*  --  2.41*  CREATININE  --   < > 5.85* 5.92* 3.78*  < > = values in this interval not displayed.  Estimated Creatinine Clearance: 16.4 mL/min (by C-G formula based on Cr of 3.78).   Assessment: 27 yom admitted 09/09/2015 with SOB. Patient on coumadin PTA for AFib. Pharmacy consulted to continue dosing inpatient.   INR has trended down to therapeutic. LFTs only mildly elevated. Patient is s/p bactrim x1 day that may also impact INR. On amiodarone PTA which has been stopped due to bradycardia.  PTA coumadin dose = 2.5mg  Mon/Tue/Wed/Sat and 5mg  Sun/Thurs  Goal of Therapy:  INR 2-3 Monitor platelets by anticoagulation protocol: Yes   Plan:  1) Coumadin 2.5 mg PO x1 2) Daily INR   Joya San, PharmD Clinical Pharmacy Resident Pager # [phone] 09/25/2015 10:46 AM

## 2015-09-26 LAB — BASIC METABOLIC PANEL
ANION GAP: 8 (ref 5–15)
BUN: 40 mg/dL — ABNORMAL HIGH (ref 6–20)
CALCIUM: 8.1 mg/dL — AB (ref 8.9–10.3)
CHLORIDE: 94 mmol/L — AB (ref 101–111)
CO2: 28 mmol/L (ref 22–32)
CREATININE: 5.04 mg/dL — AB (ref 0.61–1.24)
GFR, EST AFRICAN AMERICAN: 11 mL/min — AB (ref >60)
GFR, EST NON AFRICAN AMERICAN: 10 mL/min — AB (ref >60)
Glucose, Bld: 211 mg/dL — ABNORMAL HIGH (ref 65–99)
POTASSIUM: 4.2 mmol/L (ref 3.5–5.1)
Sodium: 130 mmol/L — ABNORMAL LOW (ref 135–145)

## 2015-09-26 LAB — PROTIME-INR
INR: 1.9 — ABNORMAL HIGH (ref 0.00–1.49)
Prothrombin Time: 21.7 seconds — ABNORMAL HIGH (ref 11.6–15.2)

## 2015-09-26 LAB — GLUCOSE, CAPILLARY
GLUCOSE-CAPILLARY: 180 mg/dL — AB (ref 65–99)
GLUCOSE-CAPILLARY: 189 mg/dL — AB (ref 65–99)
Glucose-Capillary: 300 mg/dL — ABNORMAL HIGH (ref 65–99)
Glucose-Capillary: 184 mg/dL — ABNORMAL HIGH (ref 65–99)

## 2015-09-26 MED ORDER — MAGNESIUM HYDROXIDE 400 MG/5ML PO SUSP
15.0000 mL | Freq: Every day | ORAL | Status: DC
  Administered 2015-09-26: 15 mL via ORAL
  Filled 2015-09-26: qty 30

## 2015-09-26 MED ORDER — WARFARIN SODIUM 5 MG PO TABS
5.0000 mg | ORAL_TABLET | Freq: Once | ORAL | Status: AC
  Administered 2015-09-26: 5 mg via ORAL
  Filled 2015-09-26: qty 1

## 2015-09-26 NOTE — Progress Notes (Signed)
Subjective: Pt sleeping comfortably Objective: Filed Vitals:   09/25/15 1230 09/25/15 1937 09/25/15 2253 09/26/15 0730  BP: 140/85 137/37 131/42 127/44  Pulse: 49 49 48 51  Temp: 98.7 F (37.1 C) 98.5 F (36.9 C)  98.4 F (36.9 C)  TempSrc: Oral Oral  Oral  Resp: 22 18  18   Height:      Weight:    177 lb 9.6 oz (80.559 kg)  SpO2: 98% 99%  90%   Weight change:   Intake/Output Summary (Last 24 hours) at 09/26/15 1100 Last data filed at 09/26/15 0900  Gross per 24 hour  Intake    820 ml  Output    100 ml  Net    720 ml    General: sleeping  Neck:  JVP is normal Heart: Regular rate and rhythm, without murmurs, rubs, gallops.  Lungs: Clear to auscultation.  No rales or wheezes. Exemities:  No edema.   Neuro: Grossly intact, nonfocal.  TEel:  SR   Lab Results: Results for orders placed or performed during the hospital encounter of 09/09/15 (from the past 24 hour(s))  Glucose, capillary     Status: Abnormal   Collection Time: 09/25/15 11:53 AM  Result Value Ref Range   Glucose-Capillary 194 (H) 65 - 99 mg/dL   Comment 1 Notify RN    Comment 2 Document in Chart   Glucose, capillary     Status: Abnormal   Collection Time: 09/25/15  4:19 PM  Result Value Ref Range   Glucose-Capillary 199 (H) 65 - 99 mg/dL   Comment 1 Notify RN    Comment 2 Document in Chart   Glucose, capillary     Status: Abnormal   Collection Time: 09/25/15  9:52 PM  Result Value Ref Range   Glucose-Capillary 205 (H) 65 - 99 mg/dL   Comment 1 Notify RN    Comment 2 Document in Chart   Protime-INR     Status: Abnormal   Collection Time: 09/26/15  4:18 AM  Result Value Ref Range   Prothrombin Time 21.7 (H) 11.6 - 15.2 seconds   INR 1.90 (H) 0.00 - 99991111  Basic metabolic panel     Status: Abnormal   Collection Time: 09/26/15  4:18 AM  Result Value Ref Range   Sodium 130 (L) 135 - 145 mmol/L   Potassium 4.2 3.5 - 5.1 mmol/L   Chloride 94 (L) 101 - 111 mmol/L   CO2 28 22 - 32 mmol/L   Glucose, Bld 211 (H) 65 - 99 mg/dL   BUN 40 (H) 6 - 20 mg/dL   Creatinine, Ser 5.04 (H) 0.61 - 1.24 mg/dL   Calcium 8.1 (L) 8.9 - 10.3 mg/dL   GFR calc non Af Amer 10 (L) >60 mL/min   GFR calc Af Amer 11 (L) >60 mL/min   Anion gap 8 5 - 15  Glucose, capillary     Status: Abnormal   Collection Time: 09/26/15  6:53 AM  Result Value Ref Range   Glucose-Capillary 180 (H) 65 - 99 mg/dL   Comment 1 Notify RN    Comment 2 Document in Chart     Studies/Results: No results found.  Medications:  @PROBHOSP @  1  Acute on chronic diastolic CHF  LVEF 60 to 123456  Volume OK    2.  CAD No sympomts of angina    3  PAF  INR 1.9  Pharmacy helping    4  ESRD   DIalysis  5  Dispo  Await word on placement  SW checking on facilities   Will check on PT  Also no BM x 3 days   LOS: 17 days   [name] 09/26/2015, 11:00 AM

## 2015-09-26 NOTE — Progress Notes (Signed)
Subjective: Interval History: has no complaint, eating better.  Objective: Vital signs in last 24 hours: Temp:  [98.4 F (36.9 C)-98.7 F (37.1 C)] 98.4 F (36.9 C) (04/02 0730) Pulse Rate:  [48-51] 51 (04/02 0730) Resp:  [18-22] 18 (04/02 0730) BP: (127-140)/(37-85) 127/44 mmHg (04/02 0730) SpO2:  [90 %-99 %] 90 % (04/02 0730) Weight:  [80.559 kg (177 lb 9.6 oz)] 80.559 kg (177 lb 9.6 oz) (04/02 0730) Weight change:   Intake/Output from previous day: 04/01 0701 - 04/02 0700 In: 700 [P.O.:700] Out: 100 [Urine:100] Intake/Output this shift:    General appearance: cooperative, pale and slowed mentation Resp: diminished breath sounds bilaterally Cardio: S1, S2 normal and systolic murmur: systolic ejection 2/6, crescendo at 2nd left intercostal space GI: pos bs, soft, liver down 5 cm Extremities: AVF LUA  Lab Results:  Recent Labs  09/24/15 0505 09/24/15 0702  WBC 7.2 7.5  HGB 10.2* 10.3*  HCT 32.8* 33.0*  PLT 140* 129*   BMET:  Recent Labs  09/25/15 0410 09/26/15 0418  NA 135 130*  K 3.8 4.2  CL 98* 94*  CO2 29 28  GLUCOSE 169* 211*  BUN 25* 40*  CREATININE 3.78* 5.04*  CALCIUM 8.0* 8.1*   No results for input(s): PTH in the last 72 hours. Iron Studies: No results for input(s): IRON, TIBC, TRANSFERRIN, FERRITIN in the last 72 hours.  Studies/Results: No results found.  I have reviewed the patient's current medications.  Assessment/Plan: 1 ESRD for HD in am.  To go to [facility],  Vol ok.  2 Anemia stable, on esa 3 HPTH vit D 4 Dementia 5 Afib reg this am 6 AS  P HD, up in chair, mobilize, vit D, esa, NHP    LOS: 17 days   [name] L 09/26/2015,8:46 AM

## 2015-09-26 NOTE — Progress Notes (Signed)
ANTICOAGULATION Consult Note - Follow up consult  Pharmacy Consult for Coumadin  Indication: atrial fibrillation  Patient Measurements: Height: 5\' 10"  (177.8 cm) Weight: 177 lb 9.6 oz (80.559 kg) (bedscale) IBW/kg (Calculated) : 73  Vital Signs: Temp: 98.4 F (36.9 C) (04/02 0730) Temp Source: Oral (04/02 0730) BP: 127/44 mmHg (04/02 0730) Pulse Rate: 51 (04/02 0730)  Labs:  Recent Labs  09/24/15 0505 09/24/15 0702 09/25/15 0410 09/26/15 0418  HGB 10.2* 10.3*  --   --   HCT 32.8* 33.0*  --   --   PLT 140* 129*  --   --   LABPROT 32.1*  --  25.9* 21.7*  INR 3.19*  --  2.41* 1.90*  CREATININE 5.85* 5.92* 3.78* 5.04*    Estimated Creatinine Clearance: 12.3 mL/min (by C-G formula based on Cr of 5.04).   Assessment: 50 yom admitted 09/09/2015 with SOB. Patient on coumadin PTA for AFib. Pharmacy consulted to continue dosing inpatient.   INR has trended down to slightly subtherapeutic. LFTs only mildly elevated. Patient is s/p bactrim x1 day that may also impact INR. On amiodarone PTA which has been stopped due to bradycardia.  PTA coumadin dose = 2.5mg  Mon/Tue/Wed/Sat and 5mg  Sun/Thurs  Goal of Therapy:  INR 2-3 Monitor platelets by anticoagulation protocol: Yes   Plan:  1) Coumadin 5 mg PO x1 2) Daily INR   Joya San, PharmD Clinical Pharmacy Resident Pager # [phone] 09/26/2015 8:21 AM

## 2015-09-27 ENCOUNTER — Inpatient Hospital Stay (HOSPITAL_COMMUNITY): Payer: Medicare Other

## 2015-09-27 LAB — CBC
HEMATOCRIT: 32.1 % — AB (ref 39.0–52.0)
Hemoglobin: 9.6 g/dL — ABNORMAL LOW (ref 13.0–17.0)
MCH: 27 pg (ref 26.0–34.0)
MCHC: 29.9 g/dL — ABNORMAL LOW (ref 30.0–36.0)
MCV: 90.2 fL (ref 78.0–100.0)
PLATELETS: 155 10*3/uL (ref 150–400)
RBC: 3.56 MIL/uL — ABNORMAL LOW (ref 4.22–5.81)
RDW: 16.6 % — AB (ref 11.5–15.5)
WBC: 8.2 10*3/uL (ref 4.0–10.5)

## 2015-09-27 LAB — BASIC METABOLIC PANEL
ANION GAP: 9 (ref 5–15)
BUN: 57 mg/dL — AB (ref 6–20)
CALCIUM: 8.1 mg/dL — AB (ref 8.9–10.3)
CO2: 28 mmol/L (ref 22–32)
CREATININE: 6.1 mg/dL — AB (ref 0.61–1.24)
Chloride: 90 mmol/L — ABNORMAL LOW (ref 101–111)
GFR, EST AFRICAN AMERICAN: 9 mL/min — AB (ref >60)
GFR, EST NON AFRICAN AMERICAN: 8 mL/min — AB (ref >60)
Glucose, Bld: 189 mg/dL — ABNORMAL HIGH (ref 65–99)
Potassium: 4.9 mmol/L (ref 3.5–5.1)
Sodium: 127 mmol/L — ABNORMAL LOW (ref 135–145)

## 2015-09-27 LAB — GLUCOSE, CAPILLARY
GLUCOSE-CAPILLARY: 166 mg/dL — AB (ref 65–99)
Glucose-Capillary: 154 mg/dL — ABNORMAL HIGH (ref 65–99)
Glucose-Capillary: 242 mg/dL — ABNORMAL HIGH (ref 65–99)

## 2015-09-27 LAB — PROTIME-INR
INR: 2.16 — AB (ref 0.00–1.49)
PROTHROMBIN TIME: 23.9 s — AB (ref 11.6–15.2)

## 2015-09-27 MED ORDER — LIDOCAINE-PRILOCAINE 2.5-2.5 % EX CREA
1.0000 | TOPICAL_CREAM | CUTANEOUS | Status: DC | PRN
Start: 2015-09-27 — End: 2015-09-27

## 2015-09-27 MED ORDER — WARFARIN SODIUM 2.5 MG PO TABS
2.5000 mg | ORAL_TABLET | Freq: Once | ORAL | Status: AC
  Administered 2015-09-27: 2.5 mg via ORAL
  Filled 2015-09-27: qty 1

## 2015-09-27 MED ORDER — RESOURCE THICKENUP CLEAR PO POWD
ORAL | Status: DC | PRN
  Filled 2015-09-27: qty 125

## 2015-09-27 MED ORDER — LIDOCAINE HCL (PF) 1 % IJ SOLN: 5.0000 mL | INTRAMUSCULAR | Status: DC | PRN

## 2015-09-27 MED ORDER — ALTEPLASE 2 MG IJ SOLR: 2.0000 mg | Freq: Once | INTRAMUSCULAR | Status: DC | PRN

## 2015-09-27 MED ORDER — SODIUM CHLORIDE 0.9 % IV SOLN: 100.0000 mL | INTRAVENOUS | Status: DC | PRN

## 2015-09-27 MED ORDER — PENTAFLUOROPROP-TETRAFLUOROETH EX AERO: 1.0000 "application " | INHALATION_SPRAY | CUTANEOUS | Status: DC | PRN

## 2015-09-27 MED ORDER — HEPARIN SODIUM (PORCINE) 1000 UNIT/ML DIALYSIS
100.0000 [IU]/kg | INTRAMUSCULAR | Status: DC | PRN
  Filled 2015-09-27: qty 9

## 2015-09-27 MED ORDER — HEPARIN SODIUM (PORCINE) 1000 UNIT/ML DIALYSIS: 1000.0000 [IU] | INTRAMUSCULAR | Status: DC | PRN

## 2015-09-27 NOTE — Progress Notes (Signed)
ANTICOAGULATION Consult Note - Follow up consult  Pharmacy Consult for Coumadin  Indication: atrial fibrillation  Patient Measurements: Height: 5\' 10"  (177.8 cm) Weight:  (in gerichair, wt to be obtained on unit) IBW/kg (Calculated) : 73  Vital Signs: Temp: 97 F (36.1 C) (04/03 1322) Temp Source: Oral (04/03 1322) BP: 148/44 mmHg (04/03 1322) Pulse Rate: 54 (04/03 1322)  Labs:  Recent Labs  09/25/15 0410 09/26/15 0418 09/27/15 0540  HGB  --   --  9.6*  HCT  --   --  32.1*  PLT  --   --  155  LABPROT 25.9* 21.7* 23.9*  INR 2.41* 1.90* 2.16*  CREATININE 3.78* 5.04* 6.10*    Estimated Creatinine Clearance: 10.1 mL/min (by C-G formula based on Cr of 6.1).   Assessment: 29 yom admitted 09/09/2015 with SOB. Patient on coumadin PTA for AFib. Pharmacy consulted to continue dosing inpatient.   INR has trended down to slightly subtherapeutic. LFTs only mildly elevated. Patient is s/p bactrim x1 day that may also impact INR. On amiodarone PTA which has been stopped due to bradycardia. INR 2.1 at goal will restart home dose may need increase as amio cleared  PTA coumadin dose = 2.5mg  Mon/Tue/Wed/Sat and 5mg  Sun/Thurs  Goal of Therapy:  INR 2-3 Monitor platelets by anticoagulation protocol: Yes   Plan:  1) Coumadin 2.5 mg PO x1 2) Daily INR   Bonnita Nasuti Pharm.D. CPP, BCPS Clinical Pharmacist [phone] 09/27/2015 2:58 PM

## 2015-09-27 NOTE — Progress Notes (Signed)
Patient ID: [name], male   DOB: 25-Mar-1934, 82 y.o.   MRN: [id number]    Advanced Heart Failure Rounding Note   Subjective:    Started HD 3/25. Seems to be tolerating for now. 09/19/15 he was transferred to ICU due to acute respiratory distress. Placed on Bipap, improved with ongoing HD for fluid removal. Stabilized and now back on telemetry.  To get HD this am. Had significant choking episode early this morning with cough syrup.  Now NPO. Says he has lots of phlegm/snot.  CXR overnight unremarkable only showing small left pleural effusion as previous.   INR 2.16. Pharmacy dosing. Getting doses again as of 09/25/15.  09/10/2015: EF 60-65% MV severely calcified. Grade II DD. Moderate AS    Objective:   Weight Range:  Vital Signs:   Temp:  [97.7 F (36.5 C)-99 F (37.2 C)] 98.9 F (37.2 C) (04/03 0205) Pulse Rate:  [50-53] 53 (04/03 0112) Resp:  [18] 18 (04/03 0112) BP: (127-146)/(39-44) 143/42 mmHg (04/03 0112) SpO2:  [90 %-97 %] 94 % (04/03 0205) Weight:  [177 lb 9.6 oz (80.559 kg)] 177 lb 9.6 oz (80.559 kg) (04/02 0730) Last BM Date: 09/23/15 (MOM given today)  Weight change: Filed Weights   09/24/15 1223 09/25/15 0329 09/26/15 0730  Weight: 175 lb 11.3 oz (79.7 kg) 171 lb 3.2 oz (77.656 kg) 177 lb 9.6 oz (80.559 kg)    Intake/Output:   Intake/Output Summary (Last 24 hours) at 09/27/15 0713 Last data filed at 09/26/15 2209  Gross per 24 hour  Intake   1200 ml  Output    175 ml  Net   1025 ml     Physical Exam: General:  Chronically ill appearing and Elderly appearing. NAD. In bed.  HEENT: normal, [place] in place Neck: supple. JVP 8-9 cm.  Carotids 2+ bilat; no bruits. No thyromegaly or nodule noted.  Cor: PMI nondisplaced. RRR. No rubs, gallops. 3/6 HSM RUSB.  Lungs: Slight basilar crackles,scattered rhonci. on 02 via [place]. Abdomen: soft, NT, ND, no HSM. No bruits or masses. +BS  Extremities: no cyanosis, clubbing, rash.  R and LLE SCDs. R and LLE trace ankle  edema. LUE AVF  Neuro: alert & orientedx3, cranial nerves grossly intact. moves all 4 extremities w/o difficulty. Affect pleasant GU: Foley.   Telemetry: Reviewed, Sinus Brady 50s   Labs: Basic Metabolic Panel:  Recent Labs Lab 09/21/15 0951  09/24/15 0505 09/24/15 0702 09/25/15 0410 09/26/15 0418 09/27/15 0540  NA 137  < > 135 134* 135 130* 127*  K 4.4  < > 4.1 4.1 3.8 4.2 4.9  CL 97*  < > 97* 98* 98* 94* 90*  CO2 27  < > 26 23 29 28 28   GLUCOSE 148*  < > 155* 161* 169* 211* 189*  BUN 57*  < > 57* 58* 25* 40* 57*  CREATININE 4.34*  < > 5.85* 5.92* 3.78* 5.04* 6.10*  CALCIUM 8.4*  < > 8.3* 8.4* 8.0* 8.1* 8.1*  PHOS 6.0*  --   --  6.4*  --   --   --   < > = values in this interval not displayed.  Liver Function Tests:  Recent Labs Lab 09/21/15 0951 09/24/15 0702  AST 66*  --   ALT 69*  --   ALKPHOS 103  --   BILITOT 1.5*  --   PROT 6.7  --   ALBUMIN 2.6* 2.4*   No results for input(s): LIPASE, AMYLASE in the last 168 hours. No  results for input(s): AMMONIA in the last 168 hours.  CBC:  Recent Labs Lab 09/21/15 0951 09/22/15 0930 09/24/15 0505 09/24/15 0702 09/27/15 0540  WBC 12.6* 9.5 7.2 7.5 8.2  HGB 10.8* 10.0* 10.2* 10.3* 9.6*  HCT 36.0* 31.8* 32.8* 33.0* 32.1*  MCV 91.6 89.3 89.6 89.9 90.2  PLT 150 139* 140* 129* 155    Cardiac Enzymes: No results for input(s): CKTOTAL, CKMB, CKMBINDEX, TROPONINI in the last 168 hours.  BNP: BNP (last 3 results)  Recent Labs  09/09/15 1326 09/09/15 1542  BNP 605.6* 729.6*    ProBNP (last 3 results) No results for input(s): PROBNP in the last 8760 hours.    Other results:  Imaging: Dg Chest Port 1 View  09/27/2015  CLINICAL DATA:  Dyspnea EXAM: PORTABLE CHEST 1 VIEW COMPARISON:  09/19/2015 FINDINGS: Cardiac shadow is enlarged but stable. Postsurgical changes are again noted. Chronic fibrotic changes are noted. Left basilar infiltrate is seen. Small associated left pleural effusion is seen. No bony  abnormality is noted. IMPRESSION: Left basilar changes with associated effusion. Electronically Signed   By: Inez Catalina M.D.   On: 09/27/2015 03:10     Medications:     Scheduled Medications: . amoxicillin  500 mg Oral QHS  . antiseptic oral rinse  7 mL Mouth Rinse q12n4p  . atorvastatin  80 mg Oral q1800  . benzonatate  100 mg Oral BID  . chlorhexidine  15 mL Mouth Rinse BID  . cyanocobalamin  1,000 mcg Intramuscular Once  . darbepoetin (ARANESP) injection - DIALYSIS  60 mcg Intravenous Q Wed-HD  . feeding supplement (NEPRO CARB STEADY)  237 mL Oral TID BM  . insulin aspart  0-15 Units Subcutaneous TID WC  . insulin aspart  0-5 Units Subcutaneous QHS  . insulin glargine  15 Units Subcutaneous Daily  . isosorbide mononitrate  60 mg Oral QHS  . levothyroxine  175 mcg Oral QAC breakfast  . magnesium hydroxide  15 mL Oral Daily  . memantine  5 mg Oral Daily  . multivitamin  1 tablet Oral QHS  . pantoprazole  40 mg Oral Q0600  . sevelamer carbonate  2.4 g Oral TID  . sodium chloride flush  3 mL Intravenous Q12H  . Warfarin - Pharmacist Dosing Inpatient   Does not apply q1800    Infusions:    PRN Medications: sodium chloride, sodium chloride, sodium chloride, acetaminophen, albuterol, alteplase, guaifenesin, heparin, lidocaine (PF), lidocaine-prilocaine, ondansetron (ZOFRAN) IV, pentafluoroprop-tetrafluoroeth, sodium chloride flush   Assessment/ Plan /Discussion.    1. Acute on chronic diastolic CHF: EF 123456, moderate diastolic dysfunction.Volume status improved with HD. 2. ESRD: Started HD 3/25.  - HD this morning.  3. Atrial fibrillation: Paroxysmal, in NSR. No beta blocker with bradycardia. - Warfarin on hold with supra-therapeutic INR. Pharmacy dosing.  Last dose 09/16/15. 4. CAD: s/p CABG, stable. Continue statin.  5. AS: Moderate on echo.  6. Mitral stenosis: Mild on echo.  7. Hypothyroidism: TSH 1.5 . Continue 175 mcg synthroid. 8. HTN: BP stable off  antihypertensives now that he is getting HD.  9. Anemia: Iron stores low. Got Fe this admission.  10. Dementia 11. Deconditioning: PT following. Recommending SNF. SW following for SNF 12.  Poor PO intake: Dietician input appreciated.   13. UTI: Klebsiella and E coli in urine.  Also with Staph in sputum.  Complete course of amoxicillin.     14. Acute/Chronic Respiratory Failure - Transferred to ICU on NRB on 3./26.  - Stable on Nasal Cannula.  15. Dysphagia - Got significantly choked up on cough syrup this morning.  - Will have Speech therapy see to evaluate for appropriate diet.  16. Disposition: To SNF once stable per renal. Will need outpatient HD.  Currently on amoxicillin.  To remain on warfarin for now.  Will follow with palliative care as outpatient to help determine if he will continue HD long-term.   Shirley Friar PA-C 09/27/2015 7:13 AM  Advanced Heart Failure Team Pager [phone] (M-F; 7a - 4p)  Please contact [facility] for night-coverage after hours (4p -7a ) and weekends on [email or web address]  Patient seen with PA, agree with the above note.  Possible aspiration of cough syrup overnight. CXR with left basilar infiltrate, but this was seen in the past.  Coughing more.  Will have swallow evaluation today to assess for aspiration risk.   Getting HD this morning, tolerating ok.  Needs SNF placement, hopefully soon but should have swallow evaluation first.   Continue amoxicillin for 10 day course.   Loralie Champagne 09/27/2015 8:58 AM

## 2015-09-27 NOTE — Care Management Important Message (Signed)
Important Message  Patient Details  Name: [name] MRN: [id number] Date of Birth: 1934-02-29   Medicare Important Message Given:  Yes    [name] 09/27/2015, 1:01 PM

## 2015-09-27 NOTE — Progress Notes (Signed)
SLP Cancellation Note  Patient Details Name: AIKEN ARAGONA MRN: [id number] DOB: 10/02/1933    Cancelled treatment:       Reason Eval/Treat Not Completed: Patient at procedure or test/unavailable.  SLP will follow up as able.0  Gunnar Fusi, M.A., CCC-SLP [id number]  [place] 09/27/2015, 9:10 AM

## 2015-09-27 NOTE — Plan of Care (Signed)
Cardiology [place]  Called regarding significant episode of choking today following cough syrup.  There was significant concern by the nurse about the pt's ability to swallow.  Have made the pt NPO until further formal evaluation can be made previously.  Frann Rider, MD

## 2015-09-27 NOTE — Progress Notes (Signed)
PT Cancellation Note  Patient Details Name: [name] MRN: [id number] DOB: [birth year]   Cancelled Treatment:    Reason Eval/Treat Not Completed: Patient at procedure or test/unavailable (at HD)   Duncan Dull 09/27/2015, 8:59 AM Alben Deeds, PT DPT  [phone]

## 2015-09-27 NOTE — Procedures (Signed)
Pt seen on HD.  AP 130  VP 180.  BFR 300.  Tolerating HD well.  DC MOM.  Awaiting NH placement.  Ox1 only.

## 2015-09-27 NOTE — Evaluation (Signed)
Clinical/Bedside Swallow Evaluation Patient Details  Name: [name] MRN: [id number] Date of Birth: [birth year]  Today's Date: 09/27/2015 Time: SLP Start Time (ACUTE ONLY): 48 SLP Stop Time (ACUTE ONLY): 1430 SLP Time Calculation (min) (ACUTE ONLY): 35 min  Past Medical History:  Past Medical History  Diagnosis Date  . Hypertension   . Hyperlipidemia   . Hypothyroidism   . CAD (coronary artery disease)     a. prior CABG in 1997 with redo in 2000. b. last cath in 2008 - managed medically  . Type 2 diabetes mellitus ([place])   . Generalized weakness     without overt findings  . Peripheral vascular disease ([place])   . Carotid disease, bilateral ([place])     with multiple bilateral carotid surgeries  . Psoriasis   . Meniere's disease   . Degenerative joint disease   . Gout   . Orthopnea     Two-pillow  . CKD (chronic kidney disease), stage IV ([place])     a. Has fistula in place.   . Chronic diastolic CHF (congestive heart failure) ([place])   . GERD (gastroesophageal reflux disease)   . Atrial fibrillation ([place]) Aug. 2015    a. Dx 01/2014, s/p DCCV 03/06/14.  . Sinus bradycardia     a. Toprol D/C'd due to this.   . Shingles   . On home oxygen therapy     "2L" qhs (09/09/2015  . Dementia    Past Surgical History:  Past Surgical History  Procedure Laterality Date  . Coronary artery bypass graft  1997;  2002  . Carotid endarterectomy Bilateral "several times"    "5 on right; 2-3 on left" (09/09/2015)  . Lung removal, partial    . Lumbar laminectomy  July 2001  . Thoracotomy Left     due to fungal infection  . Shoulder surgery Bilateral   . Cardiac catheterization    . Appendectomy    . Av fistula placement Left 01/21/2013    Procedure: ARTERIOVENOUS (AV) FISTULA CREATION, Left Brachiocephalic;  Surgeon: Angelia Mould, MD;  Location: [facility];  Service: Vascular;  Laterality: Left;  . Cardiac catheterization  2008    L main irreg, LAD 80%, IMA-LAD & SVG-Diag patent, CFX 100%,  SVG-OM 90%, RCA 70%, SVG-RCA OK, EF nl, med rx, no vessels appropriate for PCI  . Tee without cardioversion N/A 03/06/2014    Procedure: TRANSESOPHAGEAL ECHOCARDIOGRAM (TEE);  Surgeon: Larey Dresser, MD;  Location: [facility];  Service: Cardiovascular;  Laterality: N/A;  . Cardioversion N/A 03/06/2014    Procedure: CARDIOVERSION;  Surgeon: Larey Dresser, MD;  Location: [facility] ENDOSCOPY;  Service: Cardiovascular;  Laterality: N/A;  . Blepharoplasty Bilateral    HPI:  [name] is a 82 y.o. male with complaint of 2-3 week history of lower extremity swelling, cough, dyspnea, and recent visit with primary physician, Dr. Philip Aspen who informed the patient and his daughter that he was in heart failure. pt with fistula for anticipated HD. Rapid Response called due to respiratory distress and pt transferred to ICU 3/26.   Assessment / Plan / Recommendation Clinical Impression  Patient presents with likely an acute reversible dysphagia as a result of multiple medical issues and deconditioning.  Oral motor exam remarkable for generalized weakness and missing dentition, which family reports is baseline.  Patient with baseline cough; however, trials of thin liquids via cup resulted in consistent throat clears in about 90% of trials.  Patient also demonstrated prolonged mastication of regular textures.  Nectar-thick  liquids reduced overt s/s of aspiration to 10% of opportunities.  Recommend initiation of Dys.3 textures and nectar-thick liquids via cup with full supervision to ensure strict use of safe swallow strategies.  SLP will follow closely for toleration and advancement.      Aspiration Risk  Moderate aspiration risk    Diet Recommendation Dysphagia 3 (Mech soft);Nectar-thick liquid   Liquid Administration via: Cup;No straw Medication Administration: Whole meds with puree Supervision: Patient able to self feed;Staff to assist with self feeding;Full supervision/cueing for compensatory  strategies Compensations: Minimize environmental distractions;Slow rate;Small sips/bites;Multiple dry swallows after each bite/sip    Other  Recommendations Oral Care Recommendations: Oral care BID Other Recommendations: Order thickener from pharmacy;Prohibited food (jello, ice cream, thin soups);Remove water pitcher;Have oral suction available   Follow up Recommendations  24 hour supervision/assistance;Skilled Nursing facility    Frequency and Duration min 2x/week  2 weeks       Prognosis Prognosis for Safe Diet Advancement: Good Barriers to Reach Goals: Cognitive deficits;Severity of deficits      Swallow Study   General HPI: [name] is a 82 y.o. male with complaint of 2-3 week history of lower extremity swelling, cough, dyspnea, and recent visit with primary physician, Dr. Philip Aspen who informed the patient and his daughter that he was in heart failure. pt with fistula for anticipated HD. Rapid Response called due to respiratory distress and pt transferred to ICU 3/26. Type of Study: Bedside Swallow Evaluation Previous Swallow Assessment: none on record  Diet Prior to this Study: NPO Temperature Spikes Noted: Yes (slight temp over night) Respiratory Status: Nasal cannula Behavior/Cognition: Cooperative;Pleasant mood;Lethargic/Drowsy;Requires cueing Oral Cavity Assessment: Dry Oral Care Completed by SLP: Recent completion by staff Oral Cavity - Dentition: Edentulous;Missing dentition Vision: Functional for self-feeding Self-Feeding Abilities: Needs assist;Needs set up Patient Positioning: Upright in chair Baseline Vocal Quality: Hoarse;Low vocal intensity Volitional Cough: Weak Volitional Swallow: Able to elicit    Oral/Motor/Sensory Function Overall Oral Motor/Sensory Function: Mild impairment (generalized weakness)   Ice Chips Ice chips: Within functional limits Presentation: Spoon   Thin Liquid Thin Liquid: Impaired Presentation: Cup;Spoon Pharyngeal  Phase  Impairments: Suspected delayed Swallow;Multiple swallows;Cough - Immediate;Cough - Delayed;Other (comments) (change in respirations)    Nectar Thick Nectar Thick Liquid: Within functional limits Presentation: Self Fed Pharyngeal Phase Impairments: Multiple swallows   Honey Thick Honey Thick Liquid: Not tested   Puree Puree: Within functional limits Presentation: Self Fed;Spoon Pharyngeal Phase Impairments: Multiple swallows   Solid   GO   Solid: Impaired Oral Phase Impairments: Poor awareness of bolus;Impaired mastication Pharyngeal Phase Impairments: Multiple swallows;Suspected delayed Dolores Frame., CCC-SLP [id number]  Samul Mcinroy 09/27/2015,2:46 PM

## 2015-09-27 NOTE — Progress Notes (Signed)
Inpatient Diabetes Program Recommendations  AACE/ADA: New Consensus Statement on Inpatient Glycemic Control (2015)  Target Ranges:  Prepandial:   less than 140 mg/dL      Peak postprandial:   less than 180 mg/dL (1-2 hours)      Critically ill patients:  140 - 180 mg/dL  Results for TRACYN, BORGHESE (MRN [id number]) as of 09/27/2015 11:22  Ref. Range 09/27/2015 05:40  Glucose Latest Ref Range: 65-99 mg/dL 189 (H)   Results for SHAFTER, KEEFOVER (MRN [id number]) as of 09/27/2015 11:22  Ref. Range 09/26/2015 06:53 09/26/2015 12:47 09/26/2015 16:48 09/26/2015 20:37  Glucose-Capillary Latest Ref Range: 65-99 mg/dL 180 (H) 300 (H) 184 (H) 189 (H)   Review of Glycemic Control  Diabetes history: DM2 Outpatient Diabetes medications: Lantus 15 units daily, Novolog 10 units BID Current orders for Inpatient glycemic control: Lantus 15 units daily, Novolog 0-15 units TID with meals, Novolog 0-5 units QHS  Inpatient Diabetes Program Recommendations: Insulin - Meal Coverage: Please consider ordering Novolog 4 units TID with meals for meal coverage (in addition to Novolog correction scale).  Thanks, Barnie Alderman, RN, MSN, CDE Diabetes Coordinator Inpatient Diabetes Program [phone] (Team Pager from [place] to [place]) [phone] (AP office) [phone] [facility] office) [phone] [facility] office)

## 2015-09-28 LAB — BASIC METABOLIC PANEL
Anion gap: 9 (ref 5–15)
BUN: 40 mg/dL — AB (ref 6–20)
CALCIUM: 8 mg/dL — AB (ref 8.9–10.3)
CHLORIDE: 94 mmol/L — AB (ref 101–111)
CO2: 29 mmol/L (ref 22–32)
CREATININE: 4.38 mg/dL — AB (ref 0.61–1.24)
GFR calc non Af Amer: 12 mL/min — ABNORMAL LOW (ref >60)
GFR, EST AFRICAN AMERICAN: 13 mL/min — AB (ref >60)
GLUCOSE: 173 mg/dL — AB (ref 65–99)
Potassium: 4.5 mmol/L (ref 3.5–5.1)
Sodium: 132 mmol/L — ABNORMAL LOW (ref 135–145)

## 2015-09-28 LAB — GLUCOSE, CAPILLARY
GLUCOSE-CAPILLARY: 175 mg/dL — AB (ref 65–99)
Glucose-Capillary: 146 mg/dL — ABNORMAL HIGH (ref 65–99)
Glucose-Capillary: 268 mg/dL — ABNORMAL HIGH (ref 65–99)
Glucose-Capillary: 207 mg/dL — ABNORMAL HIGH (ref 65–99)

## 2015-09-28 LAB — PROTIME-INR
INR: 2.03 — ABNORMAL HIGH (ref 0.00–1.49)
PROTHROMBIN TIME: 22.9 s — AB (ref 11.6–15.2)

## 2015-09-28 MED ORDER — WARFARIN SODIUM 5 MG PO TABS
5.0000 mg | ORAL_TABLET | Freq: Once | ORAL | Status: AC
  Administered 2015-09-28: 5 mg via ORAL
  Filled 2015-09-28: qty 1

## 2015-09-28 NOTE — Clinical Social Work Note (Signed)
CSW was provided an update from RN stating that patient will not be discharged today due to him having to receive his dialysis treatment tomorrow. CSW will follow and assist with discharge tomorrow, after patient receives treatment.    Radonna Ricker, Student Social Work Intern  [employer]  [phone]

## 2015-09-28 NOTE — Clinical Social Work Placement (Addendum)
   CLINICAL SOCIAL WORK PLACEMENT  NOTE  Date:  09/28/2015  Patient Details  Name: [name] MRN: [id number] Date of Birth: 12-08-1933  Clinical Social Work is seeking post-discharge placement for this patient at the [place] level of care (*CSW will initial, date and re-position this form in  chart as items are completed):  Yes   Patient/family provided with [facility] Work Department's list of facilities offering this level of care within the geographic area requested by the patient (or if unable, by the patient's family).  Yes   Patient/family informed of their freedom to choose among providers that offer the needed level of care, that participate in Medicare, Medicaid or managed care program needed by the patient, have an available bed and are willing to accept the patient.      Patient/family informed of [facility]'s ownership interest in [facility] and [facility], as well as of the fact that they are under no obligation to receive care at these facilities.  PASRR submitted to EDS on 09/17/15     PASRR number received on 09/17/15     Existing PASRR number confirmed on       FL2 transmitted to all facilities in geographic area requested by pt/family on 09/17/15     FL2 transmitted to all facilities within larger geographic area on 09/17/15     Patient informed that his/her managed care company has contracts with or will negotiate with certain facilities, including the following:        Yes   Patient/family informed of bed offers received.  Patient chooses bed at  [facility])     Physician recommends and patient chooses bed at      Patient to be transferred to  [facility]) on 09/28/15.  Patient to be transferred to facility by  [facility])     Patient family notified on 09/28/15 of transfer.  Name of family member notified:  Patient and family made aware     PHYSICIAN Please prepare priority discharge summary, including  medications, Please prepare prescriptions, Please sign DNR     Additional Comment:    _______________________________________________ Radonna Ricker, Student-SW 09/28/2015, 11:17 AM

## 2015-09-28 NOTE — Progress Notes (Signed)
Speech Language Pathology Treatment: Dysphagia  Patient Details Name: [name] MRN: [id number] DOB: 02-Mar-1934 Today's Date: 09/28/2015 Time: 1002-1011 SLP Time Calculation (min) (ACUTE ONLY): 9 min  Assessment / Plan / Recommendation Clinical Impression  Skilled treatment session focused on addressing dysphagia goals.  Patient tolerating current diet per RN and spouse.  Patient was observed consuming Dys.3 textures and nectar-thick liquids with no overt s/s of aspiration with Mod cues for portion control and pacing due to cognitive deficits, which are baseline.  Advanced trials of thin liquids via cup result in immediate subtle throat clears in 30% of opportunities; improved from yesterday's session.  Recommend to continue with current diet orders and continue trials of thin with SLP to advance at bedside when s/s decrease to a minimal level.  Wife educated and in agreement with plan.  Patient with likely improvements in the next week; continue current plan of care at the next level of care.      HPI HPI: [name] is a 82 y.o. male with complaint of 2-3 week history of lower extremity swelling, cough, dyspnea, and recent visit with primary physician, Dr. Philip Aspen who informed the patient and his daughter that he was in heart failure. pt with fistula for anticipated HD. Rapid Response called due to respiratory distress and pt transferred to ICU 3/26.      SLP Plan  Continue with current plan of care     Recommendations  Diet recommendations: Dysphagia 3 (mechanical soft);Nectar-thick liquid Liquids provided via: Cup Medication Administration: Whole meds with puree Supervision: Patient able to self feed;Full supervision/cueing for compensatory strategies Compensations: Minimize environmental distractions;Slow rate;Small sips/bites;Multiple dry swallows after each bite/sip Postural Changes and/or Swallow Maneuvers: Seated upright 90 degrees             Oral Care Recommendations:  Oral care BID Follow up Recommendations: 24 hour supervision/assistance;Skilled Nursing facility Plan: Continue with current plan of care     GO              Carmelia Roller., CCC-SLP [id number]   [place] 09/28/2015, 10:30 AM

## 2015-09-28 NOTE — Progress Notes (Signed)
ANTICOAGULATION Consult Note - Follow up consult  Pharmacy Consult for Coumadin  Indication: atrial fibrillation  Patient Measurements: Height: 5\' 10"  (177.8 cm) Weight:  (in gerichair, wt to be obtained on unit) IBW/kg (Calculated) : 73  Vital Signs: Temp: 97.9 F (36.6 C) (04/04 1343) Temp Source: Oral (04/04 1343) BP: 152/44 mmHg (04/04 1343) Pulse Rate: 54 (04/04 1343)  Labs:  Recent Labs  09/26/15 0418 09/27/15 0540 09/28/15 0600  HGB  --  9.6*  --   HCT  --  32.1*  --   PLT  --  155  --   LABPROT 21.7* 23.9* 22.9*  INR 1.90* 2.16* 2.03*  CREATININE 5.04* 6.10* 4.38*    Estimated Creatinine Clearance: 14.1 mL/min (by C-G formula based on Cr of 4.38).   Assessment: 82 yom admitted 09/09/2015 with SOB. Patient on coumadin PTA for AFib. Pharmacy consulted to continue dosing inpatient.   INR now therapeutic. LFTs only mildly elevated. Patient is s/p bactrim x1 day that may also impact INR. On amiodarone PTA which has been stopped due to bradycardia. May need home dose increased as amiodarone clears.  PTA coumadin dose = 2.5mg  Mon/Tue/Wed/Sat and 5mg  Sun/Thurs  Goal of Therapy:  INR 2-3 Monitor platelets by anticoagulation protocol: Yes   Plan:  1) Coumadin 5 mg PO x1 2) Daily INR

## 2015-09-28 NOTE — Progress Notes (Signed)
Patient ID: [name], male   DOB: [birth year], 82 y.o.   MRN: [id number]    Advanced Heart Failure Rounding Note   Subjective:    Started HD 3/25. Seems to be tolerating for now. 09/19/15 he was transferred to ICU due to acute respiratory distress. Placed on Bipap, improved with ongoing HD for fluid removal. Stabilized and now back on telemetry.  Had choking episode on cough syrup 4/3. Speech therapy saw and recommends Dysphagia 3 diet.   Tolerating HD fine. Needs SNF placement and then decision on Dialysis site will be made.  Will check with CSW.   Feels OK this morning. Still coughing some.  Did not sleep well overnight.  Breathing stable. No lightheadedness or dizziness.   INR 2.03. Pharmacy dosing.   09/10/2015: EF 60-65% MV severely calcified. Grade II DD. Moderate AS    Objective:   Weight Range:  Vital Signs:   Temp:  [96.7 F (35.9 C)-99.1 F (37.3 C)] 97.4 F (36.3 C) (04/03 2100) Pulse Rate:  [52-69] 52 (04/03 2100) Resp:  [16-20] 18 (04/03 2100) BP: (119-167)/(38-65) 147/42 mmHg (04/03 2100) SpO2:  [95 %-98 %] 95 % (04/03 2100) Weight:  [182 lb 3.2 oz (82.645 kg)] 182 lb 3.2 oz (82.645 kg) (04/03 0818) Last BM Date: 09/23/15  Weight change: Filed Weights   09/26/15 0730 09/27/15 0700 09/27/15 0818  Weight: 177 lb 9.6 oz (80.559 kg) 182 lb 3.2 oz (82.645 kg) 182 lb 3.2 oz (82.645 kg)    Intake/Output:   Intake/Output Summary (Last 24 hours) at 09/28/15 0723 Last data filed at 09/28/15 0500  Gross per 24 hour  Intake    480 ml  Output   1625 ml  Net  -1145 ml     Physical Exam: General:  Chronically ill appearing and Elderly appearing. NAD. In bed.  HEENT: normal, Gilbert in place Neck: supple. JVP 8-9 cm.  Carotids 2+ bilat; no bruits. No thyromegaly or nodule noted.  Cor: PMI nondisplaced. RRR. No rubs, gallops. 3/6 HSM RUSB.  Lungs: Scattered rhonchi that clear with cough, on 02 via . Abdomen: soft, NT, ND, no HSM. No bruits or masses. +BS    Extremities: no cyanosis, clubbing, rash.  R and LLE SCDs. R and LLE trace ankle edema. LUE AVF  Neuro: alert & orientedx3, cranial nerves grossly intact. moves all 4 extremities w/o difficulty. Affect pleasant GU: Foley.   Telemetry: Reviewed, Sinus Brady 50-60s   Labs: Basic Metabolic Panel:  Recent Labs Lab 09/21/15 0951  09/24/15 0702 09/25/15 0410 09/26/15 0418 09/27/15 0540 09/28/15 0600  NA 137  < > 134* 135 130* 127* 132*  K 4.4  < > 4.1 3.8 4.2 4.9 4.5  CL 97*  < > 98* 98* 94* 90* 94*  CO2 27  < > 23 29 28 28 29   GLUCOSE 148*  < > 161* 169* 211* 189* 173*  BUN 57*  < > 58* 25* 40* 57* 40*  CREATININE 4.34*  < > 5.92* 3.78* 5.04* 6.10* 4.38*  CALCIUM 8.4*  < > 8.4* 8.0* 8.1* 8.1* 8.0*  PHOS 6.0*  --  6.4*  --   --   --   --   < > = values in this interval not displayed.  Liver Function Tests:  Recent Labs Lab 09/21/15 0951 09/24/15 0702  AST 66*  --   ALT 69*  --   ALKPHOS 103  --   BILITOT 1.5*  --   PROT 6.7  --  ALBUMIN 2.6* 2.4*   No results for input(s): LIPASE, AMYLASE in the last 168 hours. No results for input(s): AMMONIA in the last 168 hours.  CBC:  Recent Labs Lab 09/21/15 0951 09/22/15 0930 09/24/15 0505 09/24/15 0702 09/27/15 0540  WBC 12.6* 9.5 7.2 7.5 8.2  HGB 10.8* 10.0* 10.2* 10.3* 9.6*  HCT 36.0* 31.8* 32.8* 33.0* 32.1*  MCV 91.6 89.3 89.6 89.9 90.2  PLT 150 139* 140* 129* 155    Cardiac Enzymes: No results for input(s): CKTOTAL, CKMB, CKMBINDEX, TROPONINI in the last 168 hours.  BNP: BNP (last 3 results)  Recent Labs  09/09/15 1326 09/09/15 1542  BNP 605.6* 729.6*    ProBNP (last 3 results) No results for input(s): PROBNP in the last 8760 hours.    Other results:  Imaging: Dg Chest Port 1 View  09/27/2015  CLINICAL DATA:  Dyspnea EXAM: PORTABLE CHEST 1 VIEW COMPARISON:  09/19/2015 FINDINGS: Cardiac shadow is enlarged but stable. Postsurgical changes are again noted. Chronic fibrotic changes are noted. Left  basilar infiltrate is seen. Small associated left pleural effusion is seen. No bony abnormality is noted. IMPRESSION: Left basilar changes with associated effusion. Electronically Signed   By: Inez Catalina M.D.   On: 09/27/2015 03:10     Medications:     Scheduled Medications: . amoxicillin  500 mg Oral QHS  . antiseptic oral rinse  7 mL Mouth Rinse q12n4p  . atorvastatin  80 mg Oral q1800  . benzonatate  100 mg Oral BID  . chlorhexidine  15 mL Mouth Rinse BID  . cyanocobalamin  1,000 mcg Intramuscular Once  . darbepoetin (ARANESP) injection - DIALYSIS  60 mcg Intravenous Q Wed-HD  . feeding supplement (NEPRO CARB STEADY)  237 mL Oral TID BM  . insulin aspart  0-15 Units Subcutaneous TID WC  . insulin aspart  0-5 Units Subcutaneous QHS  . insulin glargine  15 Units Subcutaneous Daily  . isosorbide mononitrate  60 mg Oral QHS  . levothyroxine  175 mcg Oral QAC breakfast  . memantine  5 mg Oral Daily  . multivitamin  1 tablet Oral QHS  . pantoprazole  40 mg Oral Q0600  . sevelamer carbonate  2.4 g Oral TID  . sodium chloride flush  3 mL Intravenous Q12H  . Warfarin - Pharmacist Dosing Inpatient   Does not apply q1800    Infusions:    PRN Medications: sodium chloride, sodium chloride, sodium chloride, acetaminophen, albuterol, alteplase, guaifenesin, heparin, lidocaine (PF), lidocaine-prilocaine, ondansetron (ZOFRAN) IV, pentafluoroprop-tetrafluoroeth, RESOURCE THICKENUP CLEAR, sodium chloride flush   Assessment/ Plan /Discussion.    1. Acute on chronic diastolic CHF: EF [id number], moderate diastolic dysfunction.Volume status improved with HD. 2. ESRD: Started HD 3/25.  - Tolerating HD for now. Will f/u with renal to make sure he is set up for outpatient 3. Atrial fibrillation: Paroxysmal, in NSR. No beta blocker with bradycardia. - Warfarin on hold with supra-therapeutic INR. Pharmacy dosing.  Last dose 09/16/15. 4. CAD: s/p CABG, stable. Continue statin.  5. AS: Moderate  on echo.   6. Mitral stenosis: Mild on echo.  7. Hypothyroidism: TSH 1.5 . Continue 175 mcg synthroid. 8. HTN: BP stable off antihypertensives now that he is getting HD.  9. Anemia: Iron stores low. Got Fe this admission.  10. Dementia 11. Deconditioning: PT following. Recommending SNF. SW following for SNF 12.  Poor PO intake: Dietician input appreciated.   13. UTI: Klebsiella and E coli in urine.  Also with Staph in sputum.   -  On amoxicillin.  Should complete 10 day course.  14. Acute/Chronic Respiratory Failure - Transferred to ICU on NRB on 3./26.  - Stable on Nasal Cannula.  15. Dysphagia - Dysphagia 3 diet. 16. Disposition: To SNF once bed available and outpatient HD arranged. Currently on amoxicillin.  To remain on warfarin for now.  Will follow with palliative care as outpatient to help determine if he will continue HD long-term.   Per Dr Mercy Moore, SNF selection will determine Dialysis site. Will let CSW know.   Satira Mccallum Tillery PA-C 09/28/2015 7:23 AM  Advanced Heart Failure Team Pager [phone] (M-F; 7a - 4p)  Please contact [facility] for night-coverage after hours (4p -7a ) and weekends on [email or web address]  Patient seen with PA, agree with the above note.  He is stable today.  Tolerating HD.  There may be a SNF bed available today.  I think it would be ok if her were to go to SNF today.    Loralie Champagne 09/28/2015 10:38 AM

## 2015-09-28 NOTE — Progress Notes (Signed)
Nutrition Follow-up  DOCUMENTATION CODES:   Not applicable  INTERVENTION:  -Continue Nepro Shake po TID, each supplement provides 425 kcal and 19 grams protein -Continue snacks -Per SLP, NDD3 Diet, Nectar Thick Liquids -Multivitamin w/ Minerals  NUTRITION DIAGNOSIS:   Inadequate oral intake related to poor appetite as evidenced by per patient/family report, meal completion < 50%.  Progressing GOAL:   Patient will meet greater than or equal to 90% of their needs  Progressing  MONITOR:   PO intake, Supplement acceptance, Weight trends, Labs, I & O's, Skin  REASON FOR ASSESSMENT:   Consult Poor PO  ASSESSMENT:   82 y.o. male with history of T2DM who presents with complaint of 2-3 week history of lower extremity swelling, cough, and dyspnea.  Mr. Alsept was a little sleepy during my visit, had HD yesterday;  wife did most of the talking.  PO intake averaging 75% over past 8 meals. 100% past 5/6 meals. Tray in his room during visit, completed ~90%. Denies any chewing/swallowing problems. No complaints about NDD3 Diet. No nausea/vomiting. Does complain of some soreness. Wt relatively stable since admission.  SW currently looking for placement at SNF.  Labs and Medications reviewed.   Diet Order:  DIET DYS 3 Room service appropriate?: Yes; Fluid consistency:: Nectar Thick  Skin:  Wound (see comment) (Stage I Pressure Injury on Sacrum)  Last BM:  09/18/2015  Height:   Ht Readings from Last 1 Encounters:  09/09/15 5\' 10"  (1.778 m)    Weight:   Wt Readings from Last 1 Encounters:  09/27/15 182 lb 3.2 oz (82.645 kg)    Ideal Body Weight:  75.5 kg  BMI:  Body mass index is 26.14 kg/(m^2).  Estimated Nutritional Needs:   Kcal:  1800-2000  Protein:  100-110 grams (1.2 g/kg)   Fluid:  1.8 L/day  EDUCATION NEEDS:   No education needs identified at this time  Satira Anis. Renesmae Donahey, MS, RD LDN After Hours/Weekend Pager [phone]

## 2015-09-28 NOTE — Progress Notes (Signed)
Occupational Therapy Treatment Patient Details Name: [name] MRN: [id number] DOB: 03/02/1934 Today's Date: 09/28/2015    History of present illness [name] is a 82 y.o. male with complaint of 2-3 week history of lower extremity swelling, cough, dyspnea, and recent visit with primary physician, Dr. Philip Aspen who informed the patient and his daughter that he was in heart failure.  pt with fistula for anticipated HD. Rapid Response called due to respiratory distress and pt transferred to ICU 3/26.   OT comments  This 82 yo male admitted with above presents to acute OT with continued weakness,however increased independence with bed mobility today with cues given. He will continue to benefit from acute OT with follow up OT at  SNF to get to a higher level of independence for wife to be able to manage him at home.  Follow Up Recommendations  SNF;Supervision/Assistance - 24 hour    Equipment Recommendations  3 in 1 bedside comode;Wheelchair (measurements OT);Wheelchair cushion (measurements OT);Hospital bed;Other (comment)       Precautions / Restrictions Precautions Precautions: Fall Precaution Comments: oxygen 3 liters Restrictions Weight Bearing Restrictions: No       Mobility Bed Mobility Overal bed mobility: Needs Assistance Bed Mobility: Rolling;Sidelying to Sit Rolling: Mod assist (with VCs for sequencing, HOB up , and use of rail) Sidelying to sit: Mod assist;HOB elevated       General bed mobility comments: max A to scoot to EOB  Transfers Overall transfer level: Needs assistance   Transfers: Squat Pivot Transfers     Squat pivot transfers: Max assist          Balance Overall balance assessment: Needs assistance Sitting-balance support: Bilateral upper extremity supported;Feet supported Sitting balance-Leahy Scale: Fair                             ADL Overall ADL's : Needs assistance/impaired                         Toilet  Transfer: Maximal assistance;Squat-pivot (bed (raised)>recliner going to pt's left)                              Cognition   Behavior During Therapy: Flat affect Overall Cognitive Status: History of cognitive impairments - at baseline                                    Pertinent Vitals/ Pain       Pain Assessment: No/denies pain         Frequency Min 2X/week     Progress Toward Goals  OT Goals(current goals can now be found in the care plan section)  Progress towards OT goals: Progressing toward goals     Plan Discharge plan remains appropriate       End of Session Equipment Utilized During Treatment: Gait belt;Oxygen (3 liters)   Activity Tolerance Patient tolerated treatment well   Patient Left in chair;with call bell/phone within reach;with chair alarm set (hoyer pad under him)   Nurse Communication          Time: [phone] OT Time Calculation (min): 24 min  Charges: OT General Charges $OT Visit: 1 Procedure OT Treatments $Therapeutic Activity: 23-37 mins  Almon Register [id number] 09/28/2015, 11:52 AM

## 2015-09-28 NOTE — Progress Notes (Signed)
Physical Therapy Treatment Patient Details Name: [name] MRN: [id number] DOB: [birth year] Today's Date: 09/28/2015    History of Present Illness [name] is a 82 y.o. male with complaint of 2-3 week history of lower extremity swelling, cough, dyspnea, and recent visit with primary physician, Dr. Philip Aspen who informed the patient and his daughter that he was in heart failure.  pt with fistula for anticipated HD. Rapid Response called due to respiratory distress and pt transferred to ICU 3/26.    PT Comments    Pt progressing gradually with PT. At this time the patient was able to perform transfers and ambulation with +1 assist. Cues needed throughout session for safety and use of rw. Based upon current mobility level, recommending SNF when medically stable.   Follow Up Recommendations  SNF;Supervision/Assistance - 24 hour     Equipment Recommendations  None recommended by PT    Recommendations for Other Services       Precautions / Restrictions Precautions Precautions: Fall Precaution Comments: oxygen 3 liters Restrictions Weight Bearing Restrictions: No    Mobility  Bed Mobility Overal bed mobility: Needs Assistance Bed Mobility: Sit to Supine       Sit to supine: Min assist   General bed mobility comments: min assist with LEs  Transfers Overall transfer level: Needs assistance Equipment used: Rolling walker (2 wheeled);2 person hand held assist Transfers: Sit to/from Stand Sit to Stand: Mod assist         General transfer comment: from chair, cues for hand position  Ambulation/Gait Ambulation/Gait assistance: Min assist Ambulation Distance (Feet): 15 Feet Assistive device: Rolling walker (2 wheeled) Gait Pattern/deviations: Step-through pattern;Narrow base of support;Trunk flexed Gait velocity: decreased   General Gait Details: assist needed with maneuvering rw around objects and for turning.    Stairs            Wheelchair Mobility     Modified Rankin (Stroke Patients Only)       Balance Overall balance assessment: Needs assistance Sitting-balance support: No upper extremity supported Sitting balance-Leahy Scale: Fair     Standing balance support: Bilateral upper extremity supported Standing balance-Leahy Scale: Poor Standing balance comment: using rw                    Cognition Arousal/Alertness: Awake/alert Behavior During Therapy: Flat affect Overall Cognitive Status: History of cognitive impairments - at baseline                      Exercises      General Comments        Pertinent Vitals/Pain Pain Assessment: No/denies pain Pain Intervention(s): Monitored during session    Home Living                      Prior Function            PT Goals (current goals can now be found in the care plan section) Acute Rehab PT Goals Patient Stated Goal: get back to bed PT Goal Formulation: With patient/family Time For Goal Achievement: 10/12/15 Potential to Achieve Goals: Fair Progress towards PT goals: Progressing toward goals    Frequency  Min 3X/week    PT Plan Current plan remains appropriate    Co-evaluation             End of Session Equipment Utilized During Treatment: Gait belt;Oxygen;Other (comment) (having to remove O2 during part of ambulation.) Activity Tolerance: Patient tolerated treatment well Patient  left: in bed;with call bell/phone within reach;with bed alarm set;with family/visitor present     Time: 1450-1505 PT Time Calculation (min) (ACUTE ONLY): 15 min  Charges:  $Gait Training: 8-22 mins                    G Codes:      Cassell Clement, PT, CSCS Pager [phone] Office [phone]  09/28/2015, 4:12 PM

## 2015-09-28 NOTE — Clinical Social Work Note (Signed)
CSW provided patient with bed offers. Patient chose [facility]  as facility choice. CSW contacted facility to inform that patient is medically stable for discharge today. Per Ivin Booty, they will take patient on today. Discharge Summary to be sent via [employer]. Facility representative Rollene Fare to complete paperwork with family at [facility] will then arrange transportation and inform RN and patient of transfer time. MED necessity to be placed on patient's chart. No further concerns were reported at this time. CSW will sign off.   Radonna Ricker, Student Social Work Intern  [employer]  [phone]

## 2015-09-28 NOTE — Progress Notes (Signed)
S: Feels better.  Eating some O:BP 147/42 mmHg  Pulse 52  Temp(Src) 97.4 F (36.3 C) (Oral)  Resp 18  Ht 5\' 10"  (1.778 m)  Wt 82.645 kg (182 lb 3.2 oz)  BMI 26.14 kg/m2  SpO2 95%  Intake/Output Summary (Last 24 hours) at 09/28/15 1019 Last data filed at 09/28/15 0900  Gross per 24 hour  Intake    720 ml  Output   1625 ml  Net   -905 ml   Weight change: 2.086 kg (4 lb 9.6 oz) [id number] and alert [facility]: Loletha Grayer, reg Resp: scattered rhonchi Abd:+ BS NTND Ext: No edema   LUA AVF + bruit NEURO:CNI Ox3 no asterixis    . amoxicillin  500 mg Oral QHS  . antiseptic oral rinse  7 mL Mouth Rinse q12n4p  . atorvastatin  80 mg Oral q1800  . benzonatate  100 mg Oral BID  . chlorhexidine  15 mL Mouth Rinse BID  . cyanocobalamin  1,000 mcg Intramuscular Once  . darbepoetin (ARANESP) injection - DIALYSIS  60 mcg Intravenous Q Wed-HD  . feeding supplement (NEPRO CARB STEADY)  237 mL Oral TID BM  . insulin aspart  0-15 Units Subcutaneous TID WC  . insulin aspart  0-5 Units Subcutaneous QHS  . insulin glargine  15 Units Subcutaneous Daily  . isosorbide mononitrate  60 mg Oral QHS  . levothyroxine  175 mcg Oral QAC breakfast  . memantine  5 mg Oral Daily  . multivitamin  1 tablet Oral QHS  . pantoprazole  40 mg Oral Q0600  . sevelamer carbonate  2.4 g Oral TID  . sodium chloride flush  3 mL Intravenous Q12H  . Warfarin - Pharmacist Dosing Inpatient   Does not apply q1800   Dg Chest Port 1 View  09/27/2015  CLINICAL DATA:  Dyspnea EXAM: PORTABLE CHEST 1 VIEW COMPARISON:  09/19/2015 FINDINGS: Cardiac shadow is enlarged but stable. Postsurgical changes are again noted. Chronic fibrotic changes are noted. Left basilar infiltrate is seen. Small associated left pleural effusion is seen. No bony abnormality is noted. IMPRESSION: Left basilar changes with associated effusion. Electronically Signed   By: Inez Catalina M.D.   On: 09/27/2015 03:10   BMET    Component Value Date/Time   NA 132*  09/28/2015 0600   K 4.5 09/28/2015 0600   CL 94* 09/28/2015 0600   CO2 29 09/28/2015 0600   GLUCOSE 173* 09/28/2015 0600   BUN 40* 09/28/2015 0600   CREATININE 4.38* 09/28/2015 0600   CALCIUM 8.0* 09/28/2015 0600   CALCIUM 8.1* 09/22/2015 0930   GFRNONAA 12* 09/28/2015 0600   GFRAA 13* 09/28/2015 0600   CBC    Component Value Date/Time   WBC 8.2 09/27/2015 0540   RBC 3.56* 09/27/2015 0540   HGB 9.6* 09/27/2015 0540   HCT 32.1* 09/27/2015 0540   PLT 155 09/27/2015 0540   MCV 90.2 09/27/2015 0540   MCH 27.0 09/27/2015 0540   MCHC 29.9* 09/27/2015 0540   RDW 16.6* 09/27/2015 0540   LYMPHSABS 0.3* 09/09/2015 1542   MONOABS 0.3 09/09/2015 1542   EOSABS 0.0 09/09/2015 1542   BASOSABS 0.0 09/09/2015 1542     Assessment: 1. ESRD  2. Anemia on aranesp 3. Sec HPTH  4. Paroxysmal A fib  Plan:  1. HD in AM if still here and outpt HD MWF otherwise HD thurs 2. Awaiting NHP as that will determine which HD unit he goes to.   Rhonda Vangieson T

## 2015-09-28 NOTE — Progress Notes (Signed)
09/28/2015 2:17 PM Hemodialysis Outpatient Note: This patient has been accepted at the [facility] on a Monday, Wednesday and Friday schedule. [facility] says they can transport to most of the centers in [place] and on either schedule. The center is working on a chair time for the patient. Patient can begin treatment on Friday April 7th. Thank you. Gordy Savers

## 2015-09-29 ENCOUNTER — Telehealth: Payer: Self-pay | Admitting: Interventional Cardiology

## 2015-09-29 DIAGNOSIS — I5032 Chronic diastolic (congestive) heart failure: Secondary | ICD-10-CM | POA: Diagnosis not present

## 2015-09-29 DIAGNOSIS — R488 Other symbolic dysfunctions: Secondary | ICD-10-CM | POA: Diagnosis not present

## 2015-09-29 DIAGNOSIS — I10 Essential (primary) hypertension: Secondary | ICD-10-CM | POA: Diagnosis not present

## 2015-09-29 DIAGNOSIS — I5042 Chronic combined systolic (congestive) and diastolic (congestive) heart failure: Secondary | ICD-10-CM | POA: Diagnosis not present

## 2015-09-29 DIAGNOSIS — D631 Anemia in chronic kidney disease: Secondary | ICD-10-CM | POA: Diagnosis not present

## 2015-09-29 DIAGNOSIS — F0390 Unspecified dementia without behavioral disturbance: Secondary | ICD-10-CM | POA: Diagnosis not present

## 2015-09-29 DIAGNOSIS — Z5189 Encounter for other specified aftercare: Secondary | ICD-10-CM | POA: Diagnosis not present

## 2015-09-29 DIAGNOSIS — I2510 Atherosclerotic heart disease of native coronary artery without angina pectoris: Secondary | ICD-10-CM | POA: Diagnosis not present

## 2015-09-29 DIAGNOSIS — I132 Hypertensive heart and chronic kidney disease with heart failure and with stage 5 chronic kidney disease, or end stage renal disease: Secondary | ICD-10-CM | POA: Diagnosis not present

## 2015-09-29 DIAGNOSIS — E1129 Type 2 diabetes mellitus with other diabetic kidney complication: Secondary | ICD-10-CM | POA: Diagnosis not present

## 2015-09-29 DIAGNOSIS — B351 Tinea unguium: Secondary | ICD-10-CM | POA: Diagnosis not present

## 2015-09-29 DIAGNOSIS — R1310 Dysphagia, unspecified: Secondary | ICD-10-CM | POA: Diagnosis not present

## 2015-09-29 DIAGNOSIS — D509 Iron deficiency anemia, unspecified: Secondary | ICD-10-CM | POA: Diagnosis not present

## 2015-09-29 DIAGNOSIS — I25810 Atherosclerosis of coronary artery bypass graft(s) without angina pectoris: Secondary | ICD-10-CM | POA: Diagnosis not present

## 2015-09-29 DIAGNOSIS — I480 Paroxysmal atrial fibrillation: Secondary | ICD-10-CM | POA: Diagnosis not present

## 2015-09-29 DIAGNOSIS — N186 End stage renal disease: Secondary | ICD-10-CM | POA: Diagnosis not present

## 2015-09-29 DIAGNOSIS — I739 Peripheral vascular disease, unspecified: Secondary | ICD-10-CM | POA: Diagnosis not present

## 2015-09-29 DIAGNOSIS — E871 Hypo-osmolality and hyponatremia: Secondary | ICD-10-CM | POA: Diagnosis not present

## 2015-09-29 DIAGNOSIS — I5033 Acute on chronic diastolic (congestive) heart failure: Secondary | ICD-10-CM | POA: Diagnosis not present

## 2015-09-29 DIAGNOSIS — J9611 Chronic respiratory failure with hypoxia: Secondary | ICD-10-CM | POA: Diagnosis not present

## 2015-09-29 DIAGNOSIS — E039 Hypothyroidism, unspecified: Secondary | ICD-10-CM | POA: Diagnosis not present

## 2015-09-29 DIAGNOSIS — E1122 Type 2 diabetes mellitus with diabetic chronic kidney disease: Secondary | ICD-10-CM | POA: Diagnosis not present

## 2015-09-29 DIAGNOSIS — E46 Unspecified protein-calorie malnutrition: Secondary | ICD-10-CM | POA: Diagnosis not present

## 2015-09-29 DIAGNOSIS — T82868D Thrombosis of vascular prosthetic devices, implants and grafts, subsequent encounter: Secondary | ICD-10-CM | POA: Diagnosis not present

## 2015-09-29 DIAGNOSIS — I871 Compression of vein: Secondary | ICD-10-CM | POA: Diagnosis not present

## 2015-09-29 DIAGNOSIS — Z7901 Long term (current) use of anticoagulants: Secondary | ICD-10-CM | POA: Diagnosis not present

## 2015-09-29 DIAGNOSIS — G3183 Dementia with Lewy bodies: Secondary | ICD-10-CM | POA: Diagnosis not present

## 2015-09-29 DIAGNOSIS — Z992 Dependence on renal dialysis: Secondary | ICD-10-CM | POA: Diagnosis not present

## 2015-09-29 DIAGNOSIS — L03114 Cellulitis of left upper limb: Secondary | ICD-10-CM | POA: Diagnosis not present

## 2015-09-29 DIAGNOSIS — E119 Type 2 diabetes mellitus without complications: Secondary | ICD-10-CM | POA: Diagnosis not present

## 2015-09-29 DIAGNOSIS — R531 Weakness: Secondary | ICD-10-CM | POA: Diagnosis not present

## 2015-09-29 DIAGNOSIS — R2681 Unsteadiness on feet: Secondary | ICD-10-CM | POA: Diagnosis not present

## 2015-09-29 DIAGNOSIS — E1151 Type 2 diabetes mellitus with diabetic peripheral angiopathy without gangrene: Secondary | ICD-10-CM | POA: Diagnosis not present

## 2015-09-29 DIAGNOSIS — K219 Gastro-esophageal reflux disease without esophagitis: Secondary | ICD-10-CM | POA: Diagnosis not present

## 2015-09-29 DIAGNOSIS — R1312 Dysphagia, oropharyngeal phase: Secondary | ICD-10-CM | POA: Diagnosis not present

## 2015-09-29 DIAGNOSIS — I509 Heart failure, unspecified: Secondary | ICD-10-CM | POA: Diagnosis not present

## 2015-09-29 DIAGNOSIS — M79671 Pain in right foot: Secondary | ICD-10-CM | POA: Diagnosis not present

## 2015-09-29 DIAGNOSIS — M6281 Muscle weakness (generalized): Secondary | ICD-10-CM | POA: Diagnosis not present

## 2015-09-29 DIAGNOSIS — N179 Acute kidney failure, unspecified: Secondary | ICD-10-CM | POA: Diagnosis not present

## 2015-09-29 DIAGNOSIS — L8990 Pressure ulcer of unspecified site, unspecified stage: Secondary | ICD-10-CM | POA: Diagnosis not present

## 2015-09-29 DIAGNOSIS — E43 Unspecified severe protein-calorie malnutrition: Secondary | ICD-10-CM | POA: Diagnosis not present

## 2015-09-29 DIAGNOSIS — N185 Chronic kidney disease, stage 5: Secondary | ICD-10-CM | POA: Diagnosis not present

## 2015-09-29 DIAGNOSIS — E785 Hyperlipidemia, unspecified: Secondary | ICD-10-CM | POA: Diagnosis not present

## 2015-09-29 DIAGNOSIS — R0600 Dyspnea, unspecified: Secondary | ICD-10-CM | POA: Diagnosis not present

## 2015-09-29 DIAGNOSIS — R630 Anorexia: Secondary | ICD-10-CM | POA: Diagnosis not present

## 2015-09-29 LAB — BASIC METABOLIC PANEL
Anion gap: 11 (ref 5–15)
BUN: 56 mg/dL — AB (ref 4–21)
BUN: 56 mg/dL — AB (ref 6–20)
CO2: 26 mmol/L (ref 22–32)
CREATININE: 5 mg/dL — AB (ref 0.6–1.3)
CREATININE: 4.95 mg/dL — AB (ref 0.61–1.24)
Calcium: 8.1 mg/dL — ABNORMAL LOW (ref 8.9–10.3)
Chloride: 94 mmol/L — ABNORMAL LOW (ref 101–111)
GFR calc non Af Amer: 10 mL/min — ABNORMAL LOW (ref >60)
GFR, EST AFRICAN AMERICAN: 12 mL/min — AB (ref >60)
GLUCOSE: 155 mg/dL
Glucose, Bld: 155 mg/dL — ABNORMAL HIGH (ref 65–99)
Potassium: 4.6 mmol/L (ref 3.5–5.1)
SODIUM: 131 mmol/L — AB (ref 137–147)
SODIUM: 131 mmol/L — AB (ref 135–145)

## 2015-09-29 LAB — PROTIME-INR
INR: 2.01 — AB (ref 0.00–1.49)
PROTHROMBIN TIME: 22.7 s — AB (ref 11.6–15.2)

## 2015-09-29 LAB — CBC
HEMATOCRIT: 31 % — AB (ref 39.0–52.0)
Hemoglobin: 9.3 g/dL — ABNORMAL LOW (ref 13.0–17.0)
MCH: 27 pg (ref 26.0–34.0)
MCHC: 30 g/dL (ref 30.0–36.0)
MCV: 90.1 fL (ref 78.0–100.0)
Platelets: 157 10*3/uL (ref 150–400)
RBC: 3.44 MIL/uL — ABNORMAL LOW (ref 4.22–5.81)
RDW: 16.8 % — ABNORMAL HIGH (ref 11.5–15.5)
WBC: 6 10*3/uL (ref 4.0–10.5)

## 2015-09-29 LAB — GLUCOSE, CAPILLARY
GLUCOSE-CAPILLARY: 156 mg/dL — AB (ref 65–99)
GLUCOSE-CAPILLARY: 232 mg/dL — AB (ref 65–99)
Glucose-Capillary: 167 mg/dL — ABNORMAL HIGH (ref 65–99)

## 2015-09-29 LAB — CBC AND DIFFERENTIAL: WBC: 6 10*3/mL

## 2015-09-29 MED ORDER — WARFARIN SODIUM 2.5 MG PO TABS
2.5000 mg | ORAL_TABLET | Freq: Once | ORAL | Status: AC
  Administered 2015-09-29: 2.5 mg via ORAL
  Filled 2015-09-29: qty 1

## 2015-09-29 MED ORDER — ISOSORBIDE MONONITRATE ER 60 MG PO TB24: 60.0000 mg | ORAL_TABLET | Freq: Every day | ORAL | Status: DC

## 2015-09-29 MED ORDER — BENZONATATE 100 MG PO CAPS: 100.0000 mg | ORAL_CAPSULE | Freq: Two times a day (BID) | ORAL | Status: DC

## 2015-09-29 MED ORDER — AMOXICILLIN 500 MG PO CAPS: 500.0000 mg | ORAL_CAPSULE | Freq: Every day | ORAL | Status: AC

## 2015-09-29 MED ORDER — SEVELAMER CARBONATE 2.4 G PO PACK: 2.4000 g | PACK | Freq: Three times a day (TID) | ORAL | Status: DC

## 2015-09-29 MED ORDER — PANTOPRAZOLE SODIUM 40 MG PO TBEC: 40.0000 mg | DELAYED_RELEASE_TABLET | Freq: Every day | ORAL | Status: DC

## 2015-09-29 MED ORDER — WARFARIN SODIUM 5 MG PO TABS: ORAL_TABLET | ORAL | Status: DC

## 2015-09-29 MED ORDER — HEPARIN SODIUM (PORCINE) 1000 UNIT/ML DIALYSIS: 1000.0000 [IU] | INTRAMUSCULAR | Status: DC | PRN

## 2015-09-29 MED ORDER — RENA-VITE PO TABS: 1.0000 | ORAL_TABLET | Freq: Every day | ORAL | Status: DC

## 2015-09-29 MED ORDER — DARBEPOETIN ALFA 60 MCG/0.3ML IJ SOSY
60.0000 ug | PREFILLED_SYRINGE | INTRAMUSCULAR | Status: DC
  Administered 2015-09-29: 60 ug via INTRAVENOUS
  Filled 2015-09-29: qty 0.3

## 2015-09-29 MED ORDER — DARBEPOETIN ALFA 60 MCG/0.3ML IJ SOSY
PREFILLED_SYRINGE | INTRAMUSCULAR | Status: AC
  Filled 2015-09-29: qty 0.3

## 2015-09-29 MED ORDER — ONDANSETRON HCL 4 MG/2ML IJ SOLN: 4.0000 mg | Freq: Four times a day (QID) | INTRAMUSCULAR | Status: DC | PRN

## 2015-09-29 MED ORDER — ACETAMINOPHEN 325 MG PO TABS: 650.0000 mg | ORAL_TABLET | ORAL | Status: AC | PRN

## 2015-09-29 MED ORDER — ALBUTEROL SULFATE (2.5 MG/3ML) 0.083% IN NEBU: 2.5000 mg | INHALATION_SOLUTION | Freq: Four times a day (QID) | RESPIRATORY_TRACT | Status: DC | PRN

## 2015-09-29 MED ORDER — RESOURCE THICKENUP CLEAR PO POWD: 1.0000 | ORAL | Status: DC | PRN

## 2015-09-29 MED ORDER — LIDOCAINE HCL (PF) 1 % IJ SOLN: 5.0000 mL | INTRAMUSCULAR | Status: DC | PRN

## 2015-09-29 MED ORDER — GUAIFENESIN 100 MG/5ML PO SYRP: 200.0000 mg | ORAL_SOLUTION | Freq: Four times a day (QID) | ORAL | Status: DC | PRN

## 2015-09-29 NOTE — Consult Note (Signed)
   [facility] CM Inpatient Consult   09/29/2015  [name] [birth year] [id number] Patient with a long length of stay - day 20.  Patient and family continues for a skilled nursing post hospital stay.    Met and his wife with the patient regarding the benefits of [facility] Care Management services.  Explained that [place] [facility] is a covered benefit of insurance. Review information for [facility] Care Management and a folder was provided with contact information.  Explained that [place] [facility] does not interfere with or replace any services arranged by the inpatient care management staff.  Wife states she has received information at home on  services with [place] [facility].  She is not sure what he will need when he returns home after the skilled facility rehab.  Requested brochure and contact information.  Encouraged her to call if she feels she needs some assistance when he is returning home from the facility.  She states she would.   For questions, please contact: Natividad Brood, RN BSN [facility] Liaison  [phone] business mobile phone Toll free office [phone]

## 2015-09-29 NOTE — Progress Notes (Signed)
ANTICOAGULATION Consult Note - Follow up consult  Pharmacy Consult for Coumadin  Indication: atrial fibrillation  Patient Measurements: Height: 5\' 10"  (177.8 cm) Weight: 180 lb 3.2 oz (81.738 kg) (scale c) IBW/kg (Calculated) : 73  Vital Signs: Temp: 98.1 F (36.7 C) (04/05 0441) Temp Source: Oral (04/05 0441) BP: 151/43 mmHg (04/05 0441) Pulse Rate: 51 (04/05 0441)  Labs:  Recent Labs  09/27/15 0540 09/28/15 0600 09/29/15 0311  HGB 9.6*  --   --   HCT 32.1*  --   --   PLT 155  --   --   LABPROT 23.9* 22.9* 22.7*  INR 2.16* 2.03* 2.01*  CREATININE 6.10* 4.38* 4.95*    Estimated Creatinine Clearance: 12.5 mL/min (by C-G formula based on Cr of 4.95).   Assessment: 71 yom admitted 09/09/2015 with SOB. Patient on coumadin PTA for AFib. Pharmacy consulted to continue dosing inpatient.   INR now therapeutic. LFTs only mildly elevated. Patient is s/p bactrim x1 day that may also impact INR. On amiodarone PTA which has been stopped due to bradycardia. May need home dose increased as amiodarone clears.  PTA coumadin dose = 2.5mg  Mon/Tue/Wed/Sat and 5mg  Sun/Thurs  Goal of Therapy:  INR 2-3 Monitor platelets by anticoagulation protocol: Yes   Plan:  1) Coumadin 2.5 mg PO x1 2) Daily INR  Uvaldo Rising, BCPS  Clinical Pharmacist Pager [phone]  09/29/2015 8:09 AM

## 2015-09-29 NOTE — Clinical Social Work Note (Signed)
Clinical Social Worker facilitated patient discharge including contacting patient family and facility to confirm patient discharge plans.  Clinical information faxed to facility and family agreeable with plan.  CSW arranged ambulance transport via [facility].  RN to call report prior to discharge.  Clinical Social Worker will sign off for now as social work intervention is no longer needed. Please consult [place] again if new need arises.  Glendon Axe, MSW, LCSWA [phone] 09/29/2015 2:38 PM

## 2015-09-29 NOTE — Telephone Encounter (Signed)
Social worker Designer, fashion/clothing) calling wanting to know if the patient had dialysis today.  Advised would not know status of his dialysis.  Advised her to call Darrick Grinder, NP in Heart Failure who wrote the d/c summary.  Gave her the phone number to heart failure clinic.

## 2015-09-29 NOTE — Progress Notes (Signed)
S: No new CO.  Has been up walking some O:BP 151/43 mmHg  Pulse 51  Temp(Src) 98.1 F (36.7 C) (Oral)  Resp 14  Ht 5\' 10"  (1.778 m)  Wt 81.738 kg (180 lb 3.2 oz)  BMI 25.86 kg/m2  SpO2 94%  Intake/Output Summary (Last 24 hours) at 09/29/15 1057 Last data filed at 09/29/15 0441  Gross per 24 hour  Intake    510 ml  Output    450 ml  Net     60 ml   Weight change: -0.907 kg (-2 lb) IN:[id number] and alert CVS: Loletha Grayer, reg Resp: scattered rhonchi Abd:+ BS NTND Ext: No edema   LUA AVF + bruit NEURO:CNI Ox3 no asterixis    . amoxicillin  500 mg Oral QHS  . antiseptic oral rinse  7 mL Mouth Rinse q12n4p  . atorvastatin  80 mg Oral q1800  . benzonatate  100 mg Oral BID  . chlorhexidine  15 mL Mouth Rinse BID  . cyanocobalamin  1,000 mcg Intramuscular Once  . darbepoetin (ARANESP) injection - DIALYSIS  60 mcg Intravenous Q Wed-HD  . feeding supplement (NEPRO CARB STEADY)  237 mL Oral TID BM  . insulin aspart  0-15 Units Subcutaneous TID WC  . insulin aspart  0-5 Units Subcutaneous QHS  . insulin glargine  15 Units Subcutaneous Daily  . isosorbide mononitrate  60 mg Oral QHS  . levothyroxine  175 mcg Oral QAC breakfast  . memantine  5 mg Oral Daily  . multivitamin  1 tablet Oral QHS  . pantoprazole  40 mg Oral Q0600  . sevelamer carbonate  2.4 g Oral TID  . sodium chloride flush  3 mL Intravenous Q12H  . Warfarin - Pharmacist Dosing Inpatient   Does not apply q1800   No results found. BMET    Component Value Date/Time   NA 131* 09/29/2015 0311   K 4.6 09/29/2015 0311   CL 94* 09/29/2015 0311   CO2 26 09/29/2015 0311   GLUCOSE 155* 09/29/2015 0311   BUN 56* 09/29/2015 0311   CREATININE 4.95* 09/29/2015 0311   CALCIUM 8.1* 09/29/2015 0311   CALCIUM 8.1* 09/22/2015 0930   GFRNONAA 10* 09/29/2015 0311   GFRAA 12* 09/29/2015 0311   CBC    Component Value Date/Time   WBC 8.2 09/27/2015 0540   RBC 3.56* 09/27/2015 0540   HGB 9.6* 09/27/2015 0540   HCT 32.1* 09/27/2015  0540   PLT 155 09/27/2015 0540   MCV 90.2 09/27/2015 0540   MCH 27.0 09/27/2015 0540   MCHC 29.9* 09/27/2015 0540   RDW 16.6* 09/27/2015 0540   LYMPHSABS 0.3* 09/09/2015 1542   MONOABS 0.3 09/09/2015 1542   EOSABS 0.0 09/09/2015 1542   BASOSABS 0.0 09/09/2015 1542     Assessment: 1. ESRD  2. Anemia on aranesp 3. Sec HPTH  4. Paroxysmal A fib  Plan:  1. HD today 2. Has place at [facility] and trying to arrange outpt HD       Vali Capano T

## 2015-09-29 NOTE — Clinical Social Work Note (Signed)
Patient currently still in HD. To transition to SNF, [place] once arriving back on the floor.   RN to arrange [facility] transportation- number provided.   Clinical Social Worker will sign off for now as social work intervention is no longer needed. Please consult [place] again if new need arises.  Glendon Axe, MSW, [place] [phone] 09/29/2015 5:40 PM

## 2015-09-29 NOTE — Clinical Social Work Placement (Signed)
   CLINICAL SOCIAL WORK PLACEMENT  NOTE  Date:  09/29/2015  Patient Details  Name: [name] MRN: [id number] Date of Birth: [birth year]  Clinical Social Work is seeking post-discharge placement for this patient at the [place] level of care (*CSW will initial, date and re-position this form in  chart as items are completed):  Yes   Patient/family provided with [place] Work Department's list of facilities offering this level of care within the geographic area requested by the patient (or if unable, by the patient's family).  Yes   Patient/family informed of their freedom to choose among providers that offer the needed level of care, that participate in Medicare, Medicaid or managed care program needed by the patient, have an available bed and are willing to accept the patient.      Patient/family informed of [facility]'s ownership interest in [facility] and [facility], as well as of the fact that they are under no obligation to receive care at these facilities.  PASRR submitted to EDS on 09/17/15     PASRR number received on 09/17/15     Existing PASRR number confirmed on       FL2 transmitted to all facilities in geographic area requested by pt/family on 09/17/15     FL2 transmitted to all facilities within larger geographic area on 09/17/15     Patient informed that his/her managed care company has contracts with or will negotiate with certain facilities, including the following:        Yes   Patient/family informed of bed offers received.  Patient chooses bed at  [facility])     Physician recommends and patient chooses bed at      Patient to be transferred to  [facility]) on 09/29/15.  Patient to be transferred to facility by  [facility])     Patient family notified on 09/29/15 of transfer.  Name of family member notified:   (Pt's wife, [name])     PHYSICIAN Please prepare priority discharge summary, including  medications, Please prepare prescriptions, Please sign DNR     Additional Comment:    _______________________________________________ Rozell Searing, LCSW 09/29/2015, 10:38 AM

## 2015-09-29 NOTE — Progress Notes (Signed)
Patient ID: [name], male   DOB: Aug 28, 1933, 82 y.o.   MRN: [id number]    Advanced Heart Failure Rounding Note   Subjective:    Started HD 3/25. Seems to be tolerating for now. 09/19/15 he was transferred to ICU due to acute respiratory distress. Placed on Bipap, improved with ongoing HD for fluid removal. Stabilized and now back on telemetry.  Had choking episode on cough syrup 4/3. Speech therapy saw and recommends Dysphagia 3 diet.   Tolerating HD fine. Needs SNF placement and then decision on Dialysis site will be made.    Overall feeling ok. Denies SOB.    INR 2.01.   09/10/2015: EF 60-65% MV severely calcified. Grade II DD. Moderate AS    Objective:   Weight Range:  Vital Signs:   Temp:  [97.9 F (36.6 C)-98.3 F (36.8 C)] 98.1 F (36.7 C) (04/05 0441) Pulse Rate:  [51-55] 51 (04/05 0441) Resp:  [14-18] 14 (04/05 0441) BP: (146-161)/(43-60) 151/43 mmHg (04/05 0441) SpO2:  [94 %-98 %] 94 % (04/05 0441) Weight:  [180 lb 3.2 oz (81.738 kg)] 180 lb 3.2 oz (81.738 kg) (04/05 0441) Last BM Date: 09/23/15  Weight change: Filed Weights   09/27/15 0700 09/27/15 0818 09/29/15 0441  Weight: 182 lb 3.2 oz (82.645 kg) 182 lb 3.2 oz (82.645 kg) 180 lb 3.2 oz (81.738 kg)    Intake/Output:   Intake/Output Summary (Last 24 hours) at 09/29/15 0803 Last data filed at 09/29/15 0441  Gross per 24 hour  Intake    750 ml  Output    450 ml  Net    300 ml     Physical Exam: General:  Chronically ill appearing and Elderly appearing. NAD. In bed. Wife at bedside.  HEENT: normal, [place] in place Neck: supple. JVP 8-9 cm.  Carotids 2+ bilat; no bruits. No thyromegaly or nodule noted.  Cor: PMI nondisplaced. RRR. No rubs, gallops. 3/6 HSM RUSB.  Lungs: Scattered rhonchi that clear with cough, on 02 via [place]. Abdomen: soft, NT, ND, no HSM. No bruits or masses. +BS  Extremities: no cyanosis, clubbing, rash.  R and LLE SCDs. R and LLE trace ankle edema. LUE AVF  Neuro: alert &  orientedx3, cranial nerves grossly intact. moves all 4 extremities w/o difficulty. Affect pleasant GU: Condom Cath   Telemetry: Reviewed, Sinus Brady 50-60s   Labs: Basic Metabolic Panel:  Recent Labs Lab 09/24/15 0702 09/25/15 0410 09/26/15 0418 09/27/15 0540 09/28/15 0600 09/29/15 0311  NA 134* 135 130* 127* 132* 131*  K 4.1 3.8 4.2 4.9 4.5 4.6  CL 98* 98* 94* 90* 94* 94*  CO2 23 29 28 28 29 26   GLUCOSE 161* 169* 211* 189* 173* 155*  BUN 58* 25* 40* 57* 40* 56*  CREATININE 5.92* 3.78* 5.04* 6.10* 4.38* 4.95*  CALCIUM 8.4* 8.0* 8.1* 8.1* 8.0* 8.1*  PHOS 6.4*  --   --   --   --   --     Liver Function Tests:  Recent Labs Lab 09/24/15 0702  ALBUMIN 2.4*   No results for input(s): LIPASE, AMYLASE in the last 168 hours. No results for input(s): AMMONIA in the last 168 hours.  CBC:  Recent Labs Lab 09/22/15 0930 09/24/15 0505 09/24/15 0702 09/27/15 0540  WBC 9.5 7.2 7.5 8.2  HGB 10.0* 10.2* 10.3* 9.6*  HCT 31.8* 32.8* 33.0* 32.1*  MCV 89.3 89.6 89.9 90.2  PLT 139* 140* 129* 155    Cardiac Enzymes: No results for input(s): CKTOTAL, CKMB,  CKMBINDEX, TROPONINI in the last 168 hours.  BNP: BNP (last 3 results)  Recent Labs  09/09/15 1326 09/09/15 1542  BNP 605.6* 729.6*    ProBNP (last 3 results) No results for input(s): PROBNP in the last 8760 hours.    Other results:  Imaging: No results found.   Medications:     Scheduled Medications: . amoxicillin  500 mg Oral QHS  . antiseptic oral rinse  7 mL Mouth Rinse q12n4p  . atorvastatin  80 mg Oral q1800  . benzonatate  100 mg Oral BID  . chlorhexidine  15 mL Mouth Rinse BID  . cyanocobalamin  1,000 mcg Intramuscular Once  . darbepoetin (ARANESP) injection - DIALYSIS  60 mcg Intravenous Q Wed-HD  . feeding supplement (NEPRO CARB STEADY)  237 mL Oral TID BM  . insulin aspart  0-15 Units Subcutaneous TID WC  . insulin aspart  0-5 Units Subcutaneous QHS  . insulin glargine  15 Units  Subcutaneous Daily  . isosorbide mononitrate  60 mg Oral QHS  . levothyroxine  175 mcg Oral QAC breakfast  . memantine  5 mg Oral Daily  . multivitamin  1 tablet Oral QHS  . pantoprazole  40 mg Oral Q0600  . sevelamer carbonate  2.4 g Oral TID  . sodium chloride flush  3 mL Intravenous Q12H  . Warfarin - Pharmacist Dosing Inpatient   Does not apply q1800    Infusions:    PRN Medications: sodium chloride, sodium chloride, sodium chloride, acetaminophen, albuterol, alteplase, guaifenesin, heparin, lidocaine (PF), lidocaine-prilocaine, ondansetron (ZOFRAN) IV, pentafluoroprop-tetrafluoroeth, RESOURCE THICKENUP CLEAR, sodium chloride flush   Assessment/ Plan /Discussion.    1. Acute on chronic diastolic CHF: EF 123456, moderate diastolic dysfunction.Initially diuresed with lasix drip + metolazone but due to poor response and worsening renal function he started on HD. Volume status improved with HD. Overall 31 pounds removed.  2. ESRD: Started HD 3/25.  - Tolerating HD for now.  3. Atrial fibrillation: Paroxysmal, in NSR. No beta blocker with bradycardia. - INR 2.0. Pharmacy to dose Warfarin today. Plan to give coumadin 2.5 mg daily except 5 mg on Sunday and Thursday. Check INR Friday. SNF will manage INR once discharged.  4. CAD: s/p CABG, stable. Continue statin.  5. AS: Moderate on echo.   6. Mitral stenosis: Mild on echo.  7. Hypothyroidism: TSH 1.5 . Continue 175 mcg synthroid. 8. HTN: BP stable off antihypertensives now that he is getting HD.  9. Anemia: Iron stores low. Got Fe this admission.  10. Dementia 11. Deconditioning: PT following--> Plan for SNF 12.  Poor PO intake: Dietician input appreciated.   13. UTI: Klebsiella and E coli in urine.  Also with Staph in sputum.   - On amoxicillin.   He will completed course on 10/01/15  14. Acute/Chronic Respiratory Failure - Transferred to ICU on NRB on 3./26.  - Stable on Nasal Cannula.  15. Dysphagia - Dysphagia 3  diet. 16. DM- Continue lantus 15 units in am. Continue novolog sliding scale at SNF.  17. Disposition: Possible D/C today if outpatient HD set up. Anticipate D/C to [facility] once outpatient HD set up.     Will follow with palliative care as outpatient to help determine if he will continue HD long-term. If not will need Hospice.      Amy Clegg NP-C 09/29/2015 8:03 AM  Advanced Heart Failure Team Pager [phone] (M-F; 7a - 4p)  Please contact [facility] for night-coverage after hours (4p -7a ) and weekends  on [email or web address]  Patient seen with PA, agree with the above note. He is stable today. Tolerating HD. There may be a SNF bed available today. I think it would be ok if her were to go to SNF today after HD as long as he has outpatient HD arranged.   Loralie Champagne 09/29/2015 8:44 AM

## 2015-09-29 NOTE — Telephone Encounter (Signed)
New Message   Pt has been discharged was supposed to have dialysis today. Request a call back to discuss if the pt was truly discharged without dialysis.

## 2015-09-29 NOTE — Discharge Summary (Signed)
Advanced Heart Failure Team  Discharge Summary   Patient ID: [name] MRN: [id number], DOB/AGE: 11/12/1933 82 y.o. Admit date: 09/09/2015 D/C date:     09/29/2015   Primary Discharge Diagnoses:   1. Acute on chronic diastolic CHF: EF 123456, moderate diastolic dysfunction.  2. ESRD: Started HD 3/25.  - Tolerating HD for now. He will continue M-W-F and [facility].  3. Atrial fibrillation: Paroxysmal, in NSR. No beta blocker with bradycardia. - INR 2.0.  4. CAD: s/p CABG, stable. Continue statin.  5. AS: Moderate on echo.   6. Mitral stenosis: Mild on echo.  7. Hypothyroidism  Continue 175 mcg synthroid. 8. HTN.  9. Anemia 10. Dementia 11. Deconditioning 12. Poor PO intake  13. UTI  14. Acute/Chronic Respiratory Failure  15. Dysphagia 16. DM 17. DNR   Hospital Course:  [name] is a 82 year old with history of CAD- CABG, bradycardia (off bb) , CKD Stage IV, hypothyroidism, A fib DC-CV 2015 on amio, has LUE AVF. Most recent cath 2008 LAD, CFX 100%, SVG-OM 90%, RCA 70%.   Admitted with volume overload. Prior to admit he lasix had been increased to lasix 80 mg po twice a day. Weight at home did not change 216 pounds. He has been diuresing with 120 mg IV lasix twice a day then cut back to 60 mg twice today. Creatinine on admit 3.29 and in July 2016 creatinine was 2.45.    1. Acute on chronic diastolic CHF: EF 123456, moderate diastolic dysfunction.Initially diuresed with lasix drip + metolazone but due to poor response and worsening renal function . Nephrology was consulted and he started on HD. Volume status improved with HD. Overall 31 pounds removed.  2. ESRD: Started HD 3/25.  - Tolerating HD for now. He will continue HD M-W-F at the [facility].  - creatinine on the day discharge was 4.95. Still making some urine. Ok to use condom Catheter.  3. Atrial fibrillation: Paroxysmal, in NSR. No beta blocker with bradycardia. -  INR 2.01. Pharmacy to dose Warfarin today. Plan to give coumadin 2.5 mg daily except 5 mg on Sunday and Thursday. Check INR Friday. SNF will manage INR once discharged.  4. CAD: s/p CABG, stable. Continue statin.  5. AS: Moderate on echo.   6. Mitral stenosis: Mild on echo.  7. Hypothyroidism: TSH 1.5 . Continue 175 mcg synthroid. 8. HTN: BP stable off antihypertensives now that he is getting HD.  9. Anemia: Iron stores low. Got Fe this admission.  10. Dementia- Continue namenda.  11. Deconditioning: PT following--> Plan for SNF 12. Poor PO intake: Dietician input appreciated.  13. UTI: Klebsiella and E coli in urine. Also with Staph in sputum.  - Treated with antibiotics. On amoxicillin. He will complete amoxicillin  on 10/01/15  14. Acute/Chronic Respiratory Failure - Transferred to ICU on NRB on 09/19/15 As he improved he was weaned to 2 liters nasal cannula. Continue oxygen as needed to maintain O2 sat > 88%.  15. Dysphagia- Speech Therapist Consulted with recommendations for dysphagia 3 diet. 16. DM- Continue lantus 15 units in am. Continue novolog sliding scale at SNF ac and hs. 17. DNR- Transitioned to DNR after family meeting with Palliative Care on 09/23/2015.   Plan to discharge to [facility] today in stable condition. He will also be followed by Palliative in the event he doesn't tolerate HD will need Hospice.    Consults:  Nephrology Palliative Care Speech Therapy Physical Therapy  Discharge Weight : 180 pounds.  Discharge Vitals: Blood pressure 151/45, pulse 53, temperature 98.2 F (36.8 C), temperature source Oral, resp. rate 16, height 5\' 10"  (1.778 m), weight 180 lb 3.2 oz (81.738 kg), SpO2 97 %.  Labs: Lab Results  Component Value Date   WBC 8.2 09/27/2015   HGB 9.6* 09/27/2015   HCT 32.1* 09/27/2015   MCV 90.2 09/27/2015   PLT 155 09/27/2015    Recent Labs Lab 09/29/15 0311  NA 131*  K 4.6  CL 94*  CO2 26  BUN 56*  CREATININE  4.95*  CALCIUM 8.1*  GLUCOSE 155*   Lab Results  Component Value Date   CHOL 143 02/13/2014   HDL 33.00* 02/13/2014   LDLCALC 87 02/13/2014   TRIG 114.0 02/13/2014   BNP (last 3 results)  Recent Labs  09/09/15 1326 09/09/15 1542  BNP 605.6* 729.6*    ProBNP (last 3 results) No results for input(s): PROBNP in the last 8760 hours.   Diagnostic Studies/Procedures   No results found.  Discharge Medications     Medication List    STOP taking these medications        amiodarone 200 MG tablet  Commonly known as:  PACERONE     amLODipine 10 MG tablet  Commonly known as:  NORVASC     doxazosin 4 MG tablet  Commonly known as:  CARDURA     furosemide 80 MG tablet  Commonly known as:  LASIX     KLOR-CON M20 20 MEQ tablet  Generic drug:  potassium chloride SA     multivitamin with minerals Tabs tablet     nitroGLYCERIN 0.4 MG SL tablet  Commonly known as:  NITROSTAT     NOVOLOG FLEXPEN 100 UNIT/ML FlexPen  Generic drug:  insulin aspart     ranitidine 300 MG tablet  Commonly known as:  ZANTAC     silver sulfADIAZINE 1 % cream  Commonly known as:  SILVADENE     trolamine salicylate 10 % cream  Commonly known as:  ASPERCREME      TAKE these medications        acetaminophen 325 MG tablet  Commonly known as:  TYLENOL  Take 2 tablets (650 mg total) by mouth every 4 (four) hours as needed for headache or mild pain.     albuterol (2.5 MG/3ML) 0.083% nebulizer solution  Commonly known as:  PROVENTIL  Take 3 mLs (2.5 mg total) by nebulization every 6 (six) hours as needed for wheezing or shortness of breath.     amoxicillin 500 MG capsule  Commonly known as:  AMOXIL  Take 1 capsule (500 mg total) by mouth at bedtime.     atorvastatin 80 MG tablet  Commonly known as:  LIPITOR  TAKE ONE TABLET BY MOUTH ONCE DAILY     benzonatate 100 MG capsule  Commonly known as:  TESSALON  Take 1 capsule (100 mg total) by mouth 2 (two) times daily.     cholecalciferol  1000 units tablet  Commonly known as:  VITAMIN D  Take 1,000 Units by mouth daily at 6 PM.     guaifenesin 100 MG/5ML syrup  Commonly known as:  ROBITUSSIN  Take 10 mLs (200 mg total) by mouth every 6 (six) hours as needed for cough or congestion.     heparin 1000 unit/mL Soln injection  1 mL (1,000 Units total) by Dialysis route as needed (in dialysis).     isosorbide mononitrate 60 MG 24 hr tablet  Commonly known as:  IMDUR  Take 1 tablet (60 mg total) by mouth at bedtime.     LANTUS SOLOSTAR 100 UNIT/ML Solostar Pen  Generic drug:  Insulin Glargine  Inject 15 Units into the skin every morning.     levothyroxine 175 MCG tablet  Commonly known as:  SYNTHROID, LEVOTHROID  Take 175 mcg by mouth daily before breakfast.     lidocaine (PF) 1 % Soln injection  Commonly known as:  XYLOCAINE  Inject 5 mLs into the skin as needed (topical anesthesia for hemodialysis ifGEBAUERS is ineffective.).     memantine 5 MG tablet  Commonly known as:  NAMENDA  Take 5 mg by mouth daily.     multivitamin Tabs tablet  Take 1 tablet by mouth at bedtime.     ondansetron 4 MG/2ML Soln injection  Commonly known as:  ZOFRAN  Inject 2 mLs (4 mg total) into the vein every 6 (six) hours as needed for nausea.     pantoprazole 40 MG tablet  Commonly known as:  PROTONIX  Take 1 tablet (40 mg total) by mouth daily at 6 (six) AM.     polyethylene glycol packet  Commonly known as:  MIRALAX / GLYCOLAX  Take 17 g by mouth daily as needed for moderate constipation.     RESOURCE THICKENUP CLEAR Powd  Take 120 g by mouth as needed.     sevelamer carbonate 2.4 g Pack  Commonly known as:  RENVELA  Take 2.4 g by mouth 3 (three) times daily.     warfarin 5 MG tablet  Commonly known as:  COUMADIN  Take 2.5 mg daily except 5 mg on Sunday and Thursday        Disposition   The patient will be discharged in stable condition to [place], [employer] [facility].      Discharge Instructions     Diet - low sodium heart healthy    Complete by:  As directed      Heart Failure patients record your daily weight using the same scale at the same time of day    Complete by:  As directed      Increase activity slowly    Complete by:  As directed           Follow-up Information    Follow up with [facility] On 11/01/2015.   Why:  at 1030 for post hospital follow up. Please bring all of your medications or MAR to your visit.    Contact information:   [place] [phone]      Follow up with [facility] .   Specialty:  Skilled Nursing Facility   Contact information:   [place] [phone]      Follow up with Sinclair Grooms, MD On 11/01/2015.   Specialty:  Cardiology   Why:  1000   Contact information:   [id number] [address] [phone]         Duration of Discharge Encounter: Greater than 35 minutes   Signed, Darrick Grinder NP-C  09/29/2015, 2:14 PM

## 2015-09-29 NOTE — Clinical Social Work Note (Addendum)
Patient has bed at SNF, [facility] once medically stable for d/c. Outpatient HD arranged at [facility].   CSW remains available as needed.  Glendon Axe, MSW, LCSWA [phone] 09/29/2015 11:48 AM

## 2015-09-30 ENCOUNTER — Encounter: Payer: Self-pay | Admitting: Adult Health

## 2015-09-30 ENCOUNTER — Non-Acute Institutional Stay (SKILLED_NURSING_FACILITY): Payer: Medicare Other | Admitting: Adult Health

## 2015-09-30 DIAGNOSIS — E43 Unspecified severe protein-calorie malnutrition: Secondary | ICD-10-CM | POA: Diagnosis not present

## 2015-09-30 DIAGNOSIS — K5901 Slow transit constipation: Secondary | ICD-10-CM

## 2015-09-30 DIAGNOSIS — I480 Paroxysmal atrial fibrillation: Secondary | ICD-10-CM | POA: Diagnosis not present

## 2015-09-30 DIAGNOSIS — F0280 Dementia in other diseases classified elsewhere without behavioral disturbance: Secondary | ICD-10-CM

## 2015-09-30 DIAGNOSIS — N390 Urinary tract infection, site not specified: Secondary | ICD-10-CM

## 2015-09-30 DIAGNOSIS — Z7901 Long term (current) use of anticoagulants: Secondary | ICD-10-CM | POA: Diagnosis not present

## 2015-09-30 DIAGNOSIS — E1151 Type 2 diabetes mellitus with diabetic peripheral angiopathy without gangrene: Secondary | ICD-10-CM | POA: Diagnosis not present

## 2015-09-30 DIAGNOSIS — I25810 Atherosclerosis of coronary artery bypass graft(s) without angina pectoris: Secondary | ICD-10-CM

## 2015-09-30 DIAGNOSIS — K219 Gastro-esophageal reflux disease without esophagitis: Secondary | ICD-10-CM

## 2015-09-30 DIAGNOSIS — I10 Essential (primary) hypertension: Secondary | ICD-10-CM | POA: Diagnosis not present

## 2015-09-30 DIAGNOSIS — R630 Anorexia: Secondary | ICD-10-CM

## 2015-09-30 DIAGNOSIS — L8990 Pressure ulcer of unspecified site, unspecified stage: Secondary | ICD-10-CM

## 2015-09-30 DIAGNOSIS — R1310 Dysphagia, unspecified: Secondary | ICD-10-CM

## 2015-09-30 DIAGNOSIS — R531 Weakness: Secondary | ICD-10-CM

## 2015-09-30 DIAGNOSIS — I5033 Acute on chronic diastolic (congestive) heart failure: Secondary | ICD-10-CM

## 2015-09-30 DIAGNOSIS — N186 End stage renal disease: Secondary | ICD-10-CM | POA: Diagnosis not present

## 2015-09-30 DIAGNOSIS — G3183 Dementia with Lewy bodies: Secondary | ICD-10-CM | POA: Diagnosis not present

## 2015-09-30 DIAGNOSIS — D509 Iron deficiency anemia, unspecified: Secondary | ICD-10-CM

## 2015-09-30 DIAGNOSIS — J9610 Chronic respiratory failure, unspecified whether with hypoxia or hypercapnia: Secondary | ICD-10-CM

## 2015-09-30 DIAGNOSIS — B001 Herpesviral vesicular dermatitis: Secondary | ICD-10-CM

## 2015-09-30 DIAGNOSIS — Z992 Dependence on renal dialysis: Secondary | ICD-10-CM

## 2015-09-30 DIAGNOSIS — E039 Hypothyroidism, unspecified: Secondary | ICD-10-CM

## 2015-09-30 NOTE — Progress Notes (Signed)
Patient ID: [name], male   DOB: 1934/04/26, 82 y.o.   MRN: [id number]    DATE:  09/30/2015   MRN:  [id number]  BIRTHDAY: 12-10-1933  Facility:  [facility] Home Location:  [place] [facility] [place] Room Number: 902-P  LEVEL OF CARE:  SNF (31)  Contact Information    Name Relation Home Work [place] Spouse [phone]  [phone]   [place], [place]   [phone]       Code Status History    Date Active Date Inactive Code Status Order ID Comments User Context   09/23/2015  1:26 PM 09/29/2015 11:10 PM DNR [id number]  Micheline Rough, MD Inpatient   09/09/2015  3:29 PM 09/23/2015  1:26 PM Full Code [id number]  Belva Crome, MD ED   03/24/2014 12:41 AM 03/24/2014  6:34 PM Full Code [id number]  Shanda Howells, MD ED   03/04/2014  4:32 PM 03/07/2014  3:54 PM Full Code [id number]  Lonn Georgia, PA-C ED    Questions for Most Recent Historical Code Status (Order [id number])    Question Answer Comment   In the event of cardiac or respiratory ARREST Do not call a "code blue"    In the event of cardiac or respiratory ARREST Do not perform Intubation, CPR, defibrillation or ACLS    In the event of cardiac or respiratory ARREST Use medication by any route, position, wound care, and other measures to relive pain and suffering. May use oxygen, suction and manual treatment of airway obstruction as needed for comfort.     Advance Directive Documentation        Most Recent Value   Type of Advance Directive  Out of facility DNR (pink MOST or yellow form)   Pre-existing out of facility DNR order (yellow form or pink MOST form)     "MOST" Form in Place?         Chief Complaint  Patient presents with  . Hospitalization Follow-up    HISTORY OF PRESENT ILLNESS:   This is a 82 year old male who has been admitted to [facility] on 09/29/15 from [facility] He hasPMH of CAD S/P CABG, bradycardia, CKD stage IV, hypothyroidism and atrial fibrillation. He was having  progressive cough, dyspnea and BLE edema with R>L. He was found to have chronic combined systolic and diastolic CHF.   Initially was diuresed with lasix drip + metolazone but due to poor response and worsening renal function,  Nephrology was consulted. He started on HD. Volume status improved with HD. Overall 31 pounds removed. He had UTI discharged with Amoxicillin 500 mg Q HS and will complete on 10/01/15. He will follow-up with Palliative Care.  He has been admitted for a short-term rehabilitation.  PAST MEDICAL HISTORY:  Past Medical History  Diagnosis Date  . Hypertension   . Hyperlipidemia   . Hypothyroidism   . CAD (coronary artery disease)     a. prior CABG in 1997 with redo in 2000. b. last cath in 2008 - managed medically  . Type 2 diabetes mellitus ([place])   . Generalized weakness     without overt findings  . Peripheral vascular disease ([place])   . Carotid disease, bilateral ([place])     with multiple bilateral carotid surgeries  . Psoriasis   . Meniere's disease   . Degenerative joint disease   . Gout   . Orthopnea     Two-pillow  . CKD (chronic kidney disease), stage IV ([place])  a. Has fistula in place.   . Chronic diastolic CHF (congestive heart failure) ([place])   . GERD (gastroesophageal reflux disease)   . Atrial fibrillation ([place]) Aug. 2015    a. Dx 01/2014, s/p DCCV 03/06/14.  . Sinus bradycardia     a. Toprol D/C'd due to this.   . Shingles   . On home oxygen therapy     "2L" qhs (09/09/2015  . Dementia      CURRENT MEDICATIONS: Reviewed  Patient's Medications  New Prescriptions   No medications on file  Previous Medications   ACETAMINOPHEN (TYLENOL) 325 MG TABLET    Take 2 tablets (650 mg total) by mouth every 4 (four) hours as needed for headache or mild pain.   ALBUTEROL (PROVENTIL) (2.5 MG/3ML) 0.083% NEBULIZER SOLUTION    Take 3 mLs (2.5 mg total) by nebulization every 6 (six) hours as needed for wheezing or shortness of breath.   AMOXICILLIN (AMOXIL) 500 MG  CAPSULE    Take 1 capsule (500 mg total) by mouth at bedtime.   ATORVASTATIN (LIPITOR) 80 MG TABLET    TAKE ONE TABLET BY MOUTH ONCE DAILY   BENZONATATE (TESSALON) 100 MG CAPSULE    Take 1 capsule (100 mg total) by mouth 2 (two) times daily.   CHOLECALCIFEROL (VITAMIN D) 1000 UNITS TABLET    Take 1,000 Units by mouth daily at 6 PM.    GUAIFENESIN (ROBITUSSIN) 100 MG/5ML SYRUP    Take 10 mLs (200 mg total) by mouth every 6 (six) hours as needed for cough or congestion.   INSULIN GLARGINE (LANTUS SOLOSTAR) 100 UNIT/ML SOPN    Inject 15 Units into the skin every morning.    ISOSORBIDE MONONITRATE (IMDUR) 60 MG 24 HR TABLET    Take 1 tablet (60 mg total) by mouth at bedtime.   LEVOTHYROXINE (SYNTHROID, LEVOTHROID) 175 MCG TABLET    Take 175 mcg by mouth daily before breakfast.   MEMANTINE (NAMENDA) 5 MG TABLET    Take 5 mg by mouth daily.    MULTIVITAMIN (RENA-VIT) TABS TABLET    Take 1 tablet by mouth at bedtime.   ONDANSETRON (ZOFRAN) 4 MG/2ML SOLN INJECTION    Inject 2 mLs (4 mg total) into the vein every 6 (six) hours as needed for nausea.   OXYGEN    Inhale 4 L/min into the lungs continuous. To keep O2 sats >90   PANTOPRAZOLE (PROTONIX) 40 MG TABLET    Take 1 tablet (40 mg total) by mouth daily at 6 (six) AM.   POLYETHYLENE GLYCOL (MIRALAX / GLYCOLAX) PACKET    Take 17 g by mouth daily as needed for moderate constipation.   SEVELAMER CARBONATE (RENVELA) 2.4 G PACK    Take 2.4 g by mouth 3 (three) times daily.   WARFARIN (COUMADIN) 5 MG TABLET    Take 2.5 mg daily except 5 mg on Sunday and Thursday  Modified Medications   No medications on file  Discontinued Medications   HEPARIN 1000 UNIT/ML SOLN INJECTION    1 mL (1,000 Units total) by Dialysis route as needed (in dialysis).   LIDOCAINE, PF, (XYLOCAINE) 1 % SOLN INJECTION    Inject 5 mLs into the skin as needed (topical anesthesia for hemodialysis ifGEBAUERS is ineffective.).   MALTODEXTRIN-XANTHAN GUM (RESOURCE THICKENUP CLEAR) POWD    Take  120 g by mouth as needed.     Allergies  Allergen Reactions  . Betadine [Povidone Iodine] Rash  . Iodinated Diagnostic Agents Rash and Other (See Comments)  Does fine with premedications for cath  . Latex Hives     REVIEW OF SYSTEMS:  GENERAL: no change in appetite, no fatigue, no weight changes, no fever, chills or weakness EYES: Denies change in vision, dry eyes, eye pain, itching or discharge EARS: Denies change in hearing, ringing in ears, or earache NOSE: Denies nasal congestion or epistaxis MOUTH and THROAT: Denies oral discomfort, gingival pain or bleeding, pain from teeth or hoarseness   RESPIRATORY: no cough, SOB, DOE, wheezing, hemoptysis CARDIAC: no chest pain, or palpitations, +edema GI: no abdominal pain, diarrhea, heart burn, nausea or vomiting, +constipation GU: Denies dysuria, frequency, hematuria, incontinence, or discharge PSYCHIATRIC: Denies feeling of depression or anxiety. No report of hallucinations, insomnia, paranoia, or agitation   PHYSICAL EXAMINATION  GENERAL APPEARANCE: Well nourished. In no acute distress. Normal body habitus SKIN:  Skin is warm and dry. Left heel has DTI, Coccyx has stage 2 ulcer, right gluteal area has stage 2 ulcer  HEAD: Normal in size and contour. No evidence of trauma EYES: Lids open and close normally. No blepharitis, entropion or ectropion. PERRL. Conjunctivae are clear and sclerae are white. Lenses are without opacity EARS: Pinnae are normal. Patient hears normal voice tunes of the examiner MOUTH and THROAT: Lower lip with dry crusty lesions. Oral mucosa is moist and without lesions. Tongue is normal in shape, size, and color and without lesions NECK: supple, trachea midline, no neck masses, no thyroid tenderness, no thyromegaly LYMPHATICS: no LAN in the neck, no supraclavicular LAN RESPIRATORY: breathing is even & unlabored, BS CTAB CARDIAC: RRR, + murmur,no extra heart sounds, LUE edema 2+ GI: abdomen soft, normal BS,  no masses, no tenderness, no hepatomegaly, no splenomegaly GU:  Has condom catheter attached to urine bag EXTREMITIES:  Able to move X 4 extremities; has prevalon boots on, has left upper arm AV fistula PSYCHIATRIC: Alert and oriented X 3. Affect and behavior are appropriate  LABS/RADIOLOGY: Labs reviewed: Basic Metabolic Panel:  Recent Labs  09/09/15 1542  09/17/15 0540  09/21/15 0951  09/24/15 0702  09/27/15 0540 09/28/15 0600 09/29/15 0311  NA 135  < > 136  < > 137  < > 134*  < > 127* 132* 131*  K 4.3  < > 3.8  < > 4.4  < > 4.1  < > 4.9 4.5 4.6  CL 99*  < > 95*  < > 97*  < > 98*  < > 90* 94* 94*  CO2 22  < > 30  < > 27  < > 23  < > 28 29 26   GLUCOSE 121*  < > 207*  < > 148*  < > 161*  < > 189* 173* 155*  BUN 83*  < > 93*  < > 57*  < > 58*  < > 57* 40* 56*  CREATININE 3.27*  < > 3.19*  < > 4.34*  < > 5.92*  < > 6.10* 4.38* 4.95*  CALCIUM 8.6*  < > 8.8*  < > 8.4*  < > 8.4*  < > 8.1* 8.0* 8.1*  MG 2.5*  --  2.2  --   --   --   --   --   --   --   --   PHOS  --   --  5.3*  --  6.0*  --  6.4*  --   --   --   --   < > = values in this interval not displayed. Liver Function Tests:  Recent Labs  09/09/15 1326 09/09/15 1542 09/17/15 0540 09/21/15 0951 09/24/15 0702  AST 63* 60*  --  66*  --   ALT 63 67*  --  69*  --   ALKPHOS 74 75  --  103  --   BILITOT 0.7 0.7  --  1.5*  --   PROT 6.0* 6.3*  --  6.7  --   ALBUMIN 3.4* 3.6 2.9* 2.6* 2.4*   CBC:  Recent Labs  09/09/15 1542  09/24/15 0702 09/27/15 0540 09/29/15 1518  WBC 6.1  < > 7.5 8.2 6.0  NEUTROABS 5.5  --   --   --   --   HGB 10.8*  < > 10.3* 9.6* 9.3*  HCT 34.6*  < > 33.0* 32.1* 31.0*  MCV 89.2  < > 89.9 90.2 90.1  PLT 120*  < > 129* 155 157  < > = values in this interval not displayed.  Cardiac Enzymes:  Recent Labs  09/09/15 1542 09/09/15 2140 09/10/15 0337  TROPONINI 0.03 0.03 0.04*   CBG:  Recent Labs  09/29/15 0651 09/29/15 1119 09/29/15 1950  GLUCAP 156* 232* 167*      Dg Chest 2  View  09/10/2015  CLINICAL DATA:  Congestive heart failure. EXAM: CHEST  2 VIEW COMPARISON:  09/09/2015 FINDINGS: Previous median sternotomy and CABG procedure. The heart size appears normal. There is moderate diffuse interstitial edema throughout both lungs, unchanged from previous exam. Small pleural effusions are new from the previous study. IMPRESSION: 1. Persistent changes of CHF. 2. New bilateral pleural effusions. Electronically Signed   By: Kerby Moors M.D.   On: 09/10/2015 09:02   Dg Chest 2 View  09/09/2015  CLINICAL DATA:  Cough and shortness of breath EXAM: CHEST  2 VIEW COMPARISON:  March 23, 2014 FINDINGS: No pneumothorax. Mild cardiomegaly. The hila and mediastinum are unchanged. Mild pulmonary edema is identified. No other acute abnormalities. IMPRESSION: Pulmonary edema. Electronically Signed   By: Dorise Bullion III M.D   On: 09/09/2015 13:39   Dg Chest Port 1 View  09/27/2015  CLINICAL DATA:  Dyspnea EXAM: PORTABLE CHEST 1 VIEW COMPARISON:  09/19/2015 FINDINGS: Cardiac shadow is enlarged but stable. Postsurgical changes are again noted. Chronic fibrotic changes are noted. Left basilar infiltrate is seen. Small associated left pleural effusion is seen. No bony abnormality is noted. IMPRESSION: Left basilar changes with associated effusion. Electronically Signed   By: Inez Catalina M.D.   On: 09/27/2015 03:10   Dg Chest Port 1 View  09/19/2015  CLINICAL DATA:  Respiratory distress with hypoxia. EXAM: PORTABLE CHEST 1 VIEW COMPARISON:  None. FINDINGS: The heart is enlarged. There has been previous CABG. LEFT lower lobe atelectasis, effusion, and possible infiltrate are redemonstrated, similar to priors. Diffuse BILATERAL airspace opacity suggests edema, and may be slightly improved. IMPRESSION: Slight improvement aeration from priors. Persistent BILATERAL chest abnormalities. Electronically Signed   By: Staci Righter M.D.   On: 09/19/2015 15:50   Dg Chest Port 1 View  09/18/2015   CLINICAL DATA:  Congestive heart failure EXAM: PORTABLE CHEST 1 VIEW COMPARISON:  09/10/2015 and 09/13/2015 FINDINGS: Cardiomediastinal silhouette is stable. Status post CABG again noted. Persist central vascular congestion and bilateral patchy airspace opacification with increased interstitial markings suspicious for pulmonary edema. Probable small left pleural effusion and left basilar atelectasis or infiltrate. IMPRESSION: Persist central vascular congestion and bilateral patchy airspace opacification with increased interstitial markings suspicious for pulmonary edema. Probable small left pleural effusion and left basilar atelectasis  or infiltrate. Electronically Signed   By: Lahoma Crocker M.D.   On: 09/18/2015 12:42   Dg Chest Port 1 View  09/13/2015  CLINICAL DATA:  82 year old male with history of dyspnea. EXAM: PORTABLE CHEST 1 VIEW COMPARISON:  Chest x-ray 09/10/2015. FINDINGS: There is cephalization of the pulmonary vasculature, indistinctness of the interstitial markings, and patchy airspace disease throughout the lungs bilaterally suggestive of moderate pulmonary edema. Small bilateral pleural effusions. Lung volumes are low. No pneumothorax. Mild cardiomegaly. Upper mediastinal contours are within normal limits. Atherosclerosis in the thoracic aorta. Status post median sternotomy for CABG. IMPRESSION: 1. The appearance of the chest suggests worsening congestive heart failure, as above. 2. Atherosclerosis. Electronically Signed   By: Vinnie Langton M.D.   On: 09/13/2015 16:17    ASSESSMENT/PLAN:  Generalized weakness - for rehabilitation  Acute on chronic diastolic CHF - EF [id number]; recently started on HD; check BMP; follow-up with cardiovascular, Dr. Daneen Schick, on 11/01/15; palliative consult; weigh daily  ESRD - started HD on 09/18/15; will [facility] HD [facility]  CAD S/P CABG -  continue atorvastatin 80 mg daily and Imdur ER 60 mg 1 tab by mouth daily at  bedtime  Atrial fibrillation - rate controlled; continue Coumadin; off beta blocker due to bradycardia   Long-term use of anticoagulant - INR 2.2; continue Coumadin 5 mg 1 tab by mouth every Sunday and Thursday and Coumadin 2.5 mg by mouth every Mondays-Tuesday-Wednesday-Friday-Saturday; INR on 10/05/15  Hypothyroidism - tsh 1.545; continue levothyroxine 175 g 1 tab by mouth daily  Hypertension - stable; off antihypertensives now that he is getting HD; BP/HR BID X 1 week  Anemia, iron deficiency  - given iron in the hospital; hemoglobin 9.6; check CBC  Dementia - continue Namenda 5 mg 1 tab by mouth daily  Protein calorie malnutrition, severe - albumin 2.4; start Procel 2 scoops by mouth twice a day; RD consult  UTI - Klebsiella and Escherichia coli in urine; continue amoxicillin 500 mg 1 capsule by mouth daily at bedtime and will stop on 10/01/15  Chronic respiratory failure - continue O2 at 2 L/minute via [place] when necessary to keep O2 sat > 88%  Dysphagia - ST to evaluate patient; continue CCD with nectar thickened liquids; aspiration precaution  Diabetes mellitus, type II with vascular complication -  continue Lantus 15 units subcutaneous Q AM ;  check [id number]  Constipation - discontinue MiraLAX when necessary and start MiraLAX 17 g by mouth twice a day,  senna S  2 tabs by mouth twice a day and Dulcolax suppository 10 mg 1 per rectum daily when necessary  Fever blister -  start Abreva 10% cream to lower lip lesions 5X/day 10 days  Poor appetite - RD consult; will start Procel supplementation and Eldertonic 15 ml PO BID  Pressure ulcers on left heel, coccyx and right gluteal area - wound treatment daily, air mattress overlay; start Decubivite 1 tab PO daily  GERD - continue Protonix 40 mg daily   Goals of care:  Short-term rehabilitation    [facility], NP [facility] [phone]

## 2015-10-01 ENCOUNTER — Non-Acute Institutional Stay (SKILLED_NURSING_FACILITY): Payer: Medicare Other | Admitting: Internal Medicine

## 2015-10-01 ENCOUNTER — Encounter: Payer: Self-pay | Admitting: Internal Medicine

## 2015-10-01 DIAGNOSIS — E46 Unspecified protein-calorie malnutrition: Secondary | ICD-10-CM | POA: Diagnosis not present

## 2015-10-01 DIAGNOSIS — I5042 Chronic combined systolic (congestive) and diastolic (congestive) heart failure: Secondary | ICD-10-CM

## 2015-10-01 DIAGNOSIS — E1129 Type 2 diabetes mellitus with other diabetic kidney complication: Secondary | ICD-10-CM | POA: Diagnosis not present

## 2015-10-01 DIAGNOSIS — I2510 Atherosclerotic heart disease of native coronary artery without angina pectoris: Secondary | ICD-10-CM

## 2015-10-01 DIAGNOSIS — F0390 Unspecified dementia without behavioral disturbance: Secondary | ICD-10-CM | POA: Diagnosis not present

## 2015-10-01 DIAGNOSIS — N186 End stage renal disease: Secondary | ICD-10-CM | POA: Diagnosis not present

## 2015-10-01 DIAGNOSIS — I480 Paroxysmal atrial fibrillation: Secondary | ICD-10-CM | POA: Diagnosis not present

## 2015-10-01 DIAGNOSIS — R1310 Dysphagia, unspecified: Secondary | ICD-10-CM

## 2015-10-01 DIAGNOSIS — D631 Anemia in chronic kidney disease: Secondary | ICD-10-CM | POA: Diagnosis not present

## 2015-10-01 DIAGNOSIS — E871 Hypo-osmolality and hyponatremia: Secondary | ICD-10-CM | POA: Diagnosis not present

## 2015-10-01 DIAGNOSIS — K219 Gastro-esophageal reflux disease without esophagitis: Secondary | ICD-10-CM | POA: Diagnosis not present

## 2015-10-01 DIAGNOSIS — D509 Iron deficiency anemia, unspecified: Secondary | ICD-10-CM

## 2015-10-01 DIAGNOSIS — E1151 Type 2 diabetes mellitus with diabetic peripheral angiopathy without gangrene: Secondary | ICD-10-CM | POA: Diagnosis not present

## 2015-10-01 DIAGNOSIS — L8990 Pressure ulcer of unspecified site, unspecified stage: Secondary | ICD-10-CM

## 2015-10-01 DIAGNOSIS — R531 Weakness: Secondary | ICD-10-CM | POA: Diagnosis not present

## 2015-10-01 DIAGNOSIS — Z992 Dependence on renal dialysis: Secondary | ICD-10-CM

## 2015-10-01 DIAGNOSIS — E039 Hypothyroidism, unspecified: Secondary | ICD-10-CM | POA: Diagnosis not present

## 2015-10-01 DIAGNOSIS — B001 Herpesviral vesicular dermatitis: Secondary | ICD-10-CM

## 2015-10-01 DIAGNOSIS — N390 Urinary tract infection, site not specified: Secondary | ICD-10-CM

## 2015-10-01 DIAGNOSIS — J9611 Chronic respiratory failure with hypoxia: Secondary | ICD-10-CM

## 2015-10-01 DIAGNOSIS — L03114 Cellulitis of left upper limb: Secondary | ICD-10-CM | POA: Diagnosis not present

## 2015-10-01 LAB — CBC AND DIFFERENTIAL
HEMATOCRIT: 35 % — AB (ref 41–53)
Hemoglobin: 10.3 g/dL — AB (ref 13.5–17.5)
NEUTROS ABS: 5 /uL
Platelets: 182 10*3/uL (ref 150–399)
WBC: 6.8 10*3/mL

## 2015-10-01 LAB — BASIC METABOLIC PANEL
BUN: 46 mg/dL — AB (ref 4–21)
Creatinine: 3.8 mg/dL — AB (ref 0.6–1.3)
GLUCOSE: 221 mg/dL
POTASSIUM: 4.3 mmol/L (ref 3.4–5.3)
Sodium: 133 mmol/L — AB (ref 137–147)

## 2015-10-01 LAB — HEMOGLOBIN A1C: Hemoglobin A1C: 6.3

## 2015-10-01 NOTE — Progress Notes (Signed)
LOCATION: [facility]  PCP: Donnajean Lopes, MD   Code Status: DNR  Goals of care: Advanced Directive information Advanced Directives 10/01/2015  Does patient have an advance directive? Yes  Type of Advance Directive Out of facility DNR (pink MOST or yellow form)  Does patient want to make changes to advanced directive? No - Patient declined  Copy of advanced directive(s) in chart? Yes       Extended Emergency Contact Information Primary Emergency Contact: Norby,Margaret Address: [address], [place] Phone: [phone] Mobile Phone: [phone] Relation: Spouse Secondary Emergency Contact: Laretta Alstrom [place] Mobile Phone: [phone] Relation: Son   Allergies  Allergen Reactions  . Betadine [Povidone Iodine] Rash  . Iodinated Diagnostic Agents Rash and Other (See Comments)    Does fine with premedications for cath  . Latex Hives    Chief Complaint  Patient presents with  . New Admit To SNF    New Admission     HPI:  Patient is a 82 y.o. male seen today for short term rehabilitation post hospital admission from 09/09/15-09/29/15 with acute on chronic CHF and ESRD. He was diuresed and started on hemodialysis 3 days a week. He was started on antibiotic for UTI. He has PMH of CAD S/P CABG, CKD stage IV, hypothyroidism and atrial fibrillation among others. He is seen in his room today with his wife present.   Review of Systems:  Constitutional: Negative for fever, chills, diaphoresis. Energy level is slowly coming back. HENT: Negative for headache, congestion, nasal discharge. Has hearing loss to right ear Eyes: Negative for blurred vision, double vision and discharge.  Respiratory: Negative for cough, shortness of breath and wheezing. Patient was using oxygen at night at home prior to this admission.   Cardiovascular: Negative for chest pain, palpitations, leg swelling.  Gastrointestinal:  Negative for heartburn, nausea, vomiting, abdominal pain. Per wife last bowel movement was last Thursday.  Genitourinary: Negative for dysuria and flank pain.  Musculoskeletal: Negative for back pain, fall in the facility.  Skin: Negative for itching, rash.  Neurological: Negative for dizziness. Positive for weakness. Psychiatric/Behavioral: Negative for depression.    Past Medical History  Diagnosis Date  . Hypertension   . Hyperlipidemia   . Hypothyroidism   . CAD (coronary artery disease)     a. prior CABG in 1997 with redo in 2000. b. last cath in 2008 - managed medically  . Type 2 diabetes mellitus ([place])   . Generalized weakness     without overt findings  . Peripheral vascular disease ([place])   . Carotid disease, bilateral ([place])     with multiple bilateral carotid surgeries  . Psoriasis   . Meniere's disease   . Degenerative joint disease   . Gout   . Orthopnea     Two-pillow  . CKD (chronic kidney disease), stage IV ([place])     a. Has fistula in place.   . Chronic diastolic CHF (congestive heart failure) ([place])   . GERD (gastroesophageal reflux disease)   . Atrial fibrillation ([place]) Aug. 2015    a. Dx 01/2014, s/p DCCV 03/06/14.  . Sinus bradycardia     a. Toprol D/C'd due to this.   . Shingles   . On home oxygen therapy     "2L" qhs (09/09/2015  . Dementia    Past Surgical History  Procedure Laterality Date  . Coronary artery bypass graft  1997;  2002  . Carotid endarterectomy Bilateral "several times"    "5 on right; 2-3 on left" (09/09/2015)  . Lung removal, partial    . Lumbar laminectomy  July 2001  . Thoracotomy Left     due to fungal infection  . Shoulder surgery Bilateral   . Cardiac catheterization    . Appendectomy    . Av fistula placement Left 01/21/2013    Procedure: ARTERIOVENOUS (AV) FISTULA CREATION, Left Brachiocephalic;  Surgeon: Angelia Mould, MD;  Location: [facility];  Service: Vascular;  Laterality: Left;  . Cardiac catheterization  2008     L main irreg, LAD 80%, IMA-LAD & SVG-Diag patent, CFX 100%, SVG-OM 90%, RCA 70%, SVG-RCA OK, EF nl, med rx, no vessels appropriate for PCI  . Tee without cardioversion N/A 03/06/2014    Procedure: TRANSESOPHAGEAL ECHOCARDIOGRAM (TEE);  Surgeon: Larey Dresser, MD;  Location: [facility];  Service: Cardiovascular;  Laterality: N/A;  . Cardioversion N/A 03/06/2014    Procedure: CARDIOVERSION;  Surgeon: Larey Dresser, MD;  Location: [facility];  Service: Cardiovascular;  Laterality: N/A;  . Blepharoplasty Bilateral    Social History:   reports that he has never smoked. He has never used smokeless tobacco. He reports that he does not drink alcohol or use illicit drugs.  Family History  Problem Relation Age of Onset  . Heart attack Father   . Heart disease Father     After 67 yrs of age  . Hyperlipidemia Father   . Hypertension Father   . Diabetes Mother   . Hypertension Mother   . Heart disease Mother   . Heart disease Brother   . Diabetes Brother     Medications:   Medication List       This list is accurate as of: 10/01/15  2:54 PM.  Always use your most recent med list.               ABREVA 10 % Crea  Generic drug:  Docosanol  Apply 1 application topically. Apply cream to lower lip 5 times a day for 10 days. Stop date 10/10/15     acetaminophen 325 MG tablet  Commonly known as:  TYLENOL  Take 2 tablets (650 mg total) by mouth every 4 (four) hours as needed for headache or mild pain.     albuterol (2.5 MG/3ML) 0.083% nebulizer solution  Commonly known as:  PROVENTIL  Take 3 mLs (2.5 mg total) by nebulization every 6 (six) hours as needed for wheezing or shortness of breath.     amoxicillin 500 MG capsule  Commonly known as:  AMOXIL  Take 1 capsule (500 mg total) by mouth at bedtime.     atorvastatin 80 MG tablet  Commonly known as:  LIPITOR  TAKE ONE TABLET BY MOUTH ONCE DAILY     benzonatate 100 MG capsule  Commonly known as:  TESSALON  Take 1 capsule (100 mg  total) by mouth 2 (two) times daily.     cholecalciferol 1000 units tablet  Commonly known as:  VITAMIN D  Take 1,000 Units by mouth daily at 6 PM.     DULCOLAX 10 MG suppository  Generic drug:  bisacodyl  Place 10 mg rectally daily as needed for moderate constipation.     geriatric multivitamins-minerals Elix  Take 15 mLs by mouth 2 (two) times daily.     guaifenesin 100 MG/5ML syrup  Commonly known as:  ROBITUSSIN  Take 10 mLs (200 mg total) by mouth every  6 (six) hours as needed for cough or congestion.     isosorbide mononitrate 60 MG 24 hr tablet  Commonly known as:  IMDUR  Take 1 tablet (60 mg total) by mouth at bedtime.     LANTUS SOLOSTAR 100 UNIT/ML Solostar Pen  Generic drug:  Insulin Glargine  Inject 15 Units into the skin every morning.     levothyroxine 175 MCG tablet  Commonly known as:  SYNTHROID, LEVOTHROID  Take 175 mcg by mouth daily before breakfast.     memantine 5 MG tablet  Commonly known as:  NAMENDA  Take 5 mg by mouth daily.     multivitamin Tabs tablet  Take 1 tablet by mouth at bedtime.     ondansetron 4 MG tablet  Commonly known as:  ZOFRAN  Take 4 mg by mouth every 6 (six) hours as needed for nausea or vomiting.     OXYGEN  Inhale 4 L/min into the lungs continuous. To keep O2 sats >90     pantoprazole 40 MG tablet  Commonly known as:  PROTONIX  Take 1 tablet (40 mg total) by mouth daily at 6 (six) AM.     polyethylene glycol packet  Commonly known as:  MIRALAX / GLYCOLAX  Take 17 g by mouth 2 (two) times daily.     PROCEL Powd  Take 2 scoop by mouth 2 (two) times daily.     senna 8.6 MG tablet  Commonly known as:  SENOKOT  Take 2 tablets by mouth 2 (two) times daily.     sevelamer carbonate 2.4 g Pack  Commonly known as:  RENVELA  Take 2.4 g by mouth 3 (three) times daily.     warfarin 5 MG tablet  Commonly known as:  COUMADIN  Take 5 mg by mouth daily. Take every Thursday and Sunday.     warfarin 2.5 MG tablet  Commonly  known as:  COUMADIN  Take 2.5 mg by mouth daily. DO NOT TAKE ON Thursday and Sunday        Immunizations: Immunization History  Administered Date(s) Administered  . Influenza,inj,Quad PF,36+ Mos 03/05/2014  . Influenza-Unspecified 02/25/2015  . Pneumococcal Polysaccharide-23 03/05/2014     Physical Exam: Filed Vitals:   10/01/15 1429  BP: 174/74  Pulse: 55  Temp: 98.8 F (37.1 C)  TempSrc: Oral  Resp: 20  Height: 5\' 10"  (1.778 m)  Weight: 186 lb (84.369 kg)  SpO2: 94%   Body mass index is 26.69 kg/(m^2).  General- elderly male, well built, in no acute distress Head- normocephalic, atraumatic Nose- no maxillary or frontal sinus tenderness, no nasal discharge Throat- moist mucus membrane Eyes- PERRLA, EOMI, no pallor, no icterus, no discharge, normal conjunctiva, normal sclera Neck- no cervical lymphadenopathy Cardiovascular- normal s1,s2, no murmur, trace leg edema Respiratory- poor air movement, no wheeze, no rhonchi, no crackles, no use of accessory muscles, on 3 l o2 Abdomen- bowel sounds present, soft, non tender Musculoskeletal- able to move all 4 extremities, generalized weakness, left arm edema  Neurological- alert and oriented to person only Skin- warm and dry, left heel floater present, left arm dialysis site with AV fistula with dressing in place, bruising present, fever blister to lips Psychiatry- normal mood and affect    Labs reviewed: Basic Metabolic Panel:  Recent Labs  09/09/15 1542  09/17/15 0540  09/21/15 0951  09/24/15 0702  09/27/15 0540 09/28/15 0600 09/29/15 09/29/15 0311  NA 135  < > 136  < > 137  < > 134*  < >  127* 132* 131* 131*  K 4.3  < > 3.8  < > 4.4  < > 4.1  < > 4.9 4.5  --  4.6  CL 99*  < > 95*  < > 97*  < > 98*  < > 90* 94*  --  94*  CO2 22  < > 30  < > 27  < > 23  < > 28 29  --  26  GLUCOSE 121*  < > 207*  < > 148*  < > 161*  < > 189* 173*  --  155*  BUN 83*  < > 93*  < > 57*  < > 58*  < > 57* 40* 56* 56*  CREATININE  3.27*  < > 3.19*  < > 4.34*  < > 5.92*  < > 6.10* 4.38* 5.0* 4.95*  CALCIUM 8.6*  < > 8.8*  < > 8.4*  < > 8.4*  < > 8.1* 8.0*  --  8.1*  MG 2.5*  --  2.2  --   --   --   --   --   --   --   --   --   PHOS  --   --  5.3*  --  6.0*  --  6.4*  --   --   --   --   --   < > = values in this interval not displayed. Liver Function Tests:  Recent Labs  09/09/15 1326 09/09/15 1542 09/17/15 0540 09/21/15 0951 09/24/15 0702  AST 63* 60*  --  66*  --   ALT 63 67*  --  69*  --   ALKPHOS 74 75  --  103  --   BILITOT 0.7 0.7  --  1.5*  --   PROT 6.0* 6.3*  --  6.7  --   ALBUMIN 3.4* 3.6 2.9* 2.6* 2.4*   No results for input(s): LIPASE, AMYLASE in the last 8760 hours. No results for input(s): AMMONIA in the last 8760 hours. CBC:  Recent Labs  09/09/15 1542  09/24/15 0702 09/27/15 0540 09/29/15 09/29/15 1518  WBC 6.1  < > 7.5 8.2 6.0 6.0  NEUTROABS 5.5  --   --   --   --   --   HGB 10.8*  < > 10.3* 9.6*  --  9.3*  HCT 34.6*  < > 33.0* 32.1*  --  31.0*  MCV 89.2  < > 89.9 90.2  --  90.1  PLT 120*  < > 129* 155  --  157  < > = values in this interval not displayed. Cardiac Enzymes:  Recent Labs  09/09/15 1542 09/09/15 2140 09/10/15 0337  TROPONINI 0.03 0.03 0.04*   BNP: Invalid input(s): POCBNP CBG:  Recent Labs  09/29/15 0651 09/29/15 1119 09/29/15 1950  GLUCAP 156* 232* 167*    Radiological Exams: Dg Chest 2 View  09/10/2015  CLINICAL DATA:  Congestive heart failure. EXAM: CHEST  2 VIEW COMPARISON:  09/09/2015 FINDINGS: Previous median sternotomy and CABG procedure. The heart size appears normal. There is moderate diffuse interstitial edema throughout both lungs, unchanged from previous exam. Small pleural effusions are new from the previous study. IMPRESSION: 1. Persistent changes of CHF. 2. New bilateral pleural effusions. Electronically Signed   By: Kerby Moors M.D.   On: 09/10/2015 09:02   Dg Chest 2 View  09/09/2015  CLINICAL DATA:  Cough and shortness of breath  EXAM: CHEST  2 VIEW COMPARISON:  March 23, 2014  FINDINGS: No pneumothorax. Mild cardiomegaly. The hila and mediastinum are unchanged. Mild pulmonary edema is identified. No other acute abnormalities. IMPRESSION: Pulmonary edema. Electronically Signed   By: Dorise Bullion III M.D   On: 09/09/2015 13:39   Dg Chest Port 1 View  09/27/2015  CLINICAL DATA:  Dyspnea EXAM: PORTABLE CHEST 1 VIEW COMPARISON:  09/19/2015 FINDINGS: Cardiac shadow is enlarged but stable. Postsurgical changes are again noted. Chronic fibrotic changes are noted. Left basilar infiltrate is seen. Small associated left pleural effusion is seen. No bony abnormality is noted. IMPRESSION: Left basilar changes with associated effusion. Electronically Signed   By: Inez Catalina M.D.   On: 09/27/2015 03:10   Dg Chest Port 1 View  09/19/2015  CLINICAL DATA:  Respiratory distress with hypoxia. EXAM: PORTABLE CHEST 1 VIEW COMPARISON:  None. FINDINGS: The heart is enlarged. There has been previous CABG. LEFT lower lobe atelectasis, effusion, and possible infiltrate are redemonstrated, similar to priors. Diffuse BILATERAL airspace opacity suggests edema, and may be slightly improved. IMPRESSION: Slight improvement aeration from priors. Persistent BILATERAL chest abnormalities. Electronically Signed   By: Staci Righter M.D.   On: 09/19/2015 15:50   Dg Chest Port 1 View  09/18/2015  CLINICAL DATA:  Congestive heart failure EXAM: PORTABLE CHEST 1 VIEW COMPARISON:  09/10/2015 and 09/13/2015 FINDINGS: Cardiomediastinal silhouette is stable. Status post CABG again noted. Persist central vascular congestion and bilateral patchy airspace opacification with increased interstitial markings suspicious for pulmonary edema. Probable small left pleural effusion and left basilar atelectasis or infiltrate. IMPRESSION: Persist central vascular congestion and bilateral patchy airspace opacification with increased interstitial markings suspicious for pulmonary edema.  Probable small left pleural effusion and left basilar atelectasis or infiltrate. Electronically Signed   By: Lahoma Crocker M.D.   On: 09/18/2015 12:42   Dg Chest Port 1 View  09/13/2015  CLINICAL DATA:  82 year old male with history of dyspnea. EXAM: PORTABLE CHEST 1 VIEW COMPARISON:  Chest x-ray 09/10/2015. FINDINGS: There is cephalization of the pulmonary vasculature, indistinctness of the interstitial markings, and patchy airspace disease throughout the lungs bilaterally suggestive of moderate pulmonary edema. Small bilateral pleural effusions. Lung volumes are low. No pneumothorax. Mild cardiomegaly. Upper mediastinal contours are within normal limits. Atherosclerosis in the thoracic aorta. Status post median sternotomy for CABG. IMPRESSION: 1. The appearance of the chest suggests worsening congestive heart failure, as above. 2. Atherosclerosis. Electronically Signed   By: Vinnie Langton M.D.   On: 09/13/2015 16:17    Assessment/Plan  Generalized weakness Will have him work with physical therapy and occupational therapy team to help with gait training and muscle strengthening exercises.fall precautions. Skin care. Encourage to be out of bed.   Acute on chronic diastolic CHF EF [id number]. Continue to monitor weight. Continue o2 and monitor breathing status. Continue imdur  ESRD Continue HD 3 days a week. Continue renvela  UTI Continue and complete her amoxicillin today. Monitor clinically, continue foley care  Protein calorie malnutrition Monitor po intake and weight. Continue eldertonic and renavit  Dysphagia Aspiration precautions, SLP consult  Pressure ulcer Continue wound care with air mattress and pressure ulcer prophylaxis  Fever blister Continue abreva ointment for a week and monitor  Constipation Continue miralax 17 g bid and senna s 2 tab bid with dulcolax suppository 10 mg pr prn  Iron def anemia S/p iron in hospital. Monitor cbc  Hyponatremia Monitor bmp  CAD  S/P  CABG. Continue imdur and atorvastatin.  Atrial fibrillation  continue Coumadin for anticoagulation. Off AV nodal blocking  agent with bradycardia.   Hypothyroidism continue levothyroxine 175 mcg daily  Dementia continue Namenda and supportive care  Chronic respiratory failure continue O2 at 3 L/minute via [place] and wean off as tolerated to keep it in place for night time  Diabetes mellitus Lab Results  Component Value Date   HGBA1C 7.3* 03/04/2014   Monitor cbg, continue Lantus 15 units daily and check a1c   GERD continue Protonix 40 mg daily    Goals of care: short term rehabilitation   Labs/tests ordered: cbc, bmp  Family/ staff Communication: reviewed care plan with patient, his wife and nursing supervisor    Blanchie Serve, MD Internal Medicine [address] [place], [place] [address] Cell Phone (Monday-Friday 8 am - 5 pm): [phone] On Call: [phone] and follow prompts after 5 pm and on weekends Office Phone: [phone] Office Fax: [phone]

## 2015-10-04 DIAGNOSIS — D631 Anemia in chronic kidney disease: Secondary | ICD-10-CM | POA: Diagnosis not present

## 2015-10-04 DIAGNOSIS — D509 Iron deficiency anemia, unspecified: Secondary | ICD-10-CM | POA: Diagnosis not present

## 2015-10-04 DIAGNOSIS — E1129 Type 2 diabetes mellitus with other diabetic kidney complication: Secondary | ICD-10-CM | POA: Diagnosis not present

## 2015-10-04 DIAGNOSIS — L03114 Cellulitis of left upper limb: Secondary | ICD-10-CM | POA: Diagnosis not present

## 2015-10-04 DIAGNOSIS — N186 End stage renal disease: Secondary | ICD-10-CM | POA: Diagnosis not present

## 2015-10-05 DIAGNOSIS — R531 Weakness: Secondary | ICD-10-CM | POA: Diagnosis not present

## 2015-10-06 ENCOUNTER — Ambulatory Visit: Payer: Medicare Other | Admitting: Podiatry

## 2015-10-06 DIAGNOSIS — E1129 Type 2 diabetes mellitus with other diabetic kidney complication: Secondary | ICD-10-CM | POA: Diagnosis not present

## 2015-10-06 DIAGNOSIS — D509 Iron deficiency anemia, unspecified: Secondary | ICD-10-CM | POA: Diagnosis not present

## 2015-10-06 DIAGNOSIS — L03114 Cellulitis of left upper limb: Secondary | ICD-10-CM | POA: Diagnosis not present

## 2015-10-06 DIAGNOSIS — N186 End stage renal disease: Secondary | ICD-10-CM | POA: Diagnosis not present

## 2015-10-06 DIAGNOSIS — D631 Anemia in chronic kidney disease: Secondary | ICD-10-CM | POA: Diagnosis not present

## 2015-10-07 ENCOUNTER — Encounter: Payer: Self-pay | Admitting: Adult Health

## 2015-10-07 ENCOUNTER — Non-Acute Institutional Stay (SKILLED_NURSING_FACILITY): Payer: Medicare Other | Admitting: Adult Health

## 2015-10-07 DIAGNOSIS — Z7901 Long term (current) use of anticoagulants: Secondary | ICD-10-CM

## 2015-10-07 DIAGNOSIS — I480 Paroxysmal atrial fibrillation: Secondary | ICD-10-CM | POA: Diagnosis not present

## 2015-10-07 NOTE — Progress Notes (Signed)
Patient ID: [name], male   DOB: [birth year], 82 y.o.   MRN: [id number] Subjective:     Indication: atrial fibrillation Bleeding signs/symptoms: None Thromboembolic signs/symptoms: None  Missed Coumadin doses: This week - 2 due to supratherapeutic INR Medication changes: no Dietary changes: no Bacterial/viral infection: no Other concerns: no  The following portions of the patient's history were reviewed and updated as appropriate: allergies, current medications, past family history, past medical history, past social history, past surgical history and problem list.  Review of Systems A comprehensive review of systems was negative.   Objective:    INR Today: 2.4 Current dose: Coumadin 5 mg PO every Sunday and Thursday, 2.5 mg every Monday, Tuesday, Wednesday, Friday and Saturday   Assessment:    Therapeutic INR for goal of 2-3   Plan:    1. New dose: Decrease Coumadin to 2.5 mg 1 tab by mouth daily   2. Next INR: 10/11/15

## 2015-10-08 DIAGNOSIS — D631 Anemia in chronic kidney disease: Secondary | ICD-10-CM | POA: Diagnosis not present

## 2015-10-08 DIAGNOSIS — D509 Iron deficiency anemia, unspecified: Secondary | ICD-10-CM | POA: Diagnosis not present

## 2015-10-08 DIAGNOSIS — N186 End stage renal disease: Secondary | ICD-10-CM | POA: Diagnosis not present

## 2015-10-08 DIAGNOSIS — L03114 Cellulitis of left upper limb: Secondary | ICD-10-CM | POA: Diagnosis not present

## 2015-10-08 DIAGNOSIS — E1129 Type 2 diabetes mellitus with other diabetic kidney complication: Secondary | ICD-10-CM | POA: Diagnosis not present

## 2015-10-11 DIAGNOSIS — L03114 Cellulitis of left upper limb: Secondary | ICD-10-CM | POA: Diagnosis not present

## 2015-10-11 DIAGNOSIS — N186 End stage renal disease: Secondary | ICD-10-CM | POA: Diagnosis not present

## 2015-10-11 DIAGNOSIS — D509 Iron deficiency anemia, unspecified: Secondary | ICD-10-CM | POA: Diagnosis not present

## 2015-10-11 DIAGNOSIS — E1129 Type 2 diabetes mellitus with other diabetic kidney complication: Secondary | ICD-10-CM | POA: Diagnosis not present

## 2015-10-11 DIAGNOSIS — D631 Anemia in chronic kidney disease: Secondary | ICD-10-CM | POA: Diagnosis not present

## 2015-10-12 LAB — BASIC METABOLIC PANEL
BUN: 23 mg/dL — AB (ref 4–21)
Creatinine: 3.2 mg/dL — AB (ref 0.6–1.3)
GLUCOSE: 241 mg/dL
Potassium: 3.6 mmol/L (ref 3.4–5.3)
Sodium: 138 mmol/L (ref 137–147)

## 2015-10-13 DIAGNOSIS — E1129 Type 2 diabetes mellitus with other diabetic kidney complication: Secondary | ICD-10-CM | POA: Diagnosis not present

## 2015-10-13 DIAGNOSIS — N186 End stage renal disease: Secondary | ICD-10-CM | POA: Diagnosis not present

## 2015-10-13 DIAGNOSIS — L03114 Cellulitis of left upper limb: Secondary | ICD-10-CM | POA: Diagnosis not present

## 2015-10-13 DIAGNOSIS — D631 Anemia in chronic kidney disease: Secondary | ICD-10-CM | POA: Diagnosis not present

## 2015-10-13 DIAGNOSIS — D509 Iron deficiency anemia, unspecified: Secondary | ICD-10-CM | POA: Diagnosis not present

## 2015-10-14 ENCOUNTER — Encounter: Payer: Self-pay | Admitting: Adult Health

## 2015-10-14 ENCOUNTER — Non-Acute Institutional Stay (SKILLED_NURSING_FACILITY): Payer: Medicare Other | Admitting: Adult Health

## 2015-10-14 DIAGNOSIS — I480 Paroxysmal atrial fibrillation: Secondary | ICD-10-CM

## 2015-10-14 DIAGNOSIS — Z7901 Long term (current) use of anticoagulants: Secondary | ICD-10-CM | POA: Diagnosis not present

## 2015-10-14 NOTE — Progress Notes (Signed)
Patient ID: [name], male   DOB: 07-Jun-1934, 82 y.o.   MRN: [id number] Subjective:     Indication: atrial fibrillation Bleeding signs/symptoms: None Thromboembolic signs/symptoms: None  Missed Coumadin doses: None Medication changes: no Dietary changes: no Bacterial/viral infection: no Other concerns: no  The following portions of the patient's history were reviewed and updated as appropriate: allergies, current medications, past family history, past medical history, past social history, past surgical history and problem list.  Review of Systems A comprehensive review of systems was negative.   Objective:    INR Today: 1.7 Current dose: Coumadin 2.5 mg daily     Assessment:    Subtherapeutic INR for goal of 2-3   Plan:    1. New dose:  Increase Coumadin to 3 mg PO daily   2. Next INR: 10/18/15

## 2015-10-15 DIAGNOSIS — D509 Iron deficiency anemia, unspecified: Secondary | ICD-10-CM | POA: Diagnosis not present

## 2015-10-15 DIAGNOSIS — L03114 Cellulitis of left upper limb: Secondary | ICD-10-CM | POA: Diagnosis not present

## 2015-10-15 DIAGNOSIS — E1129 Type 2 diabetes mellitus with other diabetic kidney complication: Secondary | ICD-10-CM | POA: Diagnosis not present

## 2015-10-15 DIAGNOSIS — D631 Anemia in chronic kidney disease: Secondary | ICD-10-CM | POA: Diagnosis not present

## 2015-10-15 DIAGNOSIS — N186 End stage renal disease: Secondary | ICD-10-CM | POA: Diagnosis not present

## 2015-10-18 DIAGNOSIS — N186 End stage renal disease: Secondary | ICD-10-CM | POA: Diagnosis not present

## 2015-10-18 DIAGNOSIS — E1129 Type 2 diabetes mellitus with other diabetic kidney complication: Secondary | ICD-10-CM | POA: Diagnosis not present

## 2015-10-18 DIAGNOSIS — D509 Iron deficiency anemia, unspecified: Secondary | ICD-10-CM | POA: Diagnosis not present

## 2015-10-18 DIAGNOSIS — D631 Anemia in chronic kidney disease: Secondary | ICD-10-CM | POA: Diagnosis not present

## 2015-10-18 DIAGNOSIS — L03114 Cellulitis of left upper limb: Secondary | ICD-10-CM | POA: Diagnosis not present

## 2015-10-20 DIAGNOSIS — L03114 Cellulitis of left upper limb: Secondary | ICD-10-CM | POA: Diagnosis not present

## 2015-10-20 DIAGNOSIS — N186 End stage renal disease: Secondary | ICD-10-CM | POA: Diagnosis not present

## 2015-10-20 DIAGNOSIS — D509 Iron deficiency anemia, unspecified: Secondary | ICD-10-CM | POA: Diagnosis not present

## 2015-10-20 DIAGNOSIS — E1129 Type 2 diabetes mellitus with other diabetic kidney complication: Secondary | ICD-10-CM | POA: Diagnosis not present

## 2015-10-20 DIAGNOSIS — D631 Anemia in chronic kidney disease: Secondary | ICD-10-CM | POA: Diagnosis not present

## 2015-10-21 ENCOUNTER — Non-Acute Institutional Stay (SKILLED_NURSING_FACILITY): Payer: Medicare Other | Admitting: Adult Health

## 2015-10-21 ENCOUNTER — Encounter: Payer: Self-pay | Admitting: Adult Health

## 2015-10-21 DIAGNOSIS — J9611 Chronic respiratory failure with hypoxia: Secondary | ICD-10-CM | POA: Diagnosis not present

## 2015-10-21 DIAGNOSIS — I480 Paroxysmal atrial fibrillation: Secondary | ICD-10-CM

## 2015-10-21 DIAGNOSIS — E1151 Type 2 diabetes mellitus with diabetic peripheral angiopathy without gangrene: Secondary | ICD-10-CM

## 2015-10-21 DIAGNOSIS — Z7901 Long term (current) use of anticoagulants: Secondary | ICD-10-CM

## 2015-10-21 NOTE — Progress Notes (Signed)
Patient ID: [name], male   DOB: 1934-06-21, 82 y.o.   MRN: [id number]    DATE:  10/21/2015   MRN:  [id number]  BIRTHDAY: [birth year]  Facility:  Nursing Home Location:  [facility] [place] [facility] [place] Room Number: 902-P  LEVEL OF CARE:  SNF (31)  Contact Information    Name Relation Home Work [place] Spouse [phone]  [phone]   [name]   [phone]       Code Status History    Date Active Date Inactive Code Status Order ID Comments User Context   09/23/2015  1:26 PM 09/29/2015 11:10 PM DNR [id number]  Micheline Rough, MD Inpatient   09/09/2015  3:29 PM 09/23/2015  1:26 PM Full Code [id number]  Belva Crome, MD ED   03/24/2014 12:41 AM 03/24/2014  6:34 PM Full Code [id number]  Shanda Howells, MD ED   03/04/2014  4:32 PM 03/07/2014  3:54 PM Full Code [id number]  Lonn Georgia, PA-C ED    Questions for Most Recent Historical Code Status (Order [id number])    Question Answer Comment   In the event of cardiac or respiratory ARREST Do not call a "code blue"    In the event of cardiac or respiratory ARREST Do not perform Intubation, CPR, defibrillation or ACLS    In the event of cardiac or respiratory ARREST Use medication by any route, position, wound care, and other measures to relive pain and suffering. May use oxygen, suction and manual treatment of airway obstruction as needed for comfort.     Advance Directive Documentation        Most Recent Value   Type of Advance Directive  Out of facility DNR (pink MOST or yellow form)   Pre-existing out of facility DNR order (yellow form or pink MOST form)     "MOST" Form in Place?         Chief Complaint  Patient presents with  . Acute Visit    Coumadin therapy, elevated blood sugar    HISTORY OF PRESENT ILLNESS:   This is a 82 year old male who was noted to have elevated CBGs - 169, 280, 135, 288, 178, 313, 200. He is currently taking Lantus 15 units daily. His appetite has recently increased.  He was seen in his room while eating lunch with speech therapist. Patient has no SOB and reported to be participating well in therapy. He is currently on O2 @ 3L/min at all times. Wife reports that patient is only on O2 @ night when he was at home.   He has been admitted to [facility] on 09/29/15 from [facility]. He hasPMH of CAD S/P CABG, bradycardia, CKD stage IV, hypothyroidism and atrial fibrillation. He was having progressive cough, dyspnea and BLE edema with R>L. He was found to have chronic combined systolic and diastolic CHF.   Initially was diuresed with lasix drip + metolazone but due to poor response and worsening renal function,  Nephrology was consulted. He started on HD. Volume status improved with HD. Overall 31 pounds removed. He had UTI discharged with Amoxicillin 500 mg Q HS and will complete on 10/01/15. He will follow-up with Palliative Care.  He has been admitted for a short-term rehabilitation.  PAST MEDICAL HISTORY:  Past Medical History  Diagnosis Date  . Hypertension   . Hyperlipidemia   . Hypothyroidism   . CAD (coronary artery disease)     a. prior CABG in 1997 with redo in 2000.  b. last cath in 2008 - managed medically  . Type 2 diabetes mellitus ([place])   . Generalized weakness     without overt findings  . Peripheral vascular disease ([place])   . Carotid disease, bilateral ([place])     with multiple bilateral carotid surgeries  . Psoriasis   . Meniere's disease   . Degenerative joint disease   . Gout   . Orthopnea     Two-pillow  . CKD (chronic kidney disease), stage IV ([place])     a. Has fistula in place.   . Chronic diastolic CHF (congestive heart failure) ([place])   . GERD (gastroesophageal reflux disease)   . Atrial fibrillation ([place]) Aug. 2015    a. Dx 01/2014, s/p DCCV 03/06/14.  . Sinus bradycardia     a. Toprol D/C'd due to this.   . Shingles   . On home oxygen therapy     "2L" qhs (09/09/2015  . Dementia      CURRENT MEDICATIONS: Reviewed   Patient's Medications  New Prescriptions   No medications on file  Previous Medications   ACETAMINOPHEN (TYLENOL) 325 MG TABLET    Take 2 tablets (650 mg total) by mouth every 4 (four) hours as needed for headache or mild pain.   ALBUTEROL (PROVENTIL) (2.5 MG/3ML) 0.083% NEBULIZER SOLUTION    Take 3 mLs (2.5 mg total) by nebulization every 6 (six) hours as needed for wheezing or shortness of breath.   ATORVASTATIN (LIPITOR) 80 MG TABLET    TAKE ONE TABLET BY MOUTH ONCE DAILY   BENZONATATE (TESSALON) 100 MG CAPSULE    Take 1 capsule (100 mg total) by mouth 2 (two) times daily.   BISACODYL (DULCOLAX) 10 MG SUPPOSITORY    Place 10 mg rectally daily as needed for moderate constipation.   CHOLECALCIFEROL (VITAMIN D) 1000 UNITS TABLET    Take 1,000 Units by mouth daily at 6 PM. D3   GERIATRIC MULTIVITAMINS-MINERALS (ELDERTONIC/GEVRABON) ELIX    Take 15 mLs by mouth 2 (two) times daily.    GUAIFENESIN (ROBITUSSIN) 100 MG/5ML SYRUP    Take 10 mLs (200 mg total) by mouth every 6 (six) hours as needed for cough or congestion.   INSULIN GLARGINE (LANTUS SOLOSTAR) 100 UNIT/ML SOPN    Inject 15 Units into the skin every morning.    ISOSORBIDE MONONITRATE (IMDUR) 60 MG 24 HR TABLET    Take 1 tablet (60 mg total) by mouth at bedtime.   LEVOTHYROXINE (SYNTHROID, LEVOTHROID) 175 MCG TABLET    Take 175 mcg by mouth daily before breakfast.   MEMANTINE (NAMENDA) 5 MG TABLET    Take 5 mg by mouth daily.    MULTIPLE VITAMIN (MULTIVITAMIN) TABLET    Take 1 tablet by mouth at bedtime.   MULTIPLE VITAMINS-MINERALS (DECUBI-VITE) CAPS    Take 1 capsule by mouth daily.   ONDANSETRON (ZOFRAN) 4 MG TABLET    Take 4 mg by mouth every 6 (six) hours as needed for nausea or vomiting.   OXYGEN    Inhale 4 L/min into the lungs continuous. To keep O2 sats >90   PANTOPRAZOLE (PROTONIX) 40 MG TABLET    Take 1 tablet (40 mg total) by mouth daily at 6 (six) AM.   POLYETHYLENE GLYCOL (MIRALAX / GLYCOLAX) PACKET    Take 17 g by mouth 2  (two) times daily.   PROTEIN (PROCEL) POWD    Take 2 scoop by mouth 2 (two) times daily.   SENNOSIDES-DOCUSATE SODIUM (SENOKOT-S) 8.6-50 MG TABLET  Take 2 tablets by mouth 2 (two) times daily.   SEVELAMER CARBONATE (RENVELA) 2.4 G PACK    Take 2.4 g by mouth 3 (three) times daily.   WARFARIN (COUMADIN) 2.5 MG TABLET    Take 2.5 mg by mouth daily. Take 2.5 mg M-F   WARFARIN (COUMADIN) 3 MG TABLET    Take 3 mg by mouth daily. Take on Saturday and Sunday  Modified Medications   No medications on file  Discontinued Medications   No medications on file     Allergies  Allergen Reactions  . Betadine [Povidone Iodine] Rash  . Iodinated Diagnostic Agents Rash and Other (See Comments)    Does fine with premedications for cath  . Latex Hives     REVIEW OF SYSTEMS:  GENERAL: no change in appetite, no fatigue, no weight changes, no fever, chills or weakness EYES: Denies change in vision, dry eyes, eye pain, itching or discharge EARS: Denies change in hearing, ringing in ears, or earache NOSE: Denies nasal congestion or epistaxis MOUTH and THROAT: Denies oral discomfort, gingival pain or bleeding, pain from teeth or hoarseness   RESPIRATORY: no cough, SOB, DOE, wheezing, hemoptysis CARDIAC: no chest pain, or palpitations, +edema GI: no abdominal pain, diarrhea, heart burn, nausea or vomiting GU: Denies dysuria, frequency, hematuria, incontinence, or discharge PSYCHIATRIC: Denies feeling of depression or anxiety. No report of hallucinations, insomnia, paranoia, or agitation   PHYSICAL EXAMINATION  GENERAL APPEARANCE: Well nourished. In no acute distress. Normal body habitus SKIN:  Skin is warm and dry. Coccyx has stage 2 ulcer HEAD: Normal in size and contour. No evidence of trauma EYES: Lids open and close normally. No blepharitis, entropion or ectropion. PERRL. Conjunctivae are clear and sclerae are white. Lenses are without opacity EARS: Pinnae are normal. Patient hears normal voice  tunes of the examiner MOUTH and THROAT: Lower lip with dry crusty lesions. Oral mucosa is moist and without lesions. Tongue is normal in shape, size, and color and without lesions NECK: supple, trachea midline, no neck masses, no thyroid tenderness, no thyromegaly LYMPHATICS: no LAN in the neck, no supraclavicular LAN RESPIRATORY: breathing is even & unlabored, BS CTAB CARDIAC: RRR, + murmur,no extra heart sounds, LUE trace edema  GI: abdomen soft, normal BS, no masses, no tenderness, no hepatomegaly, no splenomegaly GU:  Has condom catheter attached to urine bag EXTREMITIES:  Able to move X 4 extremities; has bilateral prevalon boots on, has left upper arm AV fistula PSYCHIATRIC: Alert and oriented X 3 with occasional confusion. Affect and behavior are appropriate  LABS/RADIOLOGY: Labs reviewed: Basic Metabolic Panel:  Recent Labs  09/09/15 1542  09/17/15 0540  09/21/15 0951  09/24/15 0702  09/27/15 0540 09/28/15 0600  09/29/15 0311 10/01/15 10/12/15  NA 135  < > 136  < > 137  < > 134*  < > 127* 132*  < > 131* 133* 138  K 4.3  < > 3.8  < > 4.4  < > 4.1  < > 4.9 4.5  --  4.6 4.3 3.6  CL 99*  < > 95*  < > 97*  < > 98*  < > 90* 94*  --  94*  --   --   CO2 22  < > 30  < > 27  < > 23  < > 28 29  --  26  --   --   GLUCOSE 121*  < > 207*  < > 148*  < > 161*  < > 189* 173*  --  155*  --   --   BUN 83*  < > 93*  < > 57*  < > 58*  < > 57* 40*  < > 56* 46* 23*  CREATININE 3.27*  < > 3.19*  < > 4.34*  < > 5.92*  < > 6.10* 4.38*  < > 4.95* 3.8* 3.2*  CALCIUM 8.6*  < > 8.8*  < > 8.4*  < > 8.4*  < > 8.1* 8.0*  --  8.1*  --   --   MG 2.5*  --  2.2  --   --   --   --   --   --   --   --   --   --   --   PHOS  --   --  5.3*  --  6.0*  --  6.4*  --   --   --   --   --   --   --   < > = values in this interval not displayed. Liver Function Tests:  Recent Labs  09/09/15 1326 09/09/15 1542 09/17/15 0540 09/21/15 0951 09/24/15 0702  AST 63* 60*  --  66*  --   ALT 63 67*  --  69*  --   ALKPHOS  74 75  --  103  --   BILITOT 0.7 0.7  --  1.5*  --   PROT 6.0* 6.3*  --  6.7  --   ALBUMIN 3.4* 3.6 2.9* 2.6* 2.4*   CBC:  Recent Labs  09/09/15 1542  09/24/15 0702 09/27/15 0540 09/29/15 09/29/15 1518 10/01/15  WBC 6.1  < > 7.5 8.2 6.0 6.0 6.8  NEUTROABS 5.5  --   --   --   --   --  5  HGB 10.8*  < > 10.3* 9.6*  --  9.3* 10.3*  HCT 34.6*  < > 33.0* 32.1*  --  31.0* 35*  MCV 89.2  < > 89.9 90.2  --  90.1  --   PLT 120*  < > 129* 155  --  157 182  < > = values in this interval not displayed.  Cardiac Enzymes:  Recent Labs  09/09/15 1542 09/09/15 2140 09/10/15 0337  TROPONINI 0.03 0.03 0.04*   CBG:  Recent Labs  09/29/15 0651 09/29/15 1119 09/29/15 1950  GLUCAP 156* 232* 167*      Dg Chest Port 1 View  09/27/2015  CLINICAL DATA:  Dyspnea EXAM: PORTABLE CHEST 1 VIEW COMPARISON:  09/19/2015 FINDINGS: Cardiac shadow is enlarged but stable. Postsurgical changes are again noted. Chronic fibrotic changes are noted. Left basilar infiltrate is seen. Small associated left pleural effusion is seen. No bony abnormality is noted. IMPRESSION: Left basilar changes with associated effusion. Electronically Signed   By: Inez Catalina M.D.   On: 09/27/2015 03:10    ASSESSMENT/PLAN:  Long-term use of anticoagulant - INR 1.7; subtherapeutic; give Coumadin 5 mg PO X 1 today then Coumadin 2.5 mg daily; check INR on 10/26/15  Atrial fibrillation - rate controlled; continue Coumadin; off beta blocker due to bradycardia   Diabetes mellitus, type II with vascular complication -  Increase Lantus to 20 units SQ daily and start Novolog 100 units/ml give 5 units SQ BID for CBG > 150  Chronic respiratory failure  - no SOB; wean O2 as tolerated       [place], NP [place]

## 2015-10-22 DIAGNOSIS — L03114 Cellulitis of left upper limb: Secondary | ICD-10-CM | POA: Diagnosis not present

## 2015-10-22 DIAGNOSIS — N186 End stage renal disease: Secondary | ICD-10-CM | POA: Diagnosis not present

## 2015-10-22 DIAGNOSIS — D631 Anemia in chronic kidney disease: Secondary | ICD-10-CM | POA: Diagnosis not present

## 2015-10-22 DIAGNOSIS — D509 Iron deficiency anemia, unspecified: Secondary | ICD-10-CM | POA: Diagnosis not present

## 2015-10-22 DIAGNOSIS — E1129 Type 2 diabetes mellitus with other diabetic kidney complication: Secondary | ICD-10-CM | POA: Diagnosis not present

## 2015-10-24 DIAGNOSIS — N186 End stage renal disease: Secondary | ICD-10-CM | POA: Diagnosis not present

## 2015-10-24 DIAGNOSIS — Z992 Dependence on renal dialysis: Secondary | ICD-10-CM | POA: Diagnosis not present

## 2015-10-24 DIAGNOSIS — E1122 Type 2 diabetes mellitus with diabetic chronic kidney disease: Secondary | ICD-10-CM | POA: Diagnosis not present

## 2015-10-25 DIAGNOSIS — E1129 Type 2 diabetes mellitus with other diabetic kidney complication: Secondary | ICD-10-CM | POA: Diagnosis not present

## 2015-10-25 DIAGNOSIS — E1122 Type 2 diabetes mellitus with diabetic chronic kidney disease: Secondary | ICD-10-CM | POA: Diagnosis not present

## 2015-10-25 DIAGNOSIS — D509 Iron deficiency anemia, unspecified: Secondary | ICD-10-CM | POA: Diagnosis not present

## 2015-10-25 DIAGNOSIS — N186 End stage renal disease: Secondary | ICD-10-CM | POA: Diagnosis not present

## 2015-10-25 DIAGNOSIS — D631 Anemia in chronic kidney disease: Secondary | ICD-10-CM | POA: Diagnosis not present

## 2015-10-27 DIAGNOSIS — D509 Iron deficiency anemia, unspecified: Secondary | ICD-10-CM | POA: Diagnosis not present

## 2015-10-27 DIAGNOSIS — D631 Anemia in chronic kidney disease: Secondary | ICD-10-CM | POA: Diagnosis not present

## 2015-10-27 DIAGNOSIS — E1122 Type 2 diabetes mellitus with diabetic chronic kidney disease: Secondary | ICD-10-CM | POA: Diagnosis not present

## 2015-10-27 DIAGNOSIS — E1129 Type 2 diabetes mellitus with other diabetic kidney complication: Secondary | ICD-10-CM | POA: Diagnosis not present

## 2015-10-27 DIAGNOSIS — N186 End stage renal disease: Secondary | ICD-10-CM | POA: Diagnosis not present

## 2015-10-29 DIAGNOSIS — D631 Anemia in chronic kidney disease: Secondary | ICD-10-CM | POA: Diagnosis not present

## 2015-10-29 DIAGNOSIS — N186 End stage renal disease: Secondary | ICD-10-CM | POA: Diagnosis not present

## 2015-10-29 DIAGNOSIS — E1129 Type 2 diabetes mellitus with other diabetic kidney complication: Secondary | ICD-10-CM | POA: Diagnosis not present

## 2015-10-29 DIAGNOSIS — D509 Iron deficiency anemia, unspecified: Secondary | ICD-10-CM | POA: Diagnosis not present

## 2015-10-29 DIAGNOSIS — E1122 Type 2 diabetes mellitus with diabetic chronic kidney disease: Secondary | ICD-10-CM | POA: Diagnosis not present

## 2015-11-01 ENCOUNTER — Ambulatory Visit: Payer: Medicare Other | Admitting: Cardiology

## 2015-11-01 DIAGNOSIS — D631 Anemia in chronic kidney disease: Secondary | ICD-10-CM | POA: Diagnosis not present

## 2015-11-01 DIAGNOSIS — D509 Iron deficiency anemia, unspecified: Secondary | ICD-10-CM | POA: Diagnosis not present

## 2015-11-01 DIAGNOSIS — E1122 Type 2 diabetes mellitus with diabetic chronic kidney disease: Secondary | ICD-10-CM | POA: Diagnosis not present

## 2015-11-01 DIAGNOSIS — N186 End stage renal disease: Secondary | ICD-10-CM | POA: Diagnosis not present

## 2015-11-01 DIAGNOSIS — E1129 Type 2 diabetes mellitus with other diabetic kidney complication: Secondary | ICD-10-CM | POA: Diagnosis not present

## 2015-11-02 DIAGNOSIS — Z992 Dependence on renal dialysis: Secondary | ICD-10-CM | POA: Diagnosis not present

## 2015-11-02 DIAGNOSIS — T82868D Thrombosis of vascular prosthetic devices, implants and grafts, subsequent encounter: Secondary | ICD-10-CM | POA: Diagnosis not present

## 2015-11-02 DIAGNOSIS — N186 End stage renal disease: Secondary | ICD-10-CM | POA: Diagnosis not present

## 2015-11-02 DIAGNOSIS — I871 Compression of vein: Secondary | ICD-10-CM | POA: Diagnosis not present

## 2015-11-03 DIAGNOSIS — D509 Iron deficiency anemia, unspecified: Secondary | ICD-10-CM | POA: Diagnosis not present

## 2015-11-03 DIAGNOSIS — E1129 Type 2 diabetes mellitus with other diabetic kidney complication: Secondary | ICD-10-CM | POA: Diagnosis not present

## 2015-11-03 DIAGNOSIS — E1122 Type 2 diabetes mellitus with diabetic chronic kidney disease: Secondary | ICD-10-CM | POA: Diagnosis not present

## 2015-11-03 DIAGNOSIS — N186 End stage renal disease: Secondary | ICD-10-CM | POA: Diagnosis not present

## 2015-11-03 DIAGNOSIS — D631 Anemia in chronic kidney disease: Secondary | ICD-10-CM | POA: Diagnosis not present

## 2015-11-04 ENCOUNTER — Encounter: Payer: Self-pay | Admitting: Adult Health

## 2015-11-04 ENCOUNTER — Non-Acute Institutional Stay (SKILLED_NURSING_FACILITY): Payer: Medicare Other | Admitting: Adult Health

## 2015-11-04 DIAGNOSIS — N186 End stage renal disease: Secondary | ICD-10-CM

## 2015-11-04 DIAGNOSIS — I480 Paroxysmal atrial fibrillation: Secondary | ICD-10-CM | POA: Diagnosis not present

## 2015-11-04 DIAGNOSIS — K219 Gastro-esophageal reflux disease without esophagitis: Secondary | ICD-10-CM | POA: Diagnosis not present

## 2015-11-04 DIAGNOSIS — L8990 Pressure ulcer of unspecified site, unspecified stage: Secondary | ICD-10-CM

## 2015-11-04 DIAGNOSIS — R531 Weakness: Secondary | ICD-10-CM | POA: Diagnosis not present

## 2015-11-04 DIAGNOSIS — E43 Unspecified severe protein-calorie malnutrition: Secondary | ICD-10-CM

## 2015-11-04 DIAGNOSIS — E1151 Type 2 diabetes mellitus with diabetic peripheral angiopathy without gangrene: Secondary | ICD-10-CM | POA: Diagnosis not present

## 2015-11-04 DIAGNOSIS — I2510 Atherosclerotic heart disease of native coronary artery without angina pectoris: Secondary | ICD-10-CM

## 2015-11-04 DIAGNOSIS — J9610 Chronic respiratory failure, unspecified whether with hypoxia or hypercapnia: Secondary | ICD-10-CM

## 2015-11-04 DIAGNOSIS — E039 Hypothyroidism, unspecified: Secondary | ICD-10-CM | POA: Diagnosis not present

## 2015-11-04 DIAGNOSIS — Z992 Dependence on renal dialysis: Secondary | ICD-10-CM

## 2015-11-04 DIAGNOSIS — R630 Anorexia: Secondary | ICD-10-CM | POA: Diagnosis not present

## 2015-11-04 DIAGNOSIS — R1310 Dysphagia, unspecified: Secondary | ICD-10-CM

## 2015-11-04 DIAGNOSIS — F0390 Unspecified dementia without behavioral disturbance: Secondary | ICD-10-CM

## 2015-11-04 DIAGNOSIS — D509 Iron deficiency anemia, unspecified: Secondary | ICD-10-CM

## 2015-11-04 DIAGNOSIS — I5032 Chronic diastolic (congestive) heart failure: Secondary | ICD-10-CM

## 2015-11-04 DIAGNOSIS — K5901 Slow transit constipation: Secondary | ICD-10-CM

## 2015-11-04 NOTE — Progress Notes (Signed)
Patient ID: [name], male   DOB: 1933-12-22, 82 y.o.   MRN: [id number]    DATE:  11/04/15  MRN:  [id number]  BIRTHDAY: 1933-07-08  Facility:  Nursing Home Location:  [place] Room Number: 902-P  LEVEL OF CARE:  SNF (31)  Contact Information    Name Relation Home Work [place] Spouse [phone]  [phone]   Sonnie, [place]   [phone]       Code Status History    Date Active Date Inactive Code Status Order ID Comments User Context   09/23/2015  1:26 PM 09/29/2015 11:10 PM DNR [id number]  Micheline Rough, MD Inpatient   09/09/2015  3:29 PM 09/23/2015  1:26 PM Full Code [id number]  Belva Crome, MD ED   03/24/2014 12:41 AM 03/24/2014  6:34 PM Full Code [id number]  Shanda Howells, MD ED   03/04/2014  4:32 PM 03/07/2014  3:54 PM Full Code [id number]  Lonn Georgia, PA-C ED    Questions for Most Recent Historical Code Status (Order [id number])    Question Answer Comment   In the event of cardiac or respiratory ARREST Do not call a "code blue"    In the event of cardiac or respiratory ARREST Do not perform Intubation, CPR, defibrillation or ACLS    In the event of cardiac or respiratory ARREST Use medication by any route, position, wound care, and other measures to relive pain and suffering. May use oxygen, suction and manual treatment of airway obstruction as needed for comfort.     Advance Directive Documentation        Most Recent Value   Type of Advance Directive  Out of facility DNR (pink MOST or yellow form)   Pre-existing out of facility DNR order (yellow form or pink MOST form)     "MOST" Form in Place?         Chief Complaint  Patient presents with  . Medical Management of Chronic Issues    HISTORY OF PRESENT ILLNESS:   This is a 82 year old male who is being seen for a routine visit. He had a fistulogram on 11/02/15. He continues to have HD 3X/week.  He has been admitted to [facility] on 09/29/15 from [facility]. He hasPMH of CAD S/P CABG, bradycardia, CKD stage IV, hypothyroidism and atrial fibrillation. He was having progressive cough, dyspnea and BLE edema with R>L. He was found to have chronic combined systolic and diastolic CHF.   Initially was diuresed with lasix drip + metolazone but due to poor response and worsening renal function,  Nephrology was consulted. He started on HD. Volume status improved with HD. Overall 31 pounds removed. He had UTI discharged with Amoxicillin 500 mg Q HS and will complete on 10/01/15. He was followed-up by Palliative care.  He has been admitted for a short-term rehabilitation.  PAST MEDICAL HISTORY:  Past Medical History  Diagnosis Date  . Hypertension   . Hyperlipidemia   . Hypothyroidism   . CAD (coronary artery disease)     a. prior CABG in 1997 with redo in 2000. b. last cath in 2008 - managed medically  . Type 2 diabetes mellitus ([place])   . Generalized weakness     without overt findings  . Peripheral vascular disease ([place])   . Carotid disease, bilateral ([place])     with multiple bilateral carotid surgeries  . Psoriasis   . Meniere's disease   . Degenerative joint disease   .  Gout   . Orthopnea     Two-pillow  . CKD (chronic kidney disease), stage IV ([place])     a. Has fistula in place.   . Chronic diastolic CHF (congestive heart failure) ([place])   . GERD (gastroesophageal reflux disease)   . Atrial fibrillation ([place]) Aug. 2015    a. Dx 01/2014, s/p DCCV 03/06/14.  . Sinus bradycardia     a. Toprol D/C'd due to this.   . Shingles   . On home oxygen therapy     "2L" qhs (09/09/2015  . Dementia   . Chronic respiratory failure with hypoxia ([facility])      CURRENT MEDICATIONS: Reviewed  Patient's Medications  New Prescriptions   No medications on file  Previous Medications   ACETAMINOPHEN (TYLENOL) 325 MG TABLET    Take 2 tablets (650 mg total) by mouth every 4 (four) hours as needed for headache or mild pain.   ALBUTEROL (PROVENTIL) (2.5 MG/3ML)  0.083% NEBULIZER SOLUTION    Take 3 mLs (2.5 mg total) by nebulization every 6 (six) hours as needed for wheezing or shortness of breath.   ATORVASTATIN (LIPITOR) 80 MG TABLET    TAKE ONE TABLET BY MOUTH ONCE DAILY   BISACODYL (DULCOLAX) 10 MG SUPPOSITORY    Place 10 mg rectally daily as needed for moderate constipation.   CHOLECALCIFEROL (VITAMIN D) 1000 UNITS TABLET    Take 1,000 Units by mouth daily at 6 PM. D3   GERIATRIC MULTIVITAMINS-MINERALS (ELDERTONIC/GEVRABON) ELIX    Take 15 mLs by mouth 2 (two) times daily.    GUAIFENESIN (ROBITUSSIN) 100 MG/5ML SYRUP    Take 10 mLs (200 mg total) by mouth every 6 (six) hours as needed for cough or congestion.   INSULIN GLARGINE (LANTUS SOLOSTAR) 100 UNIT/ML SOPN    Inject 24 Units into the skin daily.    INSULIN LISPRO (HUMALOG) 100 UNIT/ML INJECTION    Inject 5 Units into the skin 2 (two) times daily. If CBG >150   ISOSORBIDE MONONITRATE (IMDUR) 60 MG 24 HR TABLET    Take 1 tablet (60 mg total) by mouth at bedtime.   LEVOTHYROXINE (SYNTHROID, LEVOTHROID) 175 MCG TABLET    Take 175 mcg by mouth daily before breakfast.   MEMANTINE (NAMENDA) 5 MG TABLET    Take 5 mg by mouth daily.    MULTIPLE VITAMIN (MULTIVITAMIN) TABLET    Take 1 tablet by mouth at bedtime.   MULTIPLE VITAMINS-MINERALS (DECUBI-VITE) CAPS    Take 1 capsule by mouth daily.   ONDANSETRON (ZOFRAN) 4 MG TABLET    Take 4 mg by mouth every 6 (six) hours as needed for nausea or vomiting.   OXYGEN    Inhale 4 L/min into the lungs continuous. To keep O2 sats >90   PANTOPRAZOLE (PROTONIX) 40 MG TABLET    Take 1 tablet (40 mg total) by mouth daily at 6 (six) AM.   POLYETHYLENE GLYCOL (MIRALAX / GLYCOLAX) PACKET    Take 17 g by mouth 2 (two) times daily.   PROTEIN (PROCEL) POWD    Take 2 scoop by mouth 2 (two) times daily.   SENNOSIDES-DOCUSATE SODIUM (SENOKOT-S) 8.6-50 MG TABLET    Take 2 tablets by mouth 2 (two) times daily.   SEVELAMER CARBONATE (RENVELA) 2.4 G PACK    Take 2.4 g by mouth 3  (three) times daily.   WARFARIN SODIUM (COUMADIN PO)    Take by mouth daily. Give 10 mg po x1 11/15/15, then Coumadin 6 mg po qd  Modified Medications   No medications on file  Discontinued Medications   BENZONATATE (TESSALON) 100 MG CAPSULE    Take 1 capsule (100 mg total) by mouth 2 (two) times daily.   WARFARIN (COUMADIN) 2.5 MG TABLET    Take 2.5 mg by mouth daily. Take 2.5 mg M-F   WARFARIN (COUMADIN) 3 MG TABLET    Take 3 mg by mouth daily. Take on Saturday and Sunday     Allergies  Allergen Reactions  . Betadine [Povidone Iodine] Rash  . Iodinated Diagnostic Agents Rash and Other (See Comments)    Does fine with premedications for cath  . Latex Hives     REVIEW OF SYSTEMS:  GENERAL: no change in appetite, no fatigue, no weight changes, no fever, chills or weakness EYES: Denies change in vision, dry eyes, eye pain, itching or discharge EARS: Denies change in hearing, ringing in ears, or earache NOSE: Denies nasal congestion or epistaxis MOUTH and THROAT: Denies oral discomfort, gingival pain or bleeding, pain from teeth or hoarseness   RESPIRATORY: no cough, SOB, DOE, wheezing, hemoptysis CARDIAC: no chest pain, or palpitations, +edema GI: no abdominal pain, diarrhea, heart burn, nausea or vomiting, +constipation GU: Denies dysuria, frequency, hematuria, incontinence, or discharge PSYCHIATRIC: Denies feeling of depression or anxiety. No report of hallucinations, insomnia, paranoia, or agitation   PHYSICAL EXAMINATION  GENERAL APPEARANCE: Well nourished. In no acute distress. Normal body habitus SKIN:  Skin is warm and dry. Left heel has unstageable ulcer HEAD: Normal in size and contour. No evidence of trauma EYES: Lids open and close normally. No blepharitis, entropion or ectropion. PERRL. Conjunctivae are clear and sclerae are white. Lenses are without opacity EARS: Pinnae are normal. Patient hears normal voice tunes of the examiner MOUTH and THROAT: Lower lip with dry  crusty lesions. Oral mucosa is moist and without lesions. Tongue is normal in shape, size, and color and without lesions NECK: supple, trachea midline, no neck masses, no thyroid tenderness, no thyromegaly LYMPHATICS: no LAN in the neck, no supraclavicular LAN RESPIRATORY: breathing is even & unlabored, BS CTAB CARDIAC: RRR, + murmur,no extra heart sounds, LUE edema 2+ GI: abdomen soft, normal BS, no masses, no tenderness, no hepatomegaly, no splenomegaly GU:  Has condom catheter attached to urine bag EXTREMITIES:  Able to move X 4 extremities; has prevalon boots on, has left upper arm AV fistula PSYCHIATRIC: Alert and oriented X 3. Affect and behavior are appropriate  LABS/RADIOLOGY: Labs reviewed: Basic Metabolic Panel:  Recent Labs  09/09/15 1542  09/17/15 0540  09/21/15 0951  09/24/15 0702  09/27/15 0540 09/28/15 0600  09/29/15 0311 10/01/15 10/12/15  NA 135  < > 136  < > 137  < > 134*  < > 127* 132*  < > 131* 133* 138  K 4.3  < > 3.8  < > 4.4  < > 4.1  < > 4.9 4.5  --  4.6 4.3 3.6  CL 99*  < > 95*  < > 97*  < > 98*  < > 90* 94*  --  94*  --   --   CO2 22  < > 30  < > 27  < > 23  < > 28 29  --  26  --   --   GLUCOSE 121*  < > 207*  < > 148*  < > 161*  < > 189* 173*  --  155*  --   --   BUN 83*  < > 93*  < >  57*  < > 58*  < > 57* 40*  < > 56* 46* 23*  CREATININE 3.27*  < > 3.19*  < > 4.34*  < > 5.92*  < > 6.10* 4.38*  < > 4.95* 3.8* 3.2*  CALCIUM 8.6*  < > 8.8*  < > 8.4*  < > 8.4*  < > 8.1* 8.0*  --  8.1*  --   --   MG 2.5*  --  2.2  --   --   --   --   --   --   --   --   --   --   --   PHOS  --   --  5.3*  --  6.0*  --  6.4*  --   --   --   --   --   --   --   < > = values in this interval not displayed. Liver Function Tests:  Recent Labs  09/09/15 1326 09/09/15 1542 09/17/15 0540 09/21/15 0951 09/24/15 0702  AST 63* 60*  --  66*  --   ALT 63 67*  --  69*  --   ALKPHOS 74 75  --  103  --   BILITOT 0.7 0.7  --  1.5*  --   PROT 6.0* 6.3*  --  6.7  --   ALBUMIN 3.4*  3.6 2.9* 2.6* 2.4*   CBC:  Recent Labs  09/09/15 1542  09/24/15 0702 09/27/15 0540 09/29/15 09/29/15 1518 10/01/15  WBC 6.1  < > 7.5 8.2 6.0 6.0 6.8  NEUTROABS 5.5  --   --   --   --   --  5  HGB 10.8*  < > 10.3* 9.6*  --  9.3* 10.3*  HCT 34.6*  < > 33.0* 32.1*  --  31.0* 35*  MCV 89.2  < > 89.9 90.2  --  90.1  --   PLT 120*  < > 129* 155  --  157 182  < > = values in this interval not displayed.  Cardiac Enzymes:  Recent Labs  09/09/15 1542 09/09/15 2140 09/10/15 0337  TROPONINI 0.03 0.03 0.04*   CBG:  Recent Labs  09/29/15 0651 09/29/15 1119 09/29/15 1950  GLUCAP 156* 232* 167*       ASSESSMENT/PLAN:  Generalized weakness - continue  rehabilitation  Chronic diastolic CHF - EF [id number]; continue on HD; follow-up with cardiovascular, Dr. Daneen Schick, weigh daily  ESRD - started HD on 09/18/15; will have HD M[facility]F @ [facility]  CAD S/P CABG -  continue atorvastatin 80 mg daily and Imdur ER 60 mg 1 tab by mouth daily at bedtime  Atrial fibrillation - rate controlled; continue Coumadin; off beta blocker due to bradycardia   Hypothyroidism - tsh 1.545; continue levothyroxine 175 g 1 tab by mouth daily Lab Results  Component Value Date   TSH 1.545 09/09/2015    Hypertension - stable; off antihypertensives now that he is getting HD; BP/HR BID X 1 week  Anemia, iron deficiency  - given iron in the hospital; hemoglobin 9.6; re-checked  hgb 10.3, improved  Dementia - continue Namenda 5 mg 1 tab by mouth daily  Protein calorie malnutrition, severe - albumin 2.4; continue Procel 2 scoops by mouth twice a day  Chronic respiratory failure - continue O2 at 2 L/minute via [place] when necessary to keep O2 sat > 88%  Dysphagia - continue CCD with thin liquids; aspiration precaution  Diabetes mellitus, type II with  vascular complication -  continue Lantus 15 units subcutaneous Q AM ;   Lab Results  Component Value Date   HGBA1C 6.3 10/01/2015     Constipation - continue MiraLAX 17 g by mouth twice a day,  senna S  2 tabs by mouth twice a day and Dulcolax suppository 10 mg 1 per rectum daily when necessary  Poor appetite - continue Procel supplementation and Eldertonic 15 ml PO BID  Pressure ulcer on left heel - wound treatment daily, air mattress overlay; continue Decubivite 1 tab PO daily  GERD - continue Protonix 40 mg daily    Goals of care:  Short-term rehabilitation    Durenda Age, NP [place]

## 2015-11-05 DIAGNOSIS — D631 Anemia in chronic kidney disease: Secondary | ICD-10-CM | POA: Diagnosis not present

## 2015-11-05 DIAGNOSIS — E1129 Type 2 diabetes mellitus with other diabetic kidney complication: Secondary | ICD-10-CM | POA: Diagnosis not present

## 2015-11-05 DIAGNOSIS — E1122 Type 2 diabetes mellitus with diabetic chronic kidney disease: Secondary | ICD-10-CM | POA: Diagnosis not present

## 2015-11-05 DIAGNOSIS — N186 End stage renal disease: Secondary | ICD-10-CM | POA: Diagnosis not present

## 2015-11-05 DIAGNOSIS — D509 Iron deficiency anemia, unspecified: Secondary | ICD-10-CM | POA: Diagnosis not present

## 2015-11-08 DIAGNOSIS — E1122 Type 2 diabetes mellitus with diabetic chronic kidney disease: Secondary | ICD-10-CM | POA: Diagnosis not present

## 2015-11-08 DIAGNOSIS — D631 Anemia in chronic kidney disease: Secondary | ICD-10-CM | POA: Diagnosis not present

## 2015-11-08 DIAGNOSIS — D509 Iron deficiency anemia, unspecified: Secondary | ICD-10-CM | POA: Diagnosis not present

## 2015-11-08 DIAGNOSIS — N186 End stage renal disease: Secondary | ICD-10-CM | POA: Diagnosis not present

## 2015-11-08 DIAGNOSIS — E1129 Type 2 diabetes mellitus with other diabetic kidney complication: Secondary | ICD-10-CM | POA: Diagnosis not present

## 2015-11-09 ENCOUNTER — Non-Acute Institutional Stay (SKILLED_NURSING_FACILITY): Payer: Medicare Other | Admitting: Adult Health

## 2015-11-09 ENCOUNTER — Encounter: Payer: Self-pay | Admitting: Adult Health

## 2015-11-09 DIAGNOSIS — Z7901 Long term (current) use of anticoagulants: Secondary | ICD-10-CM | POA: Diagnosis not present

## 2015-11-09 DIAGNOSIS — I480 Paroxysmal atrial fibrillation: Secondary | ICD-10-CM | POA: Diagnosis not present

## 2015-11-09 NOTE — Progress Notes (Signed)
Patient ID: [name], male   DOB: 1934-01-04, 82 y.o.   MRN: [id number] Subjective:     Indication: atrial fibrillation Bleeding signs/symptoms: None Thromboembolic signs/symptoms: None  Missed Coumadin doses: None Medication changes: no Dietary changes: no Bacterial/viral infection: no Other concerns: no  The following portions of the patient's history were reviewed and updated as appropriate: allergies, current medications, past family history, past medical history, past social history, past surgical history and problem list.  Review of Systems A comprehensive review of systems was negative.   Objective:    INR Today: 1.4 Current dose: Coumadin 2.5 mg     Assessment:    Subtherapeutic INR for goal of 2-3   Plan:    1. New dose: Coumadin 5 mg PO X 1 today then Coumadin 3 mg PO daily   2. Next INR: 11/12/15

## 2015-11-10 DIAGNOSIS — E119 Type 2 diabetes mellitus without complications: Secondary | ICD-10-CM | POA: Diagnosis not present

## 2015-11-10 DIAGNOSIS — D509 Iron deficiency anemia, unspecified: Secondary | ICD-10-CM | POA: Diagnosis not present

## 2015-11-10 DIAGNOSIS — B351 Tinea unguium: Secondary | ICD-10-CM | POA: Diagnosis not present

## 2015-11-10 DIAGNOSIS — M79671 Pain in right foot: Secondary | ICD-10-CM | POA: Diagnosis not present

## 2015-11-10 DIAGNOSIS — D631 Anemia in chronic kidney disease: Secondary | ICD-10-CM | POA: Diagnosis not present

## 2015-11-10 DIAGNOSIS — E1129 Type 2 diabetes mellitus with other diabetic kidney complication: Secondary | ICD-10-CM | POA: Diagnosis not present

## 2015-11-10 DIAGNOSIS — N186 End stage renal disease: Secondary | ICD-10-CM | POA: Diagnosis not present

## 2015-11-10 DIAGNOSIS — E1122 Type 2 diabetes mellitus with diabetic chronic kidney disease: Secondary | ICD-10-CM | POA: Diagnosis not present

## 2015-11-10 DIAGNOSIS — I739 Peripheral vascular disease, unspecified: Secondary | ICD-10-CM | POA: Diagnosis not present

## 2015-11-12 DIAGNOSIS — E1129 Type 2 diabetes mellitus with other diabetic kidney complication: Secondary | ICD-10-CM | POA: Diagnosis not present

## 2015-11-12 DIAGNOSIS — D509 Iron deficiency anemia, unspecified: Secondary | ICD-10-CM | POA: Diagnosis not present

## 2015-11-12 DIAGNOSIS — D631 Anemia in chronic kidney disease: Secondary | ICD-10-CM | POA: Diagnosis not present

## 2015-11-12 DIAGNOSIS — E1122 Type 2 diabetes mellitus with diabetic chronic kidney disease: Secondary | ICD-10-CM | POA: Diagnosis not present

## 2015-11-12 DIAGNOSIS — N186 End stage renal disease: Secondary | ICD-10-CM | POA: Diagnosis not present

## 2015-11-15 ENCOUNTER — Non-Acute Institutional Stay (SKILLED_NURSING_FACILITY): Payer: Medicare Other | Admitting: Adult Health

## 2015-11-15 ENCOUNTER — Encounter: Payer: Self-pay | Admitting: Adult Health

## 2015-11-15 DIAGNOSIS — L8990 Pressure ulcer of unspecified site, unspecified stage: Secondary | ICD-10-CM | POA: Diagnosis not present

## 2015-11-15 DIAGNOSIS — E039 Hypothyroidism, unspecified: Secondary | ICD-10-CM

## 2015-11-15 DIAGNOSIS — F0390 Unspecified dementia without behavioral disturbance: Secondary | ICD-10-CM

## 2015-11-15 DIAGNOSIS — R630 Anorexia: Secondary | ICD-10-CM

## 2015-11-15 DIAGNOSIS — R531 Weakness: Secondary | ICD-10-CM

## 2015-11-15 DIAGNOSIS — Z7901 Long term (current) use of anticoagulants: Secondary | ICD-10-CM

## 2015-11-15 DIAGNOSIS — I2510 Atherosclerotic heart disease of native coronary artery without angina pectoris: Secondary | ICD-10-CM

## 2015-11-15 DIAGNOSIS — Z992 Dependence on renal dialysis: Secondary | ICD-10-CM

## 2015-11-15 DIAGNOSIS — K219 Gastro-esophageal reflux disease without esophagitis: Secondary | ICD-10-CM

## 2015-11-15 DIAGNOSIS — D509 Iron deficiency anemia, unspecified: Secondary | ICD-10-CM

## 2015-11-15 DIAGNOSIS — I480 Paroxysmal atrial fibrillation: Secondary | ICD-10-CM

## 2015-11-15 DIAGNOSIS — J9611 Chronic respiratory failure with hypoxia: Secondary | ICD-10-CM

## 2015-11-15 DIAGNOSIS — D631 Anemia in chronic kidney disease: Secondary | ICD-10-CM | POA: Diagnosis not present

## 2015-11-15 DIAGNOSIS — E1151 Type 2 diabetes mellitus with diabetic peripheral angiopathy without gangrene: Secondary | ICD-10-CM

## 2015-11-15 DIAGNOSIS — E43 Unspecified severe protein-calorie malnutrition: Secondary | ICD-10-CM

## 2015-11-15 DIAGNOSIS — N186 End stage renal disease: Secondary | ICD-10-CM | POA: Diagnosis not present

## 2015-11-15 DIAGNOSIS — K5901 Slow transit constipation: Secondary | ICD-10-CM

## 2015-11-15 DIAGNOSIS — I5032 Chronic diastolic (congestive) heart failure: Secondary | ICD-10-CM

## 2015-11-15 DIAGNOSIS — R1310 Dysphagia, unspecified: Secondary | ICD-10-CM | POA: Diagnosis not present

## 2015-11-15 DIAGNOSIS — E1122 Type 2 diabetes mellitus with diabetic chronic kidney disease: Secondary | ICD-10-CM | POA: Diagnosis not present

## 2015-11-15 DIAGNOSIS — E1129 Type 2 diabetes mellitus with other diabetic kidney complication: Secondary | ICD-10-CM | POA: Diagnosis not present

## 2015-11-15 DIAGNOSIS — I10 Essential (primary) hypertension: Secondary | ICD-10-CM

## 2015-11-15 NOTE — Progress Notes (Signed)
Patient ID: [name], male   DOB: 1934-04-11, 82 y.o.   MRN: [id number]    DATE:  11/15/15  MRN:  [id number]  BIRTHDAY: April 05, 1934  Facility:  Nursing Home Location:  [place] Room Number: 902-P  LEVEL OF CARE:  SNF (31)  Contact Information    Name Relation Home Work [place] Spouse [phone]  [phone]   Peter, [name]   [phone]       Code Status History    Date Active Date Inactive Code Status Order ID Comments User Context   09/23/2015  1:26 PM 09/29/2015 11:10 PM DNR [id number]  Micheline Rough, MD Inpatient   09/09/2015  3:29 PM 09/23/2015  1:26 PM Full Code [id number]  Belva Crome, MD ED   03/24/2014 12:41 AM 03/24/2014  6:34 PM Full Code [id number]  Shanda Howells, MD ED   03/04/2014  4:32 PM 03/07/2014  3:54 PM Full Code [id number]  Lonn Georgia, PA-C ED    Questions for Most Recent Historical Code Status (Order [id number])    Question Answer Comment   In the event of cardiac or respiratory ARREST Do not call a "code blue"    In the event of cardiac or respiratory ARREST Do not perform Intubation, CPR, defibrillation or ACLS    In the event of cardiac or respiratory ARREST Use medication by any route, position, wound care, and other measures to relive pain and suffering. May use oxygen, suction and manual treatment of airway obstruction as needed for comfort.     Advance Directive Documentation        Most Recent Value   Type of Advance Directive  Out of facility DNR (pink MOST or yellow form)   Pre-existing out of facility DNR order (yellow form or pink MOST form)     "MOST" Form in Place?         Chief Complaint  Patient presents with  . Discharge Note    HISTORY OF PRESENT ILLNESS:   This is a 82 year old male who is for discharge home with Home health PT, OT, CNA, SW and ST. DME:  Standard wheelchair with elevating leg rests, cushion, anti-tippers, bedside commode and portable standard oxygen (gas) @  2L/min via [place].  He had a fistulogram on 11/02/15. He continues to have HD 3X/week. Latest INR 1.4, subtherapeutic.  He has been admitted to [facility] on 09/29/15 from [facility]. He hasPMH of CAD S/P CABG, bradycardia, CKD stage IV, hypothyroidism and atrial fibrillation. He was having progressive cough, dyspnea and BLE edema with R>L. He was found to have chronic combined systolic and diastolic CHF.   Initially was diuresed with lasix drip + metolazone but due to poor response and worsening renal function,  Nephrology was consulted. He started on HD. Volume status improved with HD. Overall 31 pounds removed. He had UTI discharged with Amoxicillin 500 mg Q HS and will complete on 10/01/15. He was followed-up by Palliative care.  Patient was admitted to this facility for short-term rehabilitation after the patient's recent hospitalization.  Patient has completed SNF rehabilitation and therapy has cleared the patient for discharge.  PAST MEDICAL HISTORY:  Past Medical History  Diagnosis Date  . Hypertension   . Hyperlipidemia   . Hypothyroidism   . CAD (coronary artery disease)     a. prior CABG in 1997 with redo in 2000. b. last cath in 2008 - managed medically  . Type 2 diabetes mellitus ([place])   .  Generalized weakness     without overt findings  . Peripheral vascular disease ([place])   . Carotid disease, bilateral ([place])     with multiple bilateral carotid surgeries  . Psoriasis   . Meniere's disease   . Degenerative joint disease   . Gout   . Orthopnea     Two-pillow  . CKD (chronic kidney disease), stage IV ([place])     a. Has fistula in place.   . Chronic diastolic CHF (congestive heart failure) ([place])   . GERD (gastroesophageal reflux disease)   . Atrial fibrillation ([place]) Aug. 2015    a. Dx 01/2014, s/p DCCV 03/06/14.  . Sinus bradycardia     a. Toprol D/C'd due to this.   . Shingles   . On home oxygen therapy     "2L" qhs (09/09/2015  . Dementia   . Chronic respiratory  failure with hypoxia ([facility])      CURRENT MEDICATIONS: Reviewed  Patient's Medications  New Prescriptions   No medications on file  Previous Medications   ACETAMINOPHEN (TYLENOL) 325 MG TABLET    Take 2 tablets (650 mg total) by mouth every 4 (four) hours as needed for headache or mild pain.   ALBUTEROL (PROVENTIL) (2.5 MG/3ML) 0.083% NEBULIZER SOLUTION    Take 3 mLs (2.5 mg total) by nebulization every 6 (six) hours as needed for wheezing or shortness of breath.   ATORVASTATIN (LIPITOR) 80 MG TABLET    TAKE ONE TABLET BY MOUTH ONCE DAILY   BISACODYL (DULCOLAX) 10 MG SUPPOSITORY    Place 10 mg rectally daily as needed for moderate constipation.   CHOLECALCIFEROL (VITAMIN D) 1000 UNITS TABLET    Take 1,000 Units by mouth daily at 6 PM. D3   GERIATRIC MULTIVITAMINS-MINERALS (ELDERTONIC/GEVRABON) ELIX    Take 15 mLs by mouth 2 (two) times daily.    GUAIFENESIN (ROBITUSSIN) 100 MG/5ML SYRUP    Take 10 mLs (200 mg total) by mouth every 6 (six) hours as needed for cough or congestion.   INSULIN GLARGINE (LANTUS SOLOSTAR) 100 UNIT/ML SOPN    Inject 24 Units into the skin daily.    INSULIN LISPRO (HUMALOG) 100 UNIT/ML INJECTION    Inject 5 Units into the skin 2 (two) times daily. If CBG >150   ISOSORBIDE MONONITRATE (IMDUR) 60 MG 24 HR TABLET    Take 1 tablet (60 mg total) by mouth at bedtime.   LEVOTHYROXINE (SYNTHROID, LEVOTHROID) 175 MCG TABLET    Take 175 mcg by mouth daily before breakfast.   MEMANTINE (NAMENDA) 5 MG TABLET    Take 5 mg by mouth daily.    MULTIPLE VITAMIN (MULTIVITAMIN) TABLET    Take 1 tablet by mouth at bedtime.   MULTIPLE VITAMINS-MINERALS (DECUBI-VITE) CAPS    Take 1 capsule by mouth daily.   ONDANSETRON (ZOFRAN) 4 MG TABLET    Take 4 mg by mouth every 6 (six) hours as needed for nausea or vomiting.   OXYGEN    Inhale 4 L/min into the lungs continuous. To keep O2 sats >90   PANTOPRAZOLE (PROTONIX) 40 MG TABLET    Take 1 tablet (40 mg total) by mouth daily at 6 (six) AM.    POLYETHYLENE GLYCOL (MIRALAX / GLYCOLAX) PACKET    Take 17 g by mouth 2 (two) times daily.   PROTEIN (PROCEL) POWD    Take 2 scoop by mouth 2 (two) times daily.   SENNOSIDES-DOCUSATE SODIUM (SENOKOT-S) 8.6-50 MG TABLET    Take 2 tablets by mouth 2 (two)  times daily.   SEVELAMER CARBONATE (RENVELA) 2.4 G PACK    Take 2.4 g by mouth 3 (three) times daily.   WARFARIN SODIUM (COUMADIN PO)    Take by mouth daily. Give 10 mg po x1 11/15/15, then Coumadin 6 mg po qd  Modified Medications   No medications on file  Discontinued Medications   No medications on file     Allergies  Allergen Reactions  . Betadine [Povidone Iodine] Rash  . Iodinated Diagnostic Agents Rash and Other (See Comments)    Does fine with premedications for cath  . Latex Hives     REVIEW OF SYSTEMS:  GENERAL: no change in appetite, no fatigue, no weight changes, no fever, chills or weakness EYES: Denies change in vision, dry eyes, eye pain, itching or discharge EARS: Denies change in hearing, ringing in ears, or earache NOSE: Denies nasal congestion or epistaxis MOUTH and THROAT: Denies oral discomfort, gingival pain or bleeding, pain from teeth or hoarseness   RESPIRATORY: no cough, SOB, DOE, wheezing, hemoptysis CARDIAC: no chest pain, or palpitations GI: no abdominal pain, diarrhea, heart burn, nausea or vomiting, +constipation GU: Denies dysuria, frequency, hematuria, incontinence, or discharge PSYCHIATRIC: Denies feeling of depression or anxiety. No report of hallucinations, insomnia, paranoia, or agitation   PHYSICAL EXAMINATION  GENERAL APPEARANCE: Well nourished. In no acute distress. Normal body habitus SKIN:  Skin is warm and dry. Left heel has unstageable ulcer HEAD: Normal in size and contour. No evidence of trauma EYES: Lids open and close normally. No blepharitis, entropion or ectropion. PERRL. Conjunctivae are clear and sclerae are white. Lenses are without opacity EARS: Pinnae are normal. Patient  hears normal voice tunes of the examiner MOUTH and THROAT: Lower lip with dry crusty lesions. Oral mucosa is moist and without lesions. Tongue is normal in shape, size, and color and without lesions NECK: supple, trachea midline, no neck masses, no thyroid tenderness, no thyromegaly LYMPHATICS: no LAN in the neck, no supraclavicular LAN RESPIRATORY: breathing is even & unlabored, BS CTAB CARDIAC: RRR, + murmur,no extra heart sounds GI: abdomen soft, normal BS, no masses, no tenderness, no hepatomegaly, no splenomegaly EXTREMITIES:  Able to move X 4 extremities; has prevalon boots on, has left upper arm AV fistula PSYCHIATRIC: Alert and oriented X 3. Affect and behavior are appropriate  LABS/RADIOLOGY: Labs reviewed: Basic Metabolic Panel:  Recent Labs  09/09/15 1542  09/17/15 0540  09/21/15 0951  09/24/15 0702  09/27/15 0540 09/28/15 0600  09/29/15 0311 10/01/15 10/12/15  NA 135  < > 136  < > 137  < > 134*  < > 127* 132*  < > 131* 133* 138  K 4.3  < > 3.8  < > 4.4  < > 4.1  < > 4.9 4.5  --  4.6 4.3 3.6  CL 99*  < > 95*  < > 97*  < > 98*  < > 90* 94*  --  94*  --   --   CO2 22  < > 30  < > 27  < > 23  < > 28 29  --  26  --   --   GLUCOSE 121*  < > 207*  < > 148*  < > 161*  < > 189* 173*  --  155*  --   --   BUN 83*  < > 93*  < > 57*  < > 58*  < > 57* 40*  < > 56* 46* 23*  CREATININE 3.27*  < >  3.19*  < > 4.34*  < > 5.92*  < > 6.10* 4.38*  < > 4.95* 3.8* 3.2*  CALCIUM 8.6*  < > 8.8*  < > 8.4*  < > 8.4*  < > 8.1* 8.0*  --  8.1*  --   --   MG 2.5*  --  2.2  --   --   --   --   --   --   --   --   --   --   --   PHOS  --   --  5.3*  --  6.0*  --  6.4*  --   --   --   --   --   --   --   < > = values in this interval not displayed. Liver Function Tests:  Recent Labs  09/09/15 1326 09/09/15 1542 09/17/15 0540 09/21/15 0951 09/24/15 0702  AST 63* 60*  --  66*  --   ALT 63 67*  --  69*  --   ALKPHOS 74 75  --  103  --   BILITOT 0.7 0.7  --  1.5*  --   PROT 6.0* 6.3*  --  6.7  --    ALBUMIN 3.4* 3.6 2.9* 2.6* 2.4*   CBC:  Recent Labs  09/09/15 1542  09/24/15 0702 09/27/15 0540 09/29/15 09/29/15 1518 10/01/15  WBC 6.1  < > 7.5 8.2 6.0 6.0 6.8  NEUTROABS 5.5  --   --   --   --   --  5  HGB 10.8*  < > 10.3* 9.6*  --  9.3* 10.3*  HCT 34.6*  < > 33.0* 32.1*  --  31.0* 35*  MCV 89.2  < > 89.9 90.2  --  90.1  --   PLT 120*  < > 129* 155  --  157 182  < > = values in this interval not displayed.  Cardiac Enzymes:  Recent Labs  09/09/15 1542 09/09/15 2140 09/10/15 0337  TROPONINI 0.03 0.03 0.04*   CBG:  Recent Labs  09/29/15 0651 09/29/15 1119 09/29/15 1950  GLUCAP 156* 232* 167*       ASSESSMENT/PLAN:  Generalized weakness - For home health PT, OT, CNA, SW and ST  Chronic diastolic CHF - EF [id number], no sob; continue on HD; follow-up with cardiovascular, Dr. Daneen Schick    ESRD - started HD on 09/18/15; will have HD M-W-F @ [facility]  CAD S/P CABG -  continue atorvastatin 80 mg daily and Imdur ER 60 mg 1 tab by mouth daily at bedtime  Atrial fibrillation - rate controlled; continue Coumadin; off beta blocker due to bradycardia   Long-term use of anticoagulant - INR 1.4, subtherapeutic; give Coumadin 10 mg by mouth 1 today then Coumadin 6 mg by mouth daily; INR 11/17/15  Hypothyroidism - tsh 1.545; continue levothyroxine 175 g 1 tab by mouth daily Lab Results  Component Value Date   TSH 1.545 09/09/2015    Hypertension - stable; off antihypertensives now that he is getting HD  Anemia, iron deficiency  - given iron in the hospital; hemoglobin 9.6; re-checked  hgb 10.3, improved  Dementia - continue Namenda 5 mg 1 tab by mouth daily  Protein calorie malnutrition, severe - albumin 2.4; continue Procel 2 scoops by mouth twice a day  Chronic respiratory failure - continue O2 at 2 L/minute via [place] when necessary to keep O2 sat > 88%  Dysphagia - continue CCD with thin liquids; aspiration  precaution  Diabetes mellitus,  type II with vascular complication -  continue Lantus 15 units subcutaneous Q AM ;   Lab Results  Component Value Date   HGBA1C 6.3 10/01/2015    Constipation - continue MiraLAX 17 g by mouth twice a day,  senna S  2 tabs by mouth twice a day and Dulcolax suppository 10 mg 1 per rectum daily when necessary  Poor appetite - continue Procel supplementation and Eldertonic 15 ml PO BID  Pressure ulcer on left heel - wound treatment daily, air mattress overlay; continue Decubivite 1 tab PO daily  GERD - continue Protonix 40 mg daily     I have filled out patient's discharge paperwork and written prescriptions.  Patient will receive home health PT, OT, ST, SW and CNA.  DME provided:  Standard wheelchair with elevating leg rests, cushion, anti-tippers, bedside commode and portable standard oxygen (gas) @ 2L/min via [place]  Total discharge time: Greater than 30 minutes  Discharge time involved coordination of the discharge process with social worker, nursing staff and therapy department. Medical justification for home health services/DME verified.     Durenda Age, NP [employer] [phone]

## 2015-11-17 DIAGNOSIS — D631 Anemia in chronic kidney disease: Secondary | ICD-10-CM | POA: Diagnosis not present

## 2015-11-17 DIAGNOSIS — N186 End stage renal disease: Secondary | ICD-10-CM | POA: Diagnosis not present

## 2015-11-17 DIAGNOSIS — D509 Iron deficiency anemia, unspecified: Secondary | ICD-10-CM | POA: Diagnosis not present

## 2015-11-17 DIAGNOSIS — E1129 Type 2 diabetes mellitus with other diabetic kidney complication: Secondary | ICD-10-CM | POA: Diagnosis not present

## 2015-11-17 DIAGNOSIS — E1122 Type 2 diabetes mellitus with diabetic chronic kidney disease: Secondary | ICD-10-CM | POA: Diagnosis not present

## 2015-11-18 DIAGNOSIS — I129 Hypertensive chronic kidney disease with stage 1 through stage 4 chronic kidney disease, or unspecified chronic kidney disease: Secondary | ICD-10-CM | POA: Diagnosis not present

## 2015-11-18 DIAGNOSIS — Z992 Dependence on renal dialysis: Secondary | ICD-10-CM | POA: Diagnosis not present

## 2015-11-18 DIAGNOSIS — I4891 Unspecified atrial fibrillation: Secondary | ICD-10-CM | POA: Diagnosis not present

## 2015-11-18 DIAGNOSIS — I5042 Chronic combined systolic (congestive) and diastolic (congestive) heart failure: Secondary | ICD-10-CM | POA: Diagnosis not present

## 2015-11-18 DIAGNOSIS — E1151 Type 2 diabetes mellitus with diabetic peripheral angiopathy without gangrene: Secondary | ICD-10-CM | POA: Diagnosis not present

## 2015-11-18 DIAGNOSIS — E039 Hypothyroidism, unspecified: Secondary | ICD-10-CM | POA: Diagnosis not present

## 2015-11-18 DIAGNOSIS — L89620 Pressure ulcer of left heel, unstageable: Secondary | ICD-10-CM | POA: Diagnosis not present

## 2015-11-18 DIAGNOSIS — R1310 Dysphagia, unspecified: Secondary | ICD-10-CM | POA: Diagnosis not present

## 2015-11-18 DIAGNOSIS — Z794 Long term (current) use of insulin: Secondary | ICD-10-CM | POA: Diagnosis not present

## 2015-11-18 DIAGNOSIS — Z951 Presence of aortocoronary bypass graft: Secondary | ICD-10-CM | POA: Diagnosis not present

## 2015-11-18 DIAGNOSIS — F0390 Unspecified dementia without behavioral disturbance: Secondary | ICD-10-CM | POA: Diagnosis not present

## 2015-11-18 DIAGNOSIS — E43 Unspecified severe protein-calorie malnutrition: Secondary | ICD-10-CM | POA: Diagnosis not present

## 2015-11-18 DIAGNOSIS — N186 End stage renal disease: Secondary | ICD-10-CM | POA: Diagnosis not present

## 2015-11-18 DIAGNOSIS — E1122 Type 2 diabetes mellitus with diabetic chronic kidney disease: Secondary | ICD-10-CM | POA: Diagnosis not present

## 2015-11-18 DIAGNOSIS — I2510 Atherosclerotic heart disease of native coronary artery without angina pectoris: Secondary | ICD-10-CM | POA: Diagnosis not present

## 2015-11-19 ENCOUNTER — Telehealth: Payer: Self-pay | Admitting: Cardiovascular Disease

## 2015-11-19 DIAGNOSIS — E1129 Type 2 diabetes mellitus with other diabetic kidney complication: Secondary | ICD-10-CM | POA: Diagnosis not present

## 2015-11-19 DIAGNOSIS — N186 End stage renal disease: Secondary | ICD-10-CM | POA: Diagnosis not present

## 2015-11-19 DIAGNOSIS — D509 Iron deficiency anemia, unspecified: Secondary | ICD-10-CM | POA: Diagnosis not present

## 2015-11-19 DIAGNOSIS — E1122 Type 2 diabetes mellitus with diabetic chronic kidney disease: Secondary | ICD-10-CM | POA: Diagnosis not present

## 2015-11-19 DIAGNOSIS — D631 Anemia in chronic kidney disease: Secondary | ICD-10-CM | POA: Diagnosis not present

## 2015-11-19 NOTE — Telephone Encounter (Signed)
New Message  Pt c/o medication issue: 1. Name of Medication: Pt hasn't been on these medication since he left [facility]. Neither was he on them while he was there  1. Amiodarone  2. Amlodipine 3. Doxazosine  4. What is your medication issue? Pt wife request a call back to discuss he she should stop giving the pt theses pills because it wasn't listed on his d/c list from [facility].

## 2015-11-19 NOTE — Telephone Encounter (Signed)
Spoke with patient's wife who states patient came home from [facility] on Tuesday and she is wondering if he should restart cardiac medicines.  I advised her that it is difficult to change medications until we see him in the office.  The patient had a long hospitalization and rehab and she is unsure as to why the medications below were stopped.  I advised that patient should follow discharge instructions from [facility] and Durenda Age, NP until his appointment with [place].  She asked for a sooner appointment with Dr. Acie Fredrickson (can only take Tues or Thurs appointment) and I advised that there are no openings at present but that I will call her if something opens up. He is scheduled 6/22 with Cecilie Kicks, NP.

## 2015-11-20 DIAGNOSIS — I129 Hypertensive chronic kidney disease with stage 1 through stage 4 chronic kidney disease, or unspecified chronic kidney disease: Secondary | ICD-10-CM | POA: Diagnosis not present

## 2015-11-20 DIAGNOSIS — E1122 Type 2 diabetes mellitus with diabetic chronic kidney disease: Secondary | ICD-10-CM | POA: Diagnosis not present

## 2015-11-20 DIAGNOSIS — I5042 Chronic combined systolic (congestive) and diastolic (congestive) heart failure: Secondary | ICD-10-CM | POA: Diagnosis not present

## 2015-11-20 DIAGNOSIS — I2510 Atherosclerotic heart disease of native coronary artery without angina pectoris: Secondary | ICD-10-CM | POA: Diagnosis not present

## 2015-11-20 DIAGNOSIS — L89620 Pressure ulcer of left heel, unstageable: Secondary | ICD-10-CM | POA: Diagnosis not present

## 2015-11-20 DIAGNOSIS — N186 End stage renal disease: Secondary | ICD-10-CM | POA: Diagnosis not present

## 2015-11-22 DIAGNOSIS — D631 Anemia in chronic kidney disease: Secondary | ICD-10-CM | POA: Diagnosis not present

## 2015-11-22 DIAGNOSIS — N186 End stage renal disease: Secondary | ICD-10-CM | POA: Diagnosis not present

## 2015-11-22 DIAGNOSIS — D509 Iron deficiency anemia, unspecified: Secondary | ICD-10-CM | POA: Diagnosis not present

## 2015-11-22 DIAGNOSIS — E1122 Type 2 diabetes mellitus with diabetic chronic kidney disease: Secondary | ICD-10-CM | POA: Diagnosis not present

## 2015-11-22 DIAGNOSIS — E1129 Type 2 diabetes mellitus with other diabetic kidney complication: Secondary | ICD-10-CM | POA: Diagnosis not present

## 2015-11-23 DIAGNOSIS — N186 End stage renal disease: Secondary | ICD-10-CM | POA: Diagnosis not present

## 2015-11-23 DIAGNOSIS — E1122 Type 2 diabetes mellitus with diabetic chronic kidney disease: Secondary | ICD-10-CM | POA: Diagnosis not present

## 2015-11-23 DIAGNOSIS — I5042 Chronic combined systolic (congestive) and diastolic (congestive) heart failure: Secondary | ICD-10-CM | POA: Diagnosis not present

## 2015-11-23 DIAGNOSIS — I129 Hypertensive chronic kidney disease with stage 1 through stage 4 chronic kidney disease, or unspecified chronic kidney disease: Secondary | ICD-10-CM | POA: Diagnosis not present

## 2015-11-23 DIAGNOSIS — I2510 Atherosclerotic heart disease of native coronary artery without angina pectoris: Secondary | ICD-10-CM | POA: Diagnosis not present

## 2015-11-23 DIAGNOSIS — L89620 Pressure ulcer of left heel, unstageable: Secondary | ICD-10-CM | POA: Diagnosis not present

## 2015-11-24 DIAGNOSIS — D631 Anemia in chronic kidney disease: Secondary | ICD-10-CM | POA: Diagnosis not present

## 2015-11-24 DIAGNOSIS — E1129 Type 2 diabetes mellitus with other diabetic kidney complication: Secondary | ICD-10-CM | POA: Diagnosis not present

## 2015-11-24 DIAGNOSIS — N186 End stage renal disease: Secondary | ICD-10-CM | POA: Diagnosis not present

## 2015-11-24 DIAGNOSIS — D509 Iron deficiency anemia, unspecified: Secondary | ICD-10-CM | POA: Diagnosis not present

## 2015-11-24 DIAGNOSIS — Z992 Dependence on renal dialysis: Secondary | ICD-10-CM | POA: Diagnosis not present

## 2015-11-24 DIAGNOSIS — E1122 Type 2 diabetes mellitus with diabetic chronic kidney disease: Secondary | ICD-10-CM | POA: Diagnosis not present

## 2015-11-25 ENCOUNTER — Telehealth: Payer: Self-pay | Admitting: Interventional Cardiology

## 2015-11-25 DIAGNOSIS — N186 End stage renal disease: Secondary | ICD-10-CM | POA: Diagnosis not present

## 2015-11-25 DIAGNOSIS — E1122 Type 2 diabetes mellitus with diabetic chronic kidney disease: Secondary | ICD-10-CM | POA: Diagnosis not present

## 2015-11-25 DIAGNOSIS — L89620 Pressure ulcer of left heel, unstageable: Secondary | ICD-10-CM | POA: Diagnosis not present

## 2015-11-25 DIAGNOSIS — I129 Hypertensive chronic kidney disease with stage 1 through stage 4 chronic kidney disease, or unspecified chronic kidney disease: Secondary | ICD-10-CM | POA: Diagnosis not present

## 2015-11-25 DIAGNOSIS — I5042 Chronic combined systolic (congestive) and diastolic (congestive) heart failure: Secondary | ICD-10-CM | POA: Diagnosis not present

## 2015-11-25 DIAGNOSIS — I2510 Atherosclerotic heart disease of native coronary artery without angina pectoris: Secondary | ICD-10-CM | POA: Diagnosis not present

## 2015-11-25 NOTE — Telephone Encounter (Signed)
Spoke with [facility] nurse and she saw pt today did not obtain INR. She was calling for orders. Per [facility],The last INR on 5/22 was 1.4, coumadin instructions at discharge was 10mg s X 1 dose, then 6mg s QD with recheck INR on 5/24. Nurse states she was unaware of orders, not relayed by [facility]. Pt has HD MWF, nurse only able to obtain INR on Tuesdays or Thursdays, thus she will get INR on 11/30/15. Called and spoke with pts wife and she states pt has been taking 5mg s QD since discharge since he has 5mg  tablet at home. Thus, due to dose change and some med changes, Pt will come in tomorrow for INR check prior to HD.

## 2015-11-25 NOTE — Telephone Encounter (Signed)
Spoke with patient's wife to offer her appointment with Dr. Acie Fredrickson for tomorrow 6/2.  She states patient has dialysis on Mon, Wed, Fri, so he will need a Tuesday or Thursday appointment.  She states his BP has been running low - states systolic in the low [id number] mmHg and diastolic in the 0000000 mmHg.  He is not on any anti-hypertensive agents.  I asked her about patient's diet and she states he has good appetite.  I advised her that if he gets dizzy or light-headed with lower BP to have him eat a salty snack.  She states his BP was better at his PCP visit earlier in the week.  I advised that I will call back with an appointment if something opens up for next week and to call us if she has questions or concerns prior to ov.  She verbalized understanding and agreement and thanked me for the call.

## 2015-11-25 NOTE — Telephone Encounter (Signed)
New message   Burna Mortimer is calling to see when the INR needs to be checked

## 2015-11-26 ENCOUNTER — Ambulatory Visit (INDEPENDENT_AMBULATORY_CARE_PROVIDER_SITE_OTHER): Payer: Medicare Other | Admitting: Pharmacist

## 2015-11-26 DIAGNOSIS — D631 Anemia in chronic kidney disease: Secondary | ICD-10-CM | POA: Diagnosis not present

## 2015-11-26 DIAGNOSIS — I5042 Chronic combined systolic (congestive) and diastolic (congestive) heart failure: Secondary | ICD-10-CM | POA: Diagnosis not present

## 2015-11-26 DIAGNOSIS — I2510 Atherosclerotic heart disease of native coronary artery without angina pectoris: Secondary | ICD-10-CM | POA: Diagnosis not present

## 2015-11-26 DIAGNOSIS — I480 Paroxysmal atrial fibrillation: Secondary | ICD-10-CM

## 2015-11-26 DIAGNOSIS — Z5181 Encounter for therapeutic drug level monitoring: Secondary | ICD-10-CM

## 2015-11-26 DIAGNOSIS — N186 End stage renal disease: Secondary | ICD-10-CM | POA: Diagnosis not present

## 2015-11-26 DIAGNOSIS — D509 Iron deficiency anemia, unspecified: Secondary | ICD-10-CM | POA: Diagnosis not present

## 2015-11-26 DIAGNOSIS — E1122 Type 2 diabetes mellitus with diabetic chronic kidney disease: Secondary | ICD-10-CM | POA: Diagnosis not present

## 2015-11-26 DIAGNOSIS — L89620 Pressure ulcer of left heel, unstageable: Secondary | ICD-10-CM | POA: Diagnosis not present

## 2015-11-26 DIAGNOSIS — I129 Hypertensive chronic kidney disease with stage 1 through stage 4 chronic kidney disease, or unspecified chronic kidney disease: Secondary | ICD-10-CM | POA: Diagnosis not present

## 2015-11-26 DIAGNOSIS — E1129 Type 2 diabetes mellitus with other diabetic kidney complication: Secondary | ICD-10-CM | POA: Diagnosis not present

## 2015-11-26 LAB — POCT INR: INR: 3.7

## 2015-11-27 DIAGNOSIS — I5042 Chronic combined systolic (congestive) and diastolic (congestive) heart failure: Secondary | ICD-10-CM | POA: Diagnosis not present

## 2015-11-27 DIAGNOSIS — I2510 Atherosclerotic heart disease of native coronary artery without angina pectoris: Secondary | ICD-10-CM | POA: Diagnosis not present

## 2015-11-27 DIAGNOSIS — I129 Hypertensive chronic kidney disease with stage 1 through stage 4 chronic kidney disease, or unspecified chronic kidney disease: Secondary | ICD-10-CM | POA: Diagnosis not present

## 2015-11-27 DIAGNOSIS — N186 End stage renal disease: Secondary | ICD-10-CM | POA: Diagnosis not present

## 2015-11-27 DIAGNOSIS — L89620 Pressure ulcer of left heel, unstageable: Secondary | ICD-10-CM | POA: Diagnosis not present

## 2015-11-27 DIAGNOSIS — E1122 Type 2 diabetes mellitus with diabetic chronic kidney disease: Secondary | ICD-10-CM | POA: Diagnosis not present

## 2015-11-29 DIAGNOSIS — N186 End stage renal disease: Secondary | ICD-10-CM | POA: Diagnosis not present

## 2015-11-29 DIAGNOSIS — D631 Anemia in chronic kidney disease: Secondary | ICD-10-CM | POA: Diagnosis not present

## 2015-11-29 DIAGNOSIS — D509 Iron deficiency anemia, unspecified: Secondary | ICD-10-CM | POA: Diagnosis not present

## 2015-11-29 DIAGNOSIS — E1129 Type 2 diabetes mellitus with other diabetic kidney complication: Secondary | ICD-10-CM | POA: Diagnosis not present

## 2015-11-30 ENCOUNTER — Ambulatory Visit (INDEPENDENT_AMBULATORY_CARE_PROVIDER_SITE_OTHER): Payer: Medicare Other | Admitting: Internal Medicine

## 2015-11-30 DIAGNOSIS — L89620 Pressure ulcer of left heel, unstageable: Secondary | ICD-10-CM | POA: Diagnosis not present

## 2015-11-30 DIAGNOSIS — I129 Hypertensive chronic kidney disease with stage 1 through stage 4 chronic kidney disease, or unspecified chronic kidney disease: Secondary | ICD-10-CM | POA: Diagnosis not present

## 2015-11-30 DIAGNOSIS — E1122 Type 2 diabetes mellitus with diabetic chronic kidney disease: Secondary | ICD-10-CM | POA: Diagnosis not present

## 2015-11-30 DIAGNOSIS — I2510 Atherosclerotic heart disease of native coronary artery without angina pectoris: Secondary | ICD-10-CM | POA: Diagnosis not present

## 2015-11-30 DIAGNOSIS — I480 Paroxysmal atrial fibrillation: Secondary | ICD-10-CM

## 2015-11-30 DIAGNOSIS — N186 End stage renal disease: Secondary | ICD-10-CM | POA: Diagnosis not present

## 2015-11-30 DIAGNOSIS — Z5181 Encounter for therapeutic drug level monitoring: Secondary | ICD-10-CM

## 2015-11-30 DIAGNOSIS — I5042 Chronic combined systolic (congestive) and diastolic (congestive) heart failure: Secondary | ICD-10-CM | POA: Diagnosis not present

## 2015-11-30 LAB — POCT INR: INR: 2

## 2015-12-01 ENCOUNTER — Encounter (HOSPITAL_COMMUNITY): Payer: Medicare Other

## 2015-12-01 ENCOUNTER — Ambulatory Visit: Payer: Medicare Other | Admitting: Family

## 2015-12-01 DIAGNOSIS — D631 Anemia in chronic kidney disease: Secondary | ICD-10-CM | POA: Diagnosis not present

## 2015-12-01 DIAGNOSIS — D509 Iron deficiency anemia, unspecified: Secondary | ICD-10-CM | POA: Diagnosis not present

## 2015-12-01 DIAGNOSIS — N186 End stage renal disease: Secondary | ICD-10-CM | POA: Diagnosis not present

## 2015-12-01 DIAGNOSIS — E1129 Type 2 diabetes mellitus with other diabetic kidney complication: Secondary | ICD-10-CM | POA: Diagnosis not present

## 2015-12-02 DIAGNOSIS — I129 Hypertensive chronic kidney disease with stage 1 through stage 4 chronic kidney disease, or unspecified chronic kidney disease: Secondary | ICD-10-CM | POA: Diagnosis not present

## 2015-12-02 DIAGNOSIS — I2510 Atherosclerotic heart disease of native coronary artery without angina pectoris: Secondary | ICD-10-CM | POA: Diagnosis not present

## 2015-12-02 DIAGNOSIS — L89620 Pressure ulcer of left heel, unstageable: Secondary | ICD-10-CM | POA: Diagnosis not present

## 2015-12-02 DIAGNOSIS — I5042 Chronic combined systolic (congestive) and diastolic (congestive) heart failure: Secondary | ICD-10-CM | POA: Diagnosis not present

## 2015-12-02 DIAGNOSIS — E1122 Type 2 diabetes mellitus with diabetic chronic kidney disease: Secondary | ICD-10-CM | POA: Diagnosis not present

## 2015-12-02 DIAGNOSIS — N186 End stage renal disease: Secondary | ICD-10-CM | POA: Diagnosis not present

## 2015-12-03 DIAGNOSIS — D509 Iron deficiency anemia, unspecified: Secondary | ICD-10-CM | POA: Diagnosis not present

## 2015-12-03 DIAGNOSIS — D631 Anemia in chronic kidney disease: Secondary | ICD-10-CM | POA: Diagnosis not present

## 2015-12-03 DIAGNOSIS — E1129 Type 2 diabetes mellitus with other diabetic kidney complication: Secondary | ICD-10-CM | POA: Diagnosis not present

## 2015-12-03 DIAGNOSIS — N186 End stage renal disease: Secondary | ICD-10-CM | POA: Diagnosis not present

## 2015-12-06 DIAGNOSIS — D631 Anemia in chronic kidney disease: Secondary | ICD-10-CM | POA: Diagnosis not present

## 2015-12-06 DIAGNOSIS — D509 Iron deficiency anemia, unspecified: Secondary | ICD-10-CM | POA: Diagnosis not present

## 2015-12-06 DIAGNOSIS — N186 End stage renal disease: Secondary | ICD-10-CM | POA: Diagnosis not present

## 2015-12-06 DIAGNOSIS — E1129 Type 2 diabetes mellitus with other diabetic kidney complication: Secondary | ICD-10-CM | POA: Diagnosis not present

## 2015-12-07 DIAGNOSIS — I129 Hypertensive chronic kidney disease with stage 1 through stage 4 chronic kidney disease, or unspecified chronic kidney disease: Secondary | ICD-10-CM | POA: Diagnosis not present

## 2015-12-07 DIAGNOSIS — L89620 Pressure ulcer of left heel, unstageable: Secondary | ICD-10-CM | POA: Diagnosis not present

## 2015-12-07 DIAGNOSIS — I2510 Atherosclerotic heart disease of native coronary artery without angina pectoris: Secondary | ICD-10-CM | POA: Diagnosis not present

## 2015-12-07 DIAGNOSIS — I5042 Chronic combined systolic (congestive) and diastolic (congestive) heart failure: Secondary | ICD-10-CM | POA: Diagnosis not present

## 2015-12-07 DIAGNOSIS — E1122 Type 2 diabetes mellitus with diabetic chronic kidney disease: Secondary | ICD-10-CM | POA: Diagnosis not present

## 2015-12-07 DIAGNOSIS — N186 End stage renal disease: Secondary | ICD-10-CM | POA: Diagnosis not present

## 2015-12-08 DIAGNOSIS — D631 Anemia in chronic kidney disease: Secondary | ICD-10-CM | POA: Diagnosis not present

## 2015-12-08 DIAGNOSIS — E1129 Type 2 diabetes mellitus with other diabetic kidney complication: Secondary | ICD-10-CM | POA: Diagnosis not present

## 2015-12-08 DIAGNOSIS — N186 End stage renal disease: Secondary | ICD-10-CM | POA: Diagnosis not present

## 2015-12-08 DIAGNOSIS — D509 Iron deficiency anemia, unspecified: Secondary | ICD-10-CM | POA: Diagnosis not present

## 2015-12-09 ENCOUNTER — Ambulatory Visit (INDEPENDENT_AMBULATORY_CARE_PROVIDER_SITE_OTHER): Payer: Medicare Other

## 2015-12-09 DIAGNOSIS — E1122 Type 2 diabetes mellitus with diabetic chronic kidney disease: Secondary | ICD-10-CM | POA: Diagnosis not present

## 2015-12-09 DIAGNOSIS — Z5181 Encounter for therapeutic drug level monitoring: Secondary | ICD-10-CM

## 2015-12-09 DIAGNOSIS — N186 End stage renal disease: Secondary | ICD-10-CM | POA: Diagnosis not present

## 2015-12-09 DIAGNOSIS — I480 Paroxysmal atrial fibrillation: Secondary | ICD-10-CM

## 2015-12-09 DIAGNOSIS — I5042 Chronic combined systolic (congestive) and diastolic (congestive) heart failure: Secondary | ICD-10-CM | POA: Diagnosis not present

## 2015-12-09 DIAGNOSIS — L89620 Pressure ulcer of left heel, unstageable: Secondary | ICD-10-CM | POA: Diagnosis not present

## 2015-12-09 DIAGNOSIS — I129 Hypertensive chronic kidney disease with stage 1 through stage 4 chronic kidney disease, or unspecified chronic kidney disease: Secondary | ICD-10-CM | POA: Diagnosis not present

## 2015-12-09 DIAGNOSIS — I2510 Atherosclerotic heart disease of native coronary artery without angina pectoris: Secondary | ICD-10-CM | POA: Diagnosis not present

## 2015-12-09 LAB — POCT INR: INR: 1.6

## 2015-12-10 ENCOUNTER — Other Ambulatory Visit: Payer: Self-pay | Admitting: Nurse Practitioner

## 2015-12-10 DIAGNOSIS — E1129 Type 2 diabetes mellitus with other diabetic kidney complication: Secondary | ICD-10-CM | POA: Diagnosis not present

## 2015-12-10 DIAGNOSIS — D509 Iron deficiency anemia, unspecified: Secondary | ICD-10-CM | POA: Diagnosis not present

## 2015-12-10 DIAGNOSIS — N186 End stage renal disease: Secondary | ICD-10-CM | POA: Diagnosis not present

## 2015-12-10 DIAGNOSIS — D631 Anemia in chronic kidney disease: Secondary | ICD-10-CM | POA: Diagnosis not present

## 2015-12-13 DIAGNOSIS — D631 Anemia in chronic kidney disease: Secondary | ICD-10-CM | POA: Diagnosis not present

## 2015-12-13 DIAGNOSIS — N186 End stage renal disease: Secondary | ICD-10-CM | POA: Diagnosis not present

## 2015-12-13 DIAGNOSIS — D509 Iron deficiency anemia, unspecified: Secondary | ICD-10-CM | POA: Diagnosis not present

## 2015-12-13 DIAGNOSIS — E1129 Type 2 diabetes mellitus with other diabetic kidney complication: Secondary | ICD-10-CM | POA: Diagnosis not present

## 2015-12-14 DIAGNOSIS — N186 End stage renal disease: Secondary | ICD-10-CM | POA: Diagnosis not present

## 2015-12-14 DIAGNOSIS — I129 Hypertensive chronic kidney disease with stage 1 through stage 4 chronic kidney disease, or unspecified chronic kidney disease: Secondary | ICD-10-CM | POA: Diagnosis not present

## 2015-12-14 DIAGNOSIS — L89159 Pressure ulcer of sacral region, unspecified stage: Secondary | ICD-10-CM | POA: Diagnosis not present

## 2015-12-14 DIAGNOSIS — E11621 Type 2 diabetes mellitus with foot ulcer: Secondary | ICD-10-CM | POA: Diagnosis not present

## 2015-12-14 DIAGNOSIS — N185 Chronic kidney disease, stage 5: Secondary | ICD-10-CM | POA: Diagnosis not present

## 2015-12-14 DIAGNOSIS — I5042 Chronic combined systolic (congestive) and diastolic (congestive) heart failure: Secondary | ICD-10-CM | POA: Diagnosis not present

## 2015-12-14 DIAGNOSIS — L89620 Pressure ulcer of left heel, unstageable: Secondary | ICD-10-CM | POA: Diagnosis not present

## 2015-12-14 DIAGNOSIS — G308 Other Alzheimer's disease: Secondary | ICD-10-CM | POA: Diagnosis not present

## 2015-12-14 DIAGNOSIS — E038 Other specified hypothyroidism: Secondary | ICD-10-CM | POA: Diagnosis not present

## 2015-12-14 DIAGNOSIS — R2689 Other abnormalities of gait and mobility: Secondary | ICD-10-CM | POA: Diagnosis not present

## 2015-12-14 DIAGNOSIS — I480 Paroxysmal atrial fibrillation: Secondary | ICD-10-CM | POA: Diagnosis not present

## 2015-12-14 DIAGNOSIS — I2510 Atherosclerotic heart disease of native coronary artery without angina pectoris: Secondary | ICD-10-CM | POA: Diagnosis not present

## 2015-12-14 DIAGNOSIS — E1122 Type 2 diabetes mellitus with diabetic chronic kidney disease: Secondary | ICD-10-CM | POA: Diagnosis not present

## 2015-12-14 DIAGNOSIS — I7389 Other specified peripheral vascular diseases: Secondary | ICD-10-CM | POA: Diagnosis not present

## 2015-12-14 DIAGNOSIS — Z6825 Body mass index (BMI) 25.0-25.9, adult: Secondary | ICD-10-CM | POA: Diagnosis not present

## 2015-12-15 DIAGNOSIS — E1129 Type 2 diabetes mellitus with other diabetic kidney complication: Secondary | ICD-10-CM | POA: Diagnosis not present

## 2015-12-15 DIAGNOSIS — D631 Anemia in chronic kidney disease: Secondary | ICD-10-CM | POA: Diagnosis not present

## 2015-12-15 DIAGNOSIS — N186 End stage renal disease: Secondary | ICD-10-CM | POA: Diagnosis not present

## 2015-12-15 DIAGNOSIS — D509 Iron deficiency anemia, unspecified: Secondary | ICD-10-CM | POA: Diagnosis not present

## 2015-12-15 NOTE — Progress Notes (Signed)
Cardiology Office Note   Date:  12/16/2015   ID:  [name], DOB 04-01-1934, MRN [id number]  PCP:  Donnajean Lopes, MD  Cardiologist:  Dr. Acie Fredrickson     Chief Complaint  Patient presents with  . Hospitalization Follow-up    NO CP, SOB OR SWELLING. NO OTHER COMPLAINTS.      History of Present Illness: [name] is a 82 y.o. male who presents for post hospitalization 09/09/15 to 09/29/15 fpr acute on chronic diastolic HF and started dialysis 09/18/15 history of CAD- CABG, bradycardia (off bb) , CKD Stage IV, hypothyroidism, A fib DC-CV 2015 on amio, has LUE AVF. Most recent cath 2008 LAD, CFX 100%, SVG-OM 90%, RCA 70%.   He had developed progressive lower extremity edema right leg greater than left. Right lower extremity edema has always been more of an issue than left lower extremity edema. He denied chest pain. For year and half he has slept in a recliner after his last hospitalization for heart failure. There concern now his cough, progressive dyspnea, and the concern about the recent x-ray performed by Dr. Philip Aspen that suggested congestive heart failure.   He has had a fistula placed for anticipated dialysis.  He was seen by Dr. Tamala Julian the day of admit as he was DOD and pt's wt was 211.    With hospitalization he had 31 pounds removed, with Lasix drip, and then HD- now on MWF.   Post discharge he went to [facility] for rehab but is now back home.    Today his wt is 170.8 on his home scales.    There is history of atrial fibrillation with prior cardioversion. He was maintained on amiodarone therapy but this was stopped as well in the hospital with bradycardia.    Today he looks good, though he is not overly happy about dialysis.  He is still going but some days he threatens not to go.  He does have hypotension and his imdur has been decreased to 15 mg.    He has oxygen that he uses as needed during the day but does wear at night.   He denies SOB or chest pain.     Past  Medical History  Diagnosis Date  . Hypertension   . Hyperlipidemia   . Hypothyroidism   . CAD (coronary artery disease)     a. prior CABG in 1997 with redo in 2000. b. last cath in 2008 - managed medically  . Type 2 diabetes mellitus ([place])   . Generalized weakness     without overt findings  . Peripheral vascular disease ([place])   . Carotid disease, bilateral ([place])     with multiple bilateral carotid surgeries  . Psoriasis   . Meniere's disease   . Degenerative joint disease   . Gout   . Orthopnea     Two-pillow  . CKD (chronic kidney disease), stage IV ([place])     a. Has fistula in place.   . Chronic diastolic CHF (congestive heart failure) ([place])   . GERD (gastroesophageal reflux disease)   . Atrial fibrillation ([place]) Aug. 2015    a. Dx 01/2014, s/p DCCV 03/06/14.  . Sinus bradycardia     a. Toprol D/C'd due to this.   . Shingles   . On home oxygen therapy     "2L" qhs (09/09/2015  . Dementia   . Chronic respiratory failure with hypoxia [facility])     Past Surgical History  Procedure Laterality Date  .  Coronary artery bypass graft  1997;  2002  . Carotid endarterectomy Bilateral "several times"    "5 on right; 2-3 on left" (09/09/2015)  . Lung removal, partial    . Lumbar laminectomy  July 2001  . Thoracotomy Left     due to fungal infection  . Shoulder surgery Bilateral   . Cardiac catheterization    . Appendectomy    . Av fistula placement Left 01/21/2013    Procedure: ARTERIOVENOUS (AV) FISTULA CREATION, Left Brachiocephalic;  Surgeon: Angelia Mould, MD;  Location: [place];  Service: Vascular;  Laterality: Left;  . Cardiac catheterization  2008    L main irreg, LAD 80%, IMA-LAD & SVG-Diag patent, CFX 100%, SVG-OM 90%, RCA 70%, SVG-RCA OK, EF nl, med rx, no vessels appropriate for PCI  . Tee without cardioversion N/A 03/06/2014    Procedure: TRANSESOPHAGEAL ECHOCARDIOGRAM (TEE);  Surgeon: Larey Dresser, MD;  Location: [place];  Service: Cardiovascular;  Laterality:  N/A;  . Cardioversion N/A 03/06/2014    Procedure: CARDIOVERSION;  Surgeon: Larey Dresser, MD;  Location: [place];  Service: Cardiovascular;  Laterality: N/A;  . Blepharoplasty Bilateral      Current Outpatient Prescriptions  Medication Sig Dispense Refill  . acetaminophen (TYLENOL) 325 MG tablet Take 2 tablets (650 mg total) by mouth every 4 (four) hours as needed for headache or mild pain.    Marland Kitchen albuterol (PROVENTIL) (2.5 MG/3ML) 0.083% nebulizer solution Take 3 mLs (2.5 mg total) by nebulization every 6 (six) hours as needed for wheezing or shortness of breath. 75 mL 12  . atorvastatin (LIPITOR) 80 MG tablet TAKE ONE TABLET BY MOUTH ONCE DAILY 90 tablet 3  . bisacodyl (DULCOLAX) 10 MG suppository Place 10 mg rectally daily as needed for moderate constipation.    . cholecalciferol (VITAMIN D) 1000 UNITS tablet Take 1,000 Units by mouth daily at 6 PM. D3    . geriatric multivitamins-minerals (ELDERTONIC/GEVRABON) ELIX Take 15 mLs by mouth 2 (two) times daily.     Marland Kitchen guaifenesin (ROBITUSSIN) 100 MG/5ML syrup Take 10 mLs (200 mg total) by mouth every 6 (six) hours as needed for cough or congestion. 120 mL 0  . insulin aspart protamine- aspart (NOVOLOG MIX 70/30) (70-30) 100 UNIT/ML injection Inject 8 Units into the skin 2 (two) times daily with a meal.    . Insulin Glargine (LANTUS SOLOSTAR) 100 UNIT/ML SOPN Inject 24 Units into the skin daily.     . isosorbide mononitrate (IMDUR) 30 MG 24 hr tablet Take 30 mg by mouth daily. PATIENT TAKES HALF A TABLET (15MG ) BY MOUTH DAILY.    Marland Kitchen levothyroxine (SYNTHROID, LEVOTHROID) 175 MCG tablet Take 175 mcg by mouth daily before breakfast.    . memantine (NAMENDA) 5 MG tablet Take 5 mg by mouth daily.     . Multiple Vitamin (MULTIVITAMIN) tablet Take 1 tablet by mouth at bedtime.    . Multiple Vitamins-Minerals (DECUBI-VITE) CAPS Take 1 capsule by mouth daily.    . ondansetron (ZOFRAN) 4 MG tablet Take 4 mg by mouth every 6 (six) hours as needed for  nausea or vomiting.    . OXYGEN Inhale 4 L/min into the lungs continuous. To keep O2 sats >90    . pantoprazole (PROTONIX) 40 MG tablet Take 1 tablet (40 mg total) by mouth daily at 6 (six) AM.    . polyethylene glycol (MIRALAX / GLYCOLAX) packet Take 17 g by mouth 2 (two) times daily.    . Protein (PROCEL) POWD Take 2 scoop  by mouth 2 (two) times daily.    . ranitidine (ZANTAC) 300 MG tablet Take 300 mg by mouth daily.    . sennosides-docusate sodium (SENOKOT-S) 8.6-50 MG tablet Take 2 tablets by mouth 2 (two) times daily.    . sevelamer carbonate (RENVELA) 2.4 g PACK Take 2.4 g by mouth 3 (three) times daily. 90 each   . Warfarin Sodium (COUMADIN PO) Take by mouth daily. Give 10 mg po x1 11/15/15, then Coumadin 6 mg po qd     No current facility-administered medications for this visit.    Allergies:   Betadine; Iodinated diagnostic agents; and Latex    Social History:  The patient  reports that he has never smoked. He has never used smokeless tobacco. He reports that he does not drink alcohol or use illicit drugs.   Family History:  The patient's family history includes Diabetes in his brother and mother; Heart attack in his father; Heart disease in his brother, father, and mother; Hyperlipidemia in his father; Hypertension in his father and mother.    ROS:  General:no colds or fevers, no weight changes Skin:no rashes or ulcers HEENT:no blurred vision, no congestion CV:see HPI PUL:see HPI GI:no diarrhea constipation or melena, no indigestion GU:no hematuria, no dysuria- still voids, did have UTI at [facility] MS:no joint pain, no claudication Neuro:no syncope, no lightheadedness Endo:+ diabetes- doing well per his wife, + thyroid disease followed by PCP  Wt Readings from Last 3 Encounters:  11/15/15 175 lb 12.8 oz (79.742 kg)  11/09/15 178 lb 9.6 oz (81.012 kg)  11/04/15 170 lb 6.4 oz (77.293 kg)     PHYSICAL EXAM: VS:  BP 100/54 mmHg  Pulse 62  Ht 5\' 10"  (1.778 m)  Wt    SpO2 92% , BMI There is no weight on file to calculate BMI. General:Pleasant affect, NAD Skin:Warm and dry, brisk capillary refill HEENT:normocephalic, sclera clear, mucus membranes moist Neck:supple, no JVD, no bruits  Heart:S1S2 RRR with 3/6 systolic murmur, no gallup, rub or click Lungs:clear without rales, rhonchi, or wheezes [id number], non tender, + BS, do not palpate liver spleen or masses Ext:no lower ext edema,  2+ radial pulses, ulcer Lt heel with boot in place did not examine Neuro:alert and oriented X 3, MAE, follows commands, + facial symmetry    EKG:  EKG is not ordered today.    Recent Labs: 09/09/2015: B Natriuretic Peptide 729.6*; TSH 1.545 09/17/2015: Magnesium 2.2 09/21/2015: ALT 69* 10/01/2015: Hemoglobin 10.3*; Platelets 182 10/12/2015: BUN 23*; Creatinine 3.2*; Potassium 3.6; Sodium 138    Lipid Panel    Component Value Date/Time   CHOL 143 02/13/2014 0815   TRIG 114.0 02/13/2014 0815   HDL 33.00* 02/13/2014 0815   CHOLHDL 4 02/13/2014 0815   VLDL 22.8 02/13/2014 0815   LDLCALC 87 02/13/2014 0815   LDLDIRECT 112.7 09/27/2012 0834       Other studies Reviewed: Additional studies/ records that were reviewed today include:  Study Conclusions  - Left ventricle: The cavity size was normal. Wall thickness was  increased in a pattern of mild LVH. Systolic function was normal.  The estimated ejection fraction was in the range of 60% to 65%.  Wall motion was normal; there were no regional wall motion  abnormalities. Features are consistent with a pseudonormal left  ventricular filling pattern, with concomitant abnormal relaxation  and increased filling pressure (grade 2 diastolic dysfunction).  Doppler parameters are consistent with high ventricular filling  pressure. - Aortic valve: Valve mobility was restricted.  There was moderate  stenosis. There was mild regurgitation. Valve area (VTI): 1.23  cm^2. Valve area (Vmax): 1.21 cm^2. Valve area  (Vmean): 1.09  cm^2. - Mitral valve: Severely calcified annulus. The findings are  consistent with mild stenosis. There was mild regurgitation. - Left atrium: The atrium was moderately dilated. - Tricuspid valve: There was moderate regurgitation. - Pulmonary arteries: Systolic pressure was moderately increased.  Impressions:  - Normal LV systolic function; grade 2 diastolic dysfunction with  elevated LV filling pressure; moderate LAE; severe MAC with mild  MS (mean gradient 6 mmHg) and mild MR; calcified aortic valve  with moderate AS (mean gradient 31 mmHg); mild AI; moderate TR  with moderately elevated pulmonary pressure.   ASSESSMENT AND PLAN:  1.  Acute on chronic diastolic CHF: EF [id number], moderate diastolic dysfunction- now euvolemic with dialysis  2. ESRD HD started 09/18/15 and has in MWF  3.  Atrial fibrillation: Paroxysmal, in NSR. No beta blocker with bradycardia.  Is off amiodarone. to coumadin clinic today  4. CAD: s/p CABG, stable. Continue statin. no chest pain, his imdur has been decreased due to hypotension.  5. AS: Moderate on echo.    6. Mitral stenosis: Mild on echo.   7. Hypothyroidism followed by PCP  8. Essential HTN now borderline with dialysis  9. Dementia  10. DNR   11. DM followed by PCP  Current medicines are reviewed with the patient today.  The patient Has no concerns regarding medicines.  The following changes have been made:  See above Labs/ tests ordered today include:see above  Disposition:   FU:  see above  Signed, Cecilie Kicks, NP  12/16/2015 12:10 PM    [address] [place], [place], [place], [place] Phone: [phone]; Fax: [phone]

## 2015-12-16 ENCOUNTER — Ambulatory Visit (INDEPENDENT_AMBULATORY_CARE_PROVIDER_SITE_OTHER): Payer: Medicare Other | Admitting: Cardiology

## 2015-12-16 ENCOUNTER — Ambulatory Visit (INDEPENDENT_AMBULATORY_CARE_PROVIDER_SITE_OTHER): Payer: Medicare Other | Admitting: *Deleted

## 2015-12-16 ENCOUNTER — Encounter: Payer: Self-pay | Admitting: Cardiology

## 2015-12-16 VITALS — BP 100/54 | HR 62 | Ht 70.0 in

## 2015-12-16 DIAGNOSIS — E1122 Type 2 diabetes mellitus with diabetic chronic kidney disease: Secondary | ICD-10-CM | POA: Diagnosis not present

## 2015-12-16 DIAGNOSIS — N186 End stage renal disease: Secondary | ICD-10-CM | POA: Diagnosis not present

## 2015-12-16 DIAGNOSIS — I10 Essential (primary) hypertension: Secondary | ICD-10-CM

## 2015-12-16 DIAGNOSIS — I4819 Other persistent atrial fibrillation: Secondary | ICD-10-CM

## 2015-12-16 DIAGNOSIS — L89620 Pressure ulcer of left heel, unstageable: Secondary | ICD-10-CM | POA: Diagnosis not present

## 2015-12-16 DIAGNOSIS — I5033 Acute on chronic diastolic (congestive) heart failure: Secondary | ICD-10-CM

## 2015-12-16 DIAGNOSIS — I2510 Atherosclerotic heart disease of native coronary artery without angina pectoris: Secondary | ICD-10-CM | POA: Diagnosis not present

## 2015-12-16 DIAGNOSIS — I480 Paroxysmal atrial fibrillation: Secondary | ICD-10-CM | POA: Diagnosis not present

## 2015-12-16 DIAGNOSIS — Z5181 Encounter for therapeutic drug level monitoring: Secondary | ICD-10-CM

## 2015-12-16 DIAGNOSIS — Z79899 Other long term (current) drug therapy: Secondary | ICD-10-CM

## 2015-12-16 DIAGNOSIS — Z992 Dependence on renal dialysis: Secondary | ICD-10-CM

## 2015-12-16 DIAGNOSIS — I481 Persistent atrial fibrillation: Secondary | ICD-10-CM | POA: Diagnosis not present

## 2015-12-16 DIAGNOSIS — I129 Hypertensive chronic kidney disease with stage 1 through stage 4 chronic kidney disease, or unspecified chronic kidney disease: Secondary | ICD-10-CM | POA: Diagnosis not present

## 2015-12-16 DIAGNOSIS — I25810 Atherosclerosis of coronary artery bypass graft(s) without angina pectoris: Secondary | ICD-10-CM

## 2015-12-16 DIAGNOSIS — F0390 Unspecified dementia without behavioral disturbance: Secondary | ICD-10-CM

## 2015-12-16 DIAGNOSIS — I5042 Chronic combined systolic (congestive) and diastolic (congestive) heart failure: Secondary | ICD-10-CM | POA: Diagnosis not present

## 2015-12-16 LAB — POCT INR: INR: 1.8

## 2015-12-16 NOTE — Patient Instructions (Signed)
Medication Instructions:  Your physician recommends that you continue on your current medications as directed. Please refer to the Current Medication list given to you today.  Hold Imdur (Isosorbide) if BP <100 (top number)  Labwork: None Ordered   Testing/Procedures: None Ordered   Follow-Up: Your physician recommends that you return for a follow-up appointment on Thurs.  August 17 at 9:30   If you need a refill on your cardiac medications before your next appointment, please call your pharmacy.   Thank you for choosing [facility]! Christen Bame, RN [phone]

## 2015-12-17 DIAGNOSIS — D509 Iron deficiency anemia, unspecified: Secondary | ICD-10-CM | POA: Diagnosis not present

## 2015-12-17 DIAGNOSIS — D631 Anemia in chronic kidney disease: Secondary | ICD-10-CM | POA: Diagnosis not present

## 2015-12-17 DIAGNOSIS — N186 End stage renal disease: Secondary | ICD-10-CM | POA: Diagnosis not present

## 2015-12-17 DIAGNOSIS — E1129 Type 2 diabetes mellitus with other diabetic kidney complication: Secondary | ICD-10-CM | POA: Diagnosis not present

## 2015-12-20 DIAGNOSIS — E1129 Type 2 diabetes mellitus with other diabetic kidney complication: Secondary | ICD-10-CM | POA: Diagnosis not present

## 2015-12-20 DIAGNOSIS — D631 Anemia in chronic kidney disease: Secondary | ICD-10-CM | POA: Diagnosis not present

## 2015-12-20 DIAGNOSIS — N186 End stage renal disease: Secondary | ICD-10-CM | POA: Diagnosis not present

## 2015-12-20 DIAGNOSIS — D509 Iron deficiency anemia, unspecified: Secondary | ICD-10-CM | POA: Diagnosis not present

## 2015-12-21 DIAGNOSIS — I129 Hypertensive chronic kidney disease with stage 1 through stage 4 chronic kidney disease, or unspecified chronic kidney disease: Secondary | ICD-10-CM | POA: Diagnosis not present

## 2015-12-21 DIAGNOSIS — E1122 Type 2 diabetes mellitus with diabetic chronic kidney disease: Secondary | ICD-10-CM | POA: Diagnosis not present

## 2015-12-21 DIAGNOSIS — L89620 Pressure ulcer of left heel, unstageable: Secondary | ICD-10-CM | POA: Diagnosis not present

## 2015-12-21 DIAGNOSIS — I2510 Atherosclerotic heart disease of native coronary artery without angina pectoris: Secondary | ICD-10-CM | POA: Diagnosis not present

## 2015-12-21 DIAGNOSIS — I5042 Chronic combined systolic (congestive) and diastolic (congestive) heart failure: Secondary | ICD-10-CM | POA: Diagnosis not present

## 2015-12-21 DIAGNOSIS — N186 End stage renal disease: Secondary | ICD-10-CM | POA: Diagnosis not present

## 2015-12-22 DIAGNOSIS — D509 Iron deficiency anemia, unspecified: Secondary | ICD-10-CM | POA: Diagnosis not present

## 2015-12-22 DIAGNOSIS — D631 Anemia in chronic kidney disease: Secondary | ICD-10-CM | POA: Diagnosis not present

## 2015-12-22 DIAGNOSIS — N186 End stage renal disease: Secondary | ICD-10-CM | POA: Diagnosis not present

## 2015-12-22 DIAGNOSIS — E1129 Type 2 diabetes mellitus with other diabetic kidney complication: Secondary | ICD-10-CM | POA: Diagnosis not present

## 2015-12-23 ENCOUNTER — Ambulatory Visit (INDEPENDENT_AMBULATORY_CARE_PROVIDER_SITE_OTHER): Payer: Medicare Other | Admitting: Interventional Cardiology

## 2015-12-23 DIAGNOSIS — L89620 Pressure ulcer of left heel, unstageable: Secondary | ICD-10-CM | POA: Diagnosis not present

## 2015-12-23 DIAGNOSIS — E1122 Type 2 diabetes mellitus with diabetic chronic kidney disease: Secondary | ICD-10-CM | POA: Diagnosis not present

## 2015-12-23 DIAGNOSIS — I480 Paroxysmal atrial fibrillation: Secondary | ICD-10-CM

## 2015-12-23 DIAGNOSIS — I5042 Chronic combined systolic (congestive) and diastolic (congestive) heart failure: Secondary | ICD-10-CM | POA: Diagnosis not present

## 2015-12-23 DIAGNOSIS — I2510 Atherosclerotic heart disease of native coronary artery without angina pectoris: Secondary | ICD-10-CM | POA: Diagnosis not present

## 2015-12-23 DIAGNOSIS — Z5181 Encounter for therapeutic drug level monitoring: Secondary | ICD-10-CM

## 2015-12-23 DIAGNOSIS — I129 Hypertensive chronic kidney disease with stage 1 through stage 4 chronic kidney disease, or unspecified chronic kidney disease: Secondary | ICD-10-CM | POA: Diagnosis not present

## 2015-12-23 DIAGNOSIS — N186 End stage renal disease: Secondary | ICD-10-CM | POA: Diagnosis not present

## 2015-12-23 LAB — POCT INR: INR: 2

## 2015-12-24 DIAGNOSIS — N186 End stage renal disease: Secondary | ICD-10-CM | POA: Diagnosis not present

## 2015-12-24 DIAGNOSIS — E1122 Type 2 diabetes mellitus with diabetic chronic kidney disease: Secondary | ICD-10-CM | POA: Diagnosis not present

## 2015-12-24 DIAGNOSIS — D509 Iron deficiency anemia, unspecified: Secondary | ICD-10-CM | POA: Diagnosis not present

## 2015-12-24 DIAGNOSIS — D631 Anemia in chronic kidney disease: Secondary | ICD-10-CM | POA: Diagnosis not present

## 2015-12-24 DIAGNOSIS — Z992 Dependence on renal dialysis: Secondary | ICD-10-CM | POA: Diagnosis not present

## 2015-12-24 DIAGNOSIS — E1129 Type 2 diabetes mellitus with other diabetic kidney complication: Secondary | ICD-10-CM | POA: Diagnosis not present

## 2015-12-27 DIAGNOSIS — E1129 Type 2 diabetes mellitus with other diabetic kidney complication: Secondary | ICD-10-CM | POA: Diagnosis not present

## 2015-12-27 DIAGNOSIS — N186 End stage renal disease: Secondary | ICD-10-CM | POA: Diagnosis not present

## 2015-12-27 DIAGNOSIS — D509 Iron deficiency anemia, unspecified: Secondary | ICD-10-CM | POA: Diagnosis not present

## 2015-12-28 DIAGNOSIS — I2510 Atherosclerotic heart disease of native coronary artery without angina pectoris: Secondary | ICD-10-CM | POA: Diagnosis not present

## 2015-12-28 DIAGNOSIS — E1122 Type 2 diabetes mellitus with diabetic chronic kidney disease: Secondary | ICD-10-CM | POA: Diagnosis not present

## 2015-12-28 DIAGNOSIS — N186 End stage renal disease: Secondary | ICD-10-CM | POA: Diagnosis not present

## 2015-12-28 DIAGNOSIS — I5042 Chronic combined systolic (congestive) and diastolic (congestive) heart failure: Secondary | ICD-10-CM | POA: Diagnosis not present

## 2015-12-28 DIAGNOSIS — L89620 Pressure ulcer of left heel, unstageable: Secondary | ICD-10-CM | POA: Diagnosis not present

## 2015-12-28 DIAGNOSIS — I129 Hypertensive chronic kidney disease with stage 1 through stage 4 chronic kidney disease, or unspecified chronic kidney disease: Secondary | ICD-10-CM | POA: Diagnosis not present

## 2015-12-29 DIAGNOSIS — D509 Iron deficiency anemia, unspecified: Secondary | ICD-10-CM | POA: Diagnosis not present

## 2015-12-29 DIAGNOSIS — E1129 Type 2 diabetes mellitus with other diabetic kidney complication: Secondary | ICD-10-CM | POA: Diagnosis not present

## 2015-12-29 DIAGNOSIS — N186 End stage renal disease: Secondary | ICD-10-CM | POA: Diagnosis not present

## 2015-12-30 DIAGNOSIS — L89620 Pressure ulcer of left heel, unstageable: Secondary | ICD-10-CM | POA: Diagnosis not present

## 2015-12-30 DIAGNOSIS — N186 End stage renal disease: Secondary | ICD-10-CM | POA: Diagnosis not present

## 2015-12-30 DIAGNOSIS — I2510 Atherosclerotic heart disease of native coronary artery without angina pectoris: Secondary | ICD-10-CM | POA: Diagnosis not present

## 2015-12-30 DIAGNOSIS — E1122 Type 2 diabetes mellitus with diabetic chronic kidney disease: Secondary | ICD-10-CM | POA: Diagnosis not present

## 2015-12-30 DIAGNOSIS — I129 Hypertensive chronic kidney disease with stage 1 through stage 4 chronic kidney disease, or unspecified chronic kidney disease: Secondary | ICD-10-CM | POA: Diagnosis not present

## 2015-12-30 DIAGNOSIS — I5042 Chronic combined systolic (congestive) and diastolic (congestive) heart failure: Secondary | ICD-10-CM | POA: Diagnosis not present

## 2015-12-30 MED FILL — SANTYL OINTMENT: 250 | 10 days supply | Qty: 30 | Fill #0

## 2015-12-31 ENCOUNTER — Other Ambulatory Visit: Payer: Self-pay | Admitting: Cardiovascular Disease

## 2015-12-31 ENCOUNTER — Encounter: Payer: Self-pay | Admitting: Family

## 2015-12-31 DIAGNOSIS — D509 Iron deficiency anemia, unspecified: Secondary | ICD-10-CM | POA: Diagnosis not present

## 2015-12-31 DIAGNOSIS — N186 End stage renal disease: Secondary | ICD-10-CM | POA: Diagnosis not present

## 2015-12-31 DIAGNOSIS — E1129 Type 2 diabetes mellitus with other diabetic kidney complication: Secondary | ICD-10-CM | POA: Diagnosis not present

## 2016-01-03 DIAGNOSIS — D509 Iron deficiency anemia, unspecified: Secondary | ICD-10-CM | POA: Diagnosis not present

## 2016-01-03 DIAGNOSIS — N186 End stage renal disease: Secondary | ICD-10-CM | POA: Diagnosis not present

## 2016-01-03 DIAGNOSIS — E1129 Type 2 diabetes mellitus with other diabetic kidney complication: Secondary | ICD-10-CM | POA: Diagnosis not present

## 2016-01-05 DIAGNOSIS — E1122 Type 2 diabetes mellitus with diabetic chronic kidney disease: Secondary | ICD-10-CM | POA: Diagnosis not present

## 2016-01-05 DIAGNOSIS — N186 End stage renal disease: Secondary | ICD-10-CM | POA: Diagnosis not present

## 2016-01-05 DIAGNOSIS — E1129 Type 2 diabetes mellitus with other diabetic kidney complication: Secondary | ICD-10-CM | POA: Diagnosis not present

## 2016-01-05 DIAGNOSIS — D509 Iron deficiency anemia, unspecified: Secondary | ICD-10-CM | POA: Diagnosis not present

## 2016-01-06 ENCOUNTER — Ambulatory Visit (HOSPITAL_COMMUNITY)
Admission: RE | Admit: 2016-01-06 | Discharge: 2016-01-06 | Disposition: A | Discharge status: Home / Self Care | Payer: Medicare Other | Source: Ambulatory Visit | Attending: Vascular Surgery | Admitting: Vascular Surgery

## 2016-01-06 ENCOUNTER — Ambulatory Visit (INDEPENDENT_AMBULATORY_CARE_PROVIDER_SITE_OTHER)
Admission: RE | Admit: 2016-01-06 | Discharge: 2016-01-06 | Disposition: A | Discharge status: Home / Self Care | Payer: Medicare Other | Source: Ambulatory Visit | Attending: Family | Admitting: Family

## 2016-01-06 ENCOUNTER — Encounter: Payer: Self-pay | Admitting: Family

## 2016-01-06 ENCOUNTER — Ambulatory Visit (INDEPENDENT_AMBULATORY_CARE_PROVIDER_SITE_OTHER): Payer: Medicare Other | Admitting: Family

## 2016-01-06 ENCOUNTER — Ambulatory Visit (INDEPENDENT_AMBULATORY_CARE_PROVIDER_SITE_OTHER): Payer: Medicare Other | Admitting: Internal Medicine

## 2016-01-06 VITALS — BP 114/56 | HR 68 | Temp 98.0°F | Resp 16 | Ht 70.0 in | Wt 169.0 lb

## 2016-01-06 DIAGNOSIS — I6523 Occlusion and stenosis of bilateral carotid arteries: Secondary | ICD-10-CM

## 2016-01-06 DIAGNOSIS — E1122 Type 2 diabetes mellitus with diabetic chronic kidney disease: Secondary | ICD-10-CM | POA: Diagnosis not present

## 2016-01-06 DIAGNOSIS — I739 Peripheral vascular disease, unspecified: Secondary | ICD-10-CM | POA: Diagnosis not present

## 2016-01-06 DIAGNOSIS — I5032 Chronic diastolic (congestive) heart failure: Secondary | ICD-10-CM | POA: Diagnosis not present

## 2016-01-06 DIAGNOSIS — N184 Chronic kidney disease, stage 4 (severe): Secondary | ICD-10-CM | POA: Diagnosis not present

## 2016-01-06 DIAGNOSIS — R928 Other abnormal and inconclusive findings on diagnostic imaging of breast: Secondary | ICD-10-CM | POA: Diagnosis not present

## 2016-01-06 DIAGNOSIS — I480 Paroxysmal atrial fibrillation: Secondary | ICD-10-CM

## 2016-01-06 DIAGNOSIS — Z9889 Other specified postprocedural states: Secondary | ICD-10-CM | POA: Insufficient documentation

## 2016-01-06 DIAGNOSIS — L89620 Pressure ulcer of left heel, unstageable: Secondary | ICD-10-CM | POA: Diagnosis not present

## 2016-01-06 DIAGNOSIS — E785 Hyperlipidemia, unspecified: Secondary | ICD-10-CM | POA: Diagnosis not present

## 2016-01-06 DIAGNOSIS — I779 Disorder of arteries and arterioles, unspecified: Secondary | ICD-10-CM

## 2016-01-06 DIAGNOSIS — I130 Hypertensive heart and chronic kidney disease with heart failure and stage 1 through stage 4 chronic kidney disease, or unspecified chronic kidney disease: Secondary | ICD-10-CM | POA: Diagnosis not present

## 2016-01-06 DIAGNOSIS — Z5181 Encounter for therapeutic drug level monitoring: Secondary | ICD-10-CM

## 2016-01-06 DIAGNOSIS — Z48812 Encounter for surgical aftercare following surgery on the circulatory system: Secondary | ICD-10-CM | POA: Diagnosis not present

## 2016-01-06 DIAGNOSIS — N186 End stage renal disease: Secondary | ICD-10-CM | POA: Diagnosis not present

## 2016-01-06 DIAGNOSIS — I5042 Chronic combined systolic (congestive) and diastolic (congestive) heart failure: Secondary | ICD-10-CM | POA: Diagnosis not present

## 2016-01-06 DIAGNOSIS — I2510 Atherosclerotic heart disease of native coronary artery without angina pectoris: Secondary | ICD-10-CM | POA: Insufficient documentation

## 2016-01-06 DIAGNOSIS — I129 Hypertensive chronic kidney disease with stage 1 through stage 4 chronic kidney disease, or unspecified chronic kidney disease: Secondary | ICD-10-CM | POA: Diagnosis not present

## 2016-01-06 DIAGNOSIS — I770 Arteriovenous fistula, acquired: Secondary | ICD-10-CM

## 2016-01-06 DIAGNOSIS — K219 Gastro-esophageal reflux disease without esophagitis: Secondary | ICD-10-CM | POA: Diagnosis not present

## 2016-01-06 DIAGNOSIS — N185 Chronic kidney disease, stage 5: Secondary | ICD-10-CM

## 2016-01-06 LAB — VAS US CAROTID
LCCADDIAS: -22 cm/s
LCCAPSYS: 54 cm/s
Left CCA dist sys: -94 cm/s
Left CCA prox dias: 10 cm/s
Left ICA dist dias: -17 cm/s
Left ICA dist sys: -61 cm/s
Left ICA prox dias: -28 cm/s
Left ICA prox sys: -142 cm/s
RCCADSYS: -51 cm/s
RCCAPDIAS: 8 cm/s
RCCAPSYS: 97 cm/s
RIGHT CCA MID DIAS: 7 cm/s
RIGHT ECA DIAS: 0 cm/s
RIGHT VERTEBRAL DIAS: 13 cm/s

## 2016-01-06 LAB — POCT INR: INR: 1.7

## 2016-01-06 NOTE — Progress Notes (Addendum)
[facility] [place] HISTORY AND PHYSICAL   MRN : [id number]  History of Present Illness:   [name] is a 82 y.o. male patient of Dr. Scot Dock who is s/p left brachiocephalic AV fistula on 123456. He is also s/p Right carotid endarterectomy 01/29/2001; Left common carotid artery stenosis repair with interposition graft 12/30/1999; Left carotid patch angioplasty 12/01/1998; Left carotid endarterectomy 04/30/1989. He returns today for carotid artery stenosis follow up. He was having some work done by his podiatrist who was concerned about his circulation. He underwent a Doppler study at another lab which showed an ABI of 67% on the right and 72% on the left.  He denies any history of claudication, although his activity seems fairly limited. He denies any history of rest pain. He is non smoker.  He remains asymptomatic from a carotid standpoint. He denies any tingling, numbness, or aching in either hand or arm.  He denies any history of stroke or TIA. Specifically he denies any history of hemiparesis, denies symptoms of aphasia, denies monocular vision loss, denies unilateral facial drooping.  He was hospitalized at [facility] in March 2017 for fluid overload, started HD then. He was in rehab after this and developed a left heel decubitus ulcer during his stay there; wife states this is healing and contracting with daily Santyl dressing changes assisted by HH once/week.  Wife states he ambulates with a walker at home, is in a w/c in the office today. She states he is also unsteady on his feet. He has a history of shingles on his right leg.  He was started on Namenda.   He had a cardioversion early September, 2015.  Pt Diabetic: Yes, 6.3 A1C April 2017 (review of records), in good control  Pt smoker: non-smoker  Pt meds include: Statin : Yes ASA: No Other anticoagulants/antiplatelets: coumadin for atrial fib     Current Outpatient Prescriptions  Medication Sig  Dispense Refill  . acetaminophen (TYLENOL) 325 MG tablet Take 2 tablets (650 mg total) by mouth every 4 (four) hours as needed for headache or mild pain.    Marland Kitchen atorvastatin (LIPITOR) 80 MG tablet TAKE ONE TABLET BY MOUTH ONCE DAILY 90 tablet 3  . cholecalciferol (VITAMIN D) 1000 UNITS tablet Take 1,000 Units by mouth daily at 6 PM. D3    . geriatric multivitamins-minerals (ELDERTONIC/GEVRABON) ELIX Take 15 mLs by mouth 2 (two) times daily.     . insulin aspart protamine- aspart (NOVOLOG MIX 70/30) (70-30) 100 UNIT/ML injection Inject 8 Units into the skin 2 (two) times daily with a meal.    . Insulin Glargine (LANTUS SOLOSTAR) 100 UNIT/ML SOPN Inject 24 Units into the skin daily.     . isosorbide mononitrate (IMDUR) 30 MG 24 hr tablet Take 30 mg by mouth daily. PATIENT TAKES HALF A TABLET (15MG ) BY MOUTH DAILY.    Marland Kitchen levothyroxine (SYNTHROID, LEVOTHROID) 175 MCG tablet Take 175 mcg by mouth daily before breakfast.    . memantine (NAMENDA) 5 MG tablet Take 5 mg by mouth daily.     . Multiple Vitamin (MULTIVITAMIN) tablet Take 1 tablet by mouth at bedtime.    . Multiple Vitamins-Minerals (DECUBI-VITE) CAPS Take 1 capsule by mouth daily.    . OXYGEN Inhale 4 L/min into the lungs continuous. To keep O2 sats >90    . pantoprazole (PROTONIX) 40 MG tablet Take 1 tablet (40 mg total) by mouth daily at 6 (six) AM.    . polyethylene glycol (MIRALAX / GLYCOLAX) packet  Take 17 g by mouth 2 (two) times daily.    . Protein (PROCEL) POWD Take 2 scoop by mouth 2 (two) times daily.    . ranitidine (ZANTAC) 300 MG tablet Take 300 mg by mouth daily.    . sennosides-docusate sodium (SENOKOT-S) 8.6-50 MG tablet Take 2 tablets by mouth 2 (two) times daily.    . sevelamer carbonate (RENVELA) 2.4 g PACK Take 2.4 g by mouth 3 (three) times daily. 90 each   . warfarin (COUMADIN) 5 MG tablet TAKE AS DIRECTED 90 tablet 0  . albuterol (PROVENTIL) (2.5 MG/3ML) 0.083% nebulizer solution Take 3 mLs (2.5 mg total) by nebulization  every 6 (six) hours as needed for wheezing or shortness of breath. (Patient not taking: Reported on 01/06/2016) 75 mL 12  . bisacodyl (DULCOLAX) 10 MG suppository Place 10 mg rectally daily as needed for moderate constipation. Reported on 01/06/2016    . guaifenesin (ROBITUSSIN) 100 MG/5ML syrup Take 10 mLs (200 mg total) by mouth every 6 (six) hours as needed for cough or congestion. (Patient not taking: Reported on 01/06/2016) 120 mL 0  . ondansetron (ZOFRAN) 4 MG tablet Take 4 mg by mouth every 6 (six) hours as needed for nausea or vomiting. Reported on 01/06/2016     No current facility-administered medications for this visit.    Past Medical History  Diagnosis Date  . Hypertension   . Hyperlipidemia   . Hypothyroidism   . CAD (coronary artery disease)     a. prior CABG in 1997 with redo in 2000. b. last cath in 2008 - managed medically  . Type 2 diabetes mellitus ([place])   . Generalized weakness     without overt findings  . Peripheral vascular disease ([place])   . Carotid disease, bilateral ([place])     with multiple bilateral carotid surgeries  . Psoriasis   . Meniere's disease   . Degenerative joint disease   . Gout   . Orthopnea     Two-pillow  . CKD (chronic kidney disease), stage IV ([place])     a. Has fistula in place.   . Chronic diastolic CHF (congestive heart failure) ([place])   . GERD (gastroesophageal reflux disease)   . Atrial fibrillation ([place]) Aug. 2015    a. Dx 01/2014, s/p DCCV 03/06/14.  . Sinus bradycardia     a. Toprol D/C'd due to this.   . Shingles   . On home oxygen therapy     "2L" qhs (09/09/2015  . Dementia   . Chronic respiratory failure with hypoxia [facility])     Social History Social History  Substance Use Topics  . Smoking status: Never Smoker   . Smokeless tobacco: Never Used  . Alcohol Use: No    Family History Family History  Problem Relation Age of Onset  . Heart attack Father   . Heart disease Father     After 56 yrs of age  . Hyperlipidemia  Father   . Hypertension Father   . Diabetes Mother   . Hypertension Mother   . Heart disease Mother   . Heart disease Brother   . Diabetes Brother     Surgical History Past Surgical History  Procedure Laterality Date  . Coronary artery bypass graft  1997;  2002  . Carotid endarterectomy Bilateral "several times"    "5 on right; 2-3 on left" (09/09/2015)  . Lung removal, partial    . Lumbar laminectomy  July 2001  . Thoracotomy Left     due  to fungal infection  . Shoulder surgery Bilateral   . Cardiac catheterization    . Appendectomy    . Av fistula placement Left 01/21/2013    Procedure: ARTERIOVENOUS (AV) FISTULA CREATION, Left Brachiocephalic;  Surgeon: Angelia Mould, MD;  Location: [place];  Service: Vascular;  Laterality: Left;  . Cardiac catheterization  2008    L main irreg, LAD 80%, IMA-LAD & SVG-Diag patent, CFX 100%, SVG-OM 90%, RCA 70%, SVG-RCA OK, EF nl, med rx, no vessels appropriate for PCI  . Tee without cardioversion N/A 03/06/2014    Procedure: TRANSESOPHAGEAL ECHOCARDIOGRAM (TEE);  Surgeon: Larey Dresser, MD;  Location: [place];  Service: Cardiovascular;  Laterality: N/A;  . Cardioversion N/A 03/06/2014    Procedure: CARDIOVERSION;  Surgeon: Larey Dresser, MD;  Location: [facility] ENDOSCOPY;  Service: Cardiovascular;  Laterality: N/A;  . Blepharoplasty Bilateral     Allergies  Allergen Reactions  . Betadine [Povidone Iodine] Rash  . Iodinated Diagnostic Agents Rash and Other (See Comments)    Does fine with premedications for cath  . Latex Hives    Current Outpatient Prescriptions  Medication Sig Dispense Refill  . acetaminophen (TYLENOL) 325 MG tablet Take 2 tablets (650 mg total) by mouth every 4 (four) hours as needed for headache or mild pain.    Marland Kitchen atorvastatin (LIPITOR) 80 MG tablet TAKE ONE TABLET BY MOUTH ONCE DAILY 90 tablet 3  . cholecalciferol (VITAMIN D) 1000 UNITS tablet Take 1,000 Units by mouth daily at 6 PM. D3    . geriatric  multivitamins-minerals (ELDERTONIC/GEVRABON) ELIX Take 15 mLs by mouth 2 (two) times daily.     . insulin aspart protamine- aspart (NOVOLOG MIX 70/30) (70-30) 100 UNIT/ML injection Inject 8 Units into the skin 2 (two) times daily with a meal.    . Insulin Glargine (LANTUS SOLOSTAR) 100 UNIT/ML SOPN Inject 24 Units into the skin daily.     . isosorbide mononitrate (IMDUR) 30 MG 24 hr tablet Take 30 mg by mouth daily. PATIENT TAKES HALF A TABLET (15MG ) BY MOUTH DAILY.    Marland Kitchen levothyroxine (SYNTHROID, LEVOTHROID) 175 MCG tablet Take 175 mcg by mouth daily before breakfast.    . memantine (NAMENDA) 5 MG tablet Take 5 mg by mouth daily.     . Multiple Vitamin (MULTIVITAMIN) tablet Take 1 tablet by mouth at bedtime.    . Multiple Vitamins-Minerals (DECUBI-VITE) CAPS Take 1 capsule by mouth daily.    . OXYGEN Inhale 4 L/min into the lungs continuous. To keep O2 sats >90    . pantoprazole (PROTONIX) 40 MG tablet Take 1 tablet (40 mg total) by mouth daily at 6 (six) AM.    . polyethylene glycol (MIRALAX / GLYCOLAX) packet Take 17 g by mouth 2 (two) times daily.    . Protein (PROCEL) POWD Take 2 scoop by mouth 2 (two) times daily.    . ranitidine (ZANTAC) 300 MG tablet Take 300 mg by mouth daily.    . sennosides-docusate sodium (SENOKOT-S) 8.6-50 MG tablet Take 2 tablets by mouth 2 (two) times daily.    . sevelamer carbonate (RENVELA) 2.4 g PACK Take 2.4 g by mouth 3 (three) times daily. 90 each   . warfarin (COUMADIN) 5 MG tablet TAKE AS DIRECTED 90 tablet 0  . albuterol (PROVENTIL) (2.5 MG/3ML) 0.083% nebulizer solution Take 3 mLs (2.5 mg total) by nebulization every 6 (six) hours as needed for wheezing or shortness of breath. (Patient not taking: Reported on 01/06/2016) 75 mL 12  . bisacodyl (DULCOLAX)  10 MG suppository Place 10 mg rectally daily as needed for moderate constipation. Reported on 01/06/2016    . guaifenesin (ROBITUSSIN) 100 MG/5ML syrup Take 10 mLs (200 mg total) by mouth every 6 (six) hours as  needed for cough or congestion. (Patient not taking: Reported on 01/06/2016) 120 mL 0  . ondansetron (ZOFRAN) 4 MG tablet Take 4 mg by mouth every 6 (six) hours as needed for nausea or vomiting. Reported on 01/06/2016     No current facility-administered medications for this visit.     REVIEW OF SYSTEMS: See HPI for pertinent positives and negatives.  Physical Examination Filed Vitals:   01/06/16 1559  BP: 114/56  Pulse: 68  Temp: 98 F (36.7 C)  TempSrc: Oral  Resp: 16  Height: 5\' 10"  (1.778 m)  Weight: 169 lb (76.658 kg)  SpO2: 94%   Body mass index is 24.25 kg/(m^2).  General: WDWN male in NAD, with wife GAIT: normal Eyes: PERRLA Pulmonary: Respirations are non-labored, CTAB Cardiac: regular rhythm and rate,+ murmur.  VASCULAR EXAM Carotid Bruits Right Left   Positive Positive   Aorta is not palpable. Radial pulses: right is 2+ palpable, left is 1+ palpable.Left upper arm AVF has a palpable thrill, is moderately aneurysmal.      LE Pulses Right Left   FEMORAL 2+ palpable 2+ palpable    POPLITEAL not palpable  not palpable   POSTERIOR TIBIAL not palpable, audible with Doppler  not palpable, audible with Doppler    DORSALIS PEDIS  ANTERIOR TIBIAL not palpable, audible with Doppler  Not palpable, audible with Doppler     Gastrointestinal: soft, nontender, BS WNL, no r/g, reducible ventral hernia.  Musculoskeletal: Generalized muscle atrophy/wasting/deconditioning. M/S 4/5 throughout. 1-2+ pitting edema in both ankles and feet. 3 cm x 4 cm healing dry shallow decubitus ulcer at left heel.  Skin: Slightly pale and jaundiced.  Neurologic: A&O X 2; Appropriate Affect, Speech is normal CN 2-12 intact, Pain and light touch intact in extremities, Motor exam as listed  above.               Non-Invasive Vascular Imaging (01/06/2016):  CEREBROVASCULAR DUPLEX EVALUATION    INDICATION: Carotid disease    PREVIOUS INTERVENTION(S): History of bilateral carotid endarterectomy, left interpositional graft.  Occluded left external carotid artery.    DUPLEX EXAM: Bilateral carotid duplex    RIGHT  LEFT  Peak Systolic Velocities (cm/s) End Diastolic Velocities (cm/s) Plaque LOCATION Peak Systolic Velocities (cm/s) End Diastolic Velocities (cm/s) Plaque  97 8  CCA PROXIMAL 54 10   70 7  CCA MID 80 24   131 15 HT CCA DISTAL 94 22 HT  188 0 HT ECA   Known occlusion  331 80 HT,CP ICA PROXIMAL 142 28 HT  70 12  ICA MID 70 18   51 14  ICA DISTAL 61 17     Antegrade Vertebral Flow Antegrade   Brachial Systolic Pressure (mmHg)   Triphasic Subclavian Artery Waveforms Triphasic    Plaque Morphology:  HM = Homogeneous, HT = Heterogeneous, CP = Calcific Plaque, SP = Smooth Plaque, IP = Irregular Plaque     ADDITIONAL FINDINGS:     IMPRESSION: 1.  60-80% right internal carotid artery stenosis.   2. 1-39% left internal carotid artery stenosis. 3. Known left external carotid artery occlusion     Compared to the previous exam:  Right internal carotid artery velocities obtained today are between the velocities obtained on the previous two studies. (624/106 on 11-25-14  and 273/41 on 06-02-15.)  Calcific plaque may obscure higher velocities.    ABI (Date: 01/06/2016) R: [place], DP: monophasic, PT: monophasic, TBI: 0.44, toe pressure: 64 L: [place], DP: monophasic, PT: monophasic, TBI: 0.26, toe pressure: 37   ASSESSMENT:  [name] is a 82 y.o. male who is s/p left brachiocephalic AV fistula on 123456. He is also s/p Right carotid endarterectomy 01/29/2001; Left common carotid artery stenosis repair with interposition graft 12/30/1999; Left carotid patch angioplasty 12/01/1998; Left carotid endarterectomy 04/30/1989.  He was having some work done by his  podiatrist who was concerned about his circulation. He underwent a Doppler study at another lab which showed an ABI of 67% on the right and 72% on the left.  Femoral pulses are 2+ palpable. Pedal pulses are Dopplerable but not palpable. Feet and toes are pink and warm. ABI's today do not correspond with monophasic waveforms throughout. TBI's are abnormal.  He denies any history of claudication, although his activity seems fairly limited. He denies any history of rest pain other than the healing decubitus ulcer on his left heel that he developed while in rehab in March 2017. HH assists with daily Santyl dressings for this.   He is non smoker.   He started HD in his left upper arm AV fistula in March of 2017 when hospitalized for fluid overload. He dialyzes M-W-F.   He has no history of stroke or TIA. He denies any tingling, numbness, or aching in either hand or arm.  Today's carotid duplex suggests  60-80% right internal carotid artery stenosis.   1-39% left internal carotid artery stenosis. Known left external carotid artery occlusion. No significant change from the previous carotid duplex on 06/02/15 and 11/25/14.  Face to face time with patient was 25 minutes. Over 50% of this time was spent on counseling and coordination of care.     PLAN:   Daily seated leg exercises demonstrated and discussed.  Based on today's exam and non-invasive vascular lab results, the patient will follow up in 6 months with carotid duplex and ABI's. I discussed in depth with the patient the nature of atherosclerosis, and emphasized the importance of maximal medical management including strict control of blood pressure, blood glucose, and lipid levels, obtaining regular exercise, and cessation of smoking.  The patient is aware that without maximal medical management the underlying atherosclerotic disease process will progress, limiting the benefit of any interventions.  The patient was given information about  stroke prevention and what symptoms should prompt the patient to seek immediate medical care.  The patient was given information about PAD including signs, symptoms, treatment, what symptoms should prompt the patient to seek immediate medical care, and risk reduction measures to take. Thank you for allowing [place] to participate in this patient's care.  Clemon Chambers, RN, MSN, FNP-C [facility] Office: [phone]  Clinic MD: [facility] 01/06/2016 4:03 PM

## 2016-01-06 NOTE — Patient Instructions (Signed)
Stroke Prevention Some medical conditions and behaviors are associated with an increased chance of having a stroke. You may prevent a stroke by making healthy choices and managing medical conditions. HOW CAN I REDUCE MY RISK OF HAVING A STROKE?   Stay physically active. Get at least 30 minutes of activity on most or all days.  Do not smoke. It may also be helpful to avoid exposure to secondhand smoke.  Limit alcohol use. Moderate alcohol use is considered to be:  No more than 2 drinks per day for men.  No more than 1 drink per day for nonpregnant women.  Eat healthy foods. This involves:  Eating 5 or more servings of fruits and vegetables a day.  Making dietary changes that address high blood pressure (hypertension), high cholesterol, diabetes, or obesity.  Manage your cholesterol levels.  Making food choices that are high in fiber and low in saturated fat, trans fat, and cholesterol may control cholesterol levels.  Take any prescribed medicines to control cholesterol as directed by your health care provider.  Manage your diabetes.  Controlling your carbohydrate and sugar intake is recommended to manage diabetes.  Take any prescribed medicines to control diabetes as directed by your health care provider.  Control your hypertension.  Making food choices that are low in salt (sodium), saturated fat, trans fat, and cholesterol is recommended to manage hypertension.  Ask your health care provider if you need treatment to lower your blood pressure. Take any prescribed medicines to control hypertension as directed by your health care provider.  If you are 18-39 years of age, have your blood pressure checked every 3-5 years. If you are 40 years of age or older, have your blood pressure checked every year.  Maintain a healthy weight.  Reducing calorie intake and making food choices that are low in sodium, saturated fat, trans fat, and cholesterol are recommended to manage  weight.  Stop drug abuse.  Avoid taking birth control pills.  Talk to your health care provider about the risks of taking birth control pills if you are over 35 years old, smoke, get migraines, or have ever had a blood clot.  Get evaluated for sleep disorders (sleep apnea).  Talk to your health care provider about getting a sleep evaluation if you snore a lot or have excessive sleepiness.  Take medicines only as directed by your health care provider.  For some people, aspirin or blood thinners (anticoagulants) are helpful in reducing the risk of forming abnormal blood clots that can lead to stroke. If you have the irregular heart rhythm of atrial fibrillation, you should be on a blood thinner unless there is a good reason you cannot take them.  Understand all your medicine instructions.  Make sure that other conditions (such as anemia or atherosclerosis) are addressed. SEEK IMMEDIATE MEDICAL CARE IF:   You have sudden weakness or numbness of the face, arm, or leg, especially on one side of the body.  Your face or eyelid droops to one side.  You have sudden confusion.  You have trouble speaking (aphasia) or understanding.  You have sudden trouble seeing in one or both eyes.  You have sudden trouble walking.  You have dizziness.  You have a loss of balance or coordination.  You have a sudden, severe headache with no known cause.  You have new chest pain or an irregular heartbeat. Any of these symptoms may represent a serious problem that is an emergency. Do not wait to see if the symptoms will   go away. Get medical help at once. Call your local emergency services (911 in [place] Do not drive yourself to the hospital.   This information is not intended to replace advice given to you by your health care provider. Make sure you discuss any questions you have with your health care provider.   Document Released: 07/20/2004 Document Revised: 07/03/2014 Document Reviewed:  12/13/2012 Elsevier Interactive Patient Education 2016 [employer]    Peripheral Vascular Disease Peripheral vascular disease (PVD) is a disease of the blood vessels that are not part of your heart and brain. A simple term for PVD is poor circulation. In most cases, PVD narrows the blood vessels that carry blood from your heart to the rest of your body. This can result in a decreased supply of blood to your arms, legs, and internal organs, like your stomach or kidneys. However, it most often affects a person's lower legs and feet. There are two types of PVD.  Organic PVD. This is the more common type. It is caused by damage to the structure of blood vessels.  Functional PVD. This is caused by conditions that make blood vessels contract and tighten (spasm). Without treatment, PVD tends to get worse over time. PVD can also lead to acute ischemic limb. This is when an arm or limb suddenly has trouble getting enough blood. This is a medical emergency. CAUSES Each type of PVD has many different causes. The most common cause of PVD is buildup of a fatty material (plaque) inside of your arteries (atherosclerosis). Small amounts of plaque can break off from the walls of the blood vessels and become lodged in a smaller artery. This blocks blood flow and can cause acute ischemic limb. Other common causes of PVD include:  Blood clots that form inside of blood vessels.  Injuries to blood vessels.  Diseases that cause inflammation of blood vessels or cause blood vessel spasms.  Health behaviors and health history that increase your risk of developing PVD. RISK FACTORS  You may have a greater risk of PVD if you:  Have a family history of PVD.  Have certain medical conditions, including:  High cholesterol.  Diabetes.  High blood pressure (hypertension).  Coronary heart disease.  Past problems with blood clots.  Past injury, such as burns or a broken bone. These may have damaged blood  vessels in your limbs.  Buerger disease. This is caused by inflamed blood vessels in your hands and feet.  Some forms of arthritis.  Rare birth defects that affect the arteries in your legs.  Use tobacco.  Do not get enough exercise.  Are obese.  Are age 50 or older. SIGNS AND SYMPTOMS  PVD may cause many different symptoms. Your symptoms depend on what part of your body is not getting enough blood. Some common signs and symptoms include:  Cramps in your lower legs. This may be a symptom of poor leg circulation (claudication).  Pain and weakness in your legs while you are physically active that goes away when you rest (intermittent claudication).  Leg pain when at rest.  Leg numbness, tingling, or weakness.  Coldness in a leg or foot, especially when compared with the other leg.  Skin or hair changes. These can include:  Hair loss.  Shiny skin.  Pale or bluish skin.  Thick toenails.  Inability to get or maintain an erection (erectile dysfunction). People with PVD are more prone to developing ulcers and sores on their toes, feet, or legs. These may take longer than   normal to heal. DIAGNOSIS Your health care provider may diagnose PVD from your signs and symptoms. The health care provider will also do a physical exam. You may have tests to find out what is causing your PVD and determine its severity. Tests may include:  Blood pressure recordings from your arms and legs and measurements of the strength of your pulses (pulse volume recordings).  Imaging studies using sound waves to take pictures of the blood flow through your blood vessels (Doppler ultrasound).  Injecting a dye into your blood vessels before having imaging studies using:  X-rays (angiogram or arteriogram).  Computer-generated X-rays (CT angiogram).  A powerful electromagnetic field and a computer (magnetic resonance angiogram or MRA). TREATMENT Treatment for PVD depends on the cause of your condition  and the severity of your symptoms. It also depends on your age. Underlying causes need to be treated and controlled. These include long-lasting (chronic) conditions, such as diabetes, high cholesterol, and high blood pressure. You may need to first try making lifestyle changes and taking medicines. Surgery may be needed if these do not work. Lifestyle changes may include:  Quitting smoking.  Exercising regularly.  Following a low-fat, low-cholesterol diet. Medicines may include:  Blood thinners to prevent blood clots.  Medicines to improve blood flow.  Medicines to improve your blood cholesterol levels. Surgical procedures may include:  A procedure that uses an inflated balloon to open a blocked artery and improve blood flow (angioplasty).  A procedure to put in a tube (stent) to keep a blocked artery open (stent implant).  Surgery to reroute blood flow around a blocked artery (peripheral bypass surgery).  Surgery to remove dead tissue from an infected wound on the affected limb.  Amputation. This is surgical removal of the affected limb. This may be necessary in cases of acute ischemic limb that are not improved through medical or surgical treatments. HOME CARE INSTRUCTIONS  Take medicines only as directed by your health care provider.  Do not use any tobacco products, including cigarettes, chewing tobacco, or electronic cigarettes. If you need help quitting, ask your health care provider.  Lose weight if you are overweight, and maintain a healthy weight as directed by your health care provider.  Eat a diet that is low in fat and cholesterol. If you need help, ask your health care provider.  Exercise regularly. Ask your health care provider to suggest some good activities for you.  Use compression stockings or other mechanical devices as directed by your health care provider.  Take good care of your feet.  Wear comfortable shoes that fit well.  Check your feet often for  any cuts or sores. SEEK MEDICAL CARE IF:  You have cramps in your legs while walking.  You have leg pain when you are at rest.  You have coldness in a leg or foot.  Your skin changes.  You have erectile dysfunction.  You have cuts or sores on your feet that are not healing. SEEK IMMEDIATE MEDICAL CARE IF:  Your arm or leg turns cold and blue.  Your arms or legs become red, warm, swollen, painful, or numb.  You have chest pain or trouble breathing.  You suddenly have weakness in your face, arm, or leg.  You become very confused or lose the ability to speak.  You suddenly have a very bad headache or lose your vision.   This information is not intended to replace advice given to you by your health care provider. Make sure you discuss any questions   you have with your health care provider.   Document Released: 07/20/2004 Document Revised: 07/03/2014 Document Reviewed: 11/20/2013 Elsevier Interactive Patient Education 2016 [employer]  

## 2016-01-07 DIAGNOSIS — D509 Iron deficiency anemia, unspecified: Secondary | ICD-10-CM | POA: Diagnosis not present

## 2016-01-07 DIAGNOSIS — N186 End stage renal disease: Secondary | ICD-10-CM | POA: Diagnosis not present

## 2016-01-07 DIAGNOSIS — E1129 Type 2 diabetes mellitus with other diabetic kidney complication: Secondary | ICD-10-CM | POA: Diagnosis not present

## 2016-01-10 ENCOUNTER — Encounter (HOSPITAL_COMMUNITY): Payer: Self-pay | Admitting: Emergency Medicine

## 2016-01-10 ENCOUNTER — Emergency Department (HOSPITAL_COMMUNITY): Payer: Medicare Other

## 2016-01-10 ENCOUNTER — Inpatient Hospital Stay (HOSPITAL_COMMUNITY)
Admission: EM | Admit: 2016-01-10 | Discharge: 2016-01-13 | DRG: 286 | Disposition: A | Discharge status: Home Health | Payer: Medicare Other | Attending: Internal Medicine | Admitting: Internal Medicine

## 2016-01-10 DIAGNOSIS — I129 Hypertensive chronic kidney disease with stage 1 through stage 4 chronic kidney disease, or unspecified chronic kidney disease: Secondary | ICD-10-CM | POA: Diagnosis present

## 2016-01-10 DIAGNOSIS — E8889 Other specified metabolic disorders: Secondary | ICD-10-CM | POA: Diagnosis not present

## 2016-01-10 DIAGNOSIS — Z794 Long term (current) use of insulin: Secondary | ICD-10-CM

## 2016-01-10 DIAGNOSIS — E1122 Type 2 diabetes mellitus with diabetic chronic kidney disease: Secondary | ICD-10-CM | POA: Diagnosis present

## 2016-01-10 DIAGNOSIS — D638 Anemia in other chronic diseases classified elsewhere: Secondary | ICD-10-CM | POA: Diagnosis present

## 2016-01-10 DIAGNOSIS — E11621 Type 2 diabetes mellitus with foot ulcer: Secondary | ICD-10-CM | POA: Diagnosis present

## 2016-01-10 DIAGNOSIS — R079 Chest pain, unspecified: Secondary | ICD-10-CM | POA: Diagnosis not present

## 2016-01-10 DIAGNOSIS — K219 Gastro-esophageal reflux disease without esophagitis: Secondary | ICD-10-CM | POA: Diagnosis present

## 2016-01-10 DIAGNOSIS — I132 Hypertensive heart and chronic kidney disease with heart failure and with stage 5 chronic kidney disease, or end stage renal disease: Secondary | ICD-10-CM | POA: Diagnosis present

## 2016-01-10 DIAGNOSIS — N186 End stage renal disease: Secondary | ICD-10-CM | POA: Diagnosis not present

## 2016-01-10 DIAGNOSIS — R0789 Other chest pain: Secondary | ICD-10-CM | POA: Diagnosis not present

## 2016-01-10 DIAGNOSIS — Z992 Dependence on renal dialysis: Secondary | ICD-10-CM

## 2016-01-10 DIAGNOSIS — I350 Nonrheumatic aortic (valve) stenosis: Secondary | ICD-10-CM | POA: Diagnosis present

## 2016-01-10 DIAGNOSIS — I5042 Chronic combined systolic (congestive) and diastolic (congestive) heart failure: Secondary | ICD-10-CM | POA: Diagnosis present

## 2016-01-10 DIAGNOSIS — I2582 Chronic total occlusion of coronary artery: Secondary | ICD-10-CM | POA: Diagnosis present

## 2016-01-10 DIAGNOSIS — Z66 Do not resuscitate: Secondary | ICD-10-CM | POA: Diagnosis present

## 2016-01-10 DIAGNOSIS — E1151 Type 2 diabetes mellitus with diabetic peripheral angiopathy without gangrene: Secondary | ICD-10-CM | POA: Diagnosis present

## 2016-01-10 DIAGNOSIS — L89620 Pressure ulcer of left heel, unstageable: Secondary | ICD-10-CM | POA: Diagnosis present

## 2016-01-10 DIAGNOSIS — D509 Iron deficiency anemia, unspecified: Secondary | ICD-10-CM | POA: Diagnosis not present

## 2016-01-10 DIAGNOSIS — E785 Hyperlipidemia, unspecified: Secondary | ICD-10-CM | POA: Diagnosis present

## 2016-01-10 DIAGNOSIS — I25710 Atherosclerosis of autologous vein coronary artery bypass graft(s) with unstable angina pectoris: Secondary | ICD-10-CM | POA: Diagnosis not present

## 2016-01-10 DIAGNOSIS — I25110 Atherosclerotic heart disease of native coronary artery with unstable angina pectoris: Secondary | ICD-10-CM | POA: Diagnosis present

## 2016-01-10 DIAGNOSIS — Z7901 Long term (current) use of anticoagulants: Secondary | ICD-10-CM

## 2016-01-10 DIAGNOSIS — L409 Psoriasis, unspecified: Secondary | ICD-10-CM | POA: Diagnosis present

## 2016-01-10 DIAGNOSIS — D631 Anemia in chronic kidney disease: Secondary | ICD-10-CM | POA: Diagnosis present

## 2016-01-10 DIAGNOSIS — Z951 Presence of aortocoronary bypass graft: Secondary | ICD-10-CM

## 2016-01-10 DIAGNOSIS — L8990 Pressure ulcer of unspecified site, unspecified stage: Secondary | ICD-10-CM | POA: Insufficient documentation

## 2016-01-10 DIAGNOSIS — E039 Hypothyroidism, unspecified: Secondary | ICD-10-CM | POA: Diagnosis present

## 2016-01-10 DIAGNOSIS — I480 Paroxysmal atrial fibrillation: Secondary | ICD-10-CM | POA: Diagnosis present

## 2016-01-10 DIAGNOSIS — J9611 Chronic respiratory failure with hypoxia: Secondary | ICD-10-CM | POA: Diagnosis present

## 2016-01-10 DIAGNOSIS — L97429 Non-pressure chronic ulcer of left heel and midfoot with unspecified severity: Secondary | ICD-10-CM | POA: Diagnosis present

## 2016-01-10 DIAGNOSIS — M109 Gout, unspecified: Secondary | ICD-10-CM | POA: Diagnosis present

## 2016-01-10 DIAGNOSIS — Z9981 Dependence on supplemental oxygen: Secondary | ICD-10-CM

## 2016-01-10 DIAGNOSIS — E1129 Type 2 diabetes mellitus with other diabetic kidney complication: Secondary | ICD-10-CM | POA: Diagnosis not present

## 2016-01-10 DIAGNOSIS — I2510 Atherosclerotic heart disease of native coronary artery without angina pectoris: Secondary | ICD-10-CM | POA: Diagnosis present

## 2016-01-10 DIAGNOSIS — R0602 Shortness of breath: Secondary | ICD-10-CM | POA: Diagnosis not present

## 2016-01-10 DIAGNOSIS — F0390 Unspecified dementia without behavioral disturbance: Secondary | ICD-10-CM | POA: Diagnosis present

## 2016-01-10 DIAGNOSIS — I4891 Unspecified atrial fibrillation: Secondary | ICD-10-CM | POA: Diagnosis present

## 2016-01-10 LAB — I-STAT TROPONIN, ED
Troponin i, poc: 0.04 ng/mL (ref 0.00–0.08)
Troponin i, poc: 0.04 ng/mL (ref 0.00–0.08)

## 2016-01-10 LAB — BASIC METABOLIC PANEL
ANION GAP: 6 (ref 5–15)
BUN: 27 mg/dL — ABNORMAL HIGH (ref 6–20)
CALCIUM: 8 mg/dL — AB (ref 8.9–10.3)
CHLORIDE: 95 mmol/L — AB (ref 101–111)
CO2: 32 mmol/L (ref 22–32)
Creatinine, Ser: 3.18 mg/dL — ABNORMAL HIGH (ref 0.61–1.24)
GFR calc non Af Amer: 17 mL/min — ABNORMAL LOW (ref >60)
GFR, EST AFRICAN AMERICAN: 20 mL/min — AB (ref >60)
GLUCOSE: 174 mg/dL — AB (ref 65–99)
POTASSIUM: 4.1 mmol/L (ref 3.5–5.1)
Sodium: 133 mmol/L — ABNORMAL LOW (ref 135–145)

## 2016-01-10 LAB — CBC
HEMATOCRIT: 37 % — AB (ref 39.0–52.0)
HEMOGLOBIN: 11.6 g/dL — AB (ref 13.0–17.0)
MCH: 30.1 pg (ref 26.0–34.0)
MCHC: 31.4 g/dL (ref 30.0–36.0)
MCV: 95.9 fL (ref 78.0–100.0)
Platelets: 127 10*3/uL — ABNORMAL LOW (ref 150–400)
RBC: 3.86 MIL/uL — AB (ref 4.22–5.81)
RDW: 15.8 % — ABNORMAL HIGH (ref 11.5–15.5)
WBC: 5.2 10*3/uL (ref 4.0–10.5)

## 2016-01-10 LAB — PROTIME-INR
INR: 2.27 — AB (ref 0.00–1.49)
PROTHROMBIN TIME: 24.8 s — AB (ref 11.6–15.2)

## 2016-01-10 LAB — GLUCOSE, CAPILLARY: Glucose-Capillary: 167 mg/dL — ABNORMAL HIGH (ref 65–99)

## 2016-01-10 LAB — D-DIMER, QUANTITATIVE: D-Dimer, Quant: 0.69 ug/mL-FEU — ABNORMAL HIGH (ref 0.00–0.50)

## 2016-01-10 LAB — BRAIN NATRIURETIC PEPTIDE: B NATRIURETIC PEPTIDE 5: 540.5 pg/mL — AB (ref 0.0–100.0)

## 2016-01-10 LAB — TROPONIN I: Troponin I: 0.05 ng/mL (ref <0.03)

## 2016-01-10 MED ORDER — SEVELAMER CARBONATE 2.4 G PO PACK
2.4000 g | PACK | Freq: Three times a day (TID) | ORAL | Status: DC
  Administered 2016-01-11 – 2016-01-13 (×3): 2.4 g via ORAL
  Filled 2016-01-10 (×9): qty 1

## 2016-01-10 MED ORDER — ATORVASTATIN CALCIUM 80 MG PO TABS
80.0000 mg | ORAL_TABLET | Freq: Every day | ORAL | Status: DC
  Administered 2016-01-10 – 2016-01-12 (×3): 80 mg via ORAL
  Filled 2016-01-10 (×3): qty 1

## 2016-01-10 MED ORDER — DIPHENHYDRAMINE HCL 25 MG PO CAPS: 50.0000 mg | ORAL_CAPSULE | ORAL | Status: DC

## 2016-01-10 MED ORDER — MEMANTINE HCL 5 MG PO TABS
5.0000 mg | ORAL_TABLET | Freq: Every day | ORAL | Status: DC
  Administered 2016-01-11 – 2016-01-13 (×2): 5 mg via ORAL
  Filled 2016-01-10 (×2): qty 1

## 2016-01-10 MED ORDER — ADULT MULTIVITAMIN W/MINERALS CH
1.0000 | ORAL_TABLET | Freq: Every day | ORAL | Status: DC
  Administered 2016-01-11 – 2016-01-13 (×2): 1 via ORAL
  Filled 2016-01-10 (×2): qty 1

## 2016-01-10 MED ORDER — NITROGLYCERIN 0.4 MG SL SUBL
0.4000 mg | SUBLINGUAL_TABLET | SUBLINGUAL | Status: DC | PRN
Start: 2016-01-10 — End: 2016-01-13

## 2016-01-10 MED ORDER — ISOSORBIDE MONONITRATE ER 30 MG PO TB24
15.0000 mg | ORAL_TABLET | Freq: Every day | ORAL | Status: DC
  Administered 2016-01-11 – 2016-01-13 (×2): 15 mg via ORAL
  Filled 2016-01-10 (×2): qty 1

## 2016-01-10 MED ORDER — ACETAMINOPHEN 325 MG PO TABS: 650.0000 mg | ORAL_TABLET | ORAL | Status: DC | PRN

## 2016-01-10 MED ORDER — LEVOTHYROXINE SODIUM 75 MCG PO TABS
175.0000 ug | ORAL_TABLET | Freq: Every day | ORAL | Status: DC
  Administered 2016-01-11 – 2016-01-13 (×3): 175 ug via ORAL
  Filled 2016-01-10 (×3): qty 1

## 2016-01-10 MED ORDER — INSULIN ASPART 100 UNIT/ML ~~LOC~~ SOLN
0.0000 [IU] | Freq: Three times a day (TID) | SUBCUTANEOUS | Status: DC
  Administered 2016-01-11: 3 [IU] via SUBCUTANEOUS
  Administered 2016-01-12 – 2016-01-13 (×2): 5 [IU] via SUBCUTANEOUS

## 2016-01-10 MED ORDER — FAMOTIDINE 20 MG PO TABS
20.0000 mg | ORAL_TABLET | Freq: Two times a day (BID) | ORAL | Status: DC
  Administered 2016-01-10 – 2016-01-13 (×5): 20 mg via ORAL
  Filled 2016-01-10 (×5): qty 1

## 2016-01-10 MED ORDER — INSULIN GLARGINE 100 UNIT/ML ~~LOC~~ SOLN
5.0000 [IU] | Freq: Every day | SUBCUTANEOUS | Status: DC
  Administered 2016-01-11: 5 [IU] via SUBCUTANEOUS
  Filled 2016-01-10 (×3): qty 0.05

## 2016-01-10 MED ORDER — ONDANSETRON HCL 4 MG/2ML IJ SOLN: 4.0000 mg | Freq: Four times a day (QID) | INTRAMUSCULAR | Status: DC | PRN

## 2016-01-10 NOTE — ED Provider Notes (Signed)
CSN: [id number]     Arrival date & time 01/10/16  1528 History   First MD Initiated Contact with Patient 01/10/16 1546     Chief Complaint  Patient presents with  . Chest Pain   HPI   [name] is a 82 y.o. male PMH significant for Hypertension, hyperlipidemia, diabetes, bypass 2, CKD stage IV, diastolic congestive heart failure, sinus bradycardia presenting with a an episode of chest pain while at dialysis today. Halfway through his dialysis session, patient began having left-sided chest pain that radiates to his back, is 10/10 pain scale, aching. When patient arrived to ED, his chest pain had resolved after one nitroglycerin and 324 mg aspirin. He denies fevers, chills, cough, shortness of breath, abdominal pain, nausea, vomiting.  Past Medical History  Diagnosis Date  . Hypertension   . Hyperlipidemia   . Hypothyroidism   . CAD (coronary artery disease)     a. prior CABG in 1997 with redo in 2000. b. last cath in 2008 - managed medically  . Type 2 diabetes mellitus ([place])   . Generalized weakness     without overt findings  . Peripheral vascular disease ([place])   . Carotid disease, bilateral ([place])     with multiple bilateral carotid surgeries  . Psoriasis   . Meniere's disease   . Degenerative joint disease   . Gout   . Orthopnea     Two-pillow  . CKD (chronic kidney disease), stage IV ([place])     a. Has fistula in place.   . Chronic diastolic CHF (congestive heart failure) ([place])   . GERD (gastroesophageal reflux disease)   . Atrial fibrillation ([place]) Aug. 2015    a. Dx 01/2014, s/p DCCV 03/06/14.  . Sinus bradycardia     a. Toprol D/C'd due to this.   . Shingles   . On home oxygen therapy     "2L" qhs (09/09/2015  . Dementia   . Chronic respiratory failure with hypoxia [facility])    Past Surgical History  Procedure Laterality Date  . Coronary artery bypass graft  1997;  2002  . Carotid endarterectomy Bilateral "several times"    "5 on right; 2-3 on left" (09/09/2015)  .  Lung removal, partial    . Lumbar laminectomy  July 2001  . Thoracotomy Left     due to fungal infection  . Shoulder surgery Bilateral   . Cardiac catheterization    . Appendectomy    . Av fistula placement Left 01/21/2013    Procedure: ARTERIOVENOUS (AV) FISTULA CREATION, Left Brachiocephalic;  Surgeon: Angelia Mould, MD;  Location: [place];  Service: Vascular;  Laterality: Left;  . Cardiac catheterization  2008    L main irreg, LAD 80%, IMA-LAD & SVG-Diag patent, CFX 100%, SVG-OM 90%, RCA 70%, SVG-RCA OK, EF nl, med rx, no vessels appropriate for PCI  . Tee without cardioversion N/A 03/06/2014    Procedure: TRANSESOPHAGEAL ECHOCARDIOGRAM (TEE);  Surgeon: Larey Dresser, MD;  Location: [place];  Service: Cardiovascular;  Laterality: N/A;  . Cardioversion N/A 03/06/2014    Procedure: CARDIOVERSION;  Surgeon: Larey Dresser, MD;  Location[facility] ENDOSCOPY;  Service: Cardiovascular;  Laterality: N/A;  . Blepharoplasty Bilateral    Family History  Problem Relation Age of Onset  . Heart attack Father   . Heart disease Father     After 28 yrs of age  . Hyperlipidemia Father   . Hypertension Father   . Diabetes Mother   . Hypertension Mother   .  Heart disease Mother   . Heart disease Brother   . Diabetes Brother    Social History  Substance Use Topics  . Smoking status: Never Smoker   . Smokeless tobacco: Never Used  . Alcohol Use: No    Review of Systems  Ten systems are reviewed and are negative for acute change except as noted in the HPI  Allergies  Betadine; Iodinated diagnostic agents; Latex; and Tape  Home Medications   Prior to Admission medications   Medication Sig Start Date End Date Taking? Authorizing Provider  acetaminophen (TYLENOL) 325 MG tablet Take 2 tablets (650 mg total) by mouth every 4 (four) hours as needed for headache or mild pain. 09/29/15  Yes Amy D Clegg, NP  atorvastatin (LIPITOR) 80 MG tablet TAKE ONE TABLET BY MOUTH ONCE DAILY 02/26/15  Yes  Thayer Headings, MD  cholecalciferol (VITAMIN D) 1000 UNITS tablet Take 1,000 Units by mouth daily at 6 PM. D3   Yes Historical Provider, MD  insulin aspart (NOVOLOG FLEXPEN) 100 UNIT/ML FlexPen Inject 8 Units into the skin 2 (two) times daily before a meal.   Yes Historical Provider, MD  Insulin Glargine (LANTUS SOLOSTAR) 100 UNIT/ML SOPN Inject 24 Units into the skin every morning.    Yes Historical Provider, MD  isosorbide mononitrate (IMDUR) 30 MG 24 hr tablet Take 15 mg by mouth daily.    Yes Historical Provider, MD  levothyroxine (SYNTHROID, LEVOTHROID) 175 MCG tablet Take 175 mcg by mouth daily before breakfast.   Yes Historical Provider, MD  memantine (NAMENDA) 5 MG tablet Take 5 mg by mouth daily.  03/12/15  Yes Historical Provider, MD  Multiple Vitamins-Minerals (DECUBI-VITE) CAPS Take 1 capsule by mouth daily.   Yes Historical Provider, MD  OXYGEN Inhale 4 L/min into the lungs continuous. To keep O2 sats >90   Yes Historical Provider, MD  ranitidine (ZANTAC) 300 MG tablet Take 300 mg by mouth daily. 12/04/15  Yes Historical Provider, MD  SANTYL ointment Apply topically to affected foot 12/30/15  Yes Historical Provider, MD  sevelamer carbonate (RENVELA) 2.4 g PACK Take 2.4 g by mouth 3 (three) times daily. 09/29/15  Yes Amy D Ninfa Meeker, NP  warfarin (COUMADIN) 5 MG tablet TAKE AS DIRECTED Patient taking differently: Take 5 mg by mouth in the evening on Sun/Mon/Tues/Wed/Thurs/Fri and 2.5 mg on Sat 12/31/15  Yes Thayer Headings, MD  albuterol (PROVENTIL) (2.5 MG/3ML) 0.083% nebulizer solution Take 3 mLs (2.5 mg total) by nebulization every 6 (six) hours as needed for wheezing or shortness of breath. Patient not taking: Reported on 01/06/2016 09/29/15   Amy D Clegg, NP  diphenhydrAMINE (BENADRYL) 25 mg capsule Take 50 mg by mouth See admin instructions. To be taken as a one-time dose for procedure on 01/13/16    Historical Provider, MD  guaifenesin (ROBITUSSIN) 100 MG/5ML syrup Take 10 mLs (200 mg total)  by mouth every 6 (six) hours as needed for cough or congestion. Patient not taking: Reported on 01/06/2016 09/29/15   Amy D Clegg, NP  pantoprazole (PROTONIX) 40 MG tablet Take 1 tablet (40 mg total) by mouth daily at 6 (six) AM. Patient not taking: Reported on 01/10/2016 09/29/15   Amy D Clegg, NP  predniSONE (DELTASONE) 20 MG tablet Take 40 mg by mouth. 40 mg at 1900 on 01/12/16 then 40 mg at 2300 then 40 mg morning of 01/13/16 01/07/16   Historical Provider, MD   BP 167/58 mmHg  Pulse 55  Temp(Src) 97.7 F (36.5 C) (Oral)  Resp 15  Wt 77.3 kg  SpO2 100% Physical Exam  Constitutional: He is oriented to person, place, and time. He appears well-developed and well-nourished. No distress.  HENT:  Head: Normocephalic and atraumatic.  Eyes: Conjunctivae are normal. Pupils are equal, round, and reactive to light. Right eye exhibits no discharge. Left eye exhibits no discharge. No scleral icterus.  Neck: No tracheal deviation present.  Cardiovascular: Normal rate, regular rhythm and intact distal pulses.  Exam reveals no gallop and no friction rub.   Murmur heard. Holosystolic murmur  Pulmonary/Chest: Effort normal and breath sounds normal. No respiratory distress. He has no wheezes. He has no rales. He exhibits no tenderness.  Abdominal: Soft. Bowel sounds are normal. He exhibits no distension and no mass. There is no tenderness. There is no rebound and no guarding.  Musculoskeletal: Normal range of motion. He exhibits no edema.  Lymphadenopathy:    He has no cervical adenopathy.  Neurological: He is alert and oriented to person, place, and time. Coordination normal.  Cranial nerves II through XII grossly intact.  Skin: Skin is warm and dry. No rash noted. He is not diaphoretic. No erythema.  Psychiatric: He has a normal mood and affect. His behavior is normal.  Nursing note and vitals reviewed.   ED Course  Procedures (including critical care time) Labs Review Labs Reviewed  BASIC  METABOLIC PANEL - Abnormal; Notable for the following:    Sodium 133 (*)    Chloride 95 (*)    Glucose, Bld 174 (*)    BUN 27 (*)    Creatinine, Ser 3.18 (*)    Calcium 8.0 (*)    GFR calc non Af Amer 17 (*)    GFR calc Af Amer 20 (*)    All other components within normal limits  CBC - Abnormal; Notable for the following:    RBC 3.86 (*)    Hemoglobin 11.6 (*)    HCT 37.0 (*)    RDW 15.8 (*)    Platelets 127 (*)    All other components within normal limits  I-STAT TROPOININ, ED  I-STAT TROPOININ, ED    Imaging Review Dg Chest 2 View  01/10/2016  CLINICAL DATA:  82 year old male with history of left-sided chest pain today while at work, with some shortness of breath and radiation of the pain down into the left arm. EXAM: CHEST  2 VIEW COMPARISON:  Chest x-ray 09/27/2015. FINDINGS: Lung volumes are slightly low. No acute consolidative airspace disease. No pleural effusions. Mild interstitial prominence appears to be chronic. No evidence of pulmonary edema. Heart size is normal. Upper mediastinal contours are within normal limits. Aortic atherosclerosis. Status post median sternotomy for CABG. IMPRESSION: 1. No radiographic evidence of acute cardiopulmonary disease. 2. Aortic atherosclerosis. Electronically Signed   By: Vinnie Langton M.D.   On: 01/10/2016 17:02   I have personally reviewed and evaluated these images and lab results as part of my medical decision-making.   EKG Interpretation   Date/Time:  Monday January 10 2016 15:36:26 EDT Ventricular Rate:  63 PR Interval:    QRS Duration: 121 QT Interval:  444 QTC Calculation: 455 R Axis:   67 Text Interpretation:  Sinus rhythm LVH with secondary repolarization  abnormality Anterior Q waves, possibly due to LVH Confirmed by DELO  MD,  DOUGLAS ([id number]) on 01/10/2016 4:39:58 PM      MDM   Final diagnoses:  Chest pain, unspecified chest pain type   Patient nontoxic-appearing, vital signs stable.  Troponin 2,  BMP, CBC, BNP,  chest x-ray unremarkable for acute change. Doubt PE, dissection, AAA rupture.  Heart score 6. Patient will need admission for chest pain rule out.  Bastrop Lions, PA-C 01/10/16 2047  Varney Biles, MD 01/13/16 [phone]

## 2016-01-10 NOTE — ED Notes (Addendum)
Patient comes via EMS. Pt at dialysis, called EMS halfway during, having left side chest pain back radiation. Hx cardiac. 10/10 pain for EMS. Gave 4 baby aspirin, 1 nitro, BP 140/60 HR 60. cbg 167. 0/10 chest pain at arrival to ED. "EMS did stroke screen because patient was mumbling." 20 g R forearm IV in place. Patient alert and oriented x 4. BP 84/45.

## 2016-01-10 NOTE — H&P (Signed)
History and Physical    [name] [id number] DOB: 1934-06-11 DOA: 01/10/2016  PCP: Donnajean Lopes, MD  Patient coming from: Home.  History obtained from patient's wife. Patient has mild dementia.  Chief Complaint: Chest pain.  HPI: [name] is a 82 y.o. male with ESRD on hemodialysis since March 2017, CAD status post CABG, atrial fibrillation on Coumadin, hypertension and diastolic CHF with moderate aortic stenosis started developing chest pressure with shortness of breath while at dialysis. Patient was transferred to hospital and en route was given sublingual nitroglycerin following which patient's symptoms improved and resolved. EKG was showing nonspecific changes and troponin was negative. Patient is presently chest pain-free and admitted for further management. Patient did not have any fever chills or productive cough.  ED Course: Chest x-ray was unremarkable with EKG showing nonspecific findings. Sublingual nitroglycerin was given by EMS following which patient's symptoms resolved.  Review of Systems: As per HPI, rest all negative.   Past Medical History  Diagnosis Date  . Hypertension   . Hyperlipidemia   . Hypothyroidism   . CAD (coronary artery disease)     a. prior CABG in 1997 with redo in 2000. b. last cath in 2008 - managed medically  . Type 2 diabetes mellitus ([place])   . Generalized weakness     without overt findings  . Peripheral vascular disease ([place])   . Carotid disease, bilateral ([place])     with multiple bilateral carotid surgeries  . Psoriasis   . Meniere's disease   . Degenerative joint disease   . Gout   . Orthopnea     Two-pillow  . CKD (chronic kidney disease), stage IV ([place])     a. Has fistula in place.   . Chronic diastolic CHF (congestive heart failure) ([place])   . GERD (gastroesophageal reflux disease)   . Atrial fibrillation ([place]) Aug. 2015    a. Dx 01/2014, s/p DCCV 03/06/14.  . Sinus bradycardia     a. Toprol D/C'd due to this.    . Shingles   . On home oxygen therapy     "2L" qhs (09/09/2015  . Dementia   . Chronic respiratory failure with hypoxia [facility])     Past Surgical History  Procedure Laterality Date  . Coronary artery bypass graft  1997;  2002  . Carotid endarterectomy Bilateral "several times"    "5 on right; 2-3 on left" (09/09/2015)  . Lung removal, partial    . Lumbar laminectomy  July 2001  . Thoracotomy Left     due to fungal infection  . Shoulder surgery Bilateral   . Cardiac catheterization    . Appendectomy    . Av fistula placement Left 01/21/2013    Procedure: ARTERIOVENOUS (AV) FISTULA CREATION, Left Brachiocephalic;  Surgeon: Angelia Mould, MD;  Location: [place];  Service: Vascular;  Laterality: Left;  . Cardiac catheterization  2008    L main irreg, LAD 80%, IMA-LAD & SVG-Diag patent, CFX 100%, SVG-OM 90%, RCA 70%, SVG-RCA OK, EF nl, med rx, no vessels appropriate for PCI  . Tee without cardioversion N/A 03/06/2014    Procedure: TRANSESOPHAGEAL ECHOCARDIOGRAM (TEE);  Surgeon: Larey Dresser, MD;  Location: [place];  Service: Cardiovascular;  Laterality: N/A;  . Cardioversion N/A 03/06/2014    Procedure: CARDIOVERSION;  Surgeon: Larey Dresser, MD;  Location: [place];  Service: Cardiovascular;  Laterality: N/A;  . Blepharoplasty Bilateral      reports that he has never smoked. He has never  used smokeless tobacco. He reports that he does not drink alcohol or use illicit drugs.  Allergies  Allergen Reactions  . Betadine [Povidone Iodine] Rash  . Iodinated Diagnostic Agents Rash and Other (See Comments)    Does fine with premedications for cath  . Latex Hives  . Tape Rash    MUST BE FREE OF ANY LATEX    Family History  Problem Relation Age of Onset  . Heart attack Father   . Heart disease Father     After 56 yrs of age  . Hyperlipidemia Father   . Hypertension Father   . Diabetes Mother   . Hypertension Mother   . Heart disease Mother   . Heart disease  Brother   . Diabetes Brother     Prior to Admission medications   Medication Sig Start Date End Date Taking? Authorizing Provider  acetaminophen (TYLENOL) 325 MG tablet Take 2 tablets (650 mg total) by mouth every 4 (four) hours as needed for headache or mild pain. 09/29/15  Yes Amy D Clegg, NP  atorvastatin (LIPITOR) 80 MG tablet TAKE ONE TABLET BY MOUTH ONCE DAILY 02/26/15  Yes Thayer Headings, MD  cholecalciferol (VITAMIN D) 1000 UNITS tablet Take 1,000 Units by mouth daily at 6 PM. D3   Yes Historical Provider, MD  insulin aspart (NOVOLOG FLEXPEN) 100 UNIT/ML FlexPen Inject 8 Units into the skin 2 (two) times daily before a meal.   Yes Historical Provider, MD  Insulin Glargine (LANTUS SOLOSTAR) 100 UNIT/ML SOPN Inject 24 Units into the skin every morning.    Yes Historical Provider, MD  isosorbide mononitrate (IMDUR) 30 MG 24 hr tablet Take 15 mg by mouth daily.    Yes Historical Provider, MD  levothyroxine (SYNTHROID, LEVOTHROID) 175 MCG tablet Take 175 mcg by mouth daily before breakfast.   Yes Historical Provider, MD  memantine (NAMENDA) 5 MG tablet Take 5 mg by mouth daily.  03/12/15  Yes Historical Provider, MD  Multiple Vitamins-Minerals (DECUBI-VITE) CAPS Take 1 capsule by mouth daily.   Yes Historical Provider, MD  OXYGEN Inhale 4 L/min into the lungs continuous. To keep O2 sats >90   Yes Historical Provider, MD  ranitidine (ZANTAC) 300 MG tablet Take 300 mg by mouth daily. 12/04/15  Yes Historical Provider, MD  SANTYL ointment Apply topically to affected foot 12/30/15  Yes Historical Provider, MD  sevelamer carbonate (RENVELA) 2.4 g PACK Take 2.4 g by mouth 3 (three) times daily. 09/29/15  Yes Amy D Ninfa Meeker, NP  warfarin (COUMADIN) 5 MG tablet TAKE AS DIRECTED Patient taking differently: Take 5 mg by mouth in the evening on Sun/Mon/Tues/Wed/Thurs/Fri and 2.5 mg on Sat 12/31/15  Yes Thayer Headings, MD  albuterol (PROVENTIL) (2.5 MG/3ML) 0.083% nebulizer solution Take 3 mLs (2.5 mg total) by  nebulization every 6 (six) hours as needed for wheezing or shortness of breath. Patient not taking: Reported on 01/06/2016 09/29/15   Amy D Clegg, NP  diphenhydrAMINE (BENADRYL) 25 mg capsule Take 50 mg by mouth See admin instructions. To be taken as a one-time dose for procedure on 01/13/16    Historical Provider, MD  guaifenesin (ROBITUSSIN) 100 MG/5ML syrup Take 10 mLs (200 mg total) by mouth every 6 (six) hours as needed for cough or congestion. Patient not taking: Reported on 01/06/2016 09/29/15   Amy D Clegg, NP  pantoprazole (PROTONIX) 40 MG tablet Take 1 tablet (40 mg total) by mouth daily at 6 (six) AM. Patient not taking: Reported on 01/10/2016 09/29/15  Amy D Clegg, NP  predniSONE (DELTASONE) 20 MG tablet Take 40 mg by mouth. 40 mg at 1900 on 01/12/16 then 40 mg at 2300 then 40 mg morning of 01/13/16 01/07/16   Historical Provider, MD    Physical Exam: Filed Vitals:   01/10/16 2100 01/10/16 2115 01/10/16 2121 01/10/16 2157  BP: 134/46 150/53  165/52  Pulse: 57 60  61  Temp:   98.2 F (36.8 C) 98.4 F (36.9 C)  TempSrc:   Oral Oral  Resp: 20 17  18   Weight:      SpO2: 98% 98%  98%      Constitutional: Not in distress. Filed Vitals:   01/10/16 2100 01/10/16 2115 01/10/16 2121 01/10/16 2157  BP: 134/46 150/53  165/52  Pulse: 57 60  61  Temp:   98.2 F (36.8 C) 98.4 F (36.9 C)  TempSrc:   Oral Oral  Resp: 20 17  18   Weight:      SpO2: 98% 98%  98%   Eyes: Anicteric no pallor. ENMT: No discharge from the ears eyes nose or mouth. Neck: No JVD appreciated no mass felt. Respiratory: No rhonchi or crepitations. Cardiovascular: S1-S2 heard. Abdomen: Soft nontender bowel sounds present. Musculoskeletal: Left heel ulcer. Skin: Left heel ulcer which is chronic. Neurologic: Alert awake oriented to time place and person but has some difficulty with memories. Moves all extremities. Psychiatric: Has some mild dementia.   Labs on Admission: I have personally reviewed following  labs and imaging studies  CBC:  Recent Labs Lab 01/10/16 1545  WBC 5.2  HGB 11.6*  HCT 37.0*  MCV 95.9  PLT [id number]*   Basic Metabolic Panel:  Recent Labs Lab 01/10/16 1545  NA 133*  K 4.1  CL 95*  CO2 32  GLUCOSE 174*  BUN 27*  CREATININE 3.18*  CALCIUM 8.0*   GFR: Estimated Creatinine Clearance: 19.4 mL/min (by C-G formula based on Cr of 3.18). Liver Function Tests: No results for input(s): AST, ALT, ALKPHOS, BILITOT, PROT, ALBUMIN in the last 168 hours. No results for input(s): LIPASE, AMYLASE in the last 168 hours. No results for input(s): AMMONIA in the last 168 hours. Coagulation Profile:  Recent Labs Lab 01/06/16  INR 1.7   Cardiac Enzymes: No results for input(s): CKTOTAL, CKMB, CKMBINDEX, TROPONINI in the last 168 hours. BNP (last 3 results) No results for input(s): PROBNP in the last 8760 hours. HbA1C: No results for input(s): HGBA1C in the last 72 hours. CBG: No results for input(s): GLUCAP in the last 168 hours. Lipid Profile: No results for input(s): CHOL, HDL, LDLCALC, TRIG, CHOLHDL, LDLDIRECT in the last 72 hours. Thyroid Function Tests: No results for input(s): TSH, T4TOTAL, FREET4, T3FREE, THYROIDAB in the last 72 hours. Anemia Panel: No results for input(s): VITAMINB12, FOLATE, FERRITIN, TIBC, IRON, RETICCTPCT in the last 72 hours. Urine analysis:    Component Value Date/Time   COLORURINE YELLOW 09/17/2015 1815   APPEARANCEUR CLOUDY* 09/17/2015 1815   LABSPEC 1.010 09/17/2015 1815   PHURINE 5.0 09/17/2015 1815   GLUCOSEU NEGATIVE 09/17/2015 1815   HGBUR SMALL* 09/17/2015 1815   BILIRUBINUR NEGATIVE 09/17/2015 1815   KETONESUR NEGATIVE 09/17/2015 1815   PROTEINUR NEGATIVE 09/17/2015 1815   UROBILINOGEN 1.0 03/23/2014 2156   NITRITE NEGATIVE 09/17/2015 1815   LEUKOCYTESUR LARGE* 09/17/2015 1815   Sepsis Labs: @LABRCNTIP (procalcitonin:4,lacticidven:4) )No results found for this or any previous visit (from the past 240 hour(s)).    Radiological Exams on Admission: Dg Chest 2 View  01/10/2016  CLINICAL DATA:  82 year old male with history of left-sided chest pain today while at work, with some shortness of breath and radiation of the pain down into the left arm. EXAM: CHEST  2 VIEW COMPARISON:  Chest x-ray 09/27/2015. FINDINGS: Lung volumes are slightly low. No acute consolidative airspace disease. No pleural effusions. Mild interstitial prominence appears to be chronic. No evidence of pulmonary edema. Heart size is normal. Upper mediastinal contours are within normal limits. Aortic atherosclerosis. Status post median sternotomy for CABG. IMPRESSION: 1. No radiographic evidence of acute cardiopulmonary disease. 2. Aortic atherosclerosis. Electronically Signed   By: Vinnie Langton M.D.   On: 01/10/2016 17:02    EKG: Independently reviewed. Normal sinus rhythm with secondary repolarization changes with LVH.  Assessment/Plan Principal Problem:   Chest pain Active Problems:   Diabetes mellitus with peripheral vascular disease (HCC)   CAD (coronary artery disease)   Atrial fibrillation (HCC)   Chronic combined systolic and diastolic CHF (congestive heart failure) ([place])   ESRD on dialysis ([place])    1. Chest pain concerning for angina - patient is on Coumadin which will be held and placed on heparin if INR is less than 2 in anticipation of cardiac procedures. Nitroglycerin when necessary. Continue statins Imdur. Patient is not on beta blocker secondary to bradycardia. Cycle cardiac markers. Check 2-D echo and consult cardiology in a.m. We'll also check d-dimer. 2. Atrial fibrillation with chads 2 vasc score more than 2. Patient is on anticoagulation. Coumadin will be held in anticipation of procedure and place patient on heparin once INR is less than 2. Not on beta blocker secondary to bradycardia. 3. ESRD on hemodialysis since March 2017 - has had dialysis yesterday. Consult nephrology for dialysis. 4. Chronic diastolic CHF  and moderate aortic stenosis - I have ordered repeat 2-D echo. Check aortic stenosis. Fluid management per nephrology. 5. Diabetes mellitus type 2 - since patient is nothing by mouth in a.m. I have decreased patient's Lantus dose from 24 units to 5 units with sliding scale coverage. Closely follow CBGs. 6. Chronic anemia probably from ESRD - follow CBC. 7. Dementia on Namenda. 8. Hypothyroidism on Synthroid. 9. Chronic left heel ulcer.   DVT prophylaxis: Heparin/Coumadin. Code Status: DO NOT RESUSCITATE.  Family Communication: Patient's wife and son.  Disposition Plan: Home.  Consults called: None.  Admission status: Observation. Telemetry.    Rise Patience MD [facility] Pager [phone].  If 7PM-7AM, please contact night-coverage [email or web address] Password [facility]  01/10/2016, 10:18 PM

## 2016-01-10 NOTE — ED Notes (Signed)
Attempted to call report. Floor RN unable to accept report.  

## 2016-01-10 NOTE — Progress Notes (Signed)
ANTICOAGULATION CONSULT NOTE - Initial Consult  Pharmacy Consult for heparin Indication: atrial fibrillation  Allergies  Allergen Reactions  . Betadine [Povidone Iodine] Rash  . Iodinated Diagnostic Agents Rash and Other (See Comments)    Does fine with premedications for cath  . Latex Hives  . Tape Rash    MUST BE FREE OF ANY LATEX    Patient Measurements: Height: 5\' 10"  (177.8 cm) Weight: 170 lb 6.7 oz (77.3 kg) IBW/kg (Calculated) : 73  Vital Signs: Temp: 98.4 F (36.9 C) (07/17 2157) Temp Source: Oral (07/17 2157) BP: 165/52 mmHg (07/17 2157) Pulse Rate: 61 (07/17 2157)  Labs:  Recent Labs  01/10/16 1545 01/10/16 2229  HGB 11.6*  --   HCT 37.0*  --   PLT 127*  --   LABPROT  --  24.8*  INR  --  2.27*  CREATININE 3.18*  --   TROPONINI  --  0.05*    Estimated Creatinine Clearance: 19.4 mL/min (by C-G formula based on Cr of 3.18).   Medical History: Past Medical History  Diagnosis Date  . Hypertension   . Hyperlipidemia   . Hypothyroidism   . CAD (coronary artery disease)     a. prior CABG in 1997 with redo in 2000. b. last cath in 2008 - managed medically  . Type 2 diabetes mellitus ([place])   . Generalized weakness     without overt findings  . Peripheral vascular disease ([place])   . Carotid disease, bilateral ([place])     with multiple bilateral carotid surgeries  . Psoriasis   . Meniere's disease   . Degenerative joint disease   . Gout   . Orthopnea     Two-pillow  . CKD (chronic kidney disease), stage IV ([place])     a. Has fistula in place.   . Chronic diastolic CHF (congestive heart failure) ([place])   . GERD (gastroesophageal reflux disease)   . Atrial fibrillation ([place]) Aug. 2015    a. Dx 01/2014, s/p DCCV 03/06/14.  . Sinus bradycardia     a. Toprol D/C'd due to this.   . Shingles   . On home oxygen therapy     "2L" qhs (09/09/2015  . Dementia   . Chronic respiratory failure with hypoxia ([facility])     Medications:  Prescriptions prior to admission   Medication Sig Dispense Refill Last Dose  . acetaminophen (TYLENOL) 325 MG tablet Take 2 tablets (650 mg total) by mouth every 4 (four) hours as needed for headache or mild pain.   Taking  . atorvastatin (LIPITOR) 80 MG tablet TAKE ONE TABLET BY MOUTH ONCE DAILY 90 tablet 3 01/09/2016 at pm  . cholecalciferol (VITAMIN D) 1000 UNITS tablet Take 1,000 Units by mouth daily at 6 PM. D3   01/09/2016 at pm  . insulin aspart (NOVOLOG FLEXPEN) 100 UNIT/ML FlexPen Inject 8 Units into the skin 2 (two) times daily before a meal.   01/10/2016 at am  . Insulin Glargine (LANTUS SOLOSTAR) 100 UNIT/ML SOPN Inject 24 Units into the skin every morning.    01/10/2016 at am  . isosorbide mononitrate (IMDUR) 30 MG 24 hr tablet Take 15 mg by mouth daily.    01/10/2016 at am  . levothyroxine (SYNTHROID, LEVOTHROID) 175 MCG tablet Take 175 mcg by mouth daily before breakfast.   01/10/2016 at am  . memantine (NAMENDA) 5 MG tablet Take 5 mg by mouth daily.    01/10/2016 at am  . Multiple Vitamins-Minerals (DECUBI-VITE) CAPS Take 1 capsule by  mouth daily.   01/09/2016 at pm  . OXYGEN Inhale 4 L/min into the lungs continuous. To keep O2 sats >90   Taking  . ranitidine (ZANTAC) 300 MG tablet Take 300 mg by mouth daily.   01/10/2016 at am  . SANTYL ointment Apply topically to affected foot   Past Week at Unknown time  . sevelamer carbonate (RENVELA) 2.4 g PACK Take 2.4 g by mouth 3 (three) times daily. 90 each  01/10/2016 at am  . warfarin (COUMADIN) 5 MG tablet TAKE AS DIRECTED (Patient taking differently: Take 5 mg by mouth in the evening on Sun/Mon/Tues/Wed/Thurs/Fri and 2.5 mg on Sat) 90 tablet 0 01/09/2016 at pm  . albuterol (PROVENTIL) (2.5 MG/3ML) 0.083% nebulizer solution Take 3 mLs (2.5 mg total) by nebulization every 6 (six) hours as needed for wheezing or shortness of breath. (Patient not taking: Reported on 01/06/2016) 75 mL 12 Not Taking at Unknown time  . diphenhydrAMINE (BENADRYL) 25 mg capsule Take 50 mg by mouth See admin  instructions. To be taken as a one-time dose for procedure on 01/13/16     . guaifenesin (ROBITUSSIN) 100 MG/5ML syrup Take 10 mLs (200 mg total) by mouth every 6 (six) hours as needed for cough or congestion. (Patient not taking: Reported on 01/06/2016) 120 mL 0 Not Taking at Unknown time  . pantoprazole (PROTONIX) 40 MG tablet Take 1 tablet (40 mg total) by mouth daily at 6 (six) AM. (Patient not taking: Reported on 01/10/2016)   Not Taking at Unknown time  . predniSONE (DELTASONE) 20 MG tablet Take 40 mg by mouth. 40 mg at 1900 on 01/12/16 then 40 mg at 2300 then 40 mg morning of 01/13/16      Scheduled:  . atorvastatin  80 mg Oral Daily  . famotidine  20 mg Oral BID  . [START ON 01/11/2016] insulin aspart  0-9 Units Subcutaneous TID WC  . [START ON 01/11/2016] insulin glargine  5 Units Subcutaneous Daily  . [START ON 01/11/2016] isosorbide mononitrate  15 mg Oral Daily  . [START ON 01/11/2016] levothyroxine  175 mcg Oral QAC breakfast  . [START ON 01/11/2016] memantine  5 mg Oral Daily  . [START ON 01/11/2016] multivitamin with minerals  1 tablet Oral Daily  . [START ON 01/11/2016] sevelamer carbonate  2.4 g Oral TID WC    Assessment: 82yo male presents w/ CP during HD, relieved in ED w/ NTGx1, to transition from Coumadin to heparin for Afib in case intervention required; current INR >2.  Goal of Therapy:  Heparin level 0.3-0.7 units/ml Monitor platelets by anticoagulation protocol: Yes   Plan:  Will begin heparin when INR <2 and f/u resume Coumadin.  Wynona Neat, PharmD, BCPS  01/10/2016,11:44 PM

## 2016-01-11 ENCOUNTER — Encounter (HOSPITAL_COMMUNITY): Payer: Self-pay | Admitting: Cardiology

## 2016-01-11 ENCOUNTER — Other Ambulatory Visit (HOSPITAL_COMMUNITY): Payer: Medicare Other

## 2016-01-11 DIAGNOSIS — I25710 Atherosclerosis of autologous vein coronary artery bypass graft(s) with unstable angina pectoris: Secondary | ICD-10-CM | POA: Diagnosis present

## 2016-01-11 DIAGNOSIS — R079 Chest pain, unspecified: Secondary | ICD-10-CM

## 2016-01-11 DIAGNOSIS — L97429 Non-pressure chronic ulcer of left heel and midfoot with unspecified severity: Secondary | ICD-10-CM | POA: Diagnosis present

## 2016-01-11 DIAGNOSIS — I480 Paroxysmal atrial fibrillation: Secondary | ICD-10-CM

## 2016-01-11 DIAGNOSIS — I120 Hypertensive chronic kidney disease with stage 5 chronic kidney disease or end stage renal disease: Secondary | ICD-10-CM | POA: Diagnosis not present

## 2016-01-11 DIAGNOSIS — I2582 Chronic total occlusion of coronary artery: Secondary | ICD-10-CM | POA: Diagnosis present

## 2016-01-11 DIAGNOSIS — I25118 Atherosclerotic heart disease of native coronary artery with other forms of angina pectoris: Secondary | ICD-10-CM | POA: Diagnosis not present

## 2016-01-11 DIAGNOSIS — F0390 Unspecified dementia without behavioral disturbance: Secondary | ICD-10-CM | POA: Diagnosis present

## 2016-01-11 DIAGNOSIS — I25110 Atherosclerotic heart disease of native coronary artery with unstable angina pectoris: Secondary | ICD-10-CM | POA: Diagnosis present

## 2016-01-11 DIAGNOSIS — E1122 Type 2 diabetes mellitus with diabetic chronic kidney disease: Secondary | ICD-10-CM | POA: Diagnosis present

## 2016-01-11 DIAGNOSIS — I25708 Atherosclerosis of coronary artery bypass graft(s), unspecified, with other forms of angina pectoris: Secondary | ICD-10-CM | POA: Diagnosis not present

## 2016-01-11 DIAGNOSIS — I2510 Atherosclerotic heart disease of native coronary artery without angina pectoris: Secondary | ICD-10-CM | POA: Diagnosis not present

## 2016-01-11 DIAGNOSIS — I350 Nonrheumatic aortic (valve) stenosis: Secondary | ICD-10-CM | POA: Diagnosis present

## 2016-01-11 DIAGNOSIS — Z951 Presence of aortocoronary bypass graft: Secondary | ICD-10-CM | POA: Diagnosis not present

## 2016-01-11 DIAGNOSIS — Z9981 Dependence on supplemental oxygen: Secondary | ICD-10-CM | POA: Diagnosis not present

## 2016-01-11 DIAGNOSIS — E1129 Type 2 diabetes mellitus with other diabetic kidney complication: Secondary | ICD-10-CM | POA: Diagnosis not present

## 2016-01-11 DIAGNOSIS — I5042 Chronic combined systolic (congestive) and diastolic (congestive) heart failure: Secondary | ICD-10-CM | POA: Diagnosis present

## 2016-01-11 DIAGNOSIS — N186 End stage renal disease: Secondary | ICD-10-CM | POA: Diagnosis present

## 2016-01-11 DIAGNOSIS — E785 Hyperlipidemia, unspecified: Secondary | ICD-10-CM | POA: Diagnosis present

## 2016-01-11 DIAGNOSIS — Z794 Long term (current) use of insulin: Secondary | ICD-10-CM | POA: Diagnosis not present

## 2016-01-11 DIAGNOSIS — D631 Anemia in chronic kidney disease: Secondary | ICD-10-CM | POA: Diagnosis present

## 2016-01-11 DIAGNOSIS — K219 Gastro-esophageal reflux disease without esophagitis: Secondary | ICD-10-CM | POA: Diagnosis present

## 2016-01-11 DIAGNOSIS — E1151 Type 2 diabetes mellitus with diabetic peripheral angiopathy without gangrene: Secondary | ICD-10-CM | POA: Diagnosis present

## 2016-01-11 DIAGNOSIS — Z7901 Long term (current) use of anticoagulants: Secondary | ICD-10-CM | POA: Diagnosis not present

## 2016-01-11 DIAGNOSIS — L8990 Pressure ulcer of unspecified site, unspecified stage: Secondary | ICD-10-CM | POA: Insufficient documentation

## 2016-01-11 DIAGNOSIS — I132 Hypertensive heart and chronic kidney disease with heart failure and with stage 5 chronic kidney disease, or end stage renal disease: Secondary | ICD-10-CM | POA: Diagnosis present

## 2016-01-11 DIAGNOSIS — J9611 Chronic respiratory failure with hypoxia: Secondary | ICD-10-CM | POA: Diagnosis present

## 2016-01-11 DIAGNOSIS — E8889 Other specified metabolic disorders: Secondary | ICD-10-CM | POA: Diagnosis present

## 2016-01-11 DIAGNOSIS — E039 Hypothyroidism, unspecified: Secondary | ICD-10-CM | POA: Diagnosis present

## 2016-01-11 DIAGNOSIS — Z992 Dependence on renal dialysis: Secondary | ICD-10-CM | POA: Diagnosis not present

## 2016-01-11 DIAGNOSIS — Z66 Do not resuscitate: Secondary | ICD-10-CM | POA: Diagnosis present

## 2016-01-11 LAB — PROTIME-INR
INR: 2.44 — ABNORMAL HIGH (ref 0.00–1.49)
Prothrombin Time: 26.2 seconds — ABNORMAL HIGH (ref 11.6–15.2)

## 2016-01-11 LAB — GLUCOSE, CAPILLARY
GLUCOSE-CAPILLARY: 92 mg/dL (ref 65–99)
GLUCOSE-CAPILLARY: 224 mg/dL — AB (ref 65–99)
Glucose-Capillary: 88 mg/dL (ref 65–99)
Glucose-Capillary: 241 mg/dL — ABNORMAL HIGH (ref 65–99)

## 2016-01-11 LAB — TROPONIN I
Troponin I: 0.05 ng/mL (ref <0.03)
Troponin I: 0.05 ng/mL (ref <0.03)

## 2016-01-11 MED ORDER — HYDRALAZINE HCL 20 MG/ML IJ SOLN
10.0000 mg | Freq: Once | INTRAMUSCULAR | Status: AC
  Administered 2016-01-11: 10 mg via INTRAVENOUS
  Filled 2016-01-11: qty 1

## 2016-01-11 MED ORDER — ASPIRIN 81 MG PO CHEW
81.0000 mg | CHEWABLE_TABLET | ORAL | Status: AC
  Administered 2016-01-12: 81 mg via ORAL
  Filled 2016-01-11: qty 1

## 2016-01-11 MED ORDER — DIPHENHYDRAMINE HCL 50 MG/ML IJ SOLN
25.0000 mg | INTRAMUSCULAR | Status: AC
  Administered 2016-01-12: 25 mg via INTRAVENOUS
  Filled 2016-01-11: qty 1

## 2016-01-11 MED ORDER — SODIUM CHLORIDE 0.9% FLUSH
3.0000 mL | Freq: Two times a day (BID) | INTRAVENOUS | Status: DC
  Administered 2016-01-11: 3 mL via INTRAVENOUS

## 2016-01-11 MED ORDER — SODIUM CHLORIDE 0.9 % IV SOLN
INTRAVENOUS | Status: DC
Start: 2016-01-12 — End: 2016-01-12
  Administered 2016-01-12: 06:00:00 via INTRAVENOUS

## 2016-01-11 MED ORDER — FAMOTIDINE IN NACL 20-0.9 MG/50ML-% IV SOLN
20.0000 mg | INTRAVENOUS | Status: AC
  Administered 2016-01-12: 20 mg via INTRAVENOUS
  Filled 2016-01-11: qty 50

## 2016-01-11 MED ORDER — ASPIRIN 325 MG PO TABS
325.0000 mg | ORAL_TABLET | Freq: Every day | ORAL | Status: DC
  Administered 2016-01-11: 325 mg via ORAL
  Filled 2016-01-11: qty 1

## 2016-01-11 MED ORDER — SODIUM CHLORIDE 0.9% FLUSH: 3.0000 mL | INTRAVENOUS | Status: DC | PRN

## 2016-01-11 MED ORDER — PREDNISONE 20 MG PO TABS
60.0000 mg | ORAL_TABLET | ORAL | Status: AC
  Administered 2016-01-12: 60 mg via ORAL
  Filled 2016-01-11: qty 3

## 2016-01-11 MED ORDER — ASPIRIN 325 MG PO TABS
325.0000 mg | ORAL_TABLET | Freq: Every day | ORAL | Status: DC
  Administered 2016-01-13: 325 mg via ORAL
  Filled 2016-01-11: qty 1

## 2016-01-11 MED ORDER — SODIUM CHLORIDE 0.9 % IV SOLN: 250.0000 mL | INTRAVENOUS | Status: DC | PRN

## 2016-01-11 MED ORDER — PREDNISONE 20 MG PO TABS
60.0000 mg | ORAL_TABLET | ORAL | Status: AC
  Administered 2016-01-11: 60 mg via ORAL
  Filled 2016-01-11: qty 3

## 2016-01-11 MED ORDER — PHYTONADIONE 5 MG PO TABS
2.5000 mg | ORAL_TABLET | Freq: Once | ORAL | Status: AC
  Administered 2016-01-11: 2.5 mg via ORAL
  Filled 2016-01-11: qty 1

## 2016-01-11 NOTE — Care Management Obs Status (Signed)
[facility] NOTIFICATION   Patient Details  Name: [name] MRN: [id number] Date of Birth: [birth year]   Medicare Observation Status Notification Given:  Yes    Bethena Roys, RN 01/11/2016, 11:08 AM

## 2016-01-11 NOTE — Progress Notes (Signed)
PROGRESS NOTE    [name]  [id number] DOB: [birth year] DOA: 01/10/2016 PCP: Donnajean Lopes, MD   Brief Narrative:  [name] is a 82 y.o. male with ESRD on hemodialysis since March 2017, CAD status post CABG, atrial fibrillation on Coumadin, hypertension and diastolic CHF with moderate aortic stenosis started developing chest pressure with shortness of breath while at dialysis. Patient was transferred to hospital and en route was given sublingual nitroglycerin following which patient's symptoms improved and resolved. EKG was showing nonspecific changes and troponin was negative. Patient is presently chest pain-free and admitted for further management. Patient did not have any fever chills or productive cough.  Assessment & Plan   Chest pain -Concerning for angina -Troponin 0.05 -Cardiology consulted and appreciated. Possible catheterization pending INR. Patient given vitamin K. -Echocardiogram pending  Atrial fibrillation -CHADSVASC 5 (age, DM, CHF, vascular) -Currently on Coumadin, INR -Will start heparin once INR is less than 2  ESRD -Patient dialyzes Monday, Wednesday, Friday. -Recently started dialyzing March 2017 -Patient did not complete his Wednesday dialysis session -Nephrology consulted and appreciated  Chronic diastolic heart failure -Echocardiogram pending -Fluid management with HD  Diabetes mellitus, type II -Continue Lantus, and some sliding scale, CBG monitoring  Chronic anemia -Hemoglobin currently 11.6, continue to monitor CBC  Dementia -On Namenda  Hypothyroidism -Continue Synthroid  Chronic left heel ulcer -Wound care Consulted  DVT Prophylaxis  Coumadin (Heparin when INR <2)  Code Status: DNR  Family Communication: Wife at bedside  Disposition Plan: Admitted  Consultants Cardiology Nephrology   Procedures  None  Antibiotics   Anti-infectives    None      Subjective:   [name] seen and examined today.   Continues to have some chest pain, but feels it is better. Cannot really characterize it.  Denies shortness of breath, dizziness, headache, abdominal pain, nausea or vomiting.  Objective:   Filed Vitals:   01/10/16 2356 01/11/16 0311 01/11/16 0800 01/11/16 0801  BP: 169/49 119/85 169/57   Pulse: 56 63 58   Temp: 98.1 F (36.7 C) 98.3 F (36.8 C)  98.1 F (36.7 C)  TempSrc: Oral Oral  Oral  Resp: 18 18    Height:      Weight:  78.064 kg (172 lb 1.6 oz)    SpO2: 96% 99% 100%     Intake/Output Summary (Last 24 hours) at 01/11/16 1303 Last data filed at 01/11/16 0800  Gross per 24 hour  Intake      0 ml  Output    175 ml  Net   -175 ml   Filed Weights   01/10/16 1537 01/10/16 2157 01/11/16 0311  Weight: 77.3 kg (170 lb 6.7 oz) 77.3 kg (170 lb 6.7 oz) 78.064 kg (172 lb 1.6 oz)    Exam  General: Well developed, well nourished, NAD, appears stated age  35: NCAT,mucous membranes moist.   Neck: +bruit, supple, no JVD  Cardiovascular: S1 S2 auscultated, 3/6 SEM, Regular rate and rhythm.  Respiratory: Clear to auscultation bilaterally with equal chest rise  Abdomen: Soft, nontender, nondistended, + bowel sounds  Extremities: warm dry without cyanosis clubbing or edema  Neuro: AAOx3,nonfocal  Psych: Appropriate mood and affect   Data Reviewed: I have personally reviewed following labs and imaging studies  CBC:  Recent Labs Lab 01/10/16 1545  WBC 5.2  HGB 11.6*  HCT 37.0*  MCV 95.9  PLT [id number]*   Basic Metabolic Panel:  Recent Labs Lab 01/10/16 1545  NA 133*  K 4.1  CL 95*  CO2 32  GLUCOSE 174*  BUN 27*  CREATININE 3.18*  CALCIUM 8.0*   GFR: Estimated Creatinine Clearance: 19.4 mL/min (by C-G formula based on Cr of 3.18). Liver Function Tests: No results for input(s): AST, ALT, ALKPHOS, BILITOT, PROT, ALBUMIN in the last 168 hours. No results for input(s): LIPASE, AMYLASE in the last 168 hours. No results for input(s): AMMONIA in the last 168  hours. Coagulation Profile:  Recent Labs Lab 01/06/16 01/10/16 2229 01/11/16 0414  INR 1.7 2.27* 2.44*   Cardiac Enzymes:  Recent Labs Lab 01/10/16 2229 01/11/16 0414 01/11/16 1113  TROPONINI 0.05* 0.05* 0.05*   BNP (last 3 results) No results for input(s): PROBNP in the last 8760 hours. HbA1C: No results for input(s): HGBA1C in the last 72 hours. CBG:  Recent Labs Lab 01/10/16 2355 01/11/16 0756 01/11/16 1116  GLUCAP 167* 88 92   Lipid Profile: No results for input(s): CHOL, HDL, LDLCALC, TRIG, CHOLHDL, LDLDIRECT in the last 72 hours. Thyroid Function Tests: No results for input(s): TSH, T4TOTAL, FREET4, T3FREE, THYROIDAB in the last 72 hours. Anemia Panel: No results for input(s): VITAMINB12, FOLATE, FERRITIN, TIBC, IRON, RETICCTPCT in the last 72 hours. Urine analysis:    Component Value Date/Time   COLORURINE YELLOW 09/17/2015 1815   APPEARANCEUR CLOUDY* 09/17/2015 1815   LABSPEC 1.010 09/17/2015 1815   PHURINE 5.0 09/17/2015 1815   GLUCOSEU NEGATIVE 09/17/2015 1815   HGBUR SMALL* 09/17/2015 1815   BILIRUBINUR NEGATIVE 09/17/2015 1815   KETONESUR NEGATIVE 09/17/2015 1815   PROTEINUR NEGATIVE 09/17/2015 1815   UROBILINOGEN 1.0 03/23/2014 2156   NITRITE NEGATIVE 09/17/2015 1815   LEUKOCYTESUR LARGE* 09/17/2015 1815   Sepsis Labs: @LABRCNTIP (procalcitonin:4,lacticidven:4)  )No results found for this or any previous visit (from the past 240 hour(s)).    Radiology Studies: Dg Chest 2 View  01/10/2016  CLINICAL DATA:  82 year old male with history of left-sided chest pain today while at work, with some shortness of breath and radiation of the pain down into the left arm. EXAM: CHEST  2 VIEW COMPARISON:  Chest x-ray 09/27/2015. FINDINGS: Lung volumes are slightly low. No acute consolidative airspace disease. No pleural effusions. Mild interstitial prominence appears to be chronic. No evidence of pulmonary edema. Heart size is normal. Upper mediastinal  contours are within normal limits. Aortic atherosclerosis. Status post median sternotomy for CABG. IMPRESSION: 1. No radiographic evidence of acute cardiopulmonary disease. 2. Aortic atherosclerosis. Electronically Signed   By: Vinnie Langton M.D.   On: 01/10/2016 17:02     Scheduled Meds: . aspirin  325 mg Oral Daily  . atorvastatin  80 mg Oral Daily  . famotidine  20 mg Oral BID  . insulin aspart  0-9 Units Subcutaneous TID WC  . insulin glargine  5 Units Subcutaneous Daily  . isosorbide mononitrate  15 mg Oral Daily  . levothyroxine  175 mcg Oral QAC breakfast  . memantine  5 mg Oral Daily  . multivitamin with minerals  1 tablet Oral Daily  . sevelamer carbonate  2.4 g Oral TID WC   Continuous Infusions:      Time Spent in minutes   30 minutes  Coreon Simkins D.O. on 01/11/2016 at 1:03 PM  Between 7am to 7pm - Pager - [phone]  After 7pm go to [email or web address] - password [email or web address]  And look for the night coverage person covering for me after hours  [facility] Office  [phone]

## 2016-01-11 NOTE — Consult Note (Signed)
[facility] Nurse wound consult note Reason for Consult: Consult requested for left heel wound.   Chronic unstageable pressure injury to left heel Pressure Ulcer POA: Yes Measurement: 2X2.2cm Wound bed: 100% dry eschar, lifting slightly at wound edges. Drainage (amount, consistency, odor) No odor or drainage Periwound: Intact skin surrounding Dressing procedure/placement/frequency: Pt's wife is well-informed regarding topical treatment and states she has been using Santyl Q day prior to admission.  She just ordered a new tube for use at home and does not need more ordered at this time.  Trimmed loose edges of eschar with scissors. The rest remains tightly adhered without fluctuance. Moist gauze dressings can be used during the hospital stay and she can resume the use of Santyl after discharge. Float heels to reduce pressure. Discussed plan of care and wife verbalized understanding. Please re-consult if further assistance is needed.  Thank-you,  Julien Girt MSN, [place], [place], [place], [place]

## 2016-01-11 NOTE — Progress Notes (Signed)
[facility]  Patient Status: Active (receiving services up to time of hospitalization)  [facility] is providing the following services: RN  If patient discharges after hours, please call [phone].   Janae Sauce 01/11/2016, 11:13 AM

## 2016-01-11 NOTE — Progress Notes (Signed)
Patient's last BP reading 191/70. Has no PRN orders for high BP's. Paged on call [facility]-- K. Government social research officer. Received order to give Hydralazine injection 10 mg ONCE. Gave medication through his IV. Re-checked BP 139/50. Will continue to monitor patient.

## 2016-01-11 NOTE — Care Management Note (Signed)
Case Management Note  Patient Details  Name: [name] MRN: [id number] Date of Birth: 1934/03/24  Subjective/Objective:   Pt presented for Chest Pain. Pt is from home with wife and plan is to return home once stable. Pt is active with [facility] for wound care. Pt will need resumption orders once stable.                  Action/Plan: CM will continue to monitor for additional needs.    Expected Discharge Date:                  Expected Discharge Plan:  [place]  In-House Referral:  NA  Discharge planning Services  CM Consult  Post Acute Care Choice:  Home Health, Resumption of Svcs/PTA Provider Choice offered to:  Patient, Spouse  DME Arranged:  N/A DME Agency:  NA  HH Arranged:  RN [place] Agency:  [place]  Status of Service:  Completed, signed off  If discussed at [place] of Stay Meetings, dates discussed:    Additional Comments:  Bethena Roys, RN 01/11/2016, 11:10 AM

## 2016-01-11 NOTE — Progress Notes (Signed)
Patient's last troponin: 0.05. Paged on-call [facility] to inform about the lab value. Patient denies any chest pain, shob, nausea or vomiting. Will continue to monitor.

## 2016-01-11 NOTE — Progress Notes (Signed)
[name] is a 82 Y/O ESRD patient on hemodialysis 2/2 hypertension/diabetes, H/O diastolic HF, CABG X 2,PAF on coumadin, hypothyroidism, anemia of chronic disease, SHPT presented with C/O chest pain which occurred during hemodialysis yesterday. Troponin 0.05 X 2. Cardiology consulted. For cardiac cath tomorrow. We will enter HD orders for post cath tomorrow, will consult formally if patient's status changes to inpatient.  [name], [facility] [phone] (pager)  I have seen and examined this patient and agree with plan as outlined by Peter Fairly, NP-C. Peter John A Maisley Hainsworth,MD 01/13/2016 12:06 AM

## 2016-01-11 NOTE — Consult Note (Signed)
Reason for Consult: chest pain   Referring Physician:  Dr. Ree Kida  PCP:  Donnajean Lopes, MD  Primary Cardiologist:Dr. Lonia [name] is an 82 y.o. male.    Chief Complaint:  Chest pain admitted yesterday 01/10/16.  HPI:  82 y.o. male with history of CAD- CABG, bradycardia (off bb) , CKD Stage IV, hypothyroidism, A fib DC-CV 2015 on amio, has LUE AVF. Most recent cath 2008 LAD, CFX 100%, SVG-OM 90%, RCA 70%.  Recent echo 08/2015 EF 60-65% mild LVH, G2DD moderate aortic stenosis. Mitral valve: Severely calcified annulus. The findings are consistent with mild stenosis. There was mild regurgitation.  Last nuc 04/2011 was stable.  Last cath 2008 : "Severe native coronary artery disease but with patent grafts to the LAD, first diagonal artery and right coronary artery. The graft to the circumflex vessel has a tight stenosis, but the circumflex distribution is a very small vessel and really does not supply much in the way of myocardium. This is unchanged from his previous cath. We will continue with medical therapy. There are really no sites for intervention."  Pt had not had follow up cath since that time because of his kidney disease.    Recent hospitalization for 09/09/15 to 09/29/15 fpr acute on chronic diastolic HF and started dialysis 09/18/15 He  had a fistula placed for continued dialysis. With hospitalization he had 31 pounds removed, with Lasix drip, and then HD- now on MWF. Post discharge he went to [facility] for rehab but is now back home. He has oxygen that he uses as needed during the day but does wear at night.  Admiitted yesterday for chest pain when he developed significant chest pain at dialysis.  10/10 pain. He was given 4 baby ASA and 1 NTG with relief.  EKG with LVH and no acute changes.  Troponin 0.04 --> 0.04--> 0.05-->0.05, BNP 540   DDimer was elevated 0.69  INR 2.44   CXR aortic atherosclerosis no acute cardiopulmonary process.    Currently still mild chest pain, but not like it was.  He is NPO.  Mild SOB.    Past Medical History  Diagnosis Date  . Hypertension   . Hyperlipidemia   . Hypothyroidism   . CAD (coronary artery disease)     a. prior CABG in 1997 with redo in 2000. b. last cath in 2008 - managed medically  . Type 2 diabetes mellitus ([place])   . Generalized weakness     without overt findings  . Peripheral vascular disease ([place])   . Carotid disease, bilateral ([place])     with multiple bilateral carotid surgeries  . Psoriasis   . Meniere's disease   . Degenerative joint disease   . Gout   . Orthopnea     Two-pillow  . CKD (chronic kidney disease), stage IV ([place])     a. Has fistula in place.   . Chronic diastolic CHF (congestive heart failure) ([place])   . GERD (gastroesophageal reflux disease)   . Atrial fibrillation ([place]) Aug. 2015    a. Dx 01/2014, s/p DCCV 03/06/14.  . Sinus bradycardia     a. Toprol D/C'd due to this.   . Shingles   . On home oxygen therapy     "2L" qhs (09/09/2015  . Dementia   . Chronic respiratory failure with hypoxia [facility])     Past Surgical History  Procedure Laterality Date  . Coronary artery bypass graft  1997;  2002  . Carotid endarterectomy Bilateral "several times"    "5 on right; 2-3 on left" (09/09/2015)  . Lung removal, partial    . Lumbar laminectomy  July 2001  . Thoracotomy Left     due to fungal infection  . Shoulder surgery Bilateral   . Cardiac catheterization    . Appendectomy    . Av fistula placement Left 01/21/2013    Procedure: ARTERIOVENOUS (AV) FISTULA CREATION, Left Brachiocephalic;  Surgeon: Angelia Mould, MD;  Location: [place];  Service: Vascular;  Laterality: Left;  . Cardiac catheterization  2008    L main irreg, LAD 80%, IMA-LAD & SVG-Diag patent, CFX 100%, SVG-OM 90%, RCA 70%, SVG-RCA OK, EF nl, med rx, no vessels appropriate for PCI  . Tee without cardioversion N/A 03/06/2014    Procedure: TRANSESOPHAGEAL ECHOCARDIOGRAM (TEE);   Surgeon: Larey Dresser, MD;  Location: [place];  Service: Cardiovascular;  Laterality: N/A;  . Cardioversion N/A 03/06/2014    Procedure: CARDIOVERSION;  Surgeon: Larey Dresser, MD;  Location: [facility] ENDOSCOPY;  Service: Cardiovascular;  Laterality: N/A;  . Blepharoplasty Bilateral     Family History  Problem Relation Age of Onset  . Heart attack Father   . Heart disease Father     After 44 yrs of age  . Hyperlipidemia Father   . Hypertension Father   . Diabetes Mother   . Hypertension Mother   . Heart disease Mother   . Heart disease Brother   . Diabetes Brother    Social History:  reports that he has never smoked. He has never used smokeless tobacco. He reports that he does not drink alcohol or use illicit drugs.  Allergies:  Allergies  Allergen Reactions  . Betadine [Povidone Iodine] Rash  . Iodinated Diagnostic Agents Rash and Other (See Comments)    Does fine with premedications for cath  . Latex Hives  . Tape Rash    MUST BE FREE OF ANY LATEX    OUTPATIENT MEDICATIONS: No current facility-administered medications on file prior to encounter.   Current Outpatient Prescriptions on File Prior to Encounter  Medication Sig Dispense Refill  . acetaminophen (TYLENOL) 325 MG tablet Take 2 tablets (650 mg total) by mouth every 4 (four) hours as needed for headache or mild pain.    Marland Kitchen atorvastatin (LIPITOR) 80 MG tablet TAKE ONE TABLET BY MOUTH ONCE DAILY 90 tablet 3  . cholecalciferol (VITAMIN D) 1000 UNITS tablet Take 1,000 Units by mouth daily at 6 PM. D3    . Insulin Glargine (LANTUS SOLOSTAR) 100 UNIT/ML SOPN Inject 24 Units into the skin every morning.     . isosorbide mononitrate (IMDUR) 30 MG 24 hr tablet Take 15 mg by mouth daily.     Marland Kitchen levothyroxine (SYNTHROID, LEVOTHROID) 175 MCG tablet Take 175 mcg by mouth daily before breakfast.    . memantine (NAMENDA) 5 MG tablet Take 5 mg by mouth daily.     . Multiple Vitamins-Minerals (DECUBI-VITE) CAPS Take 1 capsule by  mouth daily.    . OXYGEN Inhale 4 L/min into the lungs continuous. To keep O2 sats >90    . ranitidine (ZANTAC) 300 MG tablet Take 300 mg by mouth daily.    . sevelamer carbonate (RENVELA) 2.4 g PACK Take 2.4 g by mouth 3 (three) times daily. 90 each   . warfarin (COUMADIN) 5 MG tablet TAKE AS DIRECTED (Patient taking differently: Take 5 mg by mouth in the evening on Sun/Mon/Tues/Wed/Thurs/Fri and 2.5 mg on Sat) 90  tablet 0  . albuterol (PROVENTIL) (2.5 MG/3ML) 0.083% nebulizer solution Take 3 mLs (2.5 mg total) by nebulization every 6 (six) hours as needed for wheezing or shortness of breath. (Patient not taking: Reported on 01/06/2016) 75 mL 12  . guaifenesin (ROBITUSSIN) 100 MG/5ML syrup Take 10 mLs (200 mg total) by mouth every 6 (six) hours as needed for cough or congestion. (Patient not taking: Reported on 01/06/2016) 120 mL 0  . pantoprazole (PROTONIX) 40 MG tablet Take 1 tablet (40 mg total) by mouth daily at 6 (six) AM. (Patient not taking: Reported on 01/10/2016)     CURRENT MEDICATIONS: Scheduled Meds: . aspirin  325 mg Oral Daily  . atorvastatin  80 mg Oral Daily  . famotidine  20 mg Oral BID  . insulin aspart  0-9 Units Subcutaneous TID WC  . insulin glargine  5 Units Subcutaneous Daily  . isosorbide mononitrate  15 mg Oral Daily  . levothyroxine  175 mcg Oral QAC breakfast  . memantine  5 mg Oral Daily  . multivitamin with minerals  1 tablet Oral Daily  . sevelamer carbonate  2.4 g Oral TID WC   Continuous Infusions:  PRN Meds:.acetaminophen, nitroGLYCERIN, ondansetron (ZOFRAN) IV    Results for orders placed or performed during the hospital encounter of 01/10/16 (from the past 48 hour(s))  Basic metabolic panel     Status: Abnormal   Collection Time: 01/10/16  3:45 PM  Result Value Ref Range   Sodium 133 (L) 135 - 145 mmol/L   Potassium 4.1 3.5 - 5.1 mmol/L   Chloride 95 (L) 101 - 111 mmol/L   CO2 32 22 - 32 mmol/L   Glucose, Bld 174 (H) 65 - 99 mg/dL   BUN 27 (H) 6 -  20 mg/dL   Creatinine, Ser 3.18 (H) 0.61 - 1.24 mg/dL   Calcium 8.0 (L) 8.9 - 10.3 mg/dL   GFR calc non Af Amer 17 (L) >60 mL/min   GFR calc Af Amer 20 (L) >60 mL/min    Comment: (NOTE) The eGFR has been calculated using the CKD EPI equation. This calculation has not been validated in all clinical situations. eGFR's persistently <60 mL/min signify possible Chronic Kidney Disease.    Anion gap 6 5 - 15  CBC     Status: Abnormal   Collection Time: 01/10/16  3:45 PM  Result Value Ref Range   WBC 5.2 4.0 - 10.5 K/uL   RBC 3.86 (L) 4.22 - 5.81 MIL/uL   Hemoglobin 11.6 (L) 13.0 - 17.0 g/dL   HCT 37.0 (L) 39.0 - 52.0 %   MCV 95.9 78.0 - 100.0 fL   MCH 30.1 26.0 - 34.0 pg   MCHC 31.4 30.0 - 36.0 g/dL   RDW 15.8 (H) 11.5 - 15.5 %   Platelets 127 (L) 150 - 400 K/uL  Brain natriuretic peptide     Status: Abnormal   Collection Time: 01/10/16  3:45 PM  Result Value Ref Range   B Natriuretic Peptide 540.5 (H) 0.0 - 100.0 pg/mL  I-stat troponin, ED     Status: None   Collection Time: 01/10/16  4:39 PM  Result Value Ref Range   Troponin i, poc 0.04 0.00 - 0.08 ng/mL   Comment 3            Comment: Due to the release kinetics of cTnI, a negative result within the first hours of the onset of symptoms does not rule out myocardial infarction with certainty. If myocardial infarction is still  suspected, repeat the test at appropriate intervals.   I-Stat Troponin, ED (not at [facility])     Status: None   Collection Time: 01/10/16  7:51 PM  Result Value Ref Range   Troponin i, poc 0.04 0.00 - 0.08 ng/mL   Comment 3            Comment: Due to the release kinetics of cTnI, a negative result within the first hours of the onset of symptoms does not rule out myocardial infarction with certainty. If myocardial infarction is still suspected, repeat the test at appropriate intervals.   Troponin I (q 6hr x 3)     Status: Abnormal   Collection Time: 01/10/16 10:29 PM  Result Value Ref Range   Troponin  I 0.05 (HH) <0.03 ng/mL    Comment: CRITICAL VALUE NOTED.  VALUE IS CONSISTENT WITH PREVIOUSLY REPORTED AND CALLED VALUE.  D-dimer, quantitative (not at [facility])     Status: Abnormal   Collection Time: 01/10/16 10:29 PM  Result Value Ref Range   D-Dimer, Quant 0.69 (H) 0.00 - 0.50 ug/mL-FEU    Comment: (NOTE) At the manufacturer cut-off of 0.50 ug/mL FEU, this assay has been documented to exclude PE with a sensitivity and negative predictive value of 97 to 99%.  At this time, this assay has not been approved by the FDA to exclude DVT/VTE. Results should be correlated with clinical presentation.   Protime-INR     Status: Abnormal   Collection Time: 01/10/16 10:29 PM  Result Value Ref Range   Prothrombin Time 24.8 (H) 11.6 - 15.2 seconds   INR 2.27 (H) 0.00 - 1.49  Glucose, capillary     Status: Abnormal   Collection Time: 01/10/16 11:55 PM  Result Value Ref Range   Glucose-Capillary 167 (H) 65 - 99 mg/dL  Troponin I (q 6hr x 3)     Status: Abnormal   Collection Time: 01/11/16  4:14 AM  Result Value Ref Range   Troponin I 0.05 (HH) <0.03 ng/mL    Comment: CRITICAL VALUE NOTED.  VALUE IS CONSISTENT WITH PREVIOUSLY REPORTED AND CALLED VALUE.  Protime-INR     Status: Abnormal   Collection Time: 01/11/16  4:14 AM  Result Value Ref Range   Prothrombin Time 26.2 (H) 11.6 - 15.2 seconds   INR 2.44 (H) 0.00 - 1.49  Glucose, capillary     Status: None   Collection Time: 01/11/16  7:56 AM  Result Value Ref Range   Glucose-Capillary 88 65 - 99 mg/dL   Dg Chest 2 View  01/10/2016  CLINICAL DATA:  82 year old male with history of left-sided chest pain today while at work, with some shortness of breath and radiation of the pain down into the left arm. EXAM: CHEST  2 VIEW COMPARISON:  Chest x-ray 09/27/2015. FINDINGS: Lung volumes are slightly low. No acute consolidative airspace disease. No pleural effusions. Mild interstitial prominence appears to be chronic. No evidence of pulmonary edema. Heart  size is normal. Upper mediastinal contours are within normal limits. Aortic atherosclerosis. Status post median sternotomy for CABG. IMPRESSION: 1. No radiographic evidence of acute cardiopulmonary disease. 2. Aortic atherosclerosis. Electronically Signed   By: Vinnie Langton M.D.   On: 01/10/2016 17:02    ROS: General:no colds or fevers, no weight changes Skin:no rashes + ulcers Lt foot- home health follows.  HEENT:no blurred vision, no congestion CV:see HPI PUL:see HPI GI:no diarrhea constipation or melena, no indigestion GU:no hematuria, no dysuria MS:no joint pain, no claudication Neuro:no syncope, no lightheadedness  Endo:+ diabetes, no thyroid disease  Blood pressure 169/57, pulse 58, temperature 98.1 F (36.7 C), temperature source Oral, resp. rate 18, height 5' 10"  (1.778 m), weight 172 lb 1.6 oz (78.064 kg), SpO2 100 %.  Wt Readings from Last 3 Encounters:  01/11/16 172 lb 1.6 oz (78.064 kg)  01/06/16 169 lb (76.658 kg)  11/15/15 175 lb 12.8 oz (79.742 kg)    PE: General:Pleasant affect, NAD Skin:Warm and dry, brisk capillary refill HEENT:normocephalic, sclera clear, mucus membranes moist Neck:supple, no JVD, + bruits Hx bil CEA and now Rt carotid stenosis of  60-80% Heart:S1S2 RRR with 3/6 systolic murmur, no gallup, rub or click Lungs:clear without rales, rhonchi, or wheezes OVP:CHEK, non tender, + BS, do not palpate liver spleen or masses Ext:no lower ext edema,  2+ radial pulses  Neuro:alert and oriented X 3, MAE, follows commands, + facial symmetry   Assessment/Plan Principal Problem:   Chest pain-- troponins minimally elevated mild pain continues - ? Cath vs. nuc study Dr. Harrington Challenger to see  Pt was on coumadin and has been held.  INR still elevated today 2.44  If elevated tomorrow with give sm. Dose of vit K  Active Problems:   Diabetes mellitus with peripheral vascular disease (HCC)   CAD (coronary artery disease)   Atrial fibrillation ([place])- maintaining SR  actually only PAF   Chronic combined systolic and diastolic CHF (congestive heart failure) (HCC)   ESRD on dialysis ([place]) MWF   Aortic stenosis    PAD followed by Dr. Arlester Marker  Nurse Practitioner Certified [facility] Pager [phone] or after 5pm or weekends call [phone] 01/11/2016, 9:03 AM  Patient is seen and examined  Agree with findings as noted by L [place] ON exam, lungs are clear  Cardiac RRR  No S3  Ext with tr edema    Symptoms concerning for angina  Dialysis was terminated early.   Given known CAD remote CABG and remote cath I would recomm L heart cath to define anatomy   Pt curr with INR 2.44  WIll give very low dose Vit K  He has maintained SR>  Dorris Carnes

## 2016-01-12 ENCOUNTER — Encounter (HOSPITAL_COMMUNITY): Payer: Self-pay | Admitting: Interventional Cardiology

## 2016-01-12 ENCOUNTER — Ambulatory Visit (HOSPITAL_COMMUNITY)
Admission: RE | Admit: 2016-01-12 | Payer: Medicare Other | Source: Ambulatory Visit | Admitting: Interventional Cardiology

## 2016-01-12 ENCOUNTER — Encounter (HOSPITAL_COMMUNITY)
Admission: EM | Disposition: A | Discharge status: Home Health | Payer: Self-pay | Source: Home / Self Care | Attending: Internal Medicine

## 2016-01-12 DIAGNOSIS — E1151 Type 2 diabetes mellitus with diabetic peripheral angiopathy without gangrene: Secondary | ICD-10-CM

## 2016-01-12 DIAGNOSIS — Z992 Dependence on renal dialysis: Secondary | ICD-10-CM

## 2016-01-12 DIAGNOSIS — N186 End stage renal disease: Secondary | ICD-10-CM

## 2016-01-12 DIAGNOSIS — I2510 Atherosclerotic heart disease of native coronary artery without angina pectoris: Secondary | ICD-10-CM

## 2016-01-12 DIAGNOSIS — I25708 Atherosclerosis of coronary artery bypass graft(s), unspecified, with other forms of angina pectoris: Secondary | ICD-10-CM

## 2016-01-12 DIAGNOSIS — I5042 Chronic combined systolic (congestive) and diastolic (congestive) heart failure: Secondary | ICD-10-CM

## 2016-01-12 HISTORY — PX: CARDIAC CATHETERIZATION: SHX172

## 2016-01-12 LAB — BASIC METABOLIC PANEL
ANION GAP: 13 (ref 5–15)
BUN: 49 mg/dL — AB (ref 6–20)
CALCIUM: 9.3 mg/dL (ref 8.9–10.3)
CO2: 26 mmol/L (ref 22–32)
Chloride: 95 mmol/L — ABNORMAL LOW (ref 101–111)
Creatinine, Ser: 5.42 mg/dL — ABNORMAL HIGH (ref 0.61–1.24)
GFR calc Af Amer: 10 mL/min — ABNORMAL LOW (ref >60)
GFR, EST NON AFRICAN AMERICAN: 9 mL/min — AB (ref >60)
GLUCOSE: 283 mg/dL — AB (ref 65–99)
Potassium: 4.7 mmol/L (ref 3.5–5.1)
SODIUM: 134 mmol/L — AB (ref 135–145)

## 2016-01-12 LAB — CBC
HCT: 40.2 % (ref 39.0–52.0)
HEMOGLOBIN: 13.1 g/dL (ref 13.0–17.0)
MCH: 30.5 pg (ref 26.0–34.0)
MCHC: 32.6 g/dL (ref 30.0–36.0)
MCV: 93.7 fL (ref 78.0–100.0)
Platelets: 124 10*3/uL — ABNORMAL LOW (ref 150–400)
RBC: 4.29 MIL/uL (ref 4.22–5.81)
RDW: 15.5 % (ref 11.5–15.5)
WBC: 4 10*3/uL (ref 4.0–10.5)

## 2016-01-12 LAB — PROTIME-INR
INR: 1.34 (ref 0.00–1.49)
PROTHROMBIN TIME: 16.7 s — AB (ref 11.6–15.2)

## 2016-01-12 LAB — GLUCOSE, CAPILLARY
GLUCOSE-CAPILLARY: 292 mg/dL — AB (ref 65–99)
Glucose-Capillary: 246 mg/dL — ABNORMAL HIGH (ref 65–99)
Glucose-Capillary: 359 mg/dL — ABNORMAL HIGH (ref 65–99)

## 2016-01-12 SURGERY — LEFT HEART CATH AND CORS/GRAFTS ANGIOGRAPHY

## 2016-01-12 MED ORDER — HYDRALAZINE HCL 20 MG/ML IJ SOLN
INTRAMUSCULAR | Status: AC
  Filled 2016-01-12: qty 1

## 2016-01-12 MED ORDER — SODIUM CHLORIDE 0.9% FLUSH: 3.0000 mL | INTRAVENOUS | Status: DC | PRN

## 2016-01-12 MED ORDER — IOPAMIDOL (ISOVUE-370) INJECTION 76%
INTRAVENOUS | Status: DC | PRN
  Administered 2016-01-12: 115 mL via INTRAVENOUS

## 2016-01-12 MED ORDER — SODIUM CHLORIDE 0.9 % IV SOLN: 100.0000 mL | INTRAVENOUS | Status: DC | PRN

## 2016-01-12 MED ORDER — LIDOCAINE HCL (PF) 1 % IJ SOLN
INTRAMUSCULAR | Status: AC
Start: 2016-01-12 — End: 2016-01-12
  Filled 2016-01-12: qty 30

## 2016-01-12 MED ORDER — WARFARIN - PHARMACIST DOSING INPATIENT
Freq: Every day | Status: DC
Start: 2016-01-12 — End: 2016-01-13

## 2016-01-12 MED ORDER — LIDOCAINE HCL (PF) 1 % IJ SOLN
5.0000 mL | INTRAMUSCULAR | Status: DC | PRN
Start: 2016-01-12 — End: 2016-01-12

## 2016-01-12 MED ORDER — ONDANSETRON HCL 4 MG/2ML IJ SOLN: 4.0000 mg | Freq: Four times a day (QID) | INTRAMUSCULAR | Status: DC | PRN

## 2016-01-12 MED ORDER — HYDRALAZINE HCL 20 MG/ML IJ SOLN
INTRAMUSCULAR | Status: DC | PRN
  Administered 2016-01-12: 10 mg via INTRAVENOUS

## 2016-01-12 MED ORDER — HEPARIN (PORCINE) IN NACL 2-0.9 UNIT/ML-% IJ SOLN
INTRAMUSCULAR | Status: DC | PRN
  Administered 2016-01-12: 12:00:00

## 2016-01-12 MED ORDER — OXYCODONE-ACETAMINOPHEN 5-325 MG PO TABS
ORAL_TABLET | ORAL | Status: AC
  Administered 2016-01-12: 2 via ORAL
  Filled 2016-01-12: qty 2

## 2016-01-12 MED ORDER — IOPAMIDOL (ISOVUE-370) INJECTION 76%
INTRAVENOUS | Status: AC
  Filled 2016-01-12: qty 125

## 2016-01-12 MED ORDER — LIDOCAINE HCL (PF) 1 % IJ SOLN
INTRAMUSCULAR | Status: DC | PRN
  Administered 2016-01-12: 20 mL

## 2016-01-12 MED ORDER — SODIUM CHLORIDE 0.9 % IV SOLN: 250.0000 mL | INTRAVENOUS | Status: DC | PRN

## 2016-01-12 MED ORDER — MIDAZOLAM HCL 2 MG/2ML IJ SOLN
INTRAMUSCULAR | Status: DC | PRN
  Administered 2016-01-12: 1 mg via INTRAVENOUS

## 2016-01-12 MED ORDER — ACETAMINOPHEN 325 MG PO TABS: 650.0000 mg | ORAL_TABLET | ORAL | Status: DC | PRN

## 2016-01-12 MED ORDER — HEPARIN (PORCINE) IN NACL 100-0.45 UNIT/ML-% IJ SOLN
1100.0000 [IU]/h | INTRAMUSCULAR | Status: DC
  Administered 2016-01-12: 850 [IU]/h via INTRAVENOUS
  Filled 2016-01-12 (×2): qty 250

## 2016-01-12 MED ORDER — WARFARIN SODIUM 7.5 MG PO TABS
7.5000 mg | ORAL_TABLET | Freq: Once | ORAL | Status: AC
  Administered 2016-01-12: 7.5 mg via ORAL
  Filled 2016-01-12 (×2): qty 1

## 2016-01-12 MED ORDER — PENTAFLUOROPROP-TETRAFLUOROETH EX AERO: 1.0000 "application " | INHALATION_SPRAY | CUTANEOUS | Status: DC | PRN

## 2016-01-12 MED ORDER — HEPARIN (PORCINE) IN NACL 100-0.45 UNIT/ML-% IJ SOLN
800.0000 [IU]/h | INTRAMUSCULAR | Status: DC
  Administered 2016-01-12: 800 [IU]/h via INTRAVENOUS
  Filled 2016-01-12: qty 250

## 2016-01-12 MED ORDER — SODIUM CHLORIDE 0.9% FLUSH: 3.0000 mL | Freq: Two times a day (BID) | INTRAVENOUS | Status: DC

## 2016-01-12 MED ORDER — NITROGLYCERIN 0.4 MG SL SUBL: 0.4000 mg | SUBLINGUAL_TABLET | SUBLINGUAL | Status: DC | PRN

## 2016-01-12 MED ORDER — OXYCODONE-ACETAMINOPHEN 5-325 MG PO TABS
1.0000 | ORAL_TABLET | ORAL | Status: DC | PRN
  Administered 2016-01-12: 2 via ORAL

## 2016-01-12 MED ORDER — MIDAZOLAM HCL 2 MG/2ML IJ SOLN
INTRAMUSCULAR | Status: AC
  Filled 2016-01-12: qty 2

## 2016-01-12 MED ORDER — LIDOCAINE-PRILOCAINE 2.5-2.5 % EX CREA
1.0000 | TOPICAL_CREAM | CUTANEOUS | Status: DC | PRN
Start: 2016-01-12 — End: 2016-01-12

## 2016-01-12 MED ORDER — HEPARIN (PORCINE) IN NACL 2-0.9 UNIT/ML-% IJ SOLN
INTRAMUSCULAR | Status: AC
  Filled 2016-01-12: qty 1500

## 2016-01-12 SURGICAL SUPPLY — 15 items
CATH INFINITI 5 FR IM (CATHETERS) ×2 IMPLANT
CATH INFINITI 5FR AL1 (CATHETERS) ×2 IMPLANT
CATH INFINITI 5FR JL4 (CATHETERS) ×2 IMPLANT
CATH SITESEER 5F MULTI A 2 (CATHETERS) ×2 IMPLANT
KIT HEART LEFT (KITS) ×3 IMPLANT
PACK CARDIAC CATHETERIZATION (CUSTOM PROCEDURE TRAY) ×3 IMPLANT
PINNACLE LONG 5F 25CM (SHEATH) ×3
SHEATH INTROD PINNACLE 5F 25CM (SHEATH) IMPLANT
SHEATH PINNACLE 5F 10CM (SHEATH) ×2 IMPLANT
TRANSDUCER W/STOPCOCK (MISCELLANEOUS) ×3 IMPLANT
TUBING CIL FLEX 10 FLL-RA (TUBING) ×3 IMPLANT
WIRE EMERALD 3MM-J .025X260CM (WIRE) IMPLANT
WIRE EMERALD 3MM-J .035X150CM (WIRE) ×2 IMPLANT
WIRE EMERALD 3MM-J .035X260CM (WIRE) ×2 IMPLANT
WIRE EMERALD ST .035X260CM (WIRE) ×2 IMPLANT

## 2016-01-12 NOTE — Procedures (Signed)
I was present at this dialysis session. I have reviewed the session itself and made appropriate changes.   Filed Weights   01/10/16 2157 01/11/16 0311 01/12/16 0516  Weight: 77.3 kg (170 lb 6.7 oz) 78.064 kg (172 lb 1.6 oz) 61.689 kg (136 lb)     Recent Labs Lab 01/12/16 0517  NA 134*  K 4.7  CL 95*  CO2 26  GLUCOSE 283*  BUN 49*  CREATININE 5.42*  CALCIUM 9.3     Recent Labs Lab 01/10/16 1545 01/12/16 0517  WBC 5.2 4.0  HGB 11.6* 13.1  HCT 37.0* 40.2  MCV 95.9 93.7  PLT 127* 124*    Scheduled Meds: . [START ON 01/13/2016] aspirin  325 mg Oral Daily  . atorvastatin  80 mg Oral Daily  . famotidine  20 mg Oral BID  . insulin aspart  0-9 Units Subcutaneous TID WC  . insulin glargine  5 Units Subcutaneous Daily  . isosorbide mononitrate  15 mg Oral Daily  . levothyroxine  175 mcg Oral QAC breakfast  . memantine  5 mg Oral Daily  . multivitamin with minerals  1 tablet Oral Daily  . sevelamer carbonate  2.4 g Oral TID WC  . sodium chloride flush  3 mL Intravenous Q12H  . sodium chloride flush  3 mL Intravenous Q12H   Continuous Infusions:  PRN Meds:.sodium chloride, sodium chloride, sodium chloride, sodium chloride, acetaminophen, lidocaine (PF), lidocaine-prilocaine, nitroGLYCERIN, ondansetron (ZOFRAN) IV, oxyCODONE-acetaminophen, pentafluoroprop-tetrafluoroeth, sodium chloride flush, sodium chloride flush   Donetta Potts,  MD 01/12/2016, 2:01 PM

## 2016-01-12 NOTE — Progress Notes (Signed)
[facility]                                                                              Patient Demographics  [name], is a 82 y.o. male, DOB - 11/06/1933, ZA:[id number]  Admit date - 01/10/2016   Admitting Physician Rise Patience, MD  Outpatient Primary MD for the patient is Donnajean Lopes, MD  Outpatient specialists:   LOS - 1  days    Chief Complaint  Patient presents with  . Chest Pain       Brief summary   [name] is a 82 y.o. male with ESRD on hemodialysis since March 2017, CAD status post CABG, atrial fibrillation on Coumadin, hypertension and diastolic CHF with moderate aortic stenosis started developing chest pressure with shortness of breath while at dialysis. Patient was transferred to hospital and en route was given sublingual nitroglycerin following which patient's symptoms improved and resolved. EKG was showing nonspecific changes and troponin was negative. Patient is presently chest pain-free and admitted for further management. Patient did not have any fever chills or productive cough.   Assessment & Plan    Chest pain-Concerning for angina,Troponin 0.05 -Cardiology was consulted, patient underwent cardiac cath  - Cardiac cath showed widely patent SVG to distal right coronary, white Lee patent LIMA to mid to distal LAD, severe native vessel coronary artery disease with total occlusion of distal RCA, total occlusion of proximal to mid LAD and total occlusion of circumflex, recommended medical therapy.  Atrial fibrillation -CHADSVASC 5 (age, DM, CHF, vascular) -Restart Coumadin once cleared by cardiology  ESRD -Patient dialyzes Monday, Wednesday, Friday. - Nephrology consulted, will completely dialysis after cardiac cath today  Chronic diastolic heart failure -2-D echo 3/17 showed moderate AS, mild AI, mild MR -Fluid management with HD  Diabetes mellitus, type II -Continue Lantus, and sliding scale, CBG  monitoring  Chronic anemia H&H stable  Dementia -On Namenda  Hypothyroidism -Continue Synthroid  Chronic left heel ulcer -Wound care Consulted  Code Status: DO NOT RESUSCITATE DVT Prophylaxis: Heparin Family Communication: Discussed in detail with the patient, all imaging results, lab results explained to the patient   Disposition Plan: Patient to undergo hemodialysis after cardiac cath today, likely DC home in a.m.  Time Spent in minutes   25 minutes  Procedures:  Cardiac cath  Bypass graft failure with occlusion of the SVG to the diagonal. There is also failure of the SVG to the obtuse marginal.  Widely patent SVG to the distal right coronary.  Widely patent LIMA to the mid to distal LAD.  Severe native vessel coronary disease with total occlusion of the distal RCA, total occlusion of the proximal to mid LAD, and total occlusion of the circumflex. The first diagonal is heavily calcified, moderate in size, and contains segmental 95% stenosis within a tortuous segment. The diagonal territory is threatened.  No significant gradient across the aortic valve. Left ventricular end-diastolic pressure is elevated greater than 25 mmHg.  RECOMMENDATIONS:   Medical therapy.  Consultants:   Cardiology Nephrology  Antimicrobials:      Medications  Scheduled Meds: . [START ON 01/13/2016] aspirin  325 mg  Oral Daily  . atorvastatin  80 mg Oral Daily  . famotidine  20 mg Oral BID  . insulin aspart  0-9 Units Subcutaneous TID WC  . insulin glargine  5 Units Subcutaneous Daily  . isosorbide mononitrate  15 mg Oral Daily  . levothyroxine  175 mcg Oral QAC breakfast  . memantine  5 mg Oral Daily  . multivitamin with minerals  1 tablet Oral Daily  . sevelamer carbonate  2.4 g Oral TID WC  . sodium chloride flush  3 mL Intravenous Q12H  . sodium chloride flush  3 mL Intravenous Q12H   Continuous Infusions:  PRN Meds:.sodium chloride, sodium chloride, sodium chloride,  sodium chloride, acetaminophen, lidocaine (PF), lidocaine-prilocaine, nitroGLYCERIN, ondansetron (ZOFRAN) IV, oxyCODONE-acetaminophen, pentafluoroprop-tetrafluoroeth, sodium chloride flush, sodium chloride flush   Antibiotics   Anti-infectives    None        Subjective:   [name] was seen and examined today.   Patient denies dizziness, chest pain, shortness of breath, abdominal pain, N/V/D/C, new weakness, numbess, tingling. No acute events overnight.    Objective:   Filed Vitals:   01/12/16 1155 01/12/16 1200 01/12/16 1205 01/12/16 1210  BP: 134/37 130/37 170/44 164/46  Pulse: 55 55 56 56  Temp:      TempSrc:      Resp: 15 15 17 16   Height:      Weight:      SpO2: 93% 99% 100% 100%    Intake/Output Summary (Last 24 hours) at 01/12/16 1244 Last data filed at 01/11/16 2300  Gross per 24 hour  Intake    363 ml  Output    100 ml  Net    263 ml     Wt Readings from Last 3 Encounters:  01/12/16 61.689 kg (136 lb)  01/06/16 76.658 kg (169 lb)  11/15/15 79.742 kg (175 lb 12.8 oz)     Exam  General: Alert and oriented x 3, NAD  HEENT:  PERRLA, EOMI, Anicteric Sclera, mucous membranes moist.   Neck: Supple, no JVD, no masses  Cardiovascular: S1 S2 auscultated, 2/6 systolic murmur, Regular rate and rhythm.  Respiratory: Clear to auscultation bilaterally, no wheezing, rales or rhonchi  Gastrointestinal: Soft, nontender, nondistended, + bowel sounds  Ext: no cyanosis clubbing or edema  Neuro: AAOx3, Cr N's II- XII. Strength 5/5 upper and lower extremities bilaterally  Skin: No rashes  Psych: Normal affect and demeanor, alert and oriented x3    Data Reviewed:  I have personally reviewed following labs and imaging studies  Micro Results No results found for this or any previous visit (from the past 240 hour(s)).  Radiology Reports Dg Chest 2 View  01/10/2016  CLINICAL DATA:  82 year old male with history of left-sided chest pain today while at work,  with some shortness of breath and radiation of the pain down into the left arm. EXAM: CHEST  2 VIEW COMPARISON:  Chest x-ray 09/27/2015. FINDINGS: Lung volumes are slightly low. No acute consolidative airspace disease. No pleural effusions. Mild interstitial prominence appears to be chronic. No evidence of pulmonary edema. Heart size is normal. Upper mediastinal contours are within normal limits. Aortic atherosclerosis. Status post median sternotomy for CABG. IMPRESSION: 1. No radiographic evidence of acute cardiopulmonary disease. 2. Aortic atherosclerosis. Electronically Signed   By: Vinnie Langton M.D.   On: 01/10/2016 17:02    Lab Data:  CBC:  Recent Labs Lab 01/10/16 1545 01/12/16 0517  WBC 5.2 4.0  HGB 11.6* 13.1  HCT 37.0* 40.2  MCV 95.9 93.7  PLT 127* [id number]*   Basic Metabolic Panel:  Recent Labs Lab 01/10/16 1545 01/12/16 0517  NA 133* 134*  K 4.1 4.7  CL 95* 95*  CO2 32 26  GLUCOSE 174* 283*  BUN 27* 49*  CREATININE 3.18* 5.42*  CALCIUM 8.0* 9.3   GFR: Estimated Creatinine Clearance: 9.6 mL/min (by C-G formula based on Cr of 5.42). Liver Function Tests: No results for input(s): AST, ALT, ALKPHOS, BILITOT, PROT, ALBUMIN in the last 168 hours. No results for input(s): LIPASE, AMYLASE in the last 168 hours. No results for input(s): AMMONIA in the last 168 hours. Coagulation Profile:  Recent Labs Lab 01/06/16 01/10/16 2229 01/11/16 0414 01/12/16 0517  INR 1.7 2.27* 2.44* 1.34   Cardiac Enzymes:  Recent Labs Lab 01/10/16 2229 01/11/16 0414 01/11/16 1113  TROPONINI 0.05* 0.05* 0.05*   BNP (last 3 results) No results for input(s): PROBNP in the last 8760 hours. HbA1C: No results for input(s): HGBA1C in the last 72 hours. CBG:  Recent Labs Lab 01/11/16 1116 01/11/16 1642 01/11/16 2018 01/12/16 0759 01/12/16 1141  GLUCAP 92 241* 224* 292* 246*   Lipid Profile: No results for input(s): CHOL, HDL, LDLCALC, TRIG, CHOLHDL, LDLDIRECT in the last 72  hours. Thyroid Function Tests: No results for input(s): TSH, T4TOTAL, FREET4, T3FREE, THYROIDAB in the last 72 hours. Anemia Panel: No results for input(s): VITAMINB12, FOLATE, FERRITIN, TIBC, IRON, RETICCTPCT in the last 72 hours. Urine analysis:    Component Value Date/Time   COLORURINE YELLOW 09/17/2015 1815   APPEARANCEUR CLOUDY* 09/17/2015 1815   LABSPEC 1.010 09/17/2015 1815   PHURINE 5.0 09/17/2015 1815   GLUCOSEU NEGATIVE 09/17/2015 1815   HGBUR SMALL* 09/17/2015 1815   BILIRUBINUR NEGATIVE 09/17/2015 1815   KETONESUR NEGATIVE 09/17/2015 1815   PROTEINUR NEGATIVE 09/17/2015 1815   UROBILINOGEN 1.0 03/23/2014 2156   NITRITE NEGATIVE 09/17/2015 1815   LEUKOCYTESUR LARGE* 09/17/2015 1815     RAI,RIPUDEEP M.D. [facility] 01/12/2016, 12:44 PM  Pager: [phone] Between 7am to 7pm - call Pager - [phone]  After 7pm go to [email or web address] - password [email or web address]  Call night coverage person covering after 7pm

## 2016-01-12 NOTE — Progress Notes (Signed)
3w31 Harlan: Pt's right groin level 0, no bleeding or hematoma noted. No complaints at this per pt. Will continue to monitor pt. Rosana Fret RN

## 2016-01-12 NOTE — H&P (View-Only) (Signed)
Reason for Consult: chest pain   Referring Physician:  Dr. Ree Kida  PCP:  Donnajean Lopes, MD  Primary Cardiologist:Dr. Lonia Skinner is an 82 y.o. male.    Chief Complaint:  Chest pain admitted yesterday 01/10/16.  HPI:  82 y.o. male with history of CAD- CABG, bradycardia (off bb) , CKD Stage IV, hypothyroidism, A fib DC-CV 2015 on amio, has LUE AVF. Most recent cath 2008 LAD, CFX 100%, SVG-OM 90%, RCA 70%.  Recent echo 08/2015 EF 60-65% mild LVH, G2DD moderate aortic stenosis. Mitral valve: Severely calcified annulus. The findings are consistent with mild stenosis. There was mild regurgitation.  Last nuc 04/2011 was stable.  Last cath 2008 : "Severe native coronary artery disease but with patent grafts to the LAD, first diagonal artery and right coronary artery. The graft to the circumflex vessel has a tight stenosis, but the circumflex distribution is a very small vessel and really does not supply much in the way of myocardium. This is unchanged from his previous cath. We will continue with medical therapy. There are really no sites for intervention."  Pt had not had follow up cath since that time because of his kidney disease.    Recent hospitalization for 09/09/15 to 09/29/15 fpr acute on chronic diastolic HF and started dialysis 09/18/15 He  had a fistula placed for continued dialysis. With hospitalization he had 31 pounds removed, with Lasix drip, and then HD- now on MWF. Post discharge he went to [facility] for rehab but is now back home. He has oxygen that he uses as needed during the day but does wear at night.  Admiitted yesterday for chest pain when he developed significant chest pain at dialysis.  10/10 pain. He was given 4 baby ASA and 1 NTG with relief.  EKG with LVH and no acute changes.  Troponin 0.04 --> 0.04--> 0.05-->0.05, BNP 540   DDimer was elevated 0.69  INR 2.44   CXR aortic atherosclerosis no acute cardiopulmonary process.    Currently still mild chest pain, but not like it was.  He is NPO.  Mild SOB.    Past Medical History  Diagnosis Date  . Hypertension   . Hyperlipidemia   . Hypothyroidism   . CAD (coronary artery disease)     a. prior CABG in 1997 with redo in 2000. b. last cath in 2008 - managed medically  . Type 2 diabetes mellitus ([place])   . Generalized weakness     without overt findings  . Peripheral vascular disease ([place])   . Carotid disease, bilateral ([place])     with multiple bilateral carotid surgeries  . Psoriasis   . Meniere's disease   . Degenerative joint disease   . Gout   . Orthopnea     Two-pillow  . CKD (chronic kidney disease), stage IV ([place])     a. Has fistula in place.   . Chronic diastolic CHF (congestive heart failure) ([place])   . GERD (gastroesophageal reflux disease)   . Atrial fibrillation ([place]) Aug. 2015    a. Dx 01/2014, s/p DCCV 03/06/14.  . Sinus bradycardia     a. Toprol D/C'd due to this.   . Shingles   . On home oxygen therapy     "2L" qhs (09/09/2015  . Dementia   . Chronic respiratory failure with hypoxia [facility])     Past Surgical History  Procedure Laterality Date  . Coronary artery bypass graft  1997;  2002  . Carotid endarterectomy Bilateral "several times"    "5 on right; 2-3 on left" (09/09/2015)  . Lung removal, partial    . Lumbar laminectomy  July 2001  . Thoracotomy Left     due to fungal infection  . Shoulder surgery Bilateral   . Cardiac catheterization    . Appendectomy    . Av fistula placement Left 01/21/2013    Procedure: ARTERIOVENOUS (AV) FISTULA CREATION, Left Brachiocephalic;  Surgeon: Angelia Mould, MD;  Location: [facility];  Service: Vascular;  Laterality: Left;  . Cardiac catheterization  2008    L main irreg, LAD 80%, IMA-LAD & SVG-Diag patent, CFX 100%, SVG-OM 90%, RCA 70%, SVG-RCA OK, EF nl, med rx, no vessels appropriate for PCI  . Tee without cardioversion N/A 03/06/2014    Procedure: TRANSESOPHAGEAL ECHOCARDIOGRAM (TEE);   Surgeon: Larey Dresser, MD;  Location: [place];  Service: Cardiovascular;  Laterality: N/A;  . Cardioversion N/A 03/06/2014    Procedure: CARDIOVERSION;  Surgeon: Larey Dresser, MD;  Location: [facility] ENDOSCOPY;  Service: Cardiovascular;  Laterality: N/A;  . Blepharoplasty Bilateral     Family History  Problem Relation Age of Onset  . Heart attack Father   . Heart disease Father     After 76 yrs of age  . Hyperlipidemia Father   . Hypertension Father   . Diabetes Mother   . Hypertension Mother   . Heart disease Mother   . Heart disease Brother   . Diabetes Brother    Social History:  reports that he has never smoked. He has never used smokeless tobacco. He reports that he does not drink alcohol or use illicit drugs.  Allergies:  Allergies  Allergen Reactions  . Betadine [Povidone Iodine] Rash  . Iodinated Diagnostic Agents Rash and Other (See Comments)    Does fine with premedications for cath  . Latex Hives  . Tape Rash    MUST BE FREE OF ANY LATEX    OUTPATIENT MEDICATIONS: No current facility-administered medications on file prior to encounter.   Current Outpatient Prescriptions on File Prior to Encounter  Medication Sig Dispense Refill  . acetaminophen (TYLENOL) 325 MG tablet Take 2 tablets (650 mg total) by mouth every 4 (four) hours as needed for headache or mild pain.    Marland Kitchen atorvastatin (LIPITOR) 80 MG tablet TAKE ONE TABLET BY MOUTH ONCE DAILY 90 tablet 3  . cholecalciferol (VITAMIN D) 1000 UNITS tablet Take 1,000 Units by mouth daily at 6 PM. D3    . Insulin Glargine (LANTUS SOLOSTAR) 100 UNIT/ML SOPN Inject 24 Units into the skin every morning.     . isosorbide mononitrate (IMDUR) 30 MG 24 hr tablet Take 15 mg by mouth daily.     Marland Kitchen levothyroxine (SYNTHROID, LEVOTHROID) 175 MCG tablet Take 175 mcg by mouth daily before breakfast.    . memantine (NAMENDA) 5 MG tablet Take 5 mg by mouth daily.     . Multiple Vitamins-Minerals (DECUBI-VITE) CAPS Take 1 capsule by  mouth daily.    . OXYGEN Inhale 4 L/min into the lungs continuous. To keep O2 sats >90    . ranitidine (ZANTAC) 300 MG tablet Take 300 mg by mouth daily.    . sevelamer carbonate (RENVELA) 2.4 g PACK Take 2.4 g by mouth 3 (three) times daily. 90 each   . warfarin (COUMADIN) 5 MG tablet TAKE AS DIRECTED (Patient taking differently: Take 5 mg by mouth in the evening on Sun/Mon/Tues/Wed/Thurs/Fri and 2.5 mg on Sat) 90  tablet 0  . albuterol (PROVENTIL) (2.5 MG/3ML) 0.083% nebulizer solution Take 3 mLs (2.5 mg total) by nebulization every 6 (six) hours as needed for wheezing or shortness of breath. (Patient not taking: Reported on 01/06/2016) 75 mL 12  . guaifenesin (ROBITUSSIN) 100 MG/5ML syrup Take 10 mLs (200 mg total) by mouth every 6 (six) hours as needed for cough or congestion. (Patient not taking: Reported on 01/06/2016) 120 mL 0  . pantoprazole (PROTONIX) 40 MG tablet Take 1 tablet (40 mg total) by mouth daily at 6 (six) AM. (Patient not taking: Reported on 01/10/2016)     CURRENT MEDICATIONS: Scheduled Meds: . aspirin  325 mg Oral Daily  . atorvastatin  80 mg Oral Daily  . famotidine  20 mg Oral BID  . insulin aspart  0-9 Units Subcutaneous TID WC  . insulin glargine  5 Units Subcutaneous Daily  . isosorbide mononitrate  15 mg Oral Daily  . levothyroxine  175 mcg Oral QAC breakfast  . memantine  5 mg Oral Daily  . multivitamin with minerals  1 tablet Oral Daily  . sevelamer carbonate  2.4 g Oral TID WC   Continuous Infusions:  PRN Meds:.acetaminophen, nitroGLYCERIN, ondansetron (ZOFRAN) IV    Results for orders placed or performed during the hospital encounter of 01/10/16 (from the past 48 hour(s))  Basic metabolic panel     Status: Abnormal   Collection Time: 01/10/16  3:45 PM  Result Value Ref Range   Sodium 133 (L) 135 - 145 mmol/L   Potassium 4.1 3.5 - 5.1 mmol/L   Chloride 95 (L) 101 - 111 mmol/L   CO2 32 22 - 32 mmol/L   Glucose, Bld 174 (H) 65 - 99 mg/dL   BUN 27 (H) 6 -  20 mg/dL   Creatinine, Ser 3.18 (H) 0.61 - 1.24 mg/dL   Calcium 8.0 (L) 8.9 - 10.3 mg/dL   GFR calc non Af Amer 17 (L) >60 mL/min   GFR calc Af Amer 20 (L) >60 mL/min    Comment: (NOTE) The eGFR has been calculated using the CKD EPI equation. This calculation has not been validated in all clinical situations. eGFR's persistently <60 mL/min signify possible Chronic Kidney Disease.    Anion gap 6 5 - 15  CBC     Status: Abnormal   Collection Time: 01/10/16  3:45 PM  Result Value Ref Range   WBC 5.2 4.0 - 10.5 K/uL   RBC 3.86 (L) 4.22 - 5.81 MIL/uL   Hemoglobin 11.6 (L) 13.0 - 17.0 g/dL   HCT 37.0 (L) 39.0 - 52.0 %   MCV 95.9 78.0 - 100.0 fL   MCH 30.1 26.0 - 34.0 pg   MCHC 31.4 30.0 - 36.0 g/dL   RDW 15.8 (H) 11.5 - 15.5 %   Platelets 127 (L) 150 - 400 K/uL  Brain natriuretic peptide     Status: Abnormal   Collection Time: 01/10/16  3:45 PM  Result Value Ref Range   B Natriuretic Peptide 540.5 (H) 0.0 - 100.0 pg/mL  I-stat troponin, ED     Status: None   Collection Time: 01/10/16  4:39 PM  Result Value Ref Range   Troponin i, poc 0.04 0.00 - 0.08 ng/mL   Comment 3            Comment: Due to the release kinetics of cTnI, a negative result within the first hours of the onset of symptoms does not rule out myocardial infarction with certainty. If myocardial infarction is still  suspected, repeat the test at appropriate intervals.   I-Stat Troponin, ED (not at [facility])     Status: None   Collection Time: 01/10/16  7:51 PM  Result Value Ref Range   Troponin i, poc 0.04 0.00 - 0.08 ng/mL   Comment 3            Comment: Due to the release kinetics of cTnI, a negative result within the first hours of the onset of symptoms does not rule out myocardial infarction with certainty. If myocardial infarction is still suspected, repeat the test at appropriate intervals.   Troponin I (q 6hr x 3)     Status: Abnormal   Collection Time: 01/10/16 10:29 PM  Result Value Ref Range   Troponin  I 0.05 (HH) <0.03 ng/mL    Comment: CRITICAL VALUE NOTED.  VALUE IS CONSISTENT WITH PREVIOUSLY REPORTED AND CALLED VALUE.  D-dimer, quantitative (not at [facility])     Status: Abnormal   Collection Time: 01/10/16 10:29 PM  Result Value Ref Range   D-Dimer, Quant 0.69 (H) 0.00 - 0.50 ug/mL-FEU    Comment: (NOTE) At the manufacturer cut-off of 0.50 ug/mL FEU, this assay has been documented to exclude PE with a sensitivity and negative predictive value of 97 to 99%.  At this time, this assay has not been approved by the FDA to exclude DVT/VTE. Results should be correlated with clinical presentation.   Protime-INR     Status: Abnormal   Collection Time: 01/10/16 10:29 PM  Result Value Ref Range   Prothrombin Time 24.8 (H) 11.6 - 15.2 seconds   INR 2.27 (H) 0.00 - 1.49  Glucose, capillary     Status: Abnormal   Collection Time: 01/10/16 11:55 PM  Result Value Ref Range   Glucose-Capillary 167 (H) 65 - 99 mg/dL  Troponin I (q 6hr x 3)     Status: Abnormal   Collection Time: 01/11/16  4:14 AM  Result Value Ref Range   Troponin I 0.05 (HH) <0.03 ng/mL    Comment: CRITICAL VALUE NOTED.  VALUE IS CONSISTENT WITH PREVIOUSLY REPORTED AND CALLED VALUE.  Protime-INR     Status: Abnormal   Collection Time: 01/11/16  4:14 AM  Result Value Ref Range   Prothrombin Time 26.2 (H) 11.6 - 15.2 seconds   INR 2.44 (H) 0.00 - 1.49  Glucose, capillary     Status: None   Collection Time: 01/11/16  7:56 AM  Result Value Ref Range   Glucose-Capillary 88 65 - 99 mg/dL   Dg Chest 2 View  01/10/2016  CLINICAL DATA:  82 year old male with history of left-sided chest pain today while at work, with some shortness of breath and radiation of the pain down into the left arm. EXAM: CHEST  2 VIEW COMPARISON:  Chest x-ray 09/27/2015. FINDINGS: Lung volumes are slightly low. No acute consolidative airspace disease. No pleural effusions. Mild interstitial prominence appears to be chronic. No evidence of pulmonary edema. Heart  size is normal. Upper mediastinal contours are within normal limits. Aortic atherosclerosis. Status post median sternotomy for CABG. IMPRESSION: 1. No radiographic evidence of acute cardiopulmonary disease. 2. Aortic atherosclerosis. Electronically Signed   By: Vinnie Langton M.D.   On: 01/10/2016 17:02    ROS: General:no colds or fevers, no weight changes Skin:no rashes + ulcers Lt foot- home health follows.  HEENT:no blurred vision, no congestion CV:see HPI PUL:see HPI GI:no diarrhea constipation or melena, no indigestion GU:no hematuria, no dysuria MS:no joint pain, no claudication Neuro:no syncope, no lightheadedness  Endo:+ diabetes, no thyroid disease  Blood pressure 169/57, pulse 58, temperature 98.1 F (36.7 C), temperature source Oral, resp. rate 18, height 5' 10"  (1.778 m), weight 172 lb 1.6 oz (78.064 kg), SpO2 100 %.  Wt Readings from Last 3 Encounters:  01/11/16 172 lb 1.6 oz (78.064 kg)  01/06/16 169 lb (76.658 kg)  11/15/15 175 lb 12.8 oz (79.742 kg)    PE: General:Pleasant affect, NAD Skin:Warm and dry, brisk capillary refill HEENT:normocephalic, sclera clear, mucus membranes moist Neck:supple, no JVD, + bruits Hx bil CEA and now Rt carotid stenosis of  60-80% Heart:S1S2 RRR with 3/6 systolic murmur, no gallup, rub or click Lungs:clear without rales, rhonchi, or wheezes TKZ:SWFU, non tender, + BS, do not palpate liver spleen or masses Ext:no lower ext edema,  2+ radial pulses  Neuro:alert and oriented X 3, MAE, follows commands, + facial symmetry   Assessment/Plan Principal Problem:   Chest pain-- troponins minimally elevated mild pain continues - ? Cath vs. nuc study Dr. Harrington Challenger to see  Pt was on coumadin and has been held.  INR still elevated today 2.44  If elevated tomorrow with give sm. Dose of vit K  Active Problems:   Diabetes mellitus with peripheral vascular disease (HCC)   CAD (coronary artery disease)   Atrial fibrillation ([place])- maintaining SR  actually only PAF   Chronic combined systolic and diastolic CHF (congestive heart failure) (HCC)   ESRD on dialysis ([place]) MWF   Aortic stenosis    PAD followed by Dr. Arlester Marker  Nurse Practitioner Certified [facility] Pager [phone] or after 5pm or weekends call [phone] 01/11/2016, 9:03 AM  Patient is seen and examined  Agree with findings as noted by L INgold ON exam, lungs are clear  Cardiac RRR  No S3  Ext with tr edema    Symptoms concerning for angina  Dialysis was terminated early.   Given known CAD remote CABG and remote cath I would recomm L heart cath to define anatomy   Pt curr with INR 2.44  WIll give very low dose Vit K  He has maintained SR>  Dorris Carnes

## 2016-01-12 NOTE — Interval H&P Note (Signed)
Cath Lab Visit (complete for each Cath Lab visit)  Clinical Evaluation Leading to the Procedure:   ACS: Yes.    Non-ACS:    Anginal Classification: CCS III  Anti-ischemic medical therapy: Maximal Therapy (2 or more classes of medications)  Non-Invasive Test Results: No non-invasive testing performed  Prior CABG: Previous CABG      History and Physical Interval Note:  01/12/2016 7:29 AM  [name]  has presented today for surgery, with the diagnosis of cp  The various methods of treatment have been discussed with the patient and family. After consideration of risks, benefits and other options for treatment, the patient has consented to  Procedure(s): Left Heart Cath and Cors/Grafts Angiography (N/A) as a surgical intervention .  The patient's history has been reviewed, patient examined, no change in status, stable for surgery.  I have reviewed the patient's chart and labs.  Questions were answered to the patient's satisfaction.     Belva Crome III

## 2016-01-12 NOTE — Progress Notes (Signed)
ANTICOAGULATION CONSULT NOTE - Initial Consult  Pharmacy Consult for heparin Indication: atrial fibrillation  Allergies  Allergen Reactions  . Betadine [Povidone Iodine] Rash  . Iodinated Diagnostic Agents Rash and Other (See Comments)    Does fine with premedications for cath  . Latex Hives  . Tape Rash    MUST BE FREE OF ANY LATEX    Patient Measurements: Height: 5\' 10"  (177.8 cm) Weight: 136 lb (61.689 kg) IBW/kg (Calculated) : 73 Heparin Dosing Weight: 61.7kg  Vital Signs: Temp: 98.1 F (36.7 C) (07/19 0516) Temp Source: Oral (07/19 0516) BP: 176/60 mmHg (07/19 0658) Pulse Rate: 57 (07/19 0658)  Labs:  Recent Labs  01/10/16 1545 01/10/16 2229 01/11/16 0414 01/11/16 1113 01/12/16 0517  HGB 11.6*  --   --   --  13.1  HCT 37.0*  --   --   --  40.2  PLT 127*  --   --   --  124*  LABPROT  --  24.8* 26.2*  --  16.7*  INR  --  2.27* 2.44*  --  1.34  CREATININE 3.18*  --   --   --  5.42*  TROPONINI  --  0.05* 0.05* 0.05*  --     Estimated Creatinine Clearance: 9.6 mL/min (by C-G formula based on Cr of 5.42).   Medical History: Past Medical History  Diagnosis Date  . Hypertension   . Hyperlipidemia   . Hypothyroidism   . CAD (coronary artery disease)     a. prior CABG in 1997 with redo in 2000. b. last cath in 2008 - managed medically  . Type 2 diabetes mellitus ([place])   . Generalized weakness     without overt findings  . Peripheral vascular disease ([place])   . Carotid disease, bilateral ([place])     with multiple bilateral carotid surgeries  . Psoriasis   . Meniere's disease   . Degenerative joint disease   . Gout   . Orthopnea     Two-pillow  . CKD (chronic kidney disease), stage IV ([place])     a. Has fistula in place.   . Chronic diastolic CHF (congestive heart failure) ([place])   . GERD (gastroesophageal reflux disease)   . Atrial fibrillation ([place]) Aug. 2015    a. Dx 01/2014, s/p DCCV 03/06/14.  . Sinus bradycardia     a. Toprol D/C'd due to this.   .  Shingles   . On home oxygen therapy     "2L" qhs (09/09/2015  . Dementia   . Chronic respiratory failure with hypoxia ([facility])     Medications:  Scheduled:  . [START ON 01/13/2016] aspirin  325 mg Oral Daily  . atorvastatin  80 mg Oral Daily  . diphenhydrAMINE  25 mg Intravenous Pre-Cath  . famotidine (PEPCID) IV  20 mg Intravenous Pre-Cath  . famotidine  20 mg Oral BID  . insulin aspart  0-9 Units Subcutaneous TID WC  . insulin glargine  5 Units Subcutaneous Daily  . isosorbide mononitrate  15 mg Oral Daily  . levothyroxine  175 mcg Oral QAC breakfast  . memantine  5 mg Oral Daily  . multivitamin with minerals  1 tablet Oral Daily  . sevelamer carbonate  2.4 g Oral TID WC  . sodium chloride flush  3 mL Intravenous Q12H    Assessment: Coumadin PTA for hx AFib >> Heparin for ACS if INR < 2. INR today 1.34 following Vit K 2.5mg  PO given yesterday. Will begin heparin with conservative dosing  in the setting of CKD. Likely cath today. H&H 13.1/40.2, Plt 124.  Goal of Therapy:  Heparin level 0.3-0.7 units/ml Monitor platelets by anticoagulation protocol: Yes   Plan:  -Start heparin at 850 units/hr (no bolus) -8hr HL -Daily CBC/HL -Monitor s/sx bleeding -F/U [facility] plans post-cath  Stephens November, PharmD Clinical Pharmacist  7:36 AM, 01/12/2016

## 2016-01-12 NOTE — Consult Note (Signed)
[facility] Renal Consultation Note    Indication for Consultation:  Management of ESRD/hemodialysis; anemia, hypertension/volume and secondary hyperparathyroidism PCP: Donnajean Lopes, MD  HPI: [name] is a 82 y.o. male with ESRD on hemodialysis MWF at [facility]. First HD 09/19/15 after CHF exacerbation pushed him from CKD 4 to acute then end stage renal failure. PMH significant for hypertension, DM, CABG X 2, HTN, chronic diastolic HF, MR/MS, AS, hyperlidemia, multiple carotid endarterectomies (last 09/09/15), PAD, Lobectomy, gout, PAF on coumadin, chronic respiratory failure, dementia, psoriasis, Meniere's disease, shingles, anemia of chronic disease, SHPT.   Patient was admitted to hospital as observation patient 01/10/16 after he developed chest pain on hemodialysis Monday. He was given baby ASA, and NTG SL with relief of chest pain. Troponin minimally elevated, flat 0.05 X 2. 2D echo done-EF 0-65% Mild LVH, moderate AS. Severely calcified mitral valve, Mod TR, mod increase pulmonary pressures. EKG SR LVH R BBB.  Patient is poor historian, wife attempting to help with history. Patient states he developed chest pain, had to "leave my treatment early". No C/O chest pain at present. No other complaints. Currently denies chest pain, fever, chills, N,V,D, abdominal pain, flank pain, melena, hematochezia, dysuria, HA, vision or hearing changes. Patient has been in [facility] for rehabilitation is now back home with wife. He has ulcer L heal which is slowly healing.  Patient does not remember me from dialysis center although he has seen me many times. Plan for cardiac cath today per cardiology.   Past Medical History  Diagnosis Date  . Hypertension   . Hyperlipidemia   . Hypothyroidism   . CAD (coronary artery disease)     a. prior CABG in 1997 with redo in 2000. b. last cath in 2008 - managed medically  . Type 2 diabetes mellitus ([place])   . Generalized  weakness     without overt findings  . Peripheral vascular disease ([place])   . Carotid disease, bilateral ([place])     with multiple bilateral carotid surgeries  . Psoriasis   . Meniere's disease   . Degenerative joint disease   . Gout   . Orthopnea     Two-pillow  . CKD (chronic kidney disease), stage IV ([place])     a. Has fistula in place.   . Chronic diastolic CHF (congestive heart failure) ([place])   . GERD (gastroesophageal reflux disease)   . Atrial fibrillation ([place]) Aug. 2015    a. Dx 01/2014, s/p DCCV 03/06/14.  . Sinus bradycardia     a. Toprol D/C'd due to this.   . Shingles   . On home oxygen therapy     "2L" qhs (09/09/2015  . Dementia   . Chronic respiratory failure with hypoxia [facility])    Past Surgical History  Procedure Laterality Date  . Coronary artery bypass graft  1997;  2002  . Carotid endarterectomy Bilateral "several times"    "5 on right; 2-3 on left" (09/09/2015)  . Lung removal, partial    . Lumbar laminectomy  July 2001  . Thoracotomy Left     due to fungal infection  . Shoulder surgery Bilateral   . Cardiac catheterization    . Appendectomy    . Av fistula placement Left 01/21/2013    Procedure: ARTERIOVENOUS (AV) FISTULA CREATION, Left Brachiocephalic;  Surgeon: Angelia Mould, MD;  Location: [place];  Service: Vascular;  Laterality: Left;  . Cardiac catheterization  2008    L main irreg, LAD 80%, IMA-LAD &  SVG-Diag patent, CFX 100%, SVG-OM 90%, RCA 70%, SVG-RCA OK, EF nl, med rx, no vessels appropriate for PCI  . Tee without cardioversion N/A 03/06/2014    Procedure: TRANSESOPHAGEAL ECHOCARDIOGRAM (TEE);  Surgeon: Larey Dresser, MD;  Location: [place];  Service: Cardiovascular;  Laterality: N/A;  . Cardioversion N/A 03/06/2014    Procedure: CARDIOVERSION;  Surgeon: Larey Dresser, MD;  Location: [facility] ENDOSCOPY;  Service: Cardiovascular;  Laterality: N/A;  . Blepharoplasty Bilateral    Family History  Problem Relation Age of Onset  . Heart attack  Father   . Heart disease Father     After 49 yrs of age  . Hyperlipidemia Father   . Hypertension Father   . Diabetes Mother   . Hypertension Mother   . Heart disease Mother   . Heart disease Brother   . Diabetes Brother    Social History:  reports that he has never smoked. He has never used smokeless tobacco. He reports that he does not drink alcohol or use illicit drugs. Allergies  Allergen Reactions  . Betadine [Povidone Iodine] Rash  . Iodinated Diagnostic Agents Rash and Other (See Comments)    Does fine with premedications for cath  . Latex Hives  . Tape Rash    MUST BE FREE OF ANY LATEX   Prior to Admission medications   Medication Sig Start Date End Date Taking? Authorizing Provider  acetaminophen (TYLENOL) 325 MG tablet Take 2 tablets (650 mg total) by mouth every 4 (four) hours as needed for headache or mild pain. 09/29/15  Yes Amy D Clegg, NP  atorvastatin (LIPITOR) 80 MG tablet TAKE ONE TABLET BY MOUTH ONCE DAILY 02/26/15  Yes Thayer Headings, MD  cholecalciferol (VITAMIN D) 1000 UNITS tablet Take 1,000 Units by mouth daily at 6 PM. D3   Yes Historical Provider, MD  insulin aspart (NOVOLOG FLEXPEN) 100 UNIT/ML FlexPen Inject 8 Units into the skin 2 (two) times daily before a meal.   Yes Historical Provider, MD  Insulin Glargine (LANTUS SOLOSTAR) 100 UNIT/ML SOPN Inject 24 Units into the skin every morning.    Yes Historical Provider, MD  isosorbide mononitrate (IMDUR) 30 MG 24 hr tablet Take 15 mg by mouth daily.    Yes Historical Provider, MD  levothyroxine (SYNTHROID, LEVOTHROID) 175 MCG tablet Take 175 mcg by mouth daily before breakfast.   Yes Historical Provider, MD  memantine (NAMENDA) 5 MG tablet Take 5 mg by mouth daily.  03/12/15  Yes Historical Provider, MD  Multiple Vitamins-Minerals (DECUBI-VITE) CAPS Take 1 capsule by mouth daily.   Yes Historical Provider, MD  OXYGEN Inhale 4 L/min into the lungs continuous. To keep O2 sats >90   Yes Historical Provider, MD   ranitidine (ZANTAC) 300 MG tablet Take 300 mg by mouth daily. 12/04/15  Yes Historical Provider, MD  SANTYL ointment Apply topically to affected foot 12/30/15  Yes Historical Provider, MD  sevelamer carbonate (RENVELA) 2.4 g PACK Take 2.4 g by mouth 3 (three) times daily. 09/29/15  Yes Amy D Ninfa Meeker, NP  warfarin (COUMADIN) 5 MG tablet TAKE AS DIRECTED Patient taking differently: Take 5 mg by mouth in the evening on Sun/Mon/Tues/Wed/Thurs/Fri and 2.5 mg on Sat 12/31/15  Yes Thayer Headings, MD  albuterol (PROVENTIL) (2.5 MG/3ML) 0.083% nebulizer solution Take 3 mLs (2.5 mg total) by nebulization every 6 (six) hours as needed for wheezing or shortness of breath. Patient not taking: Reported on 01/06/2016 09/29/15   Amy D Clegg, NP  diphenhydrAMINE (BENADRYL) 25 mg  capsule Take 50 mg by mouth See admin instructions. To be taken as a one-time dose for procedure on 01/13/16    Historical Provider, MD  guaifenesin (ROBITUSSIN) 100 MG/5ML syrup Take 10 mLs (200 mg total) by mouth every 6 (six) hours as needed for cough or congestion. Patient not taking: Reported on 01/06/2016 09/29/15   Amy D Clegg, NP  pantoprazole (PROTONIX) 40 MG tablet Take 1 tablet (40 mg total) by mouth daily at 6 (six) AM. Patient not taking: Reported on 01/10/2016 09/29/15   Amy D Clegg, NP  predniSONE (DELTASONE) 20 MG tablet Take 40 mg by mouth. 40 mg at 1900 on 01/12/16 then 40 mg at 2300 then 40 mg morning of 01/13/16 01/07/16   Historical Provider, MD   Current Facility-Administered Medications  Medication Dose Route Frequency Provider Last Rate Last Dose  . 0.9 %  sodium chloride infusion  250 mL Intravenous PRN Isaiah Serge, NP      . 0.9 %  sodium chloride infusion   Intravenous Continuous Isaiah Serge, NP 10 mL/hr at 01/12/16 [phone]    . [MAR Hold] acetaminophen (TYLENOL) tablet 650 mg  650 mg Oral Q4H PRN Rise Patience, MD      . Doug Sou Hold] aspirin tablet 325 mg  325 mg Oral Daily Maryann Mikhail, DO      . [MAR Hold]  atorvastatin (LIPITOR) tablet 80 mg  80 mg Oral Daily Rise Patience, MD   80 mg at 01/11/16 2254  . [MAR Hold] famotidine (PEPCID) tablet 20 mg  20 mg Oral BID Rise Patience, MD   20 mg at 01/11/16 2254  . heparin ADULT infusion 100 units/mL (25000 units/288mL sodium chloride 0.45%)  800 Units/hr Intravenous Continuous Ripudeep K Rai, MD 8 mL/hr at 01/12/16 0826 800 Units/hr at 01/12/16 0826  . hydrALAZINE (APRESOLINE) injection    PRN Belva Crome, MD   10 mg at 01/12/16 1117  . [MAR Hold] insulin aspart (novoLOG) injection 0-9 Units  0-9 Units Subcutaneous TID WC Rise Patience, MD   5 Units at 01/12/16 [phone]  . [MAR Hold] insulin glargine (LANTUS) injection 5 Units  5 Units Subcutaneous Daily Rise Patience, MD   5 Units at 01/11/16 1229  . [MAR Hold] isosorbide mononitrate (IMDUR) 24 hr tablet 15 mg  15 mg Oral Daily Rise Patience, MD   15 mg at 01/11/16 1035  . [MAR Hold] levothyroxine (SYNTHROID, LEVOTHROID) tablet 175 mcg  175 mcg Oral QAC breakfast Rise Patience, MD   175 mcg at 01/12/16 0826  . lidocaine (PF) (XYLOCAINE) 1 % injection    PRN Belva Crome, MD   20 mL at 01/12/16 1032  . [MAR Hold] memantine (NAMENDA) tablet 5 mg  5 mg Oral Daily Rise Patience, MD   5 mg at 01/11/16 1034  . midazolam (VERSED) injection    PRN Belva Crome, MD   1 mg at 01/12/16 1030  . [MAR Hold] multivitamin with minerals tablet 1 tablet  1 tablet Oral Daily Rise Patience, MD   1 tablet at 01/11/16 1034  . [MAR Hold] nitroGLYCERIN (NITROSTAT) SL tablet 0.4 mg  0.4 mg Sublingual Q5 min PRN Rise Patience, MD      . Doug Sou Hold] ondansetron [facility]) injection 4 mg  4 mg Intravenous Q6H PRN Rise Patience, MD      . Doug Sou Hold] sevelamer carbonate (RENVELA) powder PACK 2.4 g  2.4 g Oral TID WC  Rise Patience, MD   2.4 g at 01/11/16 1736  . sodium chloride flush (NS) 0.9 % injection 3 mL  3 mL Intravenous Q12H Isaiah Serge, NP   3 mL at 01/11/16 2254   . sodium chloride flush (NS) 0.9 % injection 3 mL  3 mL Intravenous PRN Isaiah Serge, NP       Labs: Basic Metabolic Panel:  Recent Labs Lab 01/10/16 1545 01/12/16 0517  NA 133* 134*  K 4.1 4.7  CL 95* 95*  CO2 32 26  GLUCOSE 174* 283*  BUN 27* 49*  CREATININE 3.18* 5.42*  CALCIUM 8.0* 9.3     CBC:  Recent Labs Lab 01/10/16 1545 01/12/16 0517  WBC 5.2 4.0  HGB 11.6* 13.1  HCT 37.0* 40.2  MCV 95.9 93.7  PLT 127* 124*   Cardiac Enzymes:  Recent Labs Lab 01/10/16 2229 01/11/16 0414 01/11/16 1113  TROPONINI 0.05* 0.05* 0.05*   CBG:  Recent Labs Lab 01/11/16 0756 01/11/16 1116 01/11/16 1642 01/11/16 2018 01/12/16 0759  GLUCAP 88 92 241* 224* 292*   Studies/Results: Dg Chest 2 View  01/10/2016  CLINICAL DATA:  82 year old male with history of left-sided chest pain today while at work, with some shortness of breath and radiation of the pain down into the left arm. EXAM: CHEST  2 VIEW COMPARISON:  Chest x-ray 09/27/2015. FINDINGS: Lung volumes are slightly low. No acute consolidative airspace disease. No pleural effusions. Mild interstitial prominence appears to be chronic. No evidence of pulmonary edema. Heart size is normal. Upper mediastinal contours are within normal limits. Aortic atherosclerosis. Status post median sternotomy for CABG. IMPRESSION: 1. No radiographic evidence of acute cardiopulmonary disease. 2. Aortic atherosclerosis. Electronically Signed   By: Vinnie Langton M.D.   On: 01/10/2016 17:02    ROS: As per HPI otherwise negative.   Physical Exam: Filed Vitals:   01/12/16 1102 01/12/16 1107 01/12/16 1111 01/12/16 1116  BP: 192/76 192/78 194/81 195/80  Pulse: 57 59 57 59  Temp:      TempSrc:      Resp: 12 18 27 14   Height:      Weight:      SpO2: 98% 100% 100% 100%     General: Well developed, well nourished, in no acute distress. Head: Normocephalic, atraumatic, sclera non-icteric, mucus membranes are moist Neck: Supple. JVD  not elevated. Lungs: Clear bilaterally to auscultation without wheezes, rales, or rhonchi. Breathing is unlabored. Heart: S1,S2, RRR II/VI systolic M.  Abdomen: Soft, non-tender, non-distended with normoactive bowel sounds. No rebound/guarding. No obvious abdominal masses. M-S:  Strength and tone appear normal for age. Lower extremities: No LE edema. Drsg intact L heel.  Neuro: Alert and oriented X 3. Moves all extremities spontaneously. Psych:  Responds to questions appropriately with a normal affect. Dialysis Access: LUA AVF + bruit  Dialysis Orders: [facility] MWF 4 hours 400/800  2.0 K/2.0 Ca  Heparin 8000 units IV Q treatment Venofer 50 mg IV Q week (last dose 01/05/16 Last HGB 12 Ferritin 598 Fe 102 Tsat 39%) Renela 2.4 grams I packet TID with meals-last phos 4.9 PTH 74 (01/05/16)    Assessment/Plan: 1.  Chest pain: Cardiology consulted. H/O CAD. For cardiac cath today. Troponin 0.05.  2.  ESRD -  MWF [facility]. Will have HD post cardiac cath today. K+4.7 3.  Hypertension/volume  - No OP antihypertensives on med list. SBP 190s. Last believable wt 78 kg. Attempt 2-2.5 liters as tolerated today.  4.  Anemia  -  HGB 13.1 No ESA. DC weekly venofer.  5.  Metabolic bone disease -No calcimimetic/VDRA. Cont. Binders.  6.  Nutrition - OP Albumin 3.7. NPO at present. Renal Diet when able to eat. 7.  H/O Paroxysmal Afib:  SR at present. coumadin on hold-on heparin gtt per pharmacy 8.  Diastolic HF: per primary/Cards. 9.  DM: per primary 10. Dementia: per primary  Peter H. Owens Shark, NP-C 01/12/2016, 11:27 AM  [employer] [phone]      I have seen and examined this patient and agree with plan as outlined by Peter Fairly, NP-C. Peter Rooks Nyomi Howser,MD 01/12/2016 12:39 PM

## 2016-01-12 NOTE — Progress Notes (Signed)
ANTICOAGULATION CONSULT NOTE - Follow Up Consult  Pharmacy Consult for heparin and warfarin Indication: atrial fibrillation  Allergies  Allergen Reactions  . Betadine [Povidone Iodine] Rash  . Iodinated Diagnostic Agents Rash and Other (See Comments)    Does fine with premedications for cath  . Latex Hives  . Tape Rash    MUST BE FREE OF ANY LATEX    Patient Measurements: Height: 5\' 10"  (177.8 cm) Weight: 175 lb 14.8 oz (79.8 kg) IBW/kg (Calculated) : 73 Heparin Dosing Weight: 61.7 kg  Vital Signs: Temp: 97.5 F (36.4 C) (07/19 1330) Temp Source: Oral (07/19 1330) BP: 124/56 mmHg (07/19 1400) Pulse Rate: 59 (07/19 1400)  Labs:  Recent Labs  01/10/16 1545 01/10/16 2229 01/11/16 0414 01/11/16 1113 01/12/16 0517  HGB 11.6*  --   --   --  13.1  HCT 37.0*  --   --   --  40.2  PLT 127*  --   --   --  124*  LABPROT  --  24.8* 26.2*  --  16.7*  INR  --  2.27* 2.44*  --  1.34  CREATININE 3.18*  --   --   --  5.42*  TROPONINI  --  0.05* 0.05* 0.05*  --     Estimated Creatinine Clearance: 11.4 mL/min (by C-G formula based on Cr of 5.42).  Assessment: Pt with CP during dialysis session, brought for ACS evaluation. Pt on warfarin PTA for hx of AFib, held for cath. Vit K 2.5 mg PO given 7/18.  Cath shows occluded graft, recommending medical management per cardiac cath note - heparin to restart 8h post sheath removal (sheath removed 1200 on 7/19). Restarting warfarin as no further procedures planned at this time per cardiology. hgb 13.1, plt 124 INR 1.34. No bleeding noted.   PTA warfarin dose 5 mg all days except sat 2.5 mg   Goal of Therapy:  INR 2-3 Heparin level 0.3-0.7 units/ml Monitor platelets by anticoagulation protocol: Yes   Plan:  Restart Heparin 850 units/hr without bolus at 2000 7/19 Start warfarin 7.5 mg po x1.  HL, INR, CBC daily 8hr HL Monitor for s/sx of bleeding  Carlean Jews  PGY1 Pharmacy Resident  01/12/2016,2:28 PM

## 2016-01-12 NOTE — Progress Notes (Signed)
Patient Name: [name] Date of Encounter: 01/12/2016   SUBJECTIVE  Wife at bedside. She prefers to discuss cath result with son who is coming later today. No chest pain or sob.   CURRENT MEDS . [START ON 01/13/2016] aspirin  325 mg Oral Daily  . atorvastatin  80 mg Oral Daily  . diphenhydrAMINE  25 mg Intravenous Pre-Cath  . famotidine (PEPCID) IV  20 mg Intravenous Pre-Cath  . famotidine  20 mg Oral BID  . insulin aspart  0-9 Units Subcutaneous TID WC  . insulin glargine  5 Units Subcutaneous Daily  . isosorbide mononitrate  15 mg Oral Daily  . levothyroxine  175 mcg Oral QAC breakfast  . memantine  5 mg Oral Daily  . multivitamin with minerals  1 tablet Oral Daily  . sevelamer carbonate  2.4 g Oral TID WC  . sodium chloride flush  3 mL Intravenous Q12H    OBJECTIVE  Filed Vitals:   01/11/16 2150 01/11/16 2319 01/12/16 0516 01/12/16 0658  BP: 143/53 149/54 121/57 176/60  Pulse: 57 59 69 57  Temp:  98.5 F (36.9 C) 98.1 F (36.7 C)   TempSrc:  Oral Oral   Resp:  18 18   Height:      Weight:   136 lb (61.689 kg)   SpO2: 94% 98% 94% 98%    Intake/Output Summary (Last 24 hours) at 01/12/16 0939 Last data filed at 01/11/16 2300  Gross per 24 hour  Intake    603 ml  Output    100 ml  Net    503 ml   Filed Weights   01/10/16 2157 01/11/16 0311 01/12/16 0516  Weight: 170 lb 6.7 oz (77.3 kg) 172 lb 1.6 oz (78.064 kg) 136 lb (61.689 kg)    PHYSICAL EXAM  General: Pleasant, NAD. Neuro: Alert and oriented X 3. Moves all extremities spontaneously. Psych: Normal affect. HEENT:  Normal  Neck: Supple without JVD. Bilateral bruise Lungs:  Resp regular and unlabored, CTA. Heart: RRR no s3, s4, or 2/6 systolic murmur Abdomen: Soft, non-tender, non-distended, BS + x 4.  Extremities: No clubbing, cyanosis or edema. DP/PT/Radials 2+ and equal bilaterally.  Accessory Clinical Findings  CBC  Recent Labs  01/10/16 1545 01/12/16 0517  WBC 5.2 4.0  HGB 11.6*  13.1  HCT 37.0* 40.2  MCV 95.9 93.7  PLT 127* [id number]*   Basic Metabolic Panel  Recent Labs  01/10/16 1545 01/12/16 0517  NA 133* 134*  K 4.1 4.7  CL 95* 95*  CO2 32 26  GLUCOSE 174* 283*  BUN 27* 49*  CREATININE 3.18* 5.42*  CALCIUM 8.0* 9.3   Liver Function Tests No results for input(s): AST, ALT, ALKPHOS, BILITOT, PROT, ALBUMIN in the last 72 hours. No results for input(s): LIPASE, AMYLASE in the last 72 hours. Cardiac Enzymes  Recent Labs  01/10/16 2229 01/11/16 0414 01/11/16 1113  TROPONINI 0.05* 0.05* 0.05*   BNP Invalid input(s): POCBNP D-Dimer  Recent Labs  01/10/16 2229  DDIMER 0.69*   Hemoglobin A1C No results for input(s): HGBA1C in the last 72 hours. Fasting Lipid Panel No results for input(s): CHOL, HDL, LDLCALC, TRIG, CHOLHDL, LDLDIRECT in the last 72 hours. Thyroid Function Tests No results for input(s): TSH, T4TOTAL, T3FREE, THYROIDAB in the last 72 hours.  Invalid input(s): FREET3  TELE  Sinus rhythm   Radiology/Studies  Dg Chest 2 View  01/10/2016  CLINICAL DATA:  82 year old male with history of left-sided chest pain today while at work,  with some shortness of breath and radiation of the pain down into the left arm. EXAM: CHEST  2 VIEW COMPARISON:  Chest x-ray 09/27/2015. FINDINGS: Lung volumes are slightly low. No acute consolidative airspace disease. No pleural effusions. Mild interstitial prominence appears to be chronic. No evidence of pulmonary edema. Heart size is normal. Upper mediastinal contours are within normal limits. Aortic atherosclerosis. Status post median sternotomy for CABG. IMPRESSION: 1. No radiographic evidence of acute cardiopulmonary disease. 2. Aortic atherosclerosis. Electronically Signed   By: Vinnie Langton M.D.   On: 01/10/2016 17:02    ASSESSMENT AND PLAN  1. Chest pain - Troponin minimally elevated however symptoms concerning for angina. Coumadin held. INR 1.34. Continue heparin. For cath later today.   2.  Valvular disease  - Echo 3/17 calcified aortic valve with moderate AS (mean gradient 31 mmHg),  mild AI; severe MAC with mild MS (mean gradient 6 mmHg) and mild MR.   3. PAF - Maintaining sinus rhythm.    Signed, Leanor Kail PA-C Pager [phone]  Patient seen  Agree with findings as noted by B Bhagat above  Plan for cath today  Coumadin is on hold for now. Should have dialysis after.   Dorris Carnes

## 2016-01-12 NOTE — Progress Notes (Signed)
Site area: RFA Site Prior to Removal:  Level 0 Pressure Applied For:20 min Manual:  yes  Patient Status During Pull:  stable Post Pull Site:  Level 0 Post Pull Instructions Given: yes  Post Pull Pulses Present:palpable  Dressing Applied:  tegaderm Bedrest begins @ [id number] till [place] Comments:

## 2016-01-13 DIAGNOSIS — L8990 Pressure ulcer of unspecified site, unspecified stage: Secondary | ICD-10-CM

## 2016-01-13 LAB — CBC
HCT: 37.6 % — ABNORMAL LOW (ref 39.0–52.0)
Hemoglobin: 11.7 g/dL — ABNORMAL LOW (ref 13.0–17.0)
MCH: 29.8 pg (ref 26.0–34.0)
MCHC: 31.1 g/dL (ref 30.0–36.0)
MCV: 95.9 fL (ref 78.0–100.0)
PLATELETS: 112 10*3/uL — AB (ref 150–400)
RBC: 3.92 MIL/uL — AB (ref 4.22–5.81)
RDW: 15.7 % — ABNORMAL HIGH (ref 11.5–15.5)
WBC: 6.1 10*3/uL (ref 4.0–10.5)

## 2016-01-13 LAB — BASIC METABOLIC PANEL
Anion gap: 9 (ref 5–15)
BUN: 37 mg/dL — AB (ref 6–20)
CHLORIDE: 96 mmol/L — AB (ref 101–111)
CO2: 29 mmol/L (ref 22–32)
CREATININE: 3.87 mg/dL — AB (ref 0.61–1.24)
Calcium: 8.8 mg/dL — ABNORMAL LOW (ref 8.9–10.3)
GFR calc Af Amer: 16 mL/min — ABNORMAL LOW (ref >60)
GFR calc non Af Amer: 14 mL/min — ABNORMAL LOW (ref >60)
Glucose, Bld: 273 mg/dL — ABNORMAL HIGH (ref 65–99)
Potassium: 3.9 mmol/L (ref 3.5–5.1)
Sodium: 134 mmol/L — ABNORMAL LOW (ref 135–145)

## 2016-01-13 LAB — GLUCOSE, CAPILLARY
Glucose-Capillary: 255 mg/dL — ABNORMAL HIGH (ref 65–99)
Glucose-Capillary: 243 mg/dL — ABNORMAL HIGH (ref 65–99)

## 2016-01-13 LAB — PROTIME-INR
INR: 1.32 (ref 0.00–1.49)
Prothrombin Time: 16.5 seconds — ABNORMAL HIGH (ref 11.6–15.2)

## 2016-01-13 LAB — HEPARIN LEVEL (UNFRACTIONATED): Heparin Unfractionated: 0.14 IU/mL — ABNORMAL LOW (ref 0.30–0.70)

## 2016-01-13 MED ORDER — INSULIN GLARGINE 100 UNIT/ML ~~LOC~~ SOLN
15.0000 [IU] | Freq: Every day | SUBCUTANEOUS | Status: DC
  Filled 2016-01-13: qty 0.15

## 2016-01-13 MED ORDER — INSULIN ASPART 100 UNIT/ML ~~LOC~~ SOLN: 4.0000 [IU] | Freq: Three times a day (TID) | SUBCUTANEOUS | Status: DC

## 2016-01-13 NOTE — Progress Notes (Signed)
ANTICOAGULATION CONSULT NOTE - Follow Up Consult  Pharmacy Consult for heparin Indication: atrial fibrillation  Labs:  Recent Labs  01/10/16 1545  01/10/16 2229 01/11/16 0414 01/11/16 1113 01/12/16 0517 01/13/16 0533  HGB 11.6*  --   --   --   --  13.1 11.7*  HCT 37.0*  --   --   --   --  40.2 37.6*  PLT 127*  --   --   --   --  124* PENDING  LABPROT  --   < > 24.8* 26.2*  --  16.7* 16.5*  INR  --   < > 2.27* 2.44*  --  1.34 1.32  HEPARINUNFRC  --   --   --   --   --   --  0.14*  CREATININE 3.18*  --   --   --   --  5.42* 3.87*  TROPONINI  --   --  0.05* 0.05* 0.05*  --   --   < > = values in this interval not displayed.   Assessment: 81yo male subtherapeutic on heparin after resuming post-cath.  Goal of Therapy:  Heparin level 0.3-0.7 units/ml   Plan:  Will increase heparin gtt by 3 units/kg/hr to 1100 units/hr and check level in [place], PharmD, BCPS  01/13/2016,6:23 AM

## 2016-01-13 NOTE — Progress Notes (Signed)
Patient ID: [name], male   DOB: 01/27/1934, 82 y.o.   MRN: [id number] S:no complaints O:BP 172/52 mmHg  Pulse 57  Temp(Src) 97.8 F (36.6 C) (Oral)  Resp 20  Ht 5\' 10"  (1.778 m)  Wt 76.386 kg (168 lb 6.4 oz)  BMI 24.16 kg/m2  SpO2 100%  Intake/Output Summary (Last 24 hours) at 01/13/16 1108 Last data filed at 01/13/16 0900  Gross per 24 hour  Intake    480 ml  Output   2300 ml  Net  -1820 ml   Intake/Output: I/O last 3 completed shifts: In: 363 [P.O.:360; I.V.:3] Out: 2400 [Urine:100; Other:2300]  Intake/Output this shift:  Total I/O In: 240 [P.O.:240] Out: -  Weight change: 18.111 kg (39 lb 14.8 oz) Gen:NAD CVS:no rub Resp:cta [id number] KT:[id number] +T/B, no edema   Recent Labs Lab 01/10/16 1545 01/12/16 0517 01/13/16 0533  NA 133* 134* 134*  K 4.1 4.7 3.9  CL 95* 95* 96*  CO2 32 26 29  GLUCOSE 174* 283* 273*  BUN 27* 49* 37*  CREATININE 3.18* 5.42* 3.87*  CALCIUM 8.0* 9.3 8.8*   Liver Function Tests: No results for input(s): AST, ALT, ALKPHOS, BILITOT, PROT, ALBUMIN in the last 168 hours. No results for input(s): LIPASE, AMYLASE in the last 168 hours. No results for input(s): AMMONIA in the last 168 hours. CBC:  Recent Labs Lab 01/10/16 1545 01/12/16 0517 01/13/16 0533  WBC 5.2 4.0 6.1  HGB 11.6* 13.1 11.7*  HCT 37.0* 40.2 37.6*  MCV 95.9 93.7 95.9  PLT 127* 124* 112*   Cardiac Enzymes:  Recent Labs Lab 01/10/16 2229 01/11/16 0414 01/11/16 1113  TROPONINI 0.05* 0.05* 0.05*   CBG:  Recent Labs Lab 01/11/16 2018 01/12/16 0759 01/12/16 1141 01/12/16 2040 01/13/16 0740  GLUCAP 224* 292* 246* 359* 255*    Iron Studies: No results for input(s): IRON, TIBC, TRANSFERRIN, FERRITIN in the last 72 hours. Studies/Results: No results found. Marland Kitchen aspirin  325 mg Oral Daily  . atorvastatin  80 mg Oral Daily  . famotidine  20 mg Oral BID  . insulin aspart  0-9 Units Subcutaneous TID WC  . insulin aspart  4 Units Subcutaneous TID WC  .  insulin glargine  15 Units Subcutaneous Daily  . isosorbide mononitrate  15 mg Oral Daily  . levothyroxine  175 mcg Oral QAC breakfast  . memantine  5 mg Oral Daily  . multivitamin with minerals  1 tablet Oral Daily  . sevelamer carbonate  2.4 g Oral TID WC  . sodium chloride flush  3 mL Intravenous Q12H  . sodium chloride flush  3 mL Intravenous Q12H  . Warfarin - Pharmacist Dosing Inpatient   Does not apply q1800    BMET    Component Value Date/Time   NA 134* 01/13/2016 0533   NA 138 10/12/2015   K 3.9 01/13/2016 0533   CL 96* 01/13/2016 0533   CO2 29 01/13/2016 0533   GLUCOSE 273* 01/13/2016 0533   BUN 37* 01/13/2016 0533   BUN 23* 10/12/2015   CREATININE 3.87* 01/13/2016 0533   CREATININE 3.2* 10/12/2015   CALCIUM 8.8* 01/13/2016 0533   CALCIUM 8.1* 09/22/2015 0930   GFRNONAA 14* 01/13/2016 0533   GFRAA 16* 01/13/2016 0533   CBC    Component Value Date/Time   WBC 6.1 01/13/2016 0533   WBC 6.8 10/01/2015   RBC 3.92* 01/13/2016 0533   HGB 11.7* 01/13/2016 0533   HCT 37.6* 01/13/2016 0533   PLT 112* 01/13/2016 0533  MCV 95.9 01/13/2016 0533   MCH 29.8 01/13/2016 0533   MCHC 31.1 01/13/2016 0533   RDW 15.7* 01/13/2016 0533   LYMPHSABS 0.3* 09/09/2015 1542   MONOABS 0.3 09/09/2015 1542   EOSABS 0.0 09/09/2015 1542   BASOSABS 0.0 09/09/2015 1542     Assessment/Plan: 1. Chest pain: Cardiology consulted. Cardiac cath yesterday with severe native disease as well as some involvement of bypass grafts but only medical management can be offered.  He has surprisingly tolerated HD well. Troponin 0.05.  2. ESRD - MWF [facility]. Will have HD post cardiac cath today. K+4.7 3. Hypertension/volume - No OP antihypertensives on med list. SBP 190s. Last believable wt 78 kg. Attempt 2-2.5 liters as tolerated today.  4. Anemia - HGB 13.1 No ESA. DC weekly venofer.  5. Metabolic bone disease -No calcimimetic/VDRA. Cont. Binders.  6. Nutrition - OP Albumin 3.7. NPO at present.  Renal Diet when able to eat. 7. H/O Paroxysmal Afib: SR at present. coumadin on hold-on heparin gtt per pharmacy 8. Diastolic HF: per primary/Cards. 9. DM: per primary 10. Dementia: per primary 11. Disposition: for discharge home today.  To f/u with his outpatient HD schedule tomorrow.  [place]

## 2016-01-13 NOTE — Care Management Important Message (Signed)
Important Message  Patient Details  Name: [name] MRN: [id number] Date of Birth: [birth year]   Medicare Important Message Given:  Yes    Mariame Rybolt Abena 01/13/2016, 11:02 AM

## 2016-01-13 NOTE — Progress Notes (Addendum)
Subjective: Feeling good  No CP  Breating is OK   Objective: Filed Vitals:   01/12/16 1735 01/12/16 1954 01/13/16 0517 01/13/16 0642  BP: 106/58 106/46 175/61 172/52  Pulse: 64 62 57   Temp: 97.8 F (36.6 C) 99 F (37.2 C) 97.8 F (36.6 C)   TempSrc: Oral Oral Oral   Resp: 20 20 20    Height:      Weight: 171 lb 11.8 oz (77.9 kg)  168 lb 6.4 oz (76.386 kg)   SpO2: 96% 96% 100%    Weight change: 39 lb 14.8 oz (18.111 kg)  Intake/Output Summary (Last 24 hours) at 01/13/16 0920 Last data filed at 01/12/16 1900  Gross per 24 hour  Intake    240 ml  Output   2300 ml  Net  -2060 ml    General: Alert, awake, oriented x3, in no acute distress Neck:  JVP is normal Heart: Regular rate and rhythm, without murmurs, rubs, gallops.  Lungs: Clear to auscultation.  No rales or wheezes. Exemities:  No edema.   Neuro: Grossly intact, nonfocal.   Lab Results: Results for orders placed or performed during the hospital encounter of 01/10/16 (from the past 24 hour(s))  Glucose, capillary     Status: Abnormal   Collection Time: 01/12/16 11:41 AM  Result Value Ref Range   Glucose-Capillary 246 (H) 65 - 99 mg/dL  Glucose, capillary     Status: Abnormal   Collection Time: 01/12/16  8:40 PM  Result Value Ref Range   Glucose-Capillary 359 (H) 65 - 99 mg/dL  Protime-INR     Status: Abnormal   Collection Time: 01/13/16  5:33 AM  Result Value Ref Range   Prothrombin Time 16.5 (H) 11.6 - 15.2 seconds   INR 1.32 0.00 - [id number]  Basic metabolic panel     Status: Abnormal   Collection Time: 01/13/16  5:33 AM  Result Value Ref Range   Sodium 134 (L) 135 - 145 mmol/L   Potassium 3.9 3.5 - 5.1 mmol/L   Chloride 96 (L) 101 - 111 mmol/L   CO2 29 22 - 32 mmol/L   Glucose, Bld 273 (H) 65 - 99 mg/dL   BUN 37 (H) 6 - 20 mg/dL   Creatinine, Ser 3.87 (H) 0.61 - 1.24 mg/dL   Calcium 8.8 (L) 8.9 - 10.3 mg/dL   GFR calc non Af Amer 14 (L) >60 mL/min   GFR calc Af Amer 16 (L) >60 mL/min   Anion gap 9 5  - 15  CBC     Status: Abnormal   Collection Time: 01/13/16  5:33 AM  Result Value Ref Range   WBC 6.1 4.0 - 10.5 K/uL   RBC 3.92 (L) 4.22 - 5.81 MIL/uL   Hemoglobin 11.7 (L) 13.0 - 17.0 g/dL   HCT 37.6 (L) 39.0 - 52.0 %   MCV 95.9 78.0 - 100.0 fL   MCH 29.8 26.0 - 34.0 pg   MCHC 31.1 30.0 - 36.0 g/dL   RDW 15.7 (H) 11.5 - 15.5 %   Platelets 112 (L) 150 - 400 K/uL  Heparin level (unfractionated)     Status: Abnormal   Collection Time: 01/13/16  5:33 AM  Result Value Ref Range   Heparin Unfractionated 0.14 (L) 0.30 - 0.70 IU/mL    Studies/Results: No results found.  Medications: REviewed    @PROBHOSP @ 1  CAD  Cath yeserday with progression of dz   Bypass graft failure with occlusion of the SVG to the diagonal.  There is also failure of the SVG to the obtuse marginal.  Widely patent SVG to the distal right coronary.  Widely patent LIMA to the mid to distal LAD.  Severe native vessel coronary disease with total occlusion of the distal RCA, total occlusion of the proximal to mid LAD, and total occlusion of the circumflex. The first diagonal is heavily calcified, moderate in size, and contains segmental 95% stenosis within a tortuous segment. The diagonal territory is threatened.  No significant gradient across the aortic valve. Left ventricular end-diastolic pressure is elevated greater than 25 mmHg. Plan for medical Rx    2  Valvular dz  Cath sugg no signif gradient across valve  Fill follow    3  PAF  Switch to coumadin  Stop heparin  Can be followed as outpt   I do not think he needs bridging    Has appt with P Nahser on Aug 17 at 9:30   Home health will follow coumadin     LOS: 2 days   Dorris Carnes 01/13/2016, 9:20 AM

## 2016-01-13 NOTE — Discharge Summary (Signed)
Physician Discharge Summary   Patient ID: [name] MRN: [id number] DOB/AGE: [birth year] 82 y.o.  Admit date: 01/10/2016 Discharge date: 01/13/2016  Primary Care Physician:  Donnajean Lopes, MD  Discharge Diagnoses:    . Chest pain . Diabetes mellitus with peripheral vascular disease ([place]) . Chronic combined systolic and diastolic CHF (congestive heart failure) ([place]) . CAD (coronary artery disease) . Atrial fibrillation ([place])   ESRD on hemodialysis MWF  Consults:  Cardiology Nephrology  Recommendations for Outpatient Follow-up:  1. *Home health PT to be arranged by the case management 2. Please repeat CBC/BMET at next visit   DIET: Renal diet    Allergies:   Allergies  Allergen Reactions  . Betadine [Povidone Iodine] Rash  . Iodinated Diagnostic Agents Rash and Other (See Comments)    Does fine with premedications for cath  . Latex Hives  . Tape Rash    MUST BE FREE OF ANY LATEX     DISCHARGE MEDICATIONS: Current Discharge Medication List    START taking these medications   Details  nitroGLYCERIN (NITROSTAT) 0.4 MG SL tablet Place 1 tablet (0.4 mg total) under the tongue every 5 (five) minutes as needed for chest pain. Qty: 30 tablet, Refills: 12      CONTINUE these medications which have NOT CHANGED   Details  acetaminophen (TYLENOL) 325 MG tablet Take 2 tablets (650 mg total) by mouth every 4 (four) hours as needed for headache or mild pain.   Associated Diagnoses: Acute on chronic systolic congestive heart failure (HCC)    atorvastatin (LIPITOR) 80 MG tablet TAKE ONE TABLET BY MOUTH ONCE DAILY Qty: 90 tablet, Refills: 3    cholecalciferol (VITAMIN D) 1000 UNITS tablet Take 1,000 Units by mouth daily at 6 PM. D3    insulin aspart (NOVOLOG FLEXPEN) 100 UNIT/ML FlexPen Inject 8 Units into the skin 2 (two) times daily before a meal.    Insulin Glargine (LANTUS SOLOSTAR) 100 UNIT/ML SOPN Inject 24 Units into the skin every morning.     isosorbide  mononitrate (IMDUR) 30 MG 24 hr tablet Take 15 mg by mouth daily.     levothyroxine (SYNTHROID, LEVOTHROID) 175 MCG tablet Take 175 mcg by mouth daily before breakfast.    memantine (NAMENDA) 5 MG tablet Take 5 mg by mouth daily.     Multiple Vitamins-Minerals (DECUBI-VITE) CAPS Take 1 capsule by mouth daily.    OXYGEN Inhale 4 L/min into the lungs continuous. To keep O2 sats >90    ranitidine (ZANTAC) 300 MG tablet Take 300 mg by mouth daily.    SANTYL ointment Apply topically to affected foot    sevelamer carbonate (RENVELA) 2.4 g PACK Take 2.4 g by mouth 3 (three) times daily. Qty: 90 each    warfarin (COUMADIN) 5 MG tablet TAKE AS DIRECTED Qty: 90 tablet, Refills: 0    albuterol (PROVENTIL) (2.5 MG/3ML) 0.083% nebulizer solution Take 3 mLs (2.5 mg total) by nebulization every 6 (six) hours as needed for wheezing or shortness of breath. Qty: 75 mL, Refills: 12    diphenhydrAMINE (BENADRYL) 25 mg capsule Take 50 mg by mouth See admin instructions. To be taken as a one-time dose for procedure on 01/13/16    pantoprazole (PROTONIX) 40 MG tablet Take 1 tablet (40 mg total) by mouth daily at 6 (six) AM.   Associated Diagnoses: Acute on chronic systolic congestive heart failure (HCC)      STOP taking these medications     guaifenesin (ROBITUSSIN) 100 MG/5ML syrup  predniSONE (DELTASONE) 20 MG tablet          Brief H and P: For complete details please refer to admission H and P, but in brief  [name] is a 82 y.o. male with ESRD on hemodialysis since March 2017, CAD status post CABG, atrial fibrillation on Coumadin, hypertension and diastolic CHF with moderate aortic stenosis started developing chest pressure with shortness of breath while at dialysis. Patient was transferred to hospital and en route was given sublingual nitroglycerin following which patient's symptoms improved and resolved. EKG was showing nonspecific changes and troponin was negative. Patient is  presently chest pain-free and admitted for further management. Patient did not have any fever chills or productive cough.  Hospital Course:  Chest pain-Concerning for angina,Troponin 0.05 -Cardiology was consulted, patient underwent cardiac cath  - Cardiac cath showed widely patent SVG to distal right coronary, white [place] patent LIMA to mid to distal LAD, severe native vessel coronary artery disease with total occlusion of distal RCA, total occlusion of proximal to mid LAD and total occlusion of circumflex, recommended medical therapy.  Atrial fibrillation -CHADSVASC 5 (age, DM, CHF, vascular) -Restarted Coumadin   ESRD -Patient dialyzes Monday, Wednesday, Friday. - Nephrology was consulted, patient underwent hemodialysis after the cardiac cath, he will follow with his outpatient hemodialysis schedule tomorrow. y  Chronic diastolic heart failure -2-D echo 3/17 showed moderate AS, mild AI, mild MR -Fluid management with HD  Diabetes mellitus, type II -Continue Lantus, and sliding scale  Chronic anemia H&H stable  Dementia -On Namenda  Hypothyroidism -Continue Synthroid  Chronic left heel ulcer -Wound care was Consulted   Day of Discharge BP 172/52 mmHg  Pulse 57  Temp(Src) 97.8 F (36.6 C) (Oral)  Resp 20  Ht 5\' 10"  (1.778 m)  Wt 76.386 kg (168 lb 6.4 oz)  BMI 24.16 kg/m2  SpO2 100%  Physical Exam: General: Alert and awake oriented x3 not in any acute distress. HEENT: anicteric sclera, pupils reactive to light and accommodation CVS: S1-S2 clear no murmur rubs or gallops Chest: clear to auscultation bilaterally, no wheezing rales or rhonchi Abdomen: soft nontender, nondistended, normal bowel sounds Extremities: no cyanosis, clubbing or edema noted bilaterally Neuro: Cranial nerves II-XII intact, no focal neurological deficits   The results of significant diagnostics from this hospitalization (including imaging, microbiology, ancillary and laboratory) are listed  below for reference.    LAB RESULTS: Basic Metabolic Panel:  Recent Labs Lab 01/12/16 0517 01/13/16 0533  NA 134* 134*  K 4.7 3.9  CL 95* 96*  CO2 26 29  GLUCOSE 283* 273*  BUN 49* 37*  CREATININE 5.42* 3.87*  CALCIUM 9.3 8.8*   Liver Function Tests: No results for input(s): AST, ALT, ALKPHOS, BILITOT, PROT, ALBUMIN in the last 168 hours. No results for input(s): LIPASE, AMYLASE in the last 168 hours. No results for input(s): AMMONIA in the last 168 hours. CBC:  Recent Labs Lab 01/12/16 0517 01/13/16 0533  WBC 4.0 6.1  HGB 13.1 11.7*  HCT 40.2 37.6*  MCV 93.7 95.9  PLT 124* 112*   Cardiac Enzymes:  Recent Labs Lab 01/11/16 0414 01/11/16 1113  TROPONINI 0.05* 0.05*   BNP: Invalid input(s): POCBNP CBG:  Recent Labs Lab 01/12/16 2040 01/13/16 0740  GLUCAP 359* 255*    Significant Diagnostic Studies:  Dg Chest 2 View  01/10/2016  CLINICAL DATA:  82 year old male with history of left-sided chest pain today while at work, with some shortness of breath and radiation of the pain down  into the left arm. EXAM: CHEST  2 VIEW COMPARISON:  Chest x-ray 09/27/2015. FINDINGS: Lung volumes are slightly low. No acute consolidative airspace disease. No pleural effusions. Mild interstitial prominence appears to be chronic. No evidence of pulmonary edema. Heart size is normal. Upper mediastinal contours are within normal limits. Aortic atherosclerosis. Status post median sternotomy for CABG. IMPRESSION: 1. No radiographic evidence of acute cardiopulmonary disease. 2. Aortic atherosclerosis. Electronically Signed   By: Vinnie Langton M.D.   On: 01/10/2016 17:02    Left heart cardiac catheterization   Bypass graft failure with occlusion of the SVG to the diagonal. There is also failure of the SVG to the obtuse marginal.  Widely patent SVG to the distal right coronary.  Widely patent LIMA to the mid to distal LAD.  Severe native vessel coronary disease with total  occlusion of the distal RCA, total occlusion of the proximal to mid LAD, and total occlusion of the circumflex. The first diagonal is heavily calcified, moderate in size, and contains segmental 95% stenosis within a tortuous segment. The diagonal territory is threatened.  No significant gradient across the aortic valve. Left ventricular end-diastolic pressure is elevated greater than 25 mmHg.  RECOMMENDATIONS:   Medical therapy.  Disposition and Follow-up: Discharge Instructions    Diet Carb Modified    Complete by:  As directed      Increase activity slowly    Complete by:  As directed             DISPOSITION:Home   DISCHARGE FOLLOW-UP Follow-up Information    Follow up with [place].   Why:  Registered Nurse   Contact information:   [address] [place] [address] [phone]       Follow up with Donnajean Lopes, MD. Schedule an appointment as soon as possible for a visit in 2 weeks.   Specialty:  Internal Medicine   Why:  for hospital follow-up   Contact information:   [place]  [phone]       Follow up with Belva Crome III, MD. Schedule an appointment as soon as possible for a visit in 2 weeks.   Specialty:  Cardiology   Why:  for hospital follow-up   Contact information:   [address] [place] [address] [place] [address] [phone]        Time spent on Discharge: 37 minutes  Signed:   Antonious Omahoney M.D. [facility] 01/13/2016, 11:39 AM Pager: [phone]

## 2016-01-14 DIAGNOSIS — E1129 Type 2 diabetes mellitus with other diabetic kidney complication: Secondary | ICD-10-CM | POA: Diagnosis not present

## 2016-01-14 DIAGNOSIS — D509 Iron deficiency anemia, unspecified: Secondary | ICD-10-CM | POA: Diagnosis not present

## 2016-01-14 DIAGNOSIS — N186 End stage renal disease: Secondary | ICD-10-CM | POA: Diagnosis not present

## 2016-01-15 DIAGNOSIS — N186 End stage renal disease: Secondary | ICD-10-CM | POA: Diagnosis not present

## 2016-01-15 DIAGNOSIS — L89620 Pressure ulcer of left heel, unstageable: Secondary | ICD-10-CM | POA: Diagnosis not present

## 2016-01-15 DIAGNOSIS — I129 Hypertensive chronic kidney disease with stage 1 through stage 4 chronic kidney disease, or unspecified chronic kidney disease: Secondary | ICD-10-CM | POA: Diagnosis not present

## 2016-01-15 DIAGNOSIS — E1122 Type 2 diabetes mellitus with diabetic chronic kidney disease: Secondary | ICD-10-CM | POA: Diagnosis not present

## 2016-01-15 DIAGNOSIS — I2510 Atherosclerotic heart disease of native coronary artery without angina pectoris: Secondary | ICD-10-CM | POA: Diagnosis not present

## 2016-01-15 DIAGNOSIS — I5042 Chronic combined systolic (congestive) and diastolic (congestive) heart failure: Secondary | ICD-10-CM | POA: Diagnosis not present

## 2016-01-17 DIAGNOSIS — N186 End stage renal disease: Secondary | ICD-10-CM | POA: Diagnosis not present

## 2016-01-17 DIAGNOSIS — D509 Iron deficiency anemia, unspecified: Secondary | ICD-10-CM | POA: Diagnosis not present

## 2016-01-17 DIAGNOSIS — E1129 Type 2 diabetes mellitus with other diabetic kidney complication: Secondary | ICD-10-CM | POA: Diagnosis not present

## 2016-01-19 DIAGNOSIS — E1129 Type 2 diabetes mellitus with other diabetic kidney complication: Secondary | ICD-10-CM | POA: Diagnosis not present

## 2016-01-19 DIAGNOSIS — N186 End stage renal disease: Secondary | ICD-10-CM | POA: Diagnosis not present

## 2016-01-19 DIAGNOSIS — D509 Iron deficiency anemia, unspecified: Secondary | ICD-10-CM | POA: Diagnosis not present

## 2016-01-20 ENCOUNTER — Ambulatory Visit (INDEPENDENT_AMBULATORY_CARE_PROVIDER_SITE_OTHER): Payer: Medicare Other | Admitting: Internal Medicine

## 2016-01-20 DIAGNOSIS — N185 Chronic kidney disease, stage 5: Secondary | ICD-10-CM | POA: Diagnosis not present

## 2016-01-20 DIAGNOSIS — I10 Essential (primary) hypertension: Secondary | ICD-10-CM | POA: Diagnosis not present

## 2016-01-20 DIAGNOSIS — Z6826 Body mass index (BMI) 26.0-26.9, adult: Secondary | ICD-10-CM | POA: Diagnosis not present

## 2016-01-20 DIAGNOSIS — I480 Paroxysmal atrial fibrillation: Secondary | ICD-10-CM

## 2016-01-20 DIAGNOSIS — Z5181 Encounter for therapeutic drug level monitoring: Secondary | ICD-10-CM

## 2016-01-20 DIAGNOSIS — E11621 Type 2 diabetes mellitus with foot ulcer: Secondary | ICD-10-CM | POA: Diagnosis not present

## 2016-01-20 DIAGNOSIS — E1151 Type 2 diabetes mellitus with diabetic peripheral angiopathy without gangrene: Secondary | ICD-10-CM | POA: Diagnosis not present

## 2016-01-20 DIAGNOSIS — E1165 Type 2 diabetes mellitus with hyperglycemia: Secondary | ICD-10-CM | POA: Diagnosis not present

## 2016-01-20 LAB — POCT INR: INR: 1.7

## 2016-01-21 DIAGNOSIS — D509 Iron deficiency anemia, unspecified: Secondary | ICD-10-CM | POA: Diagnosis not present

## 2016-01-21 DIAGNOSIS — N186 End stage renal disease: Secondary | ICD-10-CM | POA: Diagnosis not present

## 2016-01-21 DIAGNOSIS — E1129 Type 2 diabetes mellitus with other diabetic kidney complication: Secondary | ICD-10-CM | POA: Diagnosis not present

## 2016-01-24 DIAGNOSIS — N186 End stage renal disease: Secondary | ICD-10-CM | POA: Diagnosis not present

## 2016-01-24 DIAGNOSIS — D509 Iron deficiency anemia, unspecified: Secondary | ICD-10-CM | POA: Diagnosis not present

## 2016-01-24 DIAGNOSIS — E1129 Type 2 diabetes mellitus with other diabetic kidney complication: Secondary | ICD-10-CM | POA: Diagnosis not present

## 2016-01-24 DIAGNOSIS — E1122 Type 2 diabetes mellitus with diabetic chronic kidney disease: Secondary | ICD-10-CM | POA: Diagnosis not present

## 2016-01-24 DIAGNOSIS — Z992 Dependence on renal dialysis: Secondary | ICD-10-CM | POA: Diagnosis not present

## 2016-01-25 DIAGNOSIS — E1122 Type 2 diabetes mellitus with diabetic chronic kidney disease: Secondary | ICD-10-CM | POA: Diagnosis not present

## 2016-01-25 DIAGNOSIS — Z992 Dependence on renal dialysis: Secondary | ICD-10-CM | POA: Diagnosis not present

## 2016-01-25 DIAGNOSIS — N184 Chronic kidney disease, stage 4 (severe): Secondary | ICD-10-CM | POA: Diagnosis not present

## 2016-01-25 DIAGNOSIS — E039 Hypothyroidism, unspecified: Secondary | ICD-10-CM | POA: Diagnosis not present

## 2016-01-25 DIAGNOSIS — I5042 Chronic combined systolic (congestive) and diastolic (congestive) heart failure: Secondary | ICD-10-CM | POA: Diagnosis not present

## 2016-01-25 DIAGNOSIS — N186 End stage renal disease: Secondary | ICD-10-CM | POA: Diagnosis not present

## 2016-01-25 DIAGNOSIS — I2510 Atherosclerotic heart disease of native coronary artery without angina pectoris: Secondary | ICD-10-CM | POA: Diagnosis not present

## 2016-01-25 DIAGNOSIS — I132 Hypertensive heart and chronic kidney disease with heart failure and with stage 5 chronic kidney disease, or end stage renal disease: Secondary | ICD-10-CM | POA: Diagnosis not present

## 2016-01-25 DIAGNOSIS — Z951 Presence of aortocoronary bypass graft: Secondary | ICD-10-CM | POA: Diagnosis not present

## 2016-01-25 DIAGNOSIS — R1310 Dysphagia, unspecified: Secondary | ICD-10-CM | POA: Diagnosis not present

## 2016-01-25 DIAGNOSIS — L89620 Pressure ulcer of left heel, unstageable: Secondary | ICD-10-CM | POA: Diagnosis not present

## 2016-01-25 DIAGNOSIS — F0390 Unspecified dementia without behavioral disturbance: Secondary | ICD-10-CM | POA: Diagnosis not present

## 2016-01-25 DIAGNOSIS — E43 Unspecified severe protein-calorie malnutrition: Secondary | ICD-10-CM | POA: Diagnosis not present

## 2016-01-25 DIAGNOSIS — I4891 Unspecified atrial fibrillation: Secondary | ICD-10-CM | POA: Diagnosis not present

## 2016-01-25 DIAGNOSIS — E1151 Type 2 diabetes mellitus with diabetic peripheral angiopathy without gangrene: Secondary | ICD-10-CM | POA: Diagnosis not present

## 2016-01-25 DIAGNOSIS — Z794 Long term (current) use of insulin: Secondary | ICD-10-CM | POA: Diagnosis not present

## 2016-01-26 DIAGNOSIS — N2581 Secondary hyperparathyroidism of renal origin: Secondary | ICD-10-CM | POA: Diagnosis not present

## 2016-01-26 DIAGNOSIS — N186 End stage renal disease: Secondary | ICD-10-CM | POA: Diagnosis not present

## 2016-01-26 DIAGNOSIS — D509 Iron deficiency anemia, unspecified: Secondary | ICD-10-CM | POA: Diagnosis not present

## 2016-01-26 DIAGNOSIS — E1129 Type 2 diabetes mellitus with other diabetic kidney complication: Secondary | ICD-10-CM | POA: Diagnosis not present

## 2016-01-27 ENCOUNTER — Ambulatory Visit (INDEPENDENT_AMBULATORY_CARE_PROVIDER_SITE_OTHER): Payer: Medicare Other

## 2016-01-27 DIAGNOSIS — I871 Compression of vein: Secondary | ICD-10-CM | POA: Diagnosis not present

## 2016-01-27 DIAGNOSIS — E1122 Type 2 diabetes mellitus with diabetic chronic kidney disease: Secondary | ICD-10-CM | POA: Diagnosis not present

## 2016-01-27 DIAGNOSIS — Z5181 Encounter for therapeutic drug level monitoring: Secondary | ICD-10-CM

## 2016-01-27 DIAGNOSIS — T82858D Stenosis of vascular prosthetic devices, implants and grafts, subsequent encounter: Secondary | ICD-10-CM | POA: Diagnosis not present

## 2016-01-27 DIAGNOSIS — I2510 Atherosclerotic heart disease of native coronary artery without angina pectoris: Secondary | ICD-10-CM | POA: Diagnosis not present

## 2016-01-27 DIAGNOSIS — L89620 Pressure ulcer of left heel, unstageable: Secondary | ICD-10-CM | POA: Diagnosis not present

## 2016-01-27 DIAGNOSIS — I132 Hypertensive heart and chronic kidney disease with heart failure and with stage 5 chronic kidney disease, or end stage renal disease: Secondary | ICD-10-CM | POA: Diagnosis not present

## 2016-01-27 DIAGNOSIS — I480 Paroxysmal atrial fibrillation: Secondary | ICD-10-CM

## 2016-01-27 DIAGNOSIS — I5042 Chronic combined systolic (congestive) and diastolic (congestive) heart failure: Secondary | ICD-10-CM | POA: Diagnosis not present

## 2016-01-27 DIAGNOSIS — Z992 Dependence on renal dialysis: Secondary | ICD-10-CM | POA: Diagnosis not present

## 2016-01-27 DIAGNOSIS — N186 End stage renal disease: Secondary | ICD-10-CM | POA: Diagnosis not present

## 2016-01-27 LAB — POCT INR: INR: 1.9

## 2016-01-28 DIAGNOSIS — N186 End stage renal disease: Secondary | ICD-10-CM | POA: Diagnosis not present

## 2016-01-28 DIAGNOSIS — N2581 Secondary hyperparathyroidism of renal origin: Secondary | ICD-10-CM | POA: Diagnosis not present

## 2016-01-28 DIAGNOSIS — D509 Iron deficiency anemia, unspecified: Secondary | ICD-10-CM | POA: Diagnosis not present

## 2016-01-28 DIAGNOSIS — E1129 Type 2 diabetes mellitus with other diabetic kidney complication: Secondary | ICD-10-CM | POA: Diagnosis not present

## 2016-01-31 DIAGNOSIS — N2581 Secondary hyperparathyroidism of renal origin: Secondary | ICD-10-CM | POA: Diagnosis not present

## 2016-01-31 DIAGNOSIS — I132 Hypertensive heart and chronic kidney disease with heart failure and with stage 5 chronic kidney disease, or end stage renal disease: Secondary | ICD-10-CM | POA: Diagnosis not present

## 2016-01-31 DIAGNOSIS — I5042 Chronic combined systolic (congestive) and diastolic (congestive) heart failure: Secondary | ICD-10-CM | POA: Diagnosis not present

## 2016-01-31 DIAGNOSIS — I2510 Atherosclerotic heart disease of native coronary artery without angina pectoris: Secondary | ICD-10-CM | POA: Diagnosis not present

## 2016-01-31 DIAGNOSIS — E1122 Type 2 diabetes mellitus with diabetic chronic kidney disease: Secondary | ICD-10-CM | POA: Diagnosis not present

## 2016-01-31 DIAGNOSIS — L89620 Pressure ulcer of left heel, unstageable: Secondary | ICD-10-CM | POA: Diagnosis not present

## 2016-01-31 DIAGNOSIS — E1129 Type 2 diabetes mellitus with other diabetic kidney complication: Secondary | ICD-10-CM | POA: Diagnosis not present

## 2016-01-31 DIAGNOSIS — D509 Iron deficiency anemia, unspecified: Secondary | ICD-10-CM | POA: Diagnosis not present

## 2016-01-31 DIAGNOSIS — N186 End stage renal disease: Secondary | ICD-10-CM | POA: Diagnosis not present

## 2016-02-02 DIAGNOSIS — E1129 Type 2 diabetes mellitus with other diabetic kidney complication: Secondary | ICD-10-CM | POA: Diagnosis not present

## 2016-02-02 DIAGNOSIS — D509 Iron deficiency anemia, unspecified: Secondary | ICD-10-CM | POA: Diagnosis not present

## 2016-02-02 DIAGNOSIS — N2581 Secondary hyperparathyroidism of renal origin: Secondary | ICD-10-CM | POA: Diagnosis not present

## 2016-02-02 DIAGNOSIS — N186 End stage renal disease: Secondary | ICD-10-CM | POA: Diagnosis not present

## 2016-02-03 ENCOUNTER — Ambulatory Visit (INDEPENDENT_AMBULATORY_CARE_PROVIDER_SITE_OTHER): Payer: Medicare Other | Admitting: Cardiology

## 2016-02-03 DIAGNOSIS — I132 Hypertensive heart and chronic kidney disease with heart failure and with stage 5 chronic kidney disease, or end stage renal disease: Secondary | ICD-10-CM | POA: Diagnosis not present

## 2016-02-03 DIAGNOSIS — L89620 Pressure ulcer of left heel, unstageable: Secondary | ICD-10-CM | POA: Diagnosis not present

## 2016-02-03 DIAGNOSIS — I480 Paroxysmal atrial fibrillation: Secondary | ICD-10-CM

## 2016-02-03 DIAGNOSIS — I5042 Chronic combined systolic (congestive) and diastolic (congestive) heart failure: Secondary | ICD-10-CM | POA: Diagnosis not present

## 2016-02-03 DIAGNOSIS — N186 End stage renal disease: Secondary | ICD-10-CM | POA: Diagnosis not present

## 2016-02-03 DIAGNOSIS — E1122 Type 2 diabetes mellitus with diabetic chronic kidney disease: Secondary | ICD-10-CM | POA: Diagnosis not present

## 2016-02-03 DIAGNOSIS — I2510 Atherosclerotic heart disease of native coronary artery without angina pectoris: Secondary | ICD-10-CM | POA: Diagnosis not present

## 2016-02-03 DIAGNOSIS — Z5181 Encounter for therapeutic drug level monitoring: Secondary | ICD-10-CM

## 2016-02-03 LAB — POCT INR: INR: 2.3

## 2016-02-04 DIAGNOSIS — E1129 Type 2 diabetes mellitus with other diabetic kidney complication: Secondary | ICD-10-CM | POA: Diagnosis not present

## 2016-02-04 DIAGNOSIS — D509 Iron deficiency anemia, unspecified: Secondary | ICD-10-CM | POA: Diagnosis not present

## 2016-02-04 DIAGNOSIS — N186 End stage renal disease: Secondary | ICD-10-CM | POA: Diagnosis not present

## 2016-02-04 DIAGNOSIS — N2581 Secondary hyperparathyroidism of renal origin: Secondary | ICD-10-CM | POA: Diagnosis not present

## 2016-02-07 DIAGNOSIS — N2581 Secondary hyperparathyroidism of renal origin: Secondary | ICD-10-CM | POA: Diagnosis not present

## 2016-02-07 DIAGNOSIS — D509 Iron deficiency anemia, unspecified: Secondary | ICD-10-CM | POA: Diagnosis not present

## 2016-02-07 DIAGNOSIS — N186 End stage renal disease: Secondary | ICD-10-CM | POA: Diagnosis not present

## 2016-02-07 DIAGNOSIS — E1129 Type 2 diabetes mellitus with other diabetic kidney complication: Secondary | ICD-10-CM | POA: Diagnosis not present

## 2016-02-08 ENCOUNTER — Encounter: Payer: Self-pay | Admitting: Podiatry

## 2016-02-08 ENCOUNTER — Ambulatory Visit (INDEPENDENT_AMBULATORY_CARE_PROVIDER_SITE_OTHER): Payer: Medicare Other | Admitting: Podiatry

## 2016-02-08 DIAGNOSIS — M79676 Pain in unspecified toe(s): Secondary | ICD-10-CM | POA: Diagnosis not present

## 2016-02-08 DIAGNOSIS — B351 Tinea unguium: Secondary | ICD-10-CM | POA: Diagnosis not present

## 2016-02-08 DIAGNOSIS — E1151 Type 2 diabetes mellitus with diabetic peripheral angiopathy without gangrene: Secondary | ICD-10-CM

## 2016-02-08 NOTE — Progress Notes (Signed)
Patient ID: [name], male   DOB: [birth year], 82 y.o.   MRN: [id number]    Subjective: This patient presents for scheduled visit complaining of elongated and thickened toenails which are comfortable walking wearing shoes and requests toenail debridement. Patient also states since his last visit of 03/24/2015 he had fractured his right foot and is currently being treated by orthopedic Dr. and wearing a boot on his right leg and foot and says he has a follow-up examination was ordered orthopedic doctor on 07/04/2015  Objective: Wife present in treatment room edema right lower extremity without any open lesions The toenails are elongated, brittle, hypertrophic, deformed and tender to direct palpation 6-10  Assessment: Symptomatic onychomycoses 6-10 Diabetic with peripheral arterial disease Foot fracture per patient under treatment with orthopedic doctor  Plan: Debridement of toenails 6-10 mechanically and electrically without any bleeding  Reappoint 3 months

## 2016-02-08 NOTE — Patient Instructions (Signed)
Diabetes and Foot Care Diabetes may cause you to have problems because of poor blood supply (circulation) to your feet and legs. This may cause the skin on your feet to become thinner, break easier, and heal more slowly. Your skin may become dry, and the skin may peel and crack. You may also have nerve damage in your legs and feet causing decreased feeling in them. You may not notice minor injuries to your feet that could lead to infections or more serious problems. Taking care of your feet is one of the most important things you can do for yourself.  HOME CARE INSTRUCTIONS  Wear shoes at all times, even in the house. Do not go barefoot. Bare feet are easily injured.  Check your feet daily for blisters, cuts, and redness. If you cannot see the bottom of your feet, use a mirror or ask someone for help.  Wash your feet with warm water (do not use hot water) and mild soap. Then pat your feet and the areas between your toes until they are completely dry. Do not soak your feet as this can dry your skin.  Apply a moisturizing lotion or petroleum jelly (that does not contain alcohol and is unscented) to the skin on your feet and to dry, brittle toenails. Do not apply lotion between your toes.  Trim your toenails straight across. Do not dig under them or around the cuticle. File the edges of your nails with an emery board or nail file.  Do not cut corns or calluses or try to remove them with medicine.  Wear clean socks or stockings every day. Make sure they are not too tight. Do not wear knee-high stockings since they may decrease blood flow to your legs.  Wear shoes that fit properly and have enough cushioning. To break in new shoes, wear them for just a few hours a day. This prevents you from injuring your feet. Always look in your shoes before you put them on to be sure there are no objects inside.  Do not cross your legs. This may decrease the blood flow to your feet.  If you find a minor scrape,  cut, or break in the skin on your feet, keep it and the skin around it clean and dry. These areas may be cleansed with mild soap and water. Do not cleanse the area with peroxide, alcohol, or iodine.  When you remove an adhesive bandage, be sure not to damage the skin around it.  If you have a wound, look at it several times a day to make sure it is healing.  Do not use heating pads or hot water bottles. They may burn your skin. If you have lost feeling in your feet or legs, you may not know it is happening until it is too late.  Make sure your health care provider performs a complete foot exam at least annually or more often if you have foot problems. Report any cuts, sores, or bruises to your health care provider immediately. SEEK MEDICAL CARE IF:   You have an injury that is not healing.  You have cuts or breaks in the skin.  You have an ingrown nail.  You notice redness on your legs or feet.  You feel burning or tingling in your legs or feet.  You have pain or cramps in your legs and feet.  Your legs or feet are numb.  Your feet always feel cold. SEEK IMMEDIATE MEDICAL CARE IF:   There is increasing redness,   swelling, or pain in or around a wound.  There is a red line that goes up your leg.  Pus is coming from a wound.  You develop a fever or as directed by your health care provider.  You notice a bad smell coming from an ulcer or wound.   This information is not intended to replace advice given to you by your health care provider. Make sure you discuss any questions you have with your health care provider.   Document Released: 06/09/2000 Document Revised: 02/12/2013 Document Reviewed: 11/19/2012 Elsevier Interactive Patient Education 2016 [employer]  

## 2016-02-09 ENCOUNTER — Ambulatory Visit: Payer: Medicare Other | Admitting: Cardiovascular Disease

## 2016-02-09 DIAGNOSIS — D509 Iron deficiency anemia, unspecified: Secondary | ICD-10-CM | POA: Diagnosis not present

## 2016-02-09 DIAGNOSIS — E1129 Type 2 diabetes mellitus with other diabetic kidney complication: Secondary | ICD-10-CM | POA: Diagnosis not present

## 2016-02-09 DIAGNOSIS — N2581 Secondary hyperparathyroidism of renal origin: Secondary | ICD-10-CM | POA: Diagnosis not present

## 2016-02-09 DIAGNOSIS — N186 End stage renal disease: Secondary | ICD-10-CM | POA: Diagnosis not present

## 2016-02-10 ENCOUNTER — Ambulatory Visit (INDEPENDENT_AMBULATORY_CARE_PROVIDER_SITE_OTHER): Payer: Medicare Other | Admitting: Pharmacist

## 2016-02-10 ENCOUNTER — Ambulatory Visit (INDEPENDENT_AMBULATORY_CARE_PROVIDER_SITE_OTHER): Payer: Medicare Other | Admitting: Cardiovascular Disease

## 2016-02-10 ENCOUNTER — Encounter: Payer: Self-pay | Admitting: Cardiovascular Disease

## 2016-02-10 VITALS — BP 72/30 | HR 58 | Ht 70.0 in | Wt 173.0 lb

## 2016-02-10 DIAGNOSIS — E860 Dehydration: Secondary | ICD-10-CM

## 2016-02-10 DIAGNOSIS — I5042 Chronic combined systolic (congestive) and diastolic (congestive) heart failure: Secondary | ICD-10-CM | POA: Diagnosis not present

## 2016-02-10 DIAGNOSIS — I779 Disorder of arteries and arterioles, unspecified: Secondary | ICD-10-CM

## 2016-02-10 DIAGNOSIS — I25118 Atherosclerotic heart disease of native coronary artery with other forms of angina pectoris: Secondary | ICD-10-CM

## 2016-02-10 DIAGNOSIS — I481 Persistent atrial fibrillation: Secondary | ICD-10-CM | POA: Diagnosis not present

## 2016-02-10 DIAGNOSIS — I2510 Atherosclerotic heart disease of native coronary artery without angina pectoris: Secondary | ICD-10-CM | POA: Diagnosis not present

## 2016-02-10 DIAGNOSIS — Z5181 Encounter for therapeutic drug level monitoring: Secondary | ICD-10-CM

## 2016-02-10 DIAGNOSIS — I9589 Other hypotension: Secondary | ICD-10-CM | POA: Diagnosis not present

## 2016-02-10 DIAGNOSIS — I132 Hypertensive heart and chronic kidney disease with heart failure and with stage 5 chronic kidney disease, or end stage renal disease: Secondary | ICD-10-CM | POA: Diagnosis not present

## 2016-02-10 DIAGNOSIS — I4819 Other persistent atrial fibrillation: Secondary | ICD-10-CM

## 2016-02-10 DIAGNOSIS — L89620 Pressure ulcer of left heel, unstageable: Secondary | ICD-10-CM | POA: Diagnosis not present

## 2016-02-10 DIAGNOSIS — I959 Hypotension, unspecified: Secondary | ICD-10-CM | POA: Insufficient documentation

## 2016-02-10 DIAGNOSIS — E1122 Type 2 diabetes mellitus with diabetic chronic kidney disease: Secondary | ICD-10-CM | POA: Diagnosis not present

## 2016-02-10 DIAGNOSIS — N186 End stage renal disease: Secondary | ICD-10-CM | POA: Diagnosis not present

## 2016-02-10 LAB — POCT INR: INR: 2.5

## 2016-02-10 NOTE — Patient Instructions (Signed)
Medication Instructions:  STOP Imdur (Isosorbide Mononitrate)   Labwork: None Ordered   Testing/Procedures: None Ordered   Follow-Up: Your physician wants you to follow-up in: 6 months with Dr. Acie Fredrickson.  You will receive a reminder letter in the mail two months in advance. If you don't receive a letter, please call our office to schedule the follow-up appointment.   If you need a refill on your cardiac medications before your next appointment, please call your pharmacy.   Thank you for choosing [facility]! Christen Bame, RN [phone]

## 2016-02-10 NOTE — Progress Notes (Signed)
[name] Date of Birth  1934-05-03 [facility] [address]    [place], [place]  [address] [phone]  Fax  [phone]  Problem list:  1. Coronary artery disease 2. chronic kidney disease 3. Atrophic leg 4. Hypothyroidism  History of Present Illness:  [name] is a 82 year old gentleman with a history of coronary artery disease and coronary artery bypass grafting. He recently presented with some right-sided shoulder pain that radiates through to his chest. He had a stress Myoview study which revealed a small inferolateral defect. He was set up for cardiac catheterization but his precath labs revealed a creatinine of 2.4 which was new. His baseline creatinine is between 1.6 and 1.8. We discontinued his HCTZ and his ACE inhibitor.  He was seen by his medical doctor who also stop his metformin.  We were going to do a cardiac catheterization this winter but we canceled the case because of his renal insufficiency. His metformin was stopped and he made some other medical changes in his creatinine has now improved. Fortunately, he's not having any further episodes of angina.  He informed me that his insurance company is no longer paying for Ranexa.  He's been bradycardic for years. We do not have him on beta blocker for that reason.  He's had some shoulder problems. He needs to have surgery on his right shoulder soon. He will eventually need to have surgery on his left shoulder. He had a low risk  Myoview study in November, 2012. He had an echocardiogram in August, 2013 that was also unchanged. He does not follow a strict diabetic diet.  His glucose levels went very high after his steroid injection ( > 500)  September 27, 2012:  Pt is doing well.  He denies any chest pain. Has some shoulder soreness.  He does have chronic dyspnea and has had worsening dyspnea since he had the flu in January.  He stays away from salt.  Nov. 13, 2014;  He contniues to have  dyspnea an CP.  He did not take a NTG.  He typically gets CP with exertion.   He is short of breath at rest and with exertion.  He seems to minimize his symptoms - wife thinks he has more shortness of breath that he admits to.    i was asked to see him again by Dr. Lorrene Reid for worsening dyspnea.  He avoids  salt.   Echo showed LVEF of 65-70% with LVH, .mild aortic stenosis  Feb. 13, 2015:  Pt is feeling well.  No angina.  Kidney function remained stable. His blood pressure is much better controlled during this visit compared to his last visit 3 months ago. He has been staying with her salt  February 13, 2014: Doing the same  Dec. 2, 2015: No new cardiac issues.  Has had some leg swelling.  Also has some red ulceration in the left lower leg.   They look like bug bites to me.  He has hypothyroidism  and is followed by Dr. Sharlett Iles.  Avoids salt.  Sleeps in a recliner - has lots of leg edema.   December 02, 2014  Pt is doing well.  No CP BP is typically elevated.   Dec. 8, 2016: [name] is seen today for  Evaluation of his coronary artery disease. Is seen in a wheelchair today .    Has a right leg boot on .  Fell and injurred his right leg.  No evidence  of fracture.   Aug. 17, 2017:  [name] is seen today for follow up of his chronic diastolic CHF Is on HD on September 18, 2015. BP has been very low . Says that he feels fine  On Imdur  Is a wheel chair today  Uses a walker at home .  Current Outpatient Prescriptions on File Prior to Visit  Medication Sig Dispense Refill  . acetaminophen (TYLENOL) 325 MG tablet Take 2 tablets (650 mg total) by mouth every 4 (four) hours as needed for headache or mild pain.    Marland Kitchen albuterol (PROVENTIL) (2.5 MG/3ML) 0.083% nebulizer solution Take 3 mLs (2.5 mg total) by nebulization every 6 (six) hours as needed for wheezing or shortness of breath. 75 mL 12  . atorvastatin (LIPITOR) 80 MG tablet TAKE ONE TABLET BY MOUTH ONCE DAILY 90 tablet 3  . b  complex-vitamin c-folic acid (NEPHRO-VITE) 0.8 MG TABS tablet Take 1 tablet by mouth at bedtime.    . cholecalciferol (VITAMIN D) 1000 UNITS tablet Take 1,000 Units by mouth daily at 6 PM. D3    . insulin aspart (NOVOLOG FLEXPEN) 100 UNIT/ML FlexPen Inject 8 Units into the skin 2 (two) times daily before a meal.    . Insulin Glargine (LANTUS SOLOSTAR) 100 UNIT/ML SOPN Inject 24 Units into the skin every morning.     . isosorbide mononitrate (IMDUR) 30 MG 24 hr tablet Take 15 mg by mouth daily.     Marland Kitchen levothyroxine (SYNTHROID, LEVOTHROID) 175 MCG tablet Take 175 mcg by mouth daily before breakfast.    . memantine (NAMENDA) 5 MG tablet Take 5 mg by mouth daily.     . Multiple Vitamins-Minerals (DECUBI-VITE) CAPS Take 1 capsule by mouth daily.    . nitroGLYCERIN (NITROSTAT) 0.4 MG SL tablet Place 1 tablet (0.4 mg total) under the tongue every 5 (five) minutes as needed for chest pain. 30 tablet 12  . OXYGEN Inhale 4 L/min into the lungs continuous. To keep O2 sats >90    . ranitidine (ZANTAC) 300 MG tablet Take 300 mg by mouth daily.    Marland Kitchen SANTYL ointment Apply topically to affected foot    . sevelamer carbonate (RENVELA) 2.4 g PACK Take 2.4 g by mouth 3 (three) times daily. 90 each   . warfarin (COUMADIN) 5 MG tablet TAKE AS DIRECTED (Patient taking differently: Take 5 mg by mouth in the evening on Sun/Mon/Tues/Wed/Thurs/Fri and 2.5 mg on Sat) 90 tablet 0  . diphenhydrAMINE (BENADRYL) 25 mg capsule Take 50 mg by mouth See admin instructions. To be taken as a one-time dose for procedure on 01/13/16    . pantoprazole (PROTONIX) 40 MG tablet Take 1 tablet (40 mg total) by mouth daily at 6 (six) AM. (Patient not taking: Reported on 01/10/2016)     No current facility-administered medications on file prior to visit.     Allergies  Allergen Reactions  . Betadine [Povidone Iodine] Rash  . Iodinated Diagnostic Agents Rash and Other (See Comments)    Does fine with premedications for cath  . Latex Hives   . Tape Rash    MUST BE FREE OF ANY LATEX    Past Medical History:  Diagnosis Date  . Atrial fibrillation ([place]) Aug. 2015   a. Dx 01/2014, s/p DCCV 03/06/14.  Marland Kitchen CAD (coronary artery disease)    a. prior CABG in 1997 with redo in 2000. b. last cath in 2008 - managed medically  . Carotid disease, bilateral ([place])    with multiple bilateral  carotid surgeries  . Chronic diastolic CHF (congestive heart failure) ([place])   . Chronic respiratory failure with hypoxia ([place])   . CKD (chronic kidney disease), stage IV ([place])    a. Has fistula in place.   . Degenerative joint disease   . Dementia   . Generalized weakness    without overt findings  . GERD (gastroesophageal reflux disease)   . Gout   . Hyperlipidemia   . Hypertension   . Hypothyroidism   . Meniere's disease   . On home oxygen therapy    "2L" qhs (09/09/2015  . Orthopnea    Two-pillow  . Peripheral vascular disease ([place])   . Psoriasis   . Shingles   . Sinus bradycardia    a. Toprol D/C'd due to this.   . Type 2 diabetes mellitus ([place])     Past Surgical History:  Procedure Laterality Date  . APPENDECTOMY    . AV FISTULA PLACEMENT Left 01/21/2013   Procedure: ARTERIOVENOUS (AV) FISTULA CREATION, Left Brachiocephalic;  Surgeon: Angelia Mould, MD;  Location: [facility];  Service: Vascular;  Laterality: Left;  . BLEPHAROPLASTY Bilateral   . CARDIAC CATHETERIZATION    . CARDIAC CATHETERIZATION  2008   L main irreg, LAD 80%, IMA-LAD & SVG-Diag patent, CFX 100%, SVG-OM 90%, RCA 70%, SVG-RCA OK, EF nl, med rx, no vessels appropriate for PCI  . CARDIAC CATHETERIZATION N/A 01/12/2016   Procedure: Left Heart Cath and Cors/Grafts Angiography;  Surgeon: Belva Crome, MD;  Location: [facility];  Service: Cardiovascular;  Laterality: N/A;  . CARDIOVERSION N/A 03/06/2014   Procedure: CARDIOVERSION;  Surgeon: Larey Dresser, MD;  Location: [facility];  Service: Cardiovascular;  Laterality: N/A;  . CAROTID ENDARTERECTOMY  Bilateral "several times"   "5 on right; 2-3 on left" (09/09/2015)  . CORONARY ARTERY BYPASS GRAFT  1997;  2002  . LUMBAR LAMINECTOMY  July 2001  . LUNG REMOVAL, PARTIAL    . SHOULDER SURGERY Bilateral   . TEE WITHOUT CARDIOVERSION N/A 03/06/2014   Procedure: TRANSESOPHAGEAL ECHOCARDIOGRAM (TEE);  Surgeon: Larey Dresser, MD;  Location: [facility] ENDOSCOPY;  Service: Cardiovascular;  Laterality: N/A;  . THORACOTOMY Left    due to fungal infection    History  Smoking Status  . Never Smoker  Smokeless Tobacco  . Never Used    History  Alcohol Use No    Family History  Problem Relation Age of Onset  . Heart attack Father   . Heart disease Father     After 73 yrs of age  . Hyperlipidemia Father   . Hypertension Father   . Diabetes Mother   . Hypertension Mother   . Heart disease Mother   . Heart disease Brother   . Diabetes Brother     Reviw of Systems:  Reviewed in the HPI.  All other systems are negative.  Physical Exam: BP (!) 72/30 (BP Location: Right Arm, Patient Position: Sitting, Cuff Size: Normal)   Pulse (!) 58   Ht 5\' 10"  (1.778 m)   Wt 173 lb (78.5 kg)   BMI 24.82 kg/m  The patient is alert and oriented x 3.  The mood and affect are normal.   Skin: warm and dry.  Color is normal.   HEENT:   Normocephalic/atraumatic. Mucous membranes are normal.  Bilateral carotid bruits,  Left > right.  Lungs: Lungs are clear.  Heart: Regular rate S1-S2. Has a 2/6 systolic ejection murmur cholesterol border.   Abdomen: His abdominal exam is  benign. Extremities:  No clubbing cyanosis or edema, reduced skin turgur.  Neuro:  Exam is nonfocal.   ECG:   Assessment / Plan:   1. CAD  - he's not having episodes of angina. Continue current medications.  2. Chronic combined systolic and diastolic CHF - we will need to hold his imdur   3. Atrial Fib- remains in NSR , continue amiodarone   4. CKD:  5. Essential HTN:   - BP is high here in the office but was 122 / 80 at his  medical doctors office this am.   6. Dementia:   Wife states that he has been having progressive memory issues.   Going for evaluation next week .  7.  ESRD:  BP is very low.   Will DC Imdur. He appears volume depleted.   I think he needs to have a higher dry weight at dialysis      Mertie Moores, MD  02/10/2016 9:44 AM    [place],  [place], [place]  [address] Pager [phone] Phone: [phone]; Fax: [phone]   [facility]  [address] [place], [place]  [address] [phone]    Fax [phone]

## 2016-02-11 DIAGNOSIS — I10 Essential (primary) hypertension: Secondary | ICD-10-CM | POA: Diagnosis not present

## 2016-02-11 DIAGNOSIS — E11621 Type 2 diabetes mellitus with foot ulcer: Secondary | ICD-10-CM | POA: Diagnosis not present

## 2016-02-11 DIAGNOSIS — N2581 Secondary hyperparathyroidism of renal origin: Secondary | ICD-10-CM | POA: Diagnosis not present

## 2016-02-11 DIAGNOSIS — N184 Chronic kidney disease, stage 4 (severe): Secondary | ICD-10-CM | POA: Diagnosis not present

## 2016-02-11 DIAGNOSIS — E1165 Type 2 diabetes mellitus with hyperglycemia: Secondary | ICD-10-CM | POA: Diagnosis not present

## 2016-02-11 DIAGNOSIS — I2510 Atherosclerotic heart disease of native coronary artery without angina pectoris: Secondary | ICD-10-CM | POA: Diagnosis not present

## 2016-02-11 DIAGNOSIS — N185 Chronic kidney disease, stage 5: Secondary | ICD-10-CM | POA: Diagnosis not present

## 2016-02-11 DIAGNOSIS — D509 Iron deficiency anemia, unspecified: Secondary | ICD-10-CM | POA: Diagnosis not present

## 2016-02-11 DIAGNOSIS — L89159 Pressure ulcer of sacral region, unspecified stage: Secondary | ICD-10-CM | POA: Diagnosis not present

## 2016-02-11 DIAGNOSIS — Z6826 Body mass index (BMI) 26.0-26.9, adult: Secondary | ICD-10-CM | POA: Diagnosis not present

## 2016-02-11 DIAGNOSIS — N186 End stage renal disease: Secondary | ICD-10-CM | POA: Diagnosis not present

## 2016-02-11 DIAGNOSIS — I480 Paroxysmal atrial fibrillation: Secondary | ICD-10-CM | POA: Diagnosis not present

## 2016-02-11 DIAGNOSIS — E1129 Type 2 diabetes mellitus with other diabetic kidney complication: Secondary | ICD-10-CM | POA: Diagnosis not present

## 2016-02-14 DIAGNOSIS — E1129 Type 2 diabetes mellitus with other diabetic kidney complication: Secondary | ICD-10-CM | POA: Diagnosis not present

## 2016-02-14 DIAGNOSIS — N186 End stage renal disease: Secondary | ICD-10-CM | POA: Diagnosis not present

## 2016-02-14 DIAGNOSIS — D509 Iron deficiency anemia, unspecified: Secondary | ICD-10-CM | POA: Diagnosis not present

## 2016-02-14 DIAGNOSIS — N2581 Secondary hyperparathyroidism of renal origin: Secondary | ICD-10-CM | POA: Diagnosis not present

## 2016-02-16 DIAGNOSIS — E1129 Type 2 diabetes mellitus with other diabetic kidney complication: Secondary | ICD-10-CM | POA: Diagnosis not present

## 2016-02-16 DIAGNOSIS — D509 Iron deficiency anemia, unspecified: Secondary | ICD-10-CM | POA: Diagnosis not present

## 2016-02-16 DIAGNOSIS — N2581 Secondary hyperparathyroidism of renal origin: Secondary | ICD-10-CM | POA: Diagnosis not present

## 2016-02-16 DIAGNOSIS — N186 End stage renal disease: Secondary | ICD-10-CM | POA: Diagnosis not present

## 2016-02-17 DIAGNOSIS — I132 Hypertensive heart and chronic kidney disease with heart failure and with stage 5 chronic kidney disease, or end stage renal disease: Secondary | ICD-10-CM | POA: Diagnosis not present

## 2016-02-17 DIAGNOSIS — E1122 Type 2 diabetes mellitus with diabetic chronic kidney disease: Secondary | ICD-10-CM | POA: Diagnosis not present

## 2016-02-17 DIAGNOSIS — I2510 Atherosclerotic heart disease of native coronary artery without angina pectoris: Secondary | ICD-10-CM | POA: Diagnosis not present

## 2016-02-17 DIAGNOSIS — L89620 Pressure ulcer of left heel, unstageable: Secondary | ICD-10-CM | POA: Diagnosis not present

## 2016-02-17 DIAGNOSIS — I5042 Chronic combined systolic (congestive) and diastolic (congestive) heart failure: Secondary | ICD-10-CM | POA: Diagnosis not present

## 2016-02-17 DIAGNOSIS — N186 End stage renal disease: Secondary | ICD-10-CM | POA: Diagnosis not present

## 2016-02-18 DIAGNOSIS — E1129 Type 2 diabetes mellitus with other diabetic kidney complication: Secondary | ICD-10-CM | POA: Diagnosis not present

## 2016-02-18 DIAGNOSIS — D509 Iron deficiency anemia, unspecified: Secondary | ICD-10-CM | POA: Diagnosis not present

## 2016-02-18 DIAGNOSIS — N2581 Secondary hyperparathyroidism of renal origin: Secondary | ICD-10-CM | POA: Diagnosis not present

## 2016-02-18 DIAGNOSIS — N186 End stage renal disease: Secondary | ICD-10-CM | POA: Diagnosis not present

## 2016-02-21 DIAGNOSIS — N186 End stage renal disease: Secondary | ICD-10-CM | POA: Diagnosis not present

## 2016-02-21 DIAGNOSIS — E1129 Type 2 diabetes mellitus with other diabetic kidney complication: Secondary | ICD-10-CM | POA: Diagnosis not present

## 2016-02-21 DIAGNOSIS — N2581 Secondary hyperparathyroidism of renal origin: Secondary | ICD-10-CM | POA: Diagnosis not present

## 2016-02-21 DIAGNOSIS — D509 Iron deficiency anemia, unspecified: Secondary | ICD-10-CM | POA: Diagnosis not present

## 2016-02-23 DIAGNOSIS — E1129 Type 2 diabetes mellitus with other diabetic kidney complication: Secondary | ICD-10-CM | POA: Diagnosis not present

## 2016-02-23 DIAGNOSIS — D509 Iron deficiency anemia, unspecified: Secondary | ICD-10-CM | POA: Diagnosis not present

## 2016-02-23 DIAGNOSIS — N186 End stage renal disease: Secondary | ICD-10-CM | POA: Diagnosis not present

## 2016-02-23 DIAGNOSIS — N2581 Secondary hyperparathyroidism of renal origin: Secondary | ICD-10-CM | POA: Diagnosis not present

## 2016-02-24 DIAGNOSIS — I4891 Unspecified atrial fibrillation: Secondary | ICD-10-CM | POA: Diagnosis not present

## 2016-02-24 DIAGNOSIS — N186 End stage renal disease: Secondary | ICD-10-CM | POA: Diagnosis not present

## 2016-02-24 DIAGNOSIS — I5042 Chronic combined systolic (congestive) and diastolic (congestive) heart failure: Secondary | ICD-10-CM | POA: Diagnosis not present

## 2016-02-24 DIAGNOSIS — I2510 Atherosclerotic heart disease of native coronary artery without angina pectoris: Secondary | ICD-10-CM | POA: Diagnosis not present

## 2016-02-24 DIAGNOSIS — E1122 Type 2 diabetes mellitus with diabetic chronic kidney disease: Secondary | ICD-10-CM | POA: Diagnosis not present

## 2016-02-24 DIAGNOSIS — Z992 Dependence on renal dialysis: Secondary | ICD-10-CM | POA: Diagnosis not present

## 2016-02-24 DIAGNOSIS — L89620 Pressure ulcer of left heel, unstageable: Secondary | ICD-10-CM | POA: Diagnosis not present

## 2016-02-24 DIAGNOSIS — I132 Hypertensive heart and chronic kidney disease with heart failure and with stage 5 chronic kidney disease, or end stage renal disease: Secondary | ICD-10-CM | POA: Diagnosis not present

## 2016-02-24 LAB — PROTIME-INR: INR: 2.2 — AB (ref 0.9–1.1)

## 2016-02-25 ENCOUNTER — Ambulatory Visit (INDEPENDENT_AMBULATORY_CARE_PROVIDER_SITE_OTHER): Payer: Medicare Other | Admitting: Cardiovascular Disease

## 2016-02-25 DIAGNOSIS — E1129 Type 2 diabetes mellitus with other diabetic kidney complication: Secondary | ICD-10-CM | POA: Diagnosis not present

## 2016-02-25 DIAGNOSIS — N186 End stage renal disease: Secondary | ICD-10-CM | POA: Diagnosis not present

## 2016-02-25 DIAGNOSIS — Z5181 Encounter for therapeutic drug level monitoring: Secondary | ICD-10-CM

## 2016-02-25 DIAGNOSIS — D631 Anemia in chronic kidney disease: Secondary | ICD-10-CM | POA: Diagnosis not present

## 2016-02-25 DIAGNOSIS — D509 Iron deficiency anemia, unspecified: Secondary | ICD-10-CM | POA: Diagnosis not present

## 2016-02-25 DIAGNOSIS — N2581 Secondary hyperparathyroidism of renal origin: Secondary | ICD-10-CM | POA: Diagnosis not present

## 2016-02-28 DIAGNOSIS — N2581 Secondary hyperparathyroidism of renal origin: Secondary | ICD-10-CM | POA: Diagnosis not present

## 2016-02-28 DIAGNOSIS — D631 Anemia in chronic kidney disease: Secondary | ICD-10-CM | POA: Diagnosis not present

## 2016-02-28 DIAGNOSIS — N186 End stage renal disease: Secondary | ICD-10-CM | POA: Diagnosis not present

## 2016-02-28 DIAGNOSIS — D509 Iron deficiency anemia, unspecified: Secondary | ICD-10-CM | POA: Diagnosis not present

## 2016-02-28 DIAGNOSIS — E1129 Type 2 diabetes mellitus with other diabetic kidney complication: Secondary | ICD-10-CM | POA: Diagnosis not present

## 2016-03-01 DIAGNOSIS — D509 Iron deficiency anemia, unspecified: Secondary | ICD-10-CM | POA: Diagnosis not present

## 2016-03-01 DIAGNOSIS — N2581 Secondary hyperparathyroidism of renal origin: Secondary | ICD-10-CM | POA: Diagnosis not present

## 2016-03-01 DIAGNOSIS — D631 Anemia in chronic kidney disease: Secondary | ICD-10-CM | POA: Diagnosis not present

## 2016-03-01 DIAGNOSIS — N186 End stage renal disease: Secondary | ICD-10-CM | POA: Diagnosis not present

## 2016-03-01 DIAGNOSIS — E1129 Type 2 diabetes mellitus with other diabetic kidney complication: Secondary | ICD-10-CM | POA: Diagnosis not present

## 2016-03-02 DIAGNOSIS — N186 End stage renal disease: Secondary | ICD-10-CM | POA: Diagnosis not present

## 2016-03-02 DIAGNOSIS — E1122 Type 2 diabetes mellitus with diabetic chronic kidney disease: Secondary | ICD-10-CM | POA: Diagnosis not present

## 2016-03-02 DIAGNOSIS — L89620 Pressure ulcer of left heel, unstageable: Secondary | ICD-10-CM | POA: Diagnosis not present

## 2016-03-02 DIAGNOSIS — I132 Hypertensive heart and chronic kidney disease with heart failure and with stage 5 chronic kidney disease, or end stage renal disease: Secondary | ICD-10-CM | POA: Diagnosis not present

## 2016-03-02 DIAGNOSIS — I2510 Atherosclerotic heart disease of native coronary artery without angina pectoris: Secondary | ICD-10-CM | POA: Diagnosis not present

## 2016-03-02 DIAGNOSIS — I5042 Chronic combined systolic (congestive) and diastolic (congestive) heart failure: Secondary | ICD-10-CM | POA: Diagnosis not present

## 2016-03-03 DIAGNOSIS — D631 Anemia in chronic kidney disease: Secondary | ICD-10-CM | POA: Diagnosis not present

## 2016-03-03 DIAGNOSIS — N2581 Secondary hyperparathyroidism of renal origin: Secondary | ICD-10-CM | POA: Diagnosis not present

## 2016-03-03 DIAGNOSIS — N186 End stage renal disease: Secondary | ICD-10-CM | POA: Diagnosis not present

## 2016-03-03 DIAGNOSIS — E1129 Type 2 diabetes mellitus with other diabetic kidney complication: Secondary | ICD-10-CM | POA: Diagnosis not present

## 2016-03-03 DIAGNOSIS — D509 Iron deficiency anemia, unspecified: Secondary | ICD-10-CM | POA: Diagnosis not present

## 2016-03-06 DIAGNOSIS — N186 End stage renal disease: Secondary | ICD-10-CM | POA: Diagnosis not present

## 2016-03-06 DIAGNOSIS — N2581 Secondary hyperparathyroidism of renal origin: Secondary | ICD-10-CM | POA: Diagnosis not present

## 2016-03-06 DIAGNOSIS — D509 Iron deficiency anemia, unspecified: Secondary | ICD-10-CM | POA: Diagnosis not present

## 2016-03-06 DIAGNOSIS — D631 Anemia in chronic kidney disease: Secondary | ICD-10-CM | POA: Diagnosis not present

## 2016-03-06 DIAGNOSIS — E1129 Type 2 diabetes mellitus with other diabetic kidney complication: Secondary | ICD-10-CM | POA: Diagnosis not present

## 2016-03-08 DIAGNOSIS — E1129 Type 2 diabetes mellitus with other diabetic kidney complication: Secondary | ICD-10-CM | POA: Diagnosis not present

## 2016-03-08 DIAGNOSIS — N186 End stage renal disease: Secondary | ICD-10-CM | POA: Diagnosis not present

## 2016-03-08 DIAGNOSIS — N2581 Secondary hyperparathyroidism of renal origin: Secondary | ICD-10-CM | POA: Diagnosis not present

## 2016-03-08 DIAGNOSIS — D509 Iron deficiency anemia, unspecified: Secondary | ICD-10-CM | POA: Diagnosis not present

## 2016-03-08 DIAGNOSIS — D631 Anemia in chronic kidney disease: Secondary | ICD-10-CM | POA: Diagnosis not present

## 2016-03-09 ENCOUNTER — Ambulatory Visit (INDEPENDENT_AMBULATORY_CARE_PROVIDER_SITE_OTHER): Payer: Medicare Other | Admitting: Cardiovascular Disease

## 2016-03-09 DIAGNOSIS — H35043 Retinal micro-aneurysms, unspecified, bilateral: Secondary | ICD-10-CM | POA: Diagnosis not present

## 2016-03-09 DIAGNOSIS — Z5181 Encounter for therapeutic drug level monitoring: Secondary | ICD-10-CM

## 2016-03-09 DIAGNOSIS — H35071 Retinal telangiectasis, right eye: Secondary | ICD-10-CM | POA: Diagnosis not present

## 2016-03-09 DIAGNOSIS — E113393 Type 2 diabetes mellitus with moderate nonproliferative diabetic retinopathy without macular edema, bilateral: Secondary | ICD-10-CM | POA: Diagnosis not present

## 2016-03-09 DIAGNOSIS — H43813 Vitreous degeneration, bilateral: Secondary | ICD-10-CM | POA: Diagnosis not present

## 2016-03-09 LAB — POCT INR: INR: 2.3

## 2016-03-09 MED FILL — SANTYL OINTMENT: 250 | 10 days supply | Qty: 30 | Fill #1

## 2016-03-10 DIAGNOSIS — E1129 Type 2 diabetes mellitus with other diabetic kidney complication: Secondary | ICD-10-CM | POA: Diagnosis not present

## 2016-03-10 DIAGNOSIS — N2581 Secondary hyperparathyroidism of renal origin: Secondary | ICD-10-CM | POA: Diagnosis not present

## 2016-03-10 DIAGNOSIS — D509 Iron deficiency anemia, unspecified: Secondary | ICD-10-CM | POA: Diagnosis not present

## 2016-03-10 DIAGNOSIS — N186 End stage renal disease: Secondary | ICD-10-CM | POA: Diagnosis not present

## 2016-03-10 DIAGNOSIS — D631 Anemia in chronic kidney disease: Secondary | ICD-10-CM | POA: Diagnosis not present

## 2016-03-13 DIAGNOSIS — N2581 Secondary hyperparathyroidism of renal origin: Secondary | ICD-10-CM | POA: Diagnosis not present

## 2016-03-13 DIAGNOSIS — D631 Anemia in chronic kidney disease: Secondary | ICD-10-CM | POA: Diagnosis not present

## 2016-03-13 DIAGNOSIS — N186 End stage renal disease: Secondary | ICD-10-CM | POA: Diagnosis not present

## 2016-03-13 DIAGNOSIS — E1129 Type 2 diabetes mellitus with other diabetic kidney complication: Secondary | ICD-10-CM | POA: Diagnosis not present

## 2016-03-13 DIAGNOSIS — D509 Iron deficiency anemia, unspecified: Secondary | ICD-10-CM | POA: Diagnosis not present

## 2016-03-15 DIAGNOSIS — N186 End stage renal disease: Secondary | ICD-10-CM | POA: Diagnosis not present

## 2016-03-15 DIAGNOSIS — D631 Anemia in chronic kidney disease: Secondary | ICD-10-CM | POA: Diagnosis not present

## 2016-03-15 DIAGNOSIS — N2581 Secondary hyperparathyroidism of renal origin: Secondary | ICD-10-CM | POA: Diagnosis not present

## 2016-03-15 DIAGNOSIS — E1129 Type 2 diabetes mellitus with other diabetic kidney complication: Secondary | ICD-10-CM | POA: Diagnosis not present

## 2016-03-15 DIAGNOSIS — D509 Iron deficiency anemia, unspecified: Secondary | ICD-10-CM | POA: Diagnosis not present

## 2016-03-17 DIAGNOSIS — D509 Iron deficiency anemia, unspecified: Secondary | ICD-10-CM | POA: Diagnosis not present

## 2016-03-17 DIAGNOSIS — D631 Anemia in chronic kidney disease: Secondary | ICD-10-CM | POA: Diagnosis not present

## 2016-03-17 DIAGNOSIS — N186 End stage renal disease: Secondary | ICD-10-CM | POA: Diagnosis not present

## 2016-03-17 DIAGNOSIS — E1129 Type 2 diabetes mellitus with other diabetic kidney complication: Secondary | ICD-10-CM | POA: Diagnosis not present

## 2016-03-17 DIAGNOSIS — N2581 Secondary hyperparathyroidism of renal origin: Secondary | ICD-10-CM | POA: Diagnosis not present

## 2016-03-20 DIAGNOSIS — E1129 Type 2 diabetes mellitus with other diabetic kidney complication: Secondary | ICD-10-CM | POA: Diagnosis not present

## 2016-03-20 DIAGNOSIS — D509 Iron deficiency anemia, unspecified: Secondary | ICD-10-CM | POA: Diagnosis not present

## 2016-03-20 DIAGNOSIS — N2581 Secondary hyperparathyroidism of renal origin: Secondary | ICD-10-CM | POA: Diagnosis not present

## 2016-03-20 DIAGNOSIS — N186 End stage renal disease: Secondary | ICD-10-CM | POA: Diagnosis not present

## 2016-03-20 DIAGNOSIS — D631 Anemia in chronic kidney disease: Secondary | ICD-10-CM | POA: Diagnosis not present

## 2016-03-22 DIAGNOSIS — E1129 Type 2 diabetes mellitus with other diabetic kidney complication: Secondary | ICD-10-CM | POA: Diagnosis not present

## 2016-03-22 DIAGNOSIS — D631 Anemia in chronic kidney disease: Secondary | ICD-10-CM | POA: Diagnosis not present

## 2016-03-22 DIAGNOSIS — N186 End stage renal disease: Secondary | ICD-10-CM | POA: Diagnosis not present

## 2016-03-22 DIAGNOSIS — N2581 Secondary hyperparathyroidism of renal origin: Secondary | ICD-10-CM | POA: Diagnosis not present

## 2016-03-22 DIAGNOSIS — D509 Iron deficiency anemia, unspecified: Secondary | ICD-10-CM | POA: Diagnosis not present

## 2016-03-24 ENCOUNTER — Other Ambulatory Visit: Payer: Self-pay | Admitting: Cardiovascular Disease

## 2016-03-24 DIAGNOSIS — N186 End stage renal disease: Secondary | ICD-10-CM | POA: Diagnosis not present

## 2016-03-24 DIAGNOSIS — N2581 Secondary hyperparathyroidism of renal origin: Secondary | ICD-10-CM | POA: Diagnosis not present

## 2016-03-24 DIAGNOSIS — D509 Iron deficiency anemia, unspecified: Secondary | ICD-10-CM | POA: Diagnosis not present

## 2016-03-24 DIAGNOSIS — E1129 Type 2 diabetes mellitus with other diabetic kidney complication: Secondary | ICD-10-CM | POA: Diagnosis not present

## 2016-03-24 DIAGNOSIS — D631 Anemia in chronic kidney disease: Secondary | ICD-10-CM | POA: Diagnosis not present

## 2016-03-25 DIAGNOSIS — Z992 Dependence on renal dialysis: Secondary | ICD-10-CM | POA: Diagnosis not present

## 2016-03-25 DIAGNOSIS — N186 End stage renal disease: Secondary | ICD-10-CM | POA: Diagnosis not present

## 2016-03-25 DIAGNOSIS — E1122 Type 2 diabetes mellitus with diabetic chronic kidney disease: Secondary | ICD-10-CM | POA: Diagnosis not present

## 2016-03-27 DIAGNOSIS — D631 Anemia in chronic kidney disease: Secondary | ICD-10-CM | POA: Diagnosis not present

## 2016-03-27 DIAGNOSIS — D509 Iron deficiency anemia, unspecified: Secondary | ICD-10-CM | POA: Diagnosis not present

## 2016-03-27 DIAGNOSIS — N186 End stage renal disease: Secondary | ICD-10-CM | POA: Diagnosis not present

## 2016-03-27 DIAGNOSIS — E1129 Type 2 diabetes mellitus with other diabetic kidney complication: Secondary | ICD-10-CM | POA: Diagnosis not present

## 2016-03-27 DIAGNOSIS — N2581 Secondary hyperparathyroidism of renal origin: Secondary | ICD-10-CM | POA: Diagnosis not present

## 2016-03-29 DIAGNOSIS — D631 Anemia in chronic kidney disease: Secondary | ICD-10-CM | POA: Diagnosis not present

## 2016-03-29 DIAGNOSIS — E1129 Type 2 diabetes mellitus with other diabetic kidney complication: Secondary | ICD-10-CM | POA: Diagnosis not present

## 2016-03-29 DIAGNOSIS — N2581 Secondary hyperparathyroidism of renal origin: Secondary | ICD-10-CM | POA: Diagnosis not present

## 2016-03-29 DIAGNOSIS — D509 Iron deficiency anemia, unspecified: Secondary | ICD-10-CM | POA: Diagnosis not present

## 2016-03-29 DIAGNOSIS — N186 End stage renal disease: Secondary | ICD-10-CM | POA: Diagnosis not present

## 2016-03-30 ENCOUNTER — Ambulatory Visit (INDEPENDENT_AMBULATORY_CARE_PROVIDER_SITE_OTHER): Payer: Medicare Other | Admitting: Pharmacist

## 2016-03-30 DIAGNOSIS — E1151 Type 2 diabetes mellitus with diabetic peripheral angiopathy without gangrene: Secondary | ICD-10-CM | POA: Diagnosis not present

## 2016-03-30 DIAGNOSIS — Z5181 Encounter for therapeutic drug level monitoring: Secondary | ICD-10-CM | POA: Diagnosis not present

## 2016-03-30 DIAGNOSIS — Z23 Encounter for immunization: Secondary | ICD-10-CM | POA: Diagnosis not present

## 2016-03-30 DIAGNOSIS — Z6826 Body mass index (BMI) 26.0-26.9, adult: Secondary | ICD-10-CM | POA: Diagnosis not present

## 2016-03-30 DIAGNOSIS — E11621 Type 2 diabetes mellitus with foot ulcer: Secondary | ICD-10-CM | POA: Diagnosis not present

## 2016-03-30 LAB — POCT INR: INR: 3.1

## 2016-03-31 DIAGNOSIS — D631 Anemia in chronic kidney disease: Secondary | ICD-10-CM | POA: Diagnosis not present

## 2016-03-31 DIAGNOSIS — D509 Iron deficiency anemia, unspecified: Secondary | ICD-10-CM | POA: Diagnosis not present

## 2016-03-31 DIAGNOSIS — E1129 Type 2 diabetes mellitus with other diabetic kidney complication: Secondary | ICD-10-CM | POA: Diagnosis not present

## 2016-03-31 DIAGNOSIS — N2581 Secondary hyperparathyroidism of renal origin: Secondary | ICD-10-CM | POA: Diagnosis not present

## 2016-03-31 DIAGNOSIS — N186 End stage renal disease: Secondary | ICD-10-CM | POA: Diagnosis not present

## 2016-04-03 DIAGNOSIS — D631 Anemia in chronic kidney disease: Secondary | ICD-10-CM | POA: Diagnosis not present

## 2016-04-03 DIAGNOSIS — E1129 Type 2 diabetes mellitus with other diabetic kidney complication: Secondary | ICD-10-CM | POA: Diagnosis not present

## 2016-04-03 DIAGNOSIS — N2581 Secondary hyperparathyroidism of renal origin: Secondary | ICD-10-CM | POA: Diagnosis not present

## 2016-04-03 DIAGNOSIS — D509 Iron deficiency anemia, unspecified: Secondary | ICD-10-CM | POA: Diagnosis not present

## 2016-04-03 DIAGNOSIS — N186 End stage renal disease: Secondary | ICD-10-CM | POA: Diagnosis not present

## 2016-04-05 ENCOUNTER — Emergency Department (HOSPITAL_COMMUNITY)
Admission: EM | Admit: 2016-04-05 | Discharge: 2016-04-05 | Disposition: A | Discharge status: Home / Self Care | Payer: Medicare Other | Attending: Emergency Medicine | Admitting: Emergency Medicine

## 2016-04-05 ENCOUNTER — Emergency Department (HOSPITAL_COMMUNITY): Payer: Medicare Other

## 2016-04-05 ENCOUNTER — Encounter (HOSPITAL_COMMUNITY): Payer: Self-pay

## 2016-04-05 DIAGNOSIS — Z7901 Long term (current) use of anticoagulants: Secondary | ICD-10-CM | POA: Diagnosis not present

## 2016-04-05 DIAGNOSIS — R0600 Dyspnea, unspecified: Secondary | ICD-10-CM

## 2016-04-05 DIAGNOSIS — R0602 Shortness of breath: Secondary | ICD-10-CM | POA: Diagnosis not present

## 2016-04-05 DIAGNOSIS — D631 Anemia in chronic kidney disease: Secondary | ICD-10-CM | POA: Diagnosis not present

## 2016-04-05 DIAGNOSIS — E1122 Type 2 diabetes mellitus with diabetic chronic kidney disease: Secondary | ICD-10-CM | POA: Insufficient documentation

## 2016-04-05 DIAGNOSIS — I132 Hypertensive heart and chronic kidney disease with heart failure and with stage 5 chronic kidney disease, or end stage renal disease: Secondary | ICD-10-CM | POA: Insufficient documentation

## 2016-04-05 DIAGNOSIS — Z992 Dependence on renal dialysis: Secondary | ICD-10-CM | POA: Insufficient documentation

## 2016-04-05 DIAGNOSIS — N186 End stage renal disease: Secondary | ICD-10-CM | POA: Diagnosis not present

## 2016-04-05 DIAGNOSIS — I2510 Atherosclerotic heart disease of native coronary artery without angina pectoris: Secondary | ICD-10-CM | POA: Diagnosis not present

## 2016-04-05 DIAGNOSIS — I5042 Chronic combined systolic (congestive) and diastolic (congestive) heart failure: Secondary | ICD-10-CM | POA: Diagnosis not present

## 2016-04-05 DIAGNOSIS — D509 Iron deficiency anemia, unspecified: Secondary | ICD-10-CM | POA: Diagnosis not present

## 2016-04-05 DIAGNOSIS — Z91040 Latex allergy status: Secondary | ICD-10-CM | POA: Insufficient documentation

## 2016-04-05 DIAGNOSIS — E1129 Type 2 diabetes mellitus with other diabetic kidney complication: Secondary | ICD-10-CM | POA: Diagnosis not present

## 2016-04-05 DIAGNOSIS — E1151 Type 2 diabetes mellitus with diabetic peripheral angiopathy without gangrene: Secondary | ICD-10-CM | POA: Diagnosis not present

## 2016-04-05 DIAGNOSIS — I120 Hypertensive chronic kidney disease with stage 5 chronic kidney disease or end stage renal disease: Secondary | ICD-10-CM | POA: Diagnosis not present

## 2016-04-05 DIAGNOSIS — E039 Hypothyroidism, unspecified: Secondary | ICD-10-CM | POA: Insufficient documentation

## 2016-04-05 DIAGNOSIS — Z951 Presence of aortocoronary bypass graft: Secondary | ICD-10-CM | POA: Diagnosis not present

## 2016-04-05 DIAGNOSIS — Z794 Long term (current) use of insulin: Secondary | ICD-10-CM | POA: Diagnosis not present

## 2016-04-05 DIAGNOSIS — N2581 Secondary hyperparathyroidism of renal origin: Secondary | ICD-10-CM | POA: Diagnosis not present

## 2016-04-05 LAB — I-STAT TROPONIN, ED: TROPONIN I, POC: 0.04 ng/mL (ref 0.00–0.08)

## 2016-04-05 LAB — CBC WITH DIFFERENTIAL/PLATELET
BASOS ABS: 0 10*3/uL (ref 0.0–0.1)
Basophils Relative: 0 %
EOS PCT: 3 %
Eosinophils Absolute: 0.1 10*3/uL (ref 0.0–0.7)
HEMATOCRIT: 33.6 % — AB (ref 39.0–52.0)
Hemoglobin: 11 g/dL — ABNORMAL LOW (ref 13.0–17.0)
LYMPHS PCT: 19 %
Lymphs Abs: 1 10*3/uL (ref 0.7–4.0)
MCH: 31.5 pg (ref 26.0–34.0)
MCHC: 32.7 g/dL (ref 30.0–36.0)
MCV: 96.3 fL (ref 78.0–100.0)
MONO ABS: 0.4 10*3/uL (ref 0.1–1.0)
MONOS PCT: 7 %
Neutro Abs: 3.7 10*3/uL (ref 1.7–7.7)
Neutrophils Relative %: 71 %
PLATELETS: 115 10*3/uL — AB (ref 150–400)
RBC: 3.49 MIL/uL — ABNORMAL LOW (ref 4.22–5.81)
RDW: 15.3 % (ref 11.5–15.5)
WBC: 5.2 10*3/uL (ref 4.0–10.5)

## 2016-04-05 LAB — PROTIME-INR
INR: 2.58
Prothrombin Time: 28.2 seconds — ABNORMAL HIGH (ref 11.4–15.2)

## 2016-04-05 LAB — I-STAT CHEM 8, ED
BUN: 42 mg/dL — ABNORMAL HIGH (ref 6–20)
CALCIUM ION: 1.03 mmol/L — AB (ref 1.15–1.40)
CREATININE: 5.5 mg/dL — AB (ref 0.61–1.24)
Chloride: 97 mmol/L — ABNORMAL LOW (ref 101–111)
GLUCOSE: 100 mg/dL — AB (ref 65–99)
HCT: 31 % — ABNORMAL LOW (ref 39.0–52.0)
HEMOGLOBIN: 10.5 g/dL — AB (ref 13.0–17.0)
POTASSIUM: 3.9 mmol/L (ref 3.5–5.1)
Sodium: 138 mmol/L (ref 135–145)
TCO2: 29 mmol/L (ref 0–100)

## 2016-04-05 NOTE — ED Triage Notes (Signed)
Woke up SOB and couldn't make it to the kitchen without his oxygen. Normally wears 2L at night and sleeps in a recliner. Due for dialysis today and hasn't missed any recent dialysis appointments. Pt. Breathing shallow with audible noises but O2 sats normal

## 2016-04-05 NOTE — ED Notes (Signed)
Spoke with CT and [facility] about why the X-ray results had not been sent. They said that there was an IT problem and they are working to resolve the issue

## 2016-04-05 NOTE — ED Provider Notes (Signed)
[facility] DEPT Provider Note   CSN: [id number] Arrival date & time: 04/05/16  0909     History   Chief Complaint Chief Complaint  Patient presents with  . Shortness of Breath    HPI [name] is a 82 y.o. male.  HPI Patient presents with shortness of breath. Began around 8:00 this morning. States he is feeling somewhat better now. No chest pain. Has had a little bit of a cough. States that he is not necessary feel fluid overloaded. No nausea vomiting diarrhea. No chest pain. He has not been around anyone sick. He is a dialysis patient and was last dialyzed on Monday, which is his normal dialysis day. Today is Wednesday and he is due for dialysis at 11:30. Patient has oxygen that he uses as needed at night.   Past Medical History:  Diagnosis Date  . Atrial fibrillation ([place]) Aug. 2015   a. Dx 01/2014, s/p DCCV 03/06/14.  Marland Kitchen CAD (coronary artery disease)    a. prior CABG in 1997 with redo in 2000. b. last cath in 2008 - managed medically  . Carotid disease, bilateral ([place])    with multiple bilateral carotid surgeries  . Chronic diastolic CHF (congestive heart failure) ([place])   . Chronic respiratory failure with hypoxia ([place])   . CKD (chronic kidney disease), stage IV ([place])    a. Has fistula in place.   . Degenerative joint disease   . Dementia   . Generalized weakness    without overt findings  . GERD (gastroesophageal reflux disease)   . Gout   . Hyperlipidemia   . Hypertension   . Hypothyroidism   . Meniere's disease   . On home oxygen therapy    "2L" qhs (09/09/2015  . Orthopnea    Two-pillow  . Peripheral vascular disease ([place])   . Psoriasis   . Shingles   . Sinus bradycardia    a. Toprol D/C'd due to this.   . Type 2 diabetes mellitus [facility])     Patient Active Problem List   Diagnosis Date Noted  . Hypotension 02/10/2016  . Dehydration 02/10/2016  . Pressure ulcer 01/11/2016  . Chest pain 01/10/2016  . ESRD on dialysis ([place])   . Palliative care  encounter   . Goals of care, counseling/discussion   . Acute on chronic diastolic heart failure ([place]) 09/09/2015  . Decreased pedal pulses 06/03/2015  . Fall 03/24/2014  . Aftercare following surgery of the circulatory system, [place] 03/18/2014  . Chronic combined systolic and diastolic CHF (congestive heart failure) ([place]) 03/18/2014  . Hypertensive heart disease with heart failure ([place]) 03/18/2014  . CKD (chronic kidney disease), stage IV ([place]) 03/18/2014  . Hypothyroidism 03/18/2014  . Encounter for therapeutic drug monitoring 02/19/2014  . Atrial fibrillation ([place]) 02/13/2014  . Peripheral vascular disease, unspecified 11/12/2013  . Occlusion and stenosis of carotid artery without mention of cerebral infarction 03/06/2012  . DOE (dyspnea on exertion) 02/13/2012  . Hyperlipidemia 08/30/2011  . CAD (coronary artery disease) 06/19/2007  . Diabetes mellitus with peripheral vascular disease ([place]) 06/17/2007    Past Surgical History:  Procedure Laterality Date  . APPENDECTOMY    . AV FISTULA PLACEMENT Left 01/21/2013   Procedure: ARTERIOVENOUS (AV) FISTULA CREATION, Left Brachiocephalic;  Surgeon: Angelia Mould, MD;  Location: [facility];  Service: Vascular;  Laterality: Left;  . BLEPHAROPLASTY Bilateral   . CARDIAC CATHETERIZATION    . CARDIAC CATHETERIZATION  2008   L main irreg, LAD 80%, IMA-LAD & SVG-Diag patent,  CFX 100%, SVG-OM 90%, RCA 70%, SVG-RCA OK, EF nl, med rx, no vessels appropriate for PCI  . CARDIAC CATHETERIZATION N/A 01/12/2016   Procedure: Left Heart Cath and Cors/Grafts Angiography;  Surgeon: Belva Crome, MD;  Location: [facility];  Service: Cardiovascular;  Laterality: N/A;  . CARDIOVERSION N/A 03/06/2014   Procedure: CARDIOVERSION;  Surgeon: Larey Dresser, MD;  Location: [facility];  Service: Cardiovascular;  Laterality: N/A;  . CAROTID ENDARTERECTOMY Bilateral "several times"   "5 on right; 2-3 on left" (09/09/2015)  . CORONARY ARTERY BYPASS GRAFT   1997;  2002  . LUMBAR LAMINECTOMY  July 2001  . LUNG REMOVAL, PARTIAL    . SHOULDER SURGERY Bilateral   . TEE WITHOUT CARDIOVERSION N/A 03/06/2014   Procedure: TRANSESOPHAGEAL ECHOCARDIOGRAM (TEE);  Surgeon: Larey Dresser, MD;  Location: [facility];  Service: Cardiovascular;  Laterality: N/A;  . THORACOTOMY Left    due to fungal infection       Home Medications    Prior to Admission medications   Medication Sig Start Date End Date Taking? Authorizing Provider  acetaminophen (TYLENOL) 325 MG tablet Take 2 tablets (650 mg total) by mouth every 4 (four) hours as needed for headache or mild pain. 09/29/15  Yes Amy D Clegg, NP  atorvastatin (LIPITOR) 80 MG tablet TAKE ONE TABLET BY MOUTH ONCE DAILY 02/26/15  Yes Thayer Headings, MD  b complex-vitamin c-folic acid (NEPHRO-VITE) 0.8 MG TABS tablet Take 1 tablet by mouth at bedtime.   Yes Historical Provider, MD  cholecalciferol (VITAMIN D) 1000 UNITS tablet Take 1,000 Units by mouth daily at 6 PM. D3   Yes Historical Provider, MD  diphenhydrAMINE (BENADRYL) 25 mg capsule Take 50 mg by mouth See admin instructions. To be taken as a one-time dose for procedure on 01/13/16   Yes Historical Provider, MD  insulin aspart (NOVOLOG FLEXPEN) 100 UNIT/ML FlexPen Inject 8 Units into the skin 2 (two) times daily before a meal.   Yes Historical Provider, MD  Insulin Glargine (LANTUS SOLOSTAR) 100 UNIT/ML SOPN Inject 24 Units into the skin every morning.    Yes Historical Provider, MD  levothyroxine (SYNTHROID, LEVOTHROID) 175 MCG tablet Take 175 mcg by mouth daily before breakfast.   Yes Historical Provider, MD  memantine (NAMENDA) 5 MG tablet Take 5 mg by mouth daily.  03/12/15  Yes Historical Provider, MD  Multiple Vitamins-Minerals (DECUBI-VITE) CAPS Take 1 capsule by mouth daily.   Yes Historical Provider, MD  OXYGEN Inhale 4 L/min into the lungs continuous. To keep O2 sats >90   Yes Historical Provider, MD  ranitidine (ZANTAC) 300 MG tablet Take 300 mg by  mouth daily. 12/04/15  Yes Historical Provider, MD  SANTYL ointment Apply topically to affected foot 12/30/15  Yes Historical Provider, MD  sevelamer carbonate (RENVELA) 2.4 g PACK Take 2.4 g by mouth 3 (three) times daily. 09/29/15  Yes Amy D Clegg, NP  warfarin (COUMADIN) 5 MG tablet Take 5-7.5 mg by mouth daily. 5 mg every day except Monday Thursday patient takes 7.5 mg   Yes Historical Provider, MD  albuterol (PROVENTIL) (2.5 MG/3ML) 0.083% nebulizer solution Take 3 mLs (2.5 mg total) by nebulization every 6 (six) hours as needed for wheezing or shortness of breath. Patient not taking: Reported on 04/05/2016 09/29/15   Amy D Clegg, NP  nitroGLYCERIN (NITROSTAT) 0.4 MG SL tablet Place 1 tablet (0.4 mg total) under the tongue every 5 (five) minutes as needed for chest pain. 01/12/16   Ripudeep K  Rai, MD  pantoprazole (PROTONIX) 40 MG tablet Take 1 tablet (40 mg total) by mouth daily at 6 (six) AM. Patient not taking: Reported on 04/05/2016 09/29/15   Amy D Clegg, NP  warfarin (COUMADIN) 5 MG tablet TAKE AS DIRECTED Patient not taking: Reported on 04/05/2016 03/24/16   Thayer Headings, MD    Family History Family History  Problem Relation Age of Onset  . Heart attack Father   . Heart disease Father     After 42 yrs of age  . Hyperlipidemia Father   . Hypertension Father   . Diabetes Mother   . Hypertension Mother   . Heart disease Mother   . Heart disease Brother   . Diabetes Brother     Social History Social History  Substance Use Topics  . Smoking status: Never Smoker  . Smokeless tobacco: Never Used  . Alcohol use No     Allergies   Betadine [povidone iodine]; Iodinated diagnostic agents; Latex; and Tape   Review of Systems Review of Systems  Constitutional: Negative for appetite change.  HENT: Negative for congestion.   Respiratory: Positive for cough and shortness of breath.   Cardiovascular: Negative for chest pain and leg swelling.  Gastrointestinal: Negative for abdominal  pain.  Genitourinary: Negative for flank pain.  Musculoskeletal: Negative for back pain.  Neurological: Negative for headaches.  Hematological: Negative for adenopathy.     Physical Exam Updated Vital Signs BP 165/65 (BP Location: Right Arm)   Pulse 61   Temp 98.2 F (36.8 C) (Oral)   Resp 20   Ht 5\' 10"  (1.778 m)   Wt 173 lb (78.5 kg)   SpO2 100%   BMI 24.82 kg/m   Physical Exam  Constitutional: He appears well-developed.  HENT:  Head: Atraumatic.  Eyes: EOM are normal.  Neck: Neck supple. No JVD present.  Cardiovascular: Normal rate.   Pulmonary/Chest:  Patient became somewhat dyspneic with sitting up. Harsh breath sounds, worse on the right.  Abdominal: There is no tenderness.  Musculoskeletal: He exhibits no edema.  Dialysis graft left upper extremity.  Neurological: He is alert.  Skin: Skin is warm. Capillary refill takes less than 2 seconds.     ED Treatments / Results  Labs (all labs ordered are listed, but only abnormal results are displayed) Labs Reviewed  CBC WITH DIFFERENTIAL/PLATELET - Abnormal; Notable for the following:       Result Value   RBC 3.49 (*)    Hemoglobin 11.0 (*)    HCT 33.6 (*)    Platelets 115 (*)    All other components within normal limits  PROTIME-INR - Abnormal; Notable for the following:    Prothrombin Time 28.2 (*)    All other components within normal limits  I-STAT CHEM 8, ED - Abnormal; Notable for the following:    Chloride 97 (*)    BUN 42 (*)    Creatinine, Ser 5.50 (*)    Glucose, Bld 100 (*)    Calcium, Ion 1.03 (*)    Hemoglobin 10.5 (*)    HCT 31.0 (*)    All other components within normal limits  I-STAT TROPOININ, ED    EKG  EKG Interpretation  Date/Time:  Wednesday April 05 2016 09:16:56 EDT Ventricular Rate:  58 PR Interval:    QRS Duration: 109 QT Interval:  480 QTC Calculation: 472 R Axis:   56 Text Interpretation:  Sinus rhythm Anterior infarct, old No significant change since last tracing  Confirmed by [facility]  MD, Ovid Curd [phone]) on 04/05/2016 10:03:10 AM       Radiology Dg Chest 2 View  Result Date: 04/05/2016 CLINICAL DATA:  Shortness of breath. EXAM: CHEST  2 VIEW COMPARISON:  Radiographs of January 10, 2016. FINDINGS: Stable cardiomediastinal silhouette. Status post coronary artery bypass graft. Atherosclerosis of thoracic aorta is noted. No pneumothorax or pleural effusion is noted. Bony thorax is unremarkable. No acute pulmonary disease is noted. IMPRESSION: No active cardiopulmonary disease.  Aortic atherosclerosis. Electronically Signed   By: Marijo Conception, M.D.   On: 04/05/2016 12:28    Procedures Procedures (including critical care time)  Medications Ordered in ED Medications - No data to display   Initial Impression / Assessment and Plan / ED Course  I have reviewed the triage vital signs and the nursing notes.  Pertinent labs & imaging results that were available during my care of the patient were reviewed by me and considered in my medical decision making (see chart for details).  Clinical Course    Patient with shortness of breath. Began this morning is improved. Due for dialysis today but patient also states he is not ago now. No chest pain. X-ray and lab work reassuring. Feels better and will be discharged home. Patient is also not willing to stay and may not be willing to go to dialysis today.  Final Clinical Impressions(s) / ED Diagnoses   Final diagnoses:  Dyspnea, unspecified type  End stage renal disease on dialysis [facility])    New Prescriptions New Prescriptions   No medications on file     Davonna Belling, MD 04/05/16 1251

## 2016-04-05 NOTE — ED Notes (Signed)
Papers reviewed and understanding verbalized. Pt. Given a sandwich and leaving with wife

## 2016-04-05 NOTE — ED Notes (Signed)
Placed patient into a gown on a monitor did ekg shown to dr Alvino Chapel

## 2016-04-07 DIAGNOSIS — E1129 Type 2 diabetes mellitus with other diabetic kidney complication: Secondary | ICD-10-CM | POA: Diagnosis not present

## 2016-04-07 DIAGNOSIS — N186 End stage renal disease: Secondary | ICD-10-CM | POA: Diagnosis not present

## 2016-04-07 DIAGNOSIS — D509 Iron deficiency anemia, unspecified: Secondary | ICD-10-CM | POA: Diagnosis not present

## 2016-04-07 DIAGNOSIS — N2581 Secondary hyperparathyroidism of renal origin: Secondary | ICD-10-CM | POA: Diagnosis not present

## 2016-04-07 DIAGNOSIS — D631 Anemia in chronic kidney disease: Secondary | ICD-10-CM | POA: Diagnosis not present

## 2016-04-10 DIAGNOSIS — E1129 Type 2 diabetes mellitus with other diabetic kidney complication: Secondary | ICD-10-CM | POA: Diagnosis not present

## 2016-04-10 DIAGNOSIS — N2581 Secondary hyperparathyroidism of renal origin: Secondary | ICD-10-CM | POA: Diagnosis not present

## 2016-04-10 DIAGNOSIS — D631 Anemia in chronic kidney disease: Secondary | ICD-10-CM | POA: Diagnosis not present

## 2016-04-10 DIAGNOSIS — N186 End stage renal disease: Secondary | ICD-10-CM | POA: Diagnosis not present

## 2016-04-10 DIAGNOSIS — D509 Iron deficiency anemia, unspecified: Secondary | ICD-10-CM | POA: Diagnosis not present

## 2016-04-12 DIAGNOSIS — N186 End stage renal disease: Secondary | ICD-10-CM | POA: Diagnosis not present

## 2016-04-12 DIAGNOSIS — D509 Iron deficiency anemia, unspecified: Secondary | ICD-10-CM | POA: Diagnosis not present

## 2016-04-12 DIAGNOSIS — N2581 Secondary hyperparathyroidism of renal origin: Secondary | ICD-10-CM | POA: Diagnosis not present

## 2016-04-12 DIAGNOSIS — D631 Anemia in chronic kidney disease: Secondary | ICD-10-CM | POA: Diagnosis not present

## 2016-04-12 DIAGNOSIS — E1129 Type 2 diabetes mellitus with other diabetic kidney complication: Secondary | ICD-10-CM | POA: Diagnosis not present

## 2016-04-14 DIAGNOSIS — E1129 Type 2 diabetes mellitus with other diabetic kidney complication: Secondary | ICD-10-CM | POA: Diagnosis not present

## 2016-04-14 DIAGNOSIS — N186 End stage renal disease: Secondary | ICD-10-CM | POA: Diagnosis not present

## 2016-04-14 DIAGNOSIS — N2581 Secondary hyperparathyroidism of renal origin: Secondary | ICD-10-CM | POA: Diagnosis not present

## 2016-04-14 DIAGNOSIS — D509 Iron deficiency anemia, unspecified: Secondary | ICD-10-CM | POA: Diagnosis not present

## 2016-04-14 DIAGNOSIS — D631 Anemia in chronic kidney disease: Secondary | ICD-10-CM | POA: Diagnosis not present

## 2016-04-17 DIAGNOSIS — D631 Anemia in chronic kidney disease: Secondary | ICD-10-CM | POA: Diagnosis not present

## 2016-04-17 DIAGNOSIS — N2581 Secondary hyperparathyroidism of renal origin: Secondary | ICD-10-CM | POA: Diagnosis not present

## 2016-04-17 DIAGNOSIS — N186 End stage renal disease: Secondary | ICD-10-CM | POA: Diagnosis not present

## 2016-04-17 DIAGNOSIS — E1129 Type 2 diabetes mellitus with other diabetic kidney complication: Secondary | ICD-10-CM | POA: Diagnosis not present

## 2016-04-17 DIAGNOSIS — D509 Iron deficiency anemia, unspecified: Secondary | ICD-10-CM | POA: Diagnosis not present

## 2016-04-18 ENCOUNTER — Encounter (HOSPITAL_BASED_OUTPATIENT_CLINIC_OR_DEPARTMENT_OTHER): Payer: Medicare Other | Attending: Surgery

## 2016-04-18 DIAGNOSIS — M1990 Unspecified osteoarthritis, unspecified site: Secondary | ICD-10-CM | POA: Diagnosis not present

## 2016-04-18 DIAGNOSIS — N186 End stage renal disease: Secondary | ICD-10-CM | POA: Diagnosis not present

## 2016-04-18 DIAGNOSIS — Z79899 Other long term (current) drug therapy: Secondary | ICD-10-CM | POA: Diagnosis not present

## 2016-04-18 DIAGNOSIS — I4891 Unspecified atrial fibrillation: Secondary | ICD-10-CM | POA: Diagnosis not present

## 2016-04-18 DIAGNOSIS — X58XXXA Exposure to other specified factors, initial encounter: Secondary | ICD-10-CM | POA: Insufficient documentation

## 2016-04-18 DIAGNOSIS — E039 Hypothyroidism, unspecified: Secondary | ICD-10-CM | POA: Insufficient documentation

## 2016-04-18 DIAGNOSIS — Z9981 Dependence on supplemental oxygen: Secondary | ICD-10-CM | POA: Insufficient documentation

## 2016-04-18 DIAGNOSIS — F0390 Unspecified dementia without behavioral disturbance: Secondary | ICD-10-CM | POA: Diagnosis not present

## 2016-04-18 DIAGNOSIS — L89321 Pressure ulcer of left buttock, stage 1: Secondary | ICD-10-CM | POA: Diagnosis not present

## 2016-04-18 DIAGNOSIS — L89623 Pressure ulcer of left heel, stage 3: Secondary | ICD-10-CM | POA: Diagnosis not present

## 2016-04-18 DIAGNOSIS — S31821A Laceration without foreign body of left buttock, initial encounter: Secondary | ICD-10-CM | POA: Diagnosis not present

## 2016-04-18 DIAGNOSIS — Z794 Long term (current) use of insulin: Secondary | ICD-10-CM | POA: Diagnosis not present

## 2016-04-18 DIAGNOSIS — I5032 Chronic diastolic (congestive) heart failure: Secondary | ICD-10-CM | POA: Insufficient documentation

## 2016-04-18 DIAGNOSIS — E785 Hyperlipidemia, unspecified: Secondary | ICD-10-CM | POA: Diagnosis not present

## 2016-04-18 DIAGNOSIS — E1151 Type 2 diabetes mellitus with diabetic peripheral angiopathy without gangrene: Secondary | ICD-10-CM | POA: Diagnosis not present

## 2016-04-18 DIAGNOSIS — Z902 Acquired absence of lung [part of]: Secondary | ICD-10-CM | POA: Diagnosis not present

## 2016-04-18 DIAGNOSIS — Z992 Dependence on renal dialysis: Secondary | ICD-10-CM | POA: Diagnosis not present

## 2016-04-18 DIAGNOSIS — K219 Gastro-esophageal reflux disease without esophagitis: Secondary | ICD-10-CM | POA: Diagnosis not present

## 2016-04-18 DIAGNOSIS — E11621 Type 2 diabetes mellitus with foot ulcer: Secondary | ICD-10-CM | POA: Diagnosis not present

## 2016-04-18 DIAGNOSIS — I132 Hypertensive heart and chronic kidney disease with heart failure and with stage 5 chronic kidney disease, or end stage renal disease: Secondary | ICD-10-CM | POA: Diagnosis not present

## 2016-04-18 DIAGNOSIS — E1122 Type 2 diabetes mellitus with diabetic chronic kidney disease: Secondary | ICD-10-CM | POA: Diagnosis not present

## 2016-04-18 DIAGNOSIS — I70244 Atherosclerosis of native arteries of left leg with ulceration of heel and midfoot: Secondary | ICD-10-CM | POA: Diagnosis not present

## 2016-04-18 DIAGNOSIS — Z7901 Long term (current) use of anticoagulants: Secondary | ICD-10-CM | POA: Insufficient documentation

## 2016-04-19 DIAGNOSIS — N186 End stage renal disease: Secondary | ICD-10-CM | POA: Diagnosis not present

## 2016-04-19 DIAGNOSIS — E1129 Type 2 diabetes mellitus with other diabetic kidney complication: Secondary | ICD-10-CM | POA: Diagnosis not present

## 2016-04-19 DIAGNOSIS — D509 Iron deficiency anemia, unspecified: Secondary | ICD-10-CM | POA: Diagnosis not present

## 2016-04-19 DIAGNOSIS — N2581 Secondary hyperparathyroidism of renal origin: Secondary | ICD-10-CM | POA: Diagnosis not present

## 2016-04-19 DIAGNOSIS — D631 Anemia in chronic kidney disease: Secondary | ICD-10-CM | POA: Diagnosis not present

## 2016-04-20 ENCOUNTER — Other Ambulatory Visit: Payer: Self-pay | Admitting: Surgery

## 2016-04-20 ENCOUNTER — Ambulatory Visit (HOSPITAL_COMMUNITY)
Admission: RE | Admit: 2016-04-20 | Discharge: 2016-04-20 | Disposition: A | Discharge status: Home / Self Care | Payer: Medicare Other | Source: Ambulatory Visit | Attending: Surgery | Admitting: Surgery

## 2016-04-20 DIAGNOSIS — L97529 Non-pressure chronic ulcer of other part of left foot with unspecified severity: Secondary | ICD-10-CM | POA: Diagnosis not present

## 2016-04-20 DIAGNOSIS — E11621 Type 2 diabetes mellitus with foot ulcer: Secondary | ICD-10-CM | POA: Insufficient documentation

## 2016-04-20 DIAGNOSIS — M869 Osteomyelitis, unspecified: Secondary | ICD-10-CM

## 2016-04-21 DIAGNOSIS — D509 Iron deficiency anemia, unspecified: Secondary | ICD-10-CM | POA: Diagnosis not present

## 2016-04-21 DIAGNOSIS — D631 Anemia in chronic kidney disease: Secondary | ICD-10-CM | POA: Diagnosis not present

## 2016-04-21 DIAGNOSIS — E1129 Type 2 diabetes mellitus with other diabetic kidney complication: Secondary | ICD-10-CM | POA: Diagnosis not present

## 2016-04-21 DIAGNOSIS — N186 End stage renal disease: Secondary | ICD-10-CM | POA: Diagnosis not present

## 2016-04-21 DIAGNOSIS — N2581 Secondary hyperparathyroidism of renal origin: Secondary | ICD-10-CM | POA: Diagnosis not present

## 2016-04-24 DIAGNOSIS — N2581 Secondary hyperparathyroidism of renal origin: Secondary | ICD-10-CM | POA: Diagnosis not present

## 2016-04-24 DIAGNOSIS — D631 Anemia in chronic kidney disease: Secondary | ICD-10-CM | POA: Diagnosis not present

## 2016-04-24 DIAGNOSIS — N186 End stage renal disease: Secondary | ICD-10-CM | POA: Diagnosis not present

## 2016-04-24 DIAGNOSIS — E1129 Type 2 diabetes mellitus with other diabetic kidney complication: Secondary | ICD-10-CM | POA: Diagnosis not present

## 2016-04-24 DIAGNOSIS — D509 Iron deficiency anemia, unspecified: Secondary | ICD-10-CM | POA: Diagnosis not present

## 2016-04-25 DIAGNOSIS — Z992 Dependence on renal dialysis: Secondary | ICD-10-CM | POA: Diagnosis not present

## 2016-04-25 DIAGNOSIS — N186 End stage renal disease: Secondary | ICD-10-CM | POA: Diagnosis not present

## 2016-04-25 DIAGNOSIS — S31821A Laceration without foreign body of left buttock, initial encounter: Secondary | ICD-10-CM | POA: Diagnosis not present

## 2016-04-25 DIAGNOSIS — N185 Chronic kidney disease, stage 5: Secondary | ICD-10-CM | POA: Diagnosis not present

## 2016-04-25 DIAGNOSIS — E1151 Type 2 diabetes mellitus with diabetic peripheral angiopathy without gangrene: Secondary | ICD-10-CM | POA: Diagnosis not present

## 2016-04-25 DIAGNOSIS — I10 Essential (primary) hypertension: Secondary | ICD-10-CM | POA: Diagnosis not present

## 2016-04-25 DIAGNOSIS — L89623 Pressure ulcer of left heel, stage 3: Secondary | ICD-10-CM | POA: Diagnosis not present

## 2016-04-25 DIAGNOSIS — E1122 Type 2 diabetes mellitus with diabetic chronic kidney disease: Secondary | ICD-10-CM | POA: Diagnosis not present

## 2016-04-25 DIAGNOSIS — E11621 Type 2 diabetes mellitus with foot ulcer: Secondary | ICD-10-CM | POA: Diagnosis not present

## 2016-04-25 DIAGNOSIS — L89321 Pressure ulcer of left buttock, stage 1: Secondary | ICD-10-CM | POA: Diagnosis not present

## 2016-04-26 DIAGNOSIS — D631 Anemia in chronic kidney disease: Secondary | ICD-10-CM | POA: Diagnosis not present

## 2016-04-26 DIAGNOSIS — N2581 Secondary hyperparathyroidism of renal origin: Secondary | ICD-10-CM | POA: Diagnosis not present

## 2016-04-26 DIAGNOSIS — E1129 Type 2 diabetes mellitus with other diabetic kidney complication: Secondary | ICD-10-CM | POA: Diagnosis not present

## 2016-04-26 DIAGNOSIS — D509 Iron deficiency anemia, unspecified: Secondary | ICD-10-CM | POA: Diagnosis not present

## 2016-04-26 DIAGNOSIS — N186 End stage renal disease: Secondary | ICD-10-CM | POA: Diagnosis not present

## 2016-04-27 ENCOUNTER — Ambulatory Visit (INDEPENDENT_AMBULATORY_CARE_PROVIDER_SITE_OTHER): Payer: Medicare Other | Admitting: *Deleted

## 2016-04-27 DIAGNOSIS — Z5181 Encounter for therapeutic drug level monitoring: Secondary | ICD-10-CM | POA: Diagnosis not present

## 2016-04-27 LAB — POCT INR: INR: 2.4

## 2016-04-28 DIAGNOSIS — D631 Anemia in chronic kidney disease: Secondary | ICD-10-CM | POA: Diagnosis not present

## 2016-04-28 DIAGNOSIS — E1129 Type 2 diabetes mellitus with other diabetic kidney complication: Secondary | ICD-10-CM | POA: Diagnosis not present

## 2016-04-28 DIAGNOSIS — N2581 Secondary hyperparathyroidism of renal origin: Secondary | ICD-10-CM | POA: Diagnosis not present

## 2016-04-28 DIAGNOSIS — N186 End stage renal disease: Secondary | ICD-10-CM | POA: Diagnosis not present

## 2016-04-28 DIAGNOSIS — D509 Iron deficiency anemia, unspecified: Secondary | ICD-10-CM | POA: Diagnosis not present

## 2016-05-01 DIAGNOSIS — N186 End stage renal disease: Secondary | ICD-10-CM | POA: Diagnosis not present

## 2016-05-01 DIAGNOSIS — D509 Iron deficiency anemia, unspecified: Secondary | ICD-10-CM | POA: Diagnosis not present

## 2016-05-01 DIAGNOSIS — N2581 Secondary hyperparathyroidism of renal origin: Secondary | ICD-10-CM | POA: Diagnosis not present

## 2016-05-01 DIAGNOSIS — D631 Anemia in chronic kidney disease: Secondary | ICD-10-CM | POA: Diagnosis not present

## 2016-05-01 DIAGNOSIS — E1129 Type 2 diabetes mellitus with other diabetic kidney complication: Secondary | ICD-10-CM | POA: Diagnosis not present

## 2016-05-02 ENCOUNTER — Encounter (HOSPITAL_BASED_OUTPATIENT_CLINIC_OR_DEPARTMENT_OTHER): Payer: Medicare Other | Attending: Surgery

## 2016-05-02 DIAGNOSIS — I132 Hypertensive heart and chronic kidney disease with heart failure and with stage 5 chronic kidney disease, or end stage renal disease: Secondary | ICD-10-CM | POA: Diagnosis not present

## 2016-05-02 DIAGNOSIS — N186 End stage renal disease: Secondary | ICD-10-CM | POA: Insufficient documentation

## 2016-05-02 DIAGNOSIS — I70244 Atherosclerosis of native arteries of left leg with ulceration of heel and midfoot: Secondary | ICD-10-CM | POA: Diagnosis not present

## 2016-05-02 DIAGNOSIS — I509 Heart failure, unspecified: Secondary | ICD-10-CM | POA: Insufficient documentation

## 2016-05-02 DIAGNOSIS — M109 Gout, unspecified: Secondary | ICD-10-CM | POA: Insufficient documentation

## 2016-05-02 DIAGNOSIS — L89623 Pressure ulcer of left heel, stage 3: Secondary | ICD-10-CM | POA: Diagnosis not present

## 2016-05-02 DIAGNOSIS — I2510 Atherosclerotic heart disease of native coronary artery without angina pectoris: Secondary | ICD-10-CM | POA: Diagnosis not present

## 2016-05-02 DIAGNOSIS — F0390 Unspecified dementia without behavioral disturbance: Secondary | ICD-10-CM | POA: Insufficient documentation

## 2016-05-02 DIAGNOSIS — E1122 Type 2 diabetes mellitus with diabetic chronic kidney disease: Secondary | ICD-10-CM | POA: Insufficient documentation

## 2016-05-02 DIAGNOSIS — E11621 Type 2 diabetes mellitus with foot ulcer: Secondary | ICD-10-CM | POA: Insufficient documentation

## 2016-05-02 DIAGNOSIS — L89321 Pressure ulcer of left buttock, stage 1: Secondary | ICD-10-CM | POA: Diagnosis not present

## 2016-05-03 DIAGNOSIS — D509 Iron deficiency anemia, unspecified: Secondary | ICD-10-CM | POA: Diagnosis not present

## 2016-05-03 DIAGNOSIS — D631 Anemia in chronic kidney disease: Secondary | ICD-10-CM | POA: Diagnosis not present

## 2016-05-03 DIAGNOSIS — N2581 Secondary hyperparathyroidism of renal origin: Secondary | ICD-10-CM | POA: Diagnosis not present

## 2016-05-03 DIAGNOSIS — E1129 Type 2 diabetes mellitus with other diabetic kidney complication: Secondary | ICD-10-CM | POA: Diagnosis not present

## 2016-05-03 DIAGNOSIS — N186 End stage renal disease: Secondary | ICD-10-CM | POA: Diagnosis not present

## 2016-05-05 DIAGNOSIS — D631 Anemia in chronic kidney disease: Secondary | ICD-10-CM | POA: Diagnosis not present

## 2016-05-05 DIAGNOSIS — N186 End stage renal disease: Secondary | ICD-10-CM | POA: Diagnosis not present

## 2016-05-05 DIAGNOSIS — D509 Iron deficiency anemia, unspecified: Secondary | ICD-10-CM | POA: Diagnosis not present

## 2016-05-05 DIAGNOSIS — N2581 Secondary hyperparathyroidism of renal origin: Secondary | ICD-10-CM | POA: Diagnosis not present

## 2016-05-05 DIAGNOSIS — E1129 Type 2 diabetes mellitus with other diabetic kidney complication: Secondary | ICD-10-CM | POA: Diagnosis not present

## 2016-05-05 NOTE — Addendum Note (Signed)
Addended by: Mena Goes on: 05/05/2016 12:23 PM   Modules accepted: Orders

## 2016-05-08 DIAGNOSIS — D631 Anemia in chronic kidney disease: Secondary | ICD-10-CM | POA: Diagnosis not present

## 2016-05-08 DIAGNOSIS — N2581 Secondary hyperparathyroidism of renal origin: Secondary | ICD-10-CM | POA: Diagnosis not present

## 2016-05-08 DIAGNOSIS — D509 Iron deficiency anemia, unspecified: Secondary | ICD-10-CM | POA: Diagnosis not present

## 2016-05-08 DIAGNOSIS — N186 End stage renal disease: Secondary | ICD-10-CM | POA: Diagnosis not present

## 2016-05-08 DIAGNOSIS — E1129 Type 2 diabetes mellitus with other diabetic kidney complication: Secondary | ICD-10-CM | POA: Diagnosis not present

## 2016-05-09 ENCOUNTER — Encounter: Payer: Self-pay | Admitting: Podiatry

## 2016-05-09 ENCOUNTER — Ambulatory Visit (INDEPENDENT_AMBULATORY_CARE_PROVIDER_SITE_OTHER): Payer: Medicare Other | Admitting: Podiatry

## 2016-05-09 VITALS — BP 149/69 | HR 67 | Resp 14

## 2016-05-09 DIAGNOSIS — I70244 Atherosclerosis of native arteries of left leg with ulceration of heel and midfoot: Secondary | ICD-10-CM | POA: Diagnosis not present

## 2016-05-09 DIAGNOSIS — L89623 Pressure ulcer of left heel, stage 3: Secondary | ICD-10-CM | POA: Diagnosis not present

## 2016-05-09 DIAGNOSIS — I2510 Atherosclerotic heart disease of native coronary artery without angina pectoris: Secondary | ICD-10-CM | POA: Diagnosis not present

## 2016-05-09 DIAGNOSIS — L89321 Pressure ulcer of left buttock, stage 1: Secondary | ICD-10-CM | POA: Diagnosis not present

## 2016-05-09 DIAGNOSIS — E11621 Type 2 diabetes mellitus with foot ulcer: Secondary | ICD-10-CM | POA: Diagnosis not present

## 2016-05-09 DIAGNOSIS — E1151 Type 2 diabetes mellitus with diabetic peripheral angiopathy without gangrene: Secondary | ICD-10-CM

## 2016-05-09 DIAGNOSIS — B351 Tinea unguium: Secondary | ICD-10-CM

## 2016-05-09 DIAGNOSIS — M79676 Pain in unspecified toe(s): Secondary | ICD-10-CM

## 2016-05-09 DIAGNOSIS — I509 Heart failure, unspecified: Secondary | ICD-10-CM | POA: Diagnosis not present

## 2016-05-09 NOTE — Patient Instructions (Signed)

## 2016-05-09 NOTE — Progress Notes (Signed)
Patient ID: [name], male   DOB: 11/22/1933, 82 y.o.   MRN: [id number]  Subjective: This patient presents for a scheduled visit complaining that his toenails are thickened and elongated request toenail debridement. Patient also under care and wound care center for the past 3 weeks for skin ulcer on left heel. The patient's wife describes weekly application of a debriding enzyme and dressing and patient has scheduled visit for skin ulcer today Patient is a diabetic with a history of peripheral arterial disease  Objective: Patient is slow to respond DP right 2/4 DP left 1/4 PT pulses nonpalpable bilaterally Capillary reflex delayed bilaterally Ace wrap applied to left heel and ankle area for treatment of wound Patient wearing boot on right foot Plantar callus fifth MPJ right HAV right Hammertoe second bilaterally Toenails are elongated brittle, deformed and tender to direct palpation  Assessment: Diabetic with peripheral arterial disease Skin ulcer left under treatment of wound care center Symptomatic mycotic toenails 6-10  Plan: Debridement toenails 6-10 mechanically analytic without any bleeding  Reappoint 3 months

## 2016-05-10 ENCOUNTER — Telehealth: Payer: Self-pay | Admitting: Vascular Surgery

## 2016-05-10 DIAGNOSIS — N2581 Secondary hyperparathyroidism of renal origin: Secondary | ICD-10-CM | POA: Diagnosis not present

## 2016-05-10 DIAGNOSIS — E1129 Type 2 diabetes mellitus with other diabetic kidney complication: Secondary | ICD-10-CM | POA: Diagnosis not present

## 2016-05-10 DIAGNOSIS — D509 Iron deficiency anemia, unspecified: Secondary | ICD-10-CM | POA: Diagnosis not present

## 2016-05-10 DIAGNOSIS — D631 Anemia in chronic kidney disease: Secondary | ICD-10-CM | POA: Diagnosis not present

## 2016-05-10 DIAGNOSIS — N186 End stage renal disease: Secondary | ICD-10-CM | POA: Diagnosis not present

## 2016-05-10 NOTE — Telephone Encounter (Signed)
Our office already spoke to the pt's wife to sch and then resch appt to 07/13/16.

## 2016-05-10 NOTE — Telephone Encounter (Signed)
-----   Message from Mena Goes, RN sent at 05/10/2016  9:20 AM EST ----- Regarding: FW: Vascular consult  Contact: [phone]   ----- Message ----- From: Angelia Mould, MD Sent: 05/09/2016   6:05 PM To: Christin Fudge, MD, V[facility] Charge Pool Subject: RE: Vascular consult                           Errol, We will make this patients an appointment to see me in the office.  Thanks Gerald Stabs  ----- Message ----- From: Christin Fudge, MD Sent: 05/09/2016   4:14 PM To: Angelia Mould, MD Subject: Vascular consult                               Maricela Bo, I had asked for this patient to be reviewed by you for an opinion regarding a nonhealing wound to the left heel which he has had for over 5 months now. I had made the refferal through my office. The patients wife tells me she called your office for an appointment but someone on the phone told her that it would not be necessary to see you for this condition and no appointment was given. Reviewing the electronic medical records, the patient was last seen by Miss Nickel in July 2017. At that stage she had noted a shallow decubitus ulcer on the left heel but after reviewing the vascular studies done, she recommended a follow-up in 6 months with a carotid duplex and ABIs. She recommended maximal medical management of the underlying disease process.  Would you be kind enough to review this chart and if you think it is not appropriate to see him, I would be happy to continue with wound care and other symptomatic treatment. With regards, Christin Fudge

## 2016-05-12 DIAGNOSIS — N186 End stage renal disease: Secondary | ICD-10-CM | POA: Diagnosis not present

## 2016-05-12 DIAGNOSIS — D631 Anemia in chronic kidney disease: Secondary | ICD-10-CM | POA: Diagnosis not present

## 2016-05-12 DIAGNOSIS — N2581 Secondary hyperparathyroidism of renal origin: Secondary | ICD-10-CM | POA: Diagnosis not present

## 2016-05-12 DIAGNOSIS — D509 Iron deficiency anemia, unspecified: Secondary | ICD-10-CM | POA: Diagnosis not present

## 2016-05-12 DIAGNOSIS — E1129 Type 2 diabetes mellitus with other diabetic kidney complication: Secondary | ICD-10-CM | POA: Diagnosis not present

## 2016-05-15 DIAGNOSIS — E1129 Type 2 diabetes mellitus with other diabetic kidney complication: Secondary | ICD-10-CM | POA: Diagnosis not present

## 2016-05-15 DIAGNOSIS — D631 Anemia in chronic kidney disease: Secondary | ICD-10-CM | POA: Diagnosis not present

## 2016-05-15 DIAGNOSIS — N186 End stage renal disease: Secondary | ICD-10-CM | POA: Diagnosis not present

## 2016-05-15 DIAGNOSIS — N2581 Secondary hyperparathyroidism of renal origin: Secondary | ICD-10-CM | POA: Diagnosis not present

## 2016-05-15 DIAGNOSIS — D509 Iron deficiency anemia, unspecified: Secondary | ICD-10-CM | POA: Diagnosis not present

## 2016-05-16 DIAGNOSIS — N2581 Secondary hyperparathyroidism of renal origin: Secondary | ICD-10-CM | POA: Diagnosis not present

## 2016-05-16 DIAGNOSIS — D509 Iron deficiency anemia, unspecified: Secondary | ICD-10-CM | POA: Diagnosis not present

## 2016-05-16 DIAGNOSIS — N186 End stage renal disease: Secondary | ICD-10-CM | POA: Diagnosis not present

## 2016-05-16 DIAGNOSIS — D631 Anemia in chronic kidney disease: Secondary | ICD-10-CM | POA: Diagnosis not present

## 2016-05-16 DIAGNOSIS — E1129 Type 2 diabetes mellitus with other diabetic kidney complication: Secondary | ICD-10-CM | POA: Diagnosis not present

## 2016-05-17 DIAGNOSIS — E11621 Type 2 diabetes mellitus with foot ulcer: Secondary | ICD-10-CM | POA: Diagnosis not present

## 2016-05-17 DIAGNOSIS — I509 Heart failure, unspecified: Secondary | ICD-10-CM | POA: Diagnosis not present

## 2016-05-17 DIAGNOSIS — I70244 Atherosclerosis of native arteries of left leg with ulceration of heel and midfoot: Secondary | ICD-10-CM | POA: Diagnosis not present

## 2016-05-17 DIAGNOSIS — L89321 Pressure ulcer of left buttock, stage 1: Secondary | ICD-10-CM | POA: Diagnosis not present

## 2016-05-17 DIAGNOSIS — I2510 Atherosclerotic heart disease of native coronary artery without angina pectoris: Secondary | ICD-10-CM | POA: Diagnosis not present

## 2016-05-17 DIAGNOSIS — L89623 Pressure ulcer of left heel, stage 3: Secondary | ICD-10-CM | POA: Diagnosis not present

## 2016-05-19 DIAGNOSIS — N186 End stage renal disease: Secondary | ICD-10-CM | POA: Diagnosis not present

## 2016-05-19 DIAGNOSIS — E1129 Type 2 diabetes mellitus with other diabetic kidney complication: Secondary | ICD-10-CM | POA: Diagnosis not present

## 2016-05-19 DIAGNOSIS — D631 Anemia in chronic kidney disease: Secondary | ICD-10-CM | POA: Diagnosis not present

## 2016-05-19 DIAGNOSIS — N2581 Secondary hyperparathyroidism of renal origin: Secondary | ICD-10-CM | POA: Diagnosis not present

## 2016-05-19 DIAGNOSIS — D509 Iron deficiency anemia, unspecified: Secondary | ICD-10-CM | POA: Diagnosis not present

## 2016-05-22 DIAGNOSIS — E1129 Type 2 diabetes mellitus with other diabetic kidney complication: Secondary | ICD-10-CM | POA: Diagnosis not present

## 2016-05-22 DIAGNOSIS — D509 Iron deficiency anemia, unspecified: Secondary | ICD-10-CM | POA: Diagnosis not present

## 2016-05-22 DIAGNOSIS — N186 End stage renal disease: Secondary | ICD-10-CM | POA: Diagnosis not present

## 2016-05-22 DIAGNOSIS — D631 Anemia in chronic kidney disease: Secondary | ICD-10-CM | POA: Diagnosis not present

## 2016-05-22 DIAGNOSIS — N2581 Secondary hyperparathyroidism of renal origin: Secondary | ICD-10-CM | POA: Diagnosis not present

## 2016-05-24 DIAGNOSIS — E1129 Type 2 diabetes mellitus with other diabetic kidney complication: Secondary | ICD-10-CM | POA: Diagnosis not present

## 2016-05-24 DIAGNOSIS — N2581 Secondary hyperparathyroidism of renal origin: Secondary | ICD-10-CM | POA: Diagnosis not present

## 2016-05-24 DIAGNOSIS — D631 Anemia in chronic kidney disease: Secondary | ICD-10-CM | POA: Diagnosis not present

## 2016-05-24 DIAGNOSIS — D509 Iron deficiency anemia, unspecified: Secondary | ICD-10-CM | POA: Diagnosis not present

## 2016-05-24 DIAGNOSIS — N186 End stage renal disease: Secondary | ICD-10-CM | POA: Diagnosis not present

## 2016-05-25 ENCOUNTER — Ambulatory Visit (INDEPENDENT_AMBULATORY_CARE_PROVIDER_SITE_OTHER): Payer: Medicare Other | Admitting: *Deleted

## 2016-05-25 DIAGNOSIS — I779 Disorder of arteries and arterioles, unspecified: Secondary | ICD-10-CM | POA: Diagnosis not present

## 2016-05-25 DIAGNOSIS — Z5181 Encounter for therapeutic drug level monitoring: Secondary | ICD-10-CM

## 2016-05-25 DIAGNOSIS — E1122 Type 2 diabetes mellitus with diabetic chronic kidney disease: Secondary | ICD-10-CM | POA: Diagnosis not present

## 2016-05-25 DIAGNOSIS — N186 End stage renal disease: Secondary | ICD-10-CM | POA: Diagnosis not present

## 2016-05-25 DIAGNOSIS — Z992 Dependence on renal dialysis: Secondary | ICD-10-CM | POA: Diagnosis not present

## 2016-05-25 LAB — POCT INR: INR: 2.6

## 2016-05-26 DIAGNOSIS — E1122 Type 2 diabetes mellitus with diabetic chronic kidney disease: Secondary | ICD-10-CM | POA: Diagnosis not present

## 2016-05-26 DIAGNOSIS — N186 End stage renal disease: Secondary | ICD-10-CM | POA: Diagnosis not present

## 2016-05-26 DIAGNOSIS — D631 Anemia in chronic kidney disease: Secondary | ICD-10-CM | POA: Diagnosis not present

## 2016-05-26 DIAGNOSIS — N2581 Secondary hyperparathyroidism of renal origin: Secondary | ICD-10-CM | POA: Diagnosis not present

## 2016-05-26 DIAGNOSIS — D509 Iron deficiency anemia, unspecified: Secondary | ICD-10-CM | POA: Diagnosis not present

## 2016-05-26 DIAGNOSIS — E1129 Type 2 diabetes mellitus with other diabetic kidney complication: Secondary | ICD-10-CM | POA: Diagnosis not present

## 2016-05-29 DIAGNOSIS — D631 Anemia in chronic kidney disease: Secondary | ICD-10-CM | POA: Diagnosis not present

## 2016-05-29 DIAGNOSIS — N186 End stage renal disease: Secondary | ICD-10-CM | POA: Diagnosis not present

## 2016-05-29 DIAGNOSIS — D509 Iron deficiency anemia, unspecified: Secondary | ICD-10-CM | POA: Diagnosis not present

## 2016-05-29 DIAGNOSIS — E1129 Type 2 diabetes mellitus with other diabetic kidney complication: Secondary | ICD-10-CM | POA: Diagnosis not present

## 2016-05-29 DIAGNOSIS — E1122 Type 2 diabetes mellitus with diabetic chronic kidney disease: Secondary | ICD-10-CM | POA: Diagnosis not present

## 2016-05-29 DIAGNOSIS — N2581 Secondary hyperparathyroidism of renal origin: Secondary | ICD-10-CM | POA: Diagnosis not present

## 2016-05-30 ENCOUNTER — Encounter (HOSPITAL_BASED_OUTPATIENT_CLINIC_OR_DEPARTMENT_OTHER): Payer: Medicare Other | Attending: Surgery

## 2016-05-30 DIAGNOSIS — Z902 Acquired absence of lung [part of]: Secondary | ICD-10-CM | POA: Insufficient documentation

## 2016-05-30 DIAGNOSIS — E1151 Type 2 diabetes mellitus with diabetic peripheral angiopathy without gangrene: Secondary | ICD-10-CM | POA: Insufficient documentation

## 2016-05-30 DIAGNOSIS — L97422 Non-pressure chronic ulcer of left heel and midfoot with fat layer exposed: Secondary | ICD-10-CM | POA: Diagnosis not present

## 2016-05-30 DIAGNOSIS — Z992 Dependence on renal dialysis: Secondary | ICD-10-CM | POA: Insufficient documentation

## 2016-05-30 DIAGNOSIS — I120 Hypertensive chronic kidney disease with stage 5 chronic kidney disease or end stage renal disease: Secondary | ICD-10-CM | POA: Diagnosis not present

## 2016-05-30 DIAGNOSIS — L89623 Pressure ulcer of left heel, stage 3: Secondary | ICD-10-CM | POA: Diagnosis not present

## 2016-05-30 DIAGNOSIS — F0390 Unspecified dementia without behavioral disturbance: Secondary | ICD-10-CM | POA: Insufficient documentation

## 2016-05-30 DIAGNOSIS — E11621 Type 2 diabetes mellitus with foot ulcer: Secondary | ICD-10-CM | POA: Insufficient documentation

## 2016-05-30 DIAGNOSIS — I2510 Atherosclerotic heart disease of native coronary artery without angina pectoris: Secondary | ICD-10-CM | POA: Diagnosis not present

## 2016-05-30 DIAGNOSIS — I871 Compression of vein: Secondary | ICD-10-CM | POA: Diagnosis not present

## 2016-05-30 DIAGNOSIS — I4891 Unspecified atrial fibrillation: Secondary | ICD-10-CM | POA: Insufficient documentation

## 2016-05-30 DIAGNOSIS — N186 End stage renal disease: Secondary | ICD-10-CM | POA: Diagnosis not present

## 2016-05-30 DIAGNOSIS — E1122 Type 2 diabetes mellitus with diabetic chronic kidney disease: Secondary | ICD-10-CM | POA: Diagnosis not present

## 2016-05-30 DIAGNOSIS — I70244 Atherosclerosis of native arteries of left leg with ulceration of heel and midfoot: Secondary | ICD-10-CM | POA: Insufficient documentation

## 2016-05-30 DIAGNOSIS — T82858D Stenosis of vascular prosthetic devices, implants and grafts, subsequent encounter: Secondary | ICD-10-CM | POA: Diagnosis not present

## 2016-05-31 DIAGNOSIS — E1122 Type 2 diabetes mellitus with diabetic chronic kidney disease: Secondary | ICD-10-CM | POA: Diagnosis not present

## 2016-05-31 DIAGNOSIS — N2581 Secondary hyperparathyroidism of renal origin: Secondary | ICD-10-CM | POA: Diagnosis not present

## 2016-05-31 DIAGNOSIS — D631 Anemia in chronic kidney disease: Secondary | ICD-10-CM | POA: Diagnosis not present

## 2016-05-31 DIAGNOSIS — E1129 Type 2 diabetes mellitus with other diabetic kidney complication: Secondary | ICD-10-CM | POA: Diagnosis not present

## 2016-05-31 DIAGNOSIS — D509 Iron deficiency anemia, unspecified: Secondary | ICD-10-CM | POA: Diagnosis not present

## 2016-05-31 DIAGNOSIS — N186 End stage renal disease: Secondary | ICD-10-CM | POA: Diagnosis not present

## 2016-06-02 DIAGNOSIS — D509 Iron deficiency anemia, unspecified: Secondary | ICD-10-CM | POA: Diagnosis not present

## 2016-06-02 DIAGNOSIS — E1122 Type 2 diabetes mellitus with diabetic chronic kidney disease: Secondary | ICD-10-CM | POA: Diagnosis not present

## 2016-06-02 DIAGNOSIS — D631 Anemia in chronic kidney disease: Secondary | ICD-10-CM | POA: Diagnosis not present

## 2016-06-02 DIAGNOSIS — N186 End stage renal disease: Secondary | ICD-10-CM | POA: Diagnosis not present

## 2016-06-02 DIAGNOSIS — N2581 Secondary hyperparathyroidism of renal origin: Secondary | ICD-10-CM | POA: Diagnosis not present

## 2016-06-02 DIAGNOSIS — E1129 Type 2 diabetes mellitus with other diabetic kidney complication: Secondary | ICD-10-CM | POA: Diagnosis not present

## 2016-06-05 DIAGNOSIS — N186 End stage renal disease: Secondary | ICD-10-CM | POA: Diagnosis not present

## 2016-06-05 DIAGNOSIS — E1122 Type 2 diabetes mellitus with diabetic chronic kidney disease: Secondary | ICD-10-CM | POA: Diagnosis not present

## 2016-06-05 DIAGNOSIS — N2581 Secondary hyperparathyroidism of renal origin: Secondary | ICD-10-CM | POA: Diagnosis not present

## 2016-06-05 DIAGNOSIS — D509 Iron deficiency anemia, unspecified: Secondary | ICD-10-CM | POA: Diagnosis not present

## 2016-06-05 DIAGNOSIS — E1129 Type 2 diabetes mellitus with other diabetic kidney complication: Secondary | ICD-10-CM | POA: Diagnosis not present

## 2016-06-05 DIAGNOSIS — D631 Anemia in chronic kidney disease: Secondary | ICD-10-CM | POA: Diagnosis not present

## 2016-06-06 DIAGNOSIS — I2510 Atherosclerotic heart disease of native coronary artery without angina pectoris: Secondary | ICD-10-CM | POA: Diagnosis not present

## 2016-06-06 DIAGNOSIS — L97422 Non-pressure chronic ulcer of left heel and midfoot with fat layer exposed: Secondary | ICD-10-CM | POA: Diagnosis not present

## 2016-06-06 DIAGNOSIS — I70244 Atherosclerosis of native arteries of left leg with ulceration of heel and midfoot: Secondary | ICD-10-CM | POA: Diagnosis not present

## 2016-06-06 DIAGNOSIS — E1151 Type 2 diabetes mellitus with diabetic peripheral angiopathy without gangrene: Secondary | ICD-10-CM | POA: Diagnosis not present

## 2016-06-06 DIAGNOSIS — I120 Hypertensive chronic kidney disease with stage 5 chronic kidney disease or end stage renal disease: Secondary | ICD-10-CM | POA: Diagnosis not present

## 2016-06-06 DIAGNOSIS — L89623 Pressure ulcer of left heel, stage 3: Secondary | ICD-10-CM | POA: Diagnosis not present

## 2016-06-06 DIAGNOSIS — E11621 Type 2 diabetes mellitus with foot ulcer: Secondary | ICD-10-CM | POA: Diagnosis not present

## 2016-06-07 DIAGNOSIS — N2581 Secondary hyperparathyroidism of renal origin: Secondary | ICD-10-CM | POA: Diagnosis not present

## 2016-06-07 DIAGNOSIS — N186 End stage renal disease: Secondary | ICD-10-CM | POA: Diagnosis not present

## 2016-06-07 DIAGNOSIS — D631 Anemia in chronic kidney disease: Secondary | ICD-10-CM | POA: Diagnosis not present

## 2016-06-07 DIAGNOSIS — E1129 Type 2 diabetes mellitus with other diabetic kidney complication: Secondary | ICD-10-CM | POA: Diagnosis not present

## 2016-06-07 DIAGNOSIS — D509 Iron deficiency anemia, unspecified: Secondary | ICD-10-CM | POA: Diagnosis not present

## 2016-06-07 DIAGNOSIS — E1122 Type 2 diabetes mellitus with diabetic chronic kidney disease: Secondary | ICD-10-CM | POA: Diagnosis not present

## 2016-06-09 DIAGNOSIS — E1122 Type 2 diabetes mellitus with diabetic chronic kidney disease: Secondary | ICD-10-CM | POA: Diagnosis not present

## 2016-06-09 DIAGNOSIS — N186 End stage renal disease: Secondary | ICD-10-CM | POA: Diagnosis not present

## 2016-06-09 DIAGNOSIS — D509 Iron deficiency anemia, unspecified: Secondary | ICD-10-CM | POA: Diagnosis not present

## 2016-06-09 DIAGNOSIS — E1129 Type 2 diabetes mellitus with other diabetic kidney complication: Secondary | ICD-10-CM | POA: Diagnosis not present

## 2016-06-09 DIAGNOSIS — N2581 Secondary hyperparathyroidism of renal origin: Secondary | ICD-10-CM | POA: Diagnosis not present

## 2016-06-09 DIAGNOSIS — D631 Anemia in chronic kidney disease: Secondary | ICD-10-CM | POA: Diagnosis not present

## 2016-06-12 DIAGNOSIS — N2581 Secondary hyperparathyroidism of renal origin: Secondary | ICD-10-CM | POA: Diagnosis not present

## 2016-06-12 DIAGNOSIS — D509 Iron deficiency anemia, unspecified: Secondary | ICD-10-CM | POA: Diagnosis not present

## 2016-06-12 DIAGNOSIS — D631 Anemia in chronic kidney disease: Secondary | ICD-10-CM | POA: Diagnosis not present

## 2016-06-12 DIAGNOSIS — N186 End stage renal disease: Secondary | ICD-10-CM | POA: Diagnosis not present

## 2016-06-12 DIAGNOSIS — E1122 Type 2 diabetes mellitus with diabetic chronic kidney disease: Secondary | ICD-10-CM | POA: Diagnosis not present

## 2016-06-12 DIAGNOSIS — E1129 Type 2 diabetes mellitus with other diabetic kidney complication: Secondary | ICD-10-CM | POA: Diagnosis not present

## 2016-06-13 DIAGNOSIS — I120 Hypertensive chronic kidney disease with stage 5 chronic kidney disease or end stage renal disease: Secondary | ICD-10-CM | POA: Diagnosis not present

## 2016-06-13 DIAGNOSIS — L89321 Pressure ulcer of left buttock, stage 1: Secondary | ICD-10-CM | POA: Diagnosis not present

## 2016-06-13 DIAGNOSIS — L97422 Non-pressure chronic ulcer of left heel and midfoot with fat layer exposed: Secondary | ICD-10-CM | POA: Diagnosis not present

## 2016-06-13 DIAGNOSIS — E11621 Type 2 diabetes mellitus with foot ulcer: Secondary | ICD-10-CM | POA: Diagnosis not present

## 2016-06-13 DIAGNOSIS — I70244 Atherosclerosis of native arteries of left leg with ulceration of heel and midfoot: Secondary | ICD-10-CM | POA: Diagnosis not present

## 2016-06-13 DIAGNOSIS — I2510 Atherosclerotic heart disease of native coronary artery without angina pectoris: Secondary | ICD-10-CM | POA: Diagnosis not present

## 2016-06-13 DIAGNOSIS — L89623 Pressure ulcer of left heel, stage 3: Secondary | ICD-10-CM | POA: Diagnosis not present

## 2016-06-13 DIAGNOSIS — E1151 Type 2 diabetes mellitus with diabetic peripheral angiopathy without gangrene: Secondary | ICD-10-CM | POA: Diagnosis not present

## 2016-06-14 DIAGNOSIS — E1122 Type 2 diabetes mellitus with diabetic chronic kidney disease: Secondary | ICD-10-CM | POA: Diagnosis not present

## 2016-06-14 DIAGNOSIS — E1129 Type 2 diabetes mellitus with other diabetic kidney complication: Secondary | ICD-10-CM | POA: Diagnosis not present

## 2016-06-14 DIAGNOSIS — D631 Anemia in chronic kidney disease: Secondary | ICD-10-CM | POA: Diagnosis not present

## 2016-06-14 DIAGNOSIS — N186 End stage renal disease: Secondary | ICD-10-CM | POA: Diagnosis not present

## 2016-06-14 DIAGNOSIS — D509 Iron deficiency anemia, unspecified: Secondary | ICD-10-CM | POA: Diagnosis not present

## 2016-06-14 DIAGNOSIS — N2581 Secondary hyperparathyroidism of renal origin: Secondary | ICD-10-CM | POA: Diagnosis not present

## 2016-06-16 DIAGNOSIS — N2581 Secondary hyperparathyroidism of renal origin: Secondary | ICD-10-CM | POA: Diagnosis not present

## 2016-06-16 DIAGNOSIS — E1122 Type 2 diabetes mellitus with diabetic chronic kidney disease: Secondary | ICD-10-CM | POA: Diagnosis not present

## 2016-06-16 DIAGNOSIS — N186 End stage renal disease: Secondary | ICD-10-CM | POA: Diagnosis not present

## 2016-06-16 DIAGNOSIS — E1129 Type 2 diabetes mellitus with other diabetic kidney complication: Secondary | ICD-10-CM | POA: Diagnosis not present

## 2016-06-16 DIAGNOSIS — D509 Iron deficiency anemia, unspecified: Secondary | ICD-10-CM | POA: Diagnosis not present

## 2016-06-16 DIAGNOSIS — D631 Anemia in chronic kidney disease: Secondary | ICD-10-CM | POA: Diagnosis not present

## 2016-06-20 DIAGNOSIS — E1129 Type 2 diabetes mellitus with other diabetic kidney complication: Secondary | ICD-10-CM | POA: Diagnosis not present

## 2016-06-20 DIAGNOSIS — N2581 Secondary hyperparathyroidism of renal origin: Secondary | ICD-10-CM | POA: Diagnosis not present

## 2016-06-20 DIAGNOSIS — N186 End stage renal disease: Secondary | ICD-10-CM | POA: Diagnosis not present

## 2016-06-20 DIAGNOSIS — D631 Anemia in chronic kidney disease: Secondary | ICD-10-CM | POA: Diagnosis not present

## 2016-06-20 DIAGNOSIS — D509 Iron deficiency anemia, unspecified: Secondary | ICD-10-CM | POA: Diagnosis not present

## 2016-06-20 DIAGNOSIS — E1122 Type 2 diabetes mellitus with diabetic chronic kidney disease: Secondary | ICD-10-CM | POA: Diagnosis not present

## 2016-06-21 DIAGNOSIS — N186 End stage renal disease: Secondary | ICD-10-CM | POA: Diagnosis not present

## 2016-06-21 DIAGNOSIS — D509 Iron deficiency anemia, unspecified: Secondary | ICD-10-CM | POA: Diagnosis not present

## 2016-06-21 DIAGNOSIS — E1122 Type 2 diabetes mellitus with diabetic chronic kidney disease: Secondary | ICD-10-CM | POA: Diagnosis not present

## 2016-06-21 DIAGNOSIS — N2581 Secondary hyperparathyroidism of renal origin: Secondary | ICD-10-CM | POA: Diagnosis not present

## 2016-06-21 DIAGNOSIS — D631 Anemia in chronic kidney disease: Secondary | ICD-10-CM | POA: Diagnosis not present

## 2016-06-21 DIAGNOSIS — E1129 Type 2 diabetes mellitus with other diabetic kidney complication: Secondary | ICD-10-CM | POA: Diagnosis not present

## 2016-06-22 ENCOUNTER — Ambulatory Visit (INDEPENDENT_AMBULATORY_CARE_PROVIDER_SITE_OTHER): Payer: Medicare Other | Admitting: *Deleted

## 2016-06-22 DIAGNOSIS — I70244 Atherosclerosis of native arteries of left leg with ulceration of heel and midfoot: Secondary | ICD-10-CM | POA: Diagnosis not present

## 2016-06-22 DIAGNOSIS — L97422 Non-pressure chronic ulcer of left heel and midfoot with fat layer exposed: Secondary | ICD-10-CM | POA: Diagnosis not present

## 2016-06-22 DIAGNOSIS — L89623 Pressure ulcer of left heel, stage 3: Secondary | ICD-10-CM | POA: Diagnosis not present

## 2016-06-22 DIAGNOSIS — I2510 Atherosclerotic heart disease of native coronary artery without angina pectoris: Secondary | ICD-10-CM | POA: Diagnosis not present

## 2016-06-22 DIAGNOSIS — I779 Disorder of arteries and arterioles, unspecified: Secondary | ICD-10-CM | POA: Diagnosis not present

## 2016-06-22 DIAGNOSIS — Z5181 Encounter for therapeutic drug level monitoring: Secondary | ICD-10-CM

## 2016-06-22 DIAGNOSIS — E1151 Type 2 diabetes mellitus with diabetic peripheral angiopathy without gangrene: Secondary | ICD-10-CM | POA: Diagnosis not present

## 2016-06-22 DIAGNOSIS — I120 Hypertensive chronic kidney disease with stage 5 chronic kidney disease or end stage renal disease: Secondary | ICD-10-CM | POA: Diagnosis not present

## 2016-06-22 DIAGNOSIS — E11621 Type 2 diabetes mellitus with foot ulcer: Secondary | ICD-10-CM | POA: Diagnosis not present

## 2016-06-22 LAB — POCT INR: INR: 2.8

## 2016-06-23 DIAGNOSIS — E1122 Type 2 diabetes mellitus with diabetic chronic kidney disease: Secondary | ICD-10-CM | POA: Diagnosis not present

## 2016-06-23 DIAGNOSIS — N186 End stage renal disease: Secondary | ICD-10-CM | POA: Diagnosis not present

## 2016-06-23 DIAGNOSIS — E1129 Type 2 diabetes mellitus with other diabetic kidney complication: Secondary | ICD-10-CM | POA: Diagnosis not present

## 2016-06-23 DIAGNOSIS — D509 Iron deficiency anemia, unspecified: Secondary | ICD-10-CM | POA: Diagnosis not present

## 2016-06-23 DIAGNOSIS — D631 Anemia in chronic kidney disease: Secondary | ICD-10-CM | POA: Diagnosis not present

## 2016-06-23 DIAGNOSIS — N2581 Secondary hyperparathyroidism of renal origin: Secondary | ICD-10-CM | POA: Diagnosis not present

## 2016-06-25 DIAGNOSIS — Z992 Dependence on renal dialysis: Secondary | ICD-10-CM | POA: Diagnosis not present

## 2016-06-25 DIAGNOSIS — N186 End stage renal disease: Secondary | ICD-10-CM | POA: Diagnosis not present

## 2016-06-25 DIAGNOSIS — E1122 Type 2 diabetes mellitus with diabetic chronic kidney disease: Secondary | ICD-10-CM | POA: Diagnosis not present

## 2016-06-27 DIAGNOSIS — E1129 Type 2 diabetes mellitus with other diabetic kidney complication: Secondary | ICD-10-CM | POA: Diagnosis not present

## 2016-06-27 DIAGNOSIS — N2581 Secondary hyperparathyroidism of renal origin: Secondary | ICD-10-CM | POA: Diagnosis not present

## 2016-06-27 DIAGNOSIS — D509 Iron deficiency anemia, unspecified: Secondary | ICD-10-CM | POA: Diagnosis not present

## 2016-06-27 DIAGNOSIS — N186 End stage renal disease: Secondary | ICD-10-CM | POA: Diagnosis not present

## 2016-06-27 DIAGNOSIS — D696 Thrombocytopenia, unspecified: Secondary | ICD-10-CM | POA: Diagnosis not present

## 2016-06-30 DIAGNOSIS — N186 End stage renal disease: Secondary | ICD-10-CM | POA: Diagnosis not present

## 2016-06-30 DIAGNOSIS — D509 Iron deficiency anemia, unspecified: Secondary | ICD-10-CM | POA: Diagnosis not present

## 2016-06-30 DIAGNOSIS — D696 Thrombocytopenia, unspecified: Secondary | ICD-10-CM | POA: Diagnosis not present

## 2016-06-30 DIAGNOSIS — E1129 Type 2 diabetes mellitus with other diabetic kidney complication: Secondary | ICD-10-CM | POA: Diagnosis not present

## 2016-06-30 DIAGNOSIS — N2581 Secondary hyperparathyroidism of renal origin: Secondary | ICD-10-CM | POA: Diagnosis not present

## 2016-07-01 ENCOUNTER — Other Ambulatory Visit: Payer: Self-pay | Admitting: Cardiovascular Disease

## 2016-07-03 DIAGNOSIS — E1129 Type 2 diabetes mellitus with other diabetic kidney complication: Secondary | ICD-10-CM | POA: Diagnosis not present

## 2016-07-03 DIAGNOSIS — D509 Iron deficiency anemia, unspecified: Secondary | ICD-10-CM | POA: Diagnosis not present

## 2016-07-03 DIAGNOSIS — N2581 Secondary hyperparathyroidism of renal origin: Secondary | ICD-10-CM | POA: Diagnosis not present

## 2016-07-03 DIAGNOSIS — N186 End stage renal disease: Secondary | ICD-10-CM | POA: Diagnosis not present

## 2016-07-03 DIAGNOSIS — D696 Thrombocytopenia, unspecified: Secondary | ICD-10-CM | POA: Diagnosis not present

## 2016-07-04 ENCOUNTER — Encounter (HOSPITAL_BASED_OUTPATIENT_CLINIC_OR_DEPARTMENT_OTHER): Payer: Medicare Other | Attending: Surgery

## 2016-07-04 DIAGNOSIS — I509 Heart failure, unspecified: Secondary | ICD-10-CM | POA: Insufficient documentation

## 2016-07-04 DIAGNOSIS — I2510 Atherosclerotic heart disease of native coronary artery without angina pectoris: Secondary | ICD-10-CM | POA: Diagnosis not present

## 2016-07-04 DIAGNOSIS — L89623 Pressure ulcer of left heel, stage 3: Secondary | ICD-10-CM | POA: Diagnosis not present

## 2016-07-04 DIAGNOSIS — I132 Hypertensive heart and chronic kidney disease with heart failure and with stage 5 chronic kidney disease, or end stage renal disease: Secondary | ICD-10-CM | POA: Insufficient documentation

## 2016-07-04 DIAGNOSIS — F0390 Unspecified dementia without behavioral disturbance: Secondary | ICD-10-CM | POA: Insufficient documentation

## 2016-07-04 DIAGNOSIS — E1122 Type 2 diabetes mellitus with diabetic chronic kidney disease: Secondary | ICD-10-CM | POA: Diagnosis not present

## 2016-07-04 DIAGNOSIS — M109 Gout, unspecified: Secondary | ICD-10-CM | POA: Diagnosis not present

## 2016-07-04 DIAGNOSIS — N186 End stage renal disease: Secondary | ICD-10-CM | POA: Insufficient documentation

## 2016-07-04 DIAGNOSIS — I70244 Atherosclerosis of native arteries of left leg with ulceration of heel and midfoot: Secondary | ICD-10-CM | POA: Insufficient documentation

## 2016-07-04 DIAGNOSIS — E11621 Type 2 diabetes mellitus with foot ulcer: Secondary | ICD-10-CM | POA: Diagnosis not present

## 2016-07-04 DIAGNOSIS — L89321 Pressure ulcer of left buttock, stage 1: Secondary | ICD-10-CM | POA: Diagnosis not present

## 2016-07-05 DIAGNOSIS — E1122 Type 2 diabetes mellitus with diabetic chronic kidney disease: Secondary | ICD-10-CM | POA: Diagnosis not present

## 2016-07-05 DIAGNOSIS — D509 Iron deficiency anemia, unspecified: Secondary | ICD-10-CM | POA: Diagnosis not present

## 2016-07-05 DIAGNOSIS — N2581 Secondary hyperparathyroidism of renal origin: Secondary | ICD-10-CM | POA: Diagnosis not present

## 2016-07-05 DIAGNOSIS — N186 End stage renal disease: Secondary | ICD-10-CM | POA: Diagnosis not present

## 2016-07-05 DIAGNOSIS — D696 Thrombocytopenia, unspecified: Secondary | ICD-10-CM | POA: Diagnosis not present

## 2016-07-05 DIAGNOSIS — E1129 Type 2 diabetes mellitus with other diabetic kidney complication: Secondary | ICD-10-CM | POA: Diagnosis not present

## 2016-07-07 DIAGNOSIS — N2581 Secondary hyperparathyroidism of renal origin: Secondary | ICD-10-CM | POA: Diagnosis not present

## 2016-07-07 DIAGNOSIS — E1129 Type 2 diabetes mellitus with other diabetic kidney complication: Secondary | ICD-10-CM | POA: Diagnosis not present

## 2016-07-07 DIAGNOSIS — D509 Iron deficiency anemia, unspecified: Secondary | ICD-10-CM | POA: Diagnosis not present

## 2016-07-07 DIAGNOSIS — N186 End stage renal disease: Secondary | ICD-10-CM | POA: Diagnosis not present

## 2016-07-07 DIAGNOSIS — D696 Thrombocytopenia, unspecified: Secondary | ICD-10-CM | POA: Diagnosis not present

## 2016-07-10 DIAGNOSIS — D509 Iron deficiency anemia, unspecified: Secondary | ICD-10-CM | POA: Diagnosis not present

## 2016-07-10 DIAGNOSIS — E1129 Type 2 diabetes mellitus with other diabetic kidney complication: Secondary | ICD-10-CM | POA: Diagnosis not present

## 2016-07-10 DIAGNOSIS — N186 End stage renal disease: Secondary | ICD-10-CM | POA: Diagnosis not present

## 2016-07-10 DIAGNOSIS — D696 Thrombocytopenia, unspecified: Secondary | ICD-10-CM | POA: Diagnosis not present

## 2016-07-10 DIAGNOSIS — N2581 Secondary hyperparathyroidism of renal origin: Secondary | ICD-10-CM | POA: Diagnosis not present

## 2016-07-11 ENCOUNTER — Encounter: Payer: Self-pay | Admitting: Family

## 2016-07-11 DIAGNOSIS — E11621 Type 2 diabetes mellitus with foot ulcer: Secondary | ICD-10-CM | POA: Diagnosis not present

## 2016-07-11 DIAGNOSIS — I132 Hypertensive heart and chronic kidney disease with heart failure and with stage 5 chronic kidney disease, or end stage renal disease: Secondary | ICD-10-CM | POA: Diagnosis not present

## 2016-07-11 DIAGNOSIS — L89623 Pressure ulcer of left heel, stage 3: Secondary | ICD-10-CM | POA: Diagnosis not present

## 2016-07-11 DIAGNOSIS — E1122 Type 2 diabetes mellitus with diabetic chronic kidney disease: Secondary | ICD-10-CM | POA: Diagnosis not present

## 2016-07-11 DIAGNOSIS — L89321 Pressure ulcer of left buttock, stage 1: Secondary | ICD-10-CM | POA: Diagnosis not present

## 2016-07-11 DIAGNOSIS — I70244 Atherosclerosis of native arteries of left leg with ulceration of heel and midfoot: Secondary | ICD-10-CM | POA: Diagnosis not present

## 2016-07-13 ENCOUNTER — Ambulatory Visit (HOSPITAL_COMMUNITY): Payer: Medicare Other

## 2016-07-13 ENCOUNTER — Ambulatory Visit: Payer: Medicare Other | Admitting: Family

## 2016-07-14 ENCOUNTER — Ambulatory Visit: Payer: Medicare Other | Admitting: Family

## 2016-07-14 ENCOUNTER — Encounter (HOSPITAL_COMMUNITY): Payer: Medicare Other

## 2016-07-14 DIAGNOSIS — N2581 Secondary hyperparathyroidism of renal origin: Secondary | ICD-10-CM | POA: Diagnosis not present

## 2016-07-14 DIAGNOSIS — E1129 Type 2 diabetes mellitus with other diabetic kidney complication: Secondary | ICD-10-CM | POA: Diagnosis not present

## 2016-07-14 DIAGNOSIS — N186 End stage renal disease: Secondary | ICD-10-CM | POA: Diagnosis not present

## 2016-07-14 DIAGNOSIS — D696 Thrombocytopenia, unspecified: Secondary | ICD-10-CM | POA: Diagnosis not present

## 2016-07-14 DIAGNOSIS — D509 Iron deficiency anemia, unspecified: Secondary | ICD-10-CM | POA: Diagnosis not present

## 2016-07-17 DIAGNOSIS — N186 End stage renal disease: Secondary | ICD-10-CM | POA: Diagnosis not present

## 2016-07-17 DIAGNOSIS — E1129 Type 2 diabetes mellitus with other diabetic kidney complication: Secondary | ICD-10-CM | POA: Diagnosis not present

## 2016-07-17 DIAGNOSIS — N2581 Secondary hyperparathyroidism of renal origin: Secondary | ICD-10-CM | POA: Diagnosis not present

## 2016-07-17 DIAGNOSIS — D696 Thrombocytopenia, unspecified: Secondary | ICD-10-CM | POA: Diagnosis not present

## 2016-07-17 DIAGNOSIS — D509 Iron deficiency anemia, unspecified: Secondary | ICD-10-CM | POA: Diagnosis not present

## 2016-07-18 DIAGNOSIS — L97422 Non-pressure chronic ulcer of left heel and midfoot with fat layer exposed: Secondary | ICD-10-CM | POA: Diagnosis not present

## 2016-07-18 DIAGNOSIS — L89623 Pressure ulcer of left heel, stage 3: Secondary | ICD-10-CM | POA: Diagnosis not present

## 2016-07-18 DIAGNOSIS — L89321 Pressure ulcer of left buttock, stage 1: Secondary | ICD-10-CM | POA: Diagnosis not present

## 2016-07-18 DIAGNOSIS — I132 Hypertensive heart and chronic kidney disease with heart failure and with stage 5 chronic kidney disease, or end stage renal disease: Secondary | ICD-10-CM | POA: Diagnosis not present

## 2016-07-18 DIAGNOSIS — E11621 Type 2 diabetes mellitus with foot ulcer: Secondary | ICD-10-CM | POA: Diagnosis not present

## 2016-07-18 DIAGNOSIS — E1122 Type 2 diabetes mellitus with diabetic chronic kidney disease: Secondary | ICD-10-CM | POA: Diagnosis not present

## 2016-07-18 DIAGNOSIS — I70244 Atherosclerosis of native arteries of left leg with ulceration of heel and midfoot: Secondary | ICD-10-CM | POA: Diagnosis not present

## 2016-07-19 DIAGNOSIS — E1129 Type 2 diabetes mellitus with other diabetic kidney complication: Secondary | ICD-10-CM | POA: Diagnosis not present

## 2016-07-19 DIAGNOSIS — N2581 Secondary hyperparathyroidism of renal origin: Secondary | ICD-10-CM | POA: Diagnosis not present

## 2016-07-19 DIAGNOSIS — D509 Iron deficiency anemia, unspecified: Secondary | ICD-10-CM | POA: Diagnosis not present

## 2016-07-19 DIAGNOSIS — D696 Thrombocytopenia, unspecified: Secondary | ICD-10-CM | POA: Diagnosis not present

## 2016-07-19 DIAGNOSIS — N186 End stage renal disease: Secondary | ICD-10-CM | POA: Diagnosis not present

## 2016-07-21 DIAGNOSIS — D696 Thrombocytopenia, unspecified: Secondary | ICD-10-CM | POA: Diagnosis not present

## 2016-07-21 DIAGNOSIS — N2581 Secondary hyperparathyroidism of renal origin: Secondary | ICD-10-CM | POA: Diagnosis not present

## 2016-07-21 DIAGNOSIS — D509 Iron deficiency anemia, unspecified: Secondary | ICD-10-CM | POA: Diagnosis not present

## 2016-07-21 DIAGNOSIS — N186 End stage renal disease: Secondary | ICD-10-CM | POA: Diagnosis not present

## 2016-07-21 DIAGNOSIS — E1129 Type 2 diabetes mellitus with other diabetic kidney complication: Secondary | ICD-10-CM | POA: Diagnosis not present

## 2016-07-24 DIAGNOSIS — N2581 Secondary hyperparathyroidism of renal origin: Secondary | ICD-10-CM | POA: Diagnosis not present

## 2016-07-24 DIAGNOSIS — D696 Thrombocytopenia, unspecified: Secondary | ICD-10-CM | POA: Diagnosis not present

## 2016-07-24 DIAGNOSIS — E1129 Type 2 diabetes mellitus with other diabetic kidney complication: Secondary | ICD-10-CM | POA: Diagnosis not present

## 2016-07-24 DIAGNOSIS — N186 End stage renal disease: Secondary | ICD-10-CM | POA: Diagnosis not present

## 2016-07-24 DIAGNOSIS — D509 Iron deficiency anemia, unspecified: Secondary | ICD-10-CM | POA: Diagnosis not present

## 2016-07-25 DIAGNOSIS — L89623 Pressure ulcer of left heel, stage 3: Secondary | ICD-10-CM | POA: Diagnosis not present

## 2016-07-25 DIAGNOSIS — E11621 Type 2 diabetes mellitus with foot ulcer: Secondary | ICD-10-CM | POA: Diagnosis not present

## 2016-07-25 DIAGNOSIS — I70244 Atherosclerosis of native arteries of left leg with ulceration of heel and midfoot: Secondary | ICD-10-CM | POA: Diagnosis not present

## 2016-07-25 DIAGNOSIS — I132 Hypertensive heart and chronic kidney disease with heart failure and with stage 5 chronic kidney disease, or end stage renal disease: Secondary | ICD-10-CM | POA: Diagnosis not present

## 2016-07-25 DIAGNOSIS — E1122 Type 2 diabetes mellitus with diabetic chronic kidney disease: Secondary | ICD-10-CM | POA: Diagnosis not present

## 2016-07-25 DIAGNOSIS — L89321 Pressure ulcer of left buttock, stage 1: Secondary | ICD-10-CM | POA: Diagnosis not present

## 2016-07-26 DIAGNOSIS — E1129 Type 2 diabetes mellitus with other diabetic kidney complication: Secondary | ICD-10-CM | POA: Diagnosis not present

## 2016-07-26 DIAGNOSIS — D696 Thrombocytopenia, unspecified: Secondary | ICD-10-CM | POA: Diagnosis not present

## 2016-07-26 DIAGNOSIS — N2581 Secondary hyperparathyroidism of renal origin: Secondary | ICD-10-CM | POA: Diagnosis not present

## 2016-07-26 DIAGNOSIS — D509 Iron deficiency anemia, unspecified: Secondary | ICD-10-CM | POA: Diagnosis not present

## 2016-07-26 DIAGNOSIS — Z992 Dependence on renal dialysis: Secondary | ICD-10-CM | POA: Diagnosis not present

## 2016-07-26 DIAGNOSIS — N186 End stage renal disease: Secondary | ICD-10-CM | POA: Diagnosis not present

## 2016-07-26 DIAGNOSIS — E1122 Type 2 diabetes mellitus with diabetic chronic kidney disease: Secondary | ICD-10-CM | POA: Diagnosis not present

## 2016-07-27 ENCOUNTER — Ambulatory Visit (INDEPENDENT_AMBULATORY_CARE_PROVIDER_SITE_OTHER): Payer: Medicare Other | Admitting: *Deleted

## 2016-07-27 DIAGNOSIS — Z5181 Encounter for therapeutic drug level monitoring: Secondary | ICD-10-CM | POA: Diagnosis not present

## 2016-07-27 LAB — POCT INR: INR: 2.4

## 2016-07-28 DIAGNOSIS — Z23 Encounter for immunization: Secondary | ICD-10-CM | POA: Diagnosis not present

## 2016-07-28 DIAGNOSIS — D509 Iron deficiency anemia, unspecified: Secondary | ICD-10-CM | POA: Diagnosis not present

## 2016-07-28 DIAGNOSIS — N2581 Secondary hyperparathyroidism of renal origin: Secondary | ICD-10-CM | POA: Diagnosis not present

## 2016-07-28 DIAGNOSIS — E1129 Type 2 diabetes mellitus with other diabetic kidney complication: Secondary | ICD-10-CM | POA: Diagnosis not present

## 2016-07-28 DIAGNOSIS — N186 End stage renal disease: Secondary | ICD-10-CM | POA: Diagnosis not present

## 2016-07-31 DIAGNOSIS — D509 Iron deficiency anemia, unspecified: Secondary | ICD-10-CM | POA: Diagnosis not present

## 2016-07-31 DIAGNOSIS — N2581 Secondary hyperparathyroidism of renal origin: Secondary | ICD-10-CM | POA: Diagnosis not present

## 2016-07-31 DIAGNOSIS — E1129 Type 2 diabetes mellitus with other diabetic kidney complication: Secondary | ICD-10-CM | POA: Diagnosis not present

## 2016-07-31 DIAGNOSIS — Z23 Encounter for immunization: Secondary | ICD-10-CM | POA: Diagnosis not present

## 2016-07-31 DIAGNOSIS — N186 End stage renal disease: Secondary | ICD-10-CM | POA: Diagnosis not present

## 2016-08-01 ENCOUNTER — Encounter (HOSPITAL_BASED_OUTPATIENT_CLINIC_OR_DEPARTMENT_OTHER): Payer: Medicare Other | Attending: Surgery

## 2016-08-01 DIAGNOSIS — N185 Chronic kidney disease, stage 5: Secondary | ICD-10-CM | POA: Diagnosis not present

## 2016-08-01 DIAGNOSIS — I120 Hypertensive chronic kidney disease with stage 5 chronic kidney disease or end stage renal disease: Secondary | ICD-10-CM | POA: Diagnosis not present

## 2016-08-01 DIAGNOSIS — N186 End stage renal disease: Secondary | ICD-10-CM | POA: Insufficient documentation

## 2016-08-01 DIAGNOSIS — E784 Other hyperlipidemia: Secondary | ICD-10-CM | POA: Diagnosis not present

## 2016-08-01 DIAGNOSIS — I70244 Atherosclerosis of native arteries of left leg with ulceration of heel and midfoot: Secondary | ICD-10-CM | POA: Diagnosis not present

## 2016-08-01 DIAGNOSIS — I2510 Atherosclerotic heart disease of native coronary artery without angina pectoris: Secondary | ICD-10-CM | POA: Insufficient documentation

## 2016-08-01 DIAGNOSIS — I10 Essential (primary) hypertension: Secondary | ICD-10-CM | POA: Diagnosis not present

## 2016-08-01 DIAGNOSIS — I480 Paroxysmal atrial fibrillation: Secondary | ICD-10-CM | POA: Diagnosis not present

## 2016-08-01 DIAGNOSIS — E1122 Type 2 diabetes mellitus with diabetic chronic kidney disease: Secondary | ICD-10-CM | POA: Insufficient documentation

## 2016-08-01 DIAGNOSIS — E038 Other specified hypothyroidism: Secondary | ICD-10-CM | POA: Diagnosis not present

## 2016-08-01 DIAGNOSIS — L89623 Pressure ulcer of left heel, stage 3: Secondary | ICD-10-CM | POA: Diagnosis not present

## 2016-08-01 DIAGNOSIS — F0390 Unspecified dementia without behavioral disturbance: Secondary | ICD-10-CM | POA: Diagnosis not present

## 2016-08-01 DIAGNOSIS — R2689 Other abnormalities of gait and mobility: Secondary | ICD-10-CM | POA: Diagnosis not present

## 2016-08-01 DIAGNOSIS — L97821 Non-pressure chronic ulcer of other part of left lower leg limited to breakdown of skin: Secondary | ICD-10-CM | POA: Diagnosis not present

## 2016-08-01 DIAGNOSIS — Z6826 Body mass index (BMI) 26.0-26.9, adult: Secondary | ICD-10-CM | POA: Diagnosis not present

## 2016-08-01 DIAGNOSIS — E1129 Type 2 diabetes mellitus with other diabetic kidney complication: Secondary | ICD-10-CM | POA: Diagnosis not present

## 2016-08-02 DIAGNOSIS — N2581 Secondary hyperparathyroidism of renal origin: Secondary | ICD-10-CM | POA: Diagnosis not present

## 2016-08-02 DIAGNOSIS — N186 End stage renal disease: Secondary | ICD-10-CM | POA: Diagnosis not present

## 2016-08-02 DIAGNOSIS — Z23 Encounter for immunization: Secondary | ICD-10-CM | POA: Diagnosis not present

## 2016-08-02 DIAGNOSIS — D509 Iron deficiency anemia, unspecified: Secondary | ICD-10-CM | POA: Diagnosis not present

## 2016-08-02 DIAGNOSIS — E1129 Type 2 diabetes mellitus with other diabetic kidney complication: Secondary | ICD-10-CM | POA: Diagnosis not present

## 2016-08-04 DIAGNOSIS — N186 End stage renal disease: Secondary | ICD-10-CM | POA: Diagnosis not present

## 2016-08-04 DIAGNOSIS — E1129 Type 2 diabetes mellitus with other diabetic kidney complication: Secondary | ICD-10-CM | POA: Diagnosis not present

## 2016-08-04 DIAGNOSIS — D509 Iron deficiency anemia, unspecified: Secondary | ICD-10-CM | POA: Diagnosis not present

## 2016-08-04 DIAGNOSIS — N2581 Secondary hyperparathyroidism of renal origin: Secondary | ICD-10-CM | POA: Diagnosis not present

## 2016-08-04 DIAGNOSIS — Z23 Encounter for immunization: Secondary | ICD-10-CM | POA: Diagnosis not present

## 2016-08-07 DIAGNOSIS — N186 End stage renal disease: Secondary | ICD-10-CM | POA: Diagnosis not present

## 2016-08-07 DIAGNOSIS — N2581 Secondary hyperparathyroidism of renal origin: Secondary | ICD-10-CM | POA: Diagnosis not present

## 2016-08-07 DIAGNOSIS — E1129 Type 2 diabetes mellitus with other diabetic kidney complication: Secondary | ICD-10-CM | POA: Diagnosis not present

## 2016-08-07 DIAGNOSIS — D509 Iron deficiency anemia, unspecified: Secondary | ICD-10-CM | POA: Diagnosis not present

## 2016-08-07 DIAGNOSIS — Z23 Encounter for immunization: Secondary | ICD-10-CM | POA: Diagnosis not present

## 2016-08-08 ENCOUNTER — Encounter: Payer: Self-pay | Admitting: Podiatry

## 2016-08-08 ENCOUNTER — Ambulatory Visit (INDEPENDENT_AMBULATORY_CARE_PROVIDER_SITE_OTHER): Payer: Medicare Other | Admitting: Podiatry

## 2016-08-08 VITALS — BP 158/73 | HR 66

## 2016-08-08 DIAGNOSIS — E1151 Type 2 diabetes mellitus with diabetic peripheral angiopathy without gangrene: Secondary | ICD-10-CM

## 2016-08-08 DIAGNOSIS — M79676 Pain in unspecified toe(s): Secondary | ICD-10-CM | POA: Diagnosis not present

## 2016-08-08 DIAGNOSIS — I120 Hypertensive chronic kidney disease with stage 5 chronic kidney disease or end stage renal disease: Secondary | ICD-10-CM | POA: Diagnosis not present

## 2016-08-08 DIAGNOSIS — L89623 Pressure ulcer of left heel, stage 3: Secondary | ICD-10-CM | POA: Diagnosis not present

## 2016-08-08 DIAGNOSIS — I70244 Atherosclerosis of native arteries of left leg with ulceration of heel and midfoot: Secondary | ICD-10-CM | POA: Diagnosis not present

## 2016-08-08 DIAGNOSIS — L97821 Non-pressure chronic ulcer of other part of left lower leg limited to breakdown of skin: Secondary | ICD-10-CM | POA: Diagnosis not present

## 2016-08-08 DIAGNOSIS — B351 Tinea unguium: Secondary | ICD-10-CM | POA: Diagnosis not present

## 2016-08-08 DIAGNOSIS — E1122 Type 2 diabetes mellitus with diabetic chronic kidney disease: Secondary | ICD-10-CM | POA: Diagnosis not present

## 2016-08-08 DIAGNOSIS — I2510 Atherosclerotic heart disease of native coronary artery without angina pectoris: Secondary | ICD-10-CM | POA: Diagnosis not present

## 2016-08-08 NOTE — Progress Notes (Signed)
Patient ID: [name], male   DOB: [birth year], 83 y.o.   MRN: [id number]     Subjective: This patient presents for a scheduled visit complaining that his toenails are thickened and elongated request toenail debridement. Patient also under care and wound care center for the past 3 weeks for skin ulcer on left heel. The patient's wife describes weekly application of a debriding enzyme and dressing and patient has scheduled visit for skin ulcer today Patient is a diabetic with a history of peripheral arterial disease  Objective: Patient is slow to respond DP right 2/4 DP left 1/4 PT pulses nonpalpable bilaterally Capillary reflex delayed bilaterally Gauze dressing on left ankle and heel for ongoing ulcer under treatment of wound care Patient wearing boot on r left foot Plantar callus fifth MPJ right HAV right Hammertoe second bilaterally Atrophic skin bilaterally Toenails are elongated brittle, deformed and tender to direct palpation  Assessment: Diabetic with peripheral arterial disease Skin ulcer left under treatment of wound care center Symptomatic mycotic toenails 6-10  Plan: Debridement toenails 6-10 mechanically analytic without any bleeding  Reappoint 3 months

## 2016-08-08 NOTE — Patient Instructions (Signed)

## 2016-08-09 DIAGNOSIS — N186 End stage renal disease: Secondary | ICD-10-CM | POA: Diagnosis not present

## 2016-08-09 DIAGNOSIS — N2581 Secondary hyperparathyroidism of renal origin: Secondary | ICD-10-CM | POA: Diagnosis not present

## 2016-08-09 DIAGNOSIS — D509 Iron deficiency anemia, unspecified: Secondary | ICD-10-CM | POA: Diagnosis not present

## 2016-08-09 DIAGNOSIS — E1129 Type 2 diabetes mellitus with other diabetic kidney complication: Secondary | ICD-10-CM | POA: Diagnosis not present

## 2016-08-09 DIAGNOSIS — Z23 Encounter for immunization: Secondary | ICD-10-CM | POA: Diagnosis not present

## 2016-08-11 DIAGNOSIS — E1129 Type 2 diabetes mellitus with other diabetic kidney complication: Secondary | ICD-10-CM | POA: Diagnosis not present

## 2016-08-11 DIAGNOSIS — N2581 Secondary hyperparathyroidism of renal origin: Secondary | ICD-10-CM | POA: Diagnosis not present

## 2016-08-11 DIAGNOSIS — Z23 Encounter for immunization: Secondary | ICD-10-CM | POA: Diagnosis not present

## 2016-08-11 DIAGNOSIS — D509 Iron deficiency anemia, unspecified: Secondary | ICD-10-CM | POA: Diagnosis not present

## 2016-08-11 DIAGNOSIS — N186 End stage renal disease: Secondary | ICD-10-CM | POA: Diagnosis not present

## 2016-08-14 DIAGNOSIS — N2581 Secondary hyperparathyroidism of renal origin: Secondary | ICD-10-CM | POA: Diagnosis not present

## 2016-08-14 DIAGNOSIS — N186 End stage renal disease: Secondary | ICD-10-CM | POA: Diagnosis not present

## 2016-08-14 DIAGNOSIS — E1129 Type 2 diabetes mellitus with other diabetic kidney complication: Secondary | ICD-10-CM | POA: Diagnosis not present

## 2016-08-14 DIAGNOSIS — D509 Iron deficiency anemia, unspecified: Secondary | ICD-10-CM | POA: Diagnosis not present

## 2016-08-14 DIAGNOSIS — Z23 Encounter for immunization: Secondary | ICD-10-CM | POA: Diagnosis not present

## 2016-08-15 DIAGNOSIS — I871 Compression of vein: Secondary | ICD-10-CM | POA: Diagnosis not present

## 2016-08-15 DIAGNOSIS — E11621 Type 2 diabetes mellitus with foot ulcer: Secondary | ICD-10-CM | POA: Diagnosis not present

## 2016-08-15 DIAGNOSIS — T82858A Stenosis of vascular prosthetic devices, implants and grafts, initial encounter: Secondary | ICD-10-CM | POA: Diagnosis not present

## 2016-08-15 DIAGNOSIS — I70244 Atherosclerosis of native arteries of left leg with ulceration of heel and midfoot: Secondary | ICD-10-CM | POA: Diagnosis not present

## 2016-08-15 DIAGNOSIS — L97829 Non-pressure chronic ulcer of other part of left lower leg with unspecified severity: Secondary | ICD-10-CM | POA: Diagnosis not present

## 2016-08-15 DIAGNOSIS — L89623 Pressure ulcer of left heel, stage 3: Secondary | ICD-10-CM | POA: Diagnosis not present

## 2016-08-15 DIAGNOSIS — L97821 Non-pressure chronic ulcer of other part of left lower leg limited to breakdown of skin: Secondary | ICD-10-CM | POA: Diagnosis not present

## 2016-08-15 DIAGNOSIS — L97429 Non-pressure chronic ulcer of left heel and midfoot with unspecified severity: Secondary | ICD-10-CM | POA: Diagnosis not present

## 2016-08-15 DIAGNOSIS — N186 End stage renal disease: Secondary | ICD-10-CM | POA: Diagnosis not present

## 2016-08-15 DIAGNOSIS — I2510 Atherosclerotic heart disease of native coronary artery without angina pectoris: Secondary | ICD-10-CM | POA: Diagnosis not present

## 2016-08-15 DIAGNOSIS — I120 Hypertensive chronic kidney disease with stage 5 chronic kidney disease or end stage renal disease: Secondary | ICD-10-CM | POA: Diagnosis not present

## 2016-08-15 DIAGNOSIS — Z992 Dependence on renal dialysis: Secondary | ICD-10-CM | POA: Diagnosis not present

## 2016-08-15 DIAGNOSIS — E11622 Type 2 diabetes mellitus with other skin ulcer: Secondary | ICD-10-CM | POA: Diagnosis not present

## 2016-08-15 DIAGNOSIS — E1122 Type 2 diabetes mellitus with diabetic chronic kidney disease: Secondary | ICD-10-CM | POA: Diagnosis not present

## 2016-08-16 DIAGNOSIS — E1129 Type 2 diabetes mellitus with other diabetic kidney complication: Secondary | ICD-10-CM | POA: Diagnosis not present

## 2016-08-16 DIAGNOSIS — N186 End stage renal disease: Secondary | ICD-10-CM | POA: Diagnosis not present

## 2016-08-16 DIAGNOSIS — D509 Iron deficiency anemia, unspecified: Secondary | ICD-10-CM | POA: Diagnosis not present

## 2016-08-16 DIAGNOSIS — N2581 Secondary hyperparathyroidism of renal origin: Secondary | ICD-10-CM | POA: Diagnosis not present

## 2016-08-16 DIAGNOSIS — Z23 Encounter for immunization: Secondary | ICD-10-CM | POA: Diagnosis not present

## 2016-08-18 DIAGNOSIS — E1129 Type 2 diabetes mellitus with other diabetic kidney complication: Secondary | ICD-10-CM | POA: Diagnosis not present

## 2016-08-18 DIAGNOSIS — D509 Iron deficiency anemia, unspecified: Secondary | ICD-10-CM | POA: Diagnosis not present

## 2016-08-18 DIAGNOSIS — N2581 Secondary hyperparathyroidism of renal origin: Secondary | ICD-10-CM | POA: Diagnosis not present

## 2016-08-18 DIAGNOSIS — N186 End stage renal disease: Secondary | ICD-10-CM | POA: Diagnosis not present

## 2016-08-18 DIAGNOSIS — Z23 Encounter for immunization: Secondary | ICD-10-CM | POA: Diagnosis not present

## 2016-08-19 DIAGNOSIS — R0682 Tachypnea, not elsewhere classified: Secondary | ICD-10-CM | POA: Diagnosis not present

## 2016-08-23 DIAGNOSIS — Z23 Encounter for immunization: Secondary | ICD-10-CM | POA: Diagnosis not present

## 2016-08-23 DIAGNOSIS — E1122 Type 2 diabetes mellitus with diabetic chronic kidney disease: Secondary | ICD-10-CM | POA: Diagnosis not present

## 2016-08-23 DIAGNOSIS — E1129 Type 2 diabetes mellitus with other diabetic kidney complication: Secondary | ICD-10-CM | POA: Diagnosis not present

## 2016-08-23 DIAGNOSIS — N186 End stage renal disease: Secondary | ICD-10-CM | POA: Diagnosis not present

## 2016-08-23 DIAGNOSIS — Z992 Dependence on renal dialysis: Secondary | ICD-10-CM | POA: Diagnosis not present

## 2016-08-23 DIAGNOSIS — N2581 Secondary hyperparathyroidism of renal origin: Secondary | ICD-10-CM | POA: Diagnosis not present

## 2016-08-23 DIAGNOSIS — D509 Iron deficiency anemia, unspecified: Secondary | ICD-10-CM | POA: Diagnosis not present

## 2016-08-25 ENCOUNTER — Encounter: Payer: Self-pay | Admitting: Family

## 2016-08-25 DIAGNOSIS — E1129 Type 2 diabetes mellitus with other diabetic kidney complication: Secondary | ICD-10-CM | POA: Diagnosis not present

## 2016-08-25 DIAGNOSIS — D509 Iron deficiency anemia, unspecified: Secondary | ICD-10-CM | POA: Diagnosis not present

## 2016-08-25 DIAGNOSIS — N186 End stage renal disease: Secondary | ICD-10-CM | POA: Diagnosis not present

## 2016-08-25 DIAGNOSIS — Z23 Encounter for immunization: Secondary | ICD-10-CM | POA: Diagnosis not present

## 2016-08-25 DIAGNOSIS — N2581 Secondary hyperparathyroidism of renal origin: Secondary | ICD-10-CM | POA: Diagnosis not present

## 2016-08-30 DIAGNOSIS — N186 End stage renal disease: Secondary | ICD-10-CM | POA: Diagnosis not present

## 2016-08-30 DIAGNOSIS — E1129 Type 2 diabetes mellitus with other diabetic kidney complication: Secondary | ICD-10-CM | POA: Diagnosis not present

## 2016-08-30 DIAGNOSIS — N2581 Secondary hyperparathyroidism of renal origin: Secondary | ICD-10-CM | POA: Diagnosis not present

## 2016-08-30 DIAGNOSIS — D509 Iron deficiency anemia, unspecified: Secondary | ICD-10-CM | POA: Diagnosis not present

## 2016-08-30 DIAGNOSIS — Z23 Encounter for immunization: Secondary | ICD-10-CM | POA: Diagnosis not present

## 2016-09-01 DIAGNOSIS — N186 End stage renal disease: Secondary | ICD-10-CM | POA: Diagnosis not present

## 2016-09-01 DIAGNOSIS — E1129 Type 2 diabetes mellitus with other diabetic kidney complication: Secondary | ICD-10-CM | POA: Diagnosis not present

## 2016-09-01 DIAGNOSIS — N2581 Secondary hyperparathyroidism of renal origin: Secondary | ICD-10-CM | POA: Diagnosis not present

## 2016-09-01 DIAGNOSIS — D509 Iron deficiency anemia, unspecified: Secondary | ICD-10-CM | POA: Diagnosis not present

## 2016-09-01 DIAGNOSIS — Z23 Encounter for immunization: Secondary | ICD-10-CM | POA: Diagnosis not present

## 2016-09-05 ENCOUNTER — Ambulatory Visit (HOSPITAL_COMMUNITY)
Admission: RE | Admit: 2016-09-05 | Discharge: 2016-09-05 | Disposition: A | Discharge status: Home / Self Care | Payer: Medicare Other | Source: Ambulatory Visit | Attending: Family | Admitting: Family

## 2016-09-05 ENCOUNTER — Ambulatory Visit (INDEPENDENT_AMBULATORY_CARE_PROVIDER_SITE_OTHER): Payer: Medicare Other | Admitting: Vascular Surgery

## 2016-09-05 ENCOUNTER — Ambulatory Visit (INDEPENDENT_AMBULATORY_CARE_PROVIDER_SITE_OTHER)
Admission: RE | Admit: 2016-09-05 | Discharge: 2016-09-05 | Disposition: A | Discharge status: Home / Self Care | Payer: Medicare Other | Source: Ambulatory Visit | Attending: Family | Admitting: Family

## 2016-09-05 ENCOUNTER — Ambulatory Visit: Payer: Medicare Other | Admitting: Family

## 2016-09-05 VITALS — BP 196/68 | HR 67 | Temp 97.4°F | Resp 20 | Ht 70.0 in | Wt 178.0 lb

## 2016-09-05 DIAGNOSIS — I779 Disorder of arteries and arterioles, unspecified: Secondary | ICD-10-CM

## 2016-09-05 DIAGNOSIS — I6523 Occlusion and stenosis of bilateral carotid arteries: Secondary | ICD-10-CM | POA: Diagnosis not present

## 2016-09-05 DIAGNOSIS — I739 Peripheral vascular disease, unspecified: Secondary | ICD-10-CM

## 2016-09-05 DIAGNOSIS — Z48812 Encounter for surgical aftercare following surgery on the circulatory system: Secondary | ICD-10-CM

## 2016-09-05 LAB — VAS US CAROTID
LCCADDIAS: -22 cm/s
LCCADSYS: -93 cm/s
LCCAPSYS: 57 cm/s
LEFT ECA DIAS: 0 cm/s
Left CCA prox dias: 15 cm/s
Left ICA dist dias: -18 cm/s
Left ICA dist sys: -72 cm/s
Left ICA prox dias: -25 cm/s
Left ICA prox sys: -132 cm/s
RCCADSYS: -70 cm/s
RCCAPSYS: 84 cm/s
RIGHT CCA MID DIAS: 11 cm/s
RIGHT ECA DIAS: -1 cm/s
Right CCA prox dias: 0 cm/s

## 2016-09-05 NOTE — Progress Notes (Signed)
[facility]  Patient name: [name] MRN: [id number] DOB: [birth year] Sex: male  REASON FOR VISIT: follow-up  HPI: [name] is a 83 y.o. male who presents for continued follow-up his carotid artery disease and peripheral vascular disease. He has previously undergone bilateral carotid endarterectomies and a left interposition vein graft placement. Regarding his carotid disease, the patient denies any amaurosis fugax, sudden onset weakness or numbness of the extremities and expressive/receptive aphasia.  Regarding his peripheral arterial disease, the patient has a left heel wound that has finally healed. He is going to the wound center. This wound developed swelling he was in rehabilitation following a hospitalization in March 2017. The patient walks around the house with a walker. He denies any rest pain. He does not ambulate enough to elicit claudication.  The patient is on dialysis via a left upper arm fistula. He has atrial fibrillation on Coumadin. He has history of CAD status post CABG and redo CABG. His past medical history also includes hypertension, hyperlipidemia, chronic combined heart failure and dementia. He has never been a smoker. He uses home oxygen and sleeps in a recliner at night due to orthopnea.   Past Medical History:  Diagnosis Date  . Atrial fibrillation ([place]) Aug. 2015   a. Dx 01/2014, s/p DCCV 03/06/14.  Marland Kitchen CAD (coronary artery disease)    a. prior CABG in 1997 with redo in 2000. b. last cath in 2008 - managed medically  . Carotid disease, bilateral ([place])    with multiple bilateral carotid surgeries  . Chronic diastolic CHF (congestive heart failure) ([place])   . Chronic respiratory failure with hypoxia ([place])   . CKD (chronic kidney disease), stage IV ([place])    a. Has fistula in place.   . Degenerative joint disease   . Dementia   . Generalized weakness    without overt findings  . GERD (gastroesophageal reflux disease)   .  Gout   . Hyperlipidemia   . Hypertension   . Hypothyroidism   . Meniere's disease   . On home oxygen therapy    "2L" qhs (09/09/2015  . Orthopnea    Two-pillow  . Peripheral vascular disease ([place])   . Psoriasis   . Shingles   . Sinus bradycardia    a. Toprol D/C'd due to this.   . Type 2 diabetes mellitus ([place])     Family History  Problem Relation Age of Onset  . Heart attack Father   . Heart disease Father     After 41 yrs of age  . Hyperlipidemia Father   . Hypertension Father   . Diabetes Mother   . Hypertension Mother   . Heart disease Mother   . Heart disease Brother   . Diabetes Brother     SOCIAL HISTORY: Social History  Substance Use Topics  . Smoking status: Never Smoker  . Smokeless tobacco: Never Used  . Alcohol use No    Allergies  Allergen Reactions  . Betadine [Povidone Iodine] Rash  . Iodinated Diagnostic Agents Rash and Other (See Comments)    Does fine with premedications for cath  . Latex Hives  . Tape Rash    MUST BE FREE OF ANY LATEX    Current Outpatient Prescriptions  Medication Sig Dispense Refill  . acetaminophen (TYLENOL) 325 MG tablet Take 2 tablets (650 mg total) by mouth every 4 (four) hours as needed for headache or mild pain.    Marland Kitchen atorvastatin (LIPITOR) 80 MG  tablet TAKE ONE TABLET BY MOUTH ONCE DAILY 90 tablet 3  . b complex-vitamin c-folic acid (NEPHRO-VITE) 0.8 MG TABS tablet Take 1 tablet by mouth at bedtime.    . cholecalciferol (VITAMIN D) 1000 UNITS tablet Take 1,000 Units by mouth daily at 6 PM. D3    . diphenhydrAMINE (BENADRYL) 25 mg capsule Take 50 mg by mouth See admin instructions. To be taken as a one-time dose for procedure on 01/13/16    . insulin aspart (NOVOLOG FLEXPEN) 100 UNIT/ML FlexPen Inject 8 Units into the skin 2 (two) times daily before a meal.    . Insulin Glargine (LANTUS SOLOSTAR) 100 UNIT/ML SOPN Inject 24 Units into the skin every morning.     Marland Kitchen levothyroxine (SYNTHROID, LEVOTHROID) 175 MCG tablet  Take 175 mcg by mouth daily before breakfast.    . memantine (NAMENDA) 5 MG tablet Take 5 mg by mouth daily.     . Multiple Vitamins-Minerals (DECUBI-VITE) CAPS Take 1 capsule by mouth daily.    . nitroGLYCERIN (NITROSTAT) 0.4 MG SL tablet Place 1 tablet (0.4 mg total) under the tongue every 5 (five) minutes as needed for chest pain. 30 tablet 12  . OXYGEN Inhale 4 L/min into the lungs continuous. To keep O2 sats >90    . ranitidine (ZANTAC) 300 MG tablet Take 300 mg by mouth daily.    . sevelamer carbonate (RENVELA) 2.4 g PACK Take 2.4 g by mouth 3 (three) times daily. 90 each   . warfarin (COUMADIN) 5 MG tablet Take 5-7.5 mg by mouth daily. 5 mg every day except Monday Thursday patient takes 7.5 mg    . warfarin (COUMADIN) 5 MG tablet TAKE AS DIRECTED 100 tablet 0   No current facility-administered medications for this visit.     REVIEW OF SYSTEMS:  [X]  denotes positive finding, [ ]  denotes negative finding Cardiac  Comments:  Chest pain or chest pressure:    Shortness of breath upon exertion: x   Short of breath when lying flat: x   Irregular heart rhythm:        Vascular    Pain in calf, thigh, or hip brought on by ambulation:    Pain in feet at night that wakes you up from your sleep:     Blood clot in your veins:    Leg swelling:         Pulmonary    Oxygen at home: x   Productive cough:     Wheezing:         Neurologic    Sudden weakness in arms or legs:     Sudden numbness in arms or legs:     Sudden onset of difficulty speaking or slurred speech:    Temporary loss of vision in one eye:     Problems with dizziness:         Gastrointestinal    Blood in stool:     Vomited blood:         Genitourinary    Burning when urinating:     Blood in urine:        Psychiatric    Major depression:         Hematologic    Bleeding problems:    Problems with blood clotting too easily:        Skin    Rashes or ulcers:        Constitutional    Fever or chills:       PHYSICAL EXAM: Vitals:  09/05/16 1347  BP: (!) 196/68  Pulse: 67  Resp: 20  Temp: 97.4 F (36.3 C)  TempSrc: Oral  SpO2: 98%  Weight: 178 lb (80.7 kg)  Height: 5\' 10"  (1.778 m)    GENERAL: The patient is a well-nourished male, in no acute distress. The vital signs are documented above. CARDIAC: There is a regular rate and rhythm. Systolic murmur. Bilateral carotid bruits.  VASCULAR: Non palpable pedal pulses, but feet appear warm and well perfused. Left heel with small dry scab.  PULMONARY: There is good air exchange bilaterally without wheezing or rales. MUSCULOSKELETAL: There are no major deformities or cyanosis. NEUROLOGIC: No focal weakness or paresthesias are detected. SKIN: There are no ulcers or rashes noted. PSYCHIATRIC: The patient has a normal affect.  DATA:  Carotid duplex 09/05/16  Right: 60-80% proximal internal carotid artery stenosis Left: <40% proximal internal carotid artery stenosis, occluded external carotid artery Monophasic left subclavian artery waveforms Vertebral arteries patent with antegrade flow bilaterally.   ABIs 09/05/16  R: 0.73 with monophasic waveforms, TBI: 1.08 L: 0.90 with monophasic waveforms, TBI 0.63  ABIs likely overestimated due to vessel calcification  MEDICAL ISSUES: Asymptomatic carotid stenosis with prior history of bilateral carotid endarterectomies and redo left carotid with interposition graft placement  There is no evidence of restenosis on the left. He does have a 60-80% right proximal internal carotid artery restenosis. If this does progress above 80%, he would need to be considered for carotid stenting given age and comorbidities. Follow-up in six months with repeat carotid duplex. He knows to seek emergency assistance if he develops any TIA or stroke symptoms.  Peripheral arterial disease  The patient has finally healed a left heel pressure ulcer that has been present since March 2017. He denies any other non  healing wounds. He is minimally ambulatory. Follow-up in six months with repeat ABIs.    Virgina Jock, PA-C [facility] MD: [place]

## 2016-09-06 DIAGNOSIS — N2581 Secondary hyperparathyroidism of renal origin: Secondary | ICD-10-CM | POA: Diagnosis not present

## 2016-09-06 DIAGNOSIS — N186 End stage renal disease: Secondary | ICD-10-CM | POA: Diagnosis not present

## 2016-09-06 DIAGNOSIS — Z23 Encounter for immunization: Secondary | ICD-10-CM | POA: Diagnosis not present

## 2016-09-06 DIAGNOSIS — D509 Iron deficiency anemia, unspecified: Secondary | ICD-10-CM | POA: Diagnosis not present

## 2016-09-06 DIAGNOSIS — E1129 Type 2 diabetes mellitus with other diabetic kidney complication: Secondary | ICD-10-CM | POA: Diagnosis not present

## 2016-09-06 NOTE — Addendum Note (Signed)
Addended by: Lianne Cure A on: 09/06/2016 09:24 AM   Modules accepted: Orders

## 2016-09-07 ENCOUNTER — Ambulatory Visit (INDEPENDENT_AMBULATORY_CARE_PROVIDER_SITE_OTHER): Payer: Medicare Other | Admitting: Cardiovascular Disease

## 2016-09-07 ENCOUNTER — Encounter: Payer: Self-pay | Admitting: Cardiovascular Disease

## 2016-09-07 ENCOUNTER — Ambulatory Visit (INDEPENDENT_AMBULATORY_CARE_PROVIDER_SITE_OTHER): Payer: Medicare Other | Admitting: Pharmacist

## 2016-09-07 ENCOUNTER — Encounter (INDEPENDENT_AMBULATORY_CARE_PROVIDER_SITE_OTHER): Payer: Self-pay

## 2016-09-07 VITALS — BP 160/60 | HR 68 | Ht 70.0 in | Wt 173.8 lb

## 2016-09-07 DIAGNOSIS — I25118 Atherosclerotic heart disease of native coronary artery with other forms of angina pectoris: Secondary | ICD-10-CM

## 2016-09-07 DIAGNOSIS — Z5181 Encounter for therapeutic drug level monitoring: Secondary | ICD-10-CM

## 2016-09-07 DIAGNOSIS — I779 Disorder of arteries and arterioles, unspecified: Secondary | ICD-10-CM

## 2016-09-07 DIAGNOSIS — I480 Paroxysmal atrial fibrillation: Secondary | ICD-10-CM

## 2016-09-07 DIAGNOSIS — I209 Angina pectoris, unspecified: Secondary | ICD-10-CM | POA: Diagnosis not present

## 2016-09-07 LAB — POCT INR: INR: 2.2

## 2016-09-07 NOTE — Progress Notes (Signed)
[name] Date of Birth  1934-05-03 [facility] [address]    [place], [place]  [address] [phone]  Fax  [phone]  Problem list:  1. Coronary artery disease 2. chronic kidney disease 3. Atrophic leg 4. Hypothyroidism  History of Present Illness:  [name] is a 83 year old gentleman with a history of coronary artery disease and coronary artery bypass grafting. He recently presented with some right-sided shoulder pain that radiates through to his chest. He had a stress Myoview study which revealed a small inferolateral defect. He was set up for cardiac catheterization but his precath labs revealed a creatinine of 2.4 which was new. His baseline creatinine is between 1.6 and 1.8. We discontinued his HCTZ and his ACE inhibitor.  He was seen by his medical doctor who also stop his metformin.  We were going to do a cardiac catheterization this winter but we canceled the case because of his renal insufficiency. His metformin was stopped and he made some other medical changes in his creatinine has now improved. Fortunately, he's not having any further episodes of angina.  He informed me that his insurance company is no longer paying for Ranexa.  He's been bradycardic for years. We do not have him on beta blocker for that reason.  He's had some shoulder problems. He needs to have surgery on his right shoulder soon. He will eventually need to have surgery on his left shoulder. He had a low risk  Myoview study in November, 2012. He had an echocardiogram in August, 2013 that was also unchanged. He does not follow a strict diabetic diet.  His glucose levels went very high after his steroid injection ( > 500)  September 27, 2012:  Pt is doing well.  He denies any chest pain. Has some shoulder soreness.  He does have chronic dyspnea and has had worsening dyspnea since he had the flu in January.  He stays away from salt.  Nov. 13, 2014;  He contniues to have  dyspnea an CP.  He did not take a NTG.  He typically gets CP with exertion.   He is short of breath at rest and with exertion.  He seems to minimize his symptoms - wife thinks he has more shortness of breath that he admits to.    i was asked to see him again by Dr. Lorrene Reid for worsening dyspnea.  He avoids  salt.   Echo showed LVEF of 65-70% with LVH, .mild aortic stenosis  Feb. 13, 2015:  Pt is feeling well.  No angina.  Kidney function remained stable. His blood pressure is much better controlled during this visit compared to his last visit 3 months ago. He has been staying with her salt  February 13, 2014: Doing the same  Dec. 2, 2015: No new cardiac issues.  Has had some leg swelling.  Also has some red ulceration in the left lower leg.   They look like bug bites to me.  He has hypothyroidism  and is followed by Dr. Sharlett Iles.  Avoids salt.  Sleeps in a recliner - has lots of leg edema.   December 02, 2014  Pt is doing well.  No CP BP is typically elevated.   Dec. 8, 2016: [name] is seen today for  Evaluation of his coronary artery disease. Is seen in a wheelchair today .    Has a right leg boot on .  Fell and injurred his right leg.  No evidence  of fracture.   Aug. 17, 2017:  [name] is seen today for follow up of his chronic diastolic CHF Is on HD on September 18, 2015. BP has been very low . Says that he feels fine  On Imdur  Is a wheel chair today  Uses a walker at home .  September 07, 2016 BP has been elevated.  Has been on hemodialysis  No CP  BP at home has been somewhat variable - high at times   Current Outpatient Prescriptions on File Prior to Visit  Medication Sig Dispense Refill  . acetaminophen (TYLENOL) 325 MG tablet Take 2 tablets (650 mg total) by mouth every 4 (four) hours as needed for headache or mild pain.    Marland Kitchen atorvastatin (LIPITOR) 80 MG tablet TAKE ONE TABLET BY MOUTH ONCE DAILY 90 tablet 3  . cholecalciferol (VITAMIN D) 1000 UNITS tablet Take 1,000 Units by  mouth daily at 6 PM. D3    . diphenhydrAMINE (BENADRYL) 25 mg capsule Take 50 mg by mouth See admin instructions. To be taken as a one-time dose for procedure on 01/13/16    . insulin aspart (NOVOLOG FLEXPEN) 100 UNIT/ML FlexPen Inject 8 Units into the skin 2 (two) times daily before a meal.    . Insulin Glargine (LANTUS SOLOSTAR) 100 UNIT/ML SOPN Inject 24 Units into the skin every morning.     Marland Kitchen levothyroxine (SYNTHROID, LEVOTHROID) 175 MCG tablet Take 175 mcg by mouth daily before breakfast.    . memantine (NAMENDA) 5 MG tablet Take 5 mg by mouth daily.     . Multiple Vitamins-Minerals (DECUBI-VITE) CAPS Take 1 capsule by mouth daily.    . nitroGLYCERIN (NITROSTAT) 0.4 MG SL tablet Place 1 tablet (0.4 mg total) under the tongue every 5 (five) minutes as needed for chest pain. 30 tablet 12  . ranitidine (ZANTAC) 300 MG tablet Take 300 mg by mouth daily.    . sevelamer carbonate (RENVELA) 2.4 g PACK Take 2.4 g by mouth 3 (three) times daily. 90 each   . warfarin (COUMADIN) 5 MG tablet Take 5-7.5 mg by mouth daily. 5 mg every day except Monday Thursday patient takes 7.5 mg     No current facility-administered medications on file prior to visit.     Allergies  Allergen Reactions  . Betadine [Povidone Iodine] Rash  . Iodinated Diagnostic Agents Rash and Other (See Comments)    Does fine with premedications for cath  . Latex Hives  . Tape Rash    MUST BE FREE OF ANY LATEX    Past Medical History:  Diagnosis Date  . Atrial fibrillation ([place]) Aug. 2015   a. Dx 01/2014, s/p DCCV 03/06/14.  Marland Kitchen CAD (coronary artery disease)    a. prior CABG in 1997 with redo in 2000. b. last cath in 2008 - managed medically  . Carotid disease, bilateral ([place])    with multiple bilateral carotid surgeries  . Chronic diastolic CHF (congestive heart failure) ([place])   . Chronic respiratory failure with hypoxia ([place])   . CKD (chronic kidney disease), stage IV ([place])    a. Has fistula in place.   . Degenerative  joint disease   . Dementia   . Generalized weakness    without overt findings  . GERD (gastroesophageal reflux disease)   . Gout   . Hyperlipidemia   . Hypertension   . Hypothyroidism   . Meniere's disease   . On home oxygen therapy    "2L" qhs (09/09/2015  . Orthopnea  Two-pillow  . Peripheral vascular disease ([place])   . Psoriasis   . Shingles   . Sinus bradycardia    a. Toprol D/C'd due to this.   . Type 2 diabetes mellitus ([place])     Past Surgical History:  Procedure Laterality Date  . APPENDECTOMY    . AV FISTULA PLACEMENT Left 01/21/2013   Procedure: ARTERIOVENOUS (AV) FISTULA CREATION, Left Brachiocephalic;  Surgeon: Angelia Mould, MD;  Location: [facility];  Service: Vascular;  Laterality: Left;  . BLEPHAROPLASTY Bilateral   . CARDIAC CATHETERIZATION    . CARDIAC CATHETERIZATION  2008   L main irreg, LAD 80%, IMA-LAD & SVG-Diag patent, CFX 100%, SVG-OM 90%, RCA 70%, SVG-RCA OK, EF nl, med rx, no vessels appropriate for PCI  . CARDIAC CATHETERIZATION N/A 01/12/2016   Procedure: Left Heart Cath and Cors/Grafts Angiography;  Surgeon: Belva Crome, MD;  Location: [facility];  Service: Cardiovascular;  Laterality: N/A;  . CARDIOVERSION N/A 03/06/2014   Procedure: CARDIOVERSION;  Surgeon: Larey Dresser, MD;  Location: [facility];  Service: Cardiovascular;  Laterality: N/A;  . CAROTID ENDARTERECTOMY Bilateral "several times"   "5 on right; 2-3 on left" (09/09/2015)  . CORONARY ARTERY BYPASS GRAFT  1997;  2002  . LUMBAR LAMINECTOMY  July 2001  . LUNG REMOVAL, PARTIAL    . SHOULDER SURGERY Bilateral   . TEE WITHOUT CARDIOVERSION N/A 03/06/2014   Procedure: TRANSESOPHAGEAL ECHOCARDIOGRAM (TEE);  Surgeon: Larey Dresser, MD;  Location: [facility];  Service: Cardiovascular;  Laterality: N/A;  . THORACOTOMY Left    due to fungal infection    History  Smoking Status  . Never Smoker  Smokeless Tobacco  . Never Used    History  Alcohol Use No    Family  History  Problem Relation Age of Onset  . Heart attack Father   . Heart disease Father     After 50 yrs of age  . Hyperlipidemia Father   . Hypertension Father   . Diabetes Mother   . Hypertension Mother   . Heart disease Mother   . Heart disease Brother   . Diabetes Brother     Reviw of Systems:  Reviewed in the HPI.  All other systems are negative.  Physical Exam: BP (!) 160/60 (BP Location: Right Arm, Patient Position: Sitting, Cuff Size: Normal)   Pulse 68   Ht 5\' 10"  (1.778 m)   Wt 173 lb 12.8 oz (78.8 kg)   SpO2 92%   BMI 24.94 kg/m  The patient is alert and oriented x 3.  The mood and affect are normal.   Skin: warm and dry.  Color is normal.   HEENT:   Normocephalic/atraumatic. Mucous membranes are normal.  Bilateral carotid bruits,  Left > right.  Lungs: Lungs are clear.  Heart: Regular rate S1-S2. Has a 2/6 systolic ejection murmur cholesterol border.   Abdomen: His abdominal exam is benign. Extremities:  No clubbing cyanosis or edema, reduced skin turgur.  Neuro:  Exam is nonfocal.   ECG:   Assessment / Plan:   1. CAD  - he's not having episodes of angina. Continue current medications.  2. Chronic combined systolic and diastolic CHF -   Stable.  Symptoms have improved since starting dialysis  3. Atrial Fib- remains in NSR ,      5. Essential HTN:   - BP is high  .   Now that he's on dialysis, I will defer to the nephrologist for his blood pressure  management.  6. Dementia:   Wife states that he has been having progressive memory issues.    Wife continues to help with answering questions .  7.  ESRD:  He is now on HD.       Mertie Moores, MD  09/07/2016 11:44 AM    [address],  [place], [place]  [address] Pager [phone] Phone: [phone]; Fax: [phone]

## 2016-09-07 NOTE — Patient Instructions (Signed)
Medication Instructions:  Your physician recommends that you continue on your current medications as directed. Please refer to the Current Medication list given to you today.   Labwork: None Ordered   Testing/Procedures: None Ordered   Follow-Up: Your physician wants you to follow-up in: 6 months with Dr. Nahser.  You will receive a reminder letter in the mail two months in advance. If you don't receive a letter, please call our office to schedule the follow-up appointment.   If you need a refill on your cardiac medications before your next appointment, please call your pharmacy.   Thank you for choosing [facility]! Welda Azzarello, RN [phone]    

## 2016-09-08 DIAGNOSIS — Z23 Encounter for immunization: Secondary | ICD-10-CM | POA: Diagnosis not present

## 2016-09-08 DIAGNOSIS — E1129 Type 2 diabetes mellitus with other diabetic kidney complication: Secondary | ICD-10-CM | POA: Diagnosis not present

## 2016-09-08 DIAGNOSIS — N2581 Secondary hyperparathyroidism of renal origin: Secondary | ICD-10-CM | POA: Diagnosis not present

## 2016-09-08 DIAGNOSIS — D509 Iron deficiency anemia, unspecified: Secondary | ICD-10-CM | POA: Diagnosis not present

## 2016-09-08 DIAGNOSIS — N186 End stage renal disease: Secondary | ICD-10-CM | POA: Diagnosis not present

## 2016-09-11 DIAGNOSIS — N186 End stage renal disease: Secondary | ICD-10-CM | POA: Diagnosis not present

## 2016-09-11 DIAGNOSIS — D509 Iron deficiency anemia, unspecified: Secondary | ICD-10-CM | POA: Diagnosis not present

## 2016-09-11 DIAGNOSIS — Z23 Encounter for immunization: Secondary | ICD-10-CM | POA: Diagnosis not present

## 2016-09-11 DIAGNOSIS — N2581 Secondary hyperparathyroidism of renal origin: Secondary | ICD-10-CM | POA: Diagnosis not present

## 2016-09-11 DIAGNOSIS — E1129 Type 2 diabetes mellitus with other diabetic kidney complication: Secondary | ICD-10-CM | POA: Diagnosis not present

## 2016-09-13 DIAGNOSIS — E1129 Type 2 diabetes mellitus with other diabetic kidney complication: Secondary | ICD-10-CM | POA: Diagnosis not present

## 2016-09-13 DIAGNOSIS — N2581 Secondary hyperparathyroidism of renal origin: Secondary | ICD-10-CM | POA: Diagnosis not present

## 2016-09-13 DIAGNOSIS — Z23 Encounter for immunization: Secondary | ICD-10-CM | POA: Diagnosis not present

## 2016-09-13 DIAGNOSIS — D509 Iron deficiency anemia, unspecified: Secondary | ICD-10-CM | POA: Diagnosis not present

## 2016-09-13 DIAGNOSIS — N186 End stage renal disease: Secondary | ICD-10-CM | POA: Diagnosis not present

## 2016-09-15 DIAGNOSIS — D509 Iron deficiency anemia, unspecified: Secondary | ICD-10-CM | POA: Diagnosis not present

## 2016-09-15 DIAGNOSIS — E1129 Type 2 diabetes mellitus with other diabetic kidney complication: Secondary | ICD-10-CM | POA: Diagnosis not present

## 2016-09-15 DIAGNOSIS — N2581 Secondary hyperparathyroidism of renal origin: Secondary | ICD-10-CM | POA: Diagnosis not present

## 2016-09-15 DIAGNOSIS — N186 End stage renal disease: Secondary | ICD-10-CM | POA: Diagnosis not present

## 2016-09-15 DIAGNOSIS — Z23 Encounter for immunization: Secondary | ICD-10-CM | POA: Diagnosis not present

## 2016-09-20 DIAGNOSIS — I10 Essential (primary) hypertension: Secondary | ICD-10-CM | POA: Diagnosis not present

## 2016-09-20 DIAGNOSIS — N185 Chronic kidney disease, stage 5: Secondary | ICD-10-CM | POA: Diagnosis not present

## 2016-09-20 DIAGNOSIS — Z6826 Body mass index (BMI) 26.0-26.9, adult: Secondary | ICD-10-CM | POA: Diagnosis not present

## 2016-09-20 DIAGNOSIS — E1165 Type 2 diabetes mellitus with hyperglycemia: Secondary | ICD-10-CM | POA: Diagnosis not present

## 2016-09-21 ENCOUNTER — Other Ambulatory Visit: Payer: Self-pay | Admitting: Cardiovascular Disease

## 2016-09-22 DIAGNOSIS — N186 End stage renal disease: Secondary | ICD-10-CM | POA: Diagnosis not present

## 2016-09-22 DIAGNOSIS — Z23 Encounter for immunization: Secondary | ICD-10-CM | POA: Diagnosis not present

## 2016-09-22 DIAGNOSIS — D509 Iron deficiency anemia, unspecified: Secondary | ICD-10-CM | POA: Diagnosis not present

## 2016-09-22 DIAGNOSIS — N2581 Secondary hyperparathyroidism of renal origin: Secondary | ICD-10-CM | POA: Diagnosis not present

## 2016-09-22 DIAGNOSIS — E1129 Type 2 diabetes mellitus with other diabetic kidney complication: Secondary | ICD-10-CM | POA: Diagnosis not present

## 2016-09-23 DIAGNOSIS — N186 End stage renal disease: Secondary | ICD-10-CM | POA: Diagnosis not present

## 2016-09-23 DIAGNOSIS — Z992 Dependence on renal dialysis: Secondary | ICD-10-CM | POA: Diagnosis not present

## 2016-09-23 DIAGNOSIS — E1122 Type 2 diabetes mellitus with diabetic chronic kidney disease: Secondary | ICD-10-CM | POA: Diagnosis not present

## 2016-09-25 DIAGNOSIS — E1129 Type 2 diabetes mellitus with other diabetic kidney complication: Secondary | ICD-10-CM | POA: Diagnosis not present

## 2016-09-25 DIAGNOSIS — Z23 Encounter for immunization: Secondary | ICD-10-CM | POA: Diagnosis not present

## 2016-09-25 DIAGNOSIS — N186 End stage renal disease: Secondary | ICD-10-CM | POA: Diagnosis not present

## 2016-09-25 DIAGNOSIS — D631 Anemia in chronic kidney disease: Secondary | ICD-10-CM | POA: Diagnosis not present

## 2016-09-25 DIAGNOSIS — D509 Iron deficiency anemia, unspecified: Secondary | ICD-10-CM | POA: Diagnosis not present

## 2016-09-25 DIAGNOSIS — N2581 Secondary hyperparathyroidism of renal origin: Secondary | ICD-10-CM | POA: Diagnosis not present

## 2016-09-27 DIAGNOSIS — Z23 Encounter for immunization: Secondary | ICD-10-CM | POA: Diagnosis not present

## 2016-09-27 DIAGNOSIS — D631 Anemia in chronic kidney disease: Secondary | ICD-10-CM | POA: Diagnosis not present

## 2016-09-27 DIAGNOSIS — E1129 Type 2 diabetes mellitus with other diabetic kidney complication: Secondary | ICD-10-CM | POA: Diagnosis not present

## 2016-09-27 DIAGNOSIS — D509 Iron deficiency anemia, unspecified: Secondary | ICD-10-CM | POA: Diagnosis not present

## 2016-09-27 DIAGNOSIS — N2581 Secondary hyperparathyroidism of renal origin: Secondary | ICD-10-CM | POA: Diagnosis not present

## 2016-09-27 DIAGNOSIS — N186 End stage renal disease: Secondary | ICD-10-CM | POA: Diagnosis not present

## 2016-09-29 DIAGNOSIS — N2581 Secondary hyperparathyroidism of renal origin: Secondary | ICD-10-CM | POA: Diagnosis not present

## 2016-09-29 DIAGNOSIS — E1129 Type 2 diabetes mellitus with other diabetic kidney complication: Secondary | ICD-10-CM | POA: Diagnosis not present

## 2016-09-29 DIAGNOSIS — D509 Iron deficiency anemia, unspecified: Secondary | ICD-10-CM | POA: Diagnosis not present

## 2016-09-29 DIAGNOSIS — D631 Anemia in chronic kidney disease: Secondary | ICD-10-CM | POA: Diagnosis not present

## 2016-09-29 DIAGNOSIS — Z23 Encounter for immunization: Secondary | ICD-10-CM | POA: Diagnosis not present

## 2016-09-29 DIAGNOSIS — N186 End stage renal disease: Secondary | ICD-10-CM | POA: Diagnosis not present

## 2016-10-04 DIAGNOSIS — E1122 Type 2 diabetes mellitus with diabetic chronic kidney disease: Secondary | ICD-10-CM | POA: Diagnosis not present

## 2016-10-04 DIAGNOSIS — D631 Anemia in chronic kidney disease: Secondary | ICD-10-CM | POA: Diagnosis not present

## 2016-10-04 DIAGNOSIS — N186 End stage renal disease: Secondary | ICD-10-CM | POA: Diagnosis not present

## 2016-10-04 DIAGNOSIS — N2581 Secondary hyperparathyroidism of renal origin: Secondary | ICD-10-CM | POA: Diagnosis not present

## 2016-10-04 DIAGNOSIS — Z23 Encounter for immunization: Secondary | ICD-10-CM | POA: Diagnosis not present

## 2016-10-04 DIAGNOSIS — D509 Iron deficiency anemia, unspecified: Secondary | ICD-10-CM | POA: Diagnosis not present

## 2016-10-04 DIAGNOSIS — E1129 Type 2 diabetes mellitus with other diabetic kidney complication: Secondary | ICD-10-CM | POA: Diagnosis not present

## 2016-10-06 DIAGNOSIS — N186 End stage renal disease: Secondary | ICD-10-CM | POA: Diagnosis not present

## 2016-10-06 DIAGNOSIS — D509 Iron deficiency anemia, unspecified: Secondary | ICD-10-CM | POA: Diagnosis not present

## 2016-10-06 DIAGNOSIS — D631 Anemia in chronic kidney disease: Secondary | ICD-10-CM | POA: Diagnosis not present

## 2016-10-06 DIAGNOSIS — N2581 Secondary hyperparathyroidism of renal origin: Secondary | ICD-10-CM | POA: Diagnosis not present

## 2016-10-06 DIAGNOSIS — Z23 Encounter for immunization: Secondary | ICD-10-CM | POA: Diagnosis not present

## 2016-10-06 DIAGNOSIS — E1129 Type 2 diabetes mellitus with other diabetic kidney complication: Secondary | ICD-10-CM | POA: Diagnosis not present

## 2016-10-09 DIAGNOSIS — D631 Anemia in chronic kidney disease: Secondary | ICD-10-CM | POA: Diagnosis not present

## 2016-10-09 DIAGNOSIS — E1129 Type 2 diabetes mellitus with other diabetic kidney complication: Secondary | ICD-10-CM | POA: Diagnosis not present

## 2016-10-09 DIAGNOSIS — N2581 Secondary hyperparathyroidism of renal origin: Secondary | ICD-10-CM | POA: Diagnosis not present

## 2016-10-09 DIAGNOSIS — Z23 Encounter for immunization: Secondary | ICD-10-CM | POA: Diagnosis not present

## 2016-10-09 DIAGNOSIS — D509 Iron deficiency anemia, unspecified: Secondary | ICD-10-CM | POA: Diagnosis not present

## 2016-10-09 DIAGNOSIS — N186 End stage renal disease: Secondary | ICD-10-CM | POA: Diagnosis not present

## 2016-10-11 DIAGNOSIS — N2581 Secondary hyperparathyroidism of renal origin: Secondary | ICD-10-CM | POA: Diagnosis not present

## 2016-10-11 DIAGNOSIS — D509 Iron deficiency anemia, unspecified: Secondary | ICD-10-CM | POA: Diagnosis not present

## 2016-10-11 DIAGNOSIS — D631 Anemia in chronic kidney disease: Secondary | ICD-10-CM | POA: Diagnosis not present

## 2016-10-11 DIAGNOSIS — N186 End stage renal disease: Secondary | ICD-10-CM | POA: Diagnosis not present

## 2016-10-11 DIAGNOSIS — E1129 Type 2 diabetes mellitus with other diabetic kidney complication: Secondary | ICD-10-CM | POA: Diagnosis not present

## 2016-10-11 DIAGNOSIS — Z23 Encounter for immunization: Secondary | ICD-10-CM | POA: Diagnosis not present

## 2016-10-13 DIAGNOSIS — Z23 Encounter for immunization: Secondary | ICD-10-CM | POA: Diagnosis not present

## 2016-10-13 DIAGNOSIS — N2581 Secondary hyperparathyroidism of renal origin: Secondary | ICD-10-CM | POA: Diagnosis not present

## 2016-10-13 DIAGNOSIS — D509 Iron deficiency anemia, unspecified: Secondary | ICD-10-CM | POA: Diagnosis not present

## 2016-10-13 DIAGNOSIS — N186 End stage renal disease: Secondary | ICD-10-CM | POA: Diagnosis not present

## 2016-10-13 DIAGNOSIS — D631 Anemia in chronic kidney disease: Secondary | ICD-10-CM | POA: Diagnosis not present

## 2016-10-13 DIAGNOSIS — E1129 Type 2 diabetes mellitus with other diabetic kidney complication: Secondary | ICD-10-CM | POA: Diagnosis not present

## 2016-10-16 DIAGNOSIS — E1129 Type 2 diabetes mellitus with other diabetic kidney complication: Secondary | ICD-10-CM | POA: Diagnosis not present

## 2016-10-16 DIAGNOSIS — D509 Iron deficiency anemia, unspecified: Secondary | ICD-10-CM | POA: Diagnosis not present

## 2016-10-16 DIAGNOSIS — Z23 Encounter for immunization: Secondary | ICD-10-CM | POA: Diagnosis not present

## 2016-10-16 DIAGNOSIS — N2581 Secondary hyperparathyroidism of renal origin: Secondary | ICD-10-CM | POA: Diagnosis not present

## 2016-10-16 DIAGNOSIS — D631 Anemia in chronic kidney disease: Secondary | ICD-10-CM | POA: Diagnosis not present

## 2016-10-16 DIAGNOSIS — N186 End stage renal disease: Secondary | ICD-10-CM | POA: Diagnosis not present

## 2016-10-18 DIAGNOSIS — N2581 Secondary hyperparathyroidism of renal origin: Secondary | ICD-10-CM | POA: Diagnosis not present

## 2016-10-18 DIAGNOSIS — Z23 Encounter for immunization: Secondary | ICD-10-CM | POA: Diagnosis not present

## 2016-10-18 DIAGNOSIS — N186 End stage renal disease: Secondary | ICD-10-CM | POA: Diagnosis not present

## 2016-10-18 DIAGNOSIS — D509 Iron deficiency anemia, unspecified: Secondary | ICD-10-CM | POA: Diagnosis not present

## 2016-10-18 DIAGNOSIS — D631 Anemia in chronic kidney disease: Secondary | ICD-10-CM | POA: Diagnosis not present

## 2016-10-18 DIAGNOSIS — E1129 Type 2 diabetes mellitus with other diabetic kidney complication: Secondary | ICD-10-CM | POA: Diagnosis not present

## 2016-10-19 ENCOUNTER — Ambulatory Visit (INDEPENDENT_AMBULATORY_CARE_PROVIDER_SITE_OTHER): Payer: Medicare Other | Admitting: *Deleted

## 2016-10-19 ENCOUNTER — Encounter (INDEPENDENT_AMBULATORY_CARE_PROVIDER_SITE_OTHER): Payer: Self-pay

## 2016-10-19 DIAGNOSIS — Z5181 Encounter for therapeutic drug level monitoring: Secondary | ICD-10-CM | POA: Diagnosis not present

## 2016-10-19 DIAGNOSIS — I779 Disorder of arteries and arterioles, unspecified: Secondary | ICD-10-CM | POA: Diagnosis not present

## 2016-10-19 DIAGNOSIS — I480 Paroxysmal atrial fibrillation: Secondary | ICD-10-CM

## 2016-10-19 LAB — POCT INR: INR: 1.8

## 2016-10-20 DIAGNOSIS — N186 End stage renal disease: Secondary | ICD-10-CM | POA: Diagnosis not present

## 2016-10-20 DIAGNOSIS — Z23 Encounter for immunization: Secondary | ICD-10-CM | POA: Diagnosis not present

## 2016-10-20 DIAGNOSIS — N2581 Secondary hyperparathyroidism of renal origin: Secondary | ICD-10-CM | POA: Diagnosis not present

## 2016-10-20 DIAGNOSIS — E1129 Type 2 diabetes mellitus with other diabetic kidney complication: Secondary | ICD-10-CM | POA: Diagnosis not present

## 2016-10-20 DIAGNOSIS — D631 Anemia in chronic kidney disease: Secondary | ICD-10-CM | POA: Diagnosis not present

## 2016-10-20 DIAGNOSIS — D509 Iron deficiency anemia, unspecified: Secondary | ICD-10-CM | POA: Diagnosis not present

## 2016-10-23 DIAGNOSIS — Z23 Encounter for immunization: Secondary | ICD-10-CM | POA: Diagnosis not present

## 2016-10-23 DIAGNOSIS — E1122 Type 2 diabetes mellitus with diabetic chronic kidney disease: Secondary | ICD-10-CM | POA: Diagnosis not present

## 2016-10-23 DIAGNOSIS — N186 End stage renal disease: Secondary | ICD-10-CM | POA: Diagnosis not present

## 2016-10-23 DIAGNOSIS — N2581 Secondary hyperparathyroidism of renal origin: Secondary | ICD-10-CM | POA: Diagnosis not present

## 2016-10-23 DIAGNOSIS — D509 Iron deficiency anemia, unspecified: Secondary | ICD-10-CM | POA: Diagnosis not present

## 2016-10-23 DIAGNOSIS — Z992 Dependence on renal dialysis: Secondary | ICD-10-CM | POA: Diagnosis not present

## 2016-10-23 DIAGNOSIS — E1129 Type 2 diabetes mellitus with other diabetic kidney complication: Secondary | ICD-10-CM | POA: Diagnosis not present

## 2016-10-23 DIAGNOSIS — D631 Anemia in chronic kidney disease: Secondary | ICD-10-CM | POA: Diagnosis not present

## 2016-10-25 DIAGNOSIS — D509 Iron deficiency anemia, unspecified: Secondary | ICD-10-CM | POA: Diagnosis not present

## 2016-10-25 DIAGNOSIS — E1129 Type 2 diabetes mellitus with other diabetic kidney complication: Secondary | ICD-10-CM | POA: Diagnosis not present

## 2016-10-25 DIAGNOSIS — N186 End stage renal disease: Secondary | ICD-10-CM | POA: Diagnosis not present

## 2016-10-25 DIAGNOSIS — N2581 Secondary hyperparathyroidism of renal origin: Secondary | ICD-10-CM | POA: Diagnosis not present

## 2016-10-27 DIAGNOSIS — E1129 Type 2 diabetes mellitus with other diabetic kidney complication: Secondary | ICD-10-CM | POA: Diagnosis not present

## 2016-10-27 DIAGNOSIS — N2581 Secondary hyperparathyroidism of renal origin: Secondary | ICD-10-CM | POA: Diagnosis not present

## 2016-10-27 DIAGNOSIS — D509 Iron deficiency anemia, unspecified: Secondary | ICD-10-CM | POA: Diagnosis not present

## 2016-10-27 DIAGNOSIS — N186 End stage renal disease: Secondary | ICD-10-CM | POA: Diagnosis not present

## 2016-10-30 DIAGNOSIS — D509 Iron deficiency anemia, unspecified: Secondary | ICD-10-CM | POA: Diagnosis not present

## 2016-10-30 DIAGNOSIS — E1129 Type 2 diabetes mellitus with other diabetic kidney complication: Secondary | ICD-10-CM | POA: Diagnosis not present

## 2016-10-30 DIAGNOSIS — N186 End stage renal disease: Secondary | ICD-10-CM | POA: Diagnosis not present

## 2016-10-30 DIAGNOSIS — N2581 Secondary hyperparathyroidism of renal origin: Secondary | ICD-10-CM | POA: Diagnosis not present

## 2016-11-01 DIAGNOSIS — D509 Iron deficiency anemia, unspecified: Secondary | ICD-10-CM | POA: Diagnosis not present

## 2016-11-01 DIAGNOSIS — N186 End stage renal disease: Secondary | ICD-10-CM | POA: Diagnosis not present

## 2016-11-01 DIAGNOSIS — N2581 Secondary hyperparathyroidism of renal origin: Secondary | ICD-10-CM | POA: Diagnosis not present

## 2016-11-01 DIAGNOSIS — E1129 Type 2 diabetes mellitus with other diabetic kidney complication: Secondary | ICD-10-CM | POA: Diagnosis not present

## 2016-11-03 DIAGNOSIS — N186 End stage renal disease: Secondary | ICD-10-CM | POA: Diagnosis not present

## 2016-11-03 DIAGNOSIS — E1129 Type 2 diabetes mellitus with other diabetic kidney complication: Secondary | ICD-10-CM | POA: Diagnosis not present

## 2016-11-03 DIAGNOSIS — D509 Iron deficiency anemia, unspecified: Secondary | ICD-10-CM | POA: Diagnosis not present

## 2016-11-03 DIAGNOSIS — N2581 Secondary hyperparathyroidism of renal origin: Secondary | ICD-10-CM | POA: Diagnosis not present

## 2016-11-07 ENCOUNTER — Ambulatory Visit (INDEPENDENT_AMBULATORY_CARE_PROVIDER_SITE_OTHER): Payer: Medicare Other | Admitting: Podiatry

## 2016-11-07 ENCOUNTER — Encounter: Payer: Self-pay | Admitting: Podiatry

## 2016-11-07 VITALS — BP 155/60 | HR 69

## 2016-11-07 DIAGNOSIS — B351 Tinea unguium: Secondary | ICD-10-CM

## 2016-11-07 DIAGNOSIS — M79676 Pain in unspecified toe(s): Secondary | ICD-10-CM | POA: Diagnosis not present

## 2016-11-07 DIAGNOSIS — E1151 Type 2 diabetes mellitus with diabetic peripheral angiopathy without gangrene: Secondary | ICD-10-CM | POA: Diagnosis not present

## 2016-11-07 NOTE — Patient Instructions (Signed)
Apply Vaseline to the previous skin also in the back of the left heel. Attached foam around the heel with Coflex tape Elevate the left calf on a pillow  Diabetes and Foot Care Diabetes may cause you to have problems because of poor blood supply (circulation) to your feet and legs. This may cause the skin on your feet to become thinner, break easier, and heal more slowly. Your skin may become dry, and the skin may peel and crack. You may also have nerve damage in your legs and feet causing decreased feeling in them. You may not notice minor injuries to your feet that could lead to infections or more serious problems. Taking care of your feet is one of the most important things you can do for yourself. Follow these instructions at home:  Wear shoes at all times, even in the house. Do not go barefoot. Bare feet are easily injured.  Check your feet daily for blisters, cuts, and redness. If you cannot see the bottom of your feet, use a mirror or ask someone for help.  Wash your feet with warm water (do not use hot water) and mild soap. Then pat your feet and the areas between your toes until they are completely dry. Do not soak your feet as this can dry your skin.  Apply a moisturizing lotion or petroleum jelly (that does not contain alcohol and is unscented) to the skin on your feet and to dry, brittle toenails. Do not apply lotion between your toes.  Trim your toenails straight across. Do not dig under them or around the cuticle. File the edges of your nails with an emery board or nail file.  Do not cut corns or calluses or try to remove them with medicine.  Wear clean socks or stockings every day. Make sure they are not too tight. Do not wear knee-high stockings since they may decrease blood flow to your legs.  Wear shoes that fit properly and have enough cushioning. To break in new shoes, wear them for just a few hours a day. This prevents you from injuring your feet. Always look in your shoes  before you put them on to be sure there are no objects inside.  Do not cross your legs. This may decrease the blood flow to your feet.  If you find a minor scrape, cut, or break in the skin on your feet, keep it and the skin around it clean and dry. These areas may be cleansed with mild soap and water. Do not cleanse the area with peroxide, alcohol, or iodine.  When you remove an adhesive bandage, be sure not to damage the skin around it.  If you have a wound, look at it several times a day to make sure it is healing.  Do not use heating pads or hot water bottles. They may burn your skin. If you have lost feeling in your feet or legs, you may not know it is happening until it is too late.  Make sure your health care provider performs a complete foot exam at least annually or more often if you have foot problems. Report any cuts, sores, or bruises to your health care provider immediately. Contact a health care provider if:  You have an injury that is not healing.  You have cuts or breaks in the skin.  You have an ingrown nail.  You notice redness on your legs or feet.  You feel burning or tingling in your legs or feet.  You have  pain or cramps in your legs and feet.  Your legs or feet are numb.  Your feet always feel cold. Get help right away if:  There is increasing redness, swelling, or pain in or around a wound.  There is a red line that goes up your leg.  Pus is coming from a wound.  You develop a fever or as directed by your health care provider.  You notice a bad smell coming from an ulcer or wound. This information is not intended to replace advice given to you by your health care provider. Make sure you discuss any questions you have with your health care provider. Document Released: 06/09/2000 Document Revised: 11/18/2015 Document Reviewed: 11/19/2012 Elsevier Interactive Patient Education  2017 [employer].

## 2016-11-07 NOTE — Progress Notes (Signed)
Patient ID: [name], male   DOB: 1933-10-13, 83 y.o.   MRN: [id number]   Subjective: This patient presents for a scheduled visit complaining that his toenails are thickened and elongated request toenail debridement. Patient also under care and wound care center for the past 3 weeks for skin ulcer on left heel. The patient's wife describes weekly application of a debriding enzyme and dressing and patient has scheduled visit for skin ulcer today Patient is a diabetic with a history of peripheral arterial disease  Objective: Patient is slow to respond DP right 2/4 DP left 1/4 PT pulses nonpalpable bilaterally Capillary reflex delayed bilaterally Gauze dressing on left ankle and heel for ongoing ulcer under treatment of wound care Patient wearing boot on r left foot Plantar callus fifth MPJ right HAV right Hammertoe second bilaterally Atrophic skin bilaterally Toenails are elongated brittle, deformed and tender to direct palpation eschar posterior left heel underlying closed lesion callused posterior left heel   Assessment: Diabetic with peripheral arterial disease Pre ulcerative callus Symptomatic mycotic toenails 6-10  Plan: Debridement toenails 6-10 mechanically analytic without any bleeding apply  vaseline to the pre ucerative on the left heel and cover with foam pad  Reappoint 3 months

## 2016-11-10 DIAGNOSIS — N2581 Secondary hyperparathyroidism of renal origin: Secondary | ICD-10-CM | POA: Diagnosis not present

## 2016-11-10 DIAGNOSIS — E1129 Type 2 diabetes mellitus with other diabetic kidney complication: Secondary | ICD-10-CM | POA: Diagnosis not present

## 2016-11-10 DIAGNOSIS — D509 Iron deficiency anemia, unspecified: Secondary | ICD-10-CM | POA: Diagnosis not present

## 2016-11-10 DIAGNOSIS — N186 End stage renal disease: Secondary | ICD-10-CM | POA: Diagnosis not present

## 2016-11-13 DIAGNOSIS — D509 Iron deficiency anemia, unspecified: Secondary | ICD-10-CM | POA: Diagnosis not present

## 2016-11-13 DIAGNOSIS — N186 End stage renal disease: Secondary | ICD-10-CM | POA: Diagnosis not present

## 2016-11-13 DIAGNOSIS — N2581 Secondary hyperparathyroidism of renal origin: Secondary | ICD-10-CM | POA: Diagnosis not present

## 2016-11-13 DIAGNOSIS — E1129 Type 2 diabetes mellitus with other diabetic kidney complication: Secondary | ICD-10-CM | POA: Diagnosis not present

## 2016-11-15 DIAGNOSIS — D509 Iron deficiency anemia, unspecified: Secondary | ICD-10-CM | POA: Diagnosis not present

## 2016-11-15 DIAGNOSIS — N2581 Secondary hyperparathyroidism of renal origin: Secondary | ICD-10-CM | POA: Diagnosis not present

## 2016-11-15 DIAGNOSIS — N186 End stage renal disease: Secondary | ICD-10-CM | POA: Diagnosis not present

## 2016-11-15 DIAGNOSIS — E1129 Type 2 diabetes mellitus with other diabetic kidney complication: Secondary | ICD-10-CM | POA: Diagnosis not present

## 2016-11-16 ENCOUNTER — Ambulatory Visit (INDEPENDENT_AMBULATORY_CARE_PROVIDER_SITE_OTHER): Payer: Medicare Other | Admitting: Pharmacist

## 2016-11-16 DIAGNOSIS — I779 Disorder of arteries and arterioles, unspecified: Secondary | ICD-10-CM

## 2016-11-16 DIAGNOSIS — I480 Paroxysmal atrial fibrillation: Secondary | ICD-10-CM | POA: Diagnosis not present

## 2016-11-16 DIAGNOSIS — Z5181 Encounter for therapeutic drug level monitoring: Secondary | ICD-10-CM

## 2016-11-16 LAB — POCT INR: INR: 1.6

## 2016-11-17 DIAGNOSIS — N2581 Secondary hyperparathyroidism of renal origin: Secondary | ICD-10-CM | POA: Diagnosis not present

## 2016-11-17 DIAGNOSIS — E1129 Type 2 diabetes mellitus with other diabetic kidney complication: Secondary | ICD-10-CM | POA: Diagnosis not present

## 2016-11-17 DIAGNOSIS — D509 Iron deficiency anemia, unspecified: Secondary | ICD-10-CM | POA: Diagnosis not present

## 2016-11-17 DIAGNOSIS — N186 End stage renal disease: Secondary | ICD-10-CM | POA: Diagnosis not present

## 2016-11-20 DIAGNOSIS — E1129 Type 2 diabetes mellitus with other diabetic kidney complication: Secondary | ICD-10-CM | POA: Diagnosis not present

## 2016-11-20 DIAGNOSIS — N2581 Secondary hyperparathyroidism of renal origin: Secondary | ICD-10-CM | POA: Diagnosis not present

## 2016-11-20 DIAGNOSIS — N186 End stage renal disease: Secondary | ICD-10-CM | POA: Diagnosis not present

## 2016-11-20 DIAGNOSIS — D509 Iron deficiency anemia, unspecified: Secondary | ICD-10-CM | POA: Diagnosis not present

## 2016-11-22 DIAGNOSIS — N186 End stage renal disease: Secondary | ICD-10-CM | POA: Diagnosis not present

## 2016-11-22 DIAGNOSIS — D509 Iron deficiency anemia, unspecified: Secondary | ICD-10-CM | POA: Diagnosis not present

## 2016-11-22 DIAGNOSIS — N2581 Secondary hyperparathyroidism of renal origin: Secondary | ICD-10-CM | POA: Diagnosis not present

## 2016-11-22 DIAGNOSIS — E1129 Type 2 diabetes mellitus with other diabetic kidney complication: Secondary | ICD-10-CM | POA: Diagnosis not present

## 2016-11-23 DIAGNOSIS — Z992 Dependence on renal dialysis: Secondary | ICD-10-CM | POA: Diagnosis not present

## 2016-11-23 DIAGNOSIS — N186 End stage renal disease: Secondary | ICD-10-CM | POA: Diagnosis not present

## 2016-11-23 DIAGNOSIS — E1122 Type 2 diabetes mellitus with diabetic chronic kidney disease: Secondary | ICD-10-CM | POA: Diagnosis not present

## 2016-11-24 DIAGNOSIS — R319 Hematuria, unspecified: Secondary | ICD-10-CM | POA: Diagnosis not present

## 2016-11-24 DIAGNOSIS — D509 Iron deficiency anemia, unspecified: Secondary | ICD-10-CM | POA: Diagnosis not present

## 2016-11-24 DIAGNOSIS — N186 End stage renal disease: Secondary | ICD-10-CM | POA: Diagnosis not present

## 2016-11-24 DIAGNOSIS — E1129 Type 2 diabetes mellitus with other diabetic kidney complication: Secondary | ICD-10-CM | POA: Diagnosis not present

## 2016-11-24 DIAGNOSIS — N2581 Secondary hyperparathyroidism of renal origin: Secondary | ICD-10-CM | POA: Diagnosis not present

## 2016-11-30 ENCOUNTER — Encounter (INDEPENDENT_AMBULATORY_CARE_PROVIDER_SITE_OTHER): Payer: Self-pay

## 2016-11-30 ENCOUNTER — Ambulatory Visit (INDEPENDENT_AMBULATORY_CARE_PROVIDER_SITE_OTHER): Payer: Medicare Other | Admitting: *Deleted

## 2016-11-30 DIAGNOSIS — F39 Unspecified mood [affective] disorder: Secondary | ICD-10-CM | POA: Diagnosis not present

## 2016-11-30 DIAGNOSIS — I480 Paroxysmal atrial fibrillation: Secondary | ICD-10-CM | POA: Diagnosis not present

## 2016-11-30 DIAGNOSIS — Z5181 Encounter for therapeutic drug level monitoring: Secondary | ICD-10-CM

## 2016-11-30 DIAGNOSIS — I779 Disorder of arteries and arterioles, unspecified: Secondary | ICD-10-CM

## 2016-11-30 DIAGNOSIS — I2510 Atherosclerotic heart disease of native coronary artery without angina pectoris: Secondary | ICD-10-CM | POA: Diagnosis not present

## 2016-11-30 DIAGNOSIS — E1151 Type 2 diabetes mellitus with diabetic peripheral angiopathy without gangrene: Secondary | ICD-10-CM | POA: Diagnosis not present

## 2016-11-30 DIAGNOSIS — Z1389 Encounter for screening for other disorder: Secondary | ICD-10-CM | POA: Diagnosis not present

## 2016-11-30 DIAGNOSIS — Z6826 Body mass index (BMI) 26.0-26.9, adult: Secondary | ICD-10-CM | POA: Diagnosis not present

## 2016-11-30 DIAGNOSIS — R2689 Other abnormalities of gait and mobility: Secondary | ICD-10-CM | POA: Diagnosis not present

## 2016-11-30 DIAGNOSIS — G308 Other Alzheimer's disease: Secondary | ICD-10-CM | POA: Diagnosis not present

## 2016-11-30 DIAGNOSIS — N185 Chronic kidney disease, stage 5: Secondary | ICD-10-CM | POA: Diagnosis not present

## 2016-11-30 LAB — POCT INR: INR: 2.1

## 2016-12-01 DIAGNOSIS — E1129 Type 2 diabetes mellitus with other diabetic kidney complication: Secondary | ICD-10-CM | POA: Diagnosis not present

## 2016-12-01 DIAGNOSIS — D509 Iron deficiency anemia, unspecified: Secondary | ICD-10-CM | POA: Diagnosis not present

## 2016-12-01 DIAGNOSIS — R319 Hematuria, unspecified: Secondary | ICD-10-CM | POA: Diagnosis not present

## 2016-12-01 DIAGNOSIS — N2581 Secondary hyperparathyroidism of renal origin: Secondary | ICD-10-CM | POA: Diagnosis not present

## 2016-12-01 DIAGNOSIS — N186 End stage renal disease: Secondary | ICD-10-CM | POA: Diagnosis not present

## 2016-12-01 IMAGING — DX DG CHEST 2V
2 series · 2 of 2 positions shown · non-contrast
Comparison: Chest x-ray 09/27/2015.

CLINICAL DATA: 81-year-old male with history of left-sided chest
pain today while at work, with some shortness of breath and
radiation of the pain down into the left arm.

EXAM:
CHEST  2 VIEW

[w chest pa]
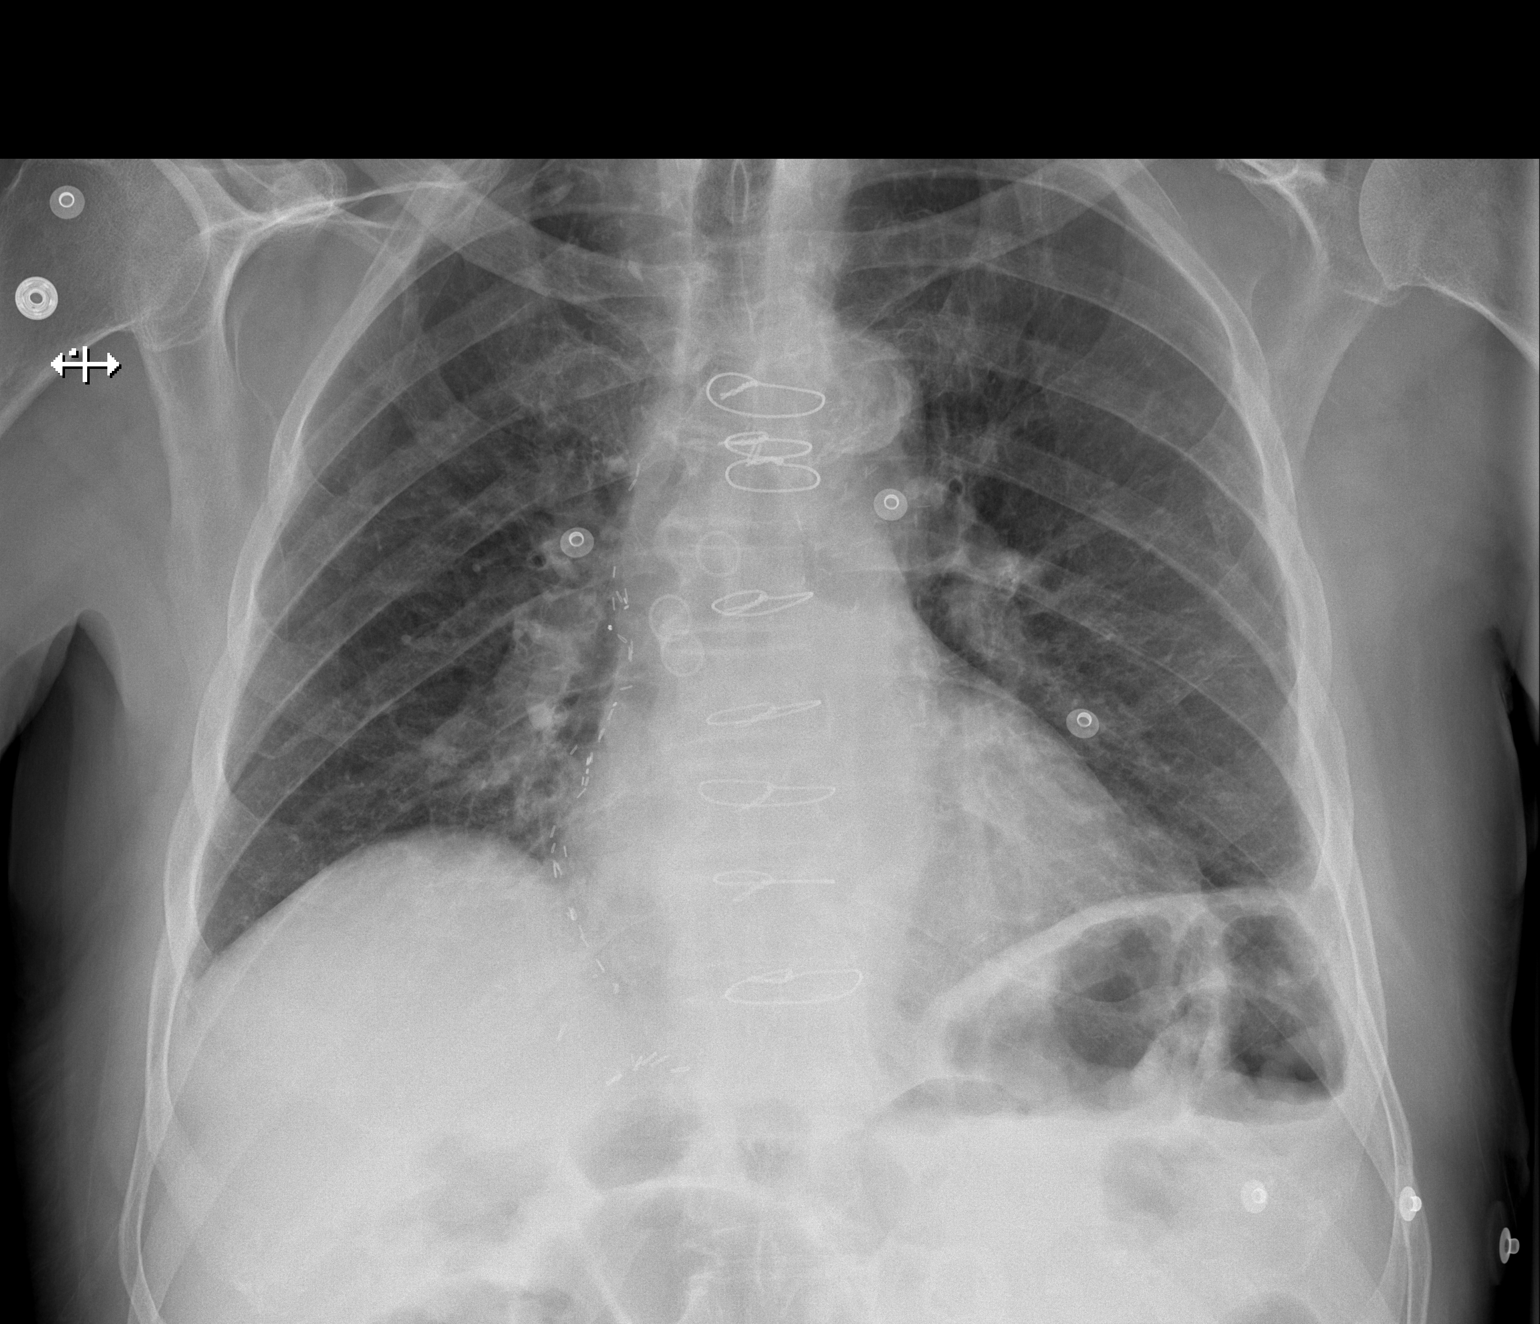

[w chest lat]
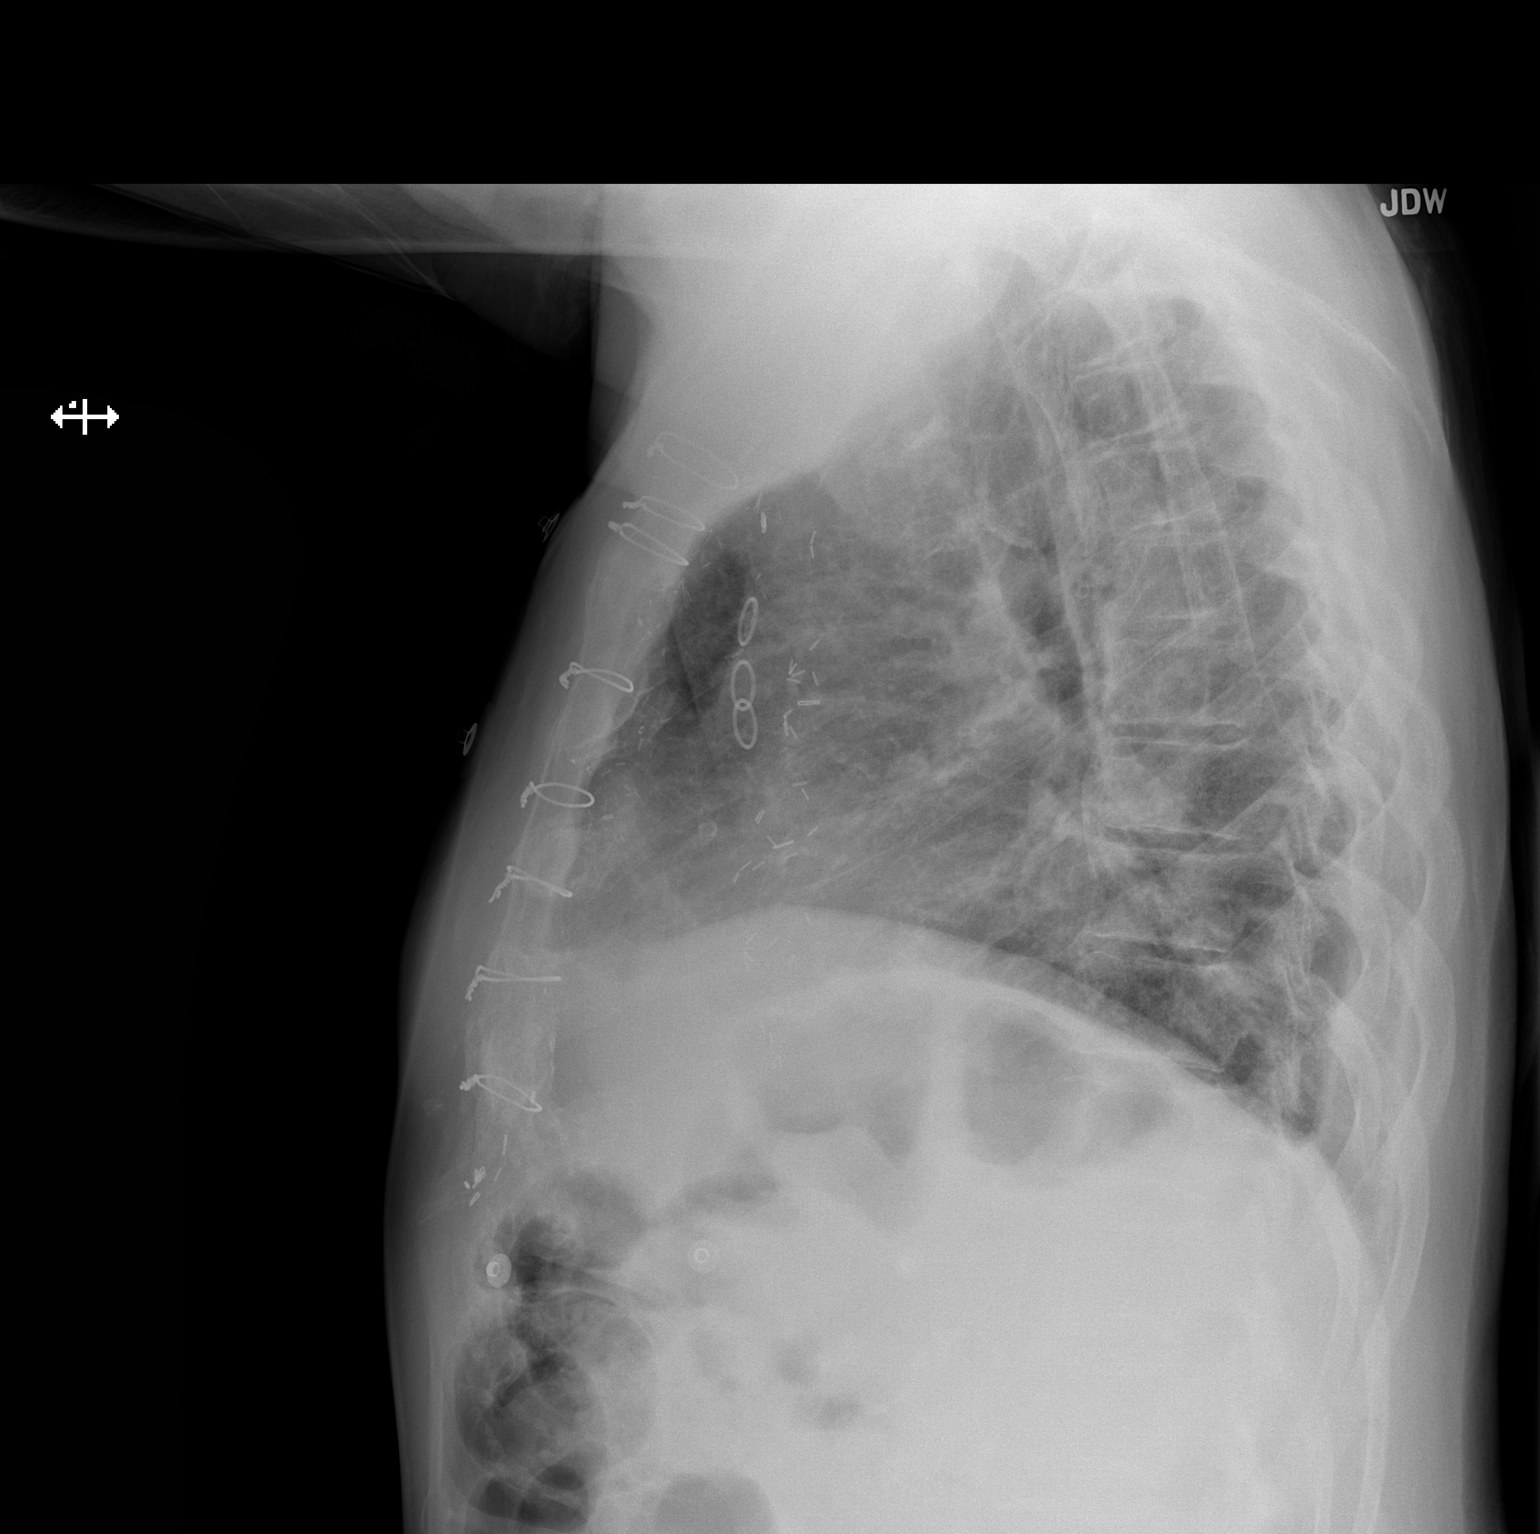

[2 of 2 positions shown; findings below may reference images not displayed]

FINDINGS: Lung volumes are slightly low. No acute consolidative airspace
disease. No pleural effusions. Mild interstitial prominence appears
to be chronic. No evidence of pulmonary edema. Heart size is normal.
Upper mediastinal contours are within normal limits. Aortic
atherosclerosis. Status post median sternotomy for CABG.
IMPRESSION: 1. No radiographic evidence of acute cardiopulmonary disease.
2. Aortic atherosclerosis.

## 2016-12-04 DIAGNOSIS — E1129 Type 2 diabetes mellitus with other diabetic kidney complication: Secondary | ICD-10-CM | POA: Diagnosis not present

## 2016-12-04 DIAGNOSIS — D509 Iron deficiency anemia, unspecified: Secondary | ICD-10-CM | POA: Diagnosis not present

## 2016-12-04 DIAGNOSIS — N2581 Secondary hyperparathyroidism of renal origin: Secondary | ICD-10-CM | POA: Diagnosis not present

## 2016-12-04 DIAGNOSIS — R319 Hematuria, unspecified: Secondary | ICD-10-CM | POA: Diagnosis not present

## 2016-12-04 DIAGNOSIS — N186 End stage renal disease: Secondary | ICD-10-CM | POA: Diagnosis not present

## 2016-12-06 DIAGNOSIS — D509 Iron deficiency anemia, unspecified: Secondary | ICD-10-CM | POA: Diagnosis not present

## 2016-12-06 DIAGNOSIS — E1129 Type 2 diabetes mellitus with other diabetic kidney complication: Secondary | ICD-10-CM | POA: Diagnosis not present

## 2016-12-06 DIAGNOSIS — N186 End stage renal disease: Secondary | ICD-10-CM | POA: Diagnosis not present

## 2016-12-06 DIAGNOSIS — R319 Hematuria, unspecified: Secondary | ICD-10-CM | POA: Diagnosis not present

## 2016-12-06 DIAGNOSIS — N2581 Secondary hyperparathyroidism of renal origin: Secondary | ICD-10-CM | POA: Diagnosis not present

## 2016-12-07 DIAGNOSIS — E113393 Type 2 diabetes mellitus with moderate nonproliferative diabetic retinopathy without macular edema, bilateral: Secondary | ICD-10-CM | POA: Diagnosis not present

## 2016-12-07 DIAGNOSIS — H43813 Vitreous degeneration, bilateral: Secondary | ICD-10-CM | POA: Diagnosis not present

## 2016-12-07 DIAGNOSIS — H35071 Retinal telangiectasis, right eye: Secondary | ICD-10-CM | POA: Diagnosis not present

## 2016-12-07 DIAGNOSIS — H35043 Retinal micro-aneurysms, unspecified, bilateral: Secondary | ICD-10-CM | POA: Diagnosis not present

## 2016-12-08 DIAGNOSIS — R319 Hematuria, unspecified: Secondary | ICD-10-CM | POA: Diagnosis not present

## 2016-12-08 DIAGNOSIS — N2581 Secondary hyperparathyroidism of renal origin: Secondary | ICD-10-CM | POA: Diagnosis not present

## 2016-12-08 DIAGNOSIS — D509 Iron deficiency anemia, unspecified: Secondary | ICD-10-CM | POA: Diagnosis not present

## 2016-12-08 DIAGNOSIS — E1129 Type 2 diabetes mellitus with other diabetic kidney complication: Secondary | ICD-10-CM | POA: Diagnosis not present

## 2016-12-08 DIAGNOSIS — N186 End stage renal disease: Secondary | ICD-10-CM | POA: Diagnosis not present

## 2016-12-13 DIAGNOSIS — D509 Iron deficiency anemia, unspecified: Secondary | ICD-10-CM | POA: Diagnosis not present

## 2016-12-13 DIAGNOSIS — E1129 Type 2 diabetes mellitus with other diabetic kidney complication: Secondary | ICD-10-CM | POA: Diagnosis not present

## 2016-12-13 DIAGNOSIS — N2581 Secondary hyperparathyroidism of renal origin: Secondary | ICD-10-CM | POA: Diagnosis not present

## 2016-12-13 DIAGNOSIS — N186 End stage renal disease: Secondary | ICD-10-CM | POA: Diagnosis not present

## 2016-12-13 DIAGNOSIS — R319 Hematuria, unspecified: Secondary | ICD-10-CM | POA: Diagnosis not present

## 2016-12-15 ENCOUNTER — Other Ambulatory Visit: Payer: Self-pay | Admitting: Cardiovascular Disease

## 2016-12-15 DIAGNOSIS — R319 Hematuria, unspecified: Secondary | ICD-10-CM | POA: Diagnosis not present

## 2016-12-15 DIAGNOSIS — N186 End stage renal disease: Secondary | ICD-10-CM | POA: Diagnosis not present

## 2016-12-15 DIAGNOSIS — E1129 Type 2 diabetes mellitus with other diabetic kidney complication: Secondary | ICD-10-CM | POA: Diagnosis not present

## 2016-12-15 DIAGNOSIS — N2581 Secondary hyperparathyroidism of renal origin: Secondary | ICD-10-CM | POA: Diagnosis not present

## 2016-12-15 DIAGNOSIS — D509 Iron deficiency anemia, unspecified: Secondary | ICD-10-CM | POA: Diagnosis not present

## 2016-12-20 DIAGNOSIS — N186 End stage renal disease: Secondary | ICD-10-CM | POA: Diagnosis not present

## 2016-12-20 DIAGNOSIS — D509 Iron deficiency anemia, unspecified: Secondary | ICD-10-CM | POA: Diagnosis not present

## 2016-12-20 DIAGNOSIS — E1129 Type 2 diabetes mellitus with other diabetic kidney complication: Secondary | ICD-10-CM | POA: Diagnosis not present

## 2016-12-20 DIAGNOSIS — N2581 Secondary hyperparathyroidism of renal origin: Secondary | ICD-10-CM | POA: Diagnosis not present

## 2016-12-20 DIAGNOSIS — R319 Hematuria, unspecified: Secondary | ICD-10-CM | POA: Diagnosis not present

## 2016-12-21 ENCOUNTER — Ambulatory Visit (INDEPENDENT_AMBULATORY_CARE_PROVIDER_SITE_OTHER): Payer: Medicare Other | Admitting: *Deleted

## 2016-12-21 DIAGNOSIS — I480 Paroxysmal atrial fibrillation: Secondary | ICD-10-CM | POA: Diagnosis not present

## 2016-12-21 DIAGNOSIS — I779 Disorder of arteries and arterioles, unspecified: Secondary | ICD-10-CM

## 2016-12-21 DIAGNOSIS — Z5181 Encounter for therapeutic drug level monitoring: Secondary | ICD-10-CM | POA: Diagnosis not present

## 2016-12-21 LAB — POCT INR: INR: 1.9

## 2016-12-22 DIAGNOSIS — N2581 Secondary hyperparathyroidism of renal origin: Secondary | ICD-10-CM | POA: Diagnosis not present

## 2016-12-22 DIAGNOSIS — D509 Iron deficiency anemia, unspecified: Secondary | ICD-10-CM | POA: Diagnosis not present

## 2016-12-22 DIAGNOSIS — N186 End stage renal disease: Secondary | ICD-10-CM | POA: Diagnosis not present

## 2016-12-22 DIAGNOSIS — E1129 Type 2 diabetes mellitus with other diabetic kidney complication: Secondary | ICD-10-CM | POA: Diagnosis not present

## 2016-12-22 DIAGNOSIS — R319 Hematuria, unspecified: Secondary | ICD-10-CM | POA: Diagnosis not present

## 2016-12-23 DIAGNOSIS — N186 End stage renal disease: Secondary | ICD-10-CM | POA: Diagnosis not present

## 2016-12-23 DIAGNOSIS — Z992 Dependence on renal dialysis: Secondary | ICD-10-CM | POA: Diagnosis not present

## 2016-12-23 DIAGNOSIS — E1122 Type 2 diabetes mellitus with diabetic chronic kidney disease: Secondary | ICD-10-CM | POA: Diagnosis not present

## 2016-12-25 DIAGNOSIS — D509 Iron deficiency anemia, unspecified: Secondary | ICD-10-CM | POA: Diagnosis not present

## 2016-12-25 DIAGNOSIS — N2581 Secondary hyperparathyroidism of renal origin: Secondary | ICD-10-CM | POA: Diagnosis not present

## 2016-12-25 DIAGNOSIS — E1129 Type 2 diabetes mellitus with other diabetic kidney complication: Secondary | ICD-10-CM | POA: Diagnosis not present

## 2016-12-25 DIAGNOSIS — N186 End stage renal disease: Secondary | ICD-10-CM | POA: Diagnosis not present

## 2016-12-29 DIAGNOSIS — N186 End stage renal disease: Secondary | ICD-10-CM | POA: Diagnosis not present

## 2016-12-29 DIAGNOSIS — D509 Iron deficiency anemia, unspecified: Secondary | ICD-10-CM | POA: Diagnosis not present

## 2016-12-29 DIAGNOSIS — N2581 Secondary hyperparathyroidism of renal origin: Secondary | ICD-10-CM | POA: Diagnosis not present

## 2016-12-29 DIAGNOSIS — E1129 Type 2 diabetes mellitus with other diabetic kidney complication: Secondary | ICD-10-CM | POA: Diagnosis not present

## 2016-12-30 ENCOUNTER — Encounter (HOSPITAL_COMMUNITY): Payer: Self-pay | Admitting: *Deleted

## 2016-12-30 ENCOUNTER — Emergency Department (HOSPITAL_COMMUNITY)
Admission: EM | Admit: 2016-12-30 | Discharge: 2016-12-30 | Disposition: A | Discharge status: Home / Self Care | Payer: Medicare Other | Attending: Emergency Medicine | Admitting: Emergency Medicine

## 2016-12-30 ENCOUNTER — Emergency Department (HOSPITAL_COMMUNITY): Payer: Medicare Other

## 2016-12-30 DIAGNOSIS — Z794 Long term (current) use of insulin: Secondary | ICD-10-CM | POA: Insufficient documentation

## 2016-12-30 DIAGNOSIS — I5043 Acute on chronic combined systolic (congestive) and diastolic (congestive) heart failure: Secondary | ICD-10-CM | POA: Insufficient documentation

## 2016-12-30 DIAGNOSIS — R0989 Other specified symptoms and signs involving the circulatory and respiratory systems: Secondary | ICD-10-CM | POA: Diagnosis not present

## 2016-12-30 DIAGNOSIS — N184 Chronic kidney disease, stage 4 (severe): Secondary | ICD-10-CM | POA: Diagnosis not present

## 2016-12-30 DIAGNOSIS — E039 Hypothyroidism, unspecified: Secondary | ICD-10-CM | POA: Diagnosis not present

## 2016-12-30 DIAGNOSIS — R0602 Shortness of breath: Secondary | ICD-10-CM

## 2016-12-30 DIAGNOSIS — Z7901 Long term (current) use of anticoagulants: Secondary | ICD-10-CM | POA: Diagnosis not present

## 2016-12-30 DIAGNOSIS — Z951 Presence of aortocoronary bypass graft: Secondary | ICD-10-CM | POA: Diagnosis not present

## 2016-12-30 DIAGNOSIS — E1122 Type 2 diabetes mellitus with diabetic chronic kidney disease: Secondary | ICD-10-CM | POA: Insufficient documentation

## 2016-12-30 DIAGNOSIS — I129 Hypertensive chronic kidney disease with stage 1 through stage 4 chronic kidney disease, or unspecified chronic kidney disease: Secondary | ICD-10-CM | POA: Insufficient documentation

## 2016-12-30 DIAGNOSIS — I2510 Atherosclerotic heart disease of native coronary artery without angina pectoris: Secondary | ICD-10-CM | POA: Insufficient documentation

## 2016-12-30 DIAGNOSIS — Z91040 Latex allergy status: Secondary | ICD-10-CM | POA: Insufficient documentation

## 2016-12-30 DIAGNOSIS — Z79899 Other long term (current) drug therapy: Secondary | ICD-10-CM | POA: Diagnosis not present

## 2016-12-30 LAB — CBC WITH DIFFERENTIAL/PLATELET
Basophils Absolute: 0 10*3/uL (ref 0.0–0.1)
Basophils Relative: 0 %
Eosinophils Absolute: 0.1 10*3/uL (ref 0.0–0.7)
Eosinophils Relative: 3 %
HEMATOCRIT: 35.9 % — AB (ref 39.0–52.0)
HEMOGLOBIN: 11.6 g/dL — AB (ref 13.0–17.0)
LYMPHS ABS: 0.9 10*3/uL (ref 0.7–4.0)
LYMPHS PCT: 20 %
MCH: 31.6 pg (ref 26.0–34.0)
MCHC: 32.3 g/dL (ref 30.0–36.0)
MCV: 97.8 fL (ref 78.0–100.0)
Monocytes Absolute: 0.3 10*3/uL (ref 0.1–1.0)
Monocytes Relative: 6 %
NEUTROS PCT: 71 %
Neutro Abs: 3.3 10*3/uL (ref 1.7–7.7)
Platelets: 121 10*3/uL — ABNORMAL LOW (ref 150–400)
RBC: 3.67 MIL/uL — AB (ref 4.22–5.81)
RDW: 14.4 % (ref 11.5–15.5)
WBC: 4.6 10*3/uL (ref 4.0–10.5)

## 2016-12-30 LAB — COMPREHENSIVE METABOLIC PANEL
ALK PHOS: 89 U/L (ref 38–126)
ALT: 21 U/L (ref 17–63)
AST: 21 U/L (ref 15–41)
Albumin: 3.8 g/dL (ref 3.5–5.0)
Anion gap: 9 (ref 5–15)
BUN: 42 mg/dL — ABNORMAL HIGH (ref 6–20)
CO2: 31 mmol/L (ref 22–32)
CREATININE: 4.58 mg/dL — AB (ref 0.61–1.24)
Calcium: 8.7 mg/dL — ABNORMAL LOW (ref 8.9–10.3)
Chloride: 100 mmol/L — ABNORMAL LOW (ref 101–111)
GFR, EST AFRICAN AMERICAN: 13 mL/min — AB (ref >60)
GFR, EST NON AFRICAN AMERICAN: 11 mL/min — AB (ref >60)
Glucose, Bld: 130 mg/dL — ABNORMAL HIGH (ref 65–99)
Potassium: 4.5 mmol/L (ref 3.5–5.1)
Sodium: 140 mmol/L (ref 135–145)
Total Bilirubin: 1 mg/dL (ref 0.3–1.2)
Total Protein: 6.3 g/dL — ABNORMAL LOW (ref 6.5–8.1)

## 2016-12-30 LAB — TROPONIN I
TROPONIN I: 0.06 ng/mL — AB (ref <0.03)
Troponin I: 0.05 ng/mL (ref <0.03)

## 2016-12-30 LAB — PROTIME-INR
INR: 2.09
PROTHROMBIN TIME: 23.8 s — AB (ref 11.4–15.2)

## 2016-12-30 MED ORDER — ALBUTEROL SULFATE HFA 108 (90 BASE) MCG/ACT IN AERS: 2.0000 | INHALATION_SPRAY | RESPIRATORY_TRACT | 0 refills | Status: DC | PRN

## 2016-12-30 MED ORDER — IPRATROPIUM-ALBUTEROL 0.5-2.5 (3) MG/3ML IN SOLN
3.0000 mL | Freq: Once | RESPIRATORY_TRACT | Status: AC
  Administered 2016-12-30: 3 mL via RESPIRATORY_TRACT
  Filled 2016-12-30: qty 3

## 2016-12-30 NOTE — ED Notes (Signed)
Assisted pt using b edsode commode, no difficulties.

## 2016-12-30 NOTE — Discharge Instructions (Signed)
See your Physician for recheck on Monday °

## 2016-12-30 NOTE — ED Notes (Signed)
PA at bedside.

## 2016-12-30 NOTE — ED Triage Notes (Signed)
States he was awaken at 545 this am sob denies chest pain or cough. States he had his normal dialysis yest.

## 2016-12-30 NOTE — ED Provider Notes (Signed)
[facility] DEPT Provider Note   CSN: [id number] Arrival date & time: 12/30/16  0636     History   Chief Complaint Chief Complaint  Patient presents with  . Shortness of Breath    HPI [name] is a 83 y.o. male.  The history is provided by the patient. No language interpreter was used.  Shortness of Breath  This is a new problem. The average episode lasts 3 hours. The problem occurs intermittently.The problem has been gradually improving. Pertinent negatives include no fever. It is unknown what precipitated the problem. He has tried nothing for the symptoms. The treatment provided no relief. Associated medical issues include CAD. Associated medical issues do not include asthma.  Pt and wife report he became very short of breath this am.  Pt's wife reports pt did not want to come but agreed when he could not catch his breath. Pt denies any current chest pain.  He is not as short of breath as earlier.  Pt has end stage renal failure.  Pt had dialysis yesterday.  Pt reports he does not feel like he has increased fluid.    Past Medical History:  Diagnosis Date  . Atrial fibrillation ([place]) Aug. 2015   a. Dx 01/2014, s/p DCCV 03/06/14.  Marland Kitchen CAD (coronary artery disease)    a. prior CABG in 1997 with redo in 2000. b. last cath in 2008 - managed medically  . Carotid disease, bilateral ([place])    with multiple bilateral carotid surgeries  . Chronic diastolic CHF (congestive heart failure) ([place])   . Chronic respiratory failure with hypoxia ([place])   . CKD (chronic kidney disease), stage IV ([place])    a. Has fistula in place.   . Degenerative joint disease   . Dementia   . Generalized weakness    without overt findings  . GERD (gastroesophageal reflux disease)   . Gout   . Hyperlipidemia   . Hypertension   . Hypothyroidism   . Meniere's disease   . On home oxygen therapy    "2L" qhs (09/09/2015  . Orthopnea    Two-pillow  . Peripheral vascular disease ([place])   . Psoriasis   .  Shingles   . Sinus bradycardia    a. Toprol D/C'd due to this.   . Type 2 diabetes mellitus [facility])     Patient Active Problem List   Diagnosis Date Noted  . Hypotension 02/10/2016  . Dehydration 02/10/2016  . Pressure ulcer 01/11/2016  . Chest pain 01/10/2016  . ESRD on dialysis ([place])   . Palliative care encounter   . Goals of care, counseling/discussion   . Acute on chronic diastolic heart failure ([place]) 09/09/2015  . Decreased pedal pulses 06/03/2015  . Fall 03/24/2014  . Aftercare following surgery of the circulatory system, [place] 03/18/2014  . Chronic combined systolic and diastolic CHF (congestive heart failure) ([place]) 03/18/2014  . Hypertensive heart disease with heart failure ([place]) 03/18/2014  . CKD (chronic kidney disease), stage IV ([place]) 03/18/2014  . Hypothyroidism 03/18/2014  . Encounter for therapeutic drug monitoring 02/19/2014  . Atrial fibrillation ([place]) 02/13/2014  . Peripheral vascular disease, unspecified ([place]) 11/12/2013  . Occlusion and stenosis of carotid artery without mention of cerebral infarction 03/06/2012  . DOE (dyspnea on exertion) 02/13/2012  . Hyperlipidemia 08/30/2011  . CAD (coronary artery disease) 06/19/2007  . Diabetes mellitus with peripheral vascular disease ([place]) 06/17/2007    Past Surgical History:  Procedure Laterality Date  . APPENDECTOMY    . AV FISTULA  PLACEMENT Left 01/21/2013   Procedure: ARTERIOVENOUS (AV) FISTULA CREATION, Left Brachiocephalic;  Surgeon: Angelia Mould, MD;  Location: [facility];  Service: Vascular;  Laterality: Left;  . BLEPHAROPLASTY Bilateral   . CARDIAC CATHETERIZATION    . CARDIAC CATHETERIZATION  2008   L main irreg, LAD 80%, IMA-LAD & SVG-Diag patent, CFX 100%, SVG-OM 90%, RCA 70%, SVG-RCA OK, EF nl, med rx, no vessels appropriate for PCI  . CARDIAC CATHETERIZATION N/A 01/12/2016   Procedure: Left Heart Cath and Cors/Grafts Angiography;  Surgeon: Belva Crome, MD;  Location: [facility];  Service:  Cardiovascular;  Laterality: N/A;  . CARDIOVERSION N/A 03/06/2014   Procedure: CARDIOVERSION;  Surgeon: Larey Dresser, MD;  Location: [facility];  Service: Cardiovascular;  Laterality: N/A;  . CAROTID ENDARTERECTOMY Bilateral "several times"   "5 on right; 2-3 on left" (09/09/2015)  . CORONARY ARTERY BYPASS GRAFT  1997;  2002  . LUMBAR LAMINECTOMY  July 2001  . LUNG REMOVAL, PARTIAL    . SHOULDER SURGERY Bilateral   . TEE WITHOUT CARDIOVERSION N/A 03/06/2014   Procedure: TRANSESOPHAGEAL ECHOCARDIOGRAM (TEE);  Surgeon: Larey Dresser, MD;  Location: [facility];  Service: Cardiovascular;  Laterality: N/A;  . THORACOTOMY Left    due to fungal infection       Home Medications    Prior to Admission medications   Medication Sig Start Date End Date Taking? Authorizing Provider  acetaminophen (TYLENOL) 325 MG tablet Take 2 tablets (650 mg total) by mouth every 4 (four) hours as needed for headache or mild pain. 09/29/15   Clegg, Amy D, NP  atorvastatin (LIPITOR) 80 MG tablet TAKE ONE TABLET BY MOUTH ONCE DAILY 02/26/15   Nahser, Wonda Cheng, MD  cholecalciferol (VITAMIN D) 1000 UNITS tablet Take 1,000 Units by mouth daily at 6 PM. D3    [provider]  diphenhydrAMINE (BENADRYL) 25 mg capsule Take 50 mg by mouth See admin instructions. To be taken as a one-time dose for procedure on 01/13/16    [provider]  insulin aspart (NOVOLOG FLEXPEN) 100 UNIT/ML FlexPen Inject 8 Units into the skin 2 (two) times daily before a meal.    [provider]  Insulin Glargine (LANTUS SOLOSTAR) 100 UNIT/ML SOPN Inject 24 Units into the skin every morning.     [provider]  levothyroxine (SYNTHROID, LEVOTHROID) 175 MCG tablet Take 175 mcg by mouth daily before breakfast.    [provider]  memantine (NAMENDA) 5 MG tablet Take 5 mg by mouth daily.  03/12/15   [provider]  Multiple Vitamins-Minerals (DECUBI-VITE) CAPS Take 1 capsule by mouth daily.     [provider]  nitroGLYCERIN (NITROSTAT) 0.4 MG SL tablet Place 1 tablet (0.4 mg total) under the tongue every 5 (five) minutes as needed for chest pain. 01/12/16   Rai, Vernelle Emerald, MD  ranitidine (ZANTAC) 300 MG tablet Take 300 mg by mouth daily. 12/04/15   [provider]  sevelamer carbonate (RENVELA) 2.4 g PACK Take 2.4 g by mouth 3 (three) times daily. 09/29/15   Darrick Grinder D, NP  warfarin (COUMADIN) 5 MG tablet USE AS DIRECTED 12/15/16   Nahser, Wonda Cheng, MD    Family History Family History  Problem Relation Age of Onset  . Heart attack Father   . Heart disease Father        After 81 yrs of age  . Hyperlipidemia Father   . Hypertension Father   . Diabetes Mother   .  Hypertension Mother   . Heart disease Mother   . Heart disease Brother   . Diabetes Brother     Social History Social History  Substance Use Topics  . Smoking status: Never Smoker  . Smokeless tobacco: Never Used  . Alcohol use No     Allergies   Betadine [povidone iodine]; Iodinated diagnostic agents; Latex; and Tape   Review of Systems Review of Systems  Constitutional: Negative for fever.  Respiratory: Positive for shortness of breath.   All other systems reviewed and are negative.    Physical Exam Updated Vital Signs BP (!) 162/87   Pulse (!) 56   Temp 98.2 F (36.8 C) (Oral)   Resp 12   SpO2 97%   Physical Exam  Constitutional: He appears well-developed and well-nourished.  HENT:  Head: Normocephalic.  Eyes: Pupils are equal, round, and reactive to light.  Neck: Normal range of motion.  Cardiovascular: Normal rate and regular rhythm.   Pulmonary/Chest: Effort normal.  Harsh breath sounds, grumbling with expiration, rhonchi   Abdominal: Soft.  Musculoskeletal: Normal range of motion.  Neurological: He is alert.  Skin: Skin is warm.  Psychiatric: He has a normal mood and affect.  Nursing note and vitals reviewed.    ED Treatments / Results  Labs (all labs  ordered are listed, but only abnormal results are displayed) Labs Reviewed  CBC WITH DIFFERENTIAL/PLATELET - Abnormal; Notable for the following:       Result Value   RBC 3.67 (*)    Hemoglobin 11.6 (*)    HCT 35.9 (*)    Platelets 121 (*)    All other components within normal limits  COMPREHENSIVE METABOLIC PANEL - Abnormal; Notable for the following:    Chloride 100 (*)    Glucose, Bld 130 (*)    BUN 42 (*)    Creatinine, Ser 4.58 (*)    Calcium 8.7 (*)    Total Protein 6.3 (*)    GFR calc non Af Amer 11 (*)    GFR calc Af Amer 13 (*)    All other components within normal limits    EKG  EKG Interpretation  Date/Time:  Saturday December 30 2016 06:44:13 EDT Ventricular Rate:  58 PR Interval:    QRS Duration: 118 QT Interval:  469 QTC Calculation: 461 R Axis:   58 Text Interpretation:  Sinus rhythm Borderline short PR interval Incomplete right bundle branch block Anterior infarct, old No significant change since last tracing Confirmed by Ripley Fraise [phone]) on 12/30/2016 6:52:32 AM       Radiology Dg Chest 2 View  Result Date: 12/30/2016 CLINICAL DATA:  Shortness of breath EXAM: CHEST  2 VIEW COMPARISON:  04/05/2016 FINDINGS: Low lung volumes with interstitial crowding. No Kerley lines or air bronchogram. No effusion or pneumothorax. Normal heart size and stable aortic tortuosity. Status post CABG. Axillary region stent on the left. IMPRESSION: Low volume chest without acute superimposed finding. Electronically Signed   By: Monte Fantasia M.D.   On: 12/30/2016 07:44    Procedures Procedures (including critical care time)  Medications Ordered in ED Medications  ipratropium-albuterol (DUONEB) 0.5-2.5 (3) MG/3ML nebulizer solution 3 mL (not administered)     Initial Impression / Assessment and Plan / ED Course  I have reviewed the triage vital signs and the nursing   notes.  Pertinent labs & imaging results that were available during my care of the patient were  reviewed by me and considered in my medical decision making (see  chart for details).  Clinical Course as of Dec 31 1203  Sat Dec 30, 2016  0902 EKG 12-Lead [LS]    Clinical Course User Index [LS] Fransico Meadow, PA-C   Pt given duoneb.  Pt reports minimal relief  Pt has elevated troponin 0.06.     Final Clinical Impressions(s) / ED Diagnoses   Final diagnoses:  SOB (shortness of breath)   Consult hospitalist for admission for serial troponins.   New Prescriptions New Prescriptions   No medications on file     Sidney Ace 12/30/16 1209    Dorie Rank, MD 12/31/16 [phone]

## 2016-12-30 NOTE — ED Notes (Signed)
Lunch ordered 

## 2017-01-01 DIAGNOSIS — N2581 Secondary hyperparathyroidism of renal origin: Secondary | ICD-10-CM | POA: Diagnosis not present

## 2017-01-01 DIAGNOSIS — D509 Iron deficiency anemia, unspecified: Secondary | ICD-10-CM | POA: Diagnosis not present

## 2017-01-01 DIAGNOSIS — E1129 Type 2 diabetes mellitus with other diabetic kidney complication: Secondary | ICD-10-CM | POA: Diagnosis not present

## 2017-01-01 DIAGNOSIS — N186 End stage renal disease: Secondary | ICD-10-CM | POA: Diagnosis not present

## 2017-01-02 DIAGNOSIS — N186 End stage renal disease: Secondary | ICD-10-CM | POA: Diagnosis not present

## 2017-01-02 DIAGNOSIS — Z992 Dependence on renal dialysis: Secondary | ICD-10-CM | POA: Diagnosis not present

## 2017-01-02 DIAGNOSIS — T82858A Stenosis of vascular prosthetic devices, implants and grafts, initial encounter: Secondary | ICD-10-CM | POA: Diagnosis not present

## 2017-01-02 DIAGNOSIS — I871 Compression of vein: Secondary | ICD-10-CM | POA: Diagnosis not present

## 2017-01-03 DIAGNOSIS — N2581 Secondary hyperparathyroidism of renal origin: Secondary | ICD-10-CM | POA: Diagnosis not present

## 2017-01-03 DIAGNOSIS — D509 Iron deficiency anemia, unspecified: Secondary | ICD-10-CM | POA: Diagnosis not present

## 2017-01-03 DIAGNOSIS — E1122 Type 2 diabetes mellitus with diabetic chronic kidney disease: Secondary | ICD-10-CM | POA: Diagnosis not present

## 2017-01-03 DIAGNOSIS — N186 End stage renal disease: Secondary | ICD-10-CM | POA: Diagnosis not present

## 2017-01-03 DIAGNOSIS — E1129 Type 2 diabetes mellitus with other diabetic kidney complication: Secondary | ICD-10-CM | POA: Diagnosis not present

## 2017-01-05 DIAGNOSIS — N2581 Secondary hyperparathyroidism of renal origin: Secondary | ICD-10-CM | POA: Diagnosis not present

## 2017-01-05 DIAGNOSIS — E1129 Type 2 diabetes mellitus with other diabetic kidney complication: Secondary | ICD-10-CM | POA: Diagnosis not present

## 2017-01-05 DIAGNOSIS — D509 Iron deficiency anemia, unspecified: Secondary | ICD-10-CM | POA: Diagnosis not present

## 2017-01-05 DIAGNOSIS — N186 End stage renal disease: Secondary | ICD-10-CM | POA: Diagnosis not present

## 2017-01-08 DIAGNOSIS — D509 Iron deficiency anemia, unspecified: Secondary | ICD-10-CM | POA: Diagnosis not present

## 2017-01-08 DIAGNOSIS — N2581 Secondary hyperparathyroidism of renal origin: Secondary | ICD-10-CM | POA: Diagnosis not present

## 2017-01-08 DIAGNOSIS — E1129 Type 2 diabetes mellitus with other diabetic kidney complication: Secondary | ICD-10-CM | POA: Diagnosis not present

## 2017-01-08 DIAGNOSIS — N186 End stage renal disease: Secondary | ICD-10-CM | POA: Diagnosis not present

## 2017-01-10 DIAGNOSIS — N186 End stage renal disease: Secondary | ICD-10-CM | POA: Diagnosis not present

## 2017-01-10 DIAGNOSIS — N2581 Secondary hyperparathyroidism of renal origin: Secondary | ICD-10-CM | POA: Diagnosis not present

## 2017-01-10 DIAGNOSIS — E1129 Type 2 diabetes mellitus with other diabetic kidney complication: Secondary | ICD-10-CM | POA: Diagnosis not present

## 2017-01-10 DIAGNOSIS — D509 Iron deficiency anemia, unspecified: Secondary | ICD-10-CM | POA: Diagnosis not present

## 2017-01-11 ENCOUNTER — Ambulatory Visit (INDEPENDENT_AMBULATORY_CARE_PROVIDER_SITE_OTHER): Payer: Medicare Other | Admitting: *Deleted

## 2017-01-11 DIAGNOSIS — Z5181 Encounter for therapeutic drug level monitoring: Secondary | ICD-10-CM | POA: Diagnosis not present

## 2017-01-11 DIAGNOSIS — I480 Paroxysmal atrial fibrillation: Secondary | ICD-10-CM | POA: Diagnosis not present

## 2017-01-11 DIAGNOSIS — I779 Disorder of arteries and arterioles, unspecified: Secondary | ICD-10-CM | POA: Diagnosis not present

## 2017-01-11 LAB — POCT INR: INR: 2

## 2017-01-12 DIAGNOSIS — N2581 Secondary hyperparathyroidism of renal origin: Secondary | ICD-10-CM | POA: Diagnosis not present

## 2017-01-12 DIAGNOSIS — D509 Iron deficiency anemia, unspecified: Secondary | ICD-10-CM | POA: Diagnosis not present

## 2017-01-12 DIAGNOSIS — N186 End stage renal disease: Secondary | ICD-10-CM | POA: Diagnosis not present

## 2017-01-12 DIAGNOSIS — E1129 Type 2 diabetes mellitus with other diabetic kidney complication: Secondary | ICD-10-CM | POA: Diagnosis not present

## 2017-01-17 DIAGNOSIS — N2581 Secondary hyperparathyroidism of renal origin: Secondary | ICD-10-CM | POA: Diagnosis not present

## 2017-01-17 DIAGNOSIS — N186 End stage renal disease: Secondary | ICD-10-CM | POA: Diagnosis not present

## 2017-01-17 DIAGNOSIS — E1129 Type 2 diabetes mellitus with other diabetic kidney complication: Secondary | ICD-10-CM | POA: Diagnosis not present

## 2017-01-17 DIAGNOSIS — D509 Iron deficiency anemia, unspecified: Secondary | ICD-10-CM | POA: Diagnosis not present

## 2017-01-18 DIAGNOSIS — I10 Essential (primary) hypertension: Secondary | ICD-10-CM | POA: Diagnosis not present

## 2017-01-18 DIAGNOSIS — E1151 Type 2 diabetes mellitus with diabetic peripheral angiopathy without gangrene: Secondary | ICD-10-CM | POA: Diagnosis not present

## 2017-01-18 DIAGNOSIS — N185 Chronic kidney disease, stage 5: Secondary | ICD-10-CM | POA: Diagnosis not present

## 2017-01-18 DIAGNOSIS — Z6826 Body mass index (BMI) 26.0-26.9, adult: Secondary | ICD-10-CM | POA: Diagnosis not present

## 2017-01-19 DIAGNOSIS — N186 End stage renal disease: Secondary | ICD-10-CM | POA: Diagnosis not present

## 2017-01-19 DIAGNOSIS — E1129 Type 2 diabetes mellitus with other diabetic kidney complication: Secondary | ICD-10-CM | POA: Diagnosis not present

## 2017-01-19 DIAGNOSIS — N2581 Secondary hyperparathyroidism of renal origin: Secondary | ICD-10-CM | POA: Diagnosis not present

## 2017-01-19 DIAGNOSIS — D509 Iron deficiency anemia, unspecified: Secondary | ICD-10-CM | POA: Diagnosis not present

## 2017-01-22 ENCOUNTER — Encounter (HOSPITAL_COMMUNITY): Payer: Self-pay | Admitting: Emergency Medicine

## 2017-01-22 ENCOUNTER — Inpatient Hospital Stay (HOSPITAL_COMMUNITY)
Admission: EM | Admit: 2017-01-22 | Discharge: 2017-01-25 | DRG: 640 | Disposition: A | Discharge status: Home / Self Care | Payer: Medicare Other | Attending: Nephrology | Admitting: Nephrology

## 2017-01-22 ENCOUNTER — Emergency Department (HOSPITAL_COMMUNITY): Payer: Medicare Other

## 2017-01-22 DIAGNOSIS — I2510 Atherosclerotic heart disease of native coronary artery without angina pectoris: Secondary | ICD-10-CM | POA: Diagnosis present

## 2017-01-22 DIAGNOSIS — Z8249 Family history of ischemic heart disease and other diseases of the circulatory system: Secondary | ICD-10-CM

## 2017-01-22 DIAGNOSIS — J9621 Acute and chronic respiratory failure with hypoxia: Secondary | ICD-10-CM | POA: Diagnosis not present

## 2017-01-22 DIAGNOSIS — Z79899 Other long term (current) drug therapy: Secondary | ICD-10-CM

## 2017-01-22 DIAGNOSIS — I5020 Unspecified systolic (congestive) heart failure: Secondary | ICD-10-CM

## 2017-01-22 DIAGNOSIS — Z7901 Long term (current) use of anticoagulants: Secondary | ICD-10-CM

## 2017-01-22 DIAGNOSIS — E8770 Fluid overload, unspecified: Secondary | ICD-10-CM | POA: Diagnosis present

## 2017-01-22 DIAGNOSIS — N2581 Secondary hyperparathyroidism of renal origin: Secondary | ICD-10-CM | POA: Diagnosis present

## 2017-01-22 DIAGNOSIS — D631 Anemia in chronic kidney disease: Secondary | ICD-10-CM | POA: Diagnosis present

## 2017-01-22 DIAGNOSIS — I160 Hypertensive urgency: Secondary | ICD-10-CM | POA: Diagnosis present

## 2017-01-22 DIAGNOSIS — N186 End stage renal disease: Secondary | ICD-10-CM | POA: Diagnosis not present

## 2017-01-22 DIAGNOSIS — M898X9 Other specified disorders of bone, unspecified site: Secondary | ICD-10-CM | POA: Diagnosis present

## 2017-01-22 DIAGNOSIS — F0390 Unspecified dementia without behavioral disturbance: Secondary | ICD-10-CM | POA: Diagnosis present

## 2017-01-22 DIAGNOSIS — Z902 Acquired absence of lung [part of]: Secondary | ICD-10-CM

## 2017-01-22 DIAGNOSIS — I132 Hypertensive heart and chronic kidney disease with heart failure and with stage 5 chronic kidney disease, or end stage renal disease: Secondary | ICD-10-CM | POA: Diagnosis not present

## 2017-01-22 DIAGNOSIS — F17210 Nicotine dependence, cigarettes, uncomplicated: Secondary | ICD-10-CM | POA: Diagnosis present

## 2017-01-22 DIAGNOSIS — I5042 Chronic combined systolic (congestive) and diastolic (congestive) heart failure: Secondary | ICD-10-CM | POA: Diagnosis present

## 2017-01-22 DIAGNOSIS — I4891 Unspecified atrial fibrillation: Secondary | ICD-10-CM | POA: Diagnosis present

## 2017-01-22 DIAGNOSIS — Z8349 Family history of other endocrine, nutritional and metabolic diseases: Secondary | ICD-10-CM

## 2017-01-22 DIAGNOSIS — Z992 Dependence on renal dialysis: Secondary | ICD-10-CM

## 2017-01-22 DIAGNOSIS — E039 Hypothyroidism, unspecified: Secondary | ICD-10-CM | POA: Diagnosis present

## 2017-01-22 DIAGNOSIS — E1151 Type 2 diabetes mellitus with diabetic peripheral angiopathy without gangrene: Secondary | ICD-10-CM | POA: Diagnosis present

## 2017-01-22 DIAGNOSIS — Z951 Presence of aortocoronary bypass graft: Secondary | ICD-10-CM

## 2017-01-22 DIAGNOSIS — Z9115 Patient's noncompliance with renal dialysis: Secondary | ICD-10-CM

## 2017-01-22 DIAGNOSIS — E8779 Other fluid overload: Principal | ICD-10-CM | POA: Diagnosis present

## 2017-01-22 DIAGNOSIS — Z9981 Dependence on supplemental oxygen: Secondary | ICD-10-CM

## 2017-01-22 DIAGNOSIS — Z91040 Latex allergy status: Secondary | ICD-10-CM

## 2017-01-22 DIAGNOSIS — E785 Hyperlipidemia, unspecified: Secondary | ICD-10-CM | POA: Diagnosis present

## 2017-01-22 DIAGNOSIS — Z91041 Radiographic dye allergy status: Secondary | ICD-10-CM

## 2017-01-22 DIAGNOSIS — R079 Chest pain, unspecified: Secondary | ICD-10-CM | POA: Diagnosis present

## 2017-01-22 DIAGNOSIS — Z833 Family history of diabetes mellitus: Secondary | ICD-10-CM

## 2017-01-22 DIAGNOSIS — E1122 Type 2 diabetes mellitus with diabetic chronic kidney disease: Secondary | ICD-10-CM | POA: Diagnosis present

## 2017-01-22 DIAGNOSIS — I482 Chronic atrial fibrillation: Secondary | ICD-10-CM | POA: Diagnosis present

## 2017-01-22 DIAGNOSIS — Z66 Do not resuscitate: Secondary | ICD-10-CM | POA: Diagnosis present

## 2017-01-22 DIAGNOSIS — I5021 Acute systolic (congestive) heart failure: Secondary | ICD-10-CM | POA: Diagnosis not present

## 2017-01-22 DIAGNOSIS — Z794 Long term (current) use of insulin: Secondary | ICD-10-CM

## 2017-01-22 HISTORY — DX: Cardiac murmur, unspecified: R01.1

## 2017-01-22 LAB — CBG MONITORING, ED: Glucose-Capillary: 215 mg/dL — ABNORMAL HIGH (ref 65–99)

## 2017-01-22 NOTE — ED Provider Notes (Signed)
[facility] DEPT Provider Note   CSN: [id number] Arrival date & time: 01/22/17  2333  By signing my name below, I, Ny'Kea Lewis, attest that this documentation has been prepared under the direction and in the presence of [place], Felicite Zeimet, MD. Electronically Signed: Lise Auer, ED Scribe. 01/23/17. 12:23 AM.  History   Chief Complaint Chief Complaint  Patient presents with  . Chest Pain   The history is provided by the patient, the spouse and the EMS personnel. No language interpreter was used.  Chest Pain   This is a new problem. The current episode started 3 to 5 hours ago. The problem occurs constantly. The problem has not changed since onset.The pain is associated with rest. The pain is present in the lateral region. The quality of the pain is described as sharp. The pain does not radiate. Associated symptoms include shortness of breath. Pertinent negatives include no abdominal pain, no diaphoresis, no nausea, no numbness and no weakness. The treatment provided significant relief. Risk factors include male gender.  Pertinent negatives for past medical history include no Marfan's syndrome.  Procedure history is negative for EPS study.   HPI HPI Comments: [name] is a 83 y.o. male with a history of atrial fibrillation, CAD, CHF, CKD, GERD, HTN, HLD, hypothyroidism, and DM II, brought in by ambulance, who presents to the Emergency Department complaining of sudden onset, sharp left sided non-radiating chest pain that began at 5:30 pm. He notes associated shortness of breath. Per EMS, pt was given 3 NTG and 4 baby Aspirins with relief of his symptoms. At the time of there assessment he was hypertensive at 250/110. He is dialyzed on MWF and states he did not attend dialysis today due to him not wanting to go. Pt's wife reportss his last dialysis treatment was on Friday, but she states he did not finish his treatment, due to pt not wanting to, she states this is very common for him. Pt  reports his chest pain has improved at this time, but it is still present. Denies abdominal pain, nausea, numbness, weakness, or diaphoresis.    Past Medical History:  Diagnosis Date  . Atrial fibrillation ([place]) Aug. 2015   a. Dx 01/2014, s/p DCCV 03/06/14.  Marland Kitchen CAD (coronary artery disease)    a. prior CABG in 1997 with redo in 2000. b. last cath in 2008 - managed medically  . Carotid disease, bilateral ([place])    with multiple bilateral carotid surgeries  . Chronic diastolic CHF (congestive heart failure) ([place])   . Chronic respiratory failure with hypoxia ([place])   . CKD (chronic kidney disease), stage IV ([place])    a. Has fistula in place.   . Degenerative joint disease   . Dementia   . Generalized weakness    without overt findings  . GERD (gastroesophageal reflux disease)   . Gout   . Hyperlipidemia   . Hypertension   . Hypothyroidism   . Meniere's disease   . On home oxygen therapy    "2L" qhs (09/09/2015  . Orthopnea    Two-pillow  . Peripheral vascular disease ([place])   . Psoriasis   . Shingles   . Sinus bradycardia    a. Toprol D/C'd due to this.   . Type 2 diabetes mellitus [facility]     Patient Active Problem List   Diagnosis Date Noted  . Hypotension 02/10/2016  . Dehydration 02/10/2016  . Pressure ulcer 01/11/2016  . Chest pain 01/10/2016  . ESRD on dialysis ([place])   .  Palliative care encounter   . Goals of care, counseling/discussion   . Acute on chronic diastolic heart failure ([place]) 09/09/2015  . Decreased pedal pulses 06/03/2015  . Fall 03/24/2014  . Aftercare following surgery of the circulatory system, [place] 03/18/2014  . Chronic combined systolic and diastolic CHF (congestive heart failure) ([place]) 03/18/2014  . Hypertensive heart disease with heart failure ([place]) 03/18/2014  . CKD (chronic kidney disease), stage IV ([place]) 03/18/2014  . Hypothyroidism 03/18/2014  . Encounter for therapeutic drug monitoring 02/19/2014  . Atrial fibrillation ([place]) 02/13/2014  .  Peripheral vascular disease, unspecified ([place]) 11/12/2013  . Occlusion and stenosis of carotid artery without mention of cerebral infarction 03/06/2012  . DOE (dyspnea on exertion) 02/13/2012  . Hyperlipidemia 08/30/2011  . CAD (coronary artery disease) 06/19/2007  . Diabetes mellitus with peripheral vascular disease ([place]) 06/17/2007    Past Surgical History:  Procedure Laterality Date  . APPENDECTOMY    . AV FISTULA PLACEMENT Left 01/21/2013   Procedure: ARTERIOVENOUS (AV) FISTULA CREATION, Left Brachiocephalic;  Surgeon: Angelia Mould, MD;  Location: [facility];  Service: Vascular;  Laterality: Left;  . BLEPHAROPLASTY Bilateral   . CARDIAC CATHETERIZATION    . CARDIAC CATHETERIZATION  2008   L main irreg, LAD 80%, IMA-LAD & SVG-Diag patent, CFX 100%, SVG-OM 90%, RCA 70%, SVG-RCA OK, EF nl, med rx, no vessels appropriate for PCI  . CARDIAC CATHETERIZATION N/A 01/12/2016   Procedure: Left Heart Cath and Cors/Grafts Angiography;  Surgeon: Belva Crome, MD;  Location: [facility];  Service: Cardiovascular;  Laterality: N/A;  . CARDIOVERSION N/A 03/06/2014   Procedure: CARDIOVERSION;  Surgeon: Larey Dresser, MD;  Location: [facility];  Service: Cardiovascular;  Laterality: N/A;  . CAROTID ENDARTERECTOMY Bilateral "several times"   "5 on right; 2-3 on left" (09/09/2015)  . CORONARY ARTERY BYPASS GRAFT  1997;  2002  . LUMBAR LAMINECTOMY  July 2001  . LUNG REMOVAL, PARTIAL    . SHOULDER SURGERY Bilateral   . TEE WITHOUT CARDIOVERSION N/A 03/06/2014   Procedure: TRANSESOPHAGEAL ECHOCARDIOGRAM (TEE);  Surgeon: Larey Dresser, MD;  Location: [facility];  Service: Cardiovascular;  Laterality: N/A;  . THORACOTOMY Left    due to fungal infection    Home Medications    Prior to Admission medications   Medication Sig Start Date End Date Taking? Authorizing Provider  acetaminophen (TYLENOL) 325 MG tablet Take 2 tablets (650 mg total) by mouth every 4 (four) hours as needed for  headache or mild pain. 09/29/15   Clegg, Amy D, NP  albuterol (PROVENTIL HFA;VENTOLIN HFA) 108 (90 Base) MCG/ACT inhaler Inhale 2 puffs into the lungs every 4 (four) hours as needed for wheezing or shortness of breath. 12/30/16   Fransico Meadow, PA-C  atorvastatin (LIPITOR) 80 MG tablet TAKE ONE TABLET BY MOUTH ONCE DAILY 02/26/15   Nahser, Wonda Cheng, MD  calcium acetate (PHOSLO) 667 MG capsule Take 1,334 mg by mouth 3 (three) times daily with meals.    [provider]  cholecalciferol (VITAMIN D) 1000 UNITS tablet Take 1,000 Units by mouth daily at 6 PM. D3    [provider]  divalproex (DEPAKOTE SPRINKLE) 125 MG capsule Take 125-250 mg by mouth daily as needed (mood).  11/30/16   [provider]  insulin aspart (NOVOLOG FLEXPEN) 100 UNIT/ML FlexPen Inject 8 Units into the skin 2 (two) times daily before a meal.    [provider]  Insulin Glargine (LANTUS SOLOSTAR) 100 UNIT/ML SOPN Inject 24 Units into  the skin every morning.     [provider]  levothyroxine (SYNTHROID, LEVOTHROID) 175 MCG tablet Take 175 mcg by mouth daily before breakfast.    [provider]  memantine (NAMENDA) 5 MG tablet Take 5 mg by mouth daily.  03/12/15   [provider]  Multiple Vitamins-Minerals (DECUBI-VITE) CAPS Take 1 capsule by mouth daily.    [provider]  multivitamin (RENA-VIT) TABS tablet Take 1 tablet by mouth daily.    [provider]  nitroGLYCERIN (NITROSTAT) 0.4 MG SL tablet Place 1 tablet (0.4 mg total) under the tongue every 5 (five) minutes as needed for chest pain. 01/12/16   Rai, Vernelle Emerald, MD  polyethylene glycol (MIRALAX / GLYCOLAX) packet Take 17 g by mouth daily as needed for mild constipation.    [provider]  ranitidine (ZANTAC) 300 MG tablet Take 300 mg by mouth daily. 12/04/15   [provider]  sevelamer carbonate (RENVELA) 2.4 g PACK Take 2.4 g by mouth 3 (three) times daily. Patient not taking:  Reported on 12/30/2016 09/29/15   Darrick Grinder D, NP  warfarin (COUMADIN) 5 MG tablet USE AS DIRECTED Patient not taking: Reported on 12/30/2016 12/15/16   Nahser, Wonda Cheng, MD  warfarin (COUMADIN) 5 MG tablet Take 5-7.5 mg by mouth See admin instructions. Take 1 1/2 (7.5mg ) tablet on M, W, F, and 1 tablet (5mg ) on Sun, T, Th, Sat    [provider]   Family History Family History  Problem Relation Age of Onset  . Heart attack Father   . Heart disease Father        After 92 yrs of age  . Hyperlipidemia Father   . Hypertension Father   . Diabetes Mother   . Hypertension Mother   . Heart disease Mother   . Heart disease Brother   . Diabetes Brother    Social History Social History  Substance Use Topics  . Smoking status: Never Smoker  . Smokeless tobacco: Never Used  . Alcohol use No   Allergies   Betadine [povidone iodine]; Iodinated diagnostic agents; Latex; and Tape  Review of Systems Review of Systems  Constitutional: Negative for diaphoresis.  Respiratory: Positive for shortness of breath.   Cardiovascular: Positive for chest pain.  Gastrointestinal: Negative for abdominal pain and nausea.  Neurological: Negative for weakness and numbness.  All other systems reviewed and are negative.  Physical Exam Updated Vital Signs Ht 5\' 10"  (1.778 m)   Wt 169 lb (76.7 kg)   BMI 24.25 kg/m   Physical Exam  Constitutional: He appears well-developed and well-nourished. No distress.  HENT:  Head: Normocephalic and atraumatic.  Mouth/Throat: Oropharynx is clear and moist. No oropharyngeal exudate.  Eyes: Pupils are equal, round, and reactive to light. Conjunctivae and EOM are normal. Right eye exhibits no discharge. Left eye exhibits no discharge. No scleral icterus.  Neck: Normal range of motion. Neck supple. No JVD present. No tracheal deviation present.  Trachea is midline. No stridor or carotid bruits.  Cardiovascular: Normal rate, regular rhythm and intact distal pulses.     Murmur heard. Systolic ejection murmur 3/6  Pulmonary/Chest: Effort normal and breath sounds normal. No stridor. No respiratory distress. He has no wheezes. He has no rales.  Lungs CTA bilaterally.  Abdominal: Soft. Bowel sounds are normal. He exhibits no distension. There is no tenderness. There is no rebound and no guarding.  Musculoskeletal: Normal range of motion. He exhibits no edema or tenderness.  All compartments are soft.  No palpable cords.   Lymphadenopathy:    He has no cervical adenopathy.  Neurological: He is alert. He has normal reflexes. He displays normal reflexes.  Skin: Skin is warm and dry. Capillary refill takes less than 2 seconds. Erythema:    Psychiatric: He has a normal mood and affect. His behavior is normal.  Nursing note and vitals reviewed.   ED Treatments / Results   Vitals:   01/23/17 0015 01/23/17 0025  BP: (!) 209/66 (!) 215/70  Pulse: 73 73  Resp: (!) 26 (!) 32  Temp:      DIAGNOSTIC STUDIES: Oxygen Saturation is 100% on RA, normal by my interpretation.   COORDINATION OF CARE: 12:08 AM-Discussed next steps with pt. Pt verbalized understanding and is agreeable with the plan.   Labs (all labs ordered are listed, but only abnormal results are displayed) Results for orders placed or performed during the hospital encounter of 01/22/17  I-stat chem 8, ed  Result Value Ref Range   Sodium 139 135 - 145 mmol/L   Potassium 4.6 3.5 - 5.1 mmol/L   Chloride 100 (L) 101 - 111 mmol/L   BUN 70 (H) 6 - 20 mg/dL   Creatinine, Ser 5.50 (H) 0.61 - 1.24 mg/dL   Glucose, Bld 224 (H) 65 - 99 mg/dL   Calcium, Ion 1.05 (L) 1.15 - 1.40 mmol/L   TCO2 28 0 - 100 mmol/L   Hemoglobin 10.5 (L) 13.0 - 17.0 g/dL   HCT 31.0 (L) 39.0 - 52.0 %  I-stat troponin, ED  Result Value Ref Range   Troponin i, poc 0.16 (HH) 0.00 - 0.08 ng/mL   Comment NOTIFIED PHYSICIAN    Comment 3          CBG monitoring, ED  Result Value Ref Range   Glucose-Capillary 215 (H) 65 - 99  mg/dL   Dg Chest 2 View  Result Date: 12/30/2016 CLINICAL DATA:  Shortness of breath EXAM: CHEST  2 VIEW COMPARISON:  04/05/2016 FINDINGS: Low lung volumes with interstitial crowding. No Kerley lines or air bronchogram. No effusion or pneumothorax. Normal heart size and stable aortic tortuosity. Status post CABG. Axillary region stent on the left. IMPRESSION: Low volume chest without acute superimposed finding. Electronically Signed   By: Monte Fantasia M.D.   On: 12/30/2016 07:44   Dg Chest Portable 1 View  Result Date: 01/23/2017 CLINICAL DATA:  Chest pain tonight EXAM: PORTABLE CHEST 1 VIEW COMPARISON:  12/30/2016 FINDINGS: Postoperative changes in the mediastinum. Shallow inspiration with linear atelectasis in the lung bases, mostly on the left. Heart size and pulmonary vascularity are normal. Developing interstitial changes in the lower lungs may indicate developing edema or interstitial pneumonitis. Mild blunting of costophrenic angles suggesting small effusions. No pneumothorax. Calcified and tortuous aorta. Vascular stent projected over the left axilla. Degenerative changes in the shoulders. IMPRESSION: Shallow inspiration with atelectasis in the lung bases. Developing interstitial pattern to the lungs may represent interstitial edema or pneumonitis. Blunting of the costophrenic angles may indicate tiny pleural effusions. Electronically Signed   By: Lucienne Capers M.D.   On: 01/23/2017 00:07    EKG  EKG Interpretation  Date/Time:  Monday January 22 2017 23:46:52 EDT Ventricular Rate:  78 PR Interval:    QRS Duration: 109 QT Interval:  397 QTC Calculation: 453 R Axis:   32 Text Interpretation:  Sinus rhythm LVH with secondary repolarization abnormality T wave inversion , with more pronounced inversions compared to prior. Abnormal ekg Confirmed by Carmin Muskrat [phone]) on  01/22/2017 11:59:49 PM        Procedures Procedures (including critical care time)    Final Clinical  Impressions(s) / ED Diagnoses  Pulmonary edema:  Admit to medicine hydralazine for BP management along with ntg  I personally performed the services described in this documentation, which was scribed in my presence. The recorded information has been reviewed and is accurate.   Randal Buba, Keighan Amezcua, MD 01/23/17 [id number]

## 2017-01-22 NOTE — ED Triage Notes (Signed)
HR Pt from home c.o left sided non radiating CP that started 2 hrs ago. Pt receives dialysis MWF but missed todays, unable to answer why. Pt given 3 Nitros with relief of CP and 4 baby aspirins, reports no pain at this time. Pt a/ox4. Pt hypertensive 250/110 down to 200/90 HR 73. 96% on Room air. EMS applied 2L [place]. NAD at this time

## 2017-01-23 ENCOUNTER — Encounter (HOSPITAL_COMMUNITY): Payer: Self-pay | Admitting: Emergency Medicine

## 2017-01-23 DIAGNOSIS — F0390 Unspecified dementia without behavioral disturbance: Secondary | ICD-10-CM | POA: Diagnosis present

## 2017-01-23 DIAGNOSIS — R072 Precordial pain: Secondary | ICD-10-CM | POA: Diagnosis not present

## 2017-01-23 DIAGNOSIS — I5020 Unspecified systolic (congestive) heart failure: Secondary | ICD-10-CM | POA: Diagnosis not present

## 2017-01-23 DIAGNOSIS — I160 Hypertensive urgency: Secondary | ICD-10-CM | POA: Diagnosis present

## 2017-01-23 DIAGNOSIS — Z91041 Radiographic dye allergy status: Secondary | ICD-10-CM | POA: Diagnosis not present

## 2017-01-23 DIAGNOSIS — Z902 Acquired absence of lung [part of]: Secondary | ICD-10-CM | POA: Diagnosis not present

## 2017-01-23 DIAGNOSIS — E1151 Type 2 diabetes mellitus with diabetic peripheral angiopathy without gangrene: Secondary | ICD-10-CM | POA: Diagnosis not present

## 2017-01-23 DIAGNOSIS — J9621 Acute and chronic respiratory failure with hypoxia: Secondary | ICD-10-CM | POA: Diagnosis present

## 2017-01-23 DIAGNOSIS — Z9115 Patient's noncompliance with renal dialysis: Secondary | ICD-10-CM | POA: Diagnosis not present

## 2017-01-23 DIAGNOSIS — J810 Acute pulmonary edema: Secondary | ICD-10-CM | POA: Diagnosis not present

## 2017-01-23 DIAGNOSIS — E785 Hyperlipidemia, unspecified: Secondary | ICD-10-CM | POA: Diagnosis present

## 2017-01-23 DIAGNOSIS — N186 End stage renal disease: Secondary | ICD-10-CM | POA: Diagnosis not present

## 2017-01-23 DIAGNOSIS — E8770 Fluid overload, unspecified: Secondary | ICD-10-CM | POA: Diagnosis not present

## 2017-01-23 DIAGNOSIS — Z992 Dependence on renal dialysis: Secondary | ICD-10-CM | POA: Diagnosis not present

## 2017-01-23 DIAGNOSIS — I480 Paroxysmal atrial fibrillation: Secondary | ICD-10-CM

## 2017-01-23 DIAGNOSIS — R079 Chest pain, unspecified: Secondary | ICD-10-CM | POA: Diagnosis present

## 2017-01-23 DIAGNOSIS — Z951 Presence of aortocoronary bypass graft: Secondary | ICD-10-CM | POA: Diagnosis not present

## 2017-01-23 DIAGNOSIS — I482 Chronic atrial fibrillation: Secondary | ICD-10-CM | POA: Diagnosis not present

## 2017-01-23 DIAGNOSIS — I132 Hypertensive heart and chronic kidney disease with heart failure and with stage 5 chronic kidney disease, or end stage renal disease: Secondary | ICD-10-CM | POA: Diagnosis present

## 2017-01-23 DIAGNOSIS — Z9981 Dependence on supplemental oxygen: Secondary | ICD-10-CM | POA: Diagnosis not present

## 2017-01-23 DIAGNOSIS — I2510 Atherosclerotic heart disease of native coronary artery without angina pectoris: Secondary | ICD-10-CM | POA: Diagnosis present

## 2017-01-23 DIAGNOSIS — D631 Anemia in chronic kidney disease: Secondary | ICD-10-CM | POA: Diagnosis present

## 2017-01-23 DIAGNOSIS — N2581 Secondary hyperparathyroidism of renal origin: Secondary | ICD-10-CM | POA: Diagnosis present

## 2017-01-23 DIAGNOSIS — Z66 Do not resuscitate: Secondary | ICD-10-CM | POA: Diagnosis present

## 2017-01-23 DIAGNOSIS — F17210 Nicotine dependence, cigarettes, uncomplicated: Secondary | ICD-10-CM | POA: Diagnosis present

## 2017-01-23 DIAGNOSIS — E1122 Type 2 diabetes mellitus with diabetic chronic kidney disease: Secondary | ICD-10-CM | POA: Diagnosis present

## 2017-01-23 DIAGNOSIS — E8779 Other fluid overload: Secondary | ICD-10-CM | POA: Diagnosis present

## 2017-01-23 DIAGNOSIS — I120 Hypertensive chronic kidney disease with stage 5 chronic kidney disease or end stage renal disease: Secondary | ICD-10-CM | POA: Diagnosis not present

## 2017-01-23 DIAGNOSIS — I5042 Chronic combined systolic (congestive) and diastolic (congestive) heart failure: Secondary | ICD-10-CM | POA: Diagnosis not present

## 2017-01-23 DIAGNOSIS — M898X9 Other specified disorders of bone, unspecified site: Secondary | ICD-10-CM | POA: Diagnosis present

## 2017-01-23 LAB — CBC WITH DIFFERENTIAL/PLATELET
BASOS ABS: 0 10*3/uL (ref 0.0–0.1)
BASOS PCT: 0 %
EOS ABS: 0.2 10*3/uL (ref 0.0–0.7)
EOS PCT: 3 %
HCT: 32.9 % — ABNORMAL LOW (ref 39.0–52.0)
Hemoglobin: 10.9 g/dL — ABNORMAL LOW (ref 13.0–17.0)
LYMPHS PCT: 16 %
Lymphs Abs: 1 10*3/uL (ref 0.7–4.0)
MCH: 32.1 pg (ref 26.0–34.0)
MCHC: 33.1 g/dL (ref 30.0–36.0)
MCV: 96.8 fL (ref 78.0–100.0)
MONO ABS: 0.2 10*3/uL (ref 0.1–1.0)
Monocytes Relative: 4 %
Neutro Abs: 4.6 10*3/uL (ref 1.7–7.7)
Neutrophils Relative %: 77 %
Platelets: 121 10*3/uL — ABNORMAL LOW (ref 150–400)
RBC: 3.4 MIL/uL — AB (ref 4.22–5.81)
RDW: 14.6 % (ref 11.5–15.5)
WBC: 6 10*3/uL (ref 4.0–10.5)

## 2017-01-23 LAB — GLUCOSE, CAPILLARY
GLUCOSE-CAPILLARY: 148 mg/dL — AB (ref 65–99)
GLUCOSE-CAPILLARY: 203 mg/dL — AB (ref 65–99)
Glucose-Capillary: 89 mg/dL (ref 65–99)
Glucose-Capillary: 236 mg/dL — ABNORMAL HIGH (ref 65–99)

## 2017-01-23 LAB — CBC
HEMATOCRIT: 32.5 % — AB (ref 39.0–52.0)
Hemoglobin: 10.8 g/dL — ABNORMAL LOW (ref 13.0–17.0)
MCH: 31.8 pg (ref 26.0–34.0)
MCHC: 33.2 g/dL (ref 30.0–36.0)
MCV: 95.6 fL (ref 78.0–100.0)
Platelets: 123 10*3/uL — ABNORMAL LOW (ref 150–400)
RBC: 3.4 MIL/uL — ABNORMAL LOW (ref 4.22–5.81)
RDW: 14.6 % (ref 11.5–15.5)
WBC: 6.6 10*3/uL (ref 4.0–10.5)

## 2017-01-23 LAB — PROTIME-INR
INR: 2.35
INR: 2.4
PROTHROMBIN TIME: 26.1 s — AB (ref 11.4–15.2)
Prothrombin Time: 26.6 seconds — ABNORMAL HIGH (ref 11.4–15.2)

## 2017-01-23 LAB — CREATININE, SERUM
Creatinine, Ser: 5.44 mg/dL — ABNORMAL HIGH (ref 0.61–1.24)
GFR calc Af Amer: 10 mL/min — ABNORMAL LOW (ref >60)
GFR, EST NON AFRICAN AMERICAN: 9 mL/min — AB (ref >60)

## 2017-01-23 LAB — I-STAT CHEM 8, ED
BUN: 70 mg/dL — AB (ref 6–20)
CREATININE: 5.5 mg/dL — AB (ref 0.61–1.24)
Calcium, Ion: 1.05 mmol/L — ABNORMAL LOW (ref 1.15–1.40)
Chloride: 100 mmol/L — ABNORMAL LOW (ref 101–111)
Glucose, Bld: 224 mg/dL — ABNORMAL HIGH (ref 65–99)
HEMATOCRIT: 31 % — AB (ref 39.0–52.0)
Hemoglobin: 10.5 g/dL — ABNORMAL LOW (ref 13.0–17.0)
Potassium: 4.6 mmol/L (ref 3.5–5.1)
Sodium: 139 mmol/L (ref 135–145)
TCO2: 28 mmol/L (ref 0–100)

## 2017-01-23 LAB — BASIC METABOLIC PANEL
Anion gap: 10 (ref 5–15)
BUN: 70 mg/dL — AB (ref 6–20)
CO2: 24 mmol/L (ref 22–32)
CREATININE: 5.21 mg/dL — AB (ref 0.61–1.24)
Calcium: 8.9 mg/dL (ref 8.9–10.3)
Chloride: 104 mmol/L (ref 101–111)
GFR calc Af Amer: 11 mL/min — ABNORMAL LOW (ref >60)
GFR, EST NON AFRICAN AMERICAN: 9 mL/min — AB (ref >60)
GLUCOSE: 174 mg/dL — AB (ref 65–99)
POTASSIUM: 4.6 mmol/L (ref 3.5–5.1)
Sodium: 138 mmol/L (ref 135–145)

## 2017-01-23 LAB — TROPONIN I
TROPONIN I: 0.25 ng/mL — AB (ref <0.03)
TROPONIN I: 0.32 ng/mL — AB (ref <0.03)
TROPONIN I: 0.22 ng/mL — AB (ref <0.03)

## 2017-01-23 LAB — I-STAT TROPONIN, ED: Troponin i, poc: 0.16 ng/mL (ref 0.00–0.08)

## 2017-01-23 LAB — HEPATITIS B SURFACE ANTIGEN: Hepatitis B Surface Ag: NEGATIVE

## 2017-01-23 MED ORDER — INSULIN GLARGINE 100 UNIT/ML ~~LOC~~ SOLN
24.0000 [IU] | Freq: Every morning | SUBCUTANEOUS | Status: DC
  Administered 2017-01-23 – 2017-01-25 (×3): 24 [IU] via SUBCUTANEOUS
  Filled 2017-01-23 (×3): qty 0.24

## 2017-01-23 MED ORDER — ONDANSETRON HCL 4 MG PO TABS: 4.0000 mg | ORAL_TABLET | Freq: Four times a day (QID) | ORAL | Status: DC | PRN

## 2017-01-23 MED ORDER — HEPARIN SODIUM (PORCINE) 5000 UNIT/ML IJ SOLN: 5000.0000 [IU] | Freq: Three times a day (TID) | INTRAMUSCULAR | Status: DC

## 2017-01-23 MED ORDER — WARFARIN - PHARMACIST DOSING INPATIENT: Freq: Every day | Status: DC

## 2017-01-23 MED ORDER — HYDRALAZINE HCL 20 MG/ML IJ SOLN: 10.0000 mg | INTRAMUSCULAR | Status: DC | PRN

## 2017-01-23 MED ORDER — RENA-VITE PO TABS
1.0000 | ORAL_TABLET | Freq: Every day | ORAL | Status: DC
  Administered 2017-01-23: 1 via ORAL
  Filled 2017-01-23 (×2): qty 1

## 2017-01-23 MED ORDER — HEPARIN SODIUM (PORCINE) 1000 UNIT/ML DIALYSIS: 1000.0000 [IU] | INTRAMUSCULAR | Status: DC | PRN

## 2017-01-23 MED ORDER — DIVALPROEX SODIUM 125 MG PO CSDR
125.0000 mg | DELAYED_RELEASE_CAPSULE | Freq: Every day | ORAL | Status: DC
  Administered 2017-01-23 – 2017-01-25 (×3): 125 mg via ORAL
  Filled 2017-01-23 (×3): qty 1

## 2017-01-23 MED ORDER — ATORVASTATIN CALCIUM 80 MG PO TABS
80.0000 mg | ORAL_TABLET | Freq: Every day | ORAL | Status: DC
  Administered 2017-01-23 – 2017-01-25 (×3): 80 mg via ORAL
  Filled 2017-01-23 (×3): qty 1

## 2017-01-23 MED ORDER — CALCIUM ACETATE (PHOS BINDER) 667 MG PO CAPS
1334.0000 mg | ORAL_CAPSULE | Freq: Three times a day (TID) | ORAL | Status: DC
  Administered 2017-01-23 – 2017-01-25 (×7): 1334 mg via ORAL
  Filled 2017-01-23 (×7): qty 2

## 2017-01-23 MED ORDER — LIDOCAINE-PRILOCAINE 2.5-2.5 % EX CREA: 1.0000 "application " | TOPICAL_CREAM | CUTANEOUS | Status: DC | PRN

## 2017-01-23 MED ORDER — INSULIN ASPART 100 UNIT/ML ~~LOC~~ SOLN
8.0000 [IU] | Freq: Two times a day (BID) | SUBCUTANEOUS | Status: DC
  Administered 2017-01-23 – 2017-01-25 (×3): 8 [IU] via SUBCUTANEOUS

## 2017-01-23 MED ORDER — MEMANTINE HCL 5 MG PO TABS
5.0000 mg | ORAL_TABLET | Freq: Every day | ORAL | Status: DC
  Administered 2017-01-23 – 2017-01-25 (×3): 5 mg via ORAL
  Filled 2017-01-23 (×3): qty 1

## 2017-01-23 MED ORDER — FAMOTIDINE 20 MG PO TABS
20.0000 mg | ORAL_TABLET | Freq: Two times a day (BID) | ORAL | Status: DC
  Administered 2017-01-23: 20 mg via ORAL
  Filled 2017-01-23: qty 1

## 2017-01-23 MED ORDER — SODIUM CHLORIDE 0.9 % IV SOLN: 100.0000 mL | INTRAVENOUS | Status: DC | PRN

## 2017-01-23 MED ORDER — LIDOCAINE HCL (PF) 1 % IJ SOLN: 5.0000 mL | INTRAMUSCULAR | Status: DC | PRN

## 2017-01-23 MED ORDER — NITROGLYCERIN 2 % TD OINT
1.0000 [in_us] | TOPICAL_OINTMENT | Freq: Once | TRANSDERMAL | Status: AC
  Administered 2017-01-23: 1 [in_us] via TOPICAL
  Filled 2017-01-23: qty 1

## 2017-01-23 MED ORDER — PENTAFLUOROPROP-TETRAFLUOROETH EX AERO: 1.0000 "application " | INHALATION_SPRAY | CUTANEOUS | Status: DC | PRN

## 2017-01-23 MED ORDER — ALTEPLASE 2 MG IJ SOLR: 2.0000 mg | Freq: Once | INTRAMUSCULAR | Status: DC | PRN

## 2017-01-23 MED ORDER — ONDANSETRON HCL 4 MG/2ML IJ SOLN: 4.0000 mg | Freq: Four times a day (QID) | INTRAMUSCULAR | Status: DC | PRN

## 2017-01-23 MED ORDER — HYDRALAZINE HCL 20 MG/ML IJ SOLN
5.0000 mg | Freq: Once | INTRAMUSCULAR | Status: AC
  Administered 2017-01-23: 5 mg via INTRAVENOUS
  Filled 2017-01-23: qty 1

## 2017-01-23 MED ORDER — FAMOTIDINE 20 MG PO TABS
20.0000 mg | ORAL_TABLET | Freq: Every day | ORAL | Status: DC
  Administered 2017-01-25: 20 mg via ORAL
  Filled 2017-01-23: qty 1

## 2017-01-23 MED ORDER — VITAMIN D 1000 UNITS PO TABS
1000.0000 [IU] | ORAL_TABLET | Freq: Every day | ORAL | Status: DC
  Administered 2017-01-23 – 2017-01-24 (×2): 1000 [IU] via ORAL
  Filled 2017-01-23 (×2): qty 1

## 2017-01-23 MED ORDER — PROSIGHT PO TABS
1.0000 | ORAL_TABLET | Freq: Every day | ORAL | Status: DC
  Administered 2017-01-23: 1 via ORAL
  Filled 2017-01-23 (×2): qty 1

## 2017-01-23 MED ORDER — INSULIN ASPART 100 UNIT/ML ~~LOC~~ SOLN
0.0000 [IU] | Freq: Three times a day (TID) | SUBCUTANEOUS | Status: DC
  Administered 2017-01-23: 1 [IU] via SUBCUTANEOUS
  Administered 2017-01-23: 3 [IU] via SUBCUTANEOUS
  Administered 2017-01-25: 1 [IU] via SUBCUTANEOUS

## 2017-01-23 MED ORDER — ACETAMINOPHEN 650 MG RE SUPP: 650.0000 mg | Freq: Four times a day (QID) | RECTAL | Status: DC | PRN

## 2017-01-23 MED ORDER — ALBUTEROL SULFATE (2.5 MG/3ML) 0.083% IN NEBU: 3.0000 mL | INHALATION_SOLUTION | RESPIRATORY_TRACT | Status: DC | PRN

## 2017-01-23 MED ORDER — ASPIRIN EC 81 MG PO TBEC
81.0000 mg | DELAYED_RELEASE_TABLET | Freq: Every day | ORAL | Status: DC
  Administered 2017-01-23 – 2017-01-25 (×3): 81 mg via ORAL
  Filled 2017-01-23 (×3): qty 1

## 2017-01-23 MED ORDER — NEPRO/CARBSTEADY PO LIQD
237.0000 mL | Freq: Two times a day (BID) | ORAL | Status: DC
  Administered 2017-01-24 – 2017-01-25 (×2): 237 mL via ORAL

## 2017-01-23 MED ORDER — ACETAMINOPHEN 325 MG PO TABS: 650.0000 mg | ORAL_TABLET | Freq: Four times a day (QID) | ORAL | Status: DC | PRN

## 2017-01-23 MED ORDER — LEVOTHYROXINE SODIUM 50 MCG PO TABS
175.0000 ug | ORAL_TABLET | Freq: Every day | ORAL | Status: DC
  Administered 2017-01-23 – 2017-01-25 (×3): 175 ug via ORAL
  Filled 2017-01-23 (×3): qty 1

## 2017-01-23 MED ORDER — POLYETHYLENE GLYCOL 3350 17 G PO PACK: 17.0000 g | PACK | Freq: Every day | ORAL | Status: DC | PRN

## 2017-01-23 MED ORDER — WARFARIN SODIUM 5 MG PO TABS
5.0000 mg | ORAL_TABLET | Freq: Once | ORAL | Status: AC
  Administered 2017-01-23: 5 mg via ORAL
  Filled 2017-01-23: qty 1

## 2017-01-23 MED ORDER — HEPARIN SODIUM (PORCINE) 1000 UNIT/ML DIALYSIS: 20.0000 [IU]/kg | INTRAMUSCULAR | Status: DC | PRN

## 2017-01-23 NOTE — Progress Notes (Signed)
ANTICOAGULATION CONSULT NOTE - Initial Consult  Pharmacy Consult for Coumadin Indication: atrial fibrillation  Allergies  Allergen Reactions  . Betadine [Povidone Iodine] Rash  . Iodinated Diagnostic Agents Rash and Other (See Comments)    Does fine with premedications for cath  . Latex Hives  . Tape Rash    MUST BE FREE OF ANY LATEX    Patient Measurements: Height: 5\' 10"  (177.8 cm) Weight: 169 lb (76.7 kg) IBW/kg (Calculated) : 73  Vital Signs: Temp: 98.7 F (37.1 C) (07/30 2357) Temp Source: Oral (07/30 2357) BP: 215/70 (07/31 0025) Pulse Rate: 73 (07/31 0025)  Labs:  Recent Labs  01/22/17 2340 01/23/17 0011  HGB 10.9* 10.5*  HCT 32.9* 31.0*  PLT 121*  --   CREATININE  --  5.50*    Estimated Creatinine Clearance: 11.1 mL/min (A) (by C-G formula based on SCr of 5.5 mg/dL (H)).   Medical History: Past Medical History:  Diagnosis Date  . Atrial fibrillation ([place]) Aug. 2015   a. Dx 01/2014, s/p DCCV 03/06/14.  Marland Kitchen CAD (coronary artery disease)    a. prior CABG in 1997 with redo in 2000. b. last cath in 2008 - managed medically  . Carotid disease, bilateral ([place])    with multiple bilateral carotid surgeries  . Chronic diastolic CHF (congestive heart failure) ([place])   . Chronic respiratory failure with hypoxia ([place])   . CKD (chronic kidney disease), stage IV ([employer])    a. Has fistula in place.   . Degenerative joint disease   . Dementia   . Generalized weakness    without overt findings  . GERD (gastroesophageal reflux disease)   . Gout   . Hyperlipidemia   . Hypertension   . Hypothyroidism   . Meniere's disease   . On home oxygen therapy    "2L" qhs (09/09/2015  . Orthopnea    Two-pillow  . Peripheral vascular disease ([place])   . Psoriasis   . Shingles   . Sinus bradycardia    a. Toprol D/C'd due to this.   . Type 2 diabetes mellitus ([place])     Assessment: 83yo male c/o CP associated w/ SOB, admitted for pulmonary edema, to continue Coumadin for Afib;  last dose of Coumadin taken 7/30 PTA.  Goal of Therapy:  INR 2-3   Plan:  Will monitor daily INR and adjust dose vs continue home regimen.  Wynona Neat, PharmD, BCPS  01/23/2017,1:04 AM

## 2017-01-23 NOTE — Consult Note (Signed)
[facility] Renal Consultation Note    Indication for Consultation:  Management of ESRD/hemodialysis, anemia, hypertension/volume, and secondary hyperparathyroidism. PCP:  HPI: [name] is a 83 y.o. male with ESRD, CAD, HTN, hypothyroidism, Type 2 DM, carotid stenosis, A-fib (on warfarin), and dementia who was admitted with dyspnea and chest pain in setting of missed dialysis.   [name] is often confused (baseline dementia). Per notes, he developed acute chest pain and dyspnea last evening. Brought in by EMS for evaluation. Labs stable, but CXR 7/31 consistent with interstitial edema. Currently, he is breathing comfortably in his room. Denies pain at this time. No fevers, chills, N/V or diarrhea.  From renal standpoint, he dialyzes MWF at [facility], but often skipping or cutting his HD time short. He missed yesterday's HD. Last dialyzed 7/27 for which he only stayed 2:20hr of 4 hr treatment, leaving 1.7L above his EDW. Before that, dialyzed 7/25 for 3:30h, 7/20 for 3:30h.  Past Medical History:  Diagnosis Date  . Atrial fibrillation ([place]) Aug. 2015   a. Dx 01/2014, s/p DCCV 03/06/14.  Marland Kitchen CAD (coronary artery disease)    a. prior CABG in 1997 with redo in 2000. b. last cath in 2008 - managed medically  . Carotid disease, bilateral ([place])    with multiple bilateral carotid surgeries  . Chronic diastolic CHF (congestive heart failure) ([place])   . Chronic respiratory failure with hypoxia ([place])   . CKD (chronic kidney disease), stage IV ([place])    a. Has fistula in place.   . Degenerative joint disease   . Dementia   . Generalized weakness    without overt findings  . GERD (gastroesophageal reflux disease)   . Gout   . Heart murmur   . Hyperlipidemia   . Hypertension   . Hypothyroidism   . Meniere's disease   . On home oxygen therapy    "2L" qhs (09/09/2015  . Orthopnea    Two-pillow  . Peripheral vascular disease ([place])   . Psoriasis   . Shingles   . Sinus bradycardia     a. Toprol D/C'd due to this.   . Type 2 diabetes mellitus ([place])    Past Surgical History:  Procedure Laterality Date  . APPENDECTOMY    . AV FISTULA PLACEMENT Left 01/21/2013   Procedure: ARTERIOVENOUS (AV) FISTULA CREATION, Left Brachiocephalic;  Surgeon: Angelia Mould, MD;  Location: [place];  Service: Vascular;  Laterality: Left;  . BLEPHAROPLASTY Bilateral   . CARDIAC CATHETERIZATION    . CARDIAC CATHETERIZATION  2008   L main irreg, LAD 80%, IMA-LAD & SVG-Diag patent, CFX 100%, SVG-OM 90%, RCA 70%, SVG-RCA OK, EF nl, med rx, no vessels appropriate for PCI  . CARDIAC CATHETERIZATION N/A 01/12/2016   Procedure: Left Heart Cath and Cors/Grafts Angiography;  Surgeon: Belva Crome, MD;  Location: [place] [facility];  Service: Cardiovascular;  Laterality: N/A;  . CARDIOVERSION N/A 03/06/2014   Procedure: CARDIOVERSION;  Surgeon: Larey Dresser, MD;  Location: [place];  Service: Cardiovascular;  Laterality: N/A;  . CAROTID ENDARTERECTOMY Bilateral "several times"   "5 on right; 2-3 on left" (09/09/2015)  . CORONARY ARTERY BYPASS GRAFT  1997;  2002  . FRACTURE SURGERY    . LUMBAR LAMINECTOMY  July 2001  . LUNG REMOVAL, PARTIAL    . SHOULDER SURGERY Bilateral   . TEE WITHOUT CARDIOVERSION N/A 03/06/2014   Procedure: TRANSESOPHAGEAL ECHOCARDIOGRAM (TEE);  Surgeon: Larey Dresser, MD;  Location: [place];  Service: Cardiovascular;  Laterality: N/A;  . THORACOTOMY Left    due to fungal infection   Family History  Problem Relation Age of Onset  . Heart attack Father   . Heart disease Father        After 89 yrs of age  . Hyperlipidemia Father   . Hypertension Father   . Diabetes Mother   . Hypertension Mother   . Heart disease Mother   . Heart disease Brother   . Diabetes Brother    Social History:  reports that he has been smoking Cigarettes.  He has never used smokeless tobacco. He reports that he does not drink alcohol or use drugs.  ROS: As per HPI otherwise  negative.  Physical Exam: Vitals:   01/23/17 0500 01/23/17 0530 01/23/17 0614 01/23/17 0953  BP: (!) 182/62 (!) 153/59 (!) 178/66 (!) 199/64  Pulse: 69 63 71 81  Resp: (!) 21 19 20 20   Temp:   97.9 F (36.6 C) 98 F (36.7 C)  TempSrc:   Oral Oral  SpO2: 98% 99% 95% 98%  Weight:   73.1 kg (161 lb 2.5 oz)   Height:   5\' 10"  (1.778 m)      General: Elderly male, in no acute distress. + chronic confusion/dementia. Head: Normocephalic, atraumatic, sclera non-icteric, mucus membranes are moist. Neck: Supple without lymphadenopathy/masses. JVD not elevated. Lungs: Clear bilaterally to auscultation without wheezes, rales, or rhonchi. Breathing is unlabored. Heart: RRR; 3/6 systolic murmur present. Abdomen: Soft, non-tender, non-distended with normoactive bowel sounds.  Musculoskeletal:  Strength and tone appear normal for age. Lower extremities: 1+ LE edema. Neuro: Awake/alert, but confusion with questioning. Moves all extremities spontaneously. Dialysis Access: AVF + bruit/thrill  Allergies  Allergen Reactions  . Betadine [Povidone Iodine] Rash  . Iodinated Diagnostic Agents Rash and Other (See Comments)    Does fine with premedications for cath  . Latex Hives  . Tape Rash    MUST BE FREE OF ANY LATEX   Prior to Admission medications   Medication Sig Start Date End Date Taking? Authorizing Provider  acetaminophen (TYLENOL) 325 MG tablet Take 2 tablets (650 mg total) by mouth every 4 (four) hours as needed for headache or mild pain. 09/29/15  Yes Clegg, Amy D, NP  albuterol (PROVENTIL HFA;VENTOLIN HFA) 108 (90 Base) MCG/ACT inhaler Inhale 2 puffs into the lungs every 4 (four) hours as needed for wheezing or shortness of breath. 12/30/16  Yes Caryl Ada K, PA-C  atorvastatin (LIPITOR) 80 MG tablet TAKE ONE TABLET BY MOUTH ONCE DAILY 02/26/15  Yes Nahser, Wonda Cheng, MD  calcium acetate (PHOSLO) 667 MG capsule Take 1,334 mg by mouth 3 (three) times daily with meals.   Yes [provider]  cholecalciferol (VITAMIN D) 1000 UNITS tablet Take 1,000 Units by mouth daily at 6 PM. D3   Yes [provider]  divalproex (DEPAKOTE SPRINKLE) 125 MG capsule Take 125 mg by mouth daily.  11/30/16  Yes [provider]  insulin aspart (NOVOLOG FLEXPEN) 100 UNIT/ML FlexPen Inject 8 Units into the skin 2 (two) times daily before a meal.   Yes [provider]  Insulin Glargine (LANTUS SOLOSTAR) 100 UNIT/ML SOPN Inject 24 Units into the skin every morning.    Yes [provider]  levothyroxine (SYNTHROID, LEVOTHROID) 175 MCG tablet Take 175 mcg by mouth daily before breakfast.   Yes [provider]  memantine (NAMENDA) 5 MG tablet Take 5 mg by mouth daily.  03/12/15  Yes [provider]  Multiple  Vitamins-Minerals (DECUBI-VITE) CAPS Take 1 capsule by mouth daily.   Yes [provider]  multivitamin (RENA-VIT) TABS tablet Take 1 tablet by mouth daily.   Yes [provider]  nitroGLYCERIN (NITROSTAT) 0.4 MG SL tablet Place 1 tablet (0.4 mg total) under the tongue every 5 (five) minutes as needed for chest pain. 01/12/16  Yes Rai, Ripudeep K, MD  polyethylene glycol (MIRALAX / GLYCOLAX) packet Take 17 g by mouth daily as needed for mild constipation.   Yes [provider]  ranitidine (ZANTAC) 300 MG tablet Take 300 mg by mouth daily. 12/04/15  Yes [provider]  warfarin (COUMADIN) 5 MG tablet Take 5-7.5 mg by mouth See admin instructions. Take 1 1/2 (7.5mg ) tablet on M, W, F, and 1 tablet (5mg ) on Sun, T, Th, Sat   Yes [provider]  sevelamer carbonate (RENVELA) 2.4 g PACK Take 2.4 g by mouth 3 (three) times daily. Patient not taking: Reported on 12/30/2016 09/29/15   Darrick Grinder D, NP  warfarin (COUMADIN) 5 MG tablet USE AS DIRECTED Patient not taking: Reported on 12/30/2016 12/15/16   Nahser, Wonda Cheng, MD   Current Facility-Administered Medications  Medication Dose Route Frequency Provider Last  Rate Last Dose  . acetaminophen (TYLENOL) tablet 650 mg  650 mg Oral Q6H PRN Rise Patience, MD       Or  . acetaminophen (TYLENOL) suppository 650 mg  650 mg Rectal Q6H PRN Rise Patience, MD      . albuterol (PROVENTIL) (2.5 MG/3ML) 0.083% nebulizer solution 3 mL  3 mL Inhalation Q4H PRN Rise Patience, MD      . aspirin EC tablet 81 mg  81 mg Oral Daily Rise Patience, MD   81 mg at 01/23/17 8828  . atorvastatin (LIPITOR) tablet 80 mg  80 mg Oral Daily Rise Patience, MD   80 mg at 01/23/17 0034  . calcium acetate (PHOSLO) capsule 1,334 mg  1,334 mg Oral TID WC Rise Patience, MD   1,334 mg at 01/23/17 0909  . cholecalciferol (VITAMIN D) tablet 1,000 Units  1,000 Units Oral q1800 Rise Patience, MD      . divalproex (DEPAKOTE SPRINKLE) capsule 125 mg  125 mg Oral Daily Rise Patience, MD   125 mg at 01/23/17 9179  . famotidine (PEPCID) tablet 20 mg  20 mg Oral BID Rise Patience, MD   20 mg at 01/23/17 1505  . hydrALAZINE (APRESOLINE) injection 10 mg  10 mg Intravenous Q4H PRN Rise Patience, MD      . insulin aspart (novoLOG) injection 0-9 Units  0-9 Units Subcutaneous TID WC Rise Patience, MD   1 Units at 01/23/17 [phone]  . insulin aspart (novoLOG) injection 8 Units  8 Units Subcutaneous BID AC Rise Patience, MD   8 Units at 01/23/17 [phone]  . insulin glargine (LANTUS) injection 24 Units  24 Units Subcutaneous q morning - 10a Rise Patience, MD   24 Units at 01/23/17 1051  . levothyroxine (SYNTHROID, LEVOTHROID) tablet 175 mcg  175 mcg Oral QAC breakfast Rise Patience, MD   175 mcg at 01/23/17 1052  . memantine (NAMENDA) tablet 5 mg  5 mg Oral Daily Rise Patience, MD   5 mg at 01/23/17 6553  . multivitamin (PROSIGHT) tablet 1 tablet  1 tablet Oral Daily Rise Patience, MD   1 tablet at 01/23/17 7482  . multivitamin (RENA-VIT) tablet 1 tablet  1 tablet Oral  Daily Rise Patience, MD   1 tablet  at 01/23/17 9562  . ondansetron (ZOFRAN) tablet 4 mg  4 mg Oral Q6H PRN Rise Patience, MD       Or  . ondansetron [facility]) injection 4 mg  4 mg Intravenous Q6H PRN Rise Patience, MD      . polyethylene glycol (MIRALAX / GLYCOLAX) packet 17 g  17 g Oral Daily PRN Rise Patience, MD      . warfarin (COUMADIN) tablet 5 mg  5 mg Oral Elwanda Brooklyn, MD      . Warfarin - Pharmacist Dosing Inpatient   Does not apply q1800 Laren Everts [facility]       Labs: Basic Metabolic Panel:  Recent Labs Lab 01/23/17 0011 01/23/17 0111 01/23/17 0610  NA 139  --  138  K 4.6  --  4.6  CL 100*  --  104  CO2  --   --  24  GLUCOSE 224*  --  174*  BUN 70*  --  70*  CREATININE 5.50* 5.44* 5.21*  CALCIUM  --   --  8.9   CBC:  Recent Labs Lab 01/22/17 2340 01/23/17 0011 01/23/17 0610  WBC 6.0  --  6.6  NEUTROABS 4.6  --   --   HGB 10.9* 10.5* 10.8*  HCT 32.9* 31.0* 32.5*  MCV 96.8  --  95.6  PLT 121*  --  123*   Cardiac Enzymes:  Recent Labs Lab 01/23/17 0111 01/23/17 0610  TROPONINI 0.25* 0.32*   CBG:  Recent Labs Lab 01/22/17 2355 01/23/17 0738  GLUCAP 215* 148*   Studies/Results: Dg Chest Portable 1 View  Result Date: 01/23/2017 CLINICAL DATA:  Chest pain tonight EXAM: PORTABLE CHEST 1 VIEW COMPARISON:  12/30/2016 FINDINGS: Postoperative changes in the mediastinum. Shallow inspiration with linear atelectasis in the lung bases, mostly on the left. Heart size and pulmonary vascularity are normal. Developing interstitial changes in the lower lungs may indicate developing edema or interstitial pneumonitis. Mild blunting of costophrenic angles suggesting small effusions. No pneumothorax. Calcified and tortuous aorta. Vascular stent projected over the left axilla. Degenerative changes in the shoulders. IMPRESSION: Shallow inspiration with atelectasis in the lung bases. Developing interstitial pattern to the lungs may represent interstitial edema or  pneumonitis. Blunting of the costophrenic angles may indicate tiny pleural effusions. Electronically Signed   By: Lucienne Capers M.D.   On: 01/23/2017 00:07   Dialysis Orders:  MWF at [facility] 4 hr, BFR 400, DFR 800, 2K/2Ca bath, EDW 78kg, AVF, Heparin 8K bolus - Venofer 50mg  weekly, No ESA or VDRA.  Assessment/Plan: 1.  Pulmonary edema: Will dialyze today. Per weights, he is below outpt EDW. Will challenge as tolerated. 2.  ESRD: Usually MWF schedule, missed HD 7/30, Will dialyze today, then tomorrow again to get back on schedule. 3.  Hypertension/volume: BP high, should improve with HD. 4.  Anemia: Hgb 10.8. No ESA for now. 5.  Metabolic bone disease: Ca 8.9, Phos pending. Continue Phoslo as binder for now. Outpt med list also shows Renvela pwdr, will need to clarify with this wife. 6. Dementia: On Namenda, per primary. 7. A-fib (on warfarin): Per primary. 8. DM: On insulin, per primary.  Veneta Penton, PA-C 01/23/2017, 10:57 AM  [facility] Pager: [phone]

## 2017-01-23 NOTE — Progress Notes (Signed)
ANTICOAGULATION CONSULT NOTE - Initial Consult  Pharmacy Consult for Coumadin Indication: atrial fibrillation  Allergies  Allergen Reactions  . Betadine [Povidone Iodine] Rash  . Iodinated Diagnostic Agents Rash and Other (See Comments)    Does fine with premedications for cath  . Latex Hives  . Tape Rash    MUST BE FREE OF ANY LATEX    Patient Measurements: Height: 5\' 10"  (177.8 cm) Weight: 161 lb 2.5 oz (73.1 kg) IBW/kg (Calculated) : 73  Vital Signs: Temp: 98 F (36.7 C) (07/31 0953) Temp Source: Oral (07/31 0953) BP: 199/64 (07/31 0953) Pulse Rate: 81 (07/31 0953)  Labs:  Recent Labs  01/22/17 2340 01/23/17 0011 01/23/17 0111 01/23/17 0610  HGB 10.9* 10.5*  --  10.8*  HCT 32.9* 31.0*  --  32.5*  PLT 121*  --   --  123*  LABPROT  --   --   --  26.6*  26.1*  INR  --   --   --  2.40  2.35  CREATININE  --  5.50* 5.44* 5.21*  TROPONINI  --   --  0.25* 0.32*    Estimated Creatinine Clearance: 11.7 mL/min (A) (by C-G formula based on SCr of 5.21 mg/dL (H)).   Medical History: Past Medical History:  Diagnosis Date  . Atrial fibrillation ([place]) Aug. 2015   a. Dx 01/2014, s/p DCCV 03/06/14.  Marland Kitchen CAD (coronary artery disease)    a. prior CABG in 1997 with redo in 2000. b. last cath in 2008 - managed medically  . Carotid disease, bilateral ([place])    with multiple bilateral carotid surgeries  . Chronic diastolic CHF (congestive heart failure) ([place])   . Chronic respiratory failure with hypoxia ([place])   . CKD (chronic kidney disease), stage IV ([place])    a. Has fistula in place.   . Degenerative joint disease   . Dementia   . Generalized weakness    without overt findings  . GERD (gastroesophageal reflux disease)   . Gout   . Heart murmur   . Hyperlipidemia   . Hypertension   . Hypothyroidism   . Meniere's disease   . On home oxygen therapy    "2L" qhs (09/09/2015  . Orthopnea    Two-pillow  . Peripheral vascular disease ([place])   . Psoriasis   . Shingles   .  Sinus bradycardia    a. Toprol D/C'd due to this.   . Type 2 diabetes mellitus ([place])     Assessment: 83yo male c/o CP associated w/ SOB, admitted for pulmonary edema due to missing  dialysis on 7/30. Patient  to continue warfarin for Afib; last dose of warfarin taken 7/30 PTA. INR is 2.40. Pharmacy consulted to dose warfarin. Hgb stable, no bleeding reported.  Warfarin PTA dose is 7.5 mg (1.5 tablets) po MWF, and 5 mg (1 tablet) po all other days  Goal of Therapy:  INR 2-3    Plan:  Resume home regimen Administer warfarin 5 mg po x 1 Monitor daily INR, CBC, and s/sx of bleeding  Leroy Libman, PharmD Pharmacy Resident Pager: [phone]  01/23/2017,10:05 AM

## 2017-01-23 NOTE — ED Notes (Signed)
IV in left hand inserted by EMS PTA

## 2017-01-23 NOTE — ED Notes (Signed)
Margarette (wife) [phone] Aaron Edelman (son) [phone] Would like updates when possible if not in room

## 2017-01-23 NOTE — ED Notes (Signed)
Family informed RN they were leaving for the evening.  Pt in bed comfortable "all I want to do is sleep."  Pt reports he is comfortable at this time.

## 2017-01-23 NOTE — Progress Notes (Addendum)
PROGRESS NOTE  [name]  GYI:[id number] DOB: 12-26-1933 DOA: 01/22/2017 PCP: Leanna Battles, MD  Brief Narrative:   [name] is a 83 y.o. male with ESRD, CAD, HTN, hypothyroidism, Type 2 DM, carotid stenosis, A-fib (on warfarin), and dementia who was admitted with dyspnea and chest pain in setting of missed dialysis.  According to nephrology, he has been truncating his dialysis sessions, including on Friday.  Monday, he felt unwell and may have been having some diarrhea so he declined to go to hemodialysis.  He developed acute chest pain and dyspnea and came to the ER where CXR demonstrated interstitial edema.  Nephrology is scheduling back to back HD today and tomorrow.  His troponins have been slightly elevated but fairly flat and ECHO is pending.  Assessment & Plan:   Active Problems:   Diabetes mellitus with peripheral vascular disease (HCC)   CAD (coronary artery disease)   Atrial fibrillation (HCC)   Chronic combined systolic and diastolic CHF (congestive heart failure) (HCC)   Hypothyroidism   ESRD on dialysis [facility])   Fluid overload   Hypertensive urgency  Acute respiratory failure with hypoxia secondary to pulmonary edema due to missed/truncated hemodialysis -  Nephrology consultation for HD -  Per notes, planning to dialyze today and tomorrow -  May be ready for discharge after HD tomorrow depending on progression  Chest pain with history of CAD s/p CABG, last cardiac cath in July 2017 - patient's chest pain improved with sublingual nitroglycerin -  Continue statin and Coumadin -  Not on aspirin, possibly due to being on warfarin and bleeding risk -  troponins mildly elevated  Chronic combined systolic and diastolic heart failure (although most recent EF had recovered to 55-60%) -  Volume management with HD -  Agree with hydralazine for blood pressure management -  Not on ACEI/ARB due to ESRD -  Consider starting low dose beta-blocker on non-dialysis  days  Hypertensive urgency, blood pressure still quite elevated  -  BP appears to have dropped when starting hemodialysis  Paroxysmal atrial fibrillation, tele:  NSR.  Chronic atrial fibrillation, CHADs2vasc = 5  Needs anticoagulation.   -  Coumadin per pharmacy, therapeutic INR  Anemia of ESRD -  hgb at baseline of 10-11g/dL  ESRD on MWF -  Nephrology consultation  Hypothyroidism, stable, continue synthroid  Diabetes mellitus type 2, CBG mildly elevated -  A1c 6.3 09/2016 -  Continue lantus 24 units -  Continue aspart 8 units BID AC -  Continue low dose SSI  Dementia, stable, on namenda  DVT prophylaxis:  Coumadin  Code Status:  DNR Family Communication:  Patient alone Disposition Plan:  Likely home in 1-2 days   Consultants:   Nephrology  Procedures:  ECHO pending  Antimicrobials:  Anti-infectives    None       Subjective: Patient states he feels a little Stanisha Lorenz of breath and is coughing.  He relays that he has been frustrated by some of the staff at the dialysis center.  He had to "go #2" the other day, he states and they ignored him three times until he finally got up and they rushed over.  He also was angry about someone putting a clamp on his arm and when they removed it,  He had copious bleeding.  Does not want to stop HD, but frustrated at times.  Had loose but not watery stools yesterday and felt bad.    Objective: Vitals:   01/23/17 1330 01/23/17 1335 01/23/17  1340 01/23/17 1400  BP: (!) 194/73 (!) 187/73 (!) 176/70 (!) 142/65  Pulse: 71 68 66 68  Resp: (!) 23     Temp: 98.2 F (36.8 C)     TempSrc: Oral     SpO2: 96%     Weight: 78.6 kg (173 lb 4.5 oz)     Height:        Intake/Output Summary (Last 24 hours) at 01/23/17 1435 Last data filed at 01/23/17 1100  Gross per 24 hour  Intake              240 ml  Output              550 ml  Net             -310 ml   Filed Weights   01/22/17 2340 01/23/17 0614 01/23/17 1330  Weight: 76.7 kg (169  lb) 73.1 kg (161 lb 2.5 oz) 78.6 kg (173 lb 4.5 oz)    Examination:  General exam:  Adult male.  No acute distress.  HEENT:  NCAT, MMM Respiratory system:  Diminished with some wheezes and rales at the bilateral bases, no rhonchi Cardiovascular system: Regular rate and rhythm, normal P1/W2 3/6 systolic murmur at the RUSB crescendo-decrescendo like AS.  Warm extremities Gastrointestinal system: Normal active bowel sounds, soft, nondistended, nontender. MSK:  Normal tone and bulk, 1+ pitting bilateral lower extremity edema Neuro:  Grossly intact    Data Reviewed: I have personally reviewed following labs and imaging studies  CBC:  Recent Labs Lab 01/22/17 2340 01/23/17 0011 01/23/17 0610  WBC 6.0  --  6.6  NEUTROABS 4.6  --   --   HGB 10.9* 10.5* 10.8*  HCT 32.9* 31.0* 32.5*  MCV 96.8  --  95.6  PLT 121*  --  585*   Basic Metabolic Panel:  Recent Labs Lab 01/23/17 0011 01/23/17 0111 01/23/17 0610  NA 139  --  138  K 4.6  --  4.6  CL 100*  --  104  CO2  --   --  24  GLUCOSE 224*  --  174*  BUN 70*  --  70*  CREATININE 5.50* 5.44* 5.21*  CALCIUM  --   --  8.9   GFR: Estimated Creatinine Clearance: 11.7 mL/min (A) (by C-G formula based on SCr of 5.21 mg/dL (H)). Liver Function Tests: No results for input(s): AST, ALT, ALKPHOS, BILITOT, PROT, ALBUMIN in the last 168 hours. No results for input(s): LIPASE, AMYLASE in the last 168 hours. No results for input(s): AMMONIA in the last 168 hours. Coagulation Profile:  Recent Labs Lab 01/23/17 0610  INR 2.40  2.35   Cardiac Enzymes:  Recent Labs Lab 01/23/17 0111 01/23/17 0610 01/23/17 1238  TROPONINI 0.25* 0.32* 0.22*   BNP (last 3 results) No results for input(s): PROBNP in the last 8760 hours. HbA1C: No results for input(s): HGBA1C in the last 72 hours. CBG:  Recent Labs Lab 01/22/17 2355 01/23/17 0738 01/23/17 1110  GLUCAP 215* 148* 203*   Lipid Profile: No results for input(s): CHOL, HDL,  LDLCALC, TRIG, CHOLHDL, LDLDIRECT in the last 72 hours. Thyroid Function Tests: No results for input(s): TSH, T4TOTAL, FREET4, T3FREE, THYROIDAB in the last 72 hours. Anemia Panel: No results for input(s): VITAMINB12, FOLATE, FERRITIN, TIBC, IRON, RETICCTPCT in the last 72 hours. Urine analysis:    Component Value Date/Time   COLORURINE YELLOW 09/17/2015 1815   APPEARANCEUR CLOUDY (A) 09/17/2015 1815   LABSPEC 1.010 09/17/2015 1815  PHURINE 5.0 09/17/2015 1815   GLUCOSEU NEGATIVE 09/17/2015 1815   HGBUR SMALL (A) 09/17/2015 1815   BILIRUBINUR NEGATIVE 09/17/2015 1815   KETONESUR NEGATIVE 09/17/2015 1815   PROTEINUR NEGATIVE 09/17/2015 1815   UROBILINOGEN 1.0 03/23/2014 2156   NITRITE NEGATIVE 09/17/2015 1815   LEUKOCYTESUR LARGE (A) 09/17/2015 1815   Sepsis Labs: @LABRCNTIP (procalcitonin:4,lacticidven:4)  )No results found for this or any previous visit (from the past 240 hour(s)).    Radiology Studies: Dg Chest Portable 1 View  Result Date: 01/23/2017 CLINICAL DATA:  Chest pain tonight EXAM: PORTABLE CHEST 1 VIEW COMPARISON:  12/30/2016 FINDINGS: Postoperative changes in the mediastinum. Shallow inspiration with linear atelectasis in the lung bases, mostly on the left. Heart size and pulmonary vascularity are normal. Developing interstitial changes in the lower lungs may indicate developing edema or interstitial pneumonitis. Mild blunting of costophrenic angles suggesting small effusions. No pneumothorax. Calcified and tortuous aorta. Vascular stent projected over the left axilla. Degenerative changes in the shoulders. IMPRESSION: Shallow inspiration with atelectasis in the lung bases. Developing interstitial pattern to the lungs may represent interstitial edema or pneumonitis. Blunting of the costophrenic angles may indicate tiny pleural effusions. Electronically Signed   By: Lucienne Capers M.D.   On: 01/23/2017 00:07     Scheduled Meds: . aspirin EC  81 mg Oral Daily  .  atorvastatin  80 mg Oral Daily  . calcium acetate  1,334 mg Oral TID WC  . cholecalciferol  1,000 Units Oral q1800  . divalproex  125 mg Oral Daily  . famotidine  20 mg Oral BID  . insulin aspart  0-9 Units Subcutaneous TID WC  . insulin aspart  8 Units Subcutaneous BID AC  . insulin glargine  24 Units Subcutaneous q morning - 10a  . levothyroxine  175 mcg Oral QAC breakfast  . memantine  5 mg Oral Daily  . multivitamin  1 tablet Oral Daily  . multivitamin  1 tablet Oral Daily  . warfarin  5 mg Oral ONCE-1800  . Warfarin - Pharmacist Dosing Inpatient   Does not apply q1800   Continuous Infusions: . sodium chloride    . sodium chloride       LOS: 0 days    Time spent: 30 min    Janece Canterbury, MD [facility] Pager [phone]  If 7PM-7AM, please contact night-coverage [email or web address] Password [email or web address] 01/23/2017, 2:35 PM

## 2017-01-23 NOTE — Procedures (Signed)
Patient seen on Hemodialysis. QB 400, UF goal 3.5L Treatment adjusted as needed.  Elmarie Shiley MD [facility]. Office # [phone] Pager # [phone] 2:42 PM

## 2017-01-23 NOTE — H&P (Signed)
History and Physical    [name] KZS:[id number] DOB: [birth year] DOA: 01/22/2017  PCP: Leanna Battles, MD  Patient coming from: Home.  Chief Complaint: Chest pain and shortness of breath.  HPI: [name] is a 83 y.o. male with history of CAD status post CABG last cardiac cath in July 2017, ESRD on hemodialysis on Monday Wednesday and Friday, hypertension diabetes mellitus , paroxysmal atrial fibrillation and hypothyroidism was brought to the ER after patient complained of chest pain and shortness of breath. As per patient's wife who provided the history patient last evening around 10 PM while on the Reclast or developing left-sided sharp shooting chest pain with shortness of breath. Patient had missed his dialysis on Monday. EMS was called and patient was brought to the ER.  ED Course: In the ER patient's blood pressures for the markedly elevated and for the chest pain patient was given 3 sublingual nitroglycerin and aspirin following which patient's chest pain improved. Blood pressure also improved. Troponin is mildly elevated EKG was showing nonspecific findings. Chest x-ray was showing features consistent with fluid overload. Patient at time of my exam is chest pain-free and not in distress. Patient is being admitted for further management of fluid overload chest pain and hypertensive urgency.  Review of Systems: As per HPI, rest all negative.   Past Medical History:  Diagnosis Date  . Atrial fibrillation ([place]) Aug. 2015   a. Dx 01/2014, s/p DCCV 03/06/14.  Marland Kitchen CAD (coronary artery disease)    a. prior CABG in 1997 with redo in 2000. b. last cath in 2008 - managed medically  . Carotid disease, bilateral ([place])    with multiple bilateral carotid surgeries  . Chronic diastolic CHF (congestive heart failure) ([place])   . Chronic respiratory failure with hypoxia ([place])   . CKD (chronic kidney disease), stage IV ([place])    a. Has fistula in place.   . Degenerative joint disease   .  Dementia   . Generalized weakness    without overt findings  . GERD (gastroesophageal reflux disease)   . Gout   . Hyperlipidemia   . Hypertension   . Hypothyroidism   . Meniere's disease   . On home oxygen therapy    "2L" qhs (09/09/2015  . Orthopnea    Two-pillow  . Peripheral vascular disease ([place])   . Psoriasis   . Shingles   . Sinus bradycardia    a. Toprol D/C'd due to this.   . Type 2 diabetes mellitus ([place])     Past Surgical History:  Procedure Laterality Date  . APPENDECTOMY    . AV FISTULA PLACEMENT Left 01/21/2013   Procedure: ARTERIOVENOUS (AV) FISTULA CREATION, Left Brachiocephalic;  Surgeon: Angelia Mould, MD;  Location: [place];  Service: Vascular;  Laterality: Left;  . BLEPHAROPLASTY Bilateral   . CARDIAC CATHETERIZATION    . CARDIAC CATHETERIZATION  2008   L main irreg, LAD 80%, IMA-LAD & SVG-Diag patent, CFX 100%, SVG-OM 90%, RCA 70%, SVG-RCA OK, EF nl, med rx, no vessels appropriate for PCI  . CARDIAC CATHETERIZATION N/A 01/12/2016   Procedure: Left Heart Cath and Cors/Grafts Angiography;  Surgeon: Belva Crome, MD;  Location: [place] [facility];  Service: Cardiovascular;  Laterality: N/A;  . CARDIOVERSION N/A 03/06/2014   Procedure: CARDIOVERSION;  Surgeon: Larey Dresser, MD;  Location: [place];  Service: Cardiovascular;  Laterality: N/A;  . CAROTID ENDARTERECTOMY Bilateral "several times"   "5 on right; 2-3 on left" (09/09/2015)  .  CORONARY ARTERY BYPASS GRAFT  1997;  2002  . LUMBAR LAMINECTOMY  July 2001  . LUNG REMOVAL, PARTIAL    . SHOULDER SURGERY Bilateral   . TEE WITHOUT CARDIOVERSION N/A 03/06/2014   Procedure: TRANSESOPHAGEAL ECHOCARDIOGRAM (TEE);  Surgeon: Larey Dresser, MD;  Location: [facility];  Service: Cardiovascular;  Laterality: N/A;  . THORACOTOMY Left    due to fungal infection     reports that he has never smoked. He has never used smokeless tobacco. He reports that he does not drink alcohol or use drugs.  Allergies    Allergen Reactions  . Betadine [Povidone Iodine] Rash  . Iodinated Diagnostic Agents Rash and Other (See Comments)    Does fine with premedications for cath  . Latex Hives  . Tape Rash    MUST BE FREE OF ANY LATEX    Family History  Problem Relation Age of Onset  . Heart attack Father   . Heart disease Father        After 74 yrs of age  . Hyperlipidemia Father   . Hypertension Father   . Diabetes Mother   . Hypertension Mother   . Heart disease Mother   . Heart disease Brother   . Diabetes Brother     Prior to Admission medications   Medication Sig Start Date End Date Taking? Authorizing Provider  acetaminophen (TYLENOL) 325 MG tablet Take 2 tablets (650 mg total) by mouth every 4 (four) hours as needed for headache or mild pain. 09/29/15  Yes Clegg, Amy D, NP  albuterol (PROVENTIL HFA;VENTOLIN HFA) 108 (90 Base) MCG/ACT inhaler Inhale 2 puffs into the lungs every 4 (four) hours as needed for wheezing or shortness of breath. 12/30/16  Yes Caryl Ada K, PA-C  atorvastatin (LIPITOR) 80 MG tablet TAKE ONE TABLET BY MOUTH ONCE DAILY 02/26/15  Yes Nahser, Wonda Cheng, MD  calcium acetate (PHOSLO) 667 MG capsule Take 1,334 mg by mouth 3 (three) times daily with meals.   Yes [provider]  cholecalciferol (VITAMIN D) 1000 UNITS tablet Take 1,000 Units by mouth daily at 6 PM. D3   Yes [provider]  divalproex (DEPAKOTE SPRINKLE) 125 MG capsule Take 125 mg by mouth daily.  11/30/16  Yes [provider]  insulin aspart (NOVOLOG FLEXPEN) 100 UNIT/ML FlexPen Inject 8 Units into the skin 2 (two) times daily before a meal.   Yes [provider]  Insulin Glargine (LANTUS SOLOSTAR) 100 UNIT/ML SOPN Inject 24 Units into the skin every morning.    Yes [provider]  levothyroxine (SYNTHROID, LEVOTHROID) 175 MCG tablet Take 175 mcg by mouth daily before breakfast.   Yes [provider]  memantine (NAMENDA) 5 MG tablet Take 5 mg by mouth daily.   03/12/15  Yes [provider]  Multiple Vitamins-Minerals (DECUBI-VITE) CAPS Take 1 capsule by mouth daily.   Yes [provider]  multivitamin (RENA-VIT) TABS tablet Take 1 tablet by mouth daily.   Yes [provider]  nitroGLYCERIN (NITROSTAT) 0.4 MG SL tablet Place 1 tablet (0.4 mg total) under the tongue every 5 (five) minutes as needed for chest pain. 01/12/16  Yes Rai, Ripudeep K, MD  polyethylene glycol (MIRALAX / GLYCOLAX) packet Take 17 g by mouth daily as needed for mild constipation.   Yes [provider]  ranitidine (ZANTAC) 300 MG tablet Take 300 mg by mouth daily. 12/04/15  Yes [provider]  warfarin (COUMADIN) 5 MG tablet Take 5-7.5 mg by mouth See admin instructions.  Take 1 1/2 (7.5mg ) tablet on M, W, F, and 1 tablet (5mg ) on Sun, T, Th, Sat   Yes [provider]  sevelamer carbonate (RENVELA) 2.4 g PACK Take 2.4 g by mouth 3 (three) times daily. Patient not taking: Reported on 12/30/2016 09/29/15   Darrick Grinder D, NP  warfarin (COUMADIN) 5 MG tablet USE AS DIRECTED Patient not taking: Reported on 12/30/2016 12/15/16   Thayer Headings, MD    Physical Exam: Vitals:   01/22/17 2340 01/22/17 2357 01/23/17 0015 01/23/17 0025  BP:  (!) 199/80 (!) 209/66 (!) 215/70  Pulse:  70 73 73  Resp:  20 (!) 26 (!) 32  Temp:  98.7 F (37.1 C)    TempSrc:  Oral    SpO2:  100% 100% 100%  Weight: 76.7 kg (169 lb)     Height: 5\' 10"  (1.778 m)         Constitutional: Moderately built and nourished. Vitals:   01/22/17 2340 01/22/17 2357 01/23/17 0015 01/23/17 0025  BP:  (!) 199/80 (!) 209/66 (!) 215/70  Pulse:  70 73 73  Resp:  20 (!) 26 (!) 32  Temp:  98.7 F (37.1 C)    TempSrc:  Oral    SpO2:  100% 100% 100%  Weight: 76.7 kg (169 lb)     Height: 5\' 10"  (1.778 m)      Eyes: Anicteric no pallor. ENMT: No discharge from the ears eyes nose and mouth. Neck: No mass felt. No neck rigidity. Respiratory: No rhonchi or  crepitations. Cardiovascular: S1-S2 heard no murmurs appreciated. Abdomen: Soft nontender bowel sounds present. Musculoskeletal: No edema. No joint effusion. Skin: No rash. Skin appears warm. Neurologic: Alert awake oriented to his name and place. Moves all extremities. Psychiatric: Has dementia.   Labs on Admission: I have personally reviewed following labs and imaging studies  CBC:  Recent Labs Lab 01/22/17 2340 01/23/17 0011  WBC 6.0  --   NEUTROABS 4.6  --   HGB 10.9* 10.5*  HCT 32.9* 31.0*  MCV 96.8  --   PLT PENDING  --    Basic Metabolic Panel:  Recent Labs Lab 01/23/17 0011  NA 139  K 4.6  CL 100*  GLUCOSE 224*  BUN 70*  CREATININE 5.50*   GFR: Estimated Creatinine Clearance: 11.1 mL/min (A) (by C-G formula based on SCr of 5.5 mg/dL (H)). Liver Function Tests: No results for input(s): AST, ALT, ALKPHOS, BILITOT, PROT, ALBUMIN in the last 168 hours. No results for input(s): LIPASE, AMYLASE in the last 168 hours. No results for input(s): AMMONIA in the last 168 hours. Coagulation Profile: No results for input(s): INR, PROTIME in the last 168 hours. Cardiac Enzymes: No results for input(s): CKTOTAL, CKMB, CKMBINDEX, TROPONINI in the last 168 hours. BNP (last 3 results) No results for input(s): PROBNP in the last 8760 hours. HbA1C: No results for input(s): HGBA1C in the last 72 hours. CBG:  Recent Labs Lab 01/22/17 2355  GLUCAP 215*   Lipid Profile: No results for input(s): CHOL, HDL, LDLCALC, TRIG, CHOLHDL, LDLDIRECT in the last 72 hours. Thyroid Function Tests: No results for input(s): TSH, T4TOTAL, FREET4, T3FREE, THYROIDAB in the last 72 hours. Anemia Panel: No results for input(s): VITAMINB12, FOLATE, FERRITIN, TIBC, IRON, RETICCTPCT in the last 72 hours. Urine analysis:    Component Value Date/Time   COLORURINE YELLOW 09/17/2015 1815   APPEARANCEUR CLOUDY (A) 09/17/2015 1815   LABSPEC 1.010 09/17/2015 1815   PHURINE 5.0 09/17/2015 1815    GLUCOSEU  NEGATIVE 09/17/2015 1815   HGBUR SMALL (A) 09/17/2015 1815   BILIRUBINUR NEGATIVE 09/17/2015 1815   KETONESUR NEGATIVE 09/17/2015 1815   PROTEINUR NEGATIVE 09/17/2015 1815   UROBILINOGEN 1.0 03/23/2014 2156   NITRITE NEGATIVE 09/17/2015 1815   LEUKOCYTESUR LARGE (A) 09/17/2015 1815   Sepsis Labs: @LABRCNTIP (procalcitonin:4,lacticidven:4) )No results found for this or any previous visit (from the past 240 hour(s)).   Radiological Exams on Admission: Dg Chest Portable 1 View  Result Date: 01/23/2017 CLINICAL DATA:  Chest pain tonight EXAM: PORTABLE CHEST 1 VIEW COMPARISON:  12/30/2016 FINDINGS: Postoperative changes in the mediastinum. Shallow inspiration with linear atelectasis in the lung bases, mostly on the left. Heart size and pulmonary vascularity are normal. Developing interstitial changes in the lower lungs may indicate developing edema or interstitial pneumonitis. Mild blunting of costophrenic angles suggesting small effusions. No pneumothorax. Calcified and tortuous aorta. Vascular stent projected over the left axilla. Degenerative changes in the shoulders. IMPRESSION: Shallow inspiration with atelectasis in the lung bases. Developing interstitial pattern to the lungs may represent interstitial edema or pneumonitis. Blunting of the costophrenic angles may indicate tiny pleural effusions. Electronically Signed   By: Lucienne Capers M.D.   On: 01/23/2017 00:07    EKG: Independently reviewed. Normal sinus rhythm with LVH.  Assessment/Plan Active Problems:   Diabetes mellitus with peripheral vascular disease (HCC)   CAD (coronary artery disease)   Atrial fibrillation (HCC)   Chronic combined systolic and diastolic CHF (congestive heart failure) (HCC)   Hypothyroidism   ESRD on dialysis ([place])   Fluid overload   Hypertensive urgency    1. Acute respiratory failure with hypoxia secondary to fluid overload after missing dialysis - patient is not in distress at this time.  Please notify nephrology in a.m. for dialysis. 2. Chest pain with history of CAD status post CABG last cardiac cath in July 2017 - patient's chest pain improved with sublingual nitroglycerin. Patient is on statins and Coumadin. We will cycle cardiac markers and check 2-D echo. Chest pain could be from uncontrolled blood pressure and missing dialysis. 3. Hypertensive urgency - blood pressure improved with sublingual nitroglycerin and I have placed patient on when necessary IV hydralazine. I think blood pressure will improve with dialysis. 4. Paroxysmal atrial fibrillation presently in sinus rhythm - patient is on Coumadin. Chads 2 vasc score is more than 2. Not on any rate limiting medications. 5. Anemia probably from ESRD - follow CBC. 6. ESRD on hemodialysis on Monday Wednesday and Friday - patient missed dialysis last Monday. See #1. 7. Hypothyroidism on Synthroid. 8. Diabetes mellitus type 2 on Lantus 24 units in a.m. and NovoLog 8 units twice a day. 9. Dementia on Namenda.  I have reviewed patient's old charts and labs.   DVT prophylaxis: Coumadin. Code Status: DO NOT RESUSCITATE.  Family Communication: Patient's wife.  Disposition Plan: Home.  Consults called: None.  Admission status: Observation.    Rise Patience MD [facility] Pager [phone].  If 7PM-7AM, please contact night-coverage [email or web address] Password [facility]  01/23/2017, 12:57 AM

## 2017-01-23 NOTE — Progress Notes (Signed)
Pt to floor via wheelchair and was transferred to bed with three people assist. Initial assessment done and he was assisted to urinate using the urinal. A/Ox3 and able to verbalize needs. He was situated comfortable on the bed and room orientation was made. Call bell within reach along with room phone. Yellow socks provided and tele monitor attached. Bedside table within reach and admission papers signed. Will report to incoming staff.

## 2017-01-23 NOTE — Progress Notes (Signed)
Initial Nutrition Assessment  DOCUMENTATION CODES:   Not applicable  INTERVENTION:   Nepro Shake po BID, each supplement provides 425 kcal and 19 grams protein  NUTRITION DIAGNOSIS:   Increased nutrient needs related to chronic illness as evidenced by estimated needs.  GOAL:   Patient will meet greater than or equal to 90% of their needs  MONITOR:   PO intake, Supplement acceptance, Labs, Weight trends  REASON FOR ASSESSMENT:   Malnutrition Screening Tool    ASSESSMENT:   83 yo male admitted with acute respiratory failure with pulmonary edema after missed HD. Pt with hx of CAD s/p CABG (12/2015), ESRD on HD, CHF, dementia, generalized weakness, HLD, HTN, PVD, DM  Recorded po intake 100% at breakfast  HD today with UF goal of 3.5 L  EDW 78 kg, current wt of 78.6 kg  Unable to complete Nutrition-Focused physical exam at this time.   Labs: CBGs 148-203, phosphorus pending Meds: phoslo, renvela, ss novolog, lantus, rena-vit  Diet Order:  Diet renal/carb modified with fluid restriction Diet-HS Snack? Nothing; Room service appropriate? Yes; Fluid consistency: Thin  Skin:  Reviewed, no issues  Last BM:  no documented BM  Height:   Ht Readings from Last 1 Encounters:  01/23/17 5\' 10"  (1.778 m)    Weight:   Wt Readings from Last 1 Encounters:  01/23/17 173 lb 4.5 oz (78.6 kg)    Ideal Body Weight:     BMI:  Body mass index is 24.86 kg/m.  Estimated Nutritional Needs:   Kcal:  5170-0174 kcals  Protein:  88-99 g   Fluid:  1000 mL plus UOP  EDUCATION NEEDS:   No education needs identified at this time  [place], [place], LDN [phone] Pager  [phone] Weekend/On-Call Pager

## 2017-01-23 NOTE — Progress Notes (Signed)
Received report from ED RN. Room ready for patient. Lititia Sen Joselita, RN 

## 2017-01-24 ENCOUNTER — Inpatient Hospital Stay (HOSPITAL_COMMUNITY): Payer: Medicare Other

## 2017-01-24 DIAGNOSIS — E8770 Fluid overload, unspecified: Secondary | ICD-10-CM

## 2017-01-24 DIAGNOSIS — E1151 Type 2 diabetes mellitus with diabetic peripheral angiopathy without gangrene: Secondary | ICD-10-CM

## 2017-01-24 DIAGNOSIS — I5042 Chronic combined systolic (congestive) and diastolic (congestive) heart failure: Secondary | ICD-10-CM

## 2017-01-24 DIAGNOSIS — Z992 Dependence on renal dialysis: Secondary | ICD-10-CM

## 2017-01-24 DIAGNOSIS — I482 Chronic atrial fibrillation: Secondary | ICD-10-CM

## 2017-01-24 DIAGNOSIS — R072 Precordial pain: Secondary | ICD-10-CM

## 2017-01-24 DIAGNOSIS — N186 End stage renal disease: Secondary | ICD-10-CM

## 2017-01-24 DIAGNOSIS — I5020 Unspecified systolic (congestive) heart failure: Secondary | ICD-10-CM

## 2017-01-24 DIAGNOSIS — I160 Hypertensive urgency: Secondary | ICD-10-CM

## 2017-01-24 DIAGNOSIS — J810 Acute pulmonary edema: Secondary | ICD-10-CM

## 2017-01-24 LAB — RENAL FUNCTION PANEL
ALBUMIN: 3.5 g/dL (ref 3.5–5.0)
Anion gap: 9 (ref 5–15)
BUN: 47 mg/dL — AB (ref 6–20)
CALCIUM: 8.7 mg/dL — AB (ref 8.9–10.3)
CHLORIDE: 97 mmol/L — AB (ref 101–111)
CO2: 28 mmol/L (ref 22–32)
CREATININE: 4.54 mg/dL — AB (ref 0.61–1.24)
GFR calc Af Amer: 13 mL/min — ABNORMAL LOW (ref >60)
GFR calc non Af Amer: 11 mL/min — ABNORMAL LOW (ref >60)
GLUCOSE: 225 mg/dL — AB (ref 65–99)
PHOSPHORUS: 3.8 mg/dL (ref 2.5–4.6)
POTASSIUM: 4.1 mmol/L (ref 3.5–5.1)
Sodium: 134 mmol/L — ABNORMAL LOW (ref 135–145)

## 2017-01-24 LAB — ECHOCARDIOGRAM COMPLETE
AOVTI: 125 cm
AV Area mean vel: 0.58 cm2
AV area mean vel ind: 0.3 cm2/m2
AV pk vel: 472 cm/s
AV vel: 0.62
AVA: 0.62 cm2
AVAREAVTI: 0.66 cm2
AVAREAVTIIND: 0.32 cm2/m2
AVCELMEANRAT: 0.2
AVG: 57 mmHg
AVPG: 89 mmHg
AVPHT: 415 ms
Ao pk vel: 0.23 m/s
CHL CUP AV PEAK INDEX: 0.34
CHL CUP AV VALUE AREA INDEX: 0.32
CHL CUP MV DEC (S): 363
DOP CAL AO MEAN VELOCITY: 354 cm/s
E decel time: 363 msec
E/e' ratio: 32.25
FS: 24 % — AB (ref 28–44)
Height: 70 in
IV/PV OW: 0.84
LA diam index: 2.78 cm/m2
LA vol A4C: 94 ml
LASIZE: 54 mm
LAVOL: 121 mL
LAVOLIN: 62.3 mL/m2
LEFT ATRIUM END SYS DIAM: 54 mm
LV PW d: 15.3 mm — AB (ref 0.6–1.1)
LV e' LATERAL: 3.69 cm/s
LVEEAVG: 32.25
LVEEMED: 32.25
LVOT VTI: 27.1 cm
LVOT area: 2.84 cm2
LVOT peak VTI: 0.22 cm
LVOT peak vel: 110 cm/s
LVOTD: 19 mm
LVOTSV: 77 mL
MV pk A vel: 133 m/s
MV pk E vel: 119 m/s
MVAP: 1.58 cm2
MVPG: 6 mmHg
P 1/2 time: 140 ms
RV LATERAL S' VELOCITY: 8.18 cm/s
RV sys press: 28 mmHg
Reg peak vel: 252 cm/s
TAPSE: 22 mm
TDI e' lateral: 3.69
TDI e' medial: 3.19
TRMAXVEL: 252 cm/s
Weight: 2680.79 oz

## 2017-01-24 LAB — CBC
HCT: 30 % — ABNORMAL LOW (ref 39.0–52.0)
HCT: 30.3 % — ABNORMAL LOW (ref 39.0–52.0)
HEMOGLOBIN: 9.8 g/dL — AB (ref 13.0–17.0)
Hemoglobin: 10 g/dL — ABNORMAL LOW (ref 13.0–17.0)
MCH: 30.9 pg (ref 26.0–34.0)
MCH: 31.2 pg (ref 26.0–34.0)
MCHC: 32.7 g/dL (ref 30.0–36.0)
MCHC: 33 g/dL (ref 30.0–36.0)
MCV: 94.6 fL (ref 78.0–100.0)
MCV: 94.4 fL (ref 78.0–100.0)
PLATELETS: 107 10*3/uL — AB (ref 150–400)
PLATELETS: 115 10*3/uL — AB (ref 150–400)
RBC: 3.17 MIL/uL — AB (ref 4.22–5.81)
RBC: 3.21 MIL/uL — ABNORMAL LOW (ref 4.22–5.81)
RDW: 14.4 % (ref 11.5–15.5)
RDW: 14.2 % (ref 11.5–15.5)
WBC: 4.3 10*3/uL (ref 4.0–10.5)
WBC: 5.5 10*3/uL (ref 4.0–10.5)

## 2017-01-24 LAB — PROTIME-INR
INR: 2.78
Prothrombin Time: 29.9 seconds — ABNORMAL HIGH (ref 11.4–15.2)

## 2017-01-24 LAB — GLUCOSE, CAPILLARY
GLUCOSE-CAPILLARY: 114 mg/dL — AB (ref 65–99)
GLUCOSE-CAPILLARY: 115 mg/dL — AB (ref 65–99)
GLUCOSE-CAPILLARY: 161 mg/dL — AB (ref 65–99)
Glucose-Capillary: 113 mg/dL — ABNORMAL HIGH (ref 65–99)

## 2017-01-24 LAB — MRSA PCR SCREENING: MRSA by PCR: POSITIVE — AB

## 2017-01-24 MED ORDER — RENA-VITE PO TABS
1.0000 | ORAL_TABLET | Freq: Every day | ORAL | Status: DC
  Administered 2017-01-25: 1 via ORAL
  Filled 2017-01-24: qty 1

## 2017-01-24 MED ORDER — WARFARIN SODIUM 5 MG PO TABS
5.0000 mg | ORAL_TABLET | Freq: Once | ORAL | Status: AC
  Administered 2017-01-24: 5 mg via ORAL
  Filled 2017-01-24: qty 1

## 2017-01-24 MED ORDER — MUPIROCIN 2 % EX OINT
1.0000 "application " | TOPICAL_OINTMENT | Freq: Two times a day (BID) | CUTANEOUS | Status: DC
  Administered 2017-01-24 – 2017-01-25 (×4): 1 via NASAL
  Filled 2017-01-24 (×2): qty 22

## 2017-01-24 MED ORDER — CHLORHEXIDINE GLUCONATE CLOTH 2 % EX PADS: 6.0000 | MEDICATED_PAD | Freq: Every day | CUTANEOUS | Status: DC

## 2017-01-24 MED ORDER — HEPARIN SODIUM (PORCINE) 1000 UNIT/ML DIALYSIS
20.0000 [IU]/kg | INTRAMUSCULAR | Status: DC | PRN
  Administered 2017-01-24: 1500 [IU] via INTRAVENOUS_CENTRAL

## 2017-01-24 MED ORDER — MUPIROCIN 2 % EX OINT: 1.0000 "application " | TOPICAL_OINTMENT | Freq: Two times a day (BID) | CUTANEOUS | 0 refills | Status: DC

## 2017-01-24 NOTE — Progress Notes (Signed)
[facility] Progress Note   Subjective:  Seen in room, resting comfortably in bed. Denies CP or dyspnea today. No overnight events.  Objective Vitals:   01/23/17 1650 01/23/17 2027 01/24/17 0612 01/24/17 0913  BP: 120/61 (!) 154/42 (!) 155/52 (!) 163/51  Pulse: 72 66 (!) 59 60  Resp: (!) 22 19 18 18   Temp: 98.4 F (36.9 C) 98.7 F (37.1 C) 98.3 F (36.8 C) 97.9 F (36.6 C)  TempSrc: Oral   Oral  SpO2: 96% 95% 97% 98%  Weight: 76.8 kg (169 lb 5 oz) 76 kg (167 lb 8.8 oz)    Height:       Physical Exam General: Elderly male, in no acute distress. + chronic confusion/dementia. Lungs: Clear bilaterally to auscultation without wheezes, rales, or rhonchi. Breathing is unlabored. Heart: RRR; 3/6 systolic murmur present. Abdomen: Soft, non-tender, non-distended with normoactive bowel sounds.  Musculoskeletal:  Strength and tone appear normal for age. Lower extremities: No LE edema. Neuro: Awake/alert, but confusion with questioning. Moves all extremities spontaneously. Dialysis Access: AVF + bruit/thrill  Additional Objective Labs: Basic Metabolic Panel:  Recent Labs Lab 01/23/17 0011 01/23/17 0111 01/23/17 0610  NA 139  --  138  K 4.6  --  4.6  CL 100*  --  104  CO2  --   --  24  GLUCOSE 224*  --  174*  BUN 70*  --  70*  CREATININE 5.50* 5.44* 5.21*  CALCIUM  --   --  8.9   CBC:  Recent Labs Lab 01/22/17 2340 01/23/17 0011 01/23/17 0610 01/24/17 0402  WBC 6.0  --  6.6 4.3  NEUTROABS 4.6  --   --   --   HGB 10.9* 10.5* 10.8* 9.8*  HCT 32.9* 31.0* 32.5* 30.0*  MCV 96.8  --  95.6 94.6  PLT 121*  --  123* 107*   Cardiac Enzymes:  Recent Labs Lab 01/23/17 0111 01/23/17 0610 01/23/17 1238  TROPONINI 0.25* 0.32* 0.22*   CBG:  Recent Labs Lab 01/23/17 0738 01/23/17 1110 01/23/17 1727 01/23/17 2051 01/24/17 0745  GLUCAP 148* 203* 89 236* 113*   Studies/Results: Dg Chest Portable 1 View  Result Date: 01/23/2017 CLINICAL DATA:   Chest pain tonight EXAM: PORTABLE CHEST 1 VIEW COMPARISON:  12/30/2016 FINDINGS: Postoperative changes in the mediastinum. Shallow inspiration with linear atelectasis in the lung bases, mostly on the left. Heart size and pulmonary vascularity are normal. Developing interstitial changes in the lower lungs may indicate developing edema or interstitial pneumonitis. Mild blunting of costophrenic angles suggesting small effusions. No pneumothorax. Calcified and tortuous aorta. Vascular stent projected over the left axilla. Degenerative changes in the shoulders. IMPRESSION: Shallow inspiration with atelectasis in the lung bases. Developing interstitial pattern to the lungs may represent interstitial edema or pneumonitis. Blunting of the costophrenic angles may indicate tiny pleural effusions. Electronically Signed   By: Lucienne Capers M.D.   On: 01/23/2017 00:07   Medications:  . aspirin EC  81 mg Oral Daily  . atorvastatin  80 mg Oral Daily  . calcium acetate  1,334 mg Oral TID WC  . Chlorhexidine Gluconate Cloth  6 each Topical Q0600  . cholecalciferol  1,000 Units Oral q1800  . divalproex  125 mg Oral Daily  . famotidine  20 mg Oral QHS  . feeding supplement (NEPRO CARB STEADY)  237 mL Oral BID BM  . insulin aspart  0-9 Units Subcutaneous TID WC  . insulin aspart  8 Units Subcutaneous BID AC  .  insulin glargine  24 Units Subcutaneous q morning - 10a  . levothyroxine  175 mcg Oral QAC breakfast  . memantine  5 mg Oral Daily  . multivitamin  1 tablet Oral QHS  . mupirocin ointment  1 application Nasal BID  . warfarin  5 mg Oral ONCE-1800  . Warfarin - Pharmacist Dosing Inpatient   Does not apply q1800    Dialysis Orders: MWF at [facility] 4 hr, BFR 400, DFR 800, 2K/2Ca bath, EDW 78kg, AVF, Heparin 8K bolus - Venofer 50mg  weekly, No ESA or VDRA.  Assessment/Plan: 1.  Pulmonary edema: S/p HD 7/31 for dyspnea, symptoms resolved at this time.  2.  ESRD: Usually MWF schedule, missed HD 7/30. Underwent  HD 7/31, plan for HD again today to get back on MWF schedule. Post-HD weight down to 76.8, will plan to reduce EDW on discharge. 3.  Hypertension/volume: BP high, should improve with HD. 4.  Anemia: Hgb 9.8. Will plan to restart ESA as outpt. 5.  Metabolic bone disease: Ca 8.9, Phos pending. Continue Phoslo as binder for now. Outpt med list also shows Renvela pwdr, will need to clarify with this wife. 6. Dementia: On Namenda, per primary. 7. A-fib (on warfarin): Per primary. 8. DM: On insulin, per primary.  Veneta Penton, PA-C 01/24/2017, 10:01 AM  [facility] Pager: [phone]

## 2017-01-24 NOTE — Progress Notes (Signed)
Pt will be dialized this evenikng between 7pm and 9pm and discharged in the morning per Dr Melvia Heaps.

## 2017-01-24 NOTE — Progress Notes (Signed)
HD tx initiated via 15G x2 w/o problem, pull/push/flush equally w/o problem, VSS, will cont to monitor while on HD tx. Also, removed pt's nitro paste patch when her arrived to unit prior to taking pre tx VS

## 2017-01-24 NOTE — Progress Notes (Signed)
[place] for warfarin Indication: atrial fibrillation  Allergies  Allergen Reactions  . Betadine [Povidone Iodine] Rash  . Iodinated Diagnostic Agents Rash and Other (See Comments)    Does fine with premedications for cath  . Latex Hives  . Tape Rash    MUST BE FREE OF ANY LATEX    Patient Measurements: Height: 5\' 10"  (177.8 cm) Weight: 167 lb 8.8 oz (76 kg) IBW/kg (Calculated) : 73  Vital Signs: Temp: 97.9 F (36.6 C) (08/01 0913) Temp Source: Oral (08/01 0913) BP: 163/51 (08/01 0913) Pulse Rate: 60 (08/01 0913)  Labs:  Recent Labs  01/22/17 2340 01/23/17 0011 01/23/17 0111 01/23/17 0610 01/23/17 1238 01/24/17 0402  HGB 10.9* 10.5*  --  10.8*  --  9.8*  HCT 32.9* 31.0*  --  32.5*  --  30.0*  PLT 121*  --   --  123*  --  107*  LABPROT  --   --   --  26.6*  26.1*  --  29.9*  INR  --   --   --  2.40  2.35  --  2.78  CREATININE  --  5.50* 5.44* 5.21*  --   --   TROPONINI  --   --  0.25* 0.32* 0.22*  --     Estimated Creatinine Clearance: 11.7 mL/min (A) (by C-G formula based on SCr of 5.21 mg/dL (H)).   Assessment: 83yo male c/o CP associated w/ SOB, admitted for pulmonary edema due to missing dialysis on 7/30. Went to HD on 7/31 and plans to go again today.  On warfarin PTA for AFib- home dose is 7.5 mg (1.5 tablets) po MWF, and 5 mg (1 tablet) po all other days. Last dose taken PTA was on 7/30- admission INR therapeutic at 2.4.  INR this morning 2.78. Hgb 9.8, plts 107- no bleeding noted.  Goal of Therapy:  INR 2-3    Plan:  Warfarin 5 mg po x 1 today Monitor daily INR, CBC, and s/sx of bleeding If patient discharges today, recommend him taking warfarin 5mg  tonight and then resuming prior home dose starting 8/2.  Midori Dado D. Zyrell Carmean, PharmD, BCPS Clinical Pharmacist Pager: [phone] Clinical Phone for 01/24/2017 until 3:30pm: [phone] If after 3:30pm, please call main pharmacy at [phone] 01/24/2017 9:59 AM

## 2017-01-24 NOTE — Progress Notes (Signed)
  Echocardiogram 2D Echocardiogram has been performed.  Daryon Remmert G Ramsha Lonigro 01/24/2017, 2:46 PM

## 2017-01-24 NOTE — Discharge Instructions (Signed)
You are being discharged to home.  Continue your outpatient dialysis schedule please after discharge.

## 2017-01-24 NOTE — Discharge Summary (Signed)
Physician Discharge Summary  Patient ID: [name] MRN: [id number] DOB/AGE: 81-May-1935 81 y.o.  Admit date: 01/22/2017 Discharge date: 01/24/2017  Admission Diagnoses:  Pulmonary edema   Volume overload   ESRD with missed dialysis   Diabetes mellitus with peripheral vascular disease (HCC)   CAD (coronary artery disease)   Atrial fibrillation (HCC)   Chronic combined systolic and diastolic CHF (congestive heart failure) ([place])   Hypothyroidism    Discharge Diagnoses:  Active Problems:   Pulmonary edema - resolved   Volume overload - better   ESRD with missed dialysis - sp HD   Diabetes mellitus with peripheral vascular disease (HCC)   CAD (coronary artery disease)   Atrial fibrillation (HCC)   Chronic combined systolic and diastolic CHF (congestive heart failure) ([place])   Hypothyroidism   Discharged Condition: good  Hospital Course:  1.  Dyspnea/ acute resp failure/ pulm edema / vol overload - from missing dialysis most likely. Pt had HD the day of admission and will get another HD today prior to dc.  SOB and resp failure resolved.  2.  ESRD on HD 3   DM w/ PVD 4   DM2 no change in meds 5   Chronic atrial fib - resume usual coumadin at dc  Consults: nephrology  Significant Diagnostic Studies: none  Treatments: dialysis: Hemodialysis  Discharge Exam: Blood pressure (!) 163/51, pulse 60, temperature 97.9 F (36.6 C), temperature source Oral, resp. rate 18, height 5\' 10"  (1.778 m), weight 76 kg (167 lb 8.8 oz), SpO2 98 %. Alert, no distress, up in chair No jvd Chest CTA bilat RRR no mrg Abd soft ntnd no ascites Ext no sig edema AVF upper arm +bruit   Disposition: 01-Home or Self Care   Allergies as of 01/24/2017      Reactions   Betadine [povidone Iodine] Rash   Iodinated Diagnostic Agents Rash, Other (See Comments)   Does fine with premedications for cath   Latex Hives   Tape Rash   MUST BE FREE OF ANY LATEX      Medication List    TAKE these  medications   acetaminophen 325 MG tablet Commonly known as:  TYLENOL Take 2 tablets (650 mg total) by mouth every 4 (four) hours as needed for headache or mild pain.   albuterol 108 (90 Base) MCG/ACT inhaler Commonly known as:  PROVENTIL HFA;VENTOLIN HFA Inhale 2 puffs into the lungs every 4 (four) hours as needed for wheezing or shortness of breath.   atorvastatin 80 MG tablet Commonly known as:  LIPITOR TAKE ONE TABLET BY MOUTH ONCE DAILY   calcium acetate 667 MG capsule Commonly known as:  PHOSLO Take 1,334 mg by mouth 3 (three) times daily with meals.   cholecalciferol 1000 units tablet Commonly known as:  VITAMIN D Take 1,000 Units by mouth daily at 6 PM. D3   DECUBI-VITE Caps Take 1 capsule by mouth daily.   divalproex 125 MG capsule Commonly known as:  DEPAKOTE SPRINKLE Take 125 mg by mouth daily.   LANTUS SOLOSTAR 100 UNIT/ML Solostar Pen Generic drug:  Insulin Glargine Inject 24 Units into the skin every morning.   levothyroxine 175 MCG tablet Commonly known as:  SYNTHROID, LEVOTHROID Take 175 mcg by mouth daily before breakfast.   memantine 5 MG tablet Commonly known as:  NAMENDA Take 5 mg by mouth daily.   multivitamin Tabs tablet Take 1 tablet by mouth daily.   mupirocin ointment 2 % Commonly known as:  Gerty 1 application  into the nose 2 (two) times daily.   nitroGLYCERIN 0.4 MG SL tablet Commonly known as:  NITROSTAT Place 1 tablet (0.4 mg total) under the tongue every 5 (five) minutes as needed for chest pain.   NOVOLOG FLEXPEN 100 UNIT/ML FlexPen Generic drug:  insulin aspart Inject 8 Units into the skin 2 (two) times daily before a meal.   polyethylene glycol packet Commonly known as:  MIRALAX / GLYCOLAX Take 17 g by mouth daily as needed for mild constipation.   ranitidine 300 MG tablet Commonly known as:  ZANTAC Take 300 mg by mouth daily.   sevelamer carbonate 2.4 g Pack Commonly known as:  RENVELA Take 2.4 g by mouth 3  (three) times daily.   warfarin 5 MG tablet Commonly known as:  COUMADIN Take 5-7.5 mg by mouth See admin instructions. Take 1 1/2 (7.5mg ) tablet on M, W, F, and 1 tablet (5mg ) on Sun, T, Th, Sat   warfarin 5 MG tablet Commonly known as:  COUMADIN USE AS DIRECTED        Signed: Dewon Mendizabal D 01/24/2017, 2:16 PM

## 2017-01-25 LAB — CBC
HEMATOCRIT: 31.7 % — AB (ref 39.0–52.0)
HEMOGLOBIN: 10.7 g/dL — AB (ref 13.0–17.0)
MCH: 32.2 pg (ref 26.0–34.0)
MCHC: 33.8 g/dL (ref 30.0–36.0)
MCV: 95.5 fL (ref 78.0–100.0)
Platelets: 112 10*3/uL — ABNORMAL LOW (ref 150–400)
RBC: 3.32 MIL/uL — AB (ref 4.22–5.81)
RDW: 14.6 % (ref 11.5–15.5)
WBC: 5.3 10*3/uL (ref 4.0–10.5)

## 2017-01-25 LAB — GLUCOSE, CAPILLARY
GLUCOSE-CAPILLARY: 110 mg/dL — AB (ref 65–99)
GLUCOSE-CAPILLARY: 132 mg/dL — AB (ref 65–99)

## 2017-01-25 LAB — PROTIME-INR
INR: 2.46
PROTHROMBIN TIME: 27.1 s — AB (ref 11.4–15.2)

## 2017-01-25 MED ORDER — WARFARIN SODIUM 7.5 MG PO TABS: 7.5000 mg | ORAL_TABLET | ORAL | Status: DC

## 2017-01-25 MED ORDER — WARFARIN SODIUM 5 MG PO TABS: 5.0000 mg | ORAL_TABLET | ORAL | Status: DC

## 2017-01-25 NOTE — Progress Notes (Signed)
[facility] Progress Note   Subjective: Sitting on side of bed eating breakfast. No C/Os.   Objective Vitals:   01/25/17 0055 01/25/17 0122 01/25/17 0209 01/25/17 0455  BP: (!) 106/53 132/71 (!) 105/45 (!) 156/42  Pulse: 74 60 73 (!) 56  Resp: (!) 22 14 16 16   Temp:  97.9 F (36.6 C) 97.8 F (36.6 C) 98 F (36.7 C)  TempSrc:  Oral    SpO2: 94% 97% 97% 98%  Weight:  69.6 kg (153 lb 7 oz)    Height:       Physical Exam General: Elderly male in NAD Heart: K1,S0, 2/6 systolic M. No JVD. Lungs: CTAB A/P Abdomen: active BS Extremities: No LE edema.  Dialysis Access: LUA AVF + bruit   Additional Objective Labs: Basic Metabolic Panel:  Recent Labs Lab 01/23/17 0011 01/23/17 0111 01/23/17 0610 01/24/17 2045  NA 139  --  138 134*  K 4.6  --  4.6 4.1  CL 100*  --  104 97*  CO2  --   --  24 28  GLUCOSE 224*  --  174* 225*  BUN 70*  --  70* 47*  CREATININE 5.50* 5.44* 5.21* 4.54*  CALCIUM  --   --  8.9 8.7*  PHOS  --   --   --  3.8   Liver Function Tests:  Recent Labs Lab 01/24/17 2045  ALBUMIN 3.5   No results for input(s): LIPASE, AMYLASE in the last 168 hours. CBC:  Recent Labs Lab 01/22/17 2340  01/23/17 0610 01/24/17 0402 01/24/17 2045 01/25/17 0401  WBC 6.0  --  6.6 4.3 5.5 5.3  NEUTROABS 4.6  --   --   --   --   --   HGB 10.9*  < > 10.8* 9.8* 10.0* 10.7*  HCT 32.9*  < > 32.5* 30.0* 30.3* 31.7*  MCV 96.8  --  95.6 94.6 94.4 95.5  PLT 121*  --  123* 107* 115* 112*  < > = values in this interval not displayed. Blood Culture    Component Value Date/Time   SDES INDUCED SPUTUM 09/19/2015 1956   SPECREQUEST NONE 09/19/2015 1956   CULT  09/19/2015 1956    MODERATE STAPHYLOCOCCUS AUREUS Note: RIFAMPIN AND GENTAMICIN SHOULD NOT BE USED AS SINGLE DRUGS FOR TREATMENT OF STAPH INFECTIONS. Performed at [facility] 09/24/2015 FINAL 09/19/2015 1956    Cardiac Enzymes:  Recent Labs Lab 01/23/17 0111  01/23/17 0610 01/23/17 1238  TROPONINI 0.25* 0.32* 0.22*   CBG:  Recent Labs Lab 01/24/17 1204 01/24/17 1639 01/24/17 1953 01/25/17 0206 01/25/17 0807  GLUCAP 114* 115* 161* 110* 132*   Iron Studies: No results for input(s): IRON, TIBC, TRANSFERRIN, FERRITIN in the last 72 hours. @lablastinr3 @ Studies/Results: No results found. Medications:  . aspirin EC  81 mg Oral Daily  . atorvastatin  80 mg Oral Daily  . calcium acetate  1,334 mg Oral TID WC  . Chlorhexidine Gluconate Cloth  6 each Topical Q0600  . cholecalciferol  1,000 Units Oral q1800  . divalproex  125 mg Oral Daily  . famotidine  20 mg Oral QHS  . feeding supplement (NEPRO CARB STEADY)  237 mL Oral BID BM  . insulin aspart  0-9 Units Subcutaneous TID WC  . insulin aspart  8 Units Subcutaneous BID AC  . insulin glargine  24 Units Subcutaneous q morning - 10a  . levothyroxine  175 mcg Oral QAC breakfast  . memantine  5  mg Oral Daily  . multivitamin  1 tablet Oral QHS  . mupirocin ointment  1 application Nasal BID  . warfarin  5 mg Oral Once per day on Sun Tue Thu Sat  . [START ON 01/26/2017] warfarin  7.5 mg Oral Once per day on Mon Wed Fri  . Warfarin - Pharmacist Dosing Inpatient   Does not apply q1800     Dialysis Orders: MWF at [facility] 4 hr, BFR 400, DFR 800, 2K/2Ca bath, EDW 78kg, AVF, Heparin 8K bolus - Venofer 50mg  weekly, No ESA or VDRA.  Assessment/Plan: 1. Pulmonary edema: S/p HD 7/31 for dyspnea, symptoms resolved at this time.  2. ESRD: Usually MWF schedule, missed HD 7/30. Underwent HD 7/31, plan for HD again today to get back on MWF schedule. Post-HD weight down to 76.8, will plan to reduce EDW on discharge. 3. Hypertension/volume: BP controlled. No LE edema. HD yesterday pre wt 72.1 kg Net UF 2.5 Post wt. 69.6 kg. Needs to run full treatments as OP.  Lower EDW on DC.  4. Anemia: Hgb 10.7. Will plan to restart ESA as outpt. 5. Metabolic bone disease: Ca 8.9, Phos 3.8. Continue  binders. 6. Dementia: On Namenda, per primary. 7. A-fib (on warfarin): Per primary. 8. DM:On insulin, per primary.  Disposition: For DC today. Will go home. HD tomorrow at [facility].  Ania Levay H. Kirbie Stodghill NP-C 01/25/2017, 8:48 AM  [employer] [phone]

## 2017-01-25 NOTE — Progress Notes (Signed)
[place] for warfarin Indication: atrial fibrillation  Allergies  Allergen Reactions  . Betadine [Povidone Iodine] Rash  . Iodinated Diagnostic Agents Rash and Other (See Comments)    Does fine with premedications for cath  . Latex Hives  . Tape Rash    MUST BE FREE OF ANY LATEX    Patient Measurements: Height: 5\' 10"  (177.8 cm) Weight: 153 lb 7 oz (69.6 kg) IBW/kg (Calculated) : 73  Vital Signs: Temp: 98 F (36.7 C) (08/02 0455) Temp Source: Oral (08/02 0122) BP: 156/42 (08/02 0455) Pulse Rate: 56 (08/02 0455)  Labs:  Recent Labs  01/23/17 0111 01/23/17 0610 01/23/17 1238 01/24/17 0402 01/24/17 2045 01/25/17 0401  HGB  --  10.8*  --  9.8* 10.0* 10.7*  HCT  --  32.5*  --  30.0* 30.3* 31.7*  PLT  --  123*  --  107* 115* 112*  LABPROT  --  26.6*  26.1*  --  29.9*  --  27.1*  INR  --  2.40  2.35  --  2.78  --  2.46  CREATININE 5.44* 5.21*  --   --  4.54*  --   TROPONINI 0.25* 0.32* 0.22*  --   --   --     Estimated Creatinine Clearance: 12.8 mL/min (A) (by C-G formula based on SCr of 4.54 mg/dL (H)).   Assessment: 83yo male c/o CP associated w/ SOB, admitted for pulmonary edema due to missing dialysis on 7/30. Went to HD on 7/31 and plans to go again today.  On warfarin PTA for AFib- home dose is 7.5 mg (1.5 tablets) po MWF, and 5 mg (1 tablet) po all other days. Last dose taken PTA was on 7/30- admission INR therapeutic at 2.4.  INR this morning 2.46. Hgb 10.7, plts 112- no bleeding noted.  Goal of Therapy:  INR 2-3    Plan:  Resume home dose of warfarin 5mg  daily except 7.5mg  on MWF Monitor daily INR, CBC, and s/sx of bleeding   Avery Eustice D. Ashwath Lasch, PharmD, BCPS Clinical Pharmacist Pager: [phone] Clinical Phone for 01/25/2017 until 3:30pm: [phone] If after 3:30pm, please call main pharmacy at [phone] 01/25/2017 7:57 AM

## 2017-01-25 NOTE — Progress Notes (Addendum)
HD tx completed @ 9558 w/o problem, UF goal met, blood rinsed back, report called to Percell Boston, RN

## 2017-01-25 NOTE — Progress Notes (Signed)
Discharge instructions, medications/prescriptions and follow up appointments discussed and reviewed with pt and wife, verbalized understanding. Telemetry dc'ed. CCMD notified.  IV dc'ed, site clean and dry. Pt was escorted out of the unit in wheelchair, took all belongings with him.

## 2017-01-25 NOTE — Progress Notes (Signed)
D/C was postponed as pt needed HD yesterday , he ended up getting dialysis overnight/ early this am.  This am he is calm, exam unchanged, no c/o's and questions answered.  DC home.    Kelly Splinter MD [facility] pgr [phone] 01/25/2017, 8:02 AM

## 2017-01-25 NOTE — Consult Note (Signed)
   [facility] CM Inpatient Consult   01/25/2017  [name] [birth year] [id number]  Referral received from inpatient RNCM, Almyra Free regarding disease management in this patient with HX of systolic and diastolic HF, Atrial Fib,  and CKD on HD MWF at [facility]. Met with patient and his wife, [name], at the bedside.  Patient sitting in chair ready for discharge.  Explained services from referral for [facility].  Wife states she feels like she does not have a need at this time.  States that she feels like they are managing well.  He has HD and usually goes around 11 am til near 5 pm.  She declined services with [facility] at this time. A brochure, 24 hour nurse advise line magnet and contact information was given.  Encouraged her to call at anytime for needs. She endorses Dr. Leanna Battles as his primary care provider, and denies any difficulties with medications, transportation or resource s needed.  For questions, please contact:  Natividad Brood, RN BSN [facility] Liaison  [phone] business mobile phone Toll free office [phone]

## 2017-01-26 ENCOUNTER — Telehealth: Payer: Self-pay | Admitting: *Deleted

## 2017-01-26 DIAGNOSIS — E1129 Type 2 diabetes mellitus with other diabetic kidney complication: Secondary | ICD-10-CM | POA: Diagnosis not present

## 2017-01-26 DIAGNOSIS — D631 Anemia in chronic kidney disease: Secondary | ICD-10-CM | POA: Diagnosis not present

## 2017-01-26 DIAGNOSIS — N186 End stage renal disease: Secondary | ICD-10-CM | POA: Diagnosis not present

## 2017-01-26 DIAGNOSIS — D509 Iron deficiency anemia, unspecified: Secondary | ICD-10-CM | POA: Diagnosis not present

## 2017-01-26 DIAGNOSIS — N2581 Secondary hyperparathyroidism of renal origin: Secondary | ICD-10-CM | POA: Diagnosis not present

## 2017-01-26 DIAGNOSIS — Z23 Encounter for immunization: Secondary | ICD-10-CM | POA: Diagnosis not present

## 2017-01-26 NOTE — Telephone Encounter (Signed)
Wife called to inform [facility] that the pt is having another Fistulagram. She was unsure if the pt needed to hold his Coumadin or not. Advised that we do not make the determination but the doctor doing the procedure makes that decision & then Cardiology looks at the Clearance form & approves, etc. Called [facility] & she stated the pt does not need to hold Coumadin for the procedure on Tuesday. Pt & wife aware that pt does not have to hold Coumadin.   Pt was in hospital on 01/22/17-01/24/17 & will need an appt the end of next week. Wife will bring pt to appt on 02/01/17

## 2017-01-29 DIAGNOSIS — Z23 Encounter for immunization: Secondary | ICD-10-CM | POA: Diagnosis not present

## 2017-01-29 DIAGNOSIS — D631 Anemia in chronic kidney disease: Secondary | ICD-10-CM | POA: Diagnosis not present

## 2017-01-29 DIAGNOSIS — E1129 Type 2 diabetes mellitus with other diabetic kidney complication: Secondary | ICD-10-CM | POA: Diagnosis not present

## 2017-01-29 DIAGNOSIS — N2581 Secondary hyperparathyroidism of renal origin: Secondary | ICD-10-CM | POA: Diagnosis not present

## 2017-01-29 DIAGNOSIS — N186 End stage renal disease: Secondary | ICD-10-CM | POA: Diagnosis not present

## 2017-01-29 DIAGNOSIS — D509 Iron deficiency anemia, unspecified: Secondary | ICD-10-CM | POA: Diagnosis not present

## 2017-01-31 DIAGNOSIS — E1129 Type 2 diabetes mellitus with other diabetic kidney complication: Secondary | ICD-10-CM | POA: Diagnosis not present

## 2017-01-31 DIAGNOSIS — D509 Iron deficiency anemia, unspecified: Secondary | ICD-10-CM | POA: Diagnosis not present

## 2017-01-31 DIAGNOSIS — D631 Anemia in chronic kidney disease: Secondary | ICD-10-CM | POA: Diagnosis not present

## 2017-01-31 DIAGNOSIS — N186 End stage renal disease: Secondary | ICD-10-CM | POA: Diagnosis not present

## 2017-01-31 DIAGNOSIS — N2581 Secondary hyperparathyroidism of renal origin: Secondary | ICD-10-CM | POA: Diagnosis not present

## 2017-01-31 DIAGNOSIS — Z23 Encounter for immunization: Secondary | ICD-10-CM | POA: Diagnosis not present

## 2017-02-01 ENCOUNTER — Ambulatory Visit (INDEPENDENT_AMBULATORY_CARE_PROVIDER_SITE_OTHER): Payer: Medicare Other | Admitting: *Deleted

## 2017-02-01 DIAGNOSIS — I779 Disorder of arteries and arterioles, unspecified: Secondary | ICD-10-CM | POA: Diagnosis not present

## 2017-02-01 DIAGNOSIS — I482 Chronic atrial fibrillation, unspecified: Secondary | ICD-10-CM

## 2017-02-01 DIAGNOSIS — Z5181 Encounter for therapeutic drug level monitoring: Secondary | ICD-10-CM

## 2017-02-01 LAB — POCT INR: INR: 2.2

## 2017-02-02 DIAGNOSIS — D631 Anemia in chronic kidney disease: Secondary | ICD-10-CM | POA: Diagnosis not present

## 2017-02-02 DIAGNOSIS — N2581 Secondary hyperparathyroidism of renal origin: Secondary | ICD-10-CM | POA: Diagnosis not present

## 2017-02-02 DIAGNOSIS — N186 End stage renal disease: Secondary | ICD-10-CM | POA: Diagnosis not present

## 2017-02-02 DIAGNOSIS — E1129 Type 2 diabetes mellitus with other diabetic kidney complication: Secondary | ICD-10-CM | POA: Diagnosis not present

## 2017-02-02 DIAGNOSIS — Z23 Encounter for immunization: Secondary | ICD-10-CM | POA: Diagnosis not present

## 2017-02-02 DIAGNOSIS — D509 Iron deficiency anemia, unspecified: Secondary | ICD-10-CM | POA: Diagnosis not present

## 2017-02-05 DIAGNOSIS — N2581 Secondary hyperparathyroidism of renal origin: Secondary | ICD-10-CM | POA: Diagnosis not present

## 2017-02-05 DIAGNOSIS — E1129 Type 2 diabetes mellitus with other diabetic kidney complication: Secondary | ICD-10-CM | POA: Diagnosis not present

## 2017-02-05 DIAGNOSIS — N186 End stage renal disease: Secondary | ICD-10-CM | POA: Diagnosis not present

## 2017-02-05 DIAGNOSIS — Z23 Encounter for immunization: Secondary | ICD-10-CM | POA: Diagnosis not present

## 2017-02-05 DIAGNOSIS — D631 Anemia in chronic kidney disease: Secondary | ICD-10-CM | POA: Diagnosis not present

## 2017-02-05 DIAGNOSIS — D509 Iron deficiency anemia, unspecified: Secondary | ICD-10-CM | POA: Diagnosis not present

## 2017-02-06 DIAGNOSIS — N185 Chronic kidney disease, stage 5: Secondary | ICD-10-CM | POA: Diagnosis not present

## 2017-02-06 DIAGNOSIS — F39 Unspecified mood [affective] disorder: Secondary | ICD-10-CM | POA: Diagnosis not present

## 2017-02-06 DIAGNOSIS — G309 Alzheimer's disease, unspecified: Secondary | ICD-10-CM | POA: Diagnosis not present

## 2017-02-06 DIAGNOSIS — I480 Paroxysmal atrial fibrillation: Secondary | ICD-10-CM | POA: Diagnosis not present

## 2017-02-06 DIAGNOSIS — Z6825 Body mass index (BMI) 25.0-25.9, adult: Secondary | ICD-10-CM | POA: Diagnosis not present

## 2017-02-06 DIAGNOSIS — I2510 Atherosclerotic heart disease of native coronary artery without angina pectoris: Secondary | ICD-10-CM | POA: Diagnosis not present

## 2017-02-06 DIAGNOSIS — G4710 Hypersomnia, unspecified: Secondary | ICD-10-CM | POA: Diagnosis not present

## 2017-02-06 DIAGNOSIS — I10 Essential (primary) hypertension: Secondary | ICD-10-CM | POA: Diagnosis not present

## 2017-02-06 DIAGNOSIS — E038 Other specified hypothyroidism: Secondary | ICD-10-CM | POA: Diagnosis not present

## 2017-02-06 DIAGNOSIS — E1129 Type 2 diabetes mellitus with other diabetic kidney complication: Secondary | ICD-10-CM | POA: Diagnosis not present

## 2017-02-07 DIAGNOSIS — N186 End stage renal disease: Secondary | ICD-10-CM | POA: Diagnosis not present

## 2017-02-07 DIAGNOSIS — D631 Anemia in chronic kidney disease: Secondary | ICD-10-CM | POA: Diagnosis not present

## 2017-02-07 DIAGNOSIS — Z23 Encounter for immunization: Secondary | ICD-10-CM | POA: Diagnosis not present

## 2017-02-07 DIAGNOSIS — E1129 Type 2 diabetes mellitus with other diabetic kidney complication: Secondary | ICD-10-CM | POA: Diagnosis not present

## 2017-02-07 DIAGNOSIS — D509 Iron deficiency anemia, unspecified: Secondary | ICD-10-CM | POA: Diagnosis not present

## 2017-02-07 DIAGNOSIS — N2581 Secondary hyperparathyroidism of renal origin: Secondary | ICD-10-CM | POA: Diagnosis not present

## 2017-02-09 DIAGNOSIS — Z23 Encounter for immunization: Secondary | ICD-10-CM | POA: Diagnosis not present

## 2017-02-09 DIAGNOSIS — D509 Iron deficiency anemia, unspecified: Secondary | ICD-10-CM | POA: Diagnosis not present

## 2017-02-09 DIAGNOSIS — D631 Anemia in chronic kidney disease: Secondary | ICD-10-CM | POA: Diagnosis not present

## 2017-02-09 DIAGNOSIS — N186 End stage renal disease: Secondary | ICD-10-CM | POA: Diagnosis not present

## 2017-02-09 DIAGNOSIS — N2581 Secondary hyperparathyroidism of renal origin: Secondary | ICD-10-CM | POA: Diagnosis not present

## 2017-02-09 DIAGNOSIS — E1129 Type 2 diabetes mellitus with other diabetic kidney complication: Secondary | ICD-10-CM | POA: Diagnosis not present

## 2017-02-12 DIAGNOSIS — D509 Iron deficiency anemia, unspecified: Secondary | ICD-10-CM | POA: Diagnosis not present

## 2017-02-12 DIAGNOSIS — N2581 Secondary hyperparathyroidism of renal origin: Secondary | ICD-10-CM | POA: Diagnosis not present

## 2017-02-12 DIAGNOSIS — Z23 Encounter for immunization: Secondary | ICD-10-CM | POA: Diagnosis not present

## 2017-02-12 DIAGNOSIS — N186 End stage renal disease: Secondary | ICD-10-CM | POA: Diagnosis not present

## 2017-02-12 DIAGNOSIS — D631 Anemia in chronic kidney disease: Secondary | ICD-10-CM | POA: Diagnosis not present

## 2017-02-12 DIAGNOSIS — E1129 Type 2 diabetes mellitus with other diabetic kidney complication: Secondary | ICD-10-CM | POA: Diagnosis not present

## 2017-02-13 ENCOUNTER — Ambulatory Visit (INDEPENDENT_AMBULATORY_CARE_PROVIDER_SITE_OTHER): Payer: Medicare Other | Admitting: Podiatry

## 2017-02-13 ENCOUNTER — Encounter: Payer: Self-pay | Admitting: Podiatry

## 2017-02-13 DIAGNOSIS — L97421 Non-pressure chronic ulcer of left heel and midfoot limited to breakdown of skin: Secondary | ICD-10-CM | POA: Diagnosis not present

## 2017-02-13 DIAGNOSIS — B351 Tinea unguium: Secondary | ICD-10-CM

## 2017-02-13 DIAGNOSIS — E1151 Type 2 diabetes mellitus with diabetic peripheral angiopathy without gangrene: Secondary | ICD-10-CM

## 2017-02-13 MED ORDER — SILVER SULFADIAZINE 1 % EX CREA: 1.0000 "application " | TOPICAL_CREAM | Freq: Every day | CUTANEOUS | 0 refills | Status: DC

## 2017-02-13 NOTE — Patient Instructions (Signed)
Apply the Silvadene cream to the skin ulcer on the back of left heel daily and cover with gauze Wear the heel protector on the left when sitting and standing Wear the surgical shoe on left foot when walking If you notice any sudden increase in pain, swelling, redness, fever in the ulcer site present to emergency department Apply a small amount of the Silvadene cream or antibiotic ointment to the small bleeding area on the fifth right toe after toenail trimming today  Diabetes and Foot Care Diabetes may cause you to have problems because of poor blood supply (circulation) to your feet and legs. This may cause the skin on your feet to become thinner, break easier, and heal more slowly. Your skin may become dry, and the skin may peel and crack. You may also have nerve damage in your legs and feet causing decreased feeling in them. You may not notice minor injuries to your feet that could lead to infections or more serious problems. Taking care of your feet is one of the most important things you can do for yourself. Follow these instructions at home:  Wear shoes at all times, even in the house. Do not go barefoot. Bare feet are easily injured.  Check your feet daily for blisters, cuts, and redness. If you cannot see the bottom of your feet, use a mirror or ask someone for help.  Wash your feet with warm water (do not use hot water) and mild soap. Then pat your feet and the areas between your toes until they are completely dry. Do not soak your feet as this can dry your skin.  Apply a moisturizing lotion or petroleum jelly (that does not contain alcohol and is unscented) to the skin on your feet and to dry, brittle toenails. Do not apply lotion between your toes.  Trim your toenails straight across. Do not dig under them or around the cuticle. File the edges of your nails with an emery board or nail file.  Do not cut corns or calluses or try to remove them with medicine.  Wear clean socks or  stockings every day. Make sure they are not too tight. Do not wear knee-high stockings since they may decrease blood flow to your legs.  Wear shoes that fit properly and have enough cushioning. To break in new shoes, wear them for just a few hours a day. This prevents you from injuring your feet. Always look in your shoes before you put them on to be sure there are no objects inside.  Do not cross your legs. This may decrease the blood flow to your feet.  If you find a minor scrape, cut, or break in the skin on your feet, keep it and the skin around it clean and dry. These areas may be cleansed with mild soap and water. Do not cleanse the area with peroxide, alcohol, or iodine.  When you remove an adhesive bandage, be sure not to damage the skin around it.  If you have a wound, look at it several times a day to make sure it is healing.  Do not use heating pads or hot water bottles. They may burn your skin. If you have lost feeling in your feet or legs, you may not know it is happening until it is too late.  Make sure your health care provider performs a complete foot exam at least annually or more often if you have foot problems. Report any cuts, sores, or bruises to your health care provider  immediately. Contact a health care provider if:  You have an injury that is not healing.  You have cuts or breaks in the skin.  You have an ingrown nail.  You notice redness on your legs or feet.  You feel burning or tingling in your legs or feet.  You have pain or cramps in your legs and feet.  Your legs or feet are numb.  Your feet always feel cold. Get help right away if:  There is increasing redness, swelling, or pain in or around a wound.  There is a red line that goes up your leg.  Pus is coming from a wound.  You develop a fever or as directed by your health care provider.  You notice a bad smell coming from an ulcer or wound. This information is not intended to replace advice  given to you by your health care provider. Make sure you discuss any questions you have with your health care provider. Document Released: 06/09/2000 Document Revised: 11/18/2015 Document Reviewed: 11/19/2012 Elsevier Interactive Patient Education  2017 [employer].

## 2017-02-13 NOTE — Progress Notes (Signed)
Patient ID: [name], male   DOB: 1934/05/30, 83 y.o.   MRN: [id number]

## 2017-02-13 NOTE — Progress Notes (Signed)
Patient ID: MCKOY BHAKTA, male   DOB: April 09, 1934, 83 y.o.   MRN: [id number]    Subjective: This patient presents for a scheduled visit complaining that his toenails are thickened and elongated request toenail debridement. Patient also under care and wound care center for the past 3 weeks for skin ulcer on left heel. The patient's wife describes weekly application of a debriding enzyme and dressing and patient has scheduled visit for skin ulcer today. Patient states since the visit of 11/07/2016 the posterior left heel ulcer has resolved, however, patient noticed in the last day or 2 that the eschar has sloughed and the wound may be open again Patient is a diabetic with a history of peripheral arterial disease  Objective: Patient is slow to respond DP right 2/4 DP left 1/4 PT pulses nonpalpable bilaterally Capillary reflex delayed bilaterally Gauze dressing on left ankle and heel for ongoing ulcer under treatment of wound care Patient wearing boot on rleftfoot Plantar callus fifth MPJ right HAV right Hammertoe second bilaterally Atrophic skin bilaterally Toenails are elongated brittle, deformed and tender to direct palpation Posterior left heel has scaling skin with a 3 mm ulcer superficial. The wound base appears granular. There is no surrounding erythema edema   Assessment: Diabetic with peripheral arterial disease Superficial noninfected skin ulcer posterior left heel Symptomatic mycotic toenails 6-10  Plan: Debridement toenails 6-10 mechanically analytic with slight bleeding on the fifth right toe, 2 with antibiotic ointment and Band-Aid Debrided posterior left heel ulcer apply Silvadene dressing Rx Silvadene cream to be applied daily by wife Wear existing heel protector left Discussed offloading left leg by placing pillow underneath left calf Instructed patient if they notice any sudden increased pain, swelling, redness, fever present to ED Reevaluate skin ulcer 7  days  Reappoint 3 months

## 2017-02-14 DIAGNOSIS — N186 End stage renal disease: Secondary | ICD-10-CM | POA: Diagnosis not present

## 2017-02-14 DIAGNOSIS — N2581 Secondary hyperparathyroidism of renal origin: Secondary | ICD-10-CM | POA: Diagnosis not present

## 2017-02-14 DIAGNOSIS — D631 Anemia in chronic kidney disease: Secondary | ICD-10-CM | POA: Diagnosis not present

## 2017-02-14 DIAGNOSIS — D509 Iron deficiency anemia, unspecified: Secondary | ICD-10-CM | POA: Diagnosis not present

## 2017-02-14 DIAGNOSIS — Z23 Encounter for immunization: Secondary | ICD-10-CM | POA: Diagnosis not present

## 2017-02-14 DIAGNOSIS — E1129 Type 2 diabetes mellitus with other diabetic kidney complication: Secondary | ICD-10-CM | POA: Diagnosis not present

## 2017-02-16 DIAGNOSIS — E1129 Type 2 diabetes mellitus with other diabetic kidney complication: Secondary | ICD-10-CM | POA: Diagnosis not present

## 2017-02-16 DIAGNOSIS — D631 Anemia in chronic kidney disease: Secondary | ICD-10-CM | POA: Diagnosis not present

## 2017-02-16 DIAGNOSIS — Z23 Encounter for immunization: Secondary | ICD-10-CM | POA: Diagnosis not present

## 2017-02-16 DIAGNOSIS — D509 Iron deficiency anemia, unspecified: Secondary | ICD-10-CM | POA: Diagnosis not present

## 2017-02-16 DIAGNOSIS — N2581 Secondary hyperparathyroidism of renal origin: Secondary | ICD-10-CM | POA: Diagnosis not present

## 2017-02-16 DIAGNOSIS — N186 End stage renal disease: Secondary | ICD-10-CM | POA: Diagnosis not present

## 2017-02-19 DIAGNOSIS — E1129 Type 2 diabetes mellitus with other diabetic kidney complication: Secondary | ICD-10-CM | POA: Diagnosis not present

## 2017-02-19 DIAGNOSIS — D631 Anemia in chronic kidney disease: Secondary | ICD-10-CM | POA: Diagnosis not present

## 2017-02-19 DIAGNOSIS — D509 Iron deficiency anemia, unspecified: Secondary | ICD-10-CM | POA: Diagnosis not present

## 2017-02-19 DIAGNOSIS — Z23 Encounter for immunization: Secondary | ICD-10-CM | POA: Diagnosis not present

## 2017-02-19 DIAGNOSIS — N2581 Secondary hyperparathyroidism of renal origin: Secondary | ICD-10-CM | POA: Diagnosis not present

## 2017-02-19 DIAGNOSIS — N186 End stage renal disease: Secondary | ICD-10-CM | POA: Diagnosis not present

## 2017-02-20 ENCOUNTER — Ambulatory Visit (INDEPENDENT_AMBULATORY_CARE_PROVIDER_SITE_OTHER): Payer: Medicare Other | Admitting: Podiatry

## 2017-02-20 ENCOUNTER — Encounter: Payer: Self-pay | Admitting: Podiatry

## 2017-02-20 DIAGNOSIS — L97421 Non-pressure chronic ulcer of left heel and midfoot limited to breakdown of skin: Secondary | ICD-10-CM | POA: Diagnosis not present

## 2017-02-20 NOTE — Patient Instructions (Signed)
Apply Silvadene cream to skin ulcer on the left heel daily and cover with small gauze or a small Band-Aid Continue wearing surgical shoe on the left foot Continue using the heel protector when a seated position When possible Place pillow under calf to elevate the leg off the heel You notice any sudden increased pain, swelling, redness, fever present to emergency room Diabetes and Foot Care Diabetes may cause you to have problems because of poor blood supply (circulation) to your feet and legs. This may cause the skin on your feet to become thinner, break easier, and heal more slowly. Your skin may become dry, and the skin may peel and crack. You may also have nerve damage in your legs and feet causing decreased feeling in them. You may not notice minor injuries to your feet that could lead to infections or more serious problems. Taking care of your feet is one of the most important things you can do for yourself. Follow these instructions at home:  Wear shoes at all times, even in the house. Do not go barefoot. Bare feet are easily injured.  Check your feet daily for blisters, cuts, and redness. If you cannot see the bottom of your feet, use a mirror or ask someone for help.  Wash your feet with warm water (do not use hot water) and mild soap. Then pat your feet and the areas between your toes until they are completely dry. Do not soak your feet as this can dry your skin.  Apply a moisturizing lotion or petroleum jelly (that does not contain alcohol and is unscented) to the skin on your feet and to dry, brittle toenails. Do not apply lotion between your toes.  Trim your toenails straight across. Do not dig under them or around the cuticle. File the edges of your nails with an emery board or nail file.  Do not cut corns or calluses or try to remove them with medicine.  Wear clean socks or stockings every day. Make sure they are not too tight. Do not wear knee-high stockings since they may  decrease blood flow to your legs.  Wear shoes that fit properly and have enough cushioning. To break in new shoes, wear them for just a few hours a day. This prevents you from injuring your feet. Always look in your shoes before you put them on to be sure there are no objects inside.  Do not cross your legs. This may decrease the blood flow to your feet.  If you find a minor scrape, cut, or break in the skin on your feet, keep it and the skin around it clean and dry. These areas may be cleansed with mild soap and water. Do not cleanse the area with peroxide, alcohol, or iodine.  When you remove an adhesive bandage, be sure not to damage the skin around it.  If you have a wound, look at it several times a day to make sure it is healing.  Do not use heating pads or hot water bottles. They may burn your skin. If you have lost feeling in your feet or legs, you may not know it is happening until it is too late.  Make sure your health care provider performs a complete foot exam at least annually or more often if you have foot problems. Report any cuts, sores, or bruises to your health care provider immediately. Contact a health care provider if:  You have an injury that is not healing.  You have cuts or  breaks in the skin.  You have an ingrown nail.  You notice redness on your legs or feet.  You feel burning or tingling in your legs or feet.  You have pain or cramps in your legs and feet.  Your legs or feet are numb.  Your feet always feel cold. Get help right away if:  There is increasing redness, swelling, or pain in or around a wound.  There is a red line that goes up your leg.  Pus is coming from a wound.  You develop a fever or as directed by your health care provider.  You notice a bad smell coming from an ulcer or wound. This information is not intended to replace advice given to you by your health care provider. Make sure you discuss any questions you have with your  health care provider. Document Released: 06/09/2000 Document Revised: 11/18/2015 Document Reviewed: 11/19/2012 Elsevier Interactive Patient Education  2017 [employer].

## 2017-02-20 NOTE — Progress Notes (Signed)
Patient ID: [name], male   DOB: 1933-12-24, 83 y.o.   MRN: [id number]    Subjective: Patient presents for follow-up care for posterior left heel ulcer from the visit of 02/13/2017 patient had previous history of a skin ulcer which was treated at the wound care center  Objective: Patient is slow to respond DP right 2/4 DP left 1/4 PT pulses nonpalpable bilaterally Capillary reflex delayed bilaterally Gauze dressing on left ankle and heel for ongoing ulcer under treatment of wound care Patient wearing boot on rleftfoot Plantar callus fifth MPJ right HAV right Hammertoe second bilaterally Atrophic skin bilaterally Toenails are elongated brittle, deformed and tender to direct palpation Posterior left heel has scaling skin with a 3 x 1 mm ulcer superficial. The wound base appears granular. There is no surrounding erythema edema   Assessment: Diabetic with peripheral arterial disease Superficial noninfected skin ulcer posterior left heel that appears stable   Plan:  Debrided posterior left heel ulcer apply Silvadene dressing Rx Silvadene cream to be applied daily by wife Wear existing heel protector left Discussed offloading left leg by placing pillow underneath left calf Instructed patient if they notice any sudden increased pain, swelling, redness, fever present to ED  Reevaluate skin ulcer  3 weeks

## 2017-02-21 DIAGNOSIS — N2581 Secondary hyperparathyroidism of renal origin: Secondary | ICD-10-CM | POA: Diagnosis not present

## 2017-02-21 DIAGNOSIS — N186 End stage renal disease: Secondary | ICD-10-CM | POA: Diagnosis not present

## 2017-02-21 DIAGNOSIS — D631 Anemia in chronic kidney disease: Secondary | ICD-10-CM | POA: Diagnosis not present

## 2017-02-21 DIAGNOSIS — D509 Iron deficiency anemia, unspecified: Secondary | ICD-10-CM | POA: Diagnosis not present

## 2017-02-21 DIAGNOSIS — E1129 Type 2 diabetes mellitus with other diabetic kidney complication: Secondary | ICD-10-CM | POA: Diagnosis not present

## 2017-02-21 DIAGNOSIS — Z23 Encounter for immunization: Secondary | ICD-10-CM | POA: Diagnosis not present

## 2017-02-23 DIAGNOSIS — D631 Anemia in chronic kidney disease: Secondary | ICD-10-CM | POA: Diagnosis not present

## 2017-02-23 DIAGNOSIS — N2581 Secondary hyperparathyroidism of renal origin: Secondary | ICD-10-CM | POA: Diagnosis not present

## 2017-02-23 DIAGNOSIS — Z23 Encounter for immunization: Secondary | ICD-10-CM | POA: Diagnosis not present

## 2017-02-23 DIAGNOSIS — Z992 Dependence on renal dialysis: Secondary | ICD-10-CM | POA: Diagnosis not present

## 2017-02-23 DIAGNOSIS — D509 Iron deficiency anemia, unspecified: Secondary | ICD-10-CM | POA: Diagnosis not present

## 2017-02-23 DIAGNOSIS — N186 End stage renal disease: Secondary | ICD-10-CM | POA: Diagnosis not present

## 2017-02-23 DIAGNOSIS — E1122 Type 2 diabetes mellitus with diabetic chronic kidney disease: Secondary | ICD-10-CM | POA: Diagnosis not present

## 2017-02-23 DIAGNOSIS — E1129 Type 2 diabetes mellitus with other diabetic kidney complication: Secondary | ICD-10-CM | POA: Diagnosis not present

## 2017-02-26 DIAGNOSIS — E1129 Type 2 diabetes mellitus with other diabetic kidney complication: Secondary | ICD-10-CM | POA: Diagnosis not present

## 2017-02-26 DIAGNOSIS — N2581 Secondary hyperparathyroidism of renal origin: Secondary | ICD-10-CM | POA: Diagnosis not present

## 2017-02-26 DIAGNOSIS — N186 End stage renal disease: Secondary | ICD-10-CM | POA: Diagnosis not present

## 2017-02-26 DIAGNOSIS — R319 Hematuria, unspecified: Secondary | ICD-10-CM | POA: Diagnosis not present

## 2017-02-26 DIAGNOSIS — D509 Iron deficiency anemia, unspecified: Secondary | ICD-10-CM | POA: Diagnosis not present

## 2017-02-28 DIAGNOSIS — N2581 Secondary hyperparathyroidism of renal origin: Secondary | ICD-10-CM | POA: Diagnosis not present

## 2017-02-28 DIAGNOSIS — E1129 Type 2 diabetes mellitus with other diabetic kidney complication: Secondary | ICD-10-CM | POA: Diagnosis not present

## 2017-02-28 DIAGNOSIS — N186 End stage renal disease: Secondary | ICD-10-CM | POA: Diagnosis not present

## 2017-02-28 DIAGNOSIS — D509 Iron deficiency anemia, unspecified: Secondary | ICD-10-CM | POA: Diagnosis not present

## 2017-02-28 DIAGNOSIS — R319 Hematuria, unspecified: Secondary | ICD-10-CM | POA: Diagnosis not present

## 2017-03-01 ENCOUNTER — Ambulatory Visit (INDEPENDENT_AMBULATORY_CARE_PROVIDER_SITE_OTHER): Payer: Medicare Other | Admitting: *Deleted

## 2017-03-01 DIAGNOSIS — Z5181 Encounter for therapeutic drug level monitoring: Secondary | ICD-10-CM

## 2017-03-01 DIAGNOSIS — I482 Chronic atrial fibrillation, unspecified: Secondary | ICD-10-CM

## 2017-03-01 LAB — POCT INR: INR: 2

## 2017-03-02 DIAGNOSIS — N2581 Secondary hyperparathyroidism of renal origin: Secondary | ICD-10-CM | POA: Diagnosis not present

## 2017-03-02 DIAGNOSIS — R319 Hematuria, unspecified: Secondary | ICD-10-CM | POA: Diagnosis not present

## 2017-03-02 DIAGNOSIS — E1129 Type 2 diabetes mellitus with other diabetic kidney complication: Secondary | ICD-10-CM | POA: Diagnosis not present

## 2017-03-02 DIAGNOSIS — N186 End stage renal disease: Secondary | ICD-10-CM | POA: Diagnosis not present

## 2017-03-02 DIAGNOSIS — D509 Iron deficiency anemia, unspecified: Secondary | ICD-10-CM | POA: Diagnosis not present

## 2017-03-05 DIAGNOSIS — N2581 Secondary hyperparathyroidism of renal origin: Secondary | ICD-10-CM | POA: Diagnosis not present

## 2017-03-05 DIAGNOSIS — N186 End stage renal disease: Secondary | ICD-10-CM | POA: Diagnosis not present

## 2017-03-05 DIAGNOSIS — R319 Hematuria, unspecified: Secondary | ICD-10-CM | POA: Diagnosis not present

## 2017-03-05 DIAGNOSIS — D509 Iron deficiency anemia, unspecified: Secondary | ICD-10-CM | POA: Diagnosis not present

## 2017-03-05 DIAGNOSIS — E1129 Type 2 diabetes mellitus with other diabetic kidney complication: Secondary | ICD-10-CM | POA: Diagnosis not present

## 2017-03-07 DIAGNOSIS — E1129 Type 2 diabetes mellitus with other diabetic kidney complication: Secondary | ICD-10-CM | POA: Diagnosis not present

## 2017-03-07 DIAGNOSIS — N2581 Secondary hyperparathyroidism of renal origin: Secondary | ICD-10-CM | POA: Diagnosis not present

## 2017-03-07 DIAGNOSIS — D509 Iron deficiency anemia, unspecified: Secondary | ICD-10-CM | POA: Diagnosis not present

## 2017-03-07 DIAGNOSIS — N186 End stage renal disease: Secondary | ICD-10-CM | POA: Diagnosis not present

## 2017-03-07 DIAGNOSIS — R319 Hematuria, unspecified: Secondary | ICD-10-CM | POA: Diagnosis not present

## 2017-03-09 DIAGNOSIS — R319 Hematuria, unspecified: Secondary | ICD-10-CM | POA: Diagnosis not present

## 2017-03-09 DIAGNOSIS — N2581 Secondary hyperparathyroidism of renal origin: Secondary | ICD-10-CM | POA: Diagnosis not present

## 2017-03-09 DIAGNOSIS — E1129 Type 2 diabetes mellitus with other diabetic kidney complication: Secondary | ICD-10-CM | POA: Diagnosis not present

## 2017-03-09 DIAGNOSIS — D509 Iron deficiency anemia, unspecified: Secondary | ICD-10-CM | POA: Diagnosis not present

## 2017-03-09 DIAGNOSIS — N186 End stage renal disease: Secondary | ICD-10-CM | POA: Diagnosis not present

## 2017-03-12 DIAGNOSIS — D509 Iron deficiency anemia, unspecified: Secondary | ICD-10-CM | POA: Diagnosis not present

## 2017-03-12 DIAGNOSIS — R319 Hematuria, unspecified: Secondary | ICD-10-CM | POA: Diagnosis not present

## 2017-03-12 DIAGNOSIS — E1129 Type 2 diabetes mellitus with other diabetic kidney complication: Secondary | ICD-10-CM | POA: Diagnosis not present

## 2017-03-12 DIAGNOSIS — N2581 Secondary hyperparathyroidism of renal origin: Secondary | ICD-10-CM | POA: Diagnosis not present

## 2017-03-12 DIAGNOSIS — N186 End stage renal disease: Secondary | ICD-10-CM | POA: Diagnosis not present

## 2017-03-13 ENCOUNTER — Ambulatory Visit (INDEPENDENT_AMBULATORY_CARE_PROVIDER_SITE_OTHER)
Admission: RE | Admit: 2017-03-13 | Discharge: 2017-03-13 | Disposition: A | Discharge status: Home / Self Care | Payer: Medicare Other | Source: Ambulatory Visit | Attending: Vascular Surgery | Admitting: Vascular Surgery

## 2017-03-13 ENCOUNTER — Encounter: Payer: Self-pay | Admitting: Family

## 2017-03-13 ENCOUNTER — Encounter: Payer: Self-pay | Admitting: Podiatry

## 2017-03-13 ENCOUNTER — Ambulatory Visit (INDEPENDENT_AMBULATORY_CARE_PROVIDER_SITE_OTHER): Payer: Medicare Other | Admitting: Family

## 2017-03-13 ENCOUNTER — Ambulatory Visit (INDEPENDENT_AMBULATORY_CARE_PROVIDER_SITE_OTHER): Payer: Medicare Other | Admitting: Podiatry

## 2017-03-13 ENCOUNTER — Ambulatory Visit (HOSPITAL_COMMUNITY)
Admission: RE | Admit: 2017-03-13 | Discharge: 2017-03-13 | Disposition: A | Discharge status: Home / Self Care | Payer: Medicare Other | Source: Ambulatory Visit | Attending: Vascular Surgery | Admitting: Vascular Surgery

## 2017-03-13 VITALS — BP 176/79 | HR 64 | Temp 98.0°F | Ht 70.0 in | Wt 167.0 lb

## 2017-03-13 VITALS — BP 96/49 | HR 62

## 2017-03-13 DIAGNOSIS — Z992 Dependence on renal dialysis: Secondary | ICD-10-CM

## 2017-03-13 DIAGNOSIS — I6523 Occlusion and stenosis of bilateral carotid arteries: Secondary | ICD-10-CM

## 2017-03-13 DIAGNOSIS — I770 Arteriovenous fistula, acquired: Secondary | ICD-10-CM | POA: Diagnosis not present

## 2017-03-13 DIAGNOSIS — N186 End stage renal disease: Secondary | ICD-10-CM

## 2017-03-13 DIAGNOSIS — I739 Peripheral vascular disease, unspecified: Secondary | ICD-10-CM

## 2017-03-13 DIAGNOSIS — Z9889 Other specified postprocedural states: Secondary | ICD-10-CM

## 2017-03-13 DIAGNOSIS — L97421 Non-pressure chronic ulcer of left heel and midfoot limited to breakdown of skin: Secondary | ICD-10-CM

## 2017-03-13 DIAGNOSIS — I779 Disorder of arteries and arterioles, unspecified: Secondary | ICD-10-CM

## 2017-03-13 LAB — VAS US CAROTID
LCCADDIAS: 19 cm/s
LCCADSYS: 88 cm/s
LEFT VERTEBRAL DIAS: 15 cm/s
LICADSYS: -71 cm/s
Left CCA prox dias: 16 cm/s
Left CCA prox sys: 61 cm/s
Left ICA dist dias: -15 cm/s
Left ICA prox dias: -14 cm/s
Left ICA prox sys: -105 cm/s
RIGHT CCA MID DIAS: 11 cm/s
RIGHT ECA DIAS: 0 cm/s
Right CCA prox dias: 13 cm/s
Right CCA prox sys: 104 cm/s
Right cca dist sys: -101 cm/s

## 2017-03-13 NOTE — Progress Notes (Signed)
Patient ID: [name], male   DOB: 1934-01-20, 83 y.o.   MRN: [id number]    Subjective: Patient presents for follow-up care for posterior left heel ulcer from the visit of 02/13/2017 patient had previous history of a skin ulcer which was treated at the wound care center  Objective: Patient is slow to respond DP right 2/4 DP left 1/4 PT pulses nonpalpable bilaterally Capillary reflex delayed bilaterally Gauze dressing on left ankle and heel for ongoing ulcer under treatment of wound care Patient wearing boot on rleftfoot Plantar callus fifth MPJ right HAV right Hammertoe second bilaterally Atrophic skin bilaterally Toenails are elongated brittle, deformed and tender to direct palpation Posterior left heel has scaling skin with a 3  mm hyperkeratotic macerated area that remains closed after debridement.  There is no surrounding erythema ,edema, drainage or warmth   Assessment: Diabetic with peripheral arterial disease Area ulcerative callus posterior left heel with fragile closure  Plan: Debridement of ulcer site DC Silvadene Apply Vaseline and Band-Aid Instructed patient to wear heel protector and/or elevate leg on pillows to offload heel  Reappoint 4 weeks

## 2017-03-13 NOTE — Patient Instructions (Addendum)
Purchase a heel protector for the left heel This is foam and Velcro It is designed to wear only in a seated position not designed to walk or stand Discontinue the Silvadene cream Apply a small amount of Vaseline to the previous ulcerated area on the left heel and cover with a Band-Aid Please keep the left leg propped up on a pillow is much as possible when in a seated position Gean Birchwood DPM  Diabetes and Foot Care Diabetes may cause you to have problems because of poor blood supply (circulation) to your feet and legs. This may cause the skin on your feet to become thinner, break easier, and heal more slowly. Your skin may become dry, and the skin may peel and crack. You may also have nerve damage in your legs and feet causing decreased feeling in them. You may not notice minor injuries to your feet that could lead to infections or more serious problems. Taking care of your feet is one of the most important things you can do for yourself. Follow these instructions at home:  Wear shoes at all times, even in the house. Do not go barefoot. Bare feet are easily injured.  Check your feet daily for blisters, cuts, and redness. If you cannot see the bottom of your feet, use a mirror or ask someone for help.  Wash your feet with warm water (do not use hot water) and mild soap. Then pat your feet and the areas between your toes until they are completely dry. Do not soak your feet as this can dry your skin.  Apply a moisturizing lotion or petroleum jelly (that does not contain alcohol and is unscented) to the skin on your feet and to dry, brittle toenails. Do not apply lotion between your toes.  Trim your toenails straight across. Do not dig under them or around the cuticle. File the edges of your nails with an emery board or nail file.  Do not cut corns or calluses or try to remove them with medicine.  Wear clean socks or stockings every day. Make sure they are not too tight. Do not wear  knee-high stockings since they may decrease blood flow to your legs.  Wear shoes that fit properly and have enough cushioning. To break in new shoes, wear them for just a few hours a day. This prevents you from injuring your feet. Always look in your shoes before you put them on to be sure there are no objects inside.  Do not cross your legs. This may decrease the blood flow to your feet.  If you find a minor scrape, cut, or break in the skin on your feet, keep it and the skin around it clean and dry. These areas may be cleansed with mild soap and water. Do not cleanse the area with peroxide, alcohol, or iodine.  When you remove an adhesive bandage, be sure not to damage the skin around it.  If you have a wound, look at it several times a day to make sure it is healing.  Do not use heating pads or hot water bottles. They may burn your skin. If you have lost feeling in your feet or legs, you may not know it is happening until it is too late.  Make sure your health care provider performs a complete foot exam at least annually or more often if you have foot problems. Report any cuts, sores, or bruises to your health care provider immediately. Contact a health care provider if:  You have an injury that is not healing.  You have cuts or breaks in the skin.  You have an ingrown nail.  You notice redness on your legs or feet.  You feel burning or tingling in your legs or feet.  You have pain or cramps in your legs and feet.  Your legs or feet are numb.  Your feet always feel cold. Get help right away if:  There is increasing redness, swelling, or pain in or around a wound.  There is a red line that goes up your leg.  Pus is coming from a wound.  You develop a fever or as directed by your health care provider.  You notice a bad smell coming from an ulcer or wound. This information is not intended to replace advice given to you by your health care provider. Make sure you discuss any  questions you have with your health care provider. Document Released: 06/09/2000 Document Revised: 11/18/2015 Document Reviewed: 11/19/2012 Elsevier Interactive Patient Education  2017 [employer].

## 2017-03-13 NOTE — Progress Notes (Signed)
[facility] [place] HISTORY AND PHYSICAL  CC: Follow up extracranial carotid artery stenosis and peripheral artery occlusive disease   History of Present Illness:   [name] is a 83 y.o. male patient of Dr. Scot Dock who is s/p left brachiocephalic AV fistula creation on 01/21/13. He is also s/p Right carotid endarterectomy 01/29/2001; Left common carotid artery stenosis repair with interposition graft 12/30/1999; Left carotid patch angioplasty 12/01/1998; Left carotid endarterectomy 04/30/1989. He returns today for carotid artery stenosis follow up.   He denies any history of claudication, although his activity seems fairly limited. He denies any history of rest pain. He is non smoker.  He remains asymptomatic from a carotid standpoint. He denies any tingling, numbness, or aching in either hand or arm.  He denies any history of stroke or TIA. Specifically he denies any history of hemiparesis, denies symptoms of aphasia, denies monocular vision loss, denies unilateral facial drooping.  He was hospitalized at [facility] in March 2017 for fluid overload, started HD then. He was in rehab after this and developed a left heel decubitus ulcer during his stay there.His podiatrist, Dr. Amalia Hailey, is treating his left heel ulcer.    Wife states he ambulates with a walker at home. She states he is also unsteady on his feet. He has a history of shingles on his right leg.  He was started on Namenda.   He had a cardioversion early September, 2015.  Pt Diabetic: Yes, 6wife states last A1C was 5.?, in good control  Pt smoker: non-smoker  Pt meds include: Statin : Yes ASA: No Other anticoagulants/antiplatelets: coumadin for atrial fib      Current Outpatient Prescriptions  Medication Sig Dispense Refill  . acetaminophen (TYLENOL) 325 MG tablet Take 2 tablets (650 mg total) by mouth every 4 (four) hours as needed for headache or mild pain.    Marland Kitchen albuterol (PROVENTIL  HFA;VENTOLIN HFA) 108 (90 Base) MCG/ACT inhaler Inhale 2 puffs into the lungs every 4 (four) hours as needed for wheezing or shortness of breath. 1 Inhaler 0  . atorvastatin (LIPITOR) 80 MG tablet TAKE ONE TABLET BY MOUTH ONCE DAILY 90 tablet 3  . calcium acetate (PHOSLO) 667 MG capsule Take 1,334 mg by mouth 3 (three) times daily with meals.    . cholecalciferol (VITAMIN D) 1000 UNITS tablet Take 1,000 Units by mouth daily at 6 PM. D3    . divalproex (DEPAKOTE SPRINKLE) 125 MG capsule Take 125 mg by mouth daily.     . insulin aspart (NOVOLOG FLEXPEN) 100 UNIT/ML FlexPen Inject 8 Units into the skin 2 (two) times daily before a meal.    . Insulin Glargine (LANTUS SOLOSTAR) 100 UNIT/ML SOPN Inject 24 Units into the skin every morning.     Marland Kitchen levothyroxine (SYNTHROID, LEVOTHROID) 175 MCG tablet Take 175 mcg by mouth daily before breakfast.    . memantine (NAMENDA) 5 MG tablet Take 5 mg by mouth daily.     . Multiple Vitamins-Minerals (DECUBI-VITE) CAPS Take 1 capsule by mouth daily.    . multivitamin (RENA-VIT) TABS tablet Take 1 tablet by mouth daily.    . mupirocin ointment (BACTROBAN) 2 % Place 1 application into the nose 2 (two) times daily. 22 g 0  . nitroGLYCERIN (NITROSTAT) 0.4 MG SL tablet Place 1 tablet (0.4 mg total) under the tongue every 5 (five) minutes as needed for chest pain. 30 tablet 12  . polyethylene glycol (MIRALAX / GLYCOLAX) packet Take 17 g by mouth daily as needed  for mild constipation.    . ranitidine (ZANTAC) 300 MG tablet Take 300 mg by mouth daily.    . sevelamer carbonate (RENVELA) 2.4 g PACK Take 2.4 g by mouth 3 (three) times daily. 90 each   . silver sulfADIAZINE (SILVADENE) 1 % cream Apply 1 application topically daily. 50 g 0  . warfarin (COUMADIN) 5 MG tablet USE AS DIRECTED 110 tablet 1  . warfarin (COUMADIN) 5 MG tablet Take 5-7.5 mg by mouth See admin instructions. Take 1 1/2 (7.5mg ) tablet on M, W, F, and 1 tablet (5mg ) on Sun, T, Th, Sat     No current  facility-administered medications for this visit.     Past Medical History:  Diagnosis Date  . Atrial fibrillation ([place]) Aug. 2015   a. Dx 01/2014, s/p DCCV 03/06/14.  Marland Kitchen CAD (coronary artery disease)    a. prior CABG in 1997 with redo in 2000. b. last cath in 2008 - managed medically  . Carotid disease, bilateral ([place])    with multiple bilateral carotid surgeries  . Chronic diastolic CHF (congestive heart failure) ([place])   . Chronic respiratory failure with hypoxia ([place])   . CKD (chronic kidney disease), stage IV ([place])    a. Has fistula in place.   . Degenerative joint disease   . Dementia   . Generalized weakness    without overt findings  . GERD (gastroesophageal reflux disease)   . Gout   . Heart murmur   . Hyperlipidemia   . Hypertension   . Hypothyroidism   . Meniere's disease   . On home oxygen therapy    "2L" qhs (09/09/2015  . Orthopnea    Two-pillow  . Peripheral vascular disease ([place])   . Psoriasis   . Shingles   . Sinus bradycardia    a. Toprol D/C'd due to this.   . Type 2 diabetes mellitus ([place])     Social History Social History  Substance Use Topics  . Smoking status: Never Smoker  . Smokeless tobacco: Never Used  . Alcohol use No    Family History Family History  Problem Relation Age of Onset  . Heart attack Father   . Heart disease Father        After 86 yrs of age  . Hyperlipidemia Father   . Hypertension Father   . Diabetes Mother   . Hypertension Mother   . Heart disease Mother   . Heart disease Brother   . Diabetes Brother     Surgical History Past Surgical History:  Procedure Laterality Date  . APPENDECTOMY    . AV FISTULA PLACEMENT Left 01/21/2013   Procedure: ARTERIOVENOUS (AV) FISTULA CREATION, Left Brachiocephalic;  Surgeon: Angelia Mould, MD;  Location: [facility];  Service: Vascular;  Laterality: Left;  . BLEPHAROPLASTY Bilateral   . CARDIAC CATHETERIZATION    . CARDIAC CATHETERIZATION  2008   L main irreg, LAD 80%,  IMA-LAD & SVG-Diag patent, CFX 100%, SVG-OM 90%, RCA 70%, SVG-RCA OK, EF nl, med rx, no vessels appropriate for PCI  . CARDIAC CATHETERIZATION N/A 01/12/2016   Procedure: Left Heart Cath and Cors/Grafts Angiography;  Surgeon: Belva Crome, MD;  Location: [place] [facility];  Service: Cardiovascular;  Laterality: N/A;  . CARDIOVERSION N/A 03/06/2014   Procedure: CARDIOVERSION;  Surgeon: Larey Dresser, MD;  Location: [facility];  Service: Cardiovascular;  Laterality: N/A;  . CAROTID ENDARTERECTOMY Bilateral "several times"   "5 on right; 2-3 on left" (09/09/2015)  . CORONARY ARTERY BYPASS  GRAFT  1997;  2002  . FRACTURE SURGERY    . LUMBAR LAMINECTOMY  July 2001  . LUNG REMOVAL, PARTIAL    . SHOULDER SURGERY Bilateral   . TEE WITHOUT CARDIOVERSION N/A 03/06/2014   Procedure: TRANSESOPHAGEAL ECHOCARDIOGRAM (TEE);  Surgeon: Larey Dresser, MD;  Location: [facility];  Service: Cardiovascular;  Laterality: N/A;  . THORACOTOMY Left    due to fungal infection    Allergies  Allergen Reactions  . Betadine [Povidone Iodine] Rash  . Iodinated Diagnostic Agents Rash and Other (See Comments)    Does fine with premedications for cath  . Latex Hives  . Tape Rash    MUST BE FREE OF ANY LATEX    Current Outpatient Prescriptions  Medication Sig Dispense Refill  . acetaminophen (TYLENOL) 325 MG tablet Take 2 tablets (650 mg total) by mouth every 4 (four) hours as needed for headache or mild pain.    Marland Kitchen albuterol (PROVENTIL HFA;VENTOLIN HFA) 108 (90 Base) MCG/ACT inhaler Inhale 2 puffs into the lungs every 4 (four) hours as needed for wheezing or shortness of breath. 1 Inhaler 0  . atorvastatin (LIPITOR) 80 MG tablet TAKE ONE TABLET BY MOUTH ONCE DAILY 90 tablet 3  . calcium acetate (PHOSLO) 667 MG capsule Take 1,334 mg by mouth 3 (three) times daily with meals.    . cholecalciferol (VITAMIN D) 1000 UNITS tablet Take 1,000 Units by mouth daily at 6 PM. D3    . divalproex (DEPAKOTE SPRINKLE) 125 MG  capsule Take 125 mg by mouth daily.     . insulin aspart (NOVOLOG FLEXPEN) 100 UNIT/ML FlexPen Inject 8 Units into the skin 2 (two) times daily before a meal.    . Insulin Glargine (LANTUS SOLOSTAR) 100 UNIT/ML SOPN Inject 24 Units into the skin every morning.     Marland Kitchen levothyroxine (SYNTHROID, LEVOTHROID) 175 MCG tablet Take 175 mcg by mouth daily before breakfast.    . memantine (NAMENDA) 5 MG tablet Take 5 mg by mouth daily.     . Multiple Vitamins-Minerals (DECUBI-VITE) CAPS Take 1 capsule by mouth daily.    . multivitamin (RENA-VIT) TABS tablet Take 1 tablet by mouth daily.    . mupirocin ointment (BACTROBAN) 2 % Place 1 application into the nose 2 (two) times daily. 22 g 0  . nitroGLYCERIN (NITROSTAT) 0.4 MG SL tablet Place 1 tablet (0.4 mg total) under the tongue every 5 (five) minutes as needed for chest pain. 30 tablet 12  . polyethylene glycol (MIRALAX / GLYCOLAX) packet Take 17 g by mouth daily as needed for mild constipation.    . ranitidine (ZANTAC) 300 MG tablet Take 300 mg by mouth daily.    . sevelamer carbonate (RENVELA) 2.4 g PACK Take 2.4 g by mouth 3 (three) times daily. 90 each   . silver sulfADIAZINE (SILVADENE) 1 % cream Apply 1 application topically daily. 50 g 0  . warfarin (COUMADIN) 5 MG tablet USE AS DIRECTED 110 tablet 1  . warfarin (COUMADIN) 5 MG tablet Take 5-7.5 mg by mouth See admin instructions. Take 1 1/2 (7.5mg ) tablet on M, W, F, and 1 tablet (5mg ) on Sun, T, Th, Sat     No current facility-administered medications for this visit.      REVIEW OF SYSTEMS: See HPI for pertinent positives and negatives.  Physical Examination Vitals:   03/13/17 1422 03/13/17 1426  BP: (!) 182/65 (!) 176/79  Pulse: 64   Temp: 98 F (36.7 C)   TempSrc: Oral   SpO2: 98%  Weight: 167 lb (75.8 kg)   Height: 5\' 10"  (1.778 m)    Body mass index is 23.96 kg/m.  General: WDWN male in NAD, with wife GAIT: using walker, slow, deliberate.  Eyes: PERRLA Pulmonary:  Respirations are non-labored, CTAB Cardiac: regular rhythm and rate,+ murmur.  VASCULAR EXAM Carotid Bruits Right Left   Positive Positive    Abdominal aortic pulse is not palpable. Radial pulses: right is 2+ palpable, left is 1+ palpable.Left upper arm AVF has a palpable thrill, is moderately aneurysmal.      LE Pulses Right Left   FEMORAL 2+ palpable 2+ palpable    POPLITEAL not palpable  not palpable   POSTERIOR TIBIAL not palpable  not palpable   DORSALIS PEDIS  ANTERIOR TIBIAL not palpable  1+ palpable    Gastrointestinal: soft, nontender, BS WNL, no r/g, small reducible ventral hernia.  Musculoskeletal: Generalized muscle atrophy/wasting/deconditioning. M/S 4/5 throughout. Moderate kyphosis.  Dressing on left heel ulcer, post op shoe on.   Skin: No rashes.   Neurologic: A&O X 2; appropriate affect,speech is normal, CN 2-12 intact, Pain and light touch intact in extremities, Motor exam as listed above    ASSESSMENT:  [name] is a 83 y.o. male who is s/p left brachiocephalic AV fistula on 5/95/63. He is also s/p Right carotid endarterectomy 01/29/2001; Left common carotid artery stenosis repair with interposition graft 12/30/1999; Left carotid patch angioplasty 12/01/1998; Left carotid endarterectomy 04/30/1989.  He was having some work done by his podiatrist who was concerned about his circulation.  Femoral pulses are 2+ palpable. Pedal pulses are Dopplerable but not palpable. Feet and toes are pink and warm.  He denies any history of claudication, although his activity seems fairly limited. He denies any history of rest pain other than the healing decubitus ulcer on his left heel that he developed while in rehab in March 2017. Wife states pt's left heel ulcer is  healing slowly. His podiatrist, Dr. Amalia Hailey, is treating his left heel ulcer.     He is non smoker.   He started HD in his left upper arm AV fistula in March of 2017 when hospitalized for fluid overload. He dialyzes M-W-F.   He has no history of stroke or TIA. He denies any tingling, numbness, or aching in either hand or arm.   DATA  Carotid Duplex (03-13-17):  60-79% right internal carotid artery stenosis.   1-39% left internal carotid artery stenosis. Known left external carotid artery occlusion. Bilateral vertebral artery flow is antegrade. Bilateral subclavian artery waveforms are monophasic.  No significant change from the previous carotid duplex on 06/02/15, 11/25/14, and 09/05/16.  ABI (Date: 03/13/2017):  R:   ABI: 0.80 (was 0.73 on 09-05-16),   PT: mono (was mono)  DP: mono (was mono)  TBI:  0.28 (was 1.08)  L:   ABI: 0.66 (was 0.90),   PT: mono (was mono)  DP: mono (was mono)  TBI: 0.33 (was 0.63) ABI's again do not correspond with monophasic waveforms throughout. TBI's have declined bilaterally.   PLAN:   Based on today's exam and non-invasive vascular lab results, the patient will follow up in 6 months with the following tests: carotid duplex and ABI's. I advised him and his wife to notify us if he develops concerns re the circulation in his feet or legs.  Daily seated leg exercises reviewed with pt and wife.   I discussed in depth with the patient the nature of atherosclerosis, and emphasized the importance of maximal medical  management including strict control of blood pressure, blood glucose, and lipid levels, obtaining regular exercise, and cessation of smoking.  The patient is aware that without maximal medical management the underlying atherosclerotic disease process will progress, limiting the benefit of any interventions.  The patient was given information about stroke prevention and what symptoms should prompt the patient to seek immediate medical  care.  The patient was given information about PAD including signs, symptoms, treatment, what symptoms should prompt the patient to seek immediate medical care, and risk reduction measures to take.  Thank you for allowing [place] to participate in this patient's care.  Clemon Chambers, RN, MSN, FNP-C [facility] Office: [phone]  Clinic MD: Early 03/13/2017 2:35 PM

## 2017-03-13 NOTE — Patient Instructions (Signed)
Stroke Prevention Some medical conditions and behaviors are associated with an increased chance of having a stroke. You may prevent a stroke by making healthy choices and managing medical conditions. How can I reduce my risk of having a stroke?  Stay physically active. Get at least 30 minutes of activity on most or all days.  Do not smoke. It may also be helpful to avoid exposure to secondhand smoke.  Limit alcohol use. Moderate alcohol use is considered to be:  No more than 2 drinks per day for men.  No more than 1 drink per day for nonpregnant women.  Eat healthy foods. This involves:  Eating 5 or more servings of fruits and vegetables a day.  Making dietary changes that address high blood pressure (hypertension), high cholesterol, diabetes, or obesity.  Manage your cholesterol levels.  Making food choices that are high in fiber and low in saturated fat, trans fat, and cholesterol may control cholesterol levels.  Take any prescribed medicines to control cholesterol as directed by your health care provider.  Manage your diabetes.  Controlling your carbohydrate and sugar intake is recommended to manage diabetes.  Take any prescribed medicines to control diabetes as directed by your health care provider.  Control your hypertension.  Making food choices that are low in salt (sodium), saturated fat, trans fat, and cholesterol is recommended to manage hypertension.  Ask your health care provider if you need treatment to lower your blood pressure. Take any prescribed medicines to control hypertension as directed by your health care provider.  If you are 18-39 years of age, have your blood pressure checked every 3-5 years. If you are 40 years of age or older, have your blood pressure checked every year.  Maintain a healthy weight.  Reducing calorie intake and making food choices that are low in sodium, saturated fat, trans fat, and cholesterol are recommended to manage  weight.  Stop drug abuse.  Avoid taking birth control pills.  Talk to your health care provider about the risks of taking birth control pills if you are over 35 years old, smoke, get migraines, or have ever had a blood clot.  Get evaluated for sleep disorders (sleep apnea).  Talk to your health care provider about getting a sleep evaluation if you snore a lot or have excessive sleepiness.  Take medicines only as directed by your health care provider.  For some people, aspirin or blood thinners (anticoagulants) are helpful in reducing the risk of forming abnormal blood clots that can lead to stroke. If you have the irregular heart rhythm of atrial fibrillation, you should be on a blood thinner unless there is a good reason you cannot take them.  Understand all your medicine instructions.  Make sure that other conditions (such as anemia or atherosclerosis) are addressed. Get help right away if:  You have sudden weakness or numbness of the face, arm, or leg, especially on one side of the body.  Your face or eyelid droops to one side.  You have sudden confusion.  You have trouble speaking (aphasia) or understanding.  You have sudden trouble seeing in one or both eyes.  You have sudden trouble walking.  You have dizziness.  You have a loss of balance or coordination.  You have a sudden, severe headache with no known cause.  You have new chest pain or an irregular heartbeat. Any of these symptoms may represent a serious problem that is an emergency. Do not wait to see if the symptoms will go away.   Get medical help at once. Call your local emergency services (911 in [place] Do not drive yourself to the hospital.  This information is not intended to replace advice given to you by your health care provider. Make sure you discuss any questions you have with your health care provider. Document Released: 07/20/2004 Document Revised: 11/18/2015 Document Reviewed: 12/13/2012 Elsevier  Interactive Patient Education  2017 Elsevier Inc.      Peripheral Vascular Disease Peripheral vascular disease (PVD) is a disease of the blood vessels that are not part of your heart and brain. A simple term for PVD is poor circulation. In most cases, PVD narrows the blood vessels that carry blood from your heart to the rest of your body. This can result in a decreased supply of blood to your arms, legs, and internal organs, like your stomach or kidneys. However, it most often affects a person's lower legs and feet. There are two types of PVD.  Organic PVD. This is the more common type. It is caused by damage to the structure of blood vessels.  Functional PVD. This is caused by conditions that make blood vessels contract and tighten (spasm). Without treatment, PVD tends to get worse over time. PVD can also lead to acute ischemic limb. This is when an arm or limb suddenly has trouble getting enough blood. This is a medical emergency. Follow these instructions at home:  Take medicines only as told by your doctor.  Do not use any tobacco products, including cigarettes, chewing tobacco, or electronic cigarettes. If you need help quitting, ask your doctor.  Lose weight if you are overweight, and maintain a healthy weight as told by your doctor.  Eat a diet that is low in fat and cholesterol. If you need help, ask your doctor.  Exercise regularly. Ask your doctor for some good activities for you.  Take good care of your feet.  Wear comfortable shoes that fit well.  Check your feet often for any cuts or sores. Contact a doctor if:  You have cramps in your legs while walking.  You have leg pain when you are at rest.  You have coldness in a leg or foot.  Your skin changes.  You are unable to get or have an erection (erectile dysfunction).  You have cuts or sores on your feet that are not healing. Get help right away if:  Your arm or leg turns cold and blue.  Your arms or legs  become red, warm, swollen, painful, or numb.  You have chest pain or trouble breathing.  You suddenly have weakness in your face, arm, or leg.  You become very confused or you cannot speak.  You suddenly have a very bad headache.  You suddenly cannot see. This information is not intended to replace advice given to you by your health care provider. Make sure you discuss any questions you have with your health care provider. Document Released: 09/06/2009 Document Revised: 11/18/2015 Document Reviewed: 11/20/2013 Elsevier Interactive Patient Education  2017 Elsevier Inc.  

## 2017-03-14 DIAGNOSIS — R319 Hematuria, unspecified: Secondary | ICD-10-CM | POA: Diagnosis not present

## 2017-03-14 DIAGNOSIS — N186 End stage renal disease: Secondary | ICD-10-CM | POA: Diagnosis not present

## 2017-03-14 DIAGNOSIS — N2581 Secondary hyperparathyroidism of renal origin: Secondary | ICD-10-CM | POA: Diagnosis not present

## 2017-03-14 DIAGNOSIS — E1129 Type 2 diabetes mellitus with other diabetic kidney complication: Secondary | ICD-10-CM | POA: Diagnosis not present

## 2017-03-14 DIAGNOSIS — D509 Iron deficiency anemia, unspecified: Secondary | ICD-10-CM | POA: Diagnosis not present

## 2017-03-16 DIAGNOSIS — R319 Hematuria, unspecified: Secondary | ICD-10-CM | POA: Diagnosis not present

## 2017-03-16 DIAGNOSIS — N186 End stage renal disease: Secondary | ICD-10-CM | POA: Diagnosis not present

## 2017-03-16 DIAGNOSIS — D509 Iron deficiency anemia, unspecified: Secondary | ICD-10-CM | POA: Diagnosis not present

## 2017-03-16 DIAGNOSIS — N2581 Secondary hyperparathyroidism of renal origin: Secondary | ICD-10-CM | POA: Diagnosis not present

## 2017-03-16 DIAGNOSIS — E1129 Type 2 diabetes mellitus with other diabetic kidney complication: Secondary | ICD-10-CM | POA: Diagnosis not present

## 2017-03-19 DIAGNOSIS — E1129 Type 2 diabetes mellitus with other diabetic kidney complication: Secondary | ICD-10-CM | POA: Diagnosis not present

## 2017-03-19 DIAGNOSIS — N186 End stage renal disease: Secondary | ICD-10-CM | POA: Diagnosis not present

## 2017-03-19 DIAGNOSIS — D509 Iron deficiency anemia, unspecified: Secondary | ICD-10-CM | POA: Diagnosis not present

## 2017-03-19 DIAGNOSIS — R319 Hematuria, unspecified: Secondary | ICD-10-CM | POA: Diagnosis not present

## 2017-03-19 DIAGNOSIS — N2581 Secondary hyperparathyroidism of renal origin: Secondary | ICD-10-CM | POA: Diagnosis not present

## 2017-03-20 DIAGNOSIS — T82858A Stenosis of vascular prosthetic devices, implants and grafts, initial encounter: Secondary | ICD-10-CM | POA: Diagnosis not present

## 2017-03-20 DIAGNOSIS — Z992 Dependence on renal dialysis: Secondary | ICD-10-CM | POA: Diagnosis not present

## 2017-03-20 DIAGNOSIS — I871 Compression of vein: Secondary | ICD-10-CM | POA: Diagnosis not present

## 2017-03-20 DIAGNOSIS — N186 End stage renal disease: Secondary | ICD-10-CM | POA: Diagnosis not present

## 2017-03-21 DIAGNOSIS — R319 Hematuria, unspecified: Secondary | ICD-10-CM | POA: Diagnosis not present

## 2017-03-21 DIAGNOSIS — N186 End stage renal disease: Secondary | ICD-10-CM | POA: Diagnosis not present

## 2017-03-21 DIAGNOSIS — N2581 Secondary hyperparathyroidism of renal origin: Secondary | ICD-10-CM | POA: Diagnosis not present

## 2017-03-21 DIAGNOSIS — E1129 Type 2 diabetes mellitus with other diabetic kidney complication: Secondary | ICD-10-CM | POA: Diagnosis not present

## 2017-03-21 DIAGNOSIS — D509 Iron deficiency anemia, unspecified: Secondary | ICD-10-CM | POA: Diagnosis not present

## 2017-03-22 NOTE — Addendum Note (Signed)
Addended by: Lianne Cure A on: 03/22/2017 03:43 PM   Modules accepted: Orders

## 2017-03-23 DIAGNOSIS — D509 Iron deficiency anemia, unspecified: Secondary | ICD-10-CM | POA: Diagnosis not present

## 2017-03-23 DIAGNOSIS — N186 End stage renal disease: Secondary | ICD-10-CM | POA: Diagnosis not present

## 2017-03-23 DIAGNOSIS — R319 Hematuria, unspecified: Secondary | ICD-10-CM | POA: Diagnosis not present

## 2017-03-23 DIAGNOSIS — E1129 Type 2 diabetes mellitus with other diabetic kidney complication: Secondary | ICD-10-CM | POA: Diagnosis not present

## 2017-03-23 DIAGNOSIS — N2581 Secondary hyperparathyroidism of renal origin: Secondary | ICD-10-CM | POA: Diagnosis not present

## 2017-03-25 DIAGNOSIS — N186 End stage renal disease: Secondary | ICD-10-CM | POA: Diagnosis not present

## 2017-03-25 DIAGNOSIS — Z992 Dependence on renal dialysis: Secondary | ICD-10-CM | POA: Diagnosis not present

## 2017-03-25 DIAGNOSIS — E1122 Type 2 diabetes mellitus with diabetic chronic kidney disease: Secondary | ICD-10-CM | POA: Diagnosis not present

## 2017-03-26 DIAGNOSIS — N2581 Secondary hyperparathyroidism of renal origin: Secondary | ICD-10-CM | POA: Diagnosis not present

## 2017-03-26 DIAGNOSIS — E1129 Type 2 diabetes mellitus with other diabetic kidney complication: Secondary | ICD-10-CM | POA: Diagnosis not present

## 2017-03-26 DIAGNOSIS — N186 End stage renal disease: Secondary | ICD-10-CM | POA: Diagnosis not present

## 2017-03-26 DIAGNOSIS — E1122 Type 2 diabetes mellitus with diabetic chronic kidney disease: Secondary | ICD-10-CM | POA: Diagnosis not present

## 2017-03-26 DIAGNOSIS — D509 Iron deficiency anemia, unspecified: Secondary | ICD-10-CM | POA: Diagnosis not present

## 2017-03-30 DIAGNOSIS — N186 End stage renal disease: Secondary | ICD-10-CM | POA: Diagnosis not present

## 2017-03-30 DIAGNOSIS — E1122 Type 2 diabetes mellitus with diabetic chronic kidney disease: Secondary | ICD-10-CM | POA: Diagnosis not present

## 2017-03-30 DIAGNOSIS — E1129 Type 2 diabetes mellitus with other diabetic kidney complication: Secondary | ICD-10-CM | POA: Diagnosis not present

## 2017-03-30 DIAGNOSIS — N2581 Secondary hyperparathyroidism of renal origin: Secondary | ICD-10-CM | POA: Diagnosis not present

## 2017-03-30 DIAGNOSIS — D509 Iron deficiency anemia, unspecified: Secondary | ICD-10-CM | POA: Diagnosis not present

## 2017-04-02 DIAGNOSIS — E1122 Type 2 diabetes mellitus with diabetic chronic kidney disease: Secondary | ICD-10-CM | POA: Diagnosis not present

## 2017-04-02 DIAGNOSIS — N186 End stage renal disease: Secondary | ICD-10-CM | POA: Diagnosis not present

## 2017-04-02 DIAGNOSIS — N2581 Secondary hyperparathyroidism of renal origin: Secondary | ICD-10-CM | POA: Diagnosis not present

## 2017-04-02 DIAGNOSIS — E1129 Type 2 diabetes mellitus with other diabetic kidney complication: Secondary | ICD-10-CM | POA: Diagnosis not present

## 2017-04-02 DIAGNOSIS — D509 Iron deficiency anemia, unspecified: Secondary | ICD-10-CM | POA: Diagnosis not present

## 2017-04-04 DIAGNOSIS — N186 End stage renal disease: Secondary | ICD-10-CM | POA: Diagnosis not present

## 2017-04-04 DIAGNOSIS — N2581 Secondary hyperparathyroidism of renal origin: Secondary | ICD-10-CM | POA: Diagnosis not present

## 2017-04-04 DIAGNOSIS — E1122 Type 2 diabetes mellitus with diabetic chronic kidney disease: Secondary | ICD-10-CM | POA: Diagnosis not present

## 2017-04-04 DIAGNOSIS — E1129 Type 2 diabetes mellitus with other diabetic kidney complication: Secondary | ICD-10-CM | POA: Diagnosis not present

## 2017-04-04 DIAGNOSIS — D509 Iron deficiency anemia, unspecified: Secondary | ICD-10-CM | POA: Diagnosis not present

## 2017-04-05 ENCOUNTER — Ambulatory Visit (INDEPENDENT_AMBULATORY_CARE_PROVIDER_SITE_OTHER): Payer: Medicare Other

## 2017-04-05 DIAGNOSIS — I2510 Atherosclerotic heart disease of native coronary artery without angina pectoris: Secondary | ICD-10-CM | POA: Diagnosis not present

## 2017-04-05 DIAGNOSIS — E1129 Type 2 diabetes mellitus with other diabetic kidney complication: Secondary | ICD-10-CM | POA: Diagnosis not present

## 2017-04-05 DIAGNOSIS — Z5181 Encounter for therapeutic drug level monitoring: Secondary | ICD-10-CM

## 2017-04-05 DIAGNOSIS — I482 Chronic atrial fibrillation, unspecified: Secondary | ICD-10-CM

## 2017-04-05 DIAGNOSIS — N185 Chronic kidney disease, stage 5: Secondary | ICD-10-CM | POA: Diagnosis not present

## 2017-04-05 DIAGNOSIS — G300 Alzheimer's disease with early onset: Secondary | ICD-10-CM | POA: Diagnosis not present

## 2017-04-05 DIAGNOSIS — Z6825 Body mass index (BMI) 25.0-25.9, adult: Secondary | ICD-10-CM | POA: Diagnosis not present

## 2017-04-05 DIAGNOSIS — M25561 Pain in right knee: Secondary | ICD-10-CM | POA: Diagnosis not present

## 2017-04-05 DIAGNOSIS — I7389 Other specified peripheral vascular diseases: Secondary | ICD-10-CM | POA: Diagnosis not present

## 2017-04-05 DIAGNOSIS — E7849 Other hyperlipidemia: Secondary | ICD-10-CM | POA: Diagnosis not present

## 2017-04-05 DIAGNOSIS — Z23 Encounter for immunization: Secondary | ICD-10-CM | POA: Diagnosis not present

## 2017-04-05 DIAGNOSIS — I10 Essential (primary) hypertension: Secondary | ICD-10-CM | POA: Diagnosis not present

## 2017-04-05 LAB — POCT INR: INR: 1.9

## 2017-04-06 DIAGNOSIS — D509 Iron deficiency anemia, unspecified: Secondary | ICD-10-CM | POA: Diagnosis not present

## 2017-04-06 DIAGNOSIS — N186 End stage renal disease: Secondary | ICD-10-CM | POA: Diagnosis not present

## 2017-04-06 DIAGNOSIS — E1122 Type 2 diabetes mellitus with diabetic chronic kidney disease: Secondary | ICD-10-CM | POA: Diagnosis not present

## 2017-04-06 DIAGNOSIS — E1129 Type 2 diabetes mellitus with other diabetic kidney complication: Secondary | ICD-10-CM | POA: Diagnosis not present

## 2017-04-06 DIAGNOSIS — N2581 Secondary hyperparathyroidism of renal origin: Secondary | ICD-10-CM | POA: Diagnosis not present

## 2017-04-09 DIAGNOSIS — L239 Allergic contact dermatitis, unspecified cause: Secondary | ICD-10-CM | POA: Diagnosis not present

## 2017-04-09 DIAGNOSIS — Z6826 Body mass index (BMI) 26.0-26.9, adult: Secondary | ICD-10-CM | POA: Diagnosis not present

## 2017-04-09 DIAGNOSIS — L139 Bullous disorder, unspecified: Secondary | ICD-10-CM | POA: Diagnosis not present

## 2017-04-13 DIAGNOSIS — N186 End stage renal disease: Secondary | ICD-10-CM | POA: Diagnosis not present

## 2017-04-13 DIAGNOSIS — N2581 Secondary hyperparathyroidism of renal origin: Secondary | ICD-10-CM | POA: Diagnosis not present

## 2017-04-13 DIAGNOSIS — E1122 Type 2 diabetes mellitus with diabetic chronic kidney disease: Secondary | ICD-10-CM | POA: Diagnosis not present

## 2017-04-13 DIAGNOSIS — E1129 Type 2 diabetes mellitus with other diabetic kidney complication: Secondary | ICD-10-CM | POA: Diagnosis not present

## 2017-04-13 DIAGNOSIS — D509 Iron deficiency anemia, unspecified: Secondary | ICD-10-CM | POA: Diagnosis not present

## 2017-04-16 DIAGNOSIS — E1129 Type 2 diabetes mellitus with other diabetic kidney complication: Secondary | ICD-10-CM | POA: Diagnosis not present

## 2017-04-16 DIAGNOSIS — E1122 Type 2 diabetes mellitus with diabetic chronic kidney disease: Secondary | ICD-10-CM | POA: Diagnosis not present

## 2017-04-16 DIAGNOSIS — N2581 Secondary hyperparathyroidism of renal origin: Secondary | ICD-10-CM | POA: Diagnosis not present

## 2017-04-16 DIAGNOSIS — N186 End stage renal disease: Secondary | ICD-10-CM | POA: Diagnosis not present

## 2017-04-16 DIAGNOSIS — D509 Iron deficiency anemia, unspecified: Secondary | ICD-10-CM | POA: Diagnosis not present

## 2017-04-17 ENCOUNTER — Encounter: Payer: Self-pay | Admitting: Podiatry

## 2017-04-17 ENCOUNTER — Ambulatory Visit (INDEPENDENT_AMBULATORY_CARE_PROVIDER_SITE_OTHER): Payer: Medicare Other | Admitting: Podiatry

## 2017-04-17 DIAGNOSIS — L84 Corns and callosities: Secondary | ICD-10-CM

## 2017-04-17 DIAGNOSIS — E1151 Type 2 diabetes mellitus with diabetic peripheral angiopathy without gangrene: Secondary | ICD-10-CM | POA: Diagnosis not present

## 2017-04-17 NOTE — Progress Notes (Signed)
Patient ID: [name], male   DOB: 30-Nov-1933, 83 y.o.   MRN: [id number]    Subjective: Patient presents for follow-up care for posterior left heel ulcer from the visit of 02/13/2017 patient had previous history of a skin ulcer which was treated at the wound care center. Patient's wife describes blister formation on the lateral left heel in the past week and visit primary care physician who prescribed an antibiotic which she believes is doxycycline which is taking daily for the past 6 days and has another 2 days. Patient's wife states the areas looking considerably better  Objective: Patient is slow to respond DP right 2/4 DP left 1/4 PT pulses nonpalpable bilaterally Capillary reflex delayed bilaterally Posterior left heel has callus that remains closed after debridement. There is no surrounding erythema, edema or warmth Lateral left heel has low-grade erythema and resolving blister formation. There is no warmth, drainage or malodor Patient wearing boot on rleftfoot Plantar callus fifth MPJ right HAV right Hammertoe second bilaterally Atrophic skin bilaterally Toenails are elongated brittle, deformed   Assessment: Diabetic with peripheral arterial disease Pre-ulcerative callus posterior left heel Resolving blister/cellulitis lateral left heel  Plan: Debridement posterior left callus that remains closed DC Silvadene Apply Vaseline and Band-Aid. On the pre-ulcerative callus Instructed patient to wear heel protector and/or elevate leg on pillows to offload heel Complete any remaining oral antibiotics prescribed by outside physician.  Keep next scheduled visit for nail debridement

## 2017-04-17 NOTE — Patient Instructions (Signed)

## 2017-04-18 DIAGNOSIS — N186 End stage renal disease: Secondary | ICD-10-CM | POA: Diagnosis not present

## 2017-04-18 DIAGNOSIS — N2581 Secondary hyperparathyroidism of renal origin: Secondary | ICD-10-CM | POA: Diagnosis not present

## 2017-04-18 DIAGNOSIS — E1129 Type 2 diabetes mellitus with other diabetic kidney complication: Secondary | ICD-10-CM | POA: Diagnosis not present

## 2017-04-18 DIAGNOSIS — E1122 Type 2 diabetes mellitus with diabetic chronic kidney disease: Secondary | ICD-10-CM | POA: Diagnosis not present

## 2017-04-18 DIAGNOSIS — D509 Iron deficiency anemia, unspecified: Secondary | ICD-10-CM | POA: Diagnosis not present

## 2017-04-19 DIAGNOSIS — Z794 Long term (current) use of insulin: Secondary | ICD-10-CM | POA: Diagnosis not present

## 2017-04-19 DIAGNOSIS — I10 Essential (primary) hypertension: Secondary | ICD-10-CM | POA: Diagnosis not present

## 2017-04-19 DIAGNOSIS — E1165 Type 2 diabetes mellitus with hyperglycemia: Secondary | ICD-10-CM | POA: Diagnosis not present

## 2017-04-19 DIAGNOSIS — N185 Chronic kidney disease, stage 5: Secondary | ICD-10-CM | POA: Diagnosis not present

## 2017-04-20 DIAGNOSIS — N186 End stage renal disease: Secondary | ICD-10-CM | POA: Diagnosis not present

## 2017-04-20 DIAGNOSIS — N2581 Secondary hyperparathyroidism of renal origin: Secondary | ICD-10-CM | POA: Diagnosis not present

## 2017-04-20 DIAGNOSIS — E1129 Type 2 diabetes mellitus with other diabetic kidney complication: Secondary | ICD-10-CM | POA: Diagnosis not present

## 2017-04-20 DIAGNOSIS — E1122 Type 2 diabetes mellitus with diabetic chronic kidney disease: Secondary | ICD-10-CM | POA: Diagnosis not present

## 2017-04-20 DIAGNOSIS — D509 Iron deficiency anemia, unspecified: Secondary | ICD-10-CM | POA: Diagnosis not present

## 2017-04-23 DIAGNOSIS — D509 Iron deficiency anemia, unspecified: Secondary | ICD-10-CM | POA: Diagnosis not present

## 2017-04-23 DIAGNOSIS — E1129 Type 2 diabetes mellitus with other diabetic kidney complication: Secondary | ICD-10-CM | POA: Diagnosis not present

## 2017-04-23 DIAGNOSIS — N2581 Secondary hyperparathyroidism of renal origin: Secondary | ICD-10-CM | POA: Diagnosis not present

## 2017-04-23 DIAGNOSIS — N186 End stage renal disease: Secondary | ICD-10-CM | POA: Diagnosis not present

## 2017-04-23 DIAGNOSIS — E1122 Type 2 diabetes mellitus with diabetic chronic kidney disease: Secondary | ICD-10-CM | POA: Diagnosis not present

## 2017-04-25 DIAGNOSIS — N2581 Secondary hyperparathyroidism of renal origin: Secondary | ICD-10-CM | POA: Diagnosis not present

## 2017-04-25 DIAGNOSIS — E1129 Type 2 diabetes mellitus with other diabetic kidney complication: Secondary | ICD-10-CM | POA: Diagnosis not present

## 2017-04-25 DIAGNOSIS — Z992 Dependence on renal dialysis: Secondary | ICD-10-CM | POA: Diagnosis not present

## 2017-04-25 DIAGNOSIS — N186 End stage renal disease: Secondary | ICD-10-CM | POA: Diagnosis not present

## 2017-04-25 DIAGNOSIS — E1122 Type 2 diabetes mellitus with diabetic chronic kidney disease: Secondary | ICD-10-CM | POA: Diagnosis not present

## 2017-04-25 DIAGNOSIS — D509 Iron deficiency anemia, unspecified: Secondary | ICD-10-CM | POA: Diagnosis not present

## 2017-04-27 DIAGNOSIS — D509 Iron deficiency anemia, unspecified: Secondary | ICD-10-CM | POA: Diagnosis not present

## 2017-04-27 DIAGNOSIS — E1122 Type 2 diabetes mellitus with diabetic chronic kidney disease: Secondary | ICD-10-CM | POA: Diagnosis not present

## 2017-04-27 DIAGNOSIS — N2581 Secondary hyperparathyroidism of renal origin: Secondary | ICD-10-CM | POA: Diagnosis not present

## 2017-04-27 DIAGNOSIS — D631 Anemia in chronic kidney disease: Secondary | ICD-10-CM | POA: Diagnosis not present

## 2017-04-27 DIAGNOSIS — N186 End stage renal disease: Secondary | ICD-10-CM | POA: Diagnosis not present

## 2017-04-27 DIAGNOSIS — E1129 Type 2 diabetes mellitus with other diabetic kidney complication: Secondary | ICD-10-CM | POA: Diagnosis not present

## 2017-04-30 DIAGNOSIS — E1129 Type 2 diabetes mellitus with other diabetic kidney complication: Secondary | ICD-10-CM | POA: Diagnosis not present

## 2017-04-30 DIAGNOSIS — E1122 Type 2 diabetes mellitus with diabetic chronic kidney disease: Secondary | ICD-10-CM | POA: Diagnosis not present

## 2017-04-30 DIAGNOSIS — N186 End stage renal disease: Secondary | ICD-10-CM | POA: Diagnosis not present

## 2017-04-30 DIAGNOSIS — D509 Iron deficiency anemia, unspecified: Secondary | ICD-10-CM | POA: Diagnosis not present

## 2017-04-30 DIAGNOSIS — N2581 Secondary hyperparathyroidism of renal origin: Secondary | ICD-10-CM | POA: Diagnosis not present

## 2017-04-30 DIAGNOSIS — D631 Anemia in chronic kidney disease: Secondary | ICD-10-CM | POA: Diagnosis not present

## 2017-05-02 DIAGNOSIS — N186 End stage renal disease: Secondary | ICD-10-CM | POA: Diagnosis not present

## 2017-05-02 DIAGNOSIS — E1122 Type 2 diabetes mellitus with diabetic chronic kidney disease: Secondary | ICD-10-CM | POA: Diagnosis not present

## 2017-05-02 DIAGNOSIS — D631 Anemia in chronic kidney disease: Secondary | ICD-10-CM | POA: Diagnosis not present

## 2017-05-02 DIAGNOSIS — E1129 Type 2 diabetes mellitus with other diabetic kidney complication: Secondary | ICD-10-CM | POA: Diagnosis not present

## 2017-05-02 DIAGNOSIS — N2581 Secondary hyperparathyroidism of renal origin: Secondary | ICD-10-CM | POA: Diagnosis not present

## 2017-05-02 DIAGNOSIS — D509 Iron deficiency anemia, unspecified: Secondary | ICD-10-CM | POA: Diagnosis not present

## 2017-05-03 ENCOUNTER — Ambulatory Visit (INDEPENDENT_AMBULATORY_CARE_PROVIDER_SITE_OTHER): Payer: Medicare Other | Admitting: *Deleted

## 2017-05-03 DIAGNOSIS — Z5181 Encounter for therapeutic drug level monitoring: Secondary | ICD-10-CM

## 2017-05-03 DIAGNOSIS — I482 Chronic atrial fibrillation, unspecified: Secondary | ICD-10-CM

## 2017-05-03 LAB — POCT INR: INR: 2.3

## 2017-05-04 DIAGNOSIS — E1122 Type 2 diabetes mellitus with diabetic chronic kidney disease: Secondary | ICD-10-CM | POA: Diagnosis not present

## 2017-05-04 DIAGNOSIS — D631 Anemia in chronic kidney disease: Secondary | ICD-10-CM | POA: Diagnosis not present

## 2017-05-04 DIAGNOSIS — N2581 Secondary hyperparathyroidism of renal origin: Secondary | ICD-10-CM | POA: Diagnosis not present

## 2017-05-04 DIAGNOSIS — N186 End stage renal disease: Secondary | ICD-10-CM | POA: Diagnosis not present

## 2017-05-04 DIAGNOSIS — D509 Iron deficiency anemia, unspecified: Secondary | ICD-10-CM | POA: Diagnosis not present

## 2017-05-04 DIAGNOSIS — E1129 Type 2 diabetes mellitus with other diabetic kidney complication: Secondary | ICD-10-CM | POA: Diagnosis not present

## 2017-05-07 DIAGNOSIS — D631 Anemia in chronic kidney disease: Secondary | ICD-10-CM | POA: Diagnosis not present

## 2017-05-07 DIAGNOSIS — N186 End stage renal disease: Secondary | ICD-10-CM | POA: Diagnosis not present

## 2017-05-07 DIAGNOSIS — N2581 Secondary hyperparathyroidism of renal origin: Secondary | ICD-10-CM | POA: Diagnosis not present

## 2017-05-07 DIAGNOSIS — E1122 Type 2 diabetes mellitus with diabetic chronic kidney disease: Secondary | ICD-10-CM | POA: Diagnosis not present

## 2017-05-07 DIAGNOSIS — D509 Iron deficiency anemia, unspecified: Secondary | ICD-10-CM | POA: Diagnosis not present

## 2017-05-07 DIAGNOSIS — E1129 Type 2 diabetes mellitus with other diabetic kidney complication: Secondary | ICD-10-CM | POA: Diagnosis not present

## 2017-05-09 DIAGNOSIS — E1122 Type 2 diabetes mellitus with diabetic chronic kidney disease: Secondary | ICD-10-CM | POA: Diagnosis not present

## 2017-05-09 DIAGNOSIS — D631 Anemia in chronic kidney disease: Secondary | ICD-10-CM | POA: Diagnosis not present

## 2017-05-09 DIAGNOSIS — D509 Iron deficiency anemia, unspecified: Secondary | ICD-10-CM | POA: Diagnosis not present

## 2017-05-09 DIAGNOSIS — E1129 Type 2 diabetes mellitus with other diabetic kidney complication: Secondary | ICD-10-CM | POA: Diagnosis not present

## 2017-05-09 DIAGNOSIS — N186 End stage renal disease: Secondary | ICD-10-CM | POA: Diagnosis not present

## 2017-05-09 DIAGNOSIS — N2581 Secondary hyperparathyroidism of renal origin: Secondary | ICD-10-CM | POA: Diagnosis not present

## 2017-05-11 DIAGNOSIS — D631 Anemia in chronic kidney disease: Secondary | ICD-10-CM | POA: Diagnosis not present

## 2017-05-11 DIAGNOSIS — N186 End stage renal disease: Secondary | ICD-10-CM | POA: Diagnosis not present

## 2017-05-11 DIAGNOSIS — D509 Iron deficiency anemia, unspecified: Secondary | ICD-10-CM | POA: Diagnosis not present

## 2017-05-11 DIAGNOSIS — E1129 Type 2 diabetes mellitus with other diabetic kidney complication: Secondary | ICD-10-CM | POA: Diagnosis not present

## 2017-05-11 DIAGNOSIS — N2581 Secondary hyperparathyroidism of renal origin: Secondary | ICD-10-CM | POA: Diagnosis not present

## 2017-05-11 DIAGNOSIS — E1122 Type 2 diabetes mellitus with diabetic chronic kidney disease: Secondary | ICD-10-CM | POA: Diagnosis not present

## 2017-05-15 ENCOUNTER — Ambulatory Visit: Payer: Medicare Other | Admitting: Podiatry

## 2017-05-15 DIAGNOSIS — E1122 Type 2 diabetes mellitus with diabetic chronic kidney disease: Secondary | ICD-10-CM | POA: Diagnosis not present

## 2017-05-15 DIAGNOSIS — D631 Anemia in chronic kidney disease: Secondary | ICD-10-CM | POA: Diagnosis not present

## 2017-05-15 DIAGNOSIS — E1129 Type 2 diabetes mellitus with other diabetic kidney complication: Secondary | ICD-10-CM | POA: Diagnosis not present

## 2017-05-15 DIAGNOSIS — D509 Iron deficiency anemia, unspecified: Secondary | ICD-10-CM | POA: Diagnosis not present

## 2017-05-15 DIAGNOSIS — N2581 Secondary hyperparathyroidism of renal origin: Secondary | ICD-10-CM | POA: Diagnosis not present

## 2017-05-15 DIAGNOSIS — N186 End stage renal disease: Secondary | ICD-10-CM | POA: Diagnosis not present

## 2017-05-18 DIAGNOSIS — N186 End stage renal disease: Secondary | ICD-10-CM | POA: Diagnosis not present

## 2017-05-18 DIAGNOSIS — D509 Iron deficiency anemia, unspecified: Secondary | ICD-10-CM | POA: Diagnosis not present

## 2017-05-18 DIAGNOSIS — D631 Anemia in chronic kidney disease: Secondary | ICD-10-CM | POA: Diagnosis not present

## 2017-05-18 DIAGNOSIS — E1129 Type 2 diabetes mellitus with other diabetic kidney complication: Secondary | ICD-10-CM | POA: Diagnosis not present

## 2017-05-18 DIAGNOSIS — E1122 Type 2 diabetes mellitus with diabetic chronic kidney disease: Secondary | ICD-10-CM | POA: Diagnosis not present

## 2017-05-18 DIAGNOSIS — N2581 Secondary hyperparathyroidism of renal origin: Secondary | ICD-10-CM | POA: Diagnosis not present

## 2017-05-23 DIAGNOSIS — D631 Anemia in chronic kidney disease: Secondary | ICD-10-CM | POA: Diagnosis not present

## 2017-05-23 DIAGNOSIS — N186 End stage renal disease: Secondary | ICD-10-CM | POA: Diagnosis not present

## 2017-05-23 DIAGNOSIS — E1129 Type 2 diabetes mellitus with other diabetic kidney complication: Secondary | ICD-10-CM | POA: Diagnosis not present

## 2017-05-23 DIAGNOSIS — E1122 Type 2 diabetes mellitus with diabetic chronic kidney disease: Secondary | ICD-10-CM | POA: Diagnosis not present

## 2017-05-23 DIAGNOSIS — D509 Iron deficiency anemia, unspecified: Secondary | ICD-10-CM | POA: Diagnosis not present

## 2017-05-23 DIAGNOSIS — N2581 Secondary hyperparathyroidism of renal origin: Secondary | ICD-10-CM | POA: Diagnosis not present

## 2017-05-25 DIAGNOSIS — E1122 Type 2 diabetes mellitus with diabetic chronic kidney disease: Secondary | ICD-10-CM | POA: Diagnosis not present

## 2017-05-25 DIAGNOSIS — Z992 Dependence on renal dialysis: Secondary | ICD-10-CM | POA: Diagnosis not present

## 2017-05-25 DIAGNOSIS — D509 Iron deficiency anemia, unspecified: Secondary | ICD-10-CM | POA: Diagnosis not present

## 2017-05-25 DIAGNOSIS — D631 Anemia in chronic kidney disease: Secondary | ICD-10-CM | POA: Diagnosis not present

## 2017-05-25 DIAGNOSIS — N186 End stage renal disease: Secondary | ICD-10-CM | POA: Diagnosis not present

## 2017-05-25 DIAGNOSIS — E1129 Type 2 diabetes mellitus with other diabetic kidney complication: Secondary | ICD-10-CM | POA: Diagnosis not present

## 2017-05-25 DIAGNOSIS — N2581 Secondary hyperparathyroidism of renal origin: Secondary | ICD-10-CM | POA: Diagnosis not present

## 2017-05-28 DIAGNOSIS — D509 Iron deficiency anemia, unspecified: Secondary | ICD-10-CM | POA: Diagnosis not present

## 2017-05-28 DIAGNOSIS — N186 End stage renal disease: Secondary | ICD-10-CM | POA: Diagnosis not present

## 2017-05-28 DIAGNOSIS — N2581 Secondary hyperparathyroidism of renal origin: Secondary | ICD-10-CM | POA: Diagnosis not present

## 2017-05-28 DIAGNOSIS — E1129 Type 2 diabetes mellitus with other diabetic kidney complication: Secondary | ICD-10-CM | POA: Diagnosis not present

## 2017-05-31 ENCOUNTER — Ambulatory Visit (INDEPENDENT_AMBULATORY_CARE_PROVIDER_SITE_OTHER): Payer: Medicare Other | Admitting: *Deleted

## 2017-05-31 DIAGNOSIS — I482 Chronic atrial fibrillation, unspecified: Secondary | ICD-10-CM

## 2017-05-31 DIAGNOSIS — Z5181 Encounter for therapeutic drug level monitoring: Secondary | ICD-10-CM | POA: Diagnosis not present

## 2017-05-31 LAB — POCT INR: INR: 2.8

## 2017-05-31 NOTE — Patient Instructions (Signed)
Continue taking the same dosage 1 tablet daily except 1.5 tablets on Mondays, Wednesdays, and Fridays. Recheck INR in 4 weeks. [facility] [phone]

## 2017-06-01 DIAGNOSIS — E1129 Type 2 diabetes mellitus with other diabetic kidney complication: Secondary | ICD-10-CM | POA: Diagnosis not present

## 2017-06-01 DIAGNOSIS — N186 End stage renal disease: Secondary | ICD-10-CM | POA: Diagnosis not present

## 2017-06-01 DIAGNOSIS — D509 Iron deficiency anemia, unspecified: Secondary | ICD-10-CM | POA: Diagnosis not present

## 2017-06-01 DIAGNOSIS — N2581 Secondary hyperparathyroidism of renal origin: Secondary | ICD-10-CM | POA: Diagnosis not present

## 2017-06-06 DIAGNOSIS — N2581 Secondary hyperparathyroidism of renal origin: Secondary | ICD-10-CM | POA: Diagnosis not present

## 2017-06-06 DIAGNOSIS — E1129 Type 2 diabetes mellitus with other diabetic kidney complication: Secondary | ICD-10-CM | POA: Diagnosis not present

## 2017-06-06 DIAGNOSIS — D509 Iron deficiency anemia, unspecified: Secondary | ICD-10-CM | POA: Diagnosis not present

## 2017-06-06 DIAGNOSIS — N186 End stage renal disease: Secondary | ICD-10-CM | POA: Diagnosis not present

## 2017-06-08 DIAGNOSIS — E1129 Type 2 diabetes mellitus with other diabetic kidney complication: Secondary | ICD-10-CM | POA: Diagnosis not present

## 2017-06-08 DIAGNOSIS — N186 End stage renal disease: Secondary | ICD-10-CM | POA: Diagnosis not present

## 2017-06-08 DIAGNOSIS — D509 Iron deficiency anemia, unspecified: Secondary | ICD-10-CM | POA: Diagnosis not present

## 2017-06-08 DIAGNOSIS — N2581 Secondary hyperparathyroidism of renal origin: Secondary | ICD-10-CM | POA: Diagnosis not present

## 2017-06-11 DIAGNOSIS — E1129 Type 2 diabetes mellitus with other diabetic kidney complication: Secondary | ICD-10-CM | POA: Diagnosis not present

## 2017-06-11 DIAGNOSIS — D509 Iron deficiency anemia, unspecified: Secondary | ICD-10-CM | POA: Diagnosis not present

## 2017-06-11 DIAGNOSIS — N186 End stage renal disease: Secondary | ICD-10-CM | POA: Diagnosis not present

## 2017-06-11 DIAGNOSIS — N2581 Secondary hyperparathyroidism of renal origin: Secondary | ICD-10-CM | POA: Diagnosis not present

## 2017-06-13 DIAGNOSIS — N186 End stage renal disease: Secondary | ICD-10-CM | POA: Diagnosis not present

## 2017-06-13 DIAGNOSIS — D509 Iron deficiency anemia, unspecified: Secondary | ICD-10-CM | POA: Diagnosis not present

## 2017-06-13 DIAGNOSIS — N2581 Secondary hyperparathyroidism of renal origin: Secondary | ICD-10-CM | POA: Diagnosis not present

## 2017-06-13 DIAGNOSIS — E1129 Type 2 diabetes mellitus with other diabetic kidney complication: Secondary | ICD-10-CM | POA: Diagnosis not present

## 2017-06-14 ENCOUNTER — Other Ambulatory Visit: Payer: Self-pay | Admitting: Cardiovascular Disease

## 2017-06-15 DIAGNOSIS — E1129 Type 2 diabetes mellitus with other diabetic kidney complication: Secondary | ICD-10-CM | POA: Diagnosis not present

## 2017-06-15 DIAGNOSIS — N2581 Secondary hyperparathyroidism of renal origin: Secondary | ICD-10-CM | POA: Diagnosis not present

## 2017-06-15 DIAGNOSIS — D509 Iron deficiency anemia, unspecified: Secondary | ICD-10-CM | POA: Diagnosis not present

## 2017-06-15 DIAGNOSIS — N186 End stage renal disease: Secondary | ICD-10-CM | POA: Diagnosis not present

## 2017-06-20 DIAGNOSIS — N2581 Secondary hyperparathyroidism of renal origin: Secondary | ICD-10-CM | POA: Diagnosis not present

## 2017-06-20 DIAGNOSIS — E1129 Type 2 diabetes mellitus with other diabetic kidney complication: Secondary | ICD-10-CM | POA: Diagnosis not present

## 2017-06-20 DIAGNOSIS — D509 Iron deficiency anemia, unspecified: Secondary | ICD-10-CM | POA: Diagnosis not present

## 2017-06-20 DIAGNOSIS — N186 End stage renal disease: Secondary | ICD-10-CM | POA: Diagnosis not present

## 2017-06-22 DIAGNOSIS — N2581 Secondary hyperparathyroidism of renal origin: Secondary | ICD-10-CM | POA: Diagnosis not present

## 2017-06-22 DIAGNOSIS — E1129 Type 2 diabetes mellitus with other diabetic kidney complication: Secondary | ICD-10-CM | POA: Diagnosis not present

## 2017-06-22 DIAGNOSIS — D509 Iron deficiency anemia, unspecified: Secondary | ICD-10-CM | POA: Diagnosis not present

## 2017-06-22 DIAGNOSIS — N186 End stage renal disease: Secondary | ICD-10-CM | POA: Diagnosis not present

## 2017-06-25 DIAGNOSIS — N186 End stage renal disease: Secondary | ICD-10-CM | POA: Diagnosis not present

## 2017-06-25 DIAGNOSIS — E1122 Type 2 diabetes mellitus with diabetic chronic kidney disease: Secondary | ICD-10-CM | POA: Diagnosis not present

## 2017-06-25 DIAGNOSIS — Z992 Dependence on renal dialysis: Secondary | ICD-10-CM | POA: Diagnosis not present

## 2017-06-27 DIAGNOSIS — D631 Anemia in chronic kidney disease: Secondary | ICD-10-CM | POA: Diagnosis not present

## 2017-06-27 DIAGNOSIS — D509 Iron deficiency anemia, unspecified: Secondary | ICD-10-CM | POA: Diagnosis not present

## 2017-06-27 DIAGNOSIS — N2581 Secondary hyperparathyroidism of renal origin: Secondary | ICD-10-CM | POA: Diagnosis not present

## 2017-06-27 DIAGNOSIS — E1129 Type 2 diabetes mellitus with other diabetic kidney complication: Secondary | ICD-10-CM | POA: Diagnosis not present

## 2017-06-27 DIAGNOSIS — N186 End stage renal disease: Secondary | ICD-10-CM | POA: Diagnosis not present

## 2017-06-27 DIAGNOSIS — E1122 Type 2 diabetes mellitus with diabetic chronic kidney disease: Secondary | ICD-10-CM | POA: Diagnosis not present

## 2017-06-28 ENCOUNTER — Ambulatory Visit (INDEPENDENT_AMBULATORY_CARE_PROVIDER_SITE_OTHER): Payer: Medicare Other | Admitting: *Deleted

## 2017-06-28 DIAGNOSIS — I482 Chronic atrial fibrillation, unspecified: Secondary | ICD-10-CM

## 2017-06-28 DIAGNOSIS — Z5181 Encounter for therapeutic drug level monitoring: Secondary | ICD-10-CM | POA: Diagnosis not present

## 2017-06-28 LAB — POCT INR: INR: 1.7

## 2017-06-28 NOTE — Patient Instructions (Signed)
Description   Today January 3rd take 1 and 1/2 tablets (7.5mg ) then continue taking the same dosage 1 tablet daily except 1and 1/2 tablets on Mondays, Wednesdays, and Fridays. Recheck INR in 2 weeks. [facility] [phone]

## 2017-06-29 DIAGNOSIS — E1129 Type 2 diabetes mellitus with other diabetic kidney complication: Secondary | ICD-10-CM | POA: Diagnosis not present

## 2017-06-29 DIAGNOSIS — N186 End stage renal disease: Secondary | ICD-10-CM | POA: Diagnosis not present

## 2017-06-29 DIAGNOSIS — D631 Anemia in chronic kidney disease: Secondary | ICD-10-CM | POA: Diagnosis not present

## 2017-06-29 DIAGNOSIS — D509 Iron deficiency anemia, unspecified: Secondary | ICD-10-CM | POA: Diagnosis not present

## 2017-06-29 DIAGNOSIS — N2581 Secondary hyperparathyroidism of renal origin: Secondary | ICD-10-CM | POA: Diagnosis not present

## 2017-06-29 DIAGNOSIS — E1122 Type 2 diabetes mellitus with diabetic chronic kidney disease: Secondary | ICD-10-CM | POA: Diagnosis not present

## 2017-07-02 DIAGNOSIS — N2581 Secondary hyperparathyroidism of renal origin: Secondary | ICD-10-CM | POA: Diagnosis not present

## 2017-07-02 DIAGNOSIS — E1129 Type 2 diabetes mellitus with other diabetic kidney complication: Secondary | ICD-10-CM | POA: Diagnosis not present

## 2017-07-02 DIAGNOSIS — N186 End stage renal disease: Secondary | ICD-10-CM | POA: Diagnosis not present

## 2017-07-02 DIAGNOSIS — I871 Compression of vein: Secondary | ICD-10-CM | POA: Diagnosis not present

## 2017-07-02 DIAGNOSIS — T82858A Stenosis of vascular prosthetic devices, implants and grafts, initial encounter: Secondary | ICD-10-CM | POA: Diagnosis not present

## 2017-07-02 DIAGNOSIS — E1122 Type 2 diabetes mellitus with diabetic chronic kidney disease: Secondary | ICD-10-CM | POA: Diagnosis not present

## 2017-07-02 DIAGNOSIS — D631 Anemia in chronic kidney disease: Secondary | ICD-10-CM | POA: Diagnosis not present

## 2017-07-02 DIAGNOSIS — Z992 Dependence on renal dialysis: Secondary | ICD-10-CM | POA: Diagnosis not present

## 2017-07-02 DIAGNOSIS — D509 Iron deficiency anemia, unspecified: Secondary | ICD-10-CM | POA: Diagnosis not present

## 2017-07-03 ENCOUNTER — Ambulatory Visit (INDEPENDENT_AMBULATORY_CARE_PROVIDER_SITE_OTHER): Payer: Medicare Other | Admitting: Podiatry

## 2017-07-03 DIAGNOSIS — E1151 Type 2 diabetes mellitus with diabetic peripheral angiopathy without gangrene: Secondary | ICD-10-CM

## 2017-07-03 DIAGNOSIS — B351 Tinea unguium: Secondary | ICD-10-CM

## 2017-07-03 DIAGNOSIS — D689 Coagulation defect, unspecified: Secondary | ICD-10-CM

## 2017-07-03 DIAGNOSIS — L84 Corns and callosities: Secondary | ICD-10-CM

## 2017-07-03 DIAGNOSIS — M79676 Pain in unspecified toe(s): Secondary | ICD-10-CM

## 2017-07-03 NOTE — Progress Notes (Signed)
Subjective: 84 y.o. returns the office today for painful, elongated, thickened toenails which he cannot trim himself. Denies any redness or drainage around the nails.  He also states he has a callus in the back of his left heel which becomes painful.  His wife is up with counseling but she recently had shoulder surgery and has not been doing this.  Denies any drainage or swelling.  Denies any acute changes since last appointment and no new complaints today. Denies any systemic complaints such as fevers, chills, nausea, vomiting.   He is on Coumadin  PCP: Leanna Battles, MD  Objective: AAO 3, NAD DP pulse 2/4 on the right, 1/1 the left and PT pulses nonpalpable bilaterally this is unchanged compared to previous appointment.  Nails hypertrophic, dystrophic, elongated, brittle, discolored 10. There is tenderness overlying the nails 1-5 bilaterally. There is no surrounding erythema or drainage along the nail sites. Pre-ulcerative hyperkeratotic lesion to the posterior aspect the left heel.  Upon debridement there is no underlying ulceration, drainage or any signs of infection noted today. No open lesions or pre-ulcerative lesions are identified. No other areas of tenderness bilateral lower extremities. No overlying edema, erythema, increased warmth. No pain with calf compression, swelling, warmth, erythema.  Assessment: Patient presents with symptomatic onychomycosis; pre-ulcerative callus; currently on Coumadin as well as PAD  Plan: -Treatment options including alternatives, risks, complications were discussed -Nails sharply debrided 10 without complication/bleeding. -Pre-ulcerative callus was sharply debrided x1 without any complications or bleeding -Discussed daily foot inspection. If there are any changes, to call the office immediately.  No skin breakdown currently to monitor closely -Follow-up in 3 months or sooner if any problems are to arise. In the meantime, encouraged to call the  office with any questions, concerns, changes symptoms.  Celesta Gentile, DPM

## 2017-07-04 DIAGNOSIS — N2581 Secondary hyperparathyroidism of renal origin: Secondary | ICD-10-CM | POA: Diagnosis not present

## 2017-07-04 DIAGNOSIS — E1129 Type 2 diabetes mellitus with other diabetic kidney complication: Secondary | ICD-10-CM | POA: Diagnosis not present

## 2017-07-04 DIAGNOSIS — E1122 Type 2 diabetes mellitus with diabetic chronic kidney disease: Secondary | ICD-10-CM | POA: Diagnosis not present

## 2017-07-04 DIAGNOSIS — D509 Iron deficiency anemia, unspecified: Secondary | ICD-10-CM | POA: Diagnosis not present

## 2017-07-04 DIAGNOSIS — D631 Anemia in chronic kidney disease: Secondary | ICD-10-CM | POA: Diagnosis not present

## 2017-07-04 DIAGNOSIS — N186 End stage renal disease: Secondary | ICD-10-CM | POA: Diagnosis not present

## 2017-07-06 DIAGNOSIS — E1122 Type 2 diabetes mellitus with diabetic chronic kidney disease: Secondary | ICD-10-CM | POA: Diagnosis not present

## 2017-07-06 DIAGNOSIS — D509 Iron deficiency anemia, unspecified: Secondary | ICD-10-CM | POA: Diagnosis not present

## 2017-07-06 DIAGNOSIS — E1129 Type 2 diabetes mellitus with other diabetic kidney complication: Secondary | ICD-10-CM | POA: Diagnosis not present

## 2017-07-06 DIAGNOSIS — N186 End stage renal disease: Secondary | ICD-10-CM | POA: Diagnosis not present

## 2017-07-06 DIAGNOSIS — N2581 Secondary hyperparathyroidism of renal origin: Secondary | ICD-10-CM | POA: Diagnosis not present

## 2017-07-06 DIAGNOSIS — D631 Anemia in chronic kidney disease: Secondary | ICD-10-CM | POA: Diagnosis not present

## 2017-07-09 DIAGNOSIS — E1122 Type 2 diabetes mellitus with diabetic chronic kidney disease: Secondary | ICD-10-CM | POA: Diagnosis not present

## 2017-07-09 DIAGNOSIS — E1129 Type 2 diabetes mellitus with other diabetic kidney complication: Secondary | ICD-10-CM | POA: Diagnosis not present

## 2017-07-09 DIAGNOSIS — N2581 Secondary hyperparathyroidism of renal origin: Secondary | ICD-10-CM | POA: Diagnosis not present

## 2017-07-09 DIAGNOSIS — D631 Anemia in chronic kidney disease: Secondary | ICD-10-CM | POA: Diagnosis not present

## 2017-07-09 DIAGNOSIS — N186 End stage renal disease: Secondary | ICD-10-CM | POA: Diagnosis not present

## 2017-07-09 DIAGNOSIS — D509 Iron deficiency anemia, unspecified: Secondary | ICD-10-CM | POA: Diagnosis not present

## 2017-07-12 ENCOUNTER — Encounter: Payer: Self-pay | Admitting: Cardiovascular Disease

## 2017-07-12 ENCOUNTER — Ambulatory Visit (INDEPENDENT_AMBULATORY_CARE_PROVIDER_SITE_OTHER): Payer: Medicare Other | Admitting: Cardiovascular Disease

## 2017-07-12 ENCOUNTER — Ambulatory Visit (INDEPENDENT_AMBULATORY_CARE_PROVIDER_SITE_OTHER): Payer: Medicare Other

## 2017-07-12 VITALS — BP 176/52 | HR 73 | Wt 175.8 lb

## 2017-07-12 DIAGNOSIS — I482 Chronic atrial fibrillation, unspecified: Secondary | ICD-10-CM

## 2017-07-12 DIAGNOSIS — Z5181 Encounter for therapeutic drug level monitoring: Secondary | ICD-10-CM | POA: Diagnosis not present

## 2017-07-12 DIAGNOSIS — I10 Essential (primary) hypertension: Secondary | ICD-10-CM | POA: Diagnosis not present

## 2017-07-12 DIAGNOSIS — I350 Nonrheumatic aortic (valve) stenosis: Secondary | ICD-10-CM | POA: Diagnosis not present

## 2017-07-12 DIAGNOSIS — I2510 Atherosclerotic heart disease of native coronary artery without angina pectoris: Secondary | ICD-10-CM | POA: Diagnosis not present

## 2017-07-12 LAB — LIPID PANEL
CHOL/HDL RATIO: 4.1 ratio (ref 0.0–5.0)
Cholesterol, Total: 136 mg/dL (ref 100–199)
HDL: 33 mg/dL — ABNORMAL LOW (ref >39)
LDL CALC: 50 mg/dL (ref 0–99)
Triglycerides: 267 mg/dL — ABNORMAL HIGH (ref 0–149)
VLDL Cholesterol Cal: 53 mg/dL — ABNORMAL HIGH (ref 5–40)

## 2017-07-12 LAB — BASIC METABOLIC PANEL
BUN / CREAT RATIO: 9 — AB (ref 10–24)
BUN: 52 mg/dL — ABNORMAL HIGH (ref 8–27)
CALCIUM: 9.1 mg/dL (ref 8.6–10.2)
CHLORIDE: 98 mmol/L (ref 96–106)
CO2: 28 mmol/L (ref 20–29)
Creatinine, Ser: 5.58 mg/dL — ABNORMAL HIGH (ref 0.76–1.27)
GFR calc non Af Amer: 9 mL/min/{1.73_m2} — ABNORMAL LOW (ref >59)
GFR, EST AFRICAN AMERICAN: 10 mL/min/{1.73_m2} — AB (ref >59)
Glucose: 144 mg/dL — ABNORMAL HIGH (ref 65–99)
Potassium: 5 mmol/L (ref 3.5–5.2)
Sodium: 141 mmol/L (ref 134–144)

## 2017-07-12 LAB — HEPATIC FUNCTION PANEL
ALT: 20 IU/L (ref 0–44)
AST: 19 IU/L (ref 0–40)
Albumin: 4.2 g/dL (ref 3.5–4.7)
Alkaline Phosphatase: 87 IU/L (ref 39–117)
BILIRUBIN TOTAL: 0.5 mg/dL (ref 0.0–1.2)
BILIRUBIN, DIRECT: 0.17 mg/dL (ref 0.00–0.40)
Total Protein: 6.3 g/dL (ref 6.0–8.5)

## 2017-07-12 LAB — POCT INR: INR: 2.2

## 2017-07-12 MED ORDER — NITROGLYCERIN 0.4 MG SL SUBL: 0.4000 mg | SUBLINGUAL_TABLET | SUBLINGUAL | 12 refills | Status: AC | PRN

## 2017-07-12 NOTE — Patient Instructions (Signed)
Description   Continue on same dosage 1 tablet daily except 1.5  tablets on Mondays, Wednesdays, and Fridays.  Recheck INR in 3 weeks. [facility] [phone]

## 2017-07-12 NOTE — Patient Instructions (Signed)

## 2017-07-12 NOTE — Progress Notes (Signed)
[name] Date of Birth  1934-05-03 [facility] [address]    [place], [place]  [address] [phone]  Fax  [phone]  Problem list:  1. Coronary artery disease 2. chronic kidney disease 3. Atrophic leg 4. Hypothyroidism  History of Present Illness:  [name] is a 85 year old gentleman with a history of coronary artery disease and coronary artery bypass grafting. He recently presented with some right-sided shoulder pain that radiates through to his chest. He had a stress Myoview study which revealed a small inferolateral defect. He was set up for cardiac catheterization but his precath labs revealed a creatinine of 2.4 which was new. His baseline creatinine is between 1.6 and 1.8. We discontinued his HCTZ and his ACE inhibitor.  He was seen by his medical doctor who also stop his metformin.  We were going to do a cardiac catheterization this winter but we canceled the case because of his renal insufficiency. His metformin was stopped and he made some other medical changes in his creatinine has now improved. Fortunately, he's not having any further episodes of angina.  He informed me that his insurance company is no longer paying for Ranexa.  He's been bradycardic for years. We do not have him on beta blocker for that reason.  He's had some shoulder problems. He needs to have surgery on his right shoulder soon. He will eventually need to have surgery on his left shoulder. He had a low risk  Myoview study in November, 2012. He had an echocardiogram in August, 2013 that was also unchanged. He does not follow a strict diabetic diet.  His glucose levels went very high after his steroid injection ( > 500)  September 27, 2012:  Pt is doing well.  He denies any chest pain. Has some shoulder soreness.  He does have chronic dyspnea and has had worsening dyspnea since he had the flu in January.  He stays away from salt.  Nov. 13, 2014;  He contniues to have  dyspnea an CP.  He did not take a NTG.  He typically gets CP with exertion.   He is short of breath at rest and with exertion.  He seems to minimize his symptoms - wife thinks he has more shortness of breath that he admits to.    i was asked to see him again by Dr. Lorrene Reid for worsening dyspnea.  He avoids  salt.   Echo showed LVEF of 65-70% with LVH, .mild aortic stenosis  Feb. 13, 2015:  Pt is feeling well.  No angina.  Kidney function remained stable. His blood pressure is much better controlled during this visit compared to his last visit 3 months ago. He has been staying with her salt  February 13, 2014: Doing the same  Dec. 2, 2015: No new cardiac issues.  Has had some leg swelling.  Also has some red ulceration in the left lower leg.   They look like bug bites to me.  He has hypothyroidism  and is followed by Dr. Sharlett Iles.  Avoids salt.  Sleeps in a recliner - has lots of leg edema.   December 02, 2014  Pt is doing well.  No CP BP is typically elevated.   Dec. 8, 2016: [name] is seen today for  Evaluation of his coronary artery disease. Is seen in a wheelchair today .    Has a right leg boot on .  Fell and injurred his right leg.  No evidence  of fracture.   Aug. 17, 2017:  [name] is seen today for follow up of his chronic diastolic CHF Is on HD on September 18, 2015. BP has been very low . Says that he feels fine  On Imdur  Is a wheel chair today  Uses a walker at home .  September 07, 2016 BP has been elevated.  Has been on hemodialysis  No CP  BP at home has been somewhat variable - high at times  Jan. 17, 2019:"  BP has been normal at home.   A bit elevated here in the office Tries to follow his fluid and salt restriction    Current Outpatient Medications on File Prior to Visit  Medication Sig Dispense Refill  . acetaminophen (TYLENOL) 325 MG tablet Take 2 tablets (650 mg total) by mouth every 4 (four) hours as needed for headache or mild pain.    Marland Kitchen albuterol (PROVENTIL  HFA;VENTOLIN HFA) 108 (90 Base) MCG/ACT inhaler Inhale 2 puffs into the lungs every 4 (four) hours as needed for wheezing or shortness of breath. 1 Inhaler 0  . atorvastatin (LIPITOR) 80 MG tablet TAKE ONE TABLET BY MOUTH ONCE DAILY 90 tablet 3  . calcium acetate (PHOSLO) 667 MG capsule Take 1,334 mg by mouth 3 (three) times daily with meals.    . cholecalciferol (VITAMIN D) 1000 UNITS tablet Take 1,000 Units by mouth daily at 6 PM. D3    . divalproex (DEPAKOTE SPRINKLE) 125 MG capsule Take 125 mg by mouth daily.     . insulin aspart (NOVOLOG FLEXPEN) 100 UNIT/ML FlexPen Inject 8 Units into the skin 2 (two) times daily before a meal.    . Insulin Glargine (LANTUS SOLOSTAR) 100 UNIT/ML SOPN Inject 24 Units into the skin every morning.     Marland Kitchen levothyroxine (SYNTHROID, LEVOTHROID) 175 MCG tablet Take 175 mcg by mouth daily before breakfast.    . memantine (NAMENDA) 5 MG tablet Take 5 mg by mouth daily.     . Multiple Vitamins-Minerals (DECUBI-VITE) CAPS Take 1 capsule by mouth daily.    . multivitamin (RENA-VIT) TABS tablet Take 1 tablet by mouth daily.    . mupirocin ointment (BACTROBAN) 2 % Place 1 application into the nose 2 (two) times daily. 22 g 0  . nitroGLYCERIN (NITROSTAT) 0.4 MG SL tablet Place 1 tablet (0.4 mg total) under the tongue every 5 (five) minutes as needed for chest pain. 30 tablet 12  . polyethylene glycol (MIRALAX / GLYCOLAX) packet Take 17 g by mouth daily as needed for mild constipation.    . ranitidine (ZANTAC) 300 MG tablet Take 300 mg by mouth daily.    . silver sulfADIAZINE (SILVADENE) 1 % cream Apply 1 application topically daily. 50 g 0  . warfarin (COUMADIN) 5 MG tablet Take 5-7.5 mg by mouth See admin instructions. Take 1 1/2 (7.5mg ) tablet on M, W, F, and 1 tablet (5mg ) on Sun, T, Th, Sat    . warfarin (COUMADIN) 5 MG tablet USE AS DIRECTED 120 tablet 1   No current facility-administered medications on file prior to visit.     Allergies  Allergen Reactions  .  Betadine [Povidone Iodine] Rash  . Iodinated Diagnostic Agents Rash and Other (See Comments)    Does fine with premedications for cath  . Latex Hives  . Tape Rash    MUST BE FREE OF ANY LATEX    Past Medical History:  Diagnosis Date  . Atrial fibrillation ([place]) Aug. 2015   a. Dx 01/2014,  s/p DCCV 03/06/14.  Marland Kitchen CAD (coronary artery disease)    a. prior CABG in 1997 with redo in 2000. b. last cath in 2008 - managed medically  . Carotid disease, bilateral ([place])    with multiple bilateral carotid surgeries  . Chronic diastolic CHF (congestive heart failure) ([place])   . Chronic respiratory failure with hypoxia ([place])   . CKD (chronic kidney disease), stage IV ([place])    a. Has fistula in place.   . Degenerative joint disease   . Dementia   . Generalized weakness    without overt findings  . GERD (gastroesophageal reflux disease)   . Gout   . Heart murmur   . Hyperlipidemia   . Hypertension   . Hypothyroidism   . Meniere's disease   . On home oxygen therapy    "2L" qhs (09/09/2015  . Orthopnea    Two-pillow  . Peripheral vascular disease ([place])   . Psoriasis   . Shingles   . Sinus bradycardia    a. Toprol D/C'd due to this.   . Type 2 diabetes mellitus ([place])     Past Surgical History:  Procedure Laterality Date  . APPENDECTOMY    . AV FISTULA PLACEMENT Left 01/21/2013   Procedure: ARTERIOVENOUS (AV) FISTULA CREATION, Left Brachiocephalic;  Surgeon: Angelia Mould, MD;  Location: [facility];  Service: Vascular;  Laterality: Left;  . BLEPHAROPLASTY Bilateral   . CARDIAC CATHETERIZATION    . CARDIAC CATHETERIZATION  2008   L main irreg, LAD 80%, IMA-LAD & SVG-Diag patent, CFX 100%, SVG-OM 90%, RCA 70%, SVG-RCA OK, EF nl, med rx, no vessels appropriate for PCI  . CARDIAC CATHETERIZATION N/A 01/12/2016   Procedure: Left Heart Cath and Cors/Grafts Angiography;  Surgeon: Belva Crome, MD;  Location: [facility];  Service: Cardiovascular;  Laterality: N/A;  . CARDIOVERSION N/A  03/06/2014   Procedure: CARDIOVERSION;  Surgeon: Larey Dresser, MD;  Location: [facility];  Service: Cardiovascular;  Laterality: N/A;  . CAROTID ENDARTERECTOMY Bilateral "several times"   "5 on right; 2-3 on left" (09/09/2015)  . CORONARY ARTERY BYPASS GRAFT  1997;  2002  . FRACTURE SURGERY    . LUMBAR LAMINECTOMY  July 2001  . LUNG REMOVAL, PARTIAL    . SHOULDER SURGERY Bilateral   . TEE WITHOUT CARDIOVERSION N/A 03/06/2014   Procedure: TRANSESOPHAGEAL ECHOCARDIOGRAM (TEE);  Surgeon: Larey Dresser, MD;  Location: [facility];  Service: Cardiovascular;  Laterality: N/A;  . THORACOTOMY Left    due to fungal infection    Social History   Tobacco Use  Smoking Status Never Smoker  Smokeless Tobacco Never Used    Social History   Substance and Sexual Activity  Alcohol Use No  . Alcohol/week: 0.0 oz    Family History  Problem Relation Age of Onset  . Heart attack Father   . Heart disease Father        After 49 yrs of age  . Hyperlipidemia Father   . Hypertension Father   . Diabetes Mother   . Hypertension Mother   . Heart disease Mother   . Heart disease Brother   . Diabetes Brother     Reviw of Systems:  Reviewed in the HPI.  All other systems are negative.  Physical Exam: Blood pressure (!) 176/52, pulse 73, weight 175 lb 12.8 oz (79.7 kg), SpO2 98 %.  GEN:  Well nourished, well developed in no acute distress HEENT: Normal NECK: No JVD; No carotid bruits LYMPHATICS: No lymphadenopathy  CARDIAC: RR,  4-5/9 systolic murmur at left upper sternal border and to axilla  RESPIRATORY:  Clear to auscultation without rales, wheezing or rhonchi  ABDOMEN: Soft, non-tender, non-distended MUSCULOSKELETAL:  No edema; No deformity  SKIN: Warm and dry NEUROLOGIC:  Alert and oriented x 3   ECG:   Assessment / Plan:   1. CAD  -no episodes of angina  2.  Aortic stenosis: The patient has severe aortic stenosis.  He has fairly severe dementia.  He is a relatively poor  candidate for any procedures.  I would not think that he is a candidate for standard aortic valve repair and I think he is a poor candidate for TAVR  - I think his dementia would likely worsen  3. Chronic  diastolic CHF -echocardiogram in August, 2018 reveals normal left ventricular systolic function with ejection fraction of 55-60%.  He has grade 1 diastolic dysfunction.  He has severe aortic stenosis , mild AI with a mean gradient of 57 mmHg.  Trivial mitral regurgitation.   3. Atrial Fib- clinically  remains in NSR ,   5. Essential HTN:   - BP is high  .   He is on HD.   Will defer to nephrology   6. Dementia:   Wife states that he has been having progressive memory issues.    Wife continues to help with answering questions .  7.  ESRD:  He is now on HD.       Mertie Moores, MD  07/12/2017 10:44 AM    [address] [place],  [place], [place]  [address] Pager [phone] Phone: [phone]; Fax: [phone]

## 2017-07-13 DIAGNOSIS — E1122 Type 2 diabetes mellitus with diabetic chronic kidney disease: Secondary | ICD-10-CM | POA: Diagnosis not present

## 2017-07-13 DIAGNOSIS — N2581 Secondary hyperparathyroidism of renal origin: Secondary | ICD-10-CM | POA: Diagnosis not present

## 2017-07-13 DIAGNOSIS — D631 Anemia in chronic kidney disease: Secondary | ICD-10-CM | POA: Diagnosis not present

## 2017-07-13 DIAGNOSIS — D509 Iron deficiency anemia, unspecified: Secondary | ICD-10-CM | POA: Diagnosis not present

## 2017-07-13 DIAGNOSIS — E1129 Type 2 diabetes mellitus with other diabetic kidney complication: Secondary | ICD-10-CM | POA: Diagnosis not present

## 2017-07-13 DIAGNOSIS — N186 End stage renal disease: Secondary | ICD-10-CM | POA: Diagnosis not present

## 2017-07-16 DIAGNOSIS — D509 Iron deficiency anemia, unspecified: Secondary | ICD-10-CM | POA: Diagnosis not present

## 2017-07-16 DIAGNOSIS — N2581 Secondary hyperparathyroidism of renal origin: Secondary | ICD-10-CM | POA: Diagnosis not present

## 2017-07-16 DIAGNOSIS — E1122 Type 2 diabetes mellitus with diabetic chronic kidney disease: Secondary | ICD-10-CM | POA: Diagnosis not present

## 2017-07-16 DIAGNOSIS — E1129 Type 2 diabetes mellitus with other diabetic kidney complication: Secondary | ICD-10-CM | POA: Diagnosis not present

## 2017-07-16 DIAGNOSIS — N186 End stage renal disease: Secondary | ICD-10-CM | POA: Diagnosis not present

## 2017-07-16 DIAGNOSIS — D631 Anemia in chronic kidney disease: Secondary | ICD-10-CM | POA: Diagnosis not present

## 2017-07-18 DIAGNOSIS — N186 End stage renal disease: Secondary | ICD-10-CM | POA: Diagnosis not present

## 2017-07-18 DIAGNOSIS — D509 Iron deficiency anemia, unspecified: Secondary | ICD-10-CM | POA: Diagnosis not present

## 2017-07-18 DIAGNOSIS — N2581 Secondary hyperparathyroidism of renal origin: Secondary | ICD-10-CM | POA: Diagnosis not present

## 2017-07-18 DIAGNOSIS — E1122 Type 2 diabetes mellitus with diabetic chronic kidney disease: Secondary | ICD-10-CM | POA: Diagnosis not present

## 2017-07-18 DIAGNOSIS — E1129 Type 2 diabetes mellitus with other diabetic kidney complication: Secondary | ICD-10-CM | POA: Diagnosis not present

## 2017-07-18 DIAGNOSIS — D631 Anemia in chronic kidney disease: Secondary | ICD-10-CM | POA: Diagnosis not present

## 2017-07-19 ENCOUNTER — Telehealth: Payer: Self-pay | Admitting: Cardiovascular Disease

## 2017-07-19 NOTE — Telephone Encounter (Signed)
Reviewed lab results with patient's wife who verbalized understanding. She thanked me for the call.

## 2017-07-19 NOTE — Telephone Encounter (Signed)
Patient wife calling, states that she is returning call for lab results

## 2017-07-20 DIAGNOSIS — E1129 Type 2 diabetes mellitus with other diabetic kidney complication: Secondary | ICD-10-CM | POA: Diagnosis not present

## 2017-07-20 DIAGNOSIS — E1122 Type 2 diabetes mellitus with diabetic chronic kidney disease: Secondary | ICD-10-CM | POA: Diagnosis not present

## 2017-07-20 DIAGNOSIS — D631 Anemia in chronic kidney disease: Secondary | ICD-10-CM | POA: Diagnosis not present

## 2017-07-20 DIAGNOSIS — N2581 Secondary hyperparathyroidism of renal origin: Secondary | ICD-10-CM | POA: Diagnosis not present

## 2017-07-20 DIAGNOSIS — N186 End stage renal disease: Secondary | ICD-10-CM | POA: Diagnosis not present

## 2017-07-20 DIAGNOSIS — D509 Iron deficiency anemia, unspecified: Secondary | ICD-10-CM | POA: Diagnosis not present

## 2017-07-23 DIAGNOSIS — D631 Anemia in chronic kidney disease: Secondary | ICD-10-CM | POA: Diagnosis not present

## 2017-07-23 DIAGNOSIS — E1129 Type 2 diabetes mellitus with other diabetic kidney complication: Secondary | ICD-10-CM | POA: Diagnosis not present

## 2017-07-23 DIAGNOSIS — E1122 Type 2 diabetes mellitus with diabetic chronic kidney disease: Secondary | ICD-10-CM | POA: Diagnosis not present

## 2017-07-23 DIAGNOSIS — N2581 Secondary hyperparathyroidism of renal origin: Secondary | ICD-10-CM | POA: Diagnosis not present

## 2017-07-23 DIAGNOSIS — D509 Iron deficiency anemia, unspecified: Secondary | ICD-10-CM | POA: Diagnosis not present

## 2017-07-23 DIAGNOSIS — N186 End stage renal disease: Secondary | ICD-10-CM | POA: Diagnosis not present

## 2017-07-25 DIAGNOSIS — D631 Anemia in chronic kidney disease: Secondary | ICD-10-CM | POA: Diagnosis not present

## 2017-07-25 DIAGNOSIS — N2581 Secondary hyperparathyroidism of renal origin: Secondary | ICD-10-CM | POA: Diagnosis not present

## 2017-07-25 DIAGNOSIS — E1122 Type 2 diabetes mellitus with diabetic chronic kidney disease: Secondary | ICD-10-CM | POA: Diagnosis not present

## 2017-07-25 DIAGNOSIS — D509 Iron deficiency anemia, unspecified: Secondary | ICD-10-CM | POA: Diagnosis not present

## 2017-07-25 DIAGNOSIS — E1129 Type 2 diabetes mellitus with other diabetic kidney complication: Secondary | ICD-10-CM | POA: Diagnosis not present

## 2017-07-25 DIAGNOSIS — N186 End stage renal disease: Secondary | ICD-10-CM | POA: Diagnosis not present

## 2017-07-26 DIAGNOSIS — Z992 Dependence on renal dialysis: Secondary | ICD-10-CM | POA: Diagnosis not present

## 2017-07-26 DIAGNOSIS — N186 End stage renal disease: Secondary | ICD-10-CM | POA: Diagnosis not present

## 2017-07-26 DIAGNOSIS — E1122 Type 2 diabetes mellitus with diabetic chronic kidney disease: Secondary | ICD-10-CM | POA: Diagnosis not present

## 2017-07-27 DIAGNOSIS — E1129 Type 2 diabetes mellitus with other diabetic kidney complication: Secondary | ICD-10-CM | POA: Diagnosis not present

## 2017-07-27 DIAGNOSIS — N2581 Secondary hyperparathyroidism of renal origin: Secondary | ICD-10-CM | POA: Diagnosis not present

## 2017-07-27 DIAGNOSIS — N186 End stage renal disease: Secondary | ICD-10-CM | POA: Diagnosis not present

## 2017-07-27 DIAGNOSIS — E1122 Type 2 diabetes mellitus with diabetic chronic kidney disease: Secondary | ICD-10-CM | POA: Diagnosis not present

## 2017-07-27 DIAGNOSIS — D509 Iron deficiency anemia, unspecified: Secondary | ICD-10-CM | POA: Diagnosis not present

## 2017-07-27 DIAGNOSIS — Z992 Dependence on renal dialysis: Secondary | ICD-10-CM | POA: Diagnosis not present

## 2017-07-30 DIAGNOSIS — N186 End stage renal disease: Secondary | ICD-10-CM | POA: Diagnosis not present

## 2017-07-30 DIAGNOSIS — D509 Iron deficiency anemia, unspecified: Secondary | ICD-10-CM | POA: Diagnosis not present

## 2017-07-30 DIAGNOSIS — E1129 Type 2 diabetes mellitus with other diabetic kidney complication: Secondary | ICD-10-CM | POA: Diagnosis not present

## 2017-07-30 DIAGNOSIS — N2581 Secondary hyperparathyroidism of renal origin: Secondary | ICD-10-CM | POA: Diagnosis not present

## 2017-08-01 DIAGNOSIS — N186 End stage renal disease: Secondary | ICD-10-CM | POA: Diagnosis not present

## 2017-08-01 DIAGNOSIS — E1129 Type 2 diabetes mellitus with other diabetic kidney complication: Secondary | ICD-10-CM | POA: Diagnosis not present

## 2017-08-01 DIAGNOSIS — N2581 Secondary hyperparathyroidism of renal origin: Secondary | ICD-10-CM | POA: Diagnosis not present

## 2017-08-01 DIAGNOSIS — D509 Iron deficiency anemia, unspecified: Secondary | ICD-10-CM | POA: Diagnosis not present

## 2017-08-02 ENCOUNTER — Ambulatory Visit (INDEPENDENT_AMBULATORY_CARE_PROVIDER_SITE_OTHER): Payer: Medicare Other | Admitting: Pharmacist

## 2017-08-02 DIAGNOSIS — Z5181 Encounter for therapeutic drug level monitoring: Secondary | ICD-10-CM | POA: Diagnosis not present

## 2017-08-02 DIAGNOSIS — I482 Chronic atrial fibrillation, unspecified: Secondary | ICD-10-CM

## 2017-08-02 LAB — POCT INR: INR: 2.1

## 2017-08-02 NOTE — Patient Instructions (Signed)
Description   Continue on same dosage 1 tablet daily except 1.5  tablets on Mondays, Wednesdays, and Fridays.  Recheck INR in 5 weeks. [facility] [phone]

## 2017-08-03 DIAGNOSIS — N2581 Secondary hyperparathyroidism of renal origin: Secondary | ICD-10-CM | POA: Diagnosis not present

## 2017-08-03 DIAGNOSIS — N186 End stage renal disease: Secondary | ICD-10-CM | POA: Diagnosis not present

## 2017-08-03 DIAGNOSIS — E1129 Type 2 diabetes mellitus with other diabetic kidney complication: Secondary | ICD-10-CM | POA: Diagnosis not present

## 2017-08-03 DIAGNOSIS — D509 Iron deficiency anemia, unspecified: Secondary | ICD-10-CM | POA: Diagnosis not present

## 2017-08-07 DIAGNOSIS — N185 Chronic kidney disease, stage 5: Secondary | ICD-10-CM | POA: Diagnosis not present

## 2017-08-07 DIAGNOSIS — Z6826 Body mass index (BMI) 26.0-26.9, adult: Secondary | ICD-10-CM | POA: Diagnosis not present

## 2017-08-07 DIAGNOSIS — E1129 Type 2 diabetes mellitus with other diabetic kidney complication: Secondary | ICD-10-CM | POA: Diagnosis not present

## 2017-08-07 DIAGNOSIS — I350 Nonrheumatic aortic (valve) stenosis: Secondary | ICD-10-CM | POA: Diagnosis not present

## 2017-08-07 DIAGNOSIS — Z794 Long term (current) use of insulin: Secondary | ICD-10-CM | POA: Diagnosis not present

## 2017-08-07 DIAGNOSIS — M25561 Pain in right knee: Secondary | ICD-10-CM | POA: Diagnosis not present

## 2017-08-08 DIAGNOSIS — N186 End stage renal disease: Secondary | ICD-10-CM | POA: Diagnosis not present

## 2017-08-08 DIAGNOSIS — E1129 Type 2 diabetes mellitus with other diabetic kidney complication: Secondary | ICD-10-CM | POA: Diagnosis not present

## 2017-08-08 DIAGNOSIS — D509 Iron deficiency anemia, unspecified: Secondary | ICD-10-CM | POA: Diagnosis not present

## 2017-08-08 DIAGNOSIS — N2581 Secondary hyperparathyroidism of renal origin: Secondary | ICD-10-CM | POA: Diagnosis not present

## 2017-08-09 DIAGNOSIS — I10 Essential (primary) hypertension: Secondary | ICD-10-CM | POA: Diagnosis not present

## 2017-08-09 DIAGNOSIS — E1165 Type 2 diabetes mellitus with hyperglycemia: Secondary | ICD-10-CM | POA: Diagnosis not present

## 2017-08-09 DIAGNOSIS — Z794 Long term (current) use of insulin: Secondary | ICD-10-CM | POA: Diagnosis not present

## 2017-08-09 DIAGNOSIS — N186 End stage renal disease: Secondary | ICD-10-CM | POA: Diagnosis not present

## 2017-08-09 DIAGNOSIS — Z6826 Body mass index (BMI) 26.0-26.9, adult: Secondary | ICD-10-CM | POA: Diagnosis not present

## 2017-08-10 DIAGNOSIS — E1129 Type 2 diabetes mellitus with other diabetic kidney complication: Secondary | ICD-10-CM | POA: Diagnosis not present

## 2017-08-10 DIAGNOSIS — D509 Iron deficiency anemia, unspecified: Secondary | ICD-10-CM | POA: Diagnosis not present

## 2017-08-10 DIAGNOSIS — N2581 Secondary hyperparathyroidism of renal origin: Secondary | ICD-10-CM | POA: Diagnosis not present

## 2017-08-10 DIAGNOSIS — N186 End stage renal disease: Secondary | ICD-10-CM | POA: Diagnosis not present

## 2017-08-13 DIAGNOSIS — E1129 Type 2 diabetes mellitus with other diabetic kidney complication: Secondary | ICD-10-CM | POA: Diagnosis not present

## 2017-08-13 DIAGNOSIS — D509 Iron deficiency anemia, unspecified: Secondary | ICD-10-CM | POA: Diagnosis not present

## 2017-08-13 DIAGNOSIS — N2581 Secondary hyperparathyroidism of renal origin: Secondary | ICD-10-CM | POA: Diagnosis not present

## 2017-08-13 DIAGNOSIS — N186 End stage renal disease: Secondary | ICD-10-CM | POA: Diagnosis not present

## 2017-08-15 DIAGNOSIS — N2581 Secondary hyperparathyroidism of renal origin: Secondary | ICD-10-CM | POA: Diagnosis not present

## 2017-08-15 DIAGNOSIS — D509 Iron deficiency anemia, unspecified: Secondary | ICD-10-CM | POA: Diagnosis not present

## 2017-08-15 DIAGNOSIS — E1129 Type 2 diabetes mellitus with other diabetic kidney complication: Secondary | ICD-10-CM | POA: Diagnosis not present

## 2017-08-15 DIAGNOSIS — N186 End stage renal disease: Secondary | ICD-10-CM | POA: Diagnosis not present

## 2017-08-17 DIAGNOSIS — D509 Iron deficiency anemia, unspecified: Secondary | ICD-10-CM | POA: Diagnosis not present

## 2017-08-17 DIAGNOSIS — N2581 Secondary hyperparathyroidism of renal origin: Secondary | ICD-10-CM | POA: Diagnosis not present

## 2017-08-17 DIAGNOSIS — N186 End stage renal disease: Secondary | ICD-10-CM | POA: Diagnosis not present

## 2017-08-17 DIAGNOSIS — E1129 Type 2 diabetes mellitus with other diabetic kidney complication: Secondary | ICD-10-CM | POA: Diagnosis not present

## 2017-08-20 DIAGNOSIS — N186 End stage renal disease: Secondary | ICD-10-CM | POA: Diagnosis not present

## 2017-08-20 DIAGNOSIS — N2581 Secondary hyperparathyroidism of renal origin: Secondary | ICD-10-CM | POA: Diagnosis not present

## 2017-08-20 DIAGNOSIS — E1129 Type 2 diabetes mellitus with other diabetic kidney complication: Secondary | ICD-10-CM | POA: Diagnosis not present

## 2017-08-20 DIAGNOSIS — D509 Iron deficiency anemia, unspecified: Secondary | ICD-10-CM | POA: Diagnosis not present

## 2017-08-22 DIAGNOSIS — N2581 Secondary hyperparathyroidism of renal origin: Secondary | ICD-10-CM | POA: Diagnosis not present

## 2017-08-22 DIAGNOSIS — E1129 Type 2 diabetes mellitus with other diabetic kidney complication: Secondary | ICD-10-CM | POA: Diagnosis not present

## 2017-08-22 DIAGNOSIS — D509 Iron deficiency anemia, unspecified: Secondary | ICD-10-CM | POA: Diagnosis not present

## 2017-08-22 DIAGNOSIS — N186 End stage renal disease: Secondary | ICD-10-CM | POA: Diagnosis not present

## 2017-08-24 DIAGNOSIS — Z992 Dependence on renal dialysis: Secondary | ICD-10-CM | POA: Diagnosis not present

## 2017-08-24 DIAGNOSIS — E1122 Type 2 diabetes mellitus with diabetic chronic kidney disease: Secondary | ICD-10-CM | POA: Diagnosis not present

## 2017-08-24 DIAGNOSIS — N186 End stage renal disease: Secondary | ICD-10-CM | POA: Diagnosis not present

## 2017-08-24 DIAGNOSIS — E1129 Type 2 diabetes mellitus with other diabetic kidney complication: Secondary | ICD-10-CM | POA: Diagnosis not present

## 2017-08-24 DIAGNOSIS — D509 Iron deficiency anemia, unspecified: Secondary | ICD-10-CM | POA: Diagnosis not present

## 2017-08-24 DIAGNOSIS — N2581 Secondary hyperparathyroidism of renal origin: Secondary | ICD-10-CM | POA: Diagnosis not present

## 2017-08-27 DIAGNOSIS — N2581 Secondary hyperparathyroidism of renal origin: Secondary | ICD-10-CM | POA: Diagnosis not present

## 2017-08-27 DIAGNOSIS — N186 End stage renal disease: Secondary | ICD-10-CM | POA: Diagnosis not present

## 2017-08-27 DIAGNOSIS — E1129 Type 2 diabetes mellitus with other diabetic kidney complication: Secondary | ICD-10-CM | POA: Diagnosis not present

## 2017-08-27 DIAGNOSIS — D509 Iron deficiency anemia, unspecified: Secondary | ICD-10-CM | POA: Diagnosis not present

## 2017-08-31 DIAGNOSIS — N186 End stage renal disease: Secondary | ICD-10-CM | POA: Diagnosis not present

## 2017-08-31 DIAGNOSIS — D509 Iron deficiency anemia, unspecified: Secondary | ICD-10-CM | POA: Diagnosis not present

## 2017-08-31 DIAGNOSIS — E1129 Type 2 diabetes mellitus with other diabetic kidney complication: Secondary | ICD-10-CM | POA: Diagnosis not present

## 2017-08-31 DIAGNOSIS — N2581 Secondary hyperparathyroidism of renal origin: Secondary | ICD-10-CM | POA: Diagnosis not present

## 2017-09-03 DIAGNOSIS — N2581 Secondary hyperparathyroidism of renal origin: Secondary | ICD-10-CM | POA: Diagnosis not present

## 2017-09-03 DIAGNOSIS — N186 End stage renal disease: Secondary | ICD-10-CM | POA: Diagnosis not present

## 2017-09-03 DIAGNOSIS — E1129 Type 2 diabetes mellitus with other diabetic kidney complication: Secondary | ICD-10-CM | POA: Diagnosis not present

## 2017-09-03 DIAGNOSIS — D509 Iron deficiency anemia, unspecified: Secondary | ICD-10-CM | POA: Diagnosis not present

## 2017-09-06 ENCOUNTER — Ambulatory Visit (INDEPENDENT_AMBULATORY_CARE_PROVIDER_SITE_OTHER): Payer: Medicare Other | Admitting: Pharmacist

## 2017-09-06 DIAGNOSIS — I482 Chronic atrial fibrillation, unspecified: Secondary | ICD-10-CM

## 2017-09-06 DIAGNOSIS — Z5181 Encounter for therapeutic drug level monitoring: Secondary | ICD-10-CM

## 2017-09-06 LAB — POCT INR: INR: 2.1

## 2017-09-06 NOTE — Patient Instructions (Signed)
Description   Continue on same dosage 1 tablet daily except 1.5  tablets on Mondays, Wednesdays, and Fridays.  Recheck INR in 6 weeks. [facility] [phone]

## 2017-09-07 DIAGNOSIS — N2581 Secondary hyperparathyroidism of renal origin: Secondary | ICD-10-CM | POA: Diagnosis not present

## 2017-09-07 DIAGNOSIS — E1129 Type 2 diabetes mellitus with other diabetic kidney complication: Secondary | ICD-10-CM | POA: Diagnosis not present

## 2017-09-07 DIAGNOSIS — N186 End stage renal disease: Secondary | ICD-10-CM | POA: Diagnosis not present

## 2017-09-07 DIAGNOSIS — D509 Iron deficiency anemia, unspecified: Secondary | ICD-10-CM | POA: Diagnosis not present

## 2017-09-10 DIAGNOSIS — N186 End stage renal disease: Secondary | ICD-10-CM | POA: Diagnosis not present

## 2017-09-10 DIAGNOSIS — D509 Iron deficiency anemia, unspecified: Secondary | ICD-10-CM | POA: Diagnosis not present

## 2017-09-10 DIAGNOSIS — E1129 Type 2 diabetes mellitus with other diabetic kidney complication: Secondary | ICD-10-CM | POA: Diagnosis not present

## 2017-09-10 DIAGNOSIS — N2581 Secondary hyperparathyroidism of renal origin: Secondary | ICD-10-CM | POA: Diagnosis not present

## 2017-09-11 ENCOUNTER — Ambulatory Visit (INDEPENDENT_AMBULATORY_CARE_PROVIDER_SITE_OTHER)
Admission: RE | Admit: 2017-09-11 | Discharge: 2017-09-11 | Disposition: A | Discharge status: Home / Self Care | Payer: Medicare Other | Source: Ambulatory Visit | Attending: Family | Admitting: Family

## 2017-09-11 ENCOUNTER — Ambulatory Visit (INDEPENDENT_AMBULATORY_CARE_PROVIDER_SITE_OTHER): Payer: Medicare Other | Admitting: Family

## 2017-09-11 ENCOUNTER — Encounter: Payer: Self-pay | Admitting: Family

## 2017-09-11 ENCOUNTER — Other Ambulatory Visit: Payer: Self-pay

## 2017-09-11 ENCOUNTER — Ambulatory Visit (HOSPITAL_COMMUNITY)
Admission: RE | Admit: 2017-09-11 | Discharge: 2017-09-11 | Disposition: A | Discharge status: Home / Self Care | Payer: Medicare Other | Source: Ambulatory Visit | Attending: Family | Admitting: Family

## 2017-09-11 VITALS — BP 133/59 | HR 67 | Temp 98.5°F | Resp 20 | Ht 70.0 in | Wt 170.6 lb

## 2017-09-11 DIAGNOSIS — I779 Disorder of arteries and arterioles, unspecified: Secondary | ICD-10-CM | POA: Insufficient documentation

## 2017-09-11 DIAGNOSIS — Z9889 Other specified postprocedural states: Secondary | ICD-10-CM | POA: Diagnosis not present

## 2017-09-11 DIAGNOSIS — I6523 Occlusion and stenosis of bilateral carotid arteries: Secondary | ICD-10-CM | POA: Diagnosis not present

## 2017-09-11 DIAGNOSIS — N186 End stage renal disease: Secondary | ICD-10-CM | POA: Diagnosis not present

## 2017-09-11 DIAGNOSIS — Z992 Dependence on renal dialysis: Secondary | ICD-10-CM | POA: Diagnosis not present

## 2017-09-11 NOTE — Progress Notes (Signed)
[facility] [place] HISTORY AND PHYSICAL   CC: Follow up extracranial carotid artery stenosis and peripheral artery occlusive disease   History of Present Illness:   [name] is a 84 y.o. male who is s/p left brachiocephalic AV fistula creation on 01/21/13 by Dr. Scot Dock. He is also s/p Right carotid endarterectomy 01/29/2001; Left common carotid artery stenosis repair with interposition graft 12/30/1999; Left carotid patch angioplasty 12/01/1998; Left carotid endarterectomy 04/30/1989. He returns today for carotid artery stenosis follow up.   He denies any history of claudication, although his activity seems fairly limited. He denies any history of rest pain. He is non smoker.  He remains asymptomatic from a carotid standpoint. He denies any tingling, numbness, or aching in either hand or arm.  He dialyzes M-W-F via left upper arm AV fistula.   He denies any history of stroke or TIA. Specifically he denies any history of hemiparesis, denies symptoms of aphasia, denies monocular vision loss, denies unilateral facial drooping.  He was hospitalized at [facility] in March 2017 for fluid overload, started HD then. He was in rehab after this and developed a left heel decubitus ulcer during his stay there.His podiatrist, Dr. Amalia Hailey, was treating his left heel ulcer which has healed, and a callus remains.     Wife states he ambulates with a walker at home. She states he is also unsteady on his feet. He has a history of shingles on his right leg.  He has dementia.   He had a cardioversion early September, 2015.  Pt Diabetic: Yes, wife states last A1C was 5.?, in good control  Pt smoker: non-smoker  Pt meds include: Statin : Yes ASA: No Other anticoagulants/antiplatelets: coumadin for atrial fib     Current Outpatient Medications  Medication Sig Dispense Refill  . acetaminophen (TYLENOL) 325 MG tablet Take 2 tablets (650 mg total) by mouth every 4  (four) hours as needed for headache or mild pain.    Marland Kitchen atorvastatin (LIPITOR) 80 MG tablet TAKE ONE TABLET BY MOUTH ONCE DAILY 90 tablet 3  . calcium acetate (PHOSLO) 667 MG capsule Take 1,334 mg by mouth 3 (three) times daily with meals.    . cholecalciferol (VITAMIN D) 1000 UNITS tablet Take 1,000 Units by mouth daily at 6 PM. D3    . divalproex (DEPAKOTE SPRINKLE) 125 MG capsule Take 125 mg by mouth daily.     . insulin aspart (NOVOLOG FLEXPEN) 100 UNIT/ML FlexPen Inject 8 Units into the skin 2 (two) times daily before a meal.    . Insulin Glargine (LANTUS SOLOSTAR) 100 UNIT/ML SOPN Inject 24 Units into the skin every morning.     Marland Kitchen levothyroxine (SYNTHROID, LEVOTHROID) 175 MCG tablet Take 175 mcg by mouth daily before breakfast.    . memantine (NAMENDA) 5 MG tablet Take 5 mg by mouth daily.     . Multiple Vitamins-Minerals (DECUBI-VITE) CAPS Take 1 capsule by mouth daily.    . multivitamin (RENA-VIT) TABS tablet Take 1 tablet by mouth daily.    . mupirocin ointment (BACTROBAN) 2 % Place 1 application into the nose 2 (two) times daily. 22 g 0  . nitroGLYCERIN (NITROSTAT) 0.4 MG SL tablet Place 1 tablet (0.4 mg total) under the tongue every 5 (five) minutes as needed for chest pain. 30 tablet 12  . polyethylene glycol (MIRALAX / GLYCOLAX) packet Take 17 g by mouth daily as needed for mild constipation.    . ranitidine (ZANTAC) 300 MG tablet Take 300 mg by  mouth daily.    . silver sulfADIAZINE (SILVADENE) 1 % cream Apply 1 application topically daily. 50 g 0  . warfarin (COUMADIN) 5 MG tablet Take 5-7.5 mg by mouth See admin instructions. Take 1 1/2 (7.5mg ) tablet on M, W, F, and 1 tablet (5mg ) on Sun, T, Th, Sat    . warfarin (COUMADIN) 5 MG tablet USE AS DIRECTED 120 tablet 1   No current facility-administered medications for this visit.     Past Medical History:  Diagnosis Date  . Atrial fibrillation ([place]) Aug. 2015   a. Dx 01/2014, s/p DCCV 03/06/14.  Marland Kitchen CAD (coronary artery disease)     a. prior CABG in 1997 with redo in 2000. b. last cath in 2008 - managed medically  . Carotid disease, bilateral ([place])    with multiple bilateral carotid surgeries  . Chronic diastolic CHF (congestive heart failure) ([place])   . Chronic respiratory failure with hypoxia ([place])   . CKD (chronic kidney disease), stage IV ([place])    a. Has fistula in place.   . Degenerative joint disease   . Dementia   . Generalized weakness    without overt findings  . GERD (gastroesophageal reflux disease)   . Gout   . Heart murmur   . Hyperlipidemia   . Hypertension   . Hypothyroidism   . Meniere's disease   . On home oxygen therapy    "2L" qhs (09/09/2015  . Orthopnea    Two-pillow  . Peripheral vascular disease ([place])   . Psoriasis   . Shingles   . Sinus bradycardia    a. Toprol D/C'd due to this.   . Type 2 diabetes mellitus ([place])     Social History Social History   Tobacco Use  . Smoking status: Never Smoker  . Smokeless tobacco: Never Used  Substance Use Topics  . Alcohol use: No    Alcohol/week: 0.0 oz  . Drug use: No    Family History Family History  Problem Relation Age of Onset  . Heart attack Father   . Heart disease Father        After 53 yrs of age  . Hyperlipidemia Father   . Hypertension Father   . Diabetes Mother   . Hypertension Mother   . Heart disease Mother   . Heart disease Brother   . Diabetes Brother     Surgical History Past Surgical History:  Procedure Laterality Date  . APPENDECTOMY    . AV FISTULA PLACEMENT Left 01/21/2013   Procedure: ARTERIOVENOUS (AV) FISTULA CREATION, Left Brachiocephalic;  Surgeon: Angelia Mould, MD;  Location: [place];  Service: Vascular;  Laterality: Left;  . BLEPHAROPLASTY Bilateral   . CARDIAC CATHETERIZATION    . CARDIAC CATHETERIZATION  2008   L main irreg, LAD 80%, IMA-LAD & SVG-Diag patent, CFX 100%, SVG-OM 90%, RCA 70%, SVG-RCA OK, EF nl, med rx, no vessels appropriate for PCI  . CARDIAC CATHETERIZATION N/A  01/12/2016   Procedure: Left Heart Cath and Cors/Grafts Angiography;  Surgeon: Belva Crome, MD;  Location: [place];  Service: Cardiovascular;  Laterality: N/A;  . CARDIOVERSION N/A 03/06/2014   Procedure: CARDIOVERSION;  Surgeon: Larey Dresser, MD;  Location: [facility];  Service: Cardiovascular;  Laterality: N/A;  . CAROTID ENDARTERECTOMY Bilateral "several times"   "5 on right; 2-3 on left" (09/09/2015)  . CORONARY ARTERY BYPASS GRAFT  1997;  2002  . FRACTURE SURGERY    . LUMBAR LAMINECTOMY  July 2001  . LUNG  REMOVAL, PARTIAL    . SHOULDER SURGERY Bilateral   . TEE WITHOUT CARDIOVERSION N/A 03/06/2014   Procedure: TRANSESOPHAGEAL ECHOCARDIOGRAM (TEE);  Surgeon: Larey Dresser, MD;  Location: [facility] END[facility];  Service: Cardiovascular;  Laterality: N/A;  . THORACOTOMY Left    due to fungal infection    Allergies  Allergen Reactions  . Betadine [Povidone Iodine] Rash  . Iodinated Diagnostic Agents Rash and Other (See Comments)    Does fine with premedications for cath  . Latex Hives  . Tape Rash    MUST BE FREE OF ANY LATEX    Current Outpatient Medications  Medication Sig Dispense Refill  . acetaminophen (TYLENOL) 325 MG tablet Take 2 tablets (650 mg total) by mouth every 4 (four) hours as needed for headache or mild pain.    Marland Kitchen atorvastatin (LIPITOR) 80 MG tablet TAKE ONE TABLET BY MOUTH ONCE DAILY 90 tablet 3  . calcium acetate (PHOSLO) 667 MG capsule Take 1,334 mg by mouth 3 (three) times daily with meals.    . cholecalciferol (VITAMIN D) 1000 UNITS tablet Take 1,000 Units by mouth daily at 6 PM. D3    . divalproex (DEPAKOTE SPRINKLE) 125 MG capsule Take 125 mg by mouth daily.     . insulin aspart (NOVOLOG FLEXPEN) 100 UNIT/ML FlexPen Inject 8 Units into the skin 2 (two) times daily before a meal.    . Insulin Glargine (LANTUS SOLOSTAR) 100 UNIT/ML SOPN Inject 24 Units into the skin every morning.     Marland Kitchen levothyroxine (SYNTHROID, LEVOTHROID) 175 MCG tablet Take 175 mcg  by mouth daily before breakfast.    . memantine (NAMENDA) 5 MG tablet Take 5 mg by mouth daily.     . Multiple Vitamins-Minerals (DECUBI-VITE) CAPS Take 1 capsule by mouth daily.    . multivitamin (RENA-VIT) TABS tablet Take 1 tablet by mouth daily.    . mupirocin ointment (BACTROBAN) 2 % Place 1 application into the nose 2 (two) times daily. 22 g 0  . nitroGLYCERIN (NITROSTAT) 0.4 MG SL tablet Place 1 tablet (0.4 mg total) under the tongue every 5 (five) minutes as needed for chest pain. 30 tablet 12  . polyethylene glycol (MIRALAX / GLYCOLAX) packet Take 17 g by mouth daily as needed for mild constipation.    . ranitidine (ZANTAC) 300 MG tablet Take 300 mg by mouth daily.    . silver sulfADIAZINE (SILVADENE) 1 % cream Apply 1 application topically daily. 50 g 0  . warfarin (COUMADIN) 5 MG tablet Take 5-7.5 mg by mouth See admin instructions. Take 1 1/2 (7.5mg ) tablet on M, W, F, and 1 tablet (5mg ) on Sun, T, Th, Sat    . warfarin (COUMADIN) 5 MG tablet USE AS DIRECTED 120 tablet 1   No current facility-administered medications for this visit.      REVIEW OF SYSTEMS: See HPI for pertinent positives and negatives.  Physical Examination Vitals:   09/11/17 1524  BP: (!) 133/59  Pulse: 67  Resp: 20  Temp: 98.5 F (36.9 C)  TempSrc: Oral  SpO2: 97%  Weight: 170 lb 9.6 oz (77.4 kg)  Height: 5\' 10"  (1.778 m)   Body mass index is 24.48 kg/m.  General:  WDWN elderly male in NAD, with wife GAIT:using walker, slow, deliberate.  HENT: No gross abnormalities  Eyes: PERRLA Pulmonary: Respirations are non-labored, CTAB Cardiac: regular rhythm and rate,+ murmur.  VASCULAR EXAM Carotid Bruits Right Left   Positive Positive    Abdominal aortic pulse is not palpable. Radial pulses:  right is 2+ palpable, left is 1+ palpable.Left upper arm AVF has a palpable thrill, is moderately aneurysmal.       LE Pulses Right Left   FEMORAL 2+ palpable 2+ palpable    POPLITEAL not palpable  not palpable   POSTERIOR TIBIAL not palpable  not palpable   DORSALIS PEDIS  ANTERIOR TIBIAL not palpable  1+ palpable    Gastrointestinal: soft, nontender, BS WNL, no r/g, small reducible ventral hernia. Musculoskeletal: Generalized muscle atrophy/wasting/deconditioning. M/S 4/5 throughout. Moderate thoracic kyphosis.  Skin: No rash, no ulcer, no cellulitis. Small callus at posterior aspect left heel.  Neurologic: A&O X 2; appropriate affect,speech is normal, CN 2-12 intact except has some hearing loss, Pain and light touch intact in extremities, Motor exam as listed above Psychiatric: Normal thought content, mood appropriate to clinical situation.     ASSESSMENT:  [name] is a 84 y.o. male who is s/p left brachiocephalic AV fistula on 7/82/42.  He is also s/p right carotid endarterectomy 01/29/2001; Left common carotid artery stenosis repair with interposition graft 12/30/1999; Left carotid patch angioplasty 12/01/1998; Left carotid endarterectomy 04/30/1989.  He was having some work done by his podiatrist who was concerned about arterial perfusion to his feet.  Femoral pulses are 2+ palpable. Pedal pulses are Dopplerable but not palpable. Feet and toes are pink and warm.  He denies any history of claudication, although his activity seems fairly limited. He denies any history of rest pain His left posterior heel ulcer has healed with a callus there.  He is a non smoker.   He started HD in his left upper arm AV fistula in March of 2017 when hospitalized for fluid overload. He dialyzes M-W-F.   He has no history of stroke or TIA. He has mild tingling in his left hand, 1+ palpable left radial pulse, no signs of ischemia in left  hand or fingers.    DATA  Carotid Duplex (09/11/17): 40-59% right internal carotid artery stenosis.  1-39% left internal carotid artery stenosis. Known left external carotid artery occlusion. Bilateral vertebral artery flow is antegrade. Right subclavian artery waveforms are multiphasic, left arm with AV fistula.  Decreased category of stenosis in the right ICA compared to the exam on 03-13-17.    ABI (Date: 09/11/2017):  R:   ABI: 0.66 (was 0.80 on 03-13-17),   PT: unable to obtain  DP: mono  TBI:  0.58 (was 0.28)  L:   ABI: 0.86 (was 0.66),   PT: mono  DP: mono  TBI: 0.53 (was 0.33)  Again, ABI's are unreliable as the waveform morphology does not correspond to the ABI's. Unable to obtain right PT waveform.   Improvement in bilateral TBI.    PLAN:   Based on today's exam and non-invasive vascular lab results, the patient will follow up in 1 year with the following tests: carotid duplex and ABI's.  I advised him and his wife to notify us if he develops concerns re the circulation in his feet or legs.  Daily seated leg exercises reviewed with pt and wife.  I discussed in depth with the patient the nature of atherosclerosis, and emphasized the importance of maximal medical management including strict control of blood pressure, blood glucose, and lipid levels, obtaining regular exercise, and cessation of smoking.  The patient is aware that without maximal medical management the underlying atherosclerotic disease process will progress, limiting the benefit of any interventions.  The patient was given information about stroke prevention and what symptoms should  prompt the patient to seek immediate medical care.  The patient was given information about PAD including signs, symptoms, treatment, what symptoms should prompt the patient to seek immediate medical care, and risk reduction measures to take.  Thank you for allowing [place] to participate in this patient's  care.  Clemon Chambers, RN, MSN, FNP-C [facility] Office: [phone]  Clinic MD: Early 09/11/2017 3:38 PM

## 2017-09-11 NOTE — Patient Instructions (Signed)
Peripheral Vascular Disease Peripheral vascular disease (PVD) is a disease of the blood vessels that are not part of your heart and brain. A simple term for PVD is poor circulation. In most cases, PVD narrows the blood vessels that carry blood from your heart to the rest of your body. This can result in a decreased supply of blood to your arms, legs, and internal organs, like your stomach or kidneys. However, it most often affects a person's lower legs and feet. There are two types of PVD.  Organic PVD. This is the more common type. It is caused by damage to the structure of blood vessels.  Functional PVD. This is caused by conditions that make blood vessels contract and tighten (spasm).  Without treatment, PVD tends to get worse over time. PVD can also lead to acute ischemic limb. This is when an arm or limb suddenly has trouble getting enough blood. This is a medical emergency. Follow these instructions at home:  Take medicines only as told by your doctor.  Do not use any tobacco products, including cigarettes, chewing tobacco, or electronic cigarettes. If you need help quitting, ask your doctor.  Lose weight if you are overweight, and maintain a healthy weight as told by your doctor.  Eat a diet that is low in fat and cholesterol. If you need help, ask your doctor.  Exercise regularly. Ask your doctor for some good activities for you.  Take good care of your feet. ? Wear comfortable shoes that fit well. ? Check your feet often for any cuts or sores. Contact a doctor if:  You have cramps in your legs while walking.  You have leg pain when you are at rest.  You have coldness in a leg or foot.  Your skin changes.  You are unable to get or have an erection (erectile dysfunction).  You have cuts or sores on your feet that are not healing. Get help right away if:  Your arm or leg turns cold and blue.  Your arms or legs become red, warm, swollen, painful, or numb.  You have  chest pain or trouble breathing.  You suddenly have weakness in your face, arm, or leg.  You become very confused or you cannot speak.  You suddenly have a very bad headache.  You suddenly cannot see. This information is not intended to replace advice given to you by your health care provider. Make sure you discuss any questions you have with your health care provider. Document Released: 09/06/2009 Document Revised: 11/18/2015 Document Reviewed: 11/20/2013 Elsevier Interactive Patient Education  2017 Elsevier Inc[employer]       Stroke Prevention Some health problems and behaviors may make it more likely for you to have a stroke. Below are ways to lessen your risk of having a stroke.  Be active for at least 30 minutes on most or all days.  Do not smoke. Try not to be around others who smoke.  Do not drink too much alcohol. ? Do not have more than 2 drinks a day if you are a man. ? Do not have more than 1 drink a day if you are a woman and are not pregnant.  Eat healthy foods, such as fruits and vegetables. If you were put on a specific diet, follow the diet as told.  Keep your cholesterol levels under control through diet and medicines. Look for foods that are low in saturated fat, trans fat, cholesterol, and are high in fiber.  If you have diabetes, follow   all diet plans and take your medicine as told.  Ask your doctor if you need treatment to lower your blood pressure. If you have high blood pressure (hypertension), follow all diet plans and take your medicine as told by your doctor.  If you are 18-39 years old, have your blood pressure checked every 3-5 years. If you are age 40 or older, have your blood pressure checked every year.  Keep a healthy weight. Eat foods that are low in calories, salt, saturated fat, trans fat, and cholesterol.  Do not take drugs.  Avoid birth control pills, if this applies. Talk to your doctor about the risks of taking birth control pills.  Talk to  your doctor if you have sleep problems (sleep apnea).  Take all medicine as told by your doctor. ? You may be told to take aspirin or blood thinner medicine. Take this medicine as told by your doctor. ? Understand your medicine instructions.  Make sure any other conditions you have are being taken care of.  Get help right away if:  You suddenly lose feeling (you feel numb) or have weakness in your face, arm, or leg.  Your face or eyelid hangs down to one side.  You suddenly feel confused.  You have trouble talking (aphasia) or understanding what people are saying.  You suddenly have trouble seeing in one or both eyes.  You suddenly have trouble walking.  You are dizzy.  You lose your balance or your movements are clumsy (uncoordinated).  You suddenly have a very bad headache and you do not know the cause.  You have new chest pain.  Your heart feels like it is fluttering or skipping a beat (irregular heartbeat). Do not wait to see if the symptoms above go away. Get help right away. Call your local emergency services (911 in [place] Do not drive yourself to the hospital. This information is not intended to replace advice given to you by your health care provider. Make sure you discuss any questions you have with your health care provider. Document Released: 12/12/2011 Document Revised: 11/18/2015 Document Reviewed: 12/13/2012 Elsevier Interactive Patient Education  2018 Elsevier Inc.  

## 2017-09-12 DIAGNOSIS — N2581 Secondary hyperparathyroidism of renal origin: Secondary | ICD-10-CM | POA: Diagnosis not present

## 2017-09-12 DIAGNOSIS — E1129 Type 2 diabetes mellitus with other diabetic kidney complication: Secondary | ICD-10-CM | POA: Diagnosis not present

## 2017-09-12 DIAGNOSIS — N186 End stage renal disease: Secondary | ICD-10-CM | POA: Diagnosis not present

## 2017-09-12 DIAGNOSIS — D509 Iron deficiency anemia, unspecified: Secondary | ICD-10-CM | POA: Diagnosis not present

## 2017-09-13 DIAGNOSIS — I350 Nonrheumatic aortic (valve) stenosis: Secondary | ICD-10-CM | POA: Diagnosis not present

## 2017-09-13 DIAGNOSIS — I480 Paroxysmal atrial fibrillation: Secondary | ICD-10-CM | POA: Diagnosis not present

## 2017-09-13 DIAGNOSIS — I2510 Atherosclerotic heart disease of native coronary artery without angina pectoris: Secondary | ICD-10-CM | POA: Diagnosis not present

## 2017-09-13 DIAGNOSIS — N186 End stage renal disease: Secondary | ICD-10-CM | POA: Diagnosis not present

## 2017-09-13 DIAGNOSIS — R0609 Other forms of dyspnea: Secondary | ICD-10-CM | POA: Diagnosis not present

## 2017-09-13 DIAGNOSIS — Z6825 Body mass index (BMI) 25.0-25.9, adult: Secondary | ICD-10-CM | POA: Diagnosis not present

## 2017-09-14 DIAGNOSIS — E1129 Type 2 diabetes mellitus with other diabetic kidney complication: Secondary | ICD-10-CM | POA: Diagnosis not present

## 2017-09-14 DIAGNOSIS — N186 End stage renal disease: Secondary | ICD-10-CM | POA: Diagnosis not present

## 2017-09-14 DIAGNOSIS — D509 Iron deficiency anemia, unspecified: Secondary | ICD-10-CM | POA: Diagnosis not present

## 2017-09-14 DIAGNOSIS — N2581 Secondary hyperparathyroidism of renal origin: Secondary | ICD-10-CM | POA: Diagnosis not present

## 2017-09-19 DIAGNOSIS — D509 Iron deficiency anemia, unspecified: Secondary | ICD-10-CM | POA: Diagnosis not present

## 2017-09-19 DIAGNOSIS — E1129 Type 2 diabetes mellitus with other diabetic kidney complication: Secondary | ICD-10-CM | POA: Diagnosis not present

## 2017-09-19 DIAGNOSIS — N186 End stage renal disease: Secondary | ICD-10-CM | POA: Diagnosis not present

## 2017-09-19 DIAGNOSIS — N2581 Secondary hyperparathyroidism of renal origin: Secondary | ICD-10-CM | POA: Diagnosis not present

## 2017-09-21 DIAGNOSIS — N2581 Secondary hyperparathyroidism of renal origin: Secondary | ICD-10-CM | POA: Diagnosis not present

## 2017-09-21 DIAGNOSIS — N186 End stage renal disease: Secondary | ICD-10-CM | POA: Diagnosis not present

## 2017-09-21 DIAGNOSIS — E1129 Type 2 diabetes mellitus with other diabetic kidney complication: Secondary | ICD-10-CM | POA: Diagnosis not present

## 2017-09-21 DIAGNOSIS — D509 Iron deficiency anemia, unspecified: Secondary | ICD-10-CM | POA: Diagnosis not present

## 2017-09-24 DIAGNOSIS — N186 End stage renal disease: Secondary | ICD-10-CM | POA: Diagnosis not present

## 2017-09-24 DIAGNOSIS — E1122 Type 2 diabetes mellitus with diabetic chronic kidney disease: Secondary | ICD-10-CM | POA: Diagnosis not present

## 2017-09-24 DIAGNOSIS — Z992 Dependence on renal dialysis: Secondary | ICD-10-CM | POA: Diagnosis not present

## 2017-09-26 DIAGNOSIS — N2581 Secondary hyperparathyroidism of renal origin: Secondary | ICD-10-CM | POA: Diagnosis not present

## 2017-09-26 DIAGNOSIS — E1129 Type 2 diabetes mellitus with other diabetic kidney complication: Secondary | ICD-10-CM | POA: Diagnosis not present

## 2017-09-26 DIAGNOSIS — R319 Hematuria, unspecified: Secondary | ICD-10-CM | POA: Diagnosis not present

## 2017-09-26 DIAGNOSIS — D509 Iron deficiency anemia, unspecified: Secondary | ICD-10-CM | POA: Diagnosis not present

## 2017-09-26 DIAGNOSIS — E1122 Type 2 diabetes mellitus with diabetic chronic kidney disease: Secondary | ICD-10-CM | POA: Diagnosis not present

## 2017-09-26 DIAGNOSIS — D631 Anemia in chronic kidney disease: Secondary | ICD-10-CM | POA: Diagnosis not present

## 2017-09-26 DIAGNOSIS — N186 End stage renal disease: Secondary | ICD-10-CM | POA: Diagnosis not present

## 2017-09-28 DIAGNOSIS — N186 End stage renal disease: Secondary | ICD-10-CM | POA: Diagnosis not present

## 2017-09-28 DIAGNOSIS — R319 Hematuria, unspecified: Secondary | ICD-10-CM | POA: Diagnosis not present

## 2017-09-28 DIAGNOSIS — E1122 Type 2 diabetes mellitus with diabetic chronic kidney disease: Secondary | ICD-10-CM | POA: Diagnosis not present

## 2017-09-28 DIAGNOSIS — D509 Iron deficiency anemia, unspecified: Secondary | ICD-10-CM | POA: Diagnosis not present

## 2017-09-28 DIAGNOSIS — D631 Anemia in chronic kidney disease: Secondary | ICD-10-CM | POA: Diagnosis not present

## 2017-09-28 DIAGNOSIS — E1129 Type 2 diabetes mellitus with other diabetic kidney complication: Secondary | ICD-10-CM | POA: Diagnosis not present

## 2017-10-01 DIAGNOSIS — R319 Hematuria, unspecified: Secondary | ICD-10-CM | POA: Diagnosis not present

## 2017-10-01 DIAGNOSIS — E1122 Type 2 diabetes mellitus with diabetic chronic kidney disease: Secondary | ICD-10-CM | POA: Diagnosis not present

## 2017-10-01 DIAGNOSIS — E1129 Type 2 diabetes mellitus with other diabetic kidney complication: Secondary | ICD-10-CM | POA: Diagnosis not present

## 2017-10-01 DIAGNOSIS — N186 End stage renal disease: Secondary | ICD-10-CM | POA: Diagnosis not present

## 2017-10-01 DIAGNOSIS — D631 Anemia in chronic kidney disease: Secondary | ICD-10-CM | POA: Diagnosis not present

## 2017-10-01 DIAGNOSIS — D509 Iron deficiency anemia, unspecified: Secondary | ICD-10-CM | POA: Diagnosis not present

## 2017-10-02 ENCOUNTER — Encounter: Payer: Self-pay | Admitting: Podiatry

## 2017-10-02 ENCOUNTER — Ambulatory Visit (INDEPENDENT_AMBULATORY_CARE_PROVIDER_SITE_OTHER): Payer: Medicare Other | Admitting: Podiatry

## 2017-10-02 DIAGNOSIS — M79676 Pain in unspecified toe(s): Secondary | ICD-10-CM | POA: Diagnosis not present

## 2017-10-02 DIAGNOSIS — Q828 Other specified congenital malformations of skin: Secondary | ICD-10-CM | POA: Diagnosis not present

## 2017-10-02 DIAGNOSIS — L539 Erythematous condition, unspecified: Secondary | ICD-10-CM | POA: Diagnosis not present

## 2017-10-02 DIAGNOSIS — I779 Disorder of arteries and arterioles, unspecified: Secondary | ICD-10-CM | POA: Diagnosis not present

## 2017-10-02 DIAGNOSIS — B351 Tinea unguium: Secondary | ICD-10-CM

## 2017-10-02 DIAGNOSIS — D689 Coagulation defect, unspecified: Secondary | ICD-10-CM | POA: Diagnosis not present

## 2017-10-02 DIAGNOSIS — L97501 Non-pressure chronic ulcer of other part of unspecified foot limited to breakdown of skin: Secondary | ICD-10-CM | POA: Diagnosis not present

## 2017-10-02 MED ORDER — CLINDAMYCIN HCL 300 MG PO CAPS: 300.0000 mg | ORAL_CAPSULE | Freq: Three times a day (TID) | ORAL | 0 refills | Status: DC

## 2017-10-03 DIAGNOSIS — N186 End stage renal disease: Secondary | ICD-10-CM | POA: Diagnosis not present

## 2017-10-03 DIAGNOSIS — E1129 Type 2 diabetes mellitus with other diabetic kidney complication: Secondary | ICD-10-CM | POA: Diagnosis not present

## 2017-10-03 DIAGNOSIS — D631 Anemia in chronic kidney disease: Secondary | ICD-10-CM | POA: Diagnosis not present

## 2017-10-03 DIAGNOSIS — E1122 Type 2 diabetes mellitus with diabetic chronic kidney disease: Secondary | ICD-10-CM | POA: Diagnosis not present

## 2017-10-03 DIAGNOSIS — D509 Iron deficiency anemia, unspecified: Secondary | ICD-10-CM | POA: Diagnosis not present

## 2017-10-03 DIAGNOSIS — R319 Hematuria, unspecified: Secondary | ICD-10-CM | POA: Diagnosis not present

## 2017-10-03 NOTE — Progress Notes (Signed)
Subjective: 84 y.o. returns the office today for painful, elongated, thickened toenails which he cannot trim himself. Denies any redness or drainage around the nails.  He also states he has a callus in the back of his left heel and right 2nd toe which becomes painful. Denies any drainage or swelling.  Denies any acute changes since last appointment and no new complaints today. Denies any systemic complaints such as fevers, chills, nausea, vomiting.   He is on Coumadin  PCP: Leanna Battles, MD  Objective: AAO 3, NAD DP pulse 2/4 on the right, 1/1 the left and PT pulses nonpalpable bilaterally this is unchanged compared to previous appointment.  Nails hypertrophic, dystrophic, elongated, brittle, discolored 10. There is tenderness overlying the nails 1-5 bilaterally. There is no surrounding erythema or drainage along the nail sites. Pre-ulcerative hyperkeratotic lesion to the posterior aspect the left heel without any underlying ulceration or signs of infection. On the dorsal aspect of the right second toe is a hyperkeratotic lesion of the DIPJ.  Upon debridement there is a small superficial granular wound and there is surrounding erythema to the DIPJ but there is no ascending cellulitis.  There is no fluctuation or crepitation.  There is no malodor.  No open lesions or pre-ulcerative lesions are identified. No other areas of tenderness bilateral lower extremities. No overlying edema, erythema, increased warmth. No pain with calf compression, swelling, warmth, erythema.  Assessment: Patient presents with symptomatic onychomycosis; superficial wound right second toe with localized erythema ; pre-ulcerative callus; currently on Coumadin as well as PAD  Plan: -Treatment options including alternatives, risks, complications were discussed -Nails sharply debrided 10 without complication/bleeding. -Pre-ulcerative callus was sharply debrided x1 without any complications or bleeding -He comes to the  small wound on the right second toe antibiotic ointment dressing changes daily.  Also prescribed Keflex.  Offloading at all times. Monitor for any clinical signs or symptoms of infection and directed to call the office immediately should any occur or go to the ER. -Discussed daily foot inspection. If there are any changes, to call the office immediately.  No skin breakdown currently to monitor closely -Follow-up in 2 weeks to check the wound of the right second toe or sooner if any problems are to arise. In the meantime, encouraged to call the office with any questions, concerns, changes symptoms.  Celesta Gentile, DPM

## 2017-10-05 DIAGNOSIS — E1129 Type 2 diabetes mellitus with other diabetic kidney complication: Secondary | ICD-10-CM | POA: Diagnosis not present

## 2017-10-05 DIAGNOSIS — N186 End stage renal disease: Secondary | ICD-10-CM | POA: Diagnosis not present

## 2017-10-05 DIAGNOSIS — R319 Hematuria, unspecified: Secondary | ICD-10-CM | POA: Diagnosis not present

## 2017-10-05 DIAGNOSIS — D509 Iron deficiency anemia, unspecified: Secondary | ICD-10-CM | POA: Diagnosis not present

## 2017-10-05 DIAGNOSIS — E1122 Type 2 diabetes mellitus with diabetic chronic kidney disease: Secondary | ICD-10-CM | POA: Diagnosis not present

## 2017-10-05 DIAGNOSIS — D631 Anemia in chronic kidney disease: Secondary | ICD-10-CM | POA: Diagnosis not present

## 2017-10-08 DIAGNOSIS — N186 End stage renal disease: Secondary | ICD-10-CM | POA: Diagnosis not present

## 2017-10-08 DIAGNOSIS — E1122 Type 2 diabetes mellitus with diabetic chronic kidney disease: Secondary | ICD-10-CM | POA: Diagnosis not present

## 2017-10-08 DIAGNOSIS — D631 Anemia in chronic kidney disease: Secondary | ICD-10-CM | POA: Diagnosis not present

## 2017-10-08 DIAGNOSIS — D509 Iron deficiency anemia, unspecified: Secondary | ICD-10-CM | POA: Diagnosis not present

## 2017-10-08 DIAGNOSIS — E1129 Type 2 diabetes mellitus with other diabetic kidney complication: Secondary | ICD-10-CM | POA: Diagnosis not present

## 2017-10-08 DIAGNOSIS — R319 Hematuria, unspecified: Secondary | ICD-10-CM | POA: Diagnosis not present

## 2017-10-10 DIAGNOSIS — E1129 Type 2 diabetes mellitus with other diabetic kidney complication: Secondary | ICD-10-CM | POA: Diagnosis not present

## 2017-10-10 DIAGNOSIS — R319 Hematuria, unspecified: Secondary | ICD-10-CM | POA: Diagnosis not present

## 2017-10-10 DIAGNOSIS — D509 Iron deficiency anemia, unspecified: Secondary | ICD-10-CM | POA: Diagnosis not present

## 2017-10-10 DIAGNOSIS — D631 Anemia in chronic kidney disease: Secondary | ICD-10-CM | POA: Diagnosis not present

## 2017-10-10 DIAGNOSIS — N186 End stage renal disease: Secondary | ICD-10-CM | POA: Diagnosis not present

## 2017-10-10 DIAGNOSIS — E1122 Type 2 diabetes mellitus with diabetic chronic kidney disease: Secondary | ICD-10-CM | POA: Diagnosis not present

## 2017-10-12 DIAGNOSIS — R319 Hematuria, unspecified: Secondary | ICD-10-CM | POA: Diagnosis not present

## 2017-10-12 DIAGNOSIS — D509 Iron deficiency anemia, unspecified: Secondary | ICD-10-CM | POA: Diagnosis not present

## 2017-10-12 DIAGNOSIS — E1129 Type 2 diabetes mellitus with other diabetic kidney complication: Secondary | ICD-10-CM | POA: Diagnosis not present

## 2017-10-12 DIAGNOSIS — N186 End stage renal disease: Secondary | ICD-10-CM | POA: Diagnosis not present

## 2017-10-12 DIAGNOSIS — E1122 Type 2 diabetes mellitus with diabetic chronic kidney disease: Secondary | ICD-10-CM | POA: Diagnosis not present

## 2017-10-12 DIAGNOSIS — D631 Anemia in chronic kidney disease: Secondary | ICD-10-CM | POA: Diagnosis not present

## 2017-10-15 DIAGNOSIS — D631 Anemia in chronic kidney disease: Secondary | ICD-10-CM | POA: Diagnosis not present

## 2017-10-15 DIAGNOSIS — E1129 Type 2 diabetes mellitus with other diabetic kidney complication: Secondary | ICD-10-CM | POA: Diagnosis not present

## 2017-10-15 DIAGNOSIS — N186 End stage renal disease: Secondary | ICD-10-CM | POA: Diagnosis not present

## 2017-10-15 DIAGNOSIS — E1122 Type 2 diabetes mellitus with diabetic chronic kidney disease: Secondary | ICD-10-CM | POA: Diagnosis not present

## 2017-10-15 DIAGNOSIS — R319 Hematuria, unspecified: Secondary | ICD-10-CM | POA: Diagnosis not present

## 2017-10-15 DIAGNOSIS — D509 Iron deficiency anemia, unspecified: Secondary | ICD-10-CM | POA: Diagnosis not present

## 2017-10-16 DIAGNOSIS — T82858A Stenosis of vascular prosthetic devices, implants and grafts, initial encounter: Secondary | ICD-10-CM | POA: Diagnosis not present

## 2017-10-16 DIAGNOSIS — I871 Compression of vein: Secondary | ICD-10-CM | POA: Diagnosis not present

## 2017-10-16 DIAGNOSIS — Z992 Dependence on renal dialysis: Secondary | ICD-10-CM | POA: Diagnosis not present

## 2017-10-16 DIAGNOSIS — N186 End stage renal disease: Secondary | ICD-10-CM | POA: Diagnosis not present

## 2017-10-17 DIAGNOSIS — E1129 Type 2 diabetes mellitus with other diabetic kidney complication: Secondary | ICD-10-CM | POA: Diagnosis not present

## 2017-10-17 DIAGNOSIS — N186 End stage renal disease: Secondary | ICD-10-CM | POA: Diagnosis not present

## 2017-10-17 DIAGNOSIS — E1122 Type 2 diabetes mellitus with diabetic chronic kidney disease: Secondary | ICD-10-CM | POA: Diagnosis not present

## 2017-10-17 DIAGNOSIS — D631 Anemia in chronic kidney disease: Secondary | ICD-10-CM | POA: Diagnosis not present

## 2017-10-17 DIAGNOSIS — D509 Iron deficiency anemia, unspecified: Secondary | ICD-10-CM | POA: Diagnosis not present

## 2017-10-17 DIAGNOSIS — R319 Hematuria, unspecified: Secondary | ICD-10-CM | POA: Diagnosis not present

## 2017-10-18 ENCOUNTER — Ambulatory Visit (INDEPENDENT_AMBULATORY_CARE_PROVIDER_SITE_OTHER): Payer: Medicare Other | Admitting: *Deleted

## 2017-10-18 DIAGNOSIS — I482 Chronic atrial fibrillation, unspecified: Secondary | ICD-10-CM

## 2017-10-18 DIAGNOSIS — Z5181 Encounter for therapeutic drug level monitoring: Secondary | ICD-10-CM

## 2017-10-18 LAB — POCT INR: INR: 2.8

## 2017-10-18 NOTE — Patient Instructions (Signed)
Description   Continue on same dosage 1 tablet daily except 1.5  tablets on Mondays, Wednesdays, and Fridays.  Recheck INR in 6 weeks. [facility] [phone]

## 2017-10-19 DIAGNOSIS — E1129 Type 2 diabetes mellitus with other diabetic kidney complication: Secondary | ICD-10-CM | POA: Diagnosis not present

## 2017-10-19 DIAGNOSIS — E1122 Type 2 diabetes mellitus with diabetic chronic kidney disease: Secondary | ICD-10-CM | POA: Diagnosis not present

## 2017-10-19 DIAGNOSIS — N186 End stage renal disease: Secondary | ICD-10-CM | POA: Diagnosis not present

## 2017-10-19 DIAGNOSIS — D631 Anemia in chronic kidney disease: Secondary | ICD-10-CM | POA: Diagnosis not present

## 2017-10-19 DIAGNOSIS — R319 Hematuria, unspecified: Secondary | ICD-10-CM | POA: Diagnosis not present

## 2017-10-19 DIAGNOSIS — D509 Iron deficiency anemia, unspecified: Secondary | ICD-10-CM | POA: Diagnosis not present

## 2017-10-23 ENCOUNTER — Ambulatory Visit: Payer: Medicare Other | Admitting: Podiatry

## 2017-10-24 DIAGNOSIS — N2581 Secondary hyperparathyroidism of renal origin: Secondary | ICD-10-CM | POA: Diagnosis not present

## 2017-10-24 DIAGNOSIS — E1129 Type 2 diabetes mellitus with other diabetic kidney complication: Secondary | ICD-10-CM | POA: Diagnosis not present

## 2017-10-24 DIAGNOSIS — D509 Iron deficiency anemia, unspecified: Secondary | ICD-10-CM | POA: Diagnosis not present

## 2017-10-24 DIAGNOSIS — N186 End stage renal disease: Secondary | ICD-10-CM | POA: Diagnosis not present

## 2017-10-24 DIAGNOSIS — Z992 Dependence on renal dialysis: Secondary | ICD-10-CM | POA: Diagnosis not present

## 2017-10-24 DIAGNOSIS — N390 Urinary tract infection, site not specified: Secondary | ICD-10-CM | POA: Diagnosis not present

## 2017-10-24 DIAGNOSIS — E1122 Type 2 diabetes mellitus with diabetic chronic kidney disease: Secondary | ICD-10-CM | POA: Diagnosis not present

## 2017-10-26 DIAGNOSIS — N390 Urinary tract infection, site not specified: Secondary | ICD-10-CM | POA: Diagnosis not present

## 2017-10-26 DIAGNOSIS — E1129 Type 2 diabetes mellitus with other diabetic kidney complication: Secondary | ICD-10-CM | POA: Diagnosis not present

## 2017-10-26 DIAGNOSIS — D509 Iron deficiency anemia, unspecified: Secondary | ICD-10-CM | POA: Diagnosis not present

## 2017-10-26 DIAGNOSIS — N2581 Secondary hyperparathyroidism of renal origin: Secondary | ICD-10-CM | POA: Diagnosis not present

## 2017-10-26 DIAGNOSIS — N186 End stage renal disease: Secondary | ICD-10-CM | POA: Diagnosis not present

## 2017-11-02 DIAGNOSIS — N2581 Secondary hyperparathyroidism of renal origin: Secondary | ICD-10-CM | POA: Diagnosis not present

## 2017-11-02 DIAGNOSIS — D509 Iron deficiency anemia, unspecified: Secondary | ICD-10-CM | POA: Diagnosis not present

## 2017-11-02 DIAGNOSIS — E1129 Type 2 diabetes mellitus with other diabetic kidney complication: Secondary | ICD-10-CM | POA: Diagnosis not present

## 2017-11-02 DIAGNOSIS — N390 Urinary tract infection, site not specified: Secondary | ICD-10-CM | POA: Diagnosis not present

## 2017-11-02 DIAGNOSIS — N186 End stage renal disease: Secondary | ICD-10-CM | POA: Diagnosis not present

## 2017-11-05 DIAGNOSIS — D509 Iron deficiency anemia, unspecified: Secondary | ICD-10-CM | POA: Diagnosis not present

## 2017-11-05 DIAGNOSIS — N390 Urinary tract infection, site not specified: Secondary | ICD-10-CM | POA: Diagnosis not present

## 2017-11-05 DIAGNOSIS — N2581 Secondary hyperparathyroidism of renal origin: Secondary | ICD-10-CM | POA: Diagnosis not present

## 2017-11-05 DIAGNOSIS — N186 End stage renal disease: Secondary | ICD-10-CM | POA: Diagnosis not present

## 2017-11-05 DIAGNOSIS — E1129 Type 2 diabetes mellitus with other diabetic kidney complication: Secondary | ICD-10-CM | POA: Diagnosis not present

## 2017-11-07 DIAGNOSIS — D509 Iron deficiency anemia, unspecified: Secondary | ICD-10-CM | POA: Diagnosis not present

## 2017-11-07 DIAGNOSIS — N2581 Secondary hyperparathyroidism of renal origin: Secondary | ICD-10-CM | POA: Diagnosis not present

## 2017-11-07 DIAGNOSIS — N186 End stage renal disease: Secondary | ICD-10-CM | POA: Diagnosis not present

## 2017-11-07 DIAGNOSIS — E1129 Type 2 diabetes mellitus with other diabetic kidney complication: Secondary | ICD-10-CM | POA: Diagnosis not present

## 2017-11-07 DIAGNOSIS — N390 Urinary tract infection, site not specified: Secondary | ICD-10-CM | POA: Diagnosis not present

## 2017-11-08 DIAGNOSIS — Z6825 Body mass index (BMI) 25.0-25.9, adult: Secondary | ICD-10-CM | POA: Diagnosis not present

## 2017-11-08 DIAGNOSIS — E1165 Type 2 diabetes mellitus with hyperglycemia: Secondary | ICD-10-CM | POA: Diagnosis not present

## 2017-11-08 DIAGNOSIS — N186 End stage renal disease: Secondary | ICD-10-CM | POA: Diagnosis not present

## 2017-11-08 DIAGNOSIS — S40922A Unspecified superficial injury of left upper arm, initial encounter: Secondary | ICD-10-CM | POA: Diagnosis not present

## 2017-11-08 DIAGNOSIS — I10 Essential (primary) hypertension: Secondary | ICD-10-CM | POA: Diagnosis not present

## 2017-11-09 DIAGNOSIS — N186 End stage renal disease: Secondary | ICD-10-CM | POA: Diagnosis not present

## 2017-11-09 DIAGNOSIS — E1129 Type 2 diabetes mellitus with other diabetic kidney complication: Secondary | ICD-10-CM | POA: Diagnosis not present

## 2017-11-09 DIAGNOSIS — N390 Urinary tract infection, site not specified: Secondary | ICD-10-CM | POA: Diagnosis not present

## 2017-11-09 DIAGNOSIS — D509 Iron deficiency anemia, unspecified: Secondary | ICD-10-CM | POA: Diagnosis not present

## 2017-11-09 DIAGNOSIS — N2581 Secondary hyperparathyroidism of renal origin: Secondary | ICD-10-CM | POA: Diagnosis not present

## 2017-11-14 DIAGNOSIS — N390 Urinary tract infection, site not specified: Secondary | ICD-10-CM | POA: Diagnosis not present

## 2017-11-14 DIAGNOSIS — N2581 Secondary hyperparathyroidism of renal origin: Secondary | ICD-10-CM | POA: Diagnosis not present

## 2017-11-14 DIAGNOSIS — D509 Iron deficiency anemia, unspecified: Secondary | ICD-10-CM | POA: Diagnosis not present

## 2017-11-14 DIAGNOSIS — E1129 Type 2 diabetes mellitus with other diabetic kidney complication: Secondary | ICD-10-CM | POA: Diagnosis not present

## 2017-11-14 DIAGNOSIS — N186 End stage renal disease: Secondary | ICD-10-CM | POA: Diagnosis not present

## 2017-11-16 DIAGNOSIS — E1129 Type 2 diabetes mellitus with other diabetic kidney complication: Secondary | ICD-10-CM | POA: Diagnosis not present

## 2017-11-16 DIAGNOSIS — N390 Urinary tract infection, site not specified: Secondary | ICD-10-CM | POA: Diagnosis not present

## 2017-11-16 DIAGNOSIS — N2581 Secondary hyperparathyroidism of renal origin: Secondary | ICD-10-CM | POA: Diagnosis not present

## 2017-11-16 DIAGNOSIS — D509 Iron deficiency anemia, unspecified: Secondary | ICD-10-CM | POA: Diagnosis not present

## 2017-11-16 DIAGNOSIS — N186 End stage renal disease: Secondary | ICD-10-CM | POA: Diagnosis not present

## 2017-11-19 DIAGNOSIS — E1129 Type 2 diabetes mellitus with other diabetic kidney complication: Secondary | ICD-10-CM | POA: Diagnosis not present

## 2017-11-19 DIAGNOSIS — D509 Iron deficiency anemia, unspecified: Secondary | ICD-10-CM | POA: Diagnosis not present

## 2017-11-19 DIAGNOSIS — N186 End stage renal disease: Secondary | ICD-10-CM | POA: Diagnosis not present

## 2017-11-19 DIAGNOSIS — N2581 Secondary hyperparathyroidism of renal origin: Secondary | ICD-10-CM | POA: Diagnosis not present

## 2017-11-19 DIAGNOSIS — N390 Urinary tract infection, site not specified: Secondary | ICD-10-CM | POA: Diagnosis not present

## 2017-11-21 DIAGNOSIS — N186 End stage renal disease: Secondary | ICD-10-CM | POA: Diagnosis not present

## 2017-11-21 DIAGNOSIS — N2581 Secondary hyperparathyroidism of renal origin: Secondary | ICD-10-CM | POA: Diagnosis not present

## 2017-11-21 DIAGNOSIS — D509 Iron deficiency anemia, unspecified: Secondary | ICD-10-CM | POA: Diagnosis not present

## 2017-11-21 DIAGNOSIS — E1129 Type 2 diabetes mellitus with other diabetic kidney complication: Secondary | ICD-10-CM | POA: Diagnosis not present

## 2017-11-21 DIAGNOSIS — N390 Urinary tract infection, site not specified: Secondary | ICD-10-CM | POA: Diagnosis not present

## 2017-11-23 DIAGNOSIS — N390 Urinary tract infection, site not specified: Secondary | ICD-10-CM | POA: Diagnosis not present

## 2017-11-23 DIAGNOSIS — N186 End stage renal disease: Secondary | ICD-10-CM | POA: Diagnosis not present

## 2017-11-23 DIAGNOSIS — E1129 Type 2 diabetes mellitus with other diabetic kidney complication: Secondary | ICD-10-CM | POA: Diagnosis not present

## 2017-11-23 DIAGNOSIS — D509 Iron deficiency anemia, unspecified: Secondary | ICD-10-CM | POA: Diagnosis not present

## 2017-11-23 DIAGNOSIS — N2581 Secondary hyperparathyroidism of renal origin: Secondary | ICD-10-CM | POA: Diagnosis not present

## 2017-11-24 DIAGNOSIS — Z992 Dependence on renal dialysis: Secondary | ICD-10-CM | POA: Diagnosis not present

## 2017-11-24 DIAGNOSIS — N186 End stage renal disease: Secondary | ICD-10-CM | POA: Diagnosis not present

## 2017-11-24 DIAGNOSIS — E1122 Type 2 diabetes mellitus with diabetic chronic kidney disease: Secondary | ICD-10-CM | POA: Diagnosis not present

## 2017-11-26 DIAGNOSIS — N186 End stage renal disease: Secondary | ICD-10-CM | POA: Diagnosis not present

## 2017-11-26 DIAGNOSIS — D509 Iron deficiency anemia, unspecified: Secondary | ICD-10-CM | POA: Diagnosis not present

## 2017-11-26 DIAGNOSIS — E1129 Type 2 diabetes mellitus with other diabetic kidney complication: Secondary | ICD-10-CM | POA: Diagnosis not present

## 2017-11-26 DIAGNOSIS — N2581 Secondary hyperparathyroidism of renal origin: Secondary | ICD-10-CM | POA: Diagnosis not present

## 2017-11-26 DIAGNOSIS — R319 Hematuria, unspecified: Secondary | ICD-10-CM | POA: Diagnosis not present

## 2017-11-28 DIAGNOSIS — R319 Hematuria, unspecified: Secondary | ICD-10-CM | POA: Diagnosis not present

## 2017-11-28 DIAGNOSIS — E1129 Type 2 diabetes mellitus with other diabetic kidney complication: Secondary | ICD-10-CM | POA: Diagnosis not present

## 2017-11-28 DIAGNOSIS — N2581 Secondary hyperparathyroidism of renal origin: Secondary | ICD-10-CM | POA: Diagnosis not present

## 2017-11-28 DIAGNOSIS — N186 End stage renal disease: Secondary | ICD-10-CM | POA: Diagnosis not present

## 2017-11-28 DIAGNOSIS — D509 Iron deficiency anemia, unspecified: Secondary | ICD-10-CM | POA: Diagnosis not present

## 2017-11-29 ENCOUNTER — Ambulatory Visit (INDEPENDENT_AMBULATORY_CARE_PROVIDER_SITE_OTHER): Payer: Medicare Other

## 2017-11-29 DIAGNOSIS — Z5181 Encounter for therapeutic drug level monitoring: Secondary | ICD-10-CM | POA: Diagnosis not present

## 2017-11-29 DIAGNOSIS — I482 Chronic atrial fibrillation, unspecified: Secondary | ICD-10-CM

## 2017-11-29 LAB — POCT INR: INR: 2.2 (ref 2.0–3.0)

## 2017-11-29 NOTE — Patient Instructions (Signed)
Description   Continue on same dosage 1 tablet daily except 1.5 tablets on Mondays, Wednesdays, and Fridays.  Recheck INR in 6 weeks. [facility] [phone]

## 2017-11-30 DIAGNOSIS — D509 Iron deficiency anemia, unspecified: Secondary | ICD-10-CM | POA: Diagnosis not present

## 2017-11-30 DIAGNOSIS — N2581 Secondary hyperparathyroidism of renal origin: Secondary | ICD-10-CM | POA: Diagnosis not present

## 2017-11-30 DIAGNOSIS — R319 Hematuria, unspecified: Secondary | ICD-10-CM | POA: Diagnosis not present

## 2017-11-30 DIAGNOSIS — N186 End stage renal disease: Secondary | ICD-10-CM | POA: Diagnosis not present

## 2017-11-30 DIAGNOSIS — E1129 Type 2 diabetes mellitus with other diabetic kidney complication: Secondary | ICD-10-CM | POA: Diagnosis not present

## 2017-12-03 DIAGNOSIS — E1129 Type 2 diabetes mellitus with other diabetic kidney complication: Secondary | ICD-10-CM | POA: Diagnosis not present

## 2017-12-03 DIAGNOSIS — N2581 Secondary hyperparathyroidism of renal origin: Secondary | ICD-10-CM | POA: Diagnosis not present

## 2017-12-03 DIAGNOSIS — R319 Hematuria, unspecified: Secondary | ICD-10-CM | POA: Diagnosis not present

## 2017-12-03 DIAGNOSIS — D509 Iron deficiency anemia, unspecified: Secondary | ICD-10-CM | POA: Diagnosis not present

## 2017-12-03 DIAGNOSIS — N186 End stage renal disease: Secondary | ICD-10-CM | POA: Diagnosis not present

## 2017-12-05 DIAGNOSIS — N2581 Secondary hyperparathyroidism of renal origin: Secondary | ICD-10-CM | POA: Diagnosis not present

## 2017-12-05 DIAGNOSIS — N186 End stage renal disease: Secondary | ICD-10-CM | POA: Diagnosis not present

## 2017-12-05 DIAGNOSIS — R319 Hematuria, unspecified: Secondary | ICD-10-CM | POA: Diagnosis not present

## 2017-12-05 DIAGNOSIS — D509 Iron deficiency anemia, unspecified: Secondary | ICD-10-CM | POA: Diagnosis not present

## 2017-12-05 DIAGNOSIS — E1129 Type 2 diabetes mellitus with other diabetic kidney complication: Secondary | ICD-10-CM | POA: Diagnosis not present

## 2017-12-07 DIAGNOSIS — N186 End stage renal disease: Secondary | ICD-10-CM | POA: Diagnosis not present

## 2017-12-07 DIAGNOSIS — D509 Iron deficiency anemia, unspecified: Secondary | ICD-10-CM | POA: Diagnosis not present

## 2017-12-07 DIAGNOSIS — N2581 Secondary hyperparathyroidism of renal origin: Secondary | ICD-10-CM | POA: Diagnosis not present

## 2017-12-07 DIAGNOSIS — E1129 Type 2 diabetes mellitus with other diabetic kidney complication: Secondary | ICD-10-CM | POA: Diagnosis not present

## 2017-12-07 DIAGNOSIS — R319 Hematuria, unspecified: Secondary | ICD-10-CM | POA: Diagnosis not present

## 2017-12-11 DIAGNOSIS — H35071 Retinal telangiectasis, right eye: Secondary | ICD-10-CM | POA: Diagnosis not present

## 2017-12-11 DIAGNOSIS — H35043 Retinal micro-aneurysms, unspecified, bilateral: Secondary | ICD-10-CM | POA: Diagnosis not present

## 2017-12-11 DIAGNOSIS — E113393 Type 2 diabetes mellitus with moderate nonproliferative diabetic retinopathy without macular edema, bilateral: Secondary | ICD-10-CM | POA: Diagnosis not present

## 2017-12-11 DIAGNOSIS — H43813 Vitreous degeneration, bilateral: Secondary | ICD-10-CM | POA: Diagnosis not present

## 2017-12-12 DIAGNOSIS — D509 Iron deficiency anemia, unspecified: Secondary | ICD-10-CM | POA: Diagnosis not present

## 2017-12-12 DIAGNOSIS — N2581 Secondary hyperparathyroidism of renal origin: Secondary | ICD-10-CM | POA: Diagnosis not present

## 2017-12-12 DIAGNOSIS — R319 Hematuria, unspecified: Secondary | ICD-10-CM | POA: Diagnosis not present

## 2017-12-12 DIAGNOSIS — N186 End stage renal disease: Secondary | ICD-10-CM | POA: Diagnosis not present

## 2017-12-12 DIAGNOSIS — E1129 Type 2 diabetes mellitus with other diabetic kidney complication: Secondary | ICD-10-CM | POA: Diagnosis not present

## 2017-12-14 DIAGNOSIS — E1129 Type 2 diabetes mellitus with other diabetic kidney complication: Secondary | ICD-10-CM | POA: Diagnosis not present

## 2017-12-14 DIAGNOSIS — N186 End stage renal disease: Secondary | ICD-10-CM | POA: Diagnosis not present

## 2017-12-14 DIAGNOSIS — N2581 Secondary hyperparathyroidism of renal origin: Secondary | ICD-10-CM | POA: Diagnosis not present

## 2017-12-14 DIAGNOSIS — R319 Hematuria, unspecified: Secondary | ICD-10-CM | POA: Diagnosis not present

## 2017-12-14 DIAGNOSIS — D509 Iron deficiency anemia, unspecified: Secondary | ICD-10-CM | POA: Diagnosis not present

## 2017-12-14 IMAGING — DX DG CHEST 1V PORT
1 series · 1 of 1 positions shown · non-contrast
Comparison: 12/30/2016

CLINICAL DATA: Chest pain tonight

EXAM:
PORTABLE CHEST 1 VIEW

[chest ap]
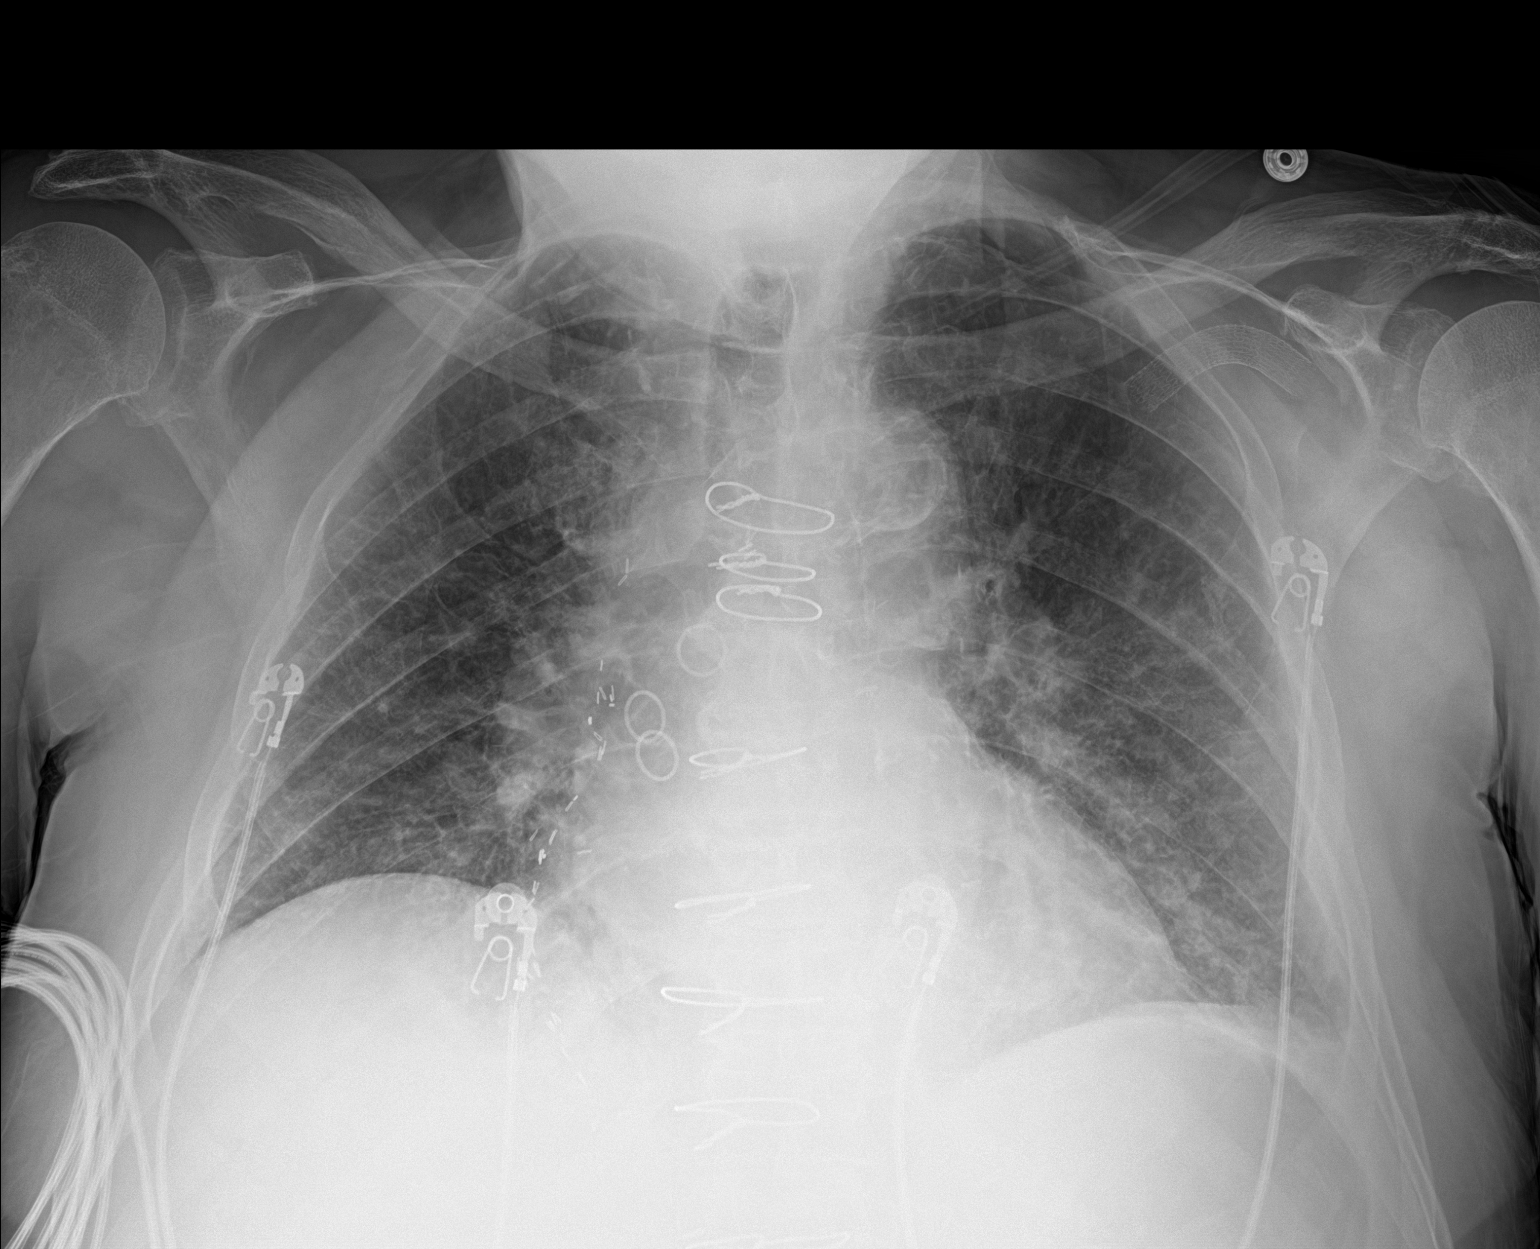

[1 of 1 positions shown; findings below may reference images not displayed]

FINDINGS: Postoperative changes in the mediastinum. Shallow inspiration with
linear atelectasis in the lung bases, mostly on the left. Heart size
and pulmonary vascularity are normal. Developing interstitial
changes in the lower lungs may indicate developing edema or
interstitial pneumonitis. Mild blunting of costophrenic angles
suggesting small effusions. No pneumothorax. Calcified and tortuous
aorta. Vascular stent projected over the left axilla. Degenerative
changes in the shoulders.
IMPRESSION: Shallow inspiration with atelectasis in the lung bases. Developing
interstitial pattern to the lungs may represent interstitial edema
or pneumonitis. Blunting of the costophrenic angles may indicate
tiny pleural effusions.

## 2017-12-17 DIAGNOSIS — N2581 Secondary hyperparathyroidism of renal origin: Secondary | ICD-10-CM | POA: Diagnosis not present

## 2017-12-17 DIAGNOSIS — N186 End stage renal disease: Secondary | ICD-10-CM | POA: Diagnosis not present

## 2017-12-17 DIAGNOSIS — R319 Hematuria, unspecified: Secondary | ICD-10-CM | POA: Diagnosis not present

## 2017-12-17 DIAGNOSIS — E1129 Type 2 diabetes mellitus with other diabetic kidney complication: Secondary | ICD-10-CM | POA: Diagnosis not present

## 2017-12-17 DIAGNOSIS — D509 Iron deficiency anemia, unspecified: Secondary | ICD-10-CM | POA: Diagnosis not present

## 2017-12-18 DIAGNOSIS — L570 Actinic keratosis: Secondary | ICD-10-CM | POA: Diagnosis not present

## 2017-12-18 DIAGNOSIS — L2084 Intrinsic (allergic) eczema: Secondary | ICD-10-CM | POA: Diagnosis not present

## 2017-12-18 DIAGNOSIS — D1801 Hemangioma of skin and subcutaneous tissue: Secondary | ICD-10-CM | POA: Diagnosis not present

## 2017-12-19 DIAGNOSIS — E1129 Type 2 diabetes mellitus with other diabetic kidney complication: Secondary | ICD-10-CM | POA: Diagnosis not present

## 2017-12-19 DIAGNOSIS — N186 End stage renal disease: Secondary | ICD-10-CM | POA: Diagnosis not present

## 2017-12-19 DIAGNOSIS — N2581 Secondary hyperparathyroidism of renal origin: Secondary | ICD-10-CM | POA: Diagnosis not present

## 2017-12-19 DIAGNOSIS — R319 Hematuria, unspecified: Secondary | ICD-10-CM | POA: Diagnosis not present

## 2017-12-19 DIAGNOSIS — D509 Iron deficiency anemia, unspecified: Secondary | ICD-10-CM | POA: Diagnosis not present

## 2017-12-21 DIAGNOSIS — N186 End stage renal disease: Secondary | ICD-10-CM | POA: Diagnosis not present

## 2017-12-21 DIAGNOSIS — N2581 Secondary hyperparathyroidism of renal origin: Secondary | ICD-10-CM | POA: Diagnosis not present

## 2017-12-21 DIAGNOSIS — E1129 Type 2 diabetes mellitus with other diabetic kidney complication: Secondary | ICD-10-CM | POA: Diagnosis not present

## 2017-12-21 DIAGNOSIS — R319 Hematuria, unspecified: Secondary | ICD-10-CM | POA: Diagnosis not present

## 2017-12-21 DIAGNOSIS — D509 Iron deficiency anemia, unspecified: Secondary | ICD-10-CM | POA: Diagnosis not present

## 2017-12-24 DIAGNOSIS — E1129 Type 2 diabetes mellitus with other diabetic kidney complication: Secondary | ICD-10-CM | POA: Diagnosis not present

## 2017-12-24 DIAGNOSIS — N186 End stage renal disease: Secondary | ICD-10-CM | POA: Diagnosis not present

## 2017-12-24 DIAGNOSIS — N2581 Secondary hyperparathyroidism of renal origin: Secondary | ICD-10-CM | POA: Diagnosis not present

## 2017-12-26 ENCOUNTER — Encounter (HOSPITAL_COMMUNITY): Payer: Self-pay | Admitting: Emergency Medicine

## 2017-12-26 ENCOUNTER — Other Ambulatory Visit: Payer: Self-pay

## 2017-12-26 ENCOUNTER — Inpatient Hospital Stay (HOSPITAL_COMMUNITY): Payer: Medicare Other

## 2017-12-26 ENCOUNTER — Emergency Department (HOSPITAL_COMMUNITY): Payer: Medicare Other

## 2017-12-26 ENCOUNTER — Inpatient Hospital Stay (HOSPITAL_COMMUNITY)
Admission: EM | Admit: 2017-12-26 | Discharge: 2018-01-02 | DRG: 280 | Disposition: A | Discharge status: Hospice | Payer: Medicare Other | Attending: Internal Medicine | Admitting: Internal Medicine

## 2017-12-26 DIAGNOSIS — M109 Gout, unspecified: Secondary | ICD-10-CM | POA: Diagnosis present

## 2017-12-26 DIAGNOSIS — I5021 Acute systolic (congestive) heart failure: Secondary | ICD-10-CM | POA: Diagnosis not present

## 2017-12-26 DIAGNOSIS — N390 Urinary tract infection, site not specified: Secondary | ICD-10-CM | POA: Diagnosis present

## 2017-12-26 DIAGNOSIS — R791 Abnormal coagulation profile: Secondary | ICD-10-CM | POA: Diagnosis present

## 2017-12-26 DIAGNOSIS — Z91041 Radiographic dye allergy status: Secondary | ICD-10-CM

## 2017-12-26 DIAGNOSIS — Z79890 Hormone replacement therapy (postmenopausal): Secondary | ICD-10-CM

## 2017-12-26 DIAGNOSIS — Z7901 Long term (current) use of anticoagulants: Secondary | ICD-10-CM

## 2017-12-26 DIAGNOSIS — H8109 Meniere's disease, unspecified ear: Secondary | ICD-10-CM | POA: Diagnosis present

## 2017-12-26 DIAGNOSIS — F0150 Vascular dementia without behavioral disturbance: Secondary | ICD-10-CM | POA: Diagnosis not present

## 2017-12-26 DIAGNOSIS — Z7189 Other specified counseling: Secondary | ICD-10-CM | POA: Diagnosis not present

## 2017-12-26 DIAGNOSIS — R1310 Dysphagia, unspecified: Secondary | ICD-10-CM | POA: Diagnosis not present

## 2017-12-26 DIAGNOSIS — I340 Nonrheumatic mitral (valve) insufficiency: Secondary | ICD-10-CM

## 2017-12-26 DIAGNOSIS — I5043 Acute on chronic combined systolic (congestive) and diastolic (congestive) heart failure: Secondary | ICD-10-CM | POA: Diagnosis present

## 2017-12-26 DIAGNOSIS — F0390 Unspecified dementia without behavioral disturbance: Secondary | ICD-10-CM | POA: Diagnosis present

## 2017-12-26 DIAGNOSIS — I480 Paroxysmal atrial fibrillation: Secondary | ICD-10-CM | POA: Diagnosis not present

## 2017-12-26 DIAGNOSIS — E1122 Type 2 diabetes mellitus with diabetic chronic kidney disease: Secondary | ICD-10-CM | POA: Diagnosis present

## 2017-12-26 DIAGNOSIS — D631 Anemia in chronic kidney disease: Secondary | ICD-10-CM | POA: Diagnosis present

## 2017-12-26 DIAGNOSIS — R279 Unspecified lack of coordination: Secondary | ICD-10-CM | POA: Diagnosis not present

## 2017-12-26 DIAGNOSIS — N2581 Secondary hyperparathyroidism of renal origin: Secondary | ICD-10-CM | POA: Diagnosis present

## 2017-12-26 DIAGNOSIS — I132 Hypertensive heart and chronic kidney disease with heart failure and with stage 5 chronic kidney disease, or end stage renal disease: Secondary | ICD-10-CM | POA: Diagnosis present

## 2017-12-26 DIAGNOSIS — L409 Psoriasis, unspecified: Secondary | ICD-10-CM | POA: Diagnosis present

## 2017-12-26 DIAGNOSIS — K219 Gastro-esophageal reflux disease without esophagitis: Secondary | ICD-10-CM | POA: Diagnosis present

## 2017-12-26 DIAGNOSIS — I482 Chronic atrial fibrillation: Secondary | ICD-10-CM

## 2017-12-26 DIAGNOSIS — R7989 Other specified abnormal findings of blood chemistry: Secondary | ICD-10-CM | POA: Diagnosis present

## 2017-12-26 DIAGNOSIS — Z743 Need for continuous supervision: Secondary | ICD-10-CM | POA: Diagnosis not present

## 2017-12-26 DIAGNOSIS — I739 Peripheral vascular disease, unspecified: Secondary | ICD-10-CM | POA: Diagnosis not present

## 2017-12-26 DIAGNOSIS — Z9981 Dependence on supplemental oxygen: Secondary | ICD-10-CM | POA: Diagnosis not present

## 2017-12-26 DIAGNOSIS — I25118 Atherosclerotic heart disease of native coronary artery with other forms of angina pectoris: Secondary | ICD-10-CM | POA: Diagnosis not present

## 2017-12-26 DIAGNOSIS — Z992 Dependence on renal dialysis: Secondary | ICD-10-CM | POA: Diagnosis not present

## 2017-12-26 DIAGNOSIS — G309 Alzheimer's disease, unspecified: Secondary | ICD-10-CM | POA: Diagnosis not present

## 2017-12-26 DIAGNOSIS — E785 Hyperlipidemia, unspecified: Secondary | ICD-10-CM | POA: Diagnosis present

## 2017-12-26 DIAGNOSIS — R748 Abnormal levels of other serum enzymes: Secondary | ICD-10-CM | POA: Diagnosis not present

## 2017-12-26 DIAGNOSIS — Z66 Do not resuscitate: Secondary | ICD-10-CM | POA: Diagnosis present

## 2017-12-26 DIAGNOSIS — Z515 Encounter for palliative care: Secondary | ICD-10-CM

## 2017-12-26 DIAGNOSIS — E1151 Type 2 diabetes mellitus with diabetic peripheral angiopathy without gangrene: Secondary | ICD-10-CM | POA: Diagnosis present

## 2017-12-26 DIAGNOSIS — J9611 Chronic respiratory failure with hypoxia: Secondary | ICD-10-CM | POA: Diagnosis present

## 2017-12-26 DIAGNOSIS — I214 Non-ST elevation (NSTEMI) myocardial infarction: Secondary | ICD-10-CM | POA: Diagnosis not present

## 2017-12-26 DIAGNOSIS — Z8349 Family history of other endocrine, nutritional and metabolic diseases: Secondary | ICD-10-CM

## 2017-12-26 DIAGNOSIS — R0602 Shortness of breath: Secondary | ICD-10-CM | POA: Diagnosis not present

## 2017-12-26 DIAGNOSIS — Z91040 Latex allergy status: Secondary | ICD-10-CM

## 2017-12-26 DIAGNOSIS — R079 Chest pain, unspecified: Secondary | ICD-10-CM | POA: Diagnosis not present

## 2017-12-26 DIAGNOSIS — N186 End stage renal disease: Secondary | ICD-10-CM | POA: Diagnosis present

## 2017-12-26 DIAGNOSIS — R778 Other specified abnormalities of plasma proteins: Secondary | ICD-10-CM | POA: Diagnosis present

## 2017-12-26 DIAGNOSIS — Z951 Presence of aortocoronary bypass graft: Secondary | ICD-10-CM | POA: Diagnosis not present

## 2017-12-26 DIAGNOSIS — I2510 Atherosclerotic heart disease of native coronary artery without angina pectoris: Secondary | ICD-10-CM | POA: Diagnosis present

## 2017-12-26 DIAGNOSIS — I5033 Acute on chronic diastolic (congestive) heart failure: Secondary | ICD-10-CM | POA: Diagnosis not present

## 2017-12-26 DIAGNOSIS — I499 Cardiac arrhythmia, unspecified: Secondary | ICD-10-CM | POA: Diagnosis not present

## 2017-12-26 DIAGNOSIS — I447 Left bundle-branch block, unspecified: Secondary | ICD-10-CM | POA: Diagnosis not present

## 2017-12-26 DIAGNOSIS — Z888 Allergy status to other drugs, medicaments and biological substances status: Secondary | ICD-10-CM

## 2017-12-26 DIAGNOSIS — E1159 Type 2 diabetes mellitus with other circulatory complications: Secondary | ICD-10-CM | POA: Diagnosis not present

## 2017-12-26 DIAGNOSIS — R9431 Abnormal electrocardiogram [ECG] [EKG]: Secondary | ICD-10-CM | POA: Diagnosis not present

## 2017-12-26 DIAGNOSIS — I10 Essential (primary) hypertension: Secondary | ICD-10-CM | POA: Diagnosis not present

## 2017-12-26 DIAGNOSIS — M1990 Unspecified osteoarthritis, unspecified site: Secondary | ICD-10-CM | POA: Diagnosis present

## 2017-12-26 DIAGNOSIS — I4891 Unspecified atrial fibrillation: Secondary | ICD-10-CM | POA: Diagnosis present

## 2017-12-26 DIAGNOSIS — J810 Acute pulmonary edema: Secondary | ICD-10-CM

## 2017-12-26 DIAGNOSIS — I350 Nonrheumatic aortic (valve) stenosis: Secondary | ICD-10-CM | POA: Diagnosis present

## 2017-12-26 DIAGNOSIS — I120 Hypertensive chronic kidney disease with stage 5 chronic kidney disease or end stage renal disease: Secondary | ICD-10-CM | POA: Diagnosis not present

## 2017-12-26 DIAGNOSIS — Z833 Family history of diabetes mellitus: Secondary | ICD-10-CM

## 2017-12-26 DIAGNOSIS — I272 Pulmonary hypertension, unspecified: Secondary | ICD-10-CM | POA: Diagnosis not present

## 2017-12-26 DIAGNOSIS — I359 Nonrheumatic aortic valve disorder, unspecified: Secondary | ICD-10-CM | POA: Diagnosis not present

## 2017-12-26 DIAGNOSIS — R531 Weakness: Secondary | ICD-10-CM | POA: Diagnosis not present

## 2017-12-26 DIAGNOSIS — Z794 Long term (current) use of insulin: Secondary | ICD-10-CM

## 2017-12-26 DIAGNOSIS — Z91048 Other nonmedicinal substance allergy status: Secondary | ICD-10-CM

## 2017-12-26 DIAGNOSIS — E039 Hypothyroidism, unspecified: Secondary | ICD-10-CM | POA: Diagnosis present

## 2017-12-26 DIAGNOSIS — I509 Heart failure, unspecified: Secondary | ICD-10-CM | POA: Diagnosis not present

## 2017-12-26 DIAGNOSIS — I959 Hypotension, unspecified: Secondary | ICD-10-CM | POA: Diagnosis not present

## 2017-12-26 DIAGNOSIS — Z8249 Family history of ischemic heart disease and other diseases of the circulatory system: Secondary | ICD-10-CM

## 2017-12-26 LAB — GLUCOSE, CAPILLARY
GLUCOSE-CAPILLARY: 90 mg/dL (ref 70–99)
GLUCOSE-CAPILLARY: 86 mg/dL (ref 70–99)
GLUCOSE-CAPILLARY: 145 mg/dL — AB (ref 70–99)

## 2017-12-26 LAB — BASIC METABOLIC PANEL
Anion gap: 12 (ref 5–15)
BUN: 21 mg/dL (ref 8–23)
CALCIUM: 9.5 mg/dL (ref 8.9–10.3)
CO2: 33 mmol/L — ABNORMAL HIGH (ref 22–32)
Chloride: 94 mmol/L — ABNORMAL LOW (ref 98–111)
Creatinine, Ser: 6.45 mg/dL — ABNORMAL HIGH (ref 0.61–1.24)
GFR calc Af Amer: 8 mL/min — ABNORMAL LOW (ref >60)
GFR, EST NON AFRICAN AMERICAN: 7 mL/min — AB (ref >60)
Glucose, Bld: 131 mg/dL — ABNORMAL HIGH (ref 70–99)
POTASSIUM: 4.2 mmol/L (ref 3.5–5.1)
SODIUM: 139 mmol/L (ref 135–145)

## 2017-12-26 LAB — I-STAT TROPONIN, ED: TROPONIN I, POC: 0.3 ng/mL — AB (ref 0.00–0.08)

## 2017-12-26 LAB — RENAL FUNCTION PANEL
Albumin: 3.5 g/dL (ref 3.5–5.0)
Anion gap: 13 (ref 5–15)
BUN: 25 mg/dL — AB (ref 8–23)
CHLORIDE: 96 mmol/L — AB (ref 98–111)
CO2: 28 mmol/L (ref 22–32)
Calcium: 9.3 mg/dL (ref 8.9–10.3)
Creatinine, Ser: 7.04 mg/dL — ABNORMAL HIGH (ref 0.61–1.24)
GFR, EST AFRICAN AMERICAN: 7 mL/min — AB (ref >60)
GFR, EST NON AFRICAN AMERICAN: 6 mL/min — AB (ref >60)
Glucose, Bld: 132 mg/dL — ABNORMAL HIGH (ref 70–99)
POTASSIUM: 4.2 mmol/L (ref 3.5–5.1)
Phosphorus: 3.3 mg/dL (ref 2.5–4.6)
Sodium: 137 mmol/L (ref 135–145)

## 2017-12-26 LAB — LIPID PANEL
CHOL/HDL RATIO: 2.5 ratio
CHOLESTEROL: 101 mg/dL (ref 0–200)
HDL: 41 mg/dL (ref >40)
LDL Cholesterol: 41 mg/dL (ref 0–99)
TRIGLYCERIDES: 96 mg/dL (ref <150)
VLDL: 19 mg/dL (ref 0–40)

## 2017-12-26 LAB — VALPROIC ACID LEVEL: Valproic Acid Lvl: 14 ug/mL — ABNORMAL LOW (ref 50.0–100.0)

## 2017-12-26 LAB — HEMOGLOBIN A1C
HEMOGLOBIN A1C: 5.8 % — AB (ref 4.8–5.6)
MEAN PLASMA GLUCOSE: 119.76 mg/dL

## 2017-12-26 LAB — MRSA PCR SCREENING: MRSA BY PCR: NEGATIVE

## 2017-12-26 LAB — CBC
HCT: 41.7 % (ref 39.0–52.0)
HEMATOCRIT: 41.6 % (ref 39.0–52.0)
Hemoglobin: 12.9 g/dL — ABNORMAL LOW (ref 13.0–17.0)
Hemoglobin: 12.9 g/dL — ABNORMAL LOW (ref 13.0–17.0)
MCH: 31.8 pg (ref 26.0–34.0)
MCH: 31.9 pg (ref 26.0–34.0)
MCHC: 30.9 g/dL (ref 30.0–36.0)
MCHC: 31 g/dL (ref 30.0–36.0)
MCV: 102.7 fL — ABNORMAL HIGH (ref 78.0–100.0)
MCV: 102.7 fL — AB (ref 78.0–100.0)
PLATELETS: 129 10*3/uL — AB (ref 150–400)
Platelets: 115 10*3/uL — ABNORMAL LOW (ref 150–400)
RBC: 4.06 MIL/uL — ABNORMAL LOW (ref 4.22–5.81)
RBC: 4.05 MIL/uL — ABNORMAL LOW (ref 4.22–5.81)
RDW: 14.9 % (ref 11.5–15.5)
RDW: 14.9 % (ref 11.5–15.5)
WBC: 5.2 10*3/uL (ref 4.0–10.5)
WBC: 5.6 10*3/uL (ref 4.0–10.5)

## 2017-12-26 LAB — ECHOCARDIOGRAM COMPLETE
Height: 68 in
WEIGHTICAEL: 2720 [oz_av]

## 2017-12-26 LAB — TROPONIN I
TROPONIN I: 0.35 ng/mL — AB (ref <0.03)
TROPONIN I: 0.36 ng/mL — AB (ref <0.03)
Troponin I: 0.47 ng/mL (ref <0.03)

## 2017-12-26 LAB — PROTIME-INR
INR: 5.14 — AB
Prothrombin Time: 47.1 seconds — ABNORMAL HIGH (ref 11.4–15.2)

## 2017-12-26 MED ORDER — CHLORHEXIDINE GLUCONATE CLOTH 2 % EX PADS
6.0000 | MEDICATED_PAD | Freq: Every day | CUTANEOUS | Status: DC
  Administered 2017-12-26 – 2017-12-29 (×4): 6 via TOPICAL

## 2017-12-26 MED ORDER — ORAL CARE MOUTH RINSE
15.0000 mL | Freq: Two times a day (BID) | OROMUCOSAL | Status: DC
  Administered 2017-12-26 – 2018-01-01 (×6): 15 mL via OROMUCOSAL

## 2017-12-26 MED ORDER — HYDROCODONE-ACETAMINOPHEN 5-325 MG PO TABS
ORAL_TABLET | ORAL | Status: AC
  Administered 2017-12-26: 1
  Filled 2017-12-26: qty 1

## 2017-12-26 MED ORDER — RENA-VITE PO TABS
1.0000 | ORAL_TABLET | Freq: Every day | ORAL | Status: DC
  Administered 2017-12-26 – 2018-01-01 (×7): 1 via ORAL
  Filled 2017-12-26 (×7): qty 1

## 2017-12-26 MED ORDER — LEVOTHYROXINE SODIUM 25 MCG PO TABS
175.0000 ug | ORAL_TABLET | Freq: Every day | ORAL | Status: DC
  Administered 2017-12-26 – 2018-01-01 (×7): 175 ug via ORAL
  Filled 2017-12-26 (×8): qty 1

## 2017-12-26 MED ORDER — NITROGLYCERIN 0.4 MG SL SUBL: 0.4000 mg | SUBLINGUAL_TABLET | SUBLINGUAL | Status: DC | PRN

## 2017-12-26 MED ORDER — ACETAMINOPHEN 325 MG PO TABS
650.0000 mg | ORAL_TABLET | Freq: Four times a day (QID) | ORAL | Status: DC | PRN
  Administered 2017-12-30: 650 mg via ORAL
  Filled 2017-12-26: qty 2

## 2017-12-26 MED ORDER — ACETAMINOPHEN 650 MG RE SUPP: 650.0000 mg | Freq: Four times a day (QID) | RECTAL | Status: DC | PRN

## 2017-12-26 MED ORDER — FAMOTIDINE 20 MG PO TABS: 40.0000 mg | ORAL_TABLET | Freq: Every day | ORAL | Status: DC

## 2017-12-26 MED ORDER — INSULIN ASPART 100 UNIT/ML ~~LOC~~ SOLN
0.0000 [IU] | Freq: Three times a day (TID) | SUBCUTANEOUS | Status: DC
  Administered 2017-12-28 – 2017-12-31 (×4): 1 [IU] via SUBCUTANEOUS

## 2017-12-26 MED ORDER — SODIUM CHLORIDE 0.9% FLUSH: 3.0000 mL | INTRAVENOUS | Status: DC | PRN

## 2017-12-26 MED ORDER — SODIUM CHLORIDE 0.9 % IV SOLN: 250.0000 mL | INTRAVENOUS | Status: DC | PRN

## 2017-12-26 MED ORDER — POLYETHYLENE GLYCOL 3350 17 G PO PACK: 17.0000 g | PACK | Freq: Every day | ORAL | Status: DC | PRN

## 2017-12-26 MED ORDER — SODIUM CHLORIDE 0.9% FLUSH
3.0000 mL | Freq: Two times a day (BID) | INTRAVENOUS | Status: DC
  Administered 2017-12-26 – 2018-01-02 (×8): 3 mL via INTRAVENOUS

## 2017-12-26 MED ORDER — MEMANTINE HCL 10 MG PO TABS
5.0000 mg | ORAL_TABLET | Freq: Every day | ORAL | Status: DC
  Administered 2017-12-26 – 2018-01-01 (×7): 5 mg via ORAL
  Filled 2017-12-26 (×7): qty 1

## 2017-12-26 MED ORDER — SODIUM CHLORIDE 0.9% FLUSH
3.0000 mL | Freq: Two times a day (BID) | INTRAVENOUS | Status: DC
  Administered 2017-12-26 – 2018-01-01 (×11): 3 mL via INTRAVENOUS

## 2017-12-26 MED ORDER — INSULIN ASPART 100 UNIT/ML ~~LOC~~ SOLN
0.0000 [IU] | Freq: Every day | SUBCUTANEOUS | Status: DC
  Administered 2017-12-27: 2 [IU] via SUBCUTANEOUS

## 2017-12-26 MED ORDER — ATORVASTATIN CALCIUM 80 MG PO TABS
80.0000 mg | ORAL_TABLET | Freq: Every day | ORAL | Status: DC
  Administered 2017-12-26 – 2017-12-30 (×4): 80 mg via ORAL
  Filled 2017-12-26 (×4): qty 1

## 2017-12-26 MED ORDER — FUROSEMIDE 10 MG/ML IJ SOLN
80.0000 mg | Freq: Once | INTRAMUSCULAR | Status: AC
  Administered 2017-12-26: 80 mg via INTRAVENOUS
  Filled 2017-12-26: qty 8

## 2017-12-26 MED ORDER — DOXYCYCLINE HYCLATE 100 MG PO TABS: 100.0000 mg | ORAL_TABLET | ORAL | Status: DC

## 2017-12-26 MED ORDER — ASPIRIN 81 MG PO CHEW
324.0000 mg | CHEWABLE_TABLET | Freq: Once | ORAL | Status: AC
  Administered 2017-12-26: 324 mg via ORAL
  Filled 2017-12-26: qty 4

## 2017-12-26 MED ORDER — ONDANSETRON HCL 4 MG/2ML IJ SOLN: 4.0000 mg | Freq: Four times a day (QID) | INTRAMUSCULAR | Status: DC | PRN

## 2017-12-26 MED ORDER — FAMOTIDINE 20 MG PO TABS
20.0000 mg | ORAL_TABLET | Freq: Every day | ORAL | Status: DC
  Administered 2017-12-26 – 2018-01-01 (×7): 20 mg via ORAL
  Filled 2017-12-26 (×7): qty 1

## 2017-12-26 MED ORDER — CALCIUM ACETATE (PHOS BINDER) 667 MG PO CAPS
1334.0000 mg | ORAL_CAPSULE | Freq: Two times a day (BID) | ORAL | Status: DC
  Administered 2017-12-26 – 2018-01-01 (×11): 1334 mg via ORAL
  Filled 2017-12-26 (×11): qty 2

## 2017-12-26 MED ORDER — ONDANSETRON HCL 4 MG PO TABS: 4.0000 mg | ORAL_TABLET | Freq: Four times a day (QID) | ORAL | Status: DC | PRN

## 2017-12-26 MED ORDER — HYDROCODONE-ACETAMINOPHEN 5-325 MG PO TABS
1.0000 | ORAL_TABLET | ORAL | Status: DC | PRN
  Administered 2017-12-26: 1 via ORAL
  Filled 2017-12-26: qty 1

## 2017-12-26 MED ORDER — INSULIN GLARGINE 100 UNIT/ML ~~LOC~~ SOLN
12.0000 [IU] | Freq: Every day | SUBCUTANEOUS | Status: DC
  Administered 2017-12-26 – 2017-12-31 (×6): 12 [IU] via SUBCUTANEOUS
  Filled 2017-12-26 (×7): qty 0.12

## 2017-12-26 MED ORDER — ALBUTEROL SULFATE (2.5 MG/3ML) 0.083% IN NEBU
5.0000 mg | INHALATION_SOLUTION | Freq: Once | RESPIRATORY_TRACT | Status: AC
  Administered 2017-12-26: 5 mg via RESPIRATORY_TRACT
  Filled 2017-12-26 (×2): qty 6

## 2017-12-26 MED ORDER — VITAMIN D 1000 UNITS PO TABS
1000.0000 [IU] | ORAL_TABLET | Freq: Every day | ORAL | Status: DC
  Administered 2017-12-26 – 2018-01-01 (×7): 1000 [IU] via ORAL
  Filled 2017-12-26 (×8): qty 1

## 2017-12-26 MED ORDER — MORPHINE SULFATE (PF) 4 MG/ML IV SOLN
3.0000 mg | INTRAVENOUS | Status: DC | PRN
Start: 2017-12-26 — End: 2017-12-28

## 2017-12-26 MED ORDER — SENNOSIDES-DOCUSATE SODIUM 8.6-50 MG PO TABS
1.0000 | ORAL_TABLET | Freq: Every evening | ORAL | Status: DC | PRN
  Administered 2017-12-30: 1 via ORAL
  Filled 2017-12-26: qty 1

## 2017-12-26 MED ORDER — DIVALPROEX SODIUM 125 MG PO CSDR
125.0000 mg | DELAYED_RELEASE_CAPSULE | Freq: Every day | ORAL | Status: DC
  Administered 2017-12-26 – 2017-12-30 (×5): 125 mg via ORAL
  Filled 2017-12-26 (×5): qty 1

## 2017-12-26 NOTE — ED Notes (Signed)
Peter Gentles, MD aware of INR of 5.14

## 2017-12-26 NOTE — Progress Notes (Signed)
Echocardiogram 2D Echocardiogram has been performed.  [name] 12/26/2017, 9:20 AM

## 2017-12-26 NOTE — Procedures (Signed)
Patient seen on Hemodialysis. QB 400, UF goal 3L Treatment adjusted as needed.  Elmarie Shiley MD [facility] Office # [phone] Pager # [phone] 1:38 PM

## 2017-12-26 NOTE — H&P (Signed)
History and Physical    [name] OVZ:[id number] DOB: December 04, 1933 DOA: 12/26/2017  PCP: Leanna Battles, MD   Patient coming from: Home   Chief Complaint: Acute SOB   HPI: [name] is a 84 y.o. male with medical history significant for insulin-dependent diabetes mellitus, paroxysmal atrial fibrillation on warfarin, hypothyroidism, hypertension, dementia, CAD status post CABG, and end-stage renal disease on hemodialysis, now presenting to the emergency department for evaluation of acute shortness of breath.  Patient is accompanied by his wife who assists with the history.  He has reportedly been complaining of shortness of breath and chest pain intermittently for the past 2 months, but woke overnight with acute shortness of breath and cough.  He reports pain "all over."  He also acknowledges ongoing chest pain.  He is not very active at all and unable to identify any alleviating or exacerbating factors.  Denies any missed or incomplete dialysis sessions recently.  Denies dietary indiscretion. Reports recent UTI diagnosis at dialysis, now status-post treatment.   ED Course: Upon arrival to the ED, patient is found to be saturating 100% on 2 L/min supplemental oxygen, slightly tachypneic, and with vitals otherwise stable.  EKG features a sinus rhythm with LBBB.  Chest x-ray is notable for cardiomegaly with minimal vascular congestion and hazy edema or atelectasis at the bases, as well as small bilateral pleural effusions.  Chemistry panel is notable for a bicarbonate of 33.  CBC features an improved chronic thrombocytopenia with platelets now 129,000.  INR is supratherapeutic at 5.14 and troponin is elevated to 0.30.  Patient was treated with albuterol neb in the ED.  He remains hemodynamically stable and continues to complain of shortness of breath and pain "all over" including his chest.  He will be admitted for ongoing evaluation and management.  Review of Systems:  All other systems  reviewed and apart from HPI, are negative.  Past Medical History:  Diagnosis Date  . Atrial fibrillation ([place]) Aug. 2015   a. Dx 01/2014, s/p DCCV 03/06/14.  Marland Kitchen CAD (coronary artery disease)    a. prior CABG in 1997 with redo in 2000. b. last cath in 2008 - managed medically  . Carotid disease, bilateral ([place])    with multiple bilateral carotid surgeries  . Chronic diastolic CHF (congestive heart failure) ([place])   . Chronic respiratory failure with hypoxia ([place])   . CKD (chronic kidney disease), stage IV ([place])    a. Has fistula in place.   . Degenerative joint disease   . Dementia   . Generalized weakness    without overt findings  . GERD (gastroesophageal reflux disease)   . Gout   . Heart murmur   . Hyperlipidemia   . Hypertension   . Hypothyroidism   . Meniere's disease   . On home oxygen therapy    "2L" qhs (09/09/2015  . Orthopnea    Two-pillow  . Peripheral vascular disease ([place])   . Psoriasis   . Shingles   . Sinus bradycardia    a. Toprol D/C'd due to this.   . Type 2 diabetes mellitus ([place])     Past Surgical History:  Procedure Laterality Date  . APPENDECTOMY    . AV FISTULA PLACEMENT Left 01/21/2013   Procedure: ARTERIOVENOUS (AV) FISTULA CREATION, Left Brachiocephalic;  Surgeon: Angelia Mould, MD;  Location: [facility];  Service: Vascular;  Laterality: Left;  . BLEPHAROPLASTY Bilateral   . CARDIAC CATHETERIZATION    . CARDIAC CATHETERIZATION  2008   L  main irreg, LAD 80%, IMA-LAD & SVG-Diag patent, CFX 100%, SVG-OM 90%, RCA 70%, SVG-RCA OK, EF nl, med rx, no vessels appropriate for PCI  . CARDIAC CATHETERIZATION N/A 01/12/2016   Procedure: Left Heart Cath and Cors/Grafts Angiography;  Surgeon: Belva Crome, MD;  Location: [facility];  Service: Cardiovascular;  Laterality: N/A;  . CARDIOVERSION N/A 03/06/2014   Procedure: CARDIOVERSION;  Surgeon: Larey Dresser, MD;  Location: [facility];  Service: Cardiovascular;  Laterality: N/A;  . CAROTID  ENDARTERECTOMY Bilateral "several times"   "5 on right; 2-3 on left" (09/09/2015)  . CORONARY ARTERY BYPASS GRAFT  1997;  2002  . FRACTURE SURGERY    . LUMBAR LAMINECTOMY  July 2001  . LUNG REMOVAL, PARTIAL    . SHOULDER SURGERY Bilateral   . TEE WITHOUT CARDIOVERSION N/A 03/06/2014   Procedure: TRANSESOPHAGEAL ECHOCARDIOGRAM (TEE);  Surgeon: Larey Dresser, MD;  Location: [facility];  Service: Cardiovascular;  Laterality: N/A;  . THORACOTOMY Left    due to fungal infection     reports that he has never smoked. He has never used smokeless tobacco. He reports that he does not drink alcohol or use drugs.  Allergies  Allergen Reactions  . Betadine [Povidone Iodine] Rash  . Iodinated Diagnostic Agents Rash and Other (See Comments)    Does fine with premedications for cath  . Latex Hives  . Tape Rash    MUST BE FREE OF ANY LATEX    Family History  Problem Relation Age of Onset  . Heart attack Father   . Heart disease Father        After 56 yrs of age  . Hyperlipidemia Father   . Hypertension Father   . Diabetes Mother   . Hypertension Mother   . Heart disease Mother   . Heart disease Brother   . Diabetes Brother      Prior to Admission medications   Medication Sig Start Date End Date Taking? Authorizing Provider  acetaminophen (TYLENOL) 325 MG tablet Take 2 tablets (650 mg total) by mouth every 4 (four) hours as needed for headache or mild pain. 09/29/15  Yes Clegg, Amy D, NP  atorvastatin (LIPITOR) 80 MG tablet TAKE ONE TABLET BY MOUTH ONCE DAILY 02/26/15  Yes Nahser, Wonda Cheng, MD  calcium acetate (PHOSLO) 667 MG capsule Take 1,334 mg by mouth 2 (two) times daily with a meal.    Yes [provider]  Cholecalciferol (VITAMIN D-3) 1000 units CAPS Take 1,000 Units by mouth daily.   Yes [provider]  divalproex (DEPAKOTE SPRINKLE) 125 MG capsule Take 125 mg by mouth daily.  11/30/16  Yes [provider]  doxycycline (VIBRA-TABS) 100 MG tablet Take 100  mg by mouth See admin instructions. Take 100 mg by mouth two times a day for 7 days; do not take within 3 hours of calcium acaetate 12/24/17  Yes [provider]  insulin aspart (NOVOLOG FLEXPEN) 100 UNIT/ML FlexPen Inject 8 Units into the skin 2 (two) times daily before a meal.   Yes [provider]  Insulin Glargine (LANTUS SOLOSTAR) 100 UNIT/ML SOPN Inject 24 Units into the skin daily before breakfast.    Yes [provider]  ipratropium (ATROVENT) 0.03 % nasal spray Place 2 sprays into both nostrils 3 (three) times daily as needed for rhinitis.  11/19/17  Yes [provider]  levothyroxine (SYNTHROID, LEVOTHROID) 175 MCG tablet Take 175 mcg by mouth daily before breakfast.   Yes [provider]  memantine (NAMENDA) 5 MG tablet Take 5 mg by mouth daily.  03/12/15  Yes [provider]  multivitamin (RENA-VIT) TABS tablet Take 1 tablet by mouth daily.   Yes [provider]  nitroGLYCERIN (NITROSTAT) 0.4 MG SL tablet Place 1 tablet (0.4 mg total) under the tongue every 5 (five) minutes as needed for chest pain. 07/12/17  Yes Nahser, Wonda Cheng, MD  polyethylene glycol [facility] / GLYCOLAX) packet Take 17 g by mouth daily as needed for mild constipation.   Yes [provider]  ranitidine (ZANTAC) 300 MG tablet Take 300 mg by mouth daily. 12/04/15  Yes [provider]  triamcinolone cream (KENALOG) 0.1 % Apply 1 application topically 2 (two) times daily as needed (for breakouts).  12/18/17  Yes [provider]  warfarin (COUMADIN) 5 MG tablet USE AS DIRECTED 06/14/17  Yes Nahser, Wonda Cheng, MD  clindamycin (CLEOCIN) 300 MG capsule Take 1 capsule (300 mg total) by mouth 3 (three) times daily. Patient not taking: Reported on 12/26/2017 10/02/17   Trula Slade, DPM  mupirocin ointment (BACTROBAN) 2 % Place 1 application into the nose 2 (two) times daily. Patient not taking: Reported on 12/26/2017 01/24/17   Roney Jaffe, MD    silver sulfADIAZINE (SILVADENE) 1 % cream Apply 1 application topically daily. Patient not taking: Reported on 12/26/2017 02/13/17   Gean Birchwood, DPM    Physical Exam: Vitals:   12/26/17 0315 12/26/17 0330 12/26/17 0345 12/26/17 0415  BP: (!) 120/56 (!) 122/57 (!) 119/57 (!) 125/59  Pulse: 66 68 64 64  Resp: (!) 30 16 (!) 21 18  SpO2: 100% 100% 100% 100%  Weight:      Height:          Constitutional: No tin acute distress, calm  Eyes: PERTLA, lids and conjunctivae normal ENMT: Mucous membranes are moist. Posterior pharynx clear of any exudate or lesions.   Neck: normal, supple, no masses, no thyromegaly Respiratory: Slightly diminished at the bases with fine rales bilaterally, mild tachypnea. No accessory muscle use.  Cardiovascular: S1 & S2 heard, regular rate and rhythm. Trace pretibial edema bilaterally. Abdomen: No distension, soft, mild generalized tenderness. Bowel sounds normal.  Musculoskeletal: no clubbing / cyanosis. No joint deformity upper and lower extremities.    Skin: no significant rashes, lesions, ulcers. Warm, dry, well-perfused. Neurologic: No gross facial asymmetry, hearing deficit. Sensation to light touch intact. Strength 5/5 in all 4 limbs.  Psychiatric: Alert and oriented to person, place, and situation. Calm, cooperative.     Labs on Admission: I have personally reviewed following labs and imaging studies  CBC: Recent Labs  Lab 12/26/17 0145  WBC 5.2  HGB 12.9*  HCT 41.7  MCV 102.7*  PLT 453*   Basic Metabolic Panel: Recent Labs  Lab 12/26/17 0145  NA 139  K 4.2  CL 94*  CO2 33*  GLUCOSE 131*  BUN 21  CREATININE 6.45*  CALCIUM 9.5   GFR: Estimated Creatinine Clearance: 8.7 mL/min (A) (by C-G formula based on SCr of 6.45 mg/dL (H)). Liver Function Tests: No results for input(s): AST, ALT, ALKPHOS, BILITOT, PROT, ALBUMIN in the last 168 hours. No results for input(s): LIPASE, AMYLASE in the last 168 hours. No results for  input(s): AMMONIA in the last 168 hours. Coagulation Profile: Recent Labs  Lab 12/26/17 0223  INR 5.14*   Cardiac Enzymes: No results for input(s): CKTOTAL, CKMB, CKMBINDEX, TROPONINI in the last 168 hours. BNP (last 3 results) No results for input(s): PROBNP in  the last 8760 hours. HbA1C: No results for input(s): HGBA1C in the last 72 hours. CBG: No results for input(s): GLUCAP in the last 168 hours. Lipid Profile: No results for input(s): CHOL, HDL, LDLCALC, TRIG, CHOLHDL, LDLDIRECT in the last 72 hours. Thyroid Function Tests: No results for input(s): TSH, T4TOTAL, FREET4, T3FREE, THYROIDAB in the last 72 hours. Anemia Panel: No results for input(s): VITAMINB12, FOLATE, FERRITIN, TIBC, IRON, RETICCTPCT in the last 72 hours. Urine analysis:    Component Value Date/Time   COLORURINE YELLOW 09/17/2015 1815   APPEARANCEUR CLOUDY (A) 09/17/2015 1815   LABSPEC 1.010 09/17/2015 1815   PHURINE 5.0 09/17/2015 1815   GLUCOSEU NEGATIVE 09/17/2015 1815   HGBUR SMALL (A) 09/17/2015 1815   BILIRUBINUR NEGATIVE 09/17/2015 1815   KETONESUR NEGATIVE 09/17/2015 1815   PROTEINUR NEGATIVE 09/17/2015 1815   UROBILINOGEN 1.0 03/23/2014 2156   NITRITE NEGATIVE 09/17/2015 1815   LEUKOCYTESUR LARGE (A) 09/17/2015 1815   Sepsis Labs: @LABRCNTIP (procalcitonin:4,lacticidven:4) )No results found for this or any previous visit (from the past 240 hour(s)).   Radiological Exams on Admission: Dg Chest 2 View  Result Date: 12/26/2017 CLINICAL DATA:  Shortness of breath EXAM: CHEST - 2 VIEW COMPARISON:  01/22/2017 FINDINGS: Post sternotomy changes. Cardiomegaly with mild vascular congestion. There are small pleural effusions. Hazy edema or atelectasis at the bases. Aortic atherosclerosis. No pneumothorax. IMPRESSION: 1. Cardiomegaly with minimal vascular congestion and hazy edema or atelectasis at the bases. Small pleural effusions. Electronically Signed   By: Donavan Foil M.D.   On: 12/26/2017 02:30     EKG: Independently reviewed. Sinus rhythm, LBBB.   Assessment/Plan   1. Acute on chronic diastolic CHF  - Presents with acute shortness of breath, reporting intermittent SOB and chest pain over the past couple months that worsened acutely overnight  - He is found to have mild pulm edema, not in acute distress  - He still urinates and will be given a dose of IV Lasix  - SLIV, follow daily wt and I/O's, contact nephrology for inpatient HD   2. NSTEMI  - Presents with acute SOB, reporting pain "all over" including chest  - Troponin elevated to 0.30 and EKG with LBBB not seen on priors  - Discussed with on-call cardiology and recommended to continue cardiac monitoring and trend troponin  - Likely type II in setting of pulm edema and acute SOB  - Treated with ASA 324, continue Lipitor, continue cardiac monitoring, trend troponin    3. ESRD  - Reports completing HD on 7/1  - No hyperkalemia, acidosis, uremia, or hypertension on admission, but he is dyspneic with suspected mild pulm edema  - SLIV, fluid-restrict diet   4. Paroxysmal atrial fibrillation; supratherapeutic INR  - In a sinus rhythm on admission  - CHADS-VASc is at least 5 (age x2, CAD, CHF, DM)  - INR is 5.14 on admission without bleeding  - Check daily INR and hold warfarin until <3   5. Dementia  - Continue Namenda and Depakote   6. Insulin-dependent DM  - A1c was 6.3% in 2017  - Managed at home with Lantus 24 units qAM and Novolog 8 units BID  - Follow CBG's, continue Lantus with dose-reduction to start, plus SSI with Novolog   7. Hypothyroidism  - Continue Synthroid    DVT prophylaxis: warfarin  Code Status: DNR Family Communication: Wife updated at bedside Consults called: Cardiology kindly reviewed case on admission, not formally consulted  Admission status: [place],  MD [facility] Pager [phone]  If 7PM-7AM, please contact night-coverage [email or web address] Password  [facility]  12/26/2017, 4:29 AM

## 2017-12-26 NOTE — Progress Notes (Addendum)
Pt and family questioning about pt Doxycycline. Per pt wife patient started taking med 7/1 at night and has only taken three doses and has not finished complete treatment. Provider paged and made aware. RN will continue to monitor pt.

## 2017-12-26 NOTE — ED Triage Notes (Signed)
Pt arrived EMS with c/o shortness of breath x 2 mos, worsening tonight. Denies chest pain. Pt does dialysis MWF, per EMS pt has not missed any treatments.

## 2017-12-26 NOTE — Progress Notes (Signed)
PROGRESS NOTE    [name]  FMB:[id number] DOB: 03/15/1934 DOA: 12/26/2017 PCP: Leanna Battles, MD   Brief Narrative:  84 y.o. WM  PMHx, dementia, Diabetes type 2 uncontrolled with complication, Paroxysmal A. fib on warfarin, HTN,CAD status post CABG, Chronic Respiratory Failure with Hypoxia, hypothyroidism, ESRD on HD   Presenting to the emergency department for evaluation of acute shortness of breath.  Patient is accompanied by his wife who assists with the history.  He has reportedly been complaining of shortness of breath and chest pain intermittently for the past 2 months, but woke overnight with acute shortness of breath and cough.  He reports pain "all over."  He also acknowledges ongoing chest pain.  He is not very active at all and unable to identify any alleviating or exacerbating factors.  Denies any missed or incomplete dialysis sessions recently.  Denies dietary indiscretion. Reports recent UTI diagnosis at dialysis, now status-post treatment.    ED Course: Upon arrival to the ED, patient is found to be saturating 100% on 2 L/min supplemental oxygen, slightly tachypneic, and with vitals otherwise stable.  EKG features a sinus rhythm with LBBB.  Chest x-ray is notable for cardiomegaly with minimal vascular congestion and hazy edema or atelectasis at the bases, as well as small bilateral pleural effusions.  Chemistry panel is notable for a bicarbonate of 33.  CBC features an improved chronic thrombocytopenia with platelets now 129,000.  INR is supratherapeutic at 5.14 and troponin is elevated to 0.30.  Patient was treated with albuterol neb in the ED.  He remains hemodynamically stable and continues to complain of shortness of breath and pain "all over" including his chest.  He will be admitted for ongoing evaluation and management.    Subjective: 7/3 A/O x4, easily confused but redirectable.  Negative CP, negative S OB, negative abdominal pain.  States he uses 2 L O2 via [place] at  night.  Patient's wife states has missed HD once or twice in the last 2 to 3 weeks.    Assessment & Plan:   Principal Problem:   Acute on chronic diastolic heart failure (HCC) Active Problems:   Diabetes mellitus with peripheral vascular disease (HCC)   CAD (coronary artery disease)   Atrial fibrillation (HCC)   Hypothyroidism   ESRD on dialysis ([place])   Chest pain   Supratherapeutic INR   Elevated troponin  Acute on Chronic Diastolic CHF -Multifactorial NSTEMI, fluid overload -Strict in and out -Daily weight - Transfuse for hemoglobin<8 -Appears to be subacute process as wife states has been worsening over the past couple months: Increasing S OB.  Patient refusing to come to hospital. - Has only missed a couple of HD sessions per wife in past 2 to 3 weeks - Make some urine was treated with dose of IV Lasix -Echocardiogram pending   NSTEMI -Trend troponin Recent Labs  Lab 12/26/17 0503  TROPONINI 0.35*  -See CHF   ESRD HD M/W/F? - Reports completing HD on 7/1  -Per patient is a patient of Dr. Dr. [facility]. -Discussed case with Dr. Elmarie Shiley nephrology  Paroxysmal atrial fibrillation (CHADSVASc5+)/supratherapeutic INR -Currently NSR - Warfarin 5mg  Sun/Tue/Thur/Sat; 7.5 M/W/F (Hold) until [employer] 12/26/17 0223  INR 5.14*  -Currently negative signs of active bleeding.   Dementia -Continue Namenda and Depakote     Diabetes type 2 -Hemoglobin A1c pending -Lipid panel pending -Lantus 12 units  - sensitive SSI   Hypothyroidism - Synthroid 175 mcg daily  DVT prophylaxis: Warfarin (hold) Code Status: DNR Family Communication: Wife at bedside for discussion plan of care Disposition Plan: TBD   Consultants:  Nephrology     Procedures/Significant Events:     I have personally reviewed and interpreted all radiology studies and my findings are as above.  VENTILATOR  SETTINGS:    Cultures   Antimicrobials:    Devices    LINES / TUBES:      Continuous Infusions: . sodium chloride       Objective: Vitals:   12/26/17 0415 12/26/17 0515 12/26/17 0559 12/26/17 0600  BP: (!) 125/59 120/60 130/62 130/62  Pulse: 64 64 69 69  Resp: 18 (!) 23 (!) 23 (!) 27  Temp:   97.7 F (36.5 C)   TempSrc:   Oral   SpO2: 100% 100% 100% 100%  Weight:      Height:       No intake or output data in the 24 hours ending 12/26/17 0726 Filed Weights   12/26/17 0243  Weight: 170 lb (77.1 kg)    Examination:  General: A/O x4, easily confused.  Follows all commands.  No acute respiratory distress, cachectic Neck:  Negative scars, masses, torticollis, lymphadenopathy, JVD Lungs: Clear to auscultation bilaterally without wheezes or crackles Cardiovascular: Regular rate and rhythm without murmur gallop or rub normal S1 and S2 Abdomen: negative abdominal pain, nondistended, positive soft, bowel sounds, no rebound, no ascites, no appreciable mass Extremities: No significant cyanosis, clubbing, or edema bilateral lower extremities Skin: Midline chest scar consistent with CABG  Psychiatric:  Negative depression, negative anxiety, negative fatigue, negative mania,  Central nervous system:  Cranial nerves II through XII intact, tongue/uvula midline, all extremities muscle strength 5/5, sensation intact throughout,  negative dysarthria, negative expressive aphasia, negative receptive aphasia.  .     Data Reviewed: Care during the described time interval was provided by me .  I have reviewed this patient's available data, including medical history, events of note, physical examination, and all test results as part of my evaluation.   CBC: Recent Labs  Lab 12/26/17 0145  WBC 5.2  HGB 12.9*  HCT 41.7  MCV 102.7*  PLT 948*   Basic Metabolic Panel: Recent Labs  Lab 12/26/17 0145  NA 139  K 4.2  CL 94*  CO2 33*  GLUCOSE 131*  BUN 21  CREATININE  6.45*  CALCIUM 9.5   GFR: Estimated Creatinine Clearance: 8.7 mL/min (A) (by C-G formula based on SCr of 6.45 mg/dL (H)). Liver Function Tests: No results for input(s): AST, ALT, ALKPHOS, BILITOT, PROT, ALBUMIN in the last 168 hours. No results for input(s): LIPASE, AMYLASE in the last 168 hours. No results for input(s): AMMONIA in the last 168 hours. Coagulation Profile: Recent Labs  Lab 12/26/17 0223  INR 5.14*   Cardiac Enzymes: Recent Labs  Lab 12/26/17 0503  TROPONINI 0.35*   BNP (last 3 results) No results for input(s): PROBNP in the last 8760 hours. HbA1C: No results for input(s): HGBA1C in the last 72 hours. CBG: No results for input(s): GLUCAP in the last 168 hours. Lipid Profile: No results for input(s): CHOL, HDL, LDLCALC, TRIG, CHOLHDL, LDLDIRECT in the last 72 hours. Thyroid Function Tests: No results for input(s): TSH, T4TOTAL, FREET4, T3FREE, THYROIDAB in the last 72 hours. Anemia Panel: No results for input(s): VITAMINB12, FOLATE, FERRITIN, TIBC, IRON, RETICCTPCT in the last 72 hours. Urine analysis:    Component Value Date/Time   COLORURINE YELLOW 09/17/2015 1815   APPEARANCEUR CLOUDY (A) 09/17/2015  1815   LABSPEC 1.010 09/17/2015 1815   PHURINE 5.0 09/17/2015 1815   GLUCOSEU NEGATIVE 09/17/2015 1815   HGBUR SMALL (A) 09/17/2015 1815   BILIRUBINUR NEGATIVE 09/17/2015 1815   KETONESUR NEGATIVE 09/17/2015 1815   PROTEINUR NEGATIVE 09/17/2015 1815   UROBILINOGEN 1.0 03/23/2014 2156   NITRITE NEGATIVE 09/17/2015 1815   LEUKOCYTESUR LARGE (A) 09/17/2015 1815   Sepsis Labs: @LABRCNTIP (procalcitonin:4,lacticidven:4)  )No results found for this or any previous visit (from the past 240 hour(s)).       Radiology Studies: Dg Chest 2 View  Result Date: 12/26/2017 CLINICAL DATA:  Shortness of breath EXAM: CHEST - 2 VIEW COMPARISON:  01/22/2017 FINDINGS: Post sternotomy changes. Cardiomegaly with mild vascular congestion. There are small pleural  effusions. Hazy edema or atelectasis at the bases. Aortic atherosclerosis. No pneumothorax. IMPRESSION: 1. Cardiomegaly with minimal vascular congestion and hazy edema or atelectasis at the bases. Small pleural effusions. Electronically Signed   By: Donavan Foil M.D.   On: 12/26/2017 02:30        Scheduled Meds: . atorvastatin  80 mg Oral q1800  . calcium acetate  1,334 mg Oral BID WC  . cholecalciferol  1,000 Units Oral Daily  . divalproex  125 mg Oral Daily  . famotidine  40 mg Oral Daily  . insulin aspart  0-5 Units Subcutaneous QHS  . insulin aspart  0-9 Units Subcutaneous TID WC  . insulin glargine  12 Units Subcutaneous Daily  . levothyroxine  175 mcg Oral QAC breakfast  . mouth rinse  15 mL Mouth Rinse BID  . memantine  5 mg Oral Daily  . multivitamin  1 tablet Oral Daily  . sodium chloride flush  3 mL Intravenous Q12H  . sodium chloride flush  3 mL Intravenous Q12H   Continuous Infusions: . sodium chloride       LOS: 0 days    Time spent: 40 minutes    WOODS, Geraldo Docker, MD [facility] Pager [phone]   If 7PM-7AM, please contact night-coverage [email or web address] Password [email or web address] 12/26/2017, 7:26 AM

## 2017-12-26 NOTE — Consult Note (Signed)
Reason for Consult: Volume overload in patient with ESRD Referring Physician: Dia Crawford M.D. [facility])  HPI:  84 year old Caucasian man with past medical history significant for atrial fibrillation on chronic anticoagulation with warfarin, coronary artery disease status post CABG, hypertension, progressive dementia, hypothyroidism and end-stage renal disease on hemodialysis (M/W/F @ [place]).  Presents with acute worsening of shortness of breath that has apparently been progressive over the past several weeks with concomitant decreased appetite due to swallowing difficulty "feels like food gets stuck in his throat".  He has been leaving at or slightly below his estimated dry weight upon review of his dialysis records.  He denies any associated nausea, vomiting, diarrhea, cough, sputum production or chest pain.  He does report the need to clear his throat after taking his medications or eating.  Dialysis prescription: Monday/Wednesday/Friday, [facility], 4 hours, 180 dialyzer, BFR 400/DFR 800, EDW 75 kg, 2K/2.0 calcium, no UF profile, no sodium modeling, heparin 8000 units bolus, LUA AVF  Past Medical History:  Diagnosis Date  . Atrial fibrillation ([place]) Aug. 2015   a. Dx 01/2014, s/p DCCV 03/06/14.  Marland Kitchen CAD (coronary artery disease)    a. prior CABG in 1997 with redo in 2000. b. last cath in 2008 - managed medically  . Carotid disease, bilateral ([place])    with multiple bilateral carotid surgeries  . Chronic diastolic CHF (congestive heart failure) ([place])   . Chronic respiratory failure with hypoxia ([place])   . CKD (chronic kidney disease), stage IV ([place])    a. Has fistula in place.   . Degenerative joint disease   . Dementia   . Generalized weakness    without overt findings  . GERD (gastroesophageal reflux disease)   . Gout   . Heart murmur   . Hyperlipidemia   . Hypertension   . Hypothyroidism   . Meniere's disease   . On home oxygen therapy    "2L" qhs (09/09/2015  .  Orthopnea    Two-pillow  . Peripheral vascular disease ([place])   . Psoriasis   . Shingles   . Sinus bradycardia    a. Toprol D/C'd due to this.   . Type 2 diabetes mellitus ([place])     Past Surgical History:  Procedure Laterality Date  . APPENDECTOMY    . AV FISTULA PLACEMENT Left 01/21/2013   Procedure: ARTERIOVENOUS (AV) FISTULA CREATION, Left Brachiocephalic;  Surgeon: Angelia Mould, MD;  Location: [place];  Service: Vascular;  Laterality: Left;  . BLEPHAROPLASTY Bilateral   . CARDIAC CATHETERIZATION    . CARDIAC CATHETERIZATION  2008   L main irreg, LAD 80%, IMA-LAD & SVG-Diag patent, CFX 100%, SVG-OM 90%, RCA 70%, SVG-RCA OK, EF nl, med rx, no vessels appropriate for PCI  . CARDIAC CATHETERIZATION N/A 01/12/2016   Procedure: Left Heart Cath and Cors/Grafts Angiography;  Surgeon: Belva Crome, MD;  Location: [facility];  Service: Cardiovascular;  Laterality: N/A;  . CARDIOVERSION N/A 03/06/2014   Procedure: CARDIOVERSION;  Surgeon: Larey Dresser, MD;  Location: [facility];  Service: Cardiovascular;  Laterality: N/A;  . CAROTID ENDARTERECTOMY Bilateral "several times"   "5 on right; 2-3 on left" (09/09/2015)  . CORONARY ARTERY BYPASS GRAFT  1997;  2002  . FRACTURE SURGERY    . LUMBAR LAMINECTOMY  July 2001  . LUNG REMOVAL, PARTIAL    . SHOULDER SURGERY Bilateral   . TEE WITHOUT CARDIOVERSION N/A 03/06/2014   Procedure: TRANSESOPHAGEAL ECHOCARDIOGRAM (TEE);  Surgeon: Larey Dresser, MD;  [facility];  Service: Cardiovascular;  Laterality: N/A;  . THORACOTOMY Left    due to fungal infection    Family History  Problem Relation Age of Onset  . Heart attack Father   . Heart disease Father        After 10 yrs of age  . Hyperlipidemia Father   . Hypertension Father   . Diabetes Mother   . Hypertension Mother   . Heart disease Mother   . Heart disease Brother   . Diabetes Brother     Social History:  reports that he has never smoked. He has never  used smokeless tobacco. He reports that he does not drink alcohol or use drugs.  Allergies:  Allergies  Allergen Reactions  . Betadine [Povidone Iodine] Rash  . Iodinated Diagnostic Agents Rash and Other (See Comments)    Does fine with premedications for cath  . Latex Hives  . Tape Rash    MUST BE FREE OF ANY LATEX    Medications:  Scheduled: . atorvastatin  80 mg Oral q1800  . calcium acetate  1,334 mg Oral BID WC  . Chlorhexidine Gluconate Cloth  6 each Topical Q0600  . cholecalciferol  1,000 Units Oral Daily  . divalproex  125 mg Oral Daily  . famotidine  40 mg Oral Daily  . insulin aspart  0-5 Units Subcutaneous QHS  . insulin aspart  0-9 Units Subcutaneous TID WC  . insulin glargine  12 Units Subcutaneous Daily  . levothyroxine  175 mcg Oral QAC breakfast  . mouth rinse  15 mL Mouth Rinse BID  . memantine  5 mg Oral Daily  . multivitamin  1 tablet Oral Daily  . sodium chloride flush  3 mL Intravenous Q12H  . sodium chloride flush  3 mL Intravenous Q12H    BMP Latest Ref Rng & Units 12/26/2017 07/12/2017 01/24/2017  Glucose 70 - 99 mg/dL 131(H) 144(H) 225(H)  BUN 8 - 23 mg/dL 21 52(H) 47(H)  Creatinine 0.61 - 1.24 mg/dL 6.45(H) 5.58(H) 4.54(H)  BUN/Creat Ratio 10 - 24 - 9(L) -  Sodium 135 - 145 mmol/L 139 141 134(L)  Potassium 3.5 - 5.1 mmol/L 4.2 5.0 4.1  Chloride 98 - 111 mmol/L 94(L) 98 97(L)  CO2 22 - 32 mmol/L 33(H) 28 28  Calcium 8.9 - 10.3 mg/dL 9.5 9.1 8.7(L)   CBC Latest Ref Rng & Units 12/26/2017 01/25/2017 01/24/2017  WBC 4.0 - 10.5 K/uL 5.2 5.3 5.5  Hemoglobin 13.0 - 17.0 g/dL 12.9(L) 10.7(L) 10.0(L)  Hematocrit 39.0 - 52.0 % 41.7 31.7(L) 30.3(L)  Platelets 150 - 400 K/uL 129(L) 112(L) 115(L)     Dg Chest 2 View  Result Date: 12/26/2017 CLINICAL DATA:  Shortness of breath EXAM: CHEST - 2 VIEW COMPARISON:  01/22/2017 FINDINGS: Post sternotomy changes. Cardiomegaly with mild vascular congestion. There are small pleural effusions. Hazy edema or atelectasis at  the bases. Aortic atherosclerosis. No pneumothorax. IMPRESSION: 1. Cardiomegaly with minimal vascular congestion and hazy edema or atelectasis at the bases. Small pleural effusions. Electronically Signed   By: Donavan Foil M.D.   On: 12/26/2017 02:30    Review of Systems  Constitutional: Positive for malaise/fatigue. Negative for chills and fever.  HENT: Negative.   Eyes: Negative.   Respiratory: Positive for shortness of breath. Negative for cough and hemoptysis.   Cardiovascular: Negative.   Gastrointestinal: Negative for abdominal pain, diarrhea, nausea and vomiting.       Complains of problems with swallowing-dysphagia  Genitourinary: Negative.  Musculoskeletal: Negative.   Skin: Negative.   Neurological: Positive for weakness. Negative for dizziness, focal weakness and headaches.  Psychiatric/Behavioral: Positive for memory loss.   Blood pressure 130/62, pulse 69, temperature 97.7 F (36.5 C), temperature source Oral, resp. rate (!) 27, height 5\' 8"  (1.727 m), weight 77.1 kg (170 lb), SpO2 100 %. Physical Exam  Nursing note and vitals reviewed. Constitutional: He appears well-developed and well-nourished. No distress.  HENT:  Head: Normocephalic and atraumatic.  Mouth/Throat: Oropharynx is clear and moist. No oropharyngeal exudate.  Eyes: Pupils are equal, round, and reactive to light. Conjunctivae are normal. No scleral icterus.  Neck: Normal range of motion. Neck supple. JVD present. No thyromegaly present.  12 cm JVP  Cardiovascular: Normal rate and normal heart sounds.  No murmur heard. Irregularly irregular rhythm  Respiratory: Effort normal. He has no rales.  Decreased breath sounds over bases  GI: Soft. Bowel sounds are normal. There is no tenderness. There is no rebound and no guarding.  Musculoskeletal: Normal range of motion. He exhibits no edema.  LUA AVF with good thrill/bruit  Neurological: He is alert.  Skin: Skin is warm and dry. No rash noted. No erythema.     Assessment/Plan: 1.  Acute on chronic shortness of breath: This appears to be possibly from reduction of lean body mass from his recent problems with swallowing/decreased appetite.  Will attempt aggressive ultrafiltration to lower his dry weight today.  Chest x-ray with suggestion of increased vascular congestion and small bilateral pleural effusions but no florid edema noted on physical exam. 2.  End-stage renal disease: Usually on a Monday/Wednesday/Friday schedule and dialysis will be ordered for today-discussed compliance with limiting interdialytic weight gain and sodium restriction.  Stressed compliance with dialysis time. 3.  Hypertension: Fairly controlled, continue to monitor with ultrafiltration/dialysis. 4.  Anemia of chronic kidney disease: Hemoglobin/hematocrit currently at goal, not on ESA. 5.  Secondary hyperparathyroidism: We will add on phosphorus levels to labs from this morning, continue to monitor for adjustment of binder dosing (currently on calcium acetate).  Not on VDRA 6.  Dementia: Continue memantine.  Artin Mceuen K. 12/26/2017, 8:03 AM

## 2017-12-26 NOTE — ED Provider Notes (Signed)
[facility] EMERGENCY DEPARTMENT Provider Note   CSN: [id number] Arrival date & time: 12/26/17  0139     History   Chief Complaint Chief Complaint  Patient presents with  . Shortness of Breath    HPI [name] is a 84 y.o. male.  The history is provided by the patient, the spouse and a relative.  Shortness of Breath  This is a new problem. The problem occurs frequently.Episode onset: several hrs ago. The problem has been gradually worsening. Associated symptoms include cough and chest pain. Associated symptoms comments: Difficulty swallowing .  pt with h/o atrial Fibrillation, coronary disease, chronic kidney disease on dialysis presents with multiple complaints.  Family reports over the past several weeks has been reporting difficulty swallowing and lack of appetite.  Does report generalized weakness and now requires wheelchair transport. Tonight he woke up with his wife reporting pain throughout his body including his chest. He receives dialysis Monday/Wednesday/Friday.  He was recently diagnosed with a UTI at dialysis and has being treated for that. Past Medical History:  Diagnosis Date  . Atrial fibrillation ([place]) Aug. 2015   a. Dx 01/2014, s/p DCCV 03/06/14.  Marland Kitchen CAD (coronary artery disease)    a. prior CABG in 1997 with redo in 2000. b. last cath in 2008 - managed medically  . Carotid disease, bilateral ([place])    with multiple bilateral carotid surgeries  . Chronic diastolic CHF (congestive heart failure) ([place])   . Chronic respiratory failure with hypoxia ([place])   . CKD (chronic kidney disease), stage IV ([place])    a. Has fistula in place.   . Degenerative joint disease   . Dementia   . Generalized weakness    without overt findings  . GERD (gastroesophageal reflux disease)   . Gout   . Heart murmur   . Hyperlipidemia   . Hypertension   . Hypothyroidism   . Meniere's disease   . On home oxygen therapy    "2L" qhs (09/09/2015  . Orthopnea    Two-pillow  . Peripheral vascular disease ([place])   . Psoriasis   . Shingles   . Sinus bradycardia    a. Toprol D/C'd due to this.   . Type 2 diabetes mellitus [facility])     Patient Active Problem List   Diagnosis Date Noted  . Hypotension 02/10/2016  . Dehydration 02/10/2016  . Pressure ulcer 01/11/2016  . Chest pain 01/10/2016  . ESRD on dialysis ([place])   . Goals of care, counseling/discussion   . Acute on chronic diastolic heart failure ([place]) 09/09/2015  . Decreased pedal pulses 06/03/2015  . Fall 03/24/2014  . Aftercare following surgery of the circulatory system, [place] 03/18/2014  . Chronic combined systolic and diastolic CHF (congestive heart failure) ([place]) 03/18/2014  . Hypertensive heart disease with heart failure ([place]) 03/18/2014  . CKD (chronic kidney disease), stage IV ([place]) 03/18/2014  . Hypothyroidism 03/18/2014  . Encounter for therapeutic drug monitoring 02/19/2014  . Atrial fibrillation ([place]) 02/13/2014  . Peripheral vascular disease, unspecified ([place]) 11/12/2013  . Occlusion and stenosis of carotid artery without mention of cerebral infarction 03/06/2012  . DOE (dyspnea on exertion) 02/13/2012  . Hyperlipidemia 08/30/2011  . CAD (coronary artery disease) 06/19/2007  . Diabetes mellitus with peripheral vascular disease ([place]) 06/17/2007    Past Surgical History:  Procedure Laterality Date  . APPENDECTOMY    . AV FISTULA PLACEMENT Left 01/21/2013   Procedure: ARTERIOVENOUS (AV) FISTULA CREATION, Left Brachiocephalic;  Surgeon: Angelia Mould, MD;  [facility];  Service: Vascular;  Laterality: Left;  . BLEPHAROPLASTY Bilateral   . CARDIAC CATHETERIZATION    . CARDIAC CATHETERIZATION  2008   L main irreg, LAD 80%, IMA-LAD & SVG-Diag patent, CFX 100%, SVG-OM 90%, RCA 70%, SVG-RCA OK, EF nl, med rx, no vessels appropriate for PCI  . CARDIAC CATHETERIZATION N/A 01/12/2016   Procedure: Left Heart Cath and Cors/Grafts Angiography;  Surgeon: Belva Crome, MD;   Location: [facility];  Service: Cardiovascular;  Laterality: N/A;  . CARDIOVERSION N/A 03/06/2014   Procedure: CARDIOVERSION;  Surgeon: Larey Dresser, MD;  Location: [facility];  Service: Cardiovascular;  Laterality: N/A;  . CAROTID ENDARTERECTOMY Bilateral "several times"   "5 on right; 2-3 on left" (09/09/2015)  . CORONARY ARTERY BYPASS GRAFT  1997;  2002  . FRACTURE SURGERY    . LUMBAR LAMINECTOMY  July 2001  . LUNG REMOVAL, PARTIAL    . SHOULDER SURGERY Bilateral   . TEE WITHOUT CARDIOVERSION N/A 03/06/2014   Procedure: TRANSESOPHAGEAL ECHOCARDIOGRAM (TEE);  Surgeon: Larey Dresser, MD;  Location: [facility];  Service: Cardiovascular;  Laterality: N/A;  . THORACOTOMY Left    due to fungal infection        Home Medications    Prior to Admission medications   Medication Sig Start Date End Date Taking? Authorizing Provider  acetaminophen (TYLENOL) 325 MG tablet Take 2 tablets (650 mg total) by mouth every 4 (four) hours as needed for headache or mild pain. 09/29/15   Clegg, Amy D, NP  atorvastatin (LIPITOR) 80 MG tablet TAKE ONE TABLET BY MOUTH ONCE DAILY 02/26/15   Nahser, Wonda Cheng, MD  calcium acetate (PHOSLO) 667 MG capsule Take 1,334 mg by mouth 3 (three) times daily with meals.    [provider]  cholecalciferol (VITAMIN D) 1000 UNITS tablet Take 1,000 Units by mouth daily at 6 PM. D3    [provider]  clindamycin (CLEOCIN) 300 MG capsule Take 1 capsule (300 mg total) by mouth 3 (three) times daily. 10/02/17   Trula Slade, DPM  divalproex (DEPAKOTE SPRINKLE) 125 MG capsule Take 125 mg by mouth daily.  11/30/16   [provider]  insulin aspart (NOVOLOG FLEXPEN) 100 UNIT/ML FlexPen Inject 8 Units into the skin 2 (two) times daily before a meal.    [provider]  Insulin Glargine (LANTUS SOLOSTAR) 100 UNIT/ML SOPN Inject 24 Units into the skin every morning.     [provider]  levothyroxine (SYNTHROID, LEVOTHROID) 175  MCG tablet Take 175 mcg by mouth daily before breakfast.    [provider]  memantine (NAMENDA) 5 MG tablet Take 5 mg by mouth daily.  03/12/15   [provider]  Multiple Vitamins-Minerals (DECUBI-VITE) CAPS Take 1 capsule by mouth daily.    [provider]  multivitamin (RENA-VIT) TABS tablet Take 1 tablet by mouth daily.    [provider]  mupirocin ointment (BACTROBAN) 2 % Place 1 application into the nose 2 (two) times daily. 01/24/17   Roney Jaffe, MD  nitroGLYCERIN (NITROSTAT) 0.4 MG SL tablet Place 1 tablet (0.4 mg total) under the tongue every 5 (five) minutes as needed for chest pain. 07/12/17   Nahser, Wonda Cheng, MD  polyethylene glycol [facility] / Floria Raveling) packet Take 17 g by mouth daily as needed for mild constipation.    [provider]  ranitidine (ZANTAC) 300 MG tablet Take 300 mg by mouth daily. 12/04/15   [provider]  silver sulfADIAZINE (  SILVADENE) 1 % cream Apply 1 application topically daily. 02/13/17   Gean Birchwood, DPM  warfarin (COUMADIN) 5 MG tablet Take 5-7.5 mg by mouth See admin instructions. Take 1 1/2 (7.5mg ) tablet on M, W, F, and 1 tablet (5mg ) on Sun, T, Th, Sat    [provider]  warfarin (COUMADIN) 5 MG tablet USE AS DIRECTED 06/14/17   Nahser, Wonda Cheng, MD    Family History Family History  Problem Relation Age of Onset  . Heart attack Father   . Heart disease Father        After 37 yrs of age  . Hyperlipidemia Father   . Hypertension Father   . Diabetes Mother   . Hypertension Mother   . Heart disease Mother   . Heart disease Brother   . Diabetes Brother     Social History Social History   Tobacco Use  . Smoking status: Never Smoker  . Smokeless tobacco: Never Used  Substance Use Topics  . Alcohol use: No    Alcohol/week: 0.0 oz  . Drug use: No     Allergies   Betadine [povidone iodine]; Iodinated diagnostic agents; Latex; and Tape   Review of Systems Review of  Systems  Constitutional: Positive for appetite change and fatigue.  Respiratory: Positive for cough and shortness of breath.   Cardiovascular: Positive for chest pain.  Neurological: Positive for weakness.  All other systems reviewed and are negative.    Physical Exam Updated Vital Signs BP (!) 128/58   Pulse 69   Resp (!) 25   Ht 1.727 m (5\' 8" )   Wt 77.1 kg (170 lb)   SpO2 100%   BMI 25.85 kg/m   Physical Exam CONSTITUTIONAL: Elderly and chronically ill-appearing, patient moaning throughout exam HEAD: Normocephalic/atraumatic EYES: EOMI/PERRL ENMT: Mucous membranes moist uvula midline, no oral pharyngeal erythema NECK: supple no meningeal signs, no anterior neck edema or tenderness SPINE/BACK:entire spine nontender CV: S1/S2 noted, no loud murmurs LUNGS: Mild tachypnea noted, coarse breath sounds bilaterally ABDOMEN: soft, nontender, no rebound or guarding, bowel sounds noted throughout abdomen GU:no cva tenderness NEURO: Pt is awake/alert, patient appears mildly confused, moves all extremitiesx4.  No facial droop.  No arm or leg drift.  He follows all commands EXTREMITIES: pulses normal/equal, full ROM SKIN: warm, color normal   ED Treatments / Results  Labs (all labs ordered are listed, but only abnormal results are displayed) Labs Reviewed  BASIC METABOLIC PANEL - Abnormal; Notable for the following components:      Result Value   Chloride 94 (*)    CO2 33 (*)    Glucose, Bld 131 (*)    Creatinine, Ser 6.45 (*)    GFR calc non Af Amer 7 (*)    GFR calc Af Amer 8 (*)    All other components within normal limits  CBC - Abnormal; Notable for the following components:   RBC 4.06 (*)    Hemoglobin 12.9 (*)    MCV 102.7 (*)    Platelets 129 (*)    All other components within normal limits  PROTIME-INR - Abnormal; Notable for the following components:   Prothrombin Time 47.1 (*)    INR 5.14 (*)    All other components within normal limits  VALPROIC ACID LEVEL  - Abnormal; Notable for the following components:   Valproic Acid Lvl 14 (*)    All other components within normal limits  I-STAT TROPONIN, ED - Abnormal; Notable for the following components:  Troponin i, poc 0.30 (*)    All other components within normal limits    EKG EKG Interpretation  Date/Time:  Wednesday December 26 2017 01:48:18 EDT Ventricular Rate:  72 PR Interval:    QRS Duration: 170 QT Interval:  457 QTC Calculation: 501 R Axis:   75 Text Interpretation:  Sinus rhythm Left bundle branch block Abnormal ekg No significant change since last tracing Confirmed by Ripley Fraise [phone]) on 12/26/2017 2:06:37 AM   EKG Interpretation  Date/Time:  Wednesday December 26 2017 03:44:53 EDT Ventricular Rate:  65 PR Interval:    QRS Duration: 179 QT Interval:  490 QTC Calculation: 510 R Axis:   59 Text Interpretation:  Sinus rhythm Atrial premature complex Left bundle branch block Confirmed by Ripley Fraise [phone]) on 12/26/2017 3:47:43 AM       Radiology Dg Chest 2 View  Result Date: 12/26/2017 CLINICAL DATA:  Shortness of breath EXAM: CHEST - 2 VIEW COMPARISON:  01/22/2017 FINDINGS: Post sternotomy changes. Cardiomegaly with mild vascular congestion. There are small pleural effusions. Hazy edema or atelectasis at the bases. Aortic atherosclerosis. No pneumothorax. IMPRESSION: 1. Cardiomegaly with minimal vascular congestion and hazy edema or atelectasis at the bases. Small pleural effusions. Electronically Signed   By: Donavan Foil M.D.   On: 12/26/2017 02:30    Procedures Procedures   Medications Ordered in ED Medications  albuterol (PROVENTIL) (2.5 MG/3ML) 0.083% nebulizer solution 5 mg (5 mg Nebulization Given 12/26/17 0239)     Initial Impression / Assessment and Plan / ED Course  I have reviewed the triage vital signs and the nursing notes.  Pertinent labs & imaging results that were available during my care of the patient were reviewed by me and considered in my  medical decision making (see chart for details).     3:43 AM This is a very challenging history, patient presents with multiple complaints. Per family, patient has been having increasing generalized weakness for several weeks.  He also had difficulty swallowing/difficulty eating.  I see no signs of acute stroke.  Tonight apparently he woke his wife up coughing and reporting shortness of breath and pain throughout his body  when asked patient where he hurts he says everywhere. Reviewed EKG, there is no acute changes.  His troponin is mildly elevated this could be due to renal failure.  He has had evidence of some edema on his chest x-ray reports shortness of breath.  Worsening symptoms, we will admit to the hospital.  He is also noted to be supratherapeutic on his, but no signs of acute bleed, no focal abdominal tenderness. Discussed with Dr. Myna Hidalgo for admission Final Clinical Impressions(s) / ED Diagnoses   Final diagnoses:  ESRD (end stage renal disease) ([place])  Acute pulmonary edema ([place])  Supratherapeutic INR    ED Discharge Orders    None       Ripley Fraise, MD 12/26/17 [phone]

## 2017-12-26 NOTE — ED Notes (Signed)
Patient transported to X-ray 

## 2017-12-27 DIAGNOSIS — I272 Pulmonary hypertension, unspecified: Secondary | ICD-10-CM

## 2017-12-27 DIAGNOSIS — I350 Nonrheumatic aortic (valve) stenosis: Secondary | ICD-10-CM

## 2017-12-27 DIAGNOSIS — I5021 Acute systolic (congestive) heart failure: Secondary | ICD-10-CM

## 2017-12-27 LAB — TROPONIN I
TROPONIN I: 0.43 ng/mL — AB (ref <0.03)
TROPONIN I: 0.37 ng/mL — AB (ref <0.03)
Troponin I: 0.31 ng/mL (ref <0.03)

## 2017-12-27 LAB — BASIC METABOLIC PANEL
ANION GAP: 13 (ref 5–15)
BUN: 13 mg/dL (ref 8–23)
CO2: 31 mmol/L (ref 22–32)
Calcium: 8.8 mg/dL — ABNORMAL LOW (ref 8.9–10.3)
Chloride: 97 mmol/L — ABNORMAL LOW (ref 98–111)
Creatinine, Ser: 5.08 mg/dL — ABNORMAL HIGH (ref 0.61–1.24)
GFR calc Af Amer: 11 mL/min — ABNORMAL LOW (ref >60)
GFR calc non Af Amer: 10 mL/min — ABNORMAL LOW (ref >60)
GLUCOSE: 86 mg/dL (ref 70–99)
POTASSIUM: 3.9 mmol/L (ref 3.5–5.1)
Sodium: 141 mmol/L (ref 135–145)

## 2017-12-27 LAB — GLUCOSE, CAPILLARY
GLUCOSE-CAPILLARY: 139 mg/dL — AB (ref 70–99)
Glucose-Capillary: 72 mg/dL (ref 70–99)
Glucose-Capillary: 95 mg/dL (ref 70–99)

## 2017-12-27 LAB — PROTIME-INR
INR: 4.67
Prothrombin Time: 43.7 seconds — ABNORMAL HIGH (ref 11.4–15.2)

## 2017-12-27 LAB — CBC
HCT: 41.8 % (ref 39.0–52.0)
HEMOGLOBIN: 13.1 g/dL (ref 13.0–17.0)
MCH: 32.1 pg (ref 26.0–34.0)
MCHC: 31.3 g/dL (ref 30.0–36.0)
MCV: 102.5 fL — ABNORMAL HIGH (ref 78.0–100.0)
Platelets: 104 10*3/uL — ABNORMAL LOW (ref 150–400)
RBC: 4.08 MIL/uL — AB (ref 4.22–5.81)
RDW: 15.2 % (ref 11.5–15.5)
WBC: 4.9 10*3/uL (ref 4.0–10.5)

## 2017-12-27 LAB — MAGNESIUM: Magnesium: 2.2 mg/dL (ref 1.7–2.4)

## 2017-12-27 MED ORDER — METOPROLOL SUCCINATE ER 25 MG PO TB24
12.5000 mg | ORAL_TABLET | Freq: Every day | ORAL | Status: DC
  Administered 2017-12-27 – 2018-01-01 (×6): 12.5 mg via ORAL
  Filled 2017-12-27 (×6): qty 1

## 2017-12-27 MED ORDER — DOXYCYCLINE HYCLATE 100 MG PO TABS
100.0000 mg | ORAL_TABLET | Freq: Two times a day (BID) | ORAL | Status: AC
  Administered 2017-12-27 – 2017-12-29 (×6): 100 mg via ORAL
  Filled 2017-12-27 (×6): qty 1

## 2017-12-27 MED ORDER — DOXYCYCLINE HYCLATE 100 MG PO TABS: 100.0000 mg | ORAL_TABLET | Freq: Two times a day (BID) | ORAL | Status: DC

## 2017-12-27 MED ORDER — ASPIRIN EC 81 MG PO TBEC
81.0000 mg | DELAYED_RELEASE_TABLET | Freq: Every day | ORAL | Status: DC
  Administered 2017-12-27 – 2018-01-01 (×6): 81 mg via ORAL
  Filled 2017-12-27 (×6): qty 1

## 2017-12-27 NOTE — Consult Note (Signed)
Cardiology Consultation:   Patient ID: [name]; [id number]; 28-Mar-1934   Admit date: 12/26/2017 Date of Consult: 12/27/2017  Primary Care Provider: Leanna Battles, MD Primary Cardiologist: Mertie Moores, MD     Patient Profile:   [name] is a 84 y.o. male with a hx of severe AS, CAD with prior CABG, ESRD who is being seen today for the evaluation of SOB at the request of Dr [name].  History of Present Illness:   [name] 84 yo male history of CAD with prior CABG in 1997, ESRD,  Severe aortic stenosis deemed poor candidate for interverntions, chronic diastolic HF, afib, HTN, dementia, DNR status. Presents with SOB. Difficult historian due to his dementia. Combined history from him and and his wife. Appears to have chronic SOB that has progressed over the last 2 weeks. Intermittent chest pain, very difficult to discern specifics about. Symptoms much improved on admission after dialysis. Family reports overall compliance with HD though some occasional missed sessions.    K 4.2 Cr 6.45 WBC 5.2 Hgb 12.9 Plt 129 INR 5 Trop 0.3--> 0.35-->0.36-->0.47--> CXR cardiomegaly minimal vascular congestion, hazy edema EKG SR, LBBB 12/2017 echo LVEF 20-25%, diffuse hypokinesis with focal WMAs, grade II diastolic dysfunction, severe AS AVA VTI 0.83, mean grad 30 in setting of low LVEF (mean grad 57 when LVEF was normal), dimensionless index 0.2, mod MR, severe RV dysfunction, PASP 60 2017 cath: LCX occluded, RCA 99%, LAD occluded. LIMA-LAD patent, SVG-diag occluded, SVG-OM occlued, patent SVG-RCA     Past Medical History:  Diagnosis Date  . Atrial fibrillation ([place]) Aug. 2015   a. Dx 01/2014, s/p DCCV 03/06/14.  Marland Kitchen CAD (coronary artery disease)    a. prior CABG in 1997 with redo in 2000. b. last cath in 2008 - managed medically  . Carotid disease, bilateral ([place])    with multiple bilateral carotid surgeries  . Chronic diastolic CHF (congestive heart failure) ([place])   . Chronic  respiratory failure with hypoxia ([place])   . CKD (chronic kidney disease), stage IV ([place])    a. Has fistula in place.   . Degenerative joint disease   . Dementia   . Generalized weakness    without overt findings  . GERD (gastroesophageal reflux disease)   . Gout   . Heart murmur   . Hyperlipidemia   . Hypertension   . Hypothyroidism   . Meniere's disease   . On home oxygen therapy    "2L" qhs (09/09/2015  . Orthopnea    Two-pillow  . Peripheral vascular disease ([place])   . Psoriasis   . Shingles   . Sinus bradycardia    a. Toprol D/C'd due to this.   . Type 2 diabetes mellitus ([place])     Past Surgical History:  Procedure Laterality Date  . APPENDECTOMY    . AV FISTULA PLACEMENT Left 01/21/2013   Procedure: ARTERIOVENOUS (AV) FISTULA CREATION, Left Brachiocephalic;  Surgeon: Angelia Mould, MD;  Location: [facility];  Service: Vascular;  Laterality: Left;  . BLEPHAROPLASTY Bilateral   . CARDIAC CATHETERIZATION    . CARDIAC CATHETERIZATION  2008   L main irreg, LAD 80%, IMA-LAD & SVG-Diag patent, CFX 100%, SVG-OM 90%, RCA 70%, SVG-RCA OK, EF nl, med rx, no vessels appropriate for PCI  . CARDIAC CATHETERIZATION N/A 01/12/2016   Procedure: Left Heart Cath and Cors/Grafts Angiography;  Surgeon: Belva Crome, MD;  Location: [facility];  Service: Cardiovascular;  Laterality: N/A;  . CARDIOVERSION N/A 03/06/2014  Procedure: CARDIOVERSION;  Surgeon: Larey Dresser, MD;  Location: [facility];  Service: Cardiovascular;  Laterality: N/A;  . CAROTID ENDARTERECTOMY Bilateral "several times"   "5 on right; 2-3 on left" (09/09/2015)  . CORONARY ARTERY BYPASS GRAFT  1997;  2002  . FRACTURE SURGERY    . LUMBAR LAMINECTOMY  July 2001  . LUNG REMOVAL, PARTIAL    . SHOULDER SURGERY Bilateral   . TEE WITHOUT CARDIOVERSION N/A 03/06/2014   Procedure: TRANSESOPHAGEAL ECHOCARDIOGRAM (TEE);  Surgeon: Larey Dresser, MD;  Location: [facility];  Service: Cardiovascular;  Laterality: N/A;    . THORACOTOMY Left    due to fungal infection      Inpatient Medications: Scheduled Meds: . atorvastatin  80 mg Oral q1800  . calcium acetate  1,334 mg Oral BID WC  . Chlorhexidine Gluconate Cloth  6 each Topical Q0600  . cholecalciferol  1,000 Units Oral Daily  . divalproex  125 mg Oral Daily  . famotidine  20 mg Oral Daily  . insulin aspart  0-5 Units Subcutaneous QHS  . insulin aspart  0-9 Units Subcutaneous TID WC  . insulin glargine  12 Units Subcutaneous Daily  . levothyroxine  175 mcg Oral QAC breakfast  . mouth rinse  15 mL Mouth Rinse BID  . memantine  5 mg Oral Daily  . multivitamin  1 tablet Oral Daily  . sodium chloride flush  3 mL Intravenous Q12H  . sodium chloride flush  3 mL Intravenous Q12H   Continuous Infusions: . sodium chloride     PRN Meds: sodium chloride, acetaminophen **OR** acetaminophen, HYDROcodone-acetaminophen, morphine injection, nitroGLYCERIN, ondansetron **OR** ondansetron (ZOFRAN) IV, polyethylene glycol, senna-docusate, sodium chloride flush  Allergies:    Allergies  Allergen Reactions  . Betadine [Povidone Iodine] Rash  . Iodinated Diagnostic Agents Rash and Other (See Comments)    Does fine with premedications for cath  . Latex Hives  . Tape Rash    MUST BE FREE OF ANY LATEX    Social History:   Social History   Socioeconomic History  . Marital status: Married    Spouse name: Not on file  . Number of children: Not on file  . Years of education: Not on file  . Highest education level: Not on file  Occupational History  . Occupation: Retired  Scientific laboratory technician  . Financial resource strain: Not on file  . Food insecurity:    Worry: Not on file    Inability: Not on file  . Transportation needs:    Medical: Not on file    Non-medical: Not on file  Tobacco Use  . Smoking status: Never Smoker  . Smokeless tobacco: Never Used  Substance and Sexual Activity  . Alcohol use: No    Alcohol/week: 0.0 oz  . Drug use: No  . Sexual  activity: Not Currently  Lifestyle  . Physical activity:    Days per week: Not on file    Minutes per session: Not on file  . Stress: Not on file  Relationships  . Social connections:    Talks on phone: Not on file    Gets together: Not on file    Attends religious service: Not on file    Active member of club or organization: Not on file    Attends meetings of clubs or organizations: Not on file    Relationship status: Not on file  . Intimate partner violence:    Fear of current or ex partner: Not on file  Emotionally abused: Not on file    Physically abused: Not on file    Forced sexual activity: Not on file  Other Topics Concern  . Not on file  Social History Narrative  . Not on file    Family History:    Family History  Problem Relation Age of Onset  . Heart attack Father   . Heart disease Father        After 23 yrs of age  . Hyperlipidemia Father   . Hypertension Father   . Diabetes Mother   . Hypertension Mother   . Heart disease Mother   . Heart disease Brother   . Diabetes Brother      ROS:  Please see the history of present illness.  All other ROS reviewed and negative.     Physical Exam/Data:   Vitals:   12/26/17 2323 12/27/17 0321 12/27/17 0500 12/27/17 0751  BP: (!) 130/57 (!) 127/55  (!) 121/57  Pulse: 67 67  60  Resp: 17 20  12   Temp: 97.6 F (36.4 C) 97.7 F (36.5 C)  97.8 F (36.6 C)  TempSrc: Oral Oral  Oral  SpO2: 100% 100%  100%  Weight:   155 lb 1.6 oz (70.4 kg)   Height:        Intake/Output Summary (Last 24 hours) at 12/27/2017 0916 Last data filed at 12/27/2017 0700 Gross per 24 hour  Intake 0 ml  Output 2427 ml  Net -2427 ml   Filed Weights   12/26/17 1250 12/26/17 1655 12/27/17 0500  Weight: 162 lb 4.1 oz (73.6 kg) 155 lb 10.3 oz (70.6 kg) 155 lb 1.6 oz (70.4 kg)   Body mass index is 23.58 kg/m.  General:  Well nourished, well developed, in no acute distress HEENT: normal Lymph: no adenopathy Neck: no JVD Endocrine:   No thryomegaly  Cardiac:  normal S1, S2; 3/6 systolic murmur rusb Lungs:  clear to auscultation bilaterally, no wheezing, rhonchi or rales  Abd: soft, nontender, no hepatomegaly  Ext: no edema Musculoskeletal:  No deformities, BUE and BLE strength normal and equal Skin: warm and dry  Neuro:  CNs 2-12 intact, no focal abnormalities noted Psych:  Normal affect      Laboratory Data:  Chemistry Recent Labs  Lab 12/26/17 0145 12/26/17 1258  NA 139 137  K 4.2 4.2  CL 94* 96*  CO2 33* 28  GLUCOSE 131* 132*  BUN 21 25*  CREATININE 6.45* 7.04*  CALCIUM 9.5 9.3  GFRNONAA 7* 6*  GFRAA 8* 7*  ANIONGAP 12 13    Recent Labs  Lab 12/26/17 1258  ALBUMIN 3.5   Hematology Recent Labs  Lab 12/26/17 0145 12/26/17 2102 12/27/17 0816  WBC 5.2 5.6 4.9  RBC 4.06* 4.05* 4.08*  HGB 12.9* 12.9* 13.1  HCT 41.7 41.6 41.8  MCV 102.7* 102.7* 102.5*  MCH 31.8 31.9 32.1  MCHC 30.9 31.0 31.3  RDW 14.9 14.9 15.2  PLT 129* 115* 104*   Cardiac Enzymes Recent Labs  Lab 12/26/17 0503 12/26/17 1024 12/26/17 2102  TROPONINI 0.35* 0.36* 0.47*    Recent Labs  Lab 12/26/17 0159  TROPIPOC 0.30*    BNPNo results for input(s): BNP, PROBNP in the last 168 hours.  DDimer No results for input(s): DDIMER in the last 168 hours.  Radiology/Studies:  Dg Chest 2 View  Result Date: 12/26/2017 CLINICAL DATA:  Shortness of breath EXAM: CHEST - 2 VIEW COMPARISON:  01/22/2017 FINDINGS: Post sternotomy changes. Cardiomegaly with mild vascular congestion. There  are small pleural effusions. Hazy edema or atelectasis at the bases. Aortic atherosclerosis. No pneumothorax. IMPRESSION: 1. Cardiomegaly with minimal vascular congestion and hazy edema or atelectasis at the bases. Small pleural effusions. Electronically Signed   By: Donavan Foil M.D.   On: 12/26/2017 02:30    Assessment and Plan:   1. Acute biventricular systolic heart failure - LVEF dropped to 20-25% in patient with known CAD, severe AS -  fluid management per HD. Symptoms have improved with HD here - can try low dose Toprol 12.5mg , follow vitals during nonHD and HD days - poor candidate for cath due to advancing dementia  2. Severe aortic stenosis - notes from primary cardiologist reviewed. Thought to be poor candidate for either surgical AVR or TAVR based on dementia, advanced comorbidities.  - I agree with Dr Acie Fredrickson patient is poor candidate any form of AVR. I discussed with family and patient. They are going to think over, if requested by family  can consider a formal structural heart team evaluation but I think of low yield  3. CAD/NSTEMI - elevated trop in setting of ESRD, CHF. Acute drop in LVEF. EKG LBBB, wider than prior EKG.  - medical therapy with atorvastatin 80. STart ASA 81mg  due to elevated trop -supratherapeutic INR, not on hep gtt - poor cath candidate   Overall patient with severe systolic dysfunction in setting of severe AS, possibly progressing CAD. Severe dementia. Requires assistance with most of his ADLs at home. Severe mobility limitations, can use walker at home at times, over last few weeks mainly using wheelchair. Would recommend palliative consult. Poor candidate for valve intervention, untreated severe AS with CHF very poor prognosis.      For questions or updates, please contact [place] Please consult [email or web address] for contact info under Cardiology/STEMI.   Merrily Pew, MD  12/27/2017 9:16 AM

## 2017-12-27 NOTE — Progress Notes (Addendum)
At 1710 patient was sitting on bsc (wife in room). Nt Manuela Schwartz assisted patient back to bed ( he had been up 2 times to bsc today) and patient became weak and started to drop to the floor. NT Manuela Schwartz assisted patient to floor on his bottom. Pt scraped his left hand as he was trying to grab the bed and his arm bands on his left arm cut his forearm. Cleansed and placed foam dressings. . Pt's wife inroom and aware. Assisted back to bed without incident. Dr. Sherral Hammers aware.Post fall huddle done.  Pt had non slip yellow socks on , pt was not unsupervised  At the time of fall. [name]

## 2017-12-27 NOTE — Progress Notes (Signed)
PROGRESS NOTE    [name]  WJX:[id number] DOB: Nov 13, 1933 DOA: 12/26/2017 PCP: Leanna Battles, MD   Brief Narrative:  84 y.o. WM  PMHx, dementia, Diabetes type 2 uncontrolled with complication, Paroxysmal A. fib on warfarin, HTN,CAD status post CABG, Chronic Respiratory Failure with Hypoxia, hypothyroidism, ESRD on HD   Presenting to the emergency department for evaluation of acute shortness of breath.  Patient is accompanied by his wife who assists with the history.  He has reportedly been complaining of shortness of breath and chest pain intermittently for the past 2 months, but woke overnight with acute shortness of breath and cough.  He reports pain "all over."  He also acknowledges ongoing chest pain.  He is not very active at all and unable to identify any alleviating or exacerbating factors.  Denies any missed or incomplete dialysis sessions recently.  Denies dietary indiscretion. Reports recent UTI diagnosis at dialysis, now status-post treatment.    ED Course: Upon arrival to the ED, patient is found to be saturating 100% on 2 L/min supplemental oxygen, slightly tachypneic, and with vitals otherwise stable.  EKG features a sinus rhythm with LBBB.  Chest x-ray is notable for cardiomegaly with minimal vascular congestion and hazy edema or atelectasis at the bases, as well as small bilateral pleural effusions.  Chemistry panel is notable for a bicarbonate of 33.  CBC features an improved chronic thrombocytopenia with platelets now 129,000.  INR is supratherapeutic at 5.14 and troponin is elevated to 0.30.  Patient was treated with albuterol neb in the ED.  He remains hemodynamically stable and continues to complain of shortness of breath and pain "all over" including his chest.  He will be admitted for ongoing evaluation and management.    Subjective: 7/4 A/O x4, negative CP, negative acute S OB, positive chronic S OB.  Negative abdominal pain.  Wife states that on Monday was started on  doxycycline for UTI but did not complete course secondary to being admitted to hospital.      Assessment & Plan:   Principal Problem:   Acute on chronic diastolic heart failure (HCC) Active Problems:   Diabetes mellitus with peripheral vascular disease ([facility])   CAD (coronary artery disease)   Atrial fibrillation ([facility])   Hypothyroidism   ESRD on dialysis ([place])   Chest pain   Supratherapeutic INR   Elevated troponin     Acute on Chronic Diastolic CHF -Multifactorial NSTEMI, fluid overload -Strict in and out since admission -2.4 L -Daily weight Filed Weights   12/26/17 1250 12/26/17 1655 12/27/17 0500  Weight: 162 lb 4.1 oz (73.6 kg) 155 lb 10.3 oz (70.6 kg) 155 lb 1.6 oz (70.4 kg)  - Transfuse for hemoglobin<8 -Appears to be subacute process as wife states has been worsening over the past couple months: Increasing S OB.  Patient refusing to come to hospital. - Has only missed a couple of HD sessions per wife in past 2 to 3 weeks - Make some urine was treated with dose of IV Lasix -Echocardiogram; consistent with acute on chronic diastolic CHF, acute systolic CHF, pulmonary HTN see results below  Acute systolic CHF - Compared to previous echocardiogram 01/24/2017 significant decrease in patient's cardiac function.  See echocardiogram.   -Discussed case with Dr. Donnella Bi cardiology.  Has agreed to see patient.    NSTEMI -Trend troponin Recent Labs  Lab 12/26/17 0503 12/26/17 1024 12/26/17 2102  TROPONINI 0.35* 0.36* 0.47*  -See CHF  Pulmonary HTN -See CHF  ESRD HD M/W/F? -  Reports completing HD on 7/1  -Per patient is a patient of  Dr. [facility]. -Discussed case with Dr. Elmarie Shiley nephrology -Further HD per nephrology  Paroxysmal atrial fibrillation (CHADSVASc5+)/supratherapeutic INR -Currently NSR - Warfarin 5mg  Sun/Tue/Thur/Sat; 7.5 M/W/F (Hold) until [employer] 12/26/17 0223 12/27/17 0259  INR 5.14* 4.67*  -Currently negative  signs of active bleeding.   Dementia -Continue Namenda and Depakote     Diabetes type 2 controlled with complication -7/3 hemoglobin A1c= 5.8 -Lantus 12 units  - sensitive SSI   HLD -Lipid panel within ADA guidelines -Continue Lipitor 80 mg daily  Hypothyroidism - Synthroid 175 mcg daily  UTI? -Unable to see lab results for urinalysis.  Per wife urine microbiology sent to [place]? - Doxycycline 100 mg BID 3-day     DVT prophylaxis: Warfarin (hold) Code Status: DNR Family Communication: Wife at bedside for discussion plan of care Disposition Plan: TBD   Consultants:  Nephrology     Procedures/Significant Events:  7/3 echocardiogram:Left ventricle: LVEF = 20%  to 25%. Diffuse hypokinesis and akinesis of the basal and mid  inderoseptal, inferior and inferolateral walls and apical septal   and inferior walls.  -Grade 2 diastolic dysfunction. - Aortic valve: severe  stenosis. Moderate regurgitation.  - Mitral valve: Moderate  regurgitation. - Left atrium:Moderately dilated. - Right ventricle: Moderate regurgitation. - Pulmonary arteries:  PA peak pressure: 60 mm Hg (S).      I have personally reviewed and interpreted all radiology studies and my findings are as above.  VENTILATOR SETTINGS:    Cultures   Antimicrobials:    Devices    LINES / TUBES:      Continuous Infusions: . sodium chloride       Objective: Vitals:   12/26/17 2323 12/27/17 0321 12/27/17 0500 12/27/17 0751  BP: (!) 130/57 (!) 127/55  (!) 121/57  Pulse: 67 67  60  Resp: 17 20  12   Temp: 97.6 F (36.4 C) 97.7 F (36.5 C)  97.8 F (36.6 C)  TempSrc: Oral Oral  Oral  SpO2: 100% 100%  100%  Weight:   155 lb 1.6 oz (70.4 kg)   Height:        Intake/Output Summary (Last 24 hours) at 12/27/2017 0759 Last data filed at 12/27/2017 0700 Gross per 24 hour  Intake 0 ml  Output 2427 ml  Net -2427 ml   Filed Weights   12/26/17 1250 12/26/17 1655 12/27/17 0500  Weight: 162  lb 4.1 oz (73.6 kg) 155 lb 10.3 oz (70.6 kg) 155 lb 1.6 oz (70.4 kg)    Physical Exam:  General: A/O x4, follows all commands No acute respiratory distress, cachectic Neck:  Negative scars, masses, torticollis, lymphadenopathy, JVD Lungs: Clear to auscultation bilaterally without wheezes or crackles Cardiovascular: Regular rate and rhythm without murmur gallop or rub normal S1 and S2 Abdomen: negative abdominal pain, nondistended, positive soft, bowel sounds, no rebound, no ascites, no appreciable mass Extremities: No significant cyanosis, clubbing, or edema bilateral lower extremities Skin: Negative rashes, lesions, ulcers Psychiatric:  Negative depression, negative anxiety, negative fatigue, negative mania  Central nervous system:  Cranial nerves II through XII intact, tongue/uvula midline, all extremities muscle strength 5/5, sensation intact throughout,  negative dysarthria, negative expressive aphasia, negative receptive aphasia. .     Data Reviewed: Care during the described time interval was provided by me .  I have reviewed this patient's available data, including medical history, events of note, physical examination, and all test  results as part of my evaluation.   CBC: Recent Labs  Lab 12/26/17 0145 12/26/17 2102  WBC 5.2 5.6  HGB 12.9* 12.9*  HCT 41.7 41.6  MCV 102.7* 102.7*  PLT 129* 235*   Basic Metabolic Panel: Recent Labs  Lab 12/26/17 0145 12/26/17 1258  NA 139 137  K 4.2 4.2  CL 94* 96*  CO2 33* 28  GLUCOSE 131* 132*  BUN 21 25*  CREATININE 6.45* 7.04*  CALCIUM 9.5 9.3  PHOS  --  3.3   GFR: Estimated Creatinine Clearance: 8 mL/min (A) (by C-G formula based on SCr of 7.04 mg/dL (H)). Liver Function Tests: Recent Labs  Lab 12/26/17 1258  ALBUMIN 3.5   No results for input(s): LIPASE, AMYLASE in the last 168 hours. No results for input(s): AMMONIA in the last 168 hours. Coagulation Profile: Recent Labs  Lab 12/26/17 0223 12/27/17 0259  INR  5.14* 4.67*   Cardiac Enzymes: Recent Labs  Lab 12/26/17 0503 12/26/17 1024 12/26/17 2102  TROPONINI 0.35* 0.36* 0.47*   BNP (last 3 results) No results for input(s): PROBNP in the last 8760 hours. HbA1C: Recent Labs    12/26/17 1024  HGBA1C 5.8*   CBG: Recent Labs  Lab 12/26/17 0812 12/26/17 1800 12/26/17 2128 12/27/17 0719  GLUCAP 90 86 145* 72   Lipid Profile: Recent Labs    12/26/17 1024  CHOL 101  HDL 41  LDLCALC 41  TRIG 96  CHOLHDL 2.5   Thyroid Function Tests: No results for input(s): TSH, T4TOTAL, FREET4, T3FREE, THYROIDAB in the last 72 hours. Anemia Panel: No results for input(s): VITAMINB12, FOLATE, FERRITIN, TIBC, IRON, RETICCTPCT in the last 72 hours. Urine analysis:    Component Value Date/Time   COLORURINE YELLOW 09/17/2015 1815   APPEARANCEUR CLOUDY (A) 09/17/2015 1815   LABSPEC 1.010 09/17/2015 1815   PHURINE 5.0 09/17/2015 1815   GLUCOSEU NEGATIVE 09/17/2015 1815   HGBUR SMALL (A) 09/17/2015 1815   BILIRUBINUR NEGATIVE 09/17/2015 1815   KETONESUR NEGATIVE 09/17/2015 1815   PROTEINUR NEGATIVE 09/17/2015 1815   UROBILINOGEN 1.0 03/23/2014 2156   NITRITE NEGATIVE 09/17/2015 1815   LEUKOCYTESUR LARGE (A) 09/17/2015 1815   Sepsis Labs: @LABRCNTIP (procalcitonin:4,lacticidven:4)  ) Recent Results (from the past 240 hour(s))  MRSA PCR Screening     Status: None   Collection Time: 12/26/17  5:59 AM  Result Value Ref Range Status   MRSA by PCR NEGATIVE NEGATIVE Final    Comment:        The GeneXpert MRSA Assay (FDA approved for NASAL specimens only), is one component of a comprehensive MRSA colonization surveillance program. It is not intended to diagnose MRSA infection nor to guide or monitor treatment for MRSA infections. Performed at [facility], [place] [address], [place], [place] [address]          Radiology Studies: Dg Chest 2 View  Result Date: 12/26/2017 CLINICAL DATA:  Shortness of breath EXAM: CHEST - 2 VIEW  COMPARISON:  01/22/2017 FINDINGS: Post sternotomy changes. Cardiomegaly with mild vascular congestion. There are small pleural effusions. Hazy edema or atelectasis at the bases. Aortic atherosclerosis. No pneumothorax. IMPRESSION: 1. Cardiomegaly with minimal vascular congestion and hazy edema or atelectasis at the bases. Small pleural effusions. Electronically Signed   By: Donavan Foil M.D.   On: 12/26/2017 02:30        Scheduled Meds: . atorvastatin  80 mg Oral q1800  . calcium acetate  1,334 mg Oral BID WC  . Chlorhexidine Gluconate Cloth  6  each Topical [id number]  . cholecalciferol  1,000 Units Oral Daily  . divalproex  125 mg Oral Daily  . famotidine  20 mg Oral Daily  . insulin aspart  0-5 Units Subcutaneous QHS  . insulin aspart  0-9 Units Subcutaneous TID WC  . insulin glargine  12 Units Subcutaneous Daily  . levothyroxine  175 mcg Oral QAC breakfast  . mouth rinse  15 mL Mouth Rinse BID  . memantine  5 mg Oral Daily  . multivitamin  1 tablet Oral Daily  . sodium chloride flush  3 mL Intravenous Q12H  . sodium chloride flush  3 mL Intravenous Q12H   Continuous Infusions: . sodium chloride       LOS: 1 day    Time spent: 40 minutes    Sylwia Cuervo, Geraldo Docker, MD [facility] Pager [phone]   If 7PM-7AM, please contact night-coverage [email or web address] Password [email or web address] 12/27/2017, 7:59 AM

## 2017-12-27 NOTE — Progress Notes (Signed)
Patient ID: [name], male   DOB: 11/21/1933, 84 y.o.   MRN: [id number] [facility] Progress Note   Assessment/ Plan:   1.  Acute on chronic shortness of breath: Suspected to be from decreased lean body mass with reduced oral intake/dysphagia in the recent past-underwent dialysis with effort to try and lower dry weight with successful improvement of symptoms.  Needs evaluation of his dysphagia. 2.  End-stage renal disease: Underwent hemodialysis yesterday to challenge his dry weight-we will order for his next hemodialysis treatment tomorrow.  He does not have any acute indications for dialysis today. 3.  Hypertension:  Blood pressures currently appear to be well controlled, continue to monitor with ultrafiltration/dialysis. 4.  Anemia of chronic kidney disease: Hemoglobin/hematocrit currently at goal, not on ESA. 5.  Secondary hyperparathyroidism:  Calcium and phosphorus levels currently at goal on treatment with calcium acetate for phosphorus binding.  Not on VDRA 6.  Dementia: Continue memantine.  Subjective:   Reports improvement in shortness of breath overnight after hemodialysis yesterday.  Does not recall any cramping or adverse events during dialysis yesterday.   Objective:   BP (!) 127/55 (BP Location: Right Arm)   Pulse 67   Temp 97.7 F (36.5 C) (Oral)   Resp 20   Ht 5\' 8"  (1.727 m)   Wt 70.4 kg (155 lb 1.6 oz)   SpO2 100%   BMI 23.58 kg/m   Physical Exam: Gen: Comfortably resting in bed, on oxygen via nasal cannula CVS: Pulse irregularly irregular, normal rate, S1 and S2 normal Resp: Poor inspiratory effort with decreased breath sounds over bases, no rales/rhonchi Abd: Soft, flat, nontender Ext: No lower extremity edema, left upper arm aVF with good thrill  Labs: BMET Recent Labs  Lab 12/26/17 0145 12/26/17 1258  NA 139 137  K 4.2 4.2  CL 94* 96*  CO2 33* 28  GLUCOSE 131* 132*  BUN 21 25*  CREATININE 6.45* 7.04*  CALCIUM 9.5 9.3  PHOS  --   3.3   CBC Recent Labs  Lab 12/26/17 0145 12/26/17 2102  WBC 5.2 5.6  HGB 12.9* 12.9*  HCT 41.7 41.6  MCV 102.7* 102.7*  PLT 129* 115*   Medications:    . atorvastatin  80 mg Oral q1800  . calcium acetate  1,334 mg Oral BID WC  . Chlorhexidine Gluconate Cloth  6 each Topical Q0600  . cholecalciferol  1,000 Units Oral Daily  . divalproex  125 mg Oral Daily  . famotidine  20 mg Oral Daily  . insulin aspart  0-5 Units Subcutaneous QHS  . insulin aspart  0-9 Units Subcutaneous TID WC  . insulin glargine  12 Units Subcutaneous Daily  . levothyroxine  175 mcg Oral QAC breakfast  . mouth rinse  15 mL Mouth Rinse BID  . memantine  5 mg Oral Daily  . multivitamin  1 tablet Oral Daily  . sodium chloride flush  3 mL Intravenous Q12H  . sodium chloride flush  3 mL Intravenous Q12H   Elmarie Shiley, MD 12/27/2017, 7:31 AM

## 2017-12-28 DIAGNOSIS — I25118 Atherosclerotic heart disease of native coronary artery with other forms of angina pectoris: Secondary | ICD-10-CM

## 2017-12-28 DIAGNOSIS — R079 Chest pain, unspecified: Secondary | ICD-10-CM

## 2017-12-28 DIAGNOSIS — I5033 Acute on chronic diastolic (congestive) heart failure: Secondary | ICD-10-CM

## 2017-12-28 DIAGNOSIS — Z992 Dependence on renal dialysis: Secondary | ICD-10-CM

## 2017-12-28 DIAGNOSIS — R748 Abnormal levels of other serum enzymes: Secondary | ICD-10-CM

## 2017-12-28 DIAGNOSIS — N186 End stage renal disease: Secondary | ICD-10-CM

## 2017-12-28 LAB — GLUCOSE, CAPILLARY
GLUCOSE-CAPILLARY: 80 mg/dL (ref 70–99)
Glucose-Capillary: 201 mg/dL — ABNORMAL HIGH (ref 70–99)
Glucose-Capillary: 132 mg/dL — ABNORMAL HIGH (ref 70–99)
Glucose-Capillary: 113 mg/dL — ABNORMAL HIGH (ref 70–99)

## 2017-12-28 LAB — RENAL FUNCTION PANEL
ALBUMIN: 3.4 g/dL — AB (ref 3.5–5.0)
Anion gap: 10 (ref 5–15)
BUN: 24 mg/dL — AB (ref 8–23)
CO2: 30 mmol/L (ref 22–32)
CREATININE: 6.61 mg/dL — AB (ref 0.61–1.24)
Calcium: 8.9 mg/dL (ref 8.9–10.3)
Chloride: 97 mmol/L — ABNORMAL LOW (ref 98–111)
GFR calc Af Amer: 8 mL/min — ABNORMAL LOW (ref >60)
GFR, EST NON AFRICAN AMERICAN: 7 mL/min — AB (ref >60)
Glucose, Bld: 115 mg/dL — ABNORMAL HIGH (ref 70–99)
PHOSPHORUS: 3.4 mg/dL (ref 2.5–4.6)
Potassium: 3.9 mmol/L (ref 3.5–5.1)
Sodium: 137 mmol/L (ref 135–145)

## 2017-12-28 LAB — CBC
HCT: 39.9 % (ref 39.0–52.0)
Hemoglobin: 12.5 g/dL — ABNORMAL LOW (ref 13.0–17.0)
MCH: 31.7 pg (ref 26.0–34.0)
MCHC: 31.3 g/dL (ref 30.0–36.0)
MCV: 101.3 fL — ABNORMAL HIGH (ref 78.0–100.0)
PLATELETS: 127 10*3/uL — AB (ref 150–400)
RBC: 3.94 MIL/uL — ABNORMAL LOW (ref 4.22–5.81)
RDW: 14.9 % (ref 11.5–15.5)
WBC: 5.8 10*3/uL (ref 4.0–10.5)

## 2017-12-28 LAB — PROTIME-INR
INR: 4.68 — AB
Prothrombin Time: 43.8 seconds — ABNORMAL HIGH (ref 11.4–15.2)

## 2017-12-28 MED ORDER — LORAZEPAM 2 MG/ML IJ SOLN: 0.5000 mg | INTRAMUSCULAR | Status: DC | PRN

## 2017-12-28 MED ORDER — HEPARIN SODIUM (PORCINE) 1000 UNIT/ML DIALYSIS: 100.0000 [IU]/kg | INTRAMUSCULAR | Status: DC | PRN

## 2017-12-28 MED ORDER — MORPHINE SULFATE (PF) 4 MG/ML IV SOLN: 3.0000 mg | INTRAVENOUS | Status: DC | PRN

## 2017-12-28 NOTE — Progress Notes (Signed)
[place] TEAM 1 - Stepdown/ICU TEAM  [name]  VQM:[id number] DOB: 03-22-1934 DOA: 12/26/2017 PCP: Leanna Battles, MD    Brief Narrative:  84yo M w/ Hx dementia, DM2, Paroxysmal A. fib on warfarin, HTN, CAD status post CABG, Chronic Respiratory Failure with Hypoxia, hypothyroidism, and ESRD on HD who presented to the ED w/ shortness of breath.   Significant Events: 7/3 admit   Subjective: The patient was lethargic during my visit.  He interacted only to a minimum extent.  I had a very lengthy discussion with the patient's wife and her son at the bedside discussing a very serious nature of his active medical conditions.  Assessment & Plan:  Acute SOB Multifactorial:   AoS, CHF, ESRD - improved w/ HD   Acute biventricular systolic CHF Compared to TTE 01/24/2017 significant decrease in patient's cardiac function w/ EF 20-25% - not a candidate for further intervention/evaluation - medical management only  Severe AoS Cards feels pt is a poor candidate for AoVR or even TAVR - not a candidate for further intervention/evaluation - medical management only  CAD / NSTEMI Not a candidate for further intervention/evaluation - medical management only  ESRD HD M/W/F Dry weight being lowered by Nephrology as pt has lost lean body mass - initiated discussion with the patient's wife and son about determining whether or not we should continue hemodialysis - family is not yet ready to make a decision - patient himself reportedly has voiced on multiple occasions that he is tired of dialysis  Anemia of CKD  Paroxysmal atrial fibrillation (CHADSVASc5+) Warfarin 5mg  Sun/Tue/Thur/Sat; 7.5 M/W/F - rate controlled at this time  Dementia Continue Namenda and Depakote   DM2 7/3 A1c 5.8 - CBG currently well-controlled  HLD Continue Lipitor   Hypothyroidism Continue Synthroid   DVT prophylaxis: warfarin Code Status: FULL CODE Family Communication: no family present at time of exam    Disposition Plan: SDU  Consultants:  Cardiology  Nephrology   Antimicrobials:  Doxycycline 7/4 >  Objective: Blood pressure (!) 125/49, pulse 63, temperature 97.8 F (36.6 C), temperature source Oral, resp. rate (!) 21, height 5\' 8"  (1.727 m), weight 69.2 kg (152 lb 8.9 oz), SpO2 100 %.  Intake/Output Summary (Last 24 hours) at 12/28/2017 1354 Last data filed at 12/28/2017 1317 Gross per 24 hour  Intake 770 ml  Output 2278 ml  Net -1508 ml   Filed Weights   12/28/17 0300 12/28/17 0656 12/28/17 1106  Weight: 71.3 kg (157 lb 3 oz) 71.4 kg (157 lb 6.5 oz) 69.2 kg (152 lb 8.9 oz)    Examination: General: No acute respiratory distress Lungs: Clear to auscultation bilaterally without wheezes or crackles Cardiovascular: Regular rate and rhythm without murmur gallop or rub normal S1 and S2 Abdomen: Nontender, nondistended, soft, bowel sounds positive, no rebound, no ascites, no appreciable mass Extremities: No significant cyanosis, clubbing, or edema bilateral lower extremities  CBC: Recent Labs  Lab 12/26/17 2102 12/27/17 0816 12/28/17 0720  WBC 5.6 4.9 5.8  HGB 12.9* 13.1 12.5*  HCT 41.6 41.8 39.9  MCV 102.7* 102.5* 101.3*  PLT 115* 104* 932*   Basic Metabolic Panel: Recent Labs  Lab 12/26/17 1258 12/27/17 0816 12/28/17 0720  NA 137 141 137  K 4.2 3.9 3.9  CL 96* 97* 97*  CO2 28 31 30   GLUCOSE 132* 86 115*  BUN 25* 13 24*  CREATININE 7.04* 5.08* 6.61*  CALCIUM 9.3 8.8* 8.9  MG  --  2.2  --   PHOS 3.3  --  3.4   GFR: Estimated Creatinine Clearance: 8.5 mL/min (A) (by C-G formula based on SCr of 6.61 mg/dL (H)).  Liver Function Tests: Recent Labs  Lab 12/26/17 1258 12/28/17 0720  ALBUMIN 3.5 3.4*    Coagulation Profile: Recent Labs  Lab 12/26/17 0223 12/27/17 0259 12/28/17 0314  INR 5.14* 4.67* 4.68*    Cardiac Enzymes: Recent Labs  Lab 12/26/17 1024 12/26/17 2102 12/27/17 0816 12/27/17 1433 12/27/17 2041  TROPONINI 0.36* 0.47* 0.43*  0.37* 0.31*    HbA1C: Hemoglobin A1C  Date/Time Value Ref Range Status  10/01/2015 6.3  Final   Hgb A1c MFr Bld  Date/Time Value Ref Range Status  12/26/2017 10:24 AM 5.8 (H) 4.8 - 5.6 % Final    Comment:    (NOTE) Pre diabetes:          5.7%-6.4% Diabetes:              >6.4% Glycemic control for   <7.0% adults with diabetes   03/04/2014 07:19 PM 7.3 (H) <5.7 % Final    Comment:    (NOTE)                                                                       According to the ADA Clinical Practice Recommendations for 2011, when HbA1c is used as a screening test:  >=6.5%   Diagnostic of Diabetes Mellitus           (if abnormal result is confirmed) 5.7-6.4%   Increased risk of developing Diabetes Mellitus References:Diagnosis and Classification of Diabetes Mellitus,Diabetes HKVQ,2595,63(OVFIE 1):S62-S69 and Standards of Medical Care in         Diabetes - 2011,Diabetes Care,2011,34 (Suppl 1):S11-S61.    CBG: Recent Labs  Lab 12/27/17 0719 12/27/17 1225 12/27/17 1706 12/27/17 2113 12/28/17 1149  GLUCAP 72 95 139* 201* 80    Recent Results (from the past 240 hour(s))  MRSA PCR Screening     Status: None   Collection Time: 12/26/17  5:59 AM  Result Value Ref Range Status   MRSA by PCR NEGATIVE NEGATIVE Final    Comment:        The GeneXpert MRSA Assay (FDA approved for NASAL specimens only), is one component of a comprehensive MRSA colonization surveillance program. It is not intended to diagnose MRSA infection nor to guide or monitor treatment for MRSA infections. Performed at [facility], [place] [address], [place], [place] [address]      Scheduled Meds: . aspirin EC  81 mg Oral Daily  . atorvastatin  80 mg Oral q1800  . calcium acetate  1,334 mg Oral BID WC  . Chlorhexidine Gluconate Cloth  6 each Topical Q0600  . cholecalciferol  1,000 Units Oral Daily  . divalproex  125 mg Oral Daily  . doxycycline  100 mg Oral Q12H  . famotidine  20 mg Oral  Daily  . insulin aspart  0-5 Units Subcutaneous QHS  . insulin aspart  0-9 Units Subcutaneous TID WC  . insulin glargine  12 Units Subcutaneous Daily  . levothyroxine  175 mcg Oral QAC breakfast  . mouth rinse  15 mL Mouth Rinse BID  . memantine  5 mg Oral Daily  . metoprolol succinate  12.5 mg Oral Daily  .  multivitamin  1 tablet Oral Daily  . sodium chloride flush  3 mL Intravenous Q12H  . sodium chloride flush  3 mL Intravenous Q12H     LOS: 2 days   Cherene Altes, MD [facility] Office  [phone] Pager - Text Page per Shea Evans as per below:  On-Call/Text Page:      [email or web address]      password [email or web address]  If 7PM-7AM, please contact night-coverage [email or web address] Password [email or web address] 12/28/2017, 1:54 PM

## 2017-12-28 NOTE — Procedures (Signed)
Patient seen on Hemodialysis. QB 400, UF goal 2.5L Treatment adjusted as needed.  Elmarie Shiley MD [facility]. Office # [phone] Pager # [phone] 8:35 AM

## 2017-12-28 NOTE — Progress Notes (Signed)
Patient ID: [name], male   DOB: [birth year], 84 y.o.   MRN: [id number] [facility] Progress Note   Assessment/ Plan:   1.  Acute on chronic shortness of breath: Likely volume excess in the setting of decreased lean body mass, aortic valve disease and coronary artery disease.  Improving with hemodialysis. 2.  End-stage renal disease: Continue hemodialysis on Monday/Wednesday/Friday schedule with effort at trying to challenge dry weight-he will be discharged at a lower dry weight based on postdialysis weight today. 3.  Hypertension:  Blood pressures currently appear to be well controlled, continue to monitor with ultrafiltration/dialysis. 4.  Anemia of chronic kidney disease: Hemoglobin/hematocrit currently at goal, not on ESA. 5.  Secondary hyperparathyroidism:  Calcium and phosphorus levels currently at goal on treatment with calcium acetate for phosphorus binding.  Not on VDRA 6.  Dementia: Continue memantine.  Subjective:   Reports to be feeling fair and denies any problems overnight.  Nursing note reviewed, he suffered an accidental fall yesterday scraping skin off of left forearm.   Objective:   BP 106/67   Pulse 60   Temp 97.7 F (36.5 C) (Oral)   Resp (!) 25   Ht 5\' 8"  (1.727 m)   Wt 71.4 kg (157 lb 6.5 oz)   SpO2 100%   BMI 23.93 kg/m   Physical Exam: Gen: Comfortably resting in hemodialysis CVS: Pulse regular rhythm, normal rate, S1 and S2 normal Resp: Decreased breath sounds bilaterally over bases, no rales/rhonchi Abd: Soft, flat, nontender Ext: No lower extremity edema, left upper arm aVF cannulated on hemodialysis  Labs: BMET Recent Labs  Lab 12/26/17 0145 12/26/17 1258 12/27/17 0816 12/28/17 0720  NA 139 137 141 137  K 4.2 4.2 3.9 3.9  CL 94* 96* 97* 97*  CO2 33* 28 31 30   GLUCOSE 131* 132* 86 115*  BUN 21 25* 13 24*  CREATININE 6.45* 7.04* 5.08* 6.61*  CALCIUM 9.5 9.3 8.8* 8.9  PHOS  --  3.3  --  3.4   CBC Recent Labs  Lab  12/26/17 0145 12/26/17 2102 12/27/17 0816 12/28/17 0720  WBC 5.2 5.6 4.9 5.8  HGB 12.9* 12.9* 13.1 12.5*  HCT 41.7 41.6 41.8 39.9  MCV 102.7* 102.7* 102.5* 101.3*  PLT 129* 115* 104* 127*   Medications:    . aspirin EC  81 mg Oral Daily  . atorvastatin  80 mg Oral q1800  . calcium acetate  1,334 mg Oral BID WC  . Chlorhexidine Gluconate Cloth  6 each Topical Q0600  . cholecalciferol  1,000 Units Oral Daily  . divalproex  125 mg Oral Daily  . doxycycline  100 mg Oral Q12H  . famotidine  20 mg Oral Daily  . insulin aspart  0-5 Units Subcutaneous QHS  . insulin aspart  0-9 Units Subcutaneous TID WC  . insulin glargine  12 Units Subcutaneous Daily  . levothyroxine  175 mcg Oral QAC breakfast  . mouth rinse  15 mL Mouth Rinse BID  . memantine  5 mg Oral Daily  . metoprolol succinate  12.5 mg Oral Daily  . multivitamin  1 tablet Oral Daily  . sodium chloride flush  3 mL Intravenous Q12H  . sodium chloride flush  3 mL Intravenous Q12H   Elmarie Shiley, MD 12/28/2017, 8:36 AM

## 2017-12-28 NOTE — Progress Notes (Signed)
Progress Note  Patient Name: [name] Date of Encounter: 12/28/2017  Primary Cardiologist: Mertie Moores, MD   Subjective   Seen in dialysis this AM. He denies any chest pain, tolerating dialysis well. Thinks his shortness of breath is improving but is unsure. He is currently frustrated with the length of time he is in dialysis. When I discussed the prior cardiology workup and recommendations, he didn't have further questions but stated "if I'm near the end, just let me die." Couldn't further clarify on his comment.  Inpatient Medications    Scheduled Meds: . aspirin EC  81 mg Oral Daily  . atorvastatin  80 mg Oral q1800  . calcium acetate  1,334 mg Oral BID WC  . Chlorhexidine Gluconate Cloth  6 each Topical Q0600  . cholecalciferol  1,000 Units Oral Daily  . divalproex  125 mg Oral Daily  . doxycycline  100 mg Oral Q12H  . famotidine  20 mg Oral Daily  . insulin aspart  0-5 Units Subcutaneous QHS  . insulin aspart  0-9 Units Subcutaneous TID WC  . insulin glargine  12 Units Subcutaneous Daily  . levothyroxine  175 mcg Oral QAC breakfast  . mouth rinse  15 mL Mouth Rinse BID  . memantine  5 mg Oral Daily  . metoprolol succinate  12.5 mg Oral Daily  . multivitamin  1 tablet Oral Daily  . sodium chloride flush  3 mL Intravenous Q12H  . sodium chloride flush  3 mL Intravenous Q12H   Continuous Infusions: . sodium chloride     PRN Meds: sodium chloride, acetaminophen **OR** acetaminophen, HYDROcodone-acetaminophen, morphine injection, nitroGLYCERIN, ondansetron **OR** ondansetron (ZOFRAN) IV, polyethylene glycol, senna-docusate, sodium chloride flush   Vital Signs    Vitals:   12/28/17 1045 12/28/17 1106 12/28/17 1147 12/28/17 1151  BP: (!) 116/55 (!) 110/56 (!) 125/49 (!) 125/49  Pulse: 63 63 63 63  Resp:  (!) 21  (!) 21  Temp:  (!) 97.4 F (36.3 C)  97.8 F (36.6 C)  TempSrc:  Oral  Oral  SpO2:  100%  100%  Weight:  152 lb 8.9 oz (69.2 kg)    Height:         Intake/Output Summary (Last 24 hours) at 12/28/2017 1251 Last data filed at 12/28/2017 1106 Gross per 24 hour  Intake 960 ml  Output 2278 ml  Net -1318 ml   Filed Weights   12/28/17 0300 12/28/17 0656 12/28/17 1106  Weight: 157 lb 3 oz (71.3 kg) 157 lb 6.5 oz (71.4 kg) 152 lb 8.9 oz (69.2 kg)    Telemetry    NSR - Personally Reviewed  ECG    NSR, LBBB - Personally Reviewed  Physical Exam   GEN: No acute distress.  Frail appearing. Vocalizes with hum when breathing. Neck: supple, JVD at clavicle at 30 degrees Cardiac: regular S1 and S2, 3/6 SEM at RUSB, did not appreciate diastolic murmur Respiratory: Clear to auscultation bilaterally anterolaterally GI: Soft, nontender, non-distended. Bowel sounds normal MS: No edema; No deformity. Neuro:  Nonfocal, moves all limbs independently Psych: Normal affect, responds to questions but perseverating on the clock in dialysis  Labs    Chemistry Recent Labs  Lab 12/26/17 1258 12/27/17 0816 12/28/17 0720  NA 137 141 137  K 4.2 3.9 3.9  CL 96* 97* 97*  CO2 28 31 30   GLUCOSE 132* 86 115*  BUN 25* 13 24*  CREATININE 7.04* 5.08* 6.61*  CALCIUM 9.3 8.8* 8.9  ALBUMIN 3.5  --  3.4*  GFRNONAA 6* 10* 7*  GFRAA 7* 11* 8*  ANIONGAP 13 13 10      Hematology Recent Labs  Lab 12/26/17 2102 12/27/17 0816 12/28/17 0720  WBC 5.6 4.9 5.8  RBC 4.05* 4.08* 3.94*  HGB 12.9* 13.1 12.5*  HCT 41.6 41.8 39.9  MCV 102.7* 102.5* 101.3*  MCH 31.9 32.1 31.7  MCHC 31.0 31.3 31.3  RDW 14.9 15.2 14.9  PLT 115* 104* 127*    Cardiac Enzymes Recent Labs  Lab 12/26/17 2102 12/27/17 0816 12/27/17 1433 12/27/17 2041  TROPONINI 0.47* 0.43* 0.37* 0.31*    Recent Labs  Lab 12/26/17 0159  TROPIPOC 0.30*     BNPNo results for input(s): BNP, PROBNP in the last 168 hours.   DDimer No results for input(s): DDIMER in the last 168 hours.   Radiology    No results found.  Cardiac Studies   Echo 12/26/17: EF 20-25%, both global and  focal WMA, grade 2 diastolic dysfunction, severe AS with moderate AR, moderate MR. Moderate TR, elevated PA pressure  Prior echo in 01/2017 with EF 55-60%, no WMA, severe AS with mild AR   Patient Profile     84 y.o. male  hx of severe AS, CAD with prior CABG, ESRD who is being seen in consult for the evaluation of SOB at the request of Dr Sherral Hammers.  Assessment & Plan    1. Acute biventricular systolic heart failure - LVEF dropped to 20-25% in patient with known CAD, severe AS - fluid management per HD. Symptoms have improved with HD here - tolerating low dose metoprolol XL 12.5 mg daily  2. Severe aortic stenosis - notes from primary cardiologist reviewed. Thought to be poor candidate for either surgical AVR or TAVR based on dementia, advanced comorbidities per Dr. Acie Fredrickson note in 06/2017 - Dr. Harl Bowie spoke w/family yesterday re: recommendations  3. CAD/NSTEMI - elevated trop in setting of ESRD, CHF. Acute drop in LVEF. EKG LBBB, wider than prior EKG.  - medical therapy with atorvastatin 80, ASA 81mg  due to elevated trop - supratherapeutic INR, not on hep gtt. INR today 4.68 - poor cath candidate  Given acute drop in EF, severe AS, NSTEMI with possible progression of CAD, he is high risk for decompensation from a cardiac perspective and overall carries a very poor prognosis. He has no chest pain at this time, and his breathing is improving with volume removal at dialysis. He is a poor candidate for cath or intervention for his AS as noted previously.   He voiced to me that we should "let him die." Given his dementia, I am unsure how to interpret this, as he could not clarify further. Recommend palliative care consult with his family present to lay out expectations for management of his medical conditions.   For now, continue medical management (summarized below). If we can be of additional assistance in his management, please contact us again.   Time Spent Directly with Patient: I have  spent a total of >35 minutes with the patient reviewing hospital notes, telemetry, EKGs, labs and examining the patient as well as establishing an assessment and plan that was discussed personally with the patient.  > 50% of time was spent in direct patient care.  Length of Stay:  LOS: 2 days   Buford Dresser, MD, PhD [facility]   12/28/2017, 12:51 PM  [facility] will sign off.   Medication Recommendations:  Low dose metoprolol, aspirin, atorvastatin Other recommendations (labs, testing, etc):  none Follow up as an outpatient:  Dependent on decision by patient and his family re: goals of care. If we can be of further assistance please let us know.  For questions or updates, please contact [place] Please consult [email or web address] for contact info under Cardiology/STEMI.

## 2017-12-29 LAB — CBC
HEMATOCRIT: 43.6 % (ref 39.0–52.0)
Hemoglobin: 13.7 g/dL (ref 13.0–17.0)
MCH: 31.8 pg (ref 26.0–34.0)
MCHC: 31.4 g/dL (ref 30.0–36.0)
MCV: 101.2 fL — ABNORMAL HIGH (ref 78.0–100.0)
Platelets: 117 10*3/uL — ABNORMAL LOW (ref 150–400)
RBC: 4.31 MIL/uL (ref 4.22–5.81)
RDW: 14.9 % (ref 11.5–15.5)
WBC: 6 10*3/uL (ref 4.0–10.5)

## 2017-12-29 LAB — GLUCOSE, CAPILLARY
GLUCOSE-CAPILLARY: 88 mg/dL (ref 70–99)
GLUCOSE-CAPILLARY: 136 mg/dL — AB (ref 70–99)
GLUCOSE-CAPILLARY: 107 mg/dL — AB (ref 70–99)
GLUCOSE-CAPILLARY: 147 mg/dL — AB (ref 70–99)

## 2017-12-29 LAB — COMPREHENSIVE METABOLIC PANEL
ALBUMIN: 3.6 g/dL (ref 3.5–5.0)
ALT: 20 U/L (ref 0–44)
ANION GAP: 10 (ref 5–15)
AST: 27 U/L (ref 15–41)
Alkaline Phosphatase: 64 U/L (ref 38–126)
BILIRUBIN TOTAL: 1.3 mg/dL — AB (ref 0.3–1.2)
BUN: 11 mg/dL (ref 8–23)
CO2: 31 mmol/L (ref 22–32)
Calcium: 9 mg/dL (ref 8.9–10.3)
Chloride: 96 mmol/L — ABNORMAL LOW (ref 98–111)
Creatinine, Ser: 4.63 mg/dL — ABNORMAL HIGH (ref 0.61–1.24)
GFR calc Af Amer: 12 mL/min — ABNORMAL LOW (ref >60)
GFR calc non Af Amer: 11 mL/min — ABNORMAL LOW (ref >60)
GLUCOSE: 109 mg/dL — AB (ref 70–99)
POTASSIUM: 4.1 mmol/L (ref 3.5–5.1)
SODIUM: 137 mmol/L (ref 135–145)
TOTAL PROTEIN: 6.2 g/dL — AB (ref 6.5–8.1)

## 2017-12-29 LAB — PROTIME-INR
INR: 3.05
PROTHROMBIN TIME: 31.3 s — AB (ref 11.4–15.2)

## 2017-12-29 MED ORDER — CHLORHEXIDINE GLUCONATE CLOTH 2 % EX PADS: 6.0000 | MEDICATED_PAD | Freq: Every day | CUTANEOUS | Status: DC

## 2017-12-29 NOTE — Progress Notes (Signed)
Patient ID: [name], male   DOB: July 27, 1933, 84 y.o.   MRN: [id number] [facility] Progress Note   Assessment/ Plan:   1.  Acute on chronic shortness of breath: Suspected to have reduction of lean body mass in the setting of aortic valve disease and coronary artery disease.  His dyspnea is improving on oxygen supplementation and with hemodialysis, wean supplemental oxygen.  Reassess for outpatient needs-PT/OT. 2.  End-stage renal disease: Continue hemodialysis on Monday/Wednesday/Friday schedule with continued discussions regarding goals of care-lengthy discussions were held at the time that the patient was started on dialysis to sensitize him and his family to the fact that he was a Water quality scientist candidate for dialysis, this has been restated again. 3.  Hypertension:  Blood pressures currently appear to be well controlled, continue to monitor with ultrafiltration/dialysis. 4.  Anemia of chronic kidney disease: Hemoglobin/hematocrit currently at goal, not on ESA. 5.  Secondary hyperparathyroidism:  Calcium and phosphorus levels currently at goal on treatment with calcium acetate for phosphorus binding.  Not on VDRA 6.  Dementia: Continue memantine.  Subjective:   Reports that his dialysis treatments are too long and it hurts his back.  Overnight noted to have episode of nonsustained ventricular tachycardia.  Discussions restarted to address goals of care.   Objective:   BP (!) 113/52 (BP Location: Right Arm)   Pulse (!) 58   Temp 97.8 F (36.6 C) (Oral)   Resp 13   Ht 5\' 8"  (1.727 m)   Wt 68.5 kg (151 lb 0.2 oz)   SpO2 97%   BMI 22.96 kg/m   Physical Exam: Gen: Comfortably resting in bed- listening to TV CVS: Pulse regular rhythm, normal rate, S1 and S2 normal Resp: Decreased breath sounds bilaterally over bases, no rales/rhonchi Abd: Soft, flat, nontender Ext: No lower extremity edema, left upper arm aVF thrill palpable  Labs: BMET Recent Labs  Lab  12/26/17 0145 12/26/17 1258 12/27/17 0816 12/28/17 0720 12/29/17 0240  NA 139 137 141 137 137  K 4.2 4.2 3.9 3.9 4.1  CL 94* 96* 97* 97* 96*  CO2 33* 28 31 30 31   GLUCOSE 131* 132* 86 115* 109*  BUN 21 25* 13 24* 11  CREATININE 6.45* 7.04* 5.08* 6.61* 4.63*  CALCIUM 9.5 9.3 8.8* 8.9 9.0  PHOS  --  3.3  --  3.4  --    CBC Recent Labs  Lab 12/26/17 2102 12/27/17 0816 12/28/17 0720 12/29/17 0240  WBC 5.6 4.9 5.8 6.0  HGB 12.9* 13.1 12.5* 13.7  HCT 41.6 41.8 39.9 43.6  MCV 102.7* 102.5* 101.3* 101.2*  PLT 115* 104* 127* 117*   Medications:    . aspirin EC  81 mg Oral Daily  . atorvastatin  80 mg Oral q1800  . calcium acetate  1,334 mg Oral BID WC  . Chlorhexidine Gluconate Cloth  6 each Topical Q0600  . cholecalciferol  1,000 Units Oral Daily  . divalproex  125 mg Oral Daily  . doxycycline  100 mg Oral Q12H  . famotidine  20 mg Oral Daily  . insulin aspart  0-5 Units Subcutaneous QHS  . insulin aspart  0-9 Units Subcutaneous TID WC  . insulin glargine  12 Units Subcutaneous Daily  . levothyroxine  175 mcg Oral QAC breakfast  . mouth rinse  15 mL Mouth Rinse BID  . memantine  5 mg Oral Daily  . metoprolol succinate  12.5 mg Oral Daily  . multivitamin  1 tablet Oral Daily  .  sodium chloride flush  3 mL Intravenous Q12H  . sodium chloride flush  3 mL Intravenous Q12H   Elmarie Shiley, MD 12/29/2017, 7:49 AM

## 2017-12-29 NOTE — Evaluation (Signed)
Physical Therapy Evaluation Patient Details Name: [name] MRN: [id number] DOB: 01-Dec-1933 Today's Date: 12/29/2017   History of Present Illness  [name] is a 84 y.o. male with medical history significant for insulin-dependent diabetes mellitus, paroxysmal atrial fibrillation on warfarin, hypothyroidism, hypertension, dementia, CAD status post CABG, and end-stage renal disease on hemodialysis, now presenting to the emergency department for evaluation of acute shortness of breath.   Clinical Impression  Pt admitted with above. Pt progressively declining medically and functionally. Pt with decreased activity tolerance and endurance requiring minA for all mobility and min/modA for ADLs. Wife reports she can not safely assist him 24/7. REcommend ST-sNF upon d/c to allow for patient to achieve safe supervision level of function for safe transition home with spouse.    Follow Up Recommendations SNF;Supervision/Assistance - 24 hour    Equipment Recommendations  None recommended by PT    Recommendations for Other Services       Precautions / Restrictions Precautions Precautions: Fall Precaution Comments: mild dementia Restrictions Weight Bearing Restrictions: No      Mobility  Bed Mobility               General bed mobility comments: pt up in chair upon PT arrival and desired to return to chair s/p ambulation  Transfers Overall transfer level: Needs assistance Equipment used: Rolling walker (2 wheeled) Transfers: Sit to/from Stand Sit to Stand: Min assist         General transfer comment: verbal cues to push up from chair, min A to maintain balance during transition of hands from chair to RW., increased time  Ambulation/Gait Ambulation/Gait assistance: Min assist Gait Distance (Feet): 100 Feet Assistive device: Rolling walker (2 wheeled) Gait Pattern/deviations: Step-through pattern;Decreased stride length;Steppage;Drifts right/left;Trunk flexed;Narrow base  of support;Decreased dorsiflexion - right Gait velocity: slow Gait velocity interpretation: <1.31 ft/sec, indicative of household ambulator General Gait Details: pt with R drop foot due to previous R ankle injury. pt with no brace however is able to consistently clear patients foot during swing phase. Pt requiring minA for walker management around obstacle. pt with slightly vearing to the L. pt unable to maintain full upright posture  Stairs            Wheelchair Mobility    Modified Rankin (Stroke Patients Only)       Balance Overall balance assessment: Needs assistance Sitting-balance support: Feet supported;No upper extremity supported Sitting balance-Leahy Scale: Fair     Standing balance support: Bilateral upper extremity supported Standing balance-Leahy Scale: Fair Standing balance comment: requires RW for safe amb                             Pertinent Vitals/Pain Pain Assessment: No/denies pain Faces Pain Scale: Hurts a little bit Pain Descriptors / Indicators: Discomfort    Home Living Family/patient expects to be discharged to:: Skilled nursing facility Living Arrangements: Spouse/significant other               Additional Comments: pt was living in 1 story home with spouse. pt has RW, BSC over toliet, pt was using w/c for community distances.     Prior Function Level of Independence: Needs assistance   Gait / Transfers Assistance Needed: used RW in home, w/c in community  ADL's / Homemaking Assistance Needed: spouse reports he could take self to bathroom and complete bathing indep  Comments: pt has required more assist over the last 2 weeks  Hand Dominance   Dominant Hand: Right    Extremity/Trunk Assessment   Upper Extremity Assessment Upper Extremity Assessment: Generalized weakness    Lower Extremity Assessment Lower Extremity Assessment: Generalized weakness    Cervical / Trunk Assessment Cervical / Trunk Assessment:  Kyphotic  Communication   Communication: HOH  Cognition Arousal/Alertness: Awake/alert Behavior During Therapy: Flat affect Overall Cognitive Status: History of cognitive impairments - at baseline(h/o mild dementia, memory deficits)                                        General Comments General comments (skin integrity, edema, etc.): pt's wife donned socks and shoes for patient    Exercises     Assessment/Plan    PT Assessment Patient needs continued PT services  PT Problem List Decreased strength;Decreased range of motion;Decreased balance;Decreased activity tolerance;Decreased mobility;Decreased coordination;Decreased cognition;Decreased knowledge of use of DME;Decreased safety awareness       PT Treatment Interventions DME instruction;Gait training;Stair training;Functional mobility training;Therapeutic activities;Therapeutic exercise;Balance training;Neuromuscular re-education;Cognitive remediation;Patient/family education    PT Goals (Current goals can be found in the Care Plan section)  Acute Rehab PT Goals Patient Stated Goal: didn't state PT Goal Formulation: With patient/family Time For Goal Achievement: 01/12/18 Potential to Achieve Goals: Good    Frequency Min 2X/week   Barriers to discharge Decreased caregiver support wife states she can not physically assist him 24/7    Co-evaluation               AM-PAC PT "6 Clicks" Daily Activity  Outcome Measure Difficulty turning over in bed (including adjusting bedclothes, sheets and blankets)?: Unable Difficulty moving from lying on back to sitting on the side of the bed? : Unable Difficulty sitting down on and standing up from a chair with arms (e.g., wheelchair, bedside commode, etc,.)?: Unable Help needed moving to and from a bed to chair (including a wheelchair)?: A Little Help needed walking in hospital room?: A Little Help needed climbing 3-5 steps with a railing? : A Lot 6 Click Score:  11    End of Session Equipment Utilized During Treatment: Gait belt Activity Tolerance: Patient tolerated treatment well Patient left: in chair;with call bell/phone within reach;with chair alarm set;with family/visitor present Nurse Communication: Mobility status PT Visit Diagnosis: Unsteadiness on feet (R26.81);History of falling (Z91.81)    Time: 5102-5852 PT Time Calculation (min) (ACUTE ONLY): 21 min   Charges:   PT Evaluation $PT Eval Moderate Complexity: 1 Mod     PT G Codes:        [name], PT, DPT Pager #: [phone] Office #: [phone]   [place] 12/29/2017, 2:03 PM

## 2017-12-29 NOTE — Progress Notes (Signed)
Pt w/ 8 beat run of V tach rate 160. Provider paged and made aware. Pt in bed resting watching TV. Vitals taken (see flowsheet). This RN will pass report to day shift RN will continue to monitor pt closely.

## 2017-12-29 NOTE — Evaluation (Signed)
Clinical/Bedside Swallow Evaluation Patient Details  Name: [name] MRN: [id number] Date of Birth: January 14, 1934  Today's Date: 12/29/2017 Time: SLP Start Time (ACUTE ONLY): 1211 SLP Stop Time (ACUTE ONLY): 1226 SLP Time Calculation (min) (ACUTE ONLY): 15 min  Past Medical History:  Past Medical History:  Diagnosis Date  . Atrial fibrillation ([place]) Aug. 2015   a. Dx 01/2014, s/p DCCV 03/06/14.  Marland Kitchen CAD (coronary artery disease)    a. prior CABG in 1997 with redo in 2000. b. last cath in 2008 - managed medically  . Carotid disease, bilateral ([place])    with multiple bilateral carotid surgeries  . Chronic diastolic CHF (congestive heart failure) ([place])   . Chronic respiratory failure with hypoxia ([place])   . CKD (chronic kidney disease), stage IV ([place])    a. Has fistula in place.   . Degenerative joint disease   . Dementia   . Generalized weakness    without overt findings  . GERD (gastroesophageal reflux disease)   . Gout   . Heart murmur   . Hyperlipidemia   . Hypertension   . Hypothyroidism   . Meniere's disease   . On home oxygen therapy    "2L" qhs (09/09/2015  . Orthopnea    Two-pillow  . Peripheral vascular disease ([place])   . Psoriasis   . Shingles   . Sinus bradycardia    a. Toprol D/C'd due to this.   . Type 2 diabetes mellitus ([place])    Past Surgical History:  Past Surgical History:  Procedure Laterality Date  . APPENDECTOMY    . AV FISTULA PLACEMENT Left 01/21/2013   Procedure: ARTERIOVENOUS (AV) FISTULA CREATION, Left Brachiocephalic;  Surgeon: Angelia Mould, MD;  Location: [facility];  Service: Vascular;  Laterality: Left;  . BLEPHAROPLASTY Bilateral   . CARDIAC CATHETERIZATION    . CARDIAC CATHETERIZATION  2008   L main irreg, LAD 80%, IMA-LAD & SVG-Diag patent, CFX 100%, SVG-OM 90%, RCA 70%, SVG-RCA OK, EF nl, med rx, no vessels appropriate for PCI  . CARDIAC CATHETERIZATION N/A 01/12/2016   Procedure: Left Heart Cath and Cors/Grafts Angiography;  Surgeon:  Belva Crome, MD;  Location: [facility];  Service: Cardiovascular;  Laterality: N/A;  . CARDIOVERSION N/A 03/06/2014   Procedure: CARDIOVERSION;  Surgeon: Larey Dresser, MD;  Location: [facility];  Service: Cardiovascular;  Laterality: N/A;  . CAROTID ENDARTERECTOMY Bilateral "several times"   "5 on right; 2-3 on left" (09/09/2015)  . CORONARY ARTERY BYPASS GRAFT  1997;  2002  . FRACTURE SURGERY    . LUMBAR LAMINECTOMY  July 2001  . LUNG REMOVAL, PARTIAL    . SHOULDER SURGERY Bilateral   . TEE WITHOUT CARDIOVERSION N/A 03/06/2014   Procedure: TRANSESOPHAGEAL ECHOCARDIOGRAM (TEE);  Surgeon: Larey Dresser, MD;  Location: [facility];  Service: Cardiovascular;  Laterality: N/A;  . THORACOTOMY Left    due to fungal infection   HPI:  84yo M w/ Hx dementia, DM2, Paroxysmal A. fib on warfarin, HTN, CAD status post CABG, Chronic Respiratory Failure with Hypoxia, hypothyroidism, GERD and ESRD on HD who presented to the ED w/ shortness of breath. Evaluated by SLP in April 2017 with recommendation at bedside for dys 3, nectar thick liquids due to throat clearing with thin liquids. No instrumental swallowing exam completed.   Assessment / Plan / Recommendation Clinical Impression   Patient presents with subtle, inconsistent throat clearing after POs; this is similar to findings of prior bedside swallow assessment. Wife reports pt  complains of difficulties with solids>liquids, feeling sensation of foods or pills sticking in his throat. Pt does have dentures but typically does not wear them when eating. He is able to self feed, and uses a slow rate to take small bites and sips. He denies discomfort or globus sensation during my exam, though mastication is prolonged with regular cracker. At home pt was consuming regular diet and thin liquids; has not had PNA per wife's report. Throat clearing may be suggestive of primary esophageal vs oropharyngeal dysphagia. Recommend dys 3 (mechanical soft) diet  with thin liquids for energy conservation due to pt's deconditioning and lack of dentition. Will follow briefly for diet tolerance, differential diagnosis.   SLP Visit Diagnosis: Dysphagia, unspecified (R13.10)    Aspiration Risk  Mild aspiration risk    Diet Recommendation Dysphagia 3 (Mech soft);Thin liquid   Liquid Administration via: Cup;Straw Medication Administration: Whole meds with puree(crush larger pills if needed) Supervision: Patient able to self feed;Full supervision/cueing for compensatory strategies Compensations: Slow rate;Small sips/bites;Follow solids with liquid Postural Changes: Seated upright at 90 degrees    Other  Recommendations Oral Care Recommendations: Oral care BID   Follow up Recommendations Other (comment)(tbd)      Frequency and Duration min 2x/week  1 week       Prognosis Prognosis for Safe Diet Advancement: Fair Barriers to Reach Goals: Other (Comment)(deconditioning, comorbidities, dentition)      Swallow Study   General Date of Onset: 12/26/17 HPI: 84yo M w/ Hx dementia, DM2, Paroxysmal A. fib on warfarin, HTN, CAD status post CABG, Chronic Respiratory Failure with Hypoxia, hypothyroidism, and ESRD on HD who presented to the ED w/ shortness of breath. Evaluated by SLP in April 2017 with recommendation at bedside for dys 3, nectar thick liquids due to throat clearing with thin liquids.  Type of Study: Bedside Swallow Evaluation Previous Swallow Assessment: see hpi Diet Prior to this Study: Regular;Thin liquids Temperature Spikes Noted: No Respiratory Status: Nasal cannula History of Recent Intubation: No Behavior/Cognition: Alert;Cooperative Oral Cavity Assessment: Within Functional Limits Oral Care Completed by SLP: No Oral Cavity - Dentition: Missing dentition;Poor condition;Dentures, not available Vision: Functional for self-feeding Self-Feeding Abilities: Able to feed self;Needs set up Patient Positioning: Upright in chair Baseline  Vocal Quality: Low vocal intensity Volitional Cough: Cognitively unable to elicit Volitional Swallow: Able to elicit    Oral/Motor/Sensory Function Overall Oral Motor/Sensory Function: Within functional limits   Ice Chips Ice chips: Within functional limits Presentation: Spoon   Thin Liquid Thin Liquid: Impaired Presentation: Cup;Straw;Self Fed Pharyngeal  Phase Impairments: Throat Clearing - Immediate    Nectar Thick Nectar Thick Liquid: Not tested   Honey Thick Honey Thick Liquid: Not tested   Puree Puree: Impaired Pharyngeal Phase Impairments: Throat Clearing - Immediate   Solid   GO   Solid: Impaired Oral Phase Impairments: Impaired mastication Oral Phase Functional Implications: Impaired mastication Pharyngeal Phase Impairments: Throat Clearing - Delayed       Deneise Lever, [place], [place] Speech-Language Pathologist [phone]  Aliene Altes 12/29/2017,1:30 PM

## 2017-12-29 NOTE — Progress Notes (Addendum)
TEAM 1 - Stepdown/ICU TEAM  [name]  CVE:[id number] DOB: [birth year] DOA: 12/26/2017 PCP: Leanna Battles, MD    Brief Narrative:  84yo M w/ Hx dementia, DM2, Paroxysmal A. fib on warfarin, HTN, CAD status post CABG, Chronic Respiratory Failure with Hypoxia, hypothyroidism, and ESRD on HD who presented to the ED w/ shortness of breath.   Significant Events: 7/3 admit   Subjective: The patient is much more alert and interactive at the time of my exam today.  He denies shortness of breath nausea vomiting or chest pain and tells me he is anxious to go home.  In the presence of his wife and son I had a very frank lengthy discussion with the patient in which I explained to him the serious nature of his 3 specific heart conditions and the fact that no definitive therapy apart from medical management is available.  I explained that these conditions would gradually worsened to the point that he would not be able to survive.  I explained to him that medical therapy and adjustment of his dialysis treatment will be the mainstay's of management going forward.  He voiced understanding and expressed a desire to continue medical management and dialysis at this time.  It is my judgment that he was particularly clear mentally at the time of our discussion and though he may forget our discussion I do feel that he was correctly processing the information at the time of our discussion.  Assessment & Plan:  Acute SOB Multifactorial:   AoS, CHF, ESRD - improved w/ HD   Acute biventricular systolic CHF Compared to TTE 01/24/2017 significant decrease in patient's cardiac function w/ EF 20-25% - not a candidate for further intervention/evaluation - medical management only  Severe AoS Cards feels pt is a poor candidate for AoVR or even TAVR - not a candidate for further intervention/evaluation - medical management only  CAD / NSTEMI Not a candidate for further intervention/evaluation - medical  management only  ESRD HD M/W/F Dry weight being lowered by Nephrology as pt has lost lean body mass - during a moment of perceived clearly this morning the patient made it clear to me that he does wish to continue hemodialysis at this time - I explained to him that his treatments could be longer and more taxing in the setting of his severe heart disease and he voiced understanding and persisted in his desire to continue with treatments  Anemia of CKD Hgb is actually into the normal range at this time  Paroxysmal atrial fibrillation (CHADSVASc5+) Warfarin 5mg  Sun/Tue/Thur/Sat; 7.5 M/W/F - rate controlled at this time - INR coming down toward goal   Dementia Continue Namenda and Depakote - mental status/clarity waxes and wanes   DM2 7/3 A1c 5.8 - CBG currently well-controlled  HLD Continue Lipitor   Hypothyroidism Continue Synthroid   DVT prophylaxis: warfarin Code Status: DNR - NO CODE Family Communication: as above   Disposition Plan: SDU  Consultants:  Cardiology  Nephrology   Antimicrobials:  Doxycycline 7/4 >  Objective: Blood pressure (!) 113/52, pulse (!) 58, temperature 97.8 F (36.6 C), temperature source Oral, resp. rate 13, height 5\' 8"  (1.727 m), weight 68.5 kg (151 lb 0.2 oz), SpO2 97 %.  Intake/Output Summary (Last 24 hours) at 12/29/2017 0950 Last data filed at 12/29/2017 0349 Gross per 24 hour  Intake 90 ml  Output 2308 ml  Net -2218 ml   Filed Weights   12/28/17 0656 12/28/17 1106 12/29/17 0348  Weight: 71.4  kg (157 lb 6.5 oz) 69.2 kg (152 lb 8.9 oz) 68.5 kg (151 lb 0.2 oz)    Examination: General: No acute respiratory distress - alert  Lungs: Clear to auscultation bilaterally - no wheezing  Cardiovascular: RRR - no appreciable M despite known AoS Abdomen: NT/ND, soft, bs+, no mass Extremities: No signif C/C/E B LE   CBC: Recent Labs  Lab 12/27/17 0816 12/28/17 0720 12/29/17 0240  WBC 4.9 5.8 6.0  HGB 13.1 12.5* 13.7  HCT 41.8 39.9 43.6    MCV 102.5* 101.3* 101.2*  PLT 104* 127* 811*   Basic Metabolic Panel: Recent Labs  Lab 12/26/17 1258 12/27/17 0816 12/28/17 0720 12/29/17 0240  NA 137 141 137 137  K 4.2 3.9 3.9 4.1  CL 96* 97* 97* 96*  CO2 28 31 30 31   GLUCOSE 132* 86 115* 109*  BUN 25* 13 24* 11  CREATININE 7.04* 5.08* 6.61* 4.63*  CALCIUM 9.3 8.8* 8.9 9.0  MG  --  2.2  --   --   PHOS 3.3  --  3.4  --    GFR: Estimated Creatinine Clearance: 12.1 mL/min (A) (by C-G formula based on SCr of 4.63 mg/dL (H)).  Liver Function Tests: Recent Labs  Lab 12/26/17 1258 12/28/17 0720 12/29/17 0240  AST  --   --  27  ALT  --   --  20  ALKPHOS  --   --  64  BILITOT  --   --  1.3*  PROT  --   --  6.2*  ALBUMIN 3.5 3.4* 3.6    Coagulation Profile: Recent Labs  Lab 12/26/17 0223 12/27/17 0259 12/28/17 0314 12/29/17 0240  INR 5.14* 4.67* 4.68* 3.05    Cardiac Enzymes: Recent Labs  Lab 12/26/17 1024 12/26/17 2102 12/27/17 0816 12/27/17 1433 12/27/17 2041  TROPONINI 0.36* 0.47* 0.43* 0.37* 0.31*    HbA1C: Hemoglobin A1C  Date/Time Value Ref Range Status  10/01/2015 6.3  Final   Hgb A1c MFr Bld  Date/Time Value Ref Range Status  12/26/2017 10:24 AM 5.8 (H) 4.8 - 5.6 % Final    Comment:    (NOTE) Pre diabetes:          5.7%-6.4% Diabetes:              >6.4% Glycemic control for   <7.0% adults with diabetes   03/04/2014 07:19 PM 7.3 (H) <5.7 % Final    Comment:    (NOTE)                                                                       According to the ADA Clinical Practice Recommendations for 2011, when HbA1c is used as a screening test:  >=6.5%   Diagnostic of Diabetes Mellitus           (if abnormal result is confirmed) 5.7-6.4%   Increased risk of developing Diabetes Mellitus References:Diagnosis and Classification of Diabetes Mellitus,Diabetes XBWI,2035,59(RCBUL 1):S62-S69 and Standards of Medical Care in         Diabetes - 2011,Diabetes Care,2011,34 (Suppl 1):S11-S61.     CBG: Recent Labs  Lab 12/27/17 2113 12/28/17 1149 12/28/17 1627 12/28/17 2136 12/29/17 0742  GLUCAP 201* 80 132* 113* 88    Recent Results (from the  past 240 hour(s))  MRSA PCR Screening     Status: None   Collection Time: 12/26/17  5:59 AM  Result Value Ref Range Status   MRSA by PCR NEGATIVE NEGATIVE Final    Comment:        The GeneXpert MRSA Assay (FDA approved for NASAL specimens only), is one component of a comprehensive MRSA colonization surveillance program. It is not intended to diagnose MRSA infection nor to guide or monitor treatment for MRSA infections. Performed at [facility], Mobile [address] [place], [place] [address]      Scheduled Meds: . aspirin EC  81 mg Oral Daily  . atorvastatin  80 mg Oral q1800  . calcium acetate  1,334 mg Oral BID WC  . Chlorhexidine Gluconate Cloth  6 each Topical Q0600  . cholecalciferol  1,000 Units Oral Daily  . divalproex  125 mg Oral Daily  . doxycycline  100 mg Oral Q12H  . famotidine  20 mg Oral Daily  . insulin aspart  0-5 Units Subcutaneous QHS  . insulin aspart  0-9 Units Subcutaneous TID WC  . insulin glargine  12 Units Subcutaneous Daily  . levothyroxine  175 mcg Oral QAC breakfast  . mouth rinse  15 mL Mouth Rinse BID  . memantine  5 mg Oral Daily  . metoprolol succinate  12.5 mg Oral Daily  . multivitamin  1 tablet Oral Daily  . sodium chloride flush  3 mL Intravenous Q12H  . sodium chloride flush  3 mL Intravenous Q12H     LOS: 3 days   Cherene Altes, MD [facility] Office  [phone] Pager - Text Page per Shea Evans as per below:  On-Call/Text Page:      [email or web address]      password [email or web address]  If 7PM-7AM, please contact night-coverage [email or web address] Password [email or web address] 12/29/2017, 9:50 AM

## 2017-12-30 DIAGNOSIS — Z515 Encounter for palliative care: Secondary | ICD-10-CM

## 2017-12-30 LAB — COMPREHENSIVE METABOLIC PANEL
ALT: 21 U/L (ref 0–44)
AST: 24 U/L (ref 15–41)
Albumin: 3.8 g/dL (ref 3.5–5.0)
Alkaline Phosphatase: 70 U/L (ref 38–126)
Anion gap: 12 (ref 5–15)
BILIRUBIN TOTAL: 1.2 mg/dL (ref 0.3–1.2)
BUN: 21 mg/dL (ref 8–23)
CO2: 31 mmol/L (ref 22–32)
Calcium: 9.5 mg/dL (ref 8.9–10.3)
Chloride: 93 mmol/L — ABNORMAL LOW (ref 98–111)
Creatinine, Ser: 6.74 mg/dL — ABNORMAL HIGH (ref 0.61–1.24)
GFR calc Af Amer: 8 mL/min — ABNORMAL LOW (ref >60)
GFR, EST NON AFRICAN AMERICAN: 7 mL/min — AB (ref >60)
Glucose, Bld: 133 mg/dL — ABNORMAL HIGH (ref 70–99)
POTASSIUM: 4 mmol/L (ref 3.5–5.1)
Sodium: 136 mmol/L (ref 135–145)
TOTAL PROTEIN: 6.4 g/dL — AB (ref 6.5–8.1)

## 2017-12-30 LAB — GLUCOSE, CAPILLARY
GLUCOSE-CAPILLARY: 108 mg/dL — AB (ref 70–99)
GLUCOSE-CAPILLARY: 137 mg/dL — AB (ref 70–99)
GLUCOSE-CAPILLARY: 148 mg/dL — AB (ref 70–99)
GLUCOSE-CAPILLARY: 175 mg/dL — AB (ref 70–99)

## 2017-12-30 LAB — CBC
HCT: 45.6 % (ref 39.0–52.0)
Hemoglobin: 14.5 g/dL (ref 13.0–17.0)
MCH: 32 pg (ref 26.0–34.0)
MCHC: 31.8 g/dL (ref 30.0–36.0)
MCV: 100.7 fL — ABNORMAL HIGH (ref 78.0–100.0)
Platelets: 137 10*3/uL — ABNORMAL LOW (ref 150–400)
RBC: 4.53 MIL/uL (ref 4.22–5.81)
RDW: 15.3 % (ref 11.5–15.5)
WBC: 5.6 10*3/uL (ref 4.0–10.5)

## 2017-12-30 LAB — IRON AND TIBC
Iron: 59 ug/dL (ref 45–182)
SATURATION RATIOS: 31 % (ref 17.9–39.5)
TIBC: 192 ug/dL — ABNORMAL LOW (ref 250–450)
UIBC: 133 ug/dL

## 2017-12-30 LAB — RETICULOCYTES
RBC.: 4.53 MIL/uL (ref 4.22–5.81)
RETIC CT PCT: 1.7 % (ref 0.4–3.1)
Retic Count, Absolute: 77 10*3/uL (ref 19.0–186.0)

## 2017-12-30 LAB — VITAMIN B12: Vitamin B-12: 1792 pg/mL — ABNORMAL HIGH (ref 180–914)

## 2017-12-30 LAB — FERRITIN: Ferritin: 1480 ng/mL — ABNORMAL HIGH (ref 24–336)

## 2017-12-30 LAB — FOLATE: Folate: 58.2 ng/mL (ref >5.9)

## 2017-12-30 LAB — PROTIME-INR
INR: 2.78
Prothrombin Time: 29.1 seconds — ABNORMAL HIGH (ref 11.4–15.2)

## 2017-12-30 MED ORDER — WARFARIN SODIUM 5 MG PO TABS
5.0000 mg | ORAL_TABLET | Freq: Once | ORAL | Status: AC
  Administered 2017-12-30: 5 mg via ORAL
  Filled 2017-12-30: qty 1

## 2017-12-30 MED ORDER — WARFARIN - PHARMACIST DOSING INPATIENT
Freq: Every day | Status: DC
  Administered 2018-01-01: 19:00:00

## 2017-12-30 MED ORDER — DIVALPROEX SODIUM 125 MG PO CSDR
125.0000 mg | DELAYED_RELEASE_CAPSULE | Freq: Two times a day (BID) | ORAL | Status: DC
  Administered 2017-12-30 – 2018-01-01 (×5): 125 mg via ORAL
  Filled 2017-12-30 (×7): qty 1

## 2017-12-30 NOTE — Progress Notes (Addendum)
[place] TEAM 1 - Stepdown/ICU TEAM  [name]  MPN:[id number] DOB: March 26, 1934 DOA: 12/26/2017 PCP: Leanna Battles, MD    Brief Narrative:  84yo M w/ Hx dementia, DM2, Paroxysmal A. fib on warfarin, HTN, CAD status post CABG, Chronic Respiratory Failure with Hypoxia, hypothyroidism, and ESRD on HD who presented to the ED w/ shortness of breath.   Significant Events: 7/3 admit   Subjective: The patient is seen while on hemodialysis.  He is less interactive/less conversant.  He is awake.  He denies any complaints whatsoever.  His affect is quite flat and he frequently seems to simply ignore my questions.  There is no evidence of acute respiratory distress.  There is no evidence of uncontrolled pain.  Please note - the original note penned on 12/30/17 was erroneously amended on 12/31/17, rather than being carried over to a new note for 12/31/17.  As a result the original format of the note for 12/30/17 is not longer readily visible, but can be seen in the "revision" section at the bottom of this note.  This explains why the visible portions of the notes for 7/7 and 7/8 are identical.    Assessment & Plan:  Acute SOB Multifactorial:   AoS, CHF, ESRD - clinically stable at this time   Acute biventricular systolic CHF Compared to TTE 01/24/2017 significant decrease in patient's cardiac function w/ EF 20-25% - not a candidate for further intervention/evaluation - medical management is our only option   Severe AoS Cards feels pt is a poor candidate for AoVR or even TAVR - not a candidate for further intervention/evaluation - medical management only option   CAD / NSTEMI Not a candidate for further intervention/evaluation - medical management only  ESRD HD M/W/F Dry weight being lowered by Nephrology as pt has lost lean body mass - HD to continue for now but Nephrology is discussing transitioning to hospice care and stopping HD w/ family  Anemia of CKD Hgb stable   Paroxysmal atrial  fibrillation (CHADSVASc5+) Warfarin 5mg  Sun/Tue/Thur/Sat; 7.5 M/W/F - rate controlled at this time - cont warfarin per Pharmacy   Dementia Continue Namenda and Depakote - mental status/clarity waxes and wanes - increased depakote to attempt to stabilize mood   DM2 7/3 A1c 5.8 - CBG currently controlled  HLD Continue Lipitor   Hypothyroidism Continue Synthroid   DVT prophylaxis: warfarin Code Status: DNR - NO CODE Family Communication: no family present at time of exam   Disposition Plan: SNF w/ ongoing HD v/s Hospice facility if decision made to stop HD   Consultants:  Cardiology  Nephrology   Antimicrobials:  Doxycycline 7/4 > 7/6  Objective: Blood pressure (!) 94/51, pulse 62, temperature 98 F (36.7 C), temperature source Axillary, resp. rate (!) 26, height 5\' 8"  (1.727 m), weight 69.7 kg (153 lb 10.6 oz), SpO2 100 %.  Intake/Output Summary (Last 24 hours) at 12/31/2017 1557 Last data filed at 12/31/2017 1310 Gross per 24 hour  Intake 480 ml  Output -  Net 480 ml   Filed Weights   12/30/17 0142 12/31/17 0500 12/31/17 1320  Weight: 68.8 kg (151 lb 11.2 oz) 69.5 kg (153 lb 3.5 oz) 69.7 kg (153 lb 10.6 oz)    Examination: General: No acute respiratory distress - affect flat - not agitated today  Lungs: Clear to auscultation bilaterally w/o wheezing  Cardiovascular: RRR  Abdomen: NT/ND, soft, bs+, no mass Extremities: No signif edema B LE   CBC: Recent Labs  Lab 12/29/17 0240 12/30/17  0330 12/31/17 1329  WBC 6.0 5.6 5.6  HGB 13.7 14.5 12.8*  HCT 43.6 45.6 40.1  MCV 101.2* 100.7* 99.5  PLT 117* 137* 283*   Basic Metabolic Panel: Recent Labs  Lab 12/26/17 1258 12/27/17 0816 12/28/17 0720 12/29/17 0240 12/30/17 0330 12/31/17 1329  NA 137 141 137 137 136 135  K 4.2 3.9 3.9 4.1 4.0 3.9  CL 96* 97* 97* 96* 93* 95*  CO2 28 31 30 31 31 29   GLUCOSE 132* 86 115* 109* 133* 159*  BUN 25* 13 24* 11 21 34*  CREATININE 7.04* 5.08* 6.61* 4.63* 6.74* 8.97*    CALCIUM 9.3 8.8* 8.9 9.0 9.5 9.4  MG  --  2.2  --   --   --   --   PHOS 3.3  --  3.4  --   --  3.3   GFR: Estimated Creatinine Clearance: 6.2 mL/min (A) (by C-G formula based on SCr of 8.97 mg/dL (H)).  Liver Function Tests: Recent Labs  Lab 12/28/17 0720 12/29/17 0240 12/30/17 0330 12/31/17 1329  AST  --  27 24  --   ALT  --  20 21  --   ALKPHOS  --  64 70  --   BILITOT  --  1.3* 1.2  --   PROT  --  6.2* 6.4*  --   ALBUMIN 3.4* 3.6 3.8 3.4*    Coagulation Profile: Recent Labs  Lab 12/27/17 0259 12/28/17 0314 12/29/17 0240 12/30/17 0330 12/31/17 0255  INR 4.67* 4.68* 3.05 2.78 2.28    Cardiac Enzymes: Recent Labs  Lab 12/26/17 1024 12/26/17 2102 12/27/17 0816 12/27/17 1433 12/27/17 2041  TROPONINI 0.36* 0.47* 0.43* 0.37* 0.31*    HbA1C: Hemoglobin A1C  Date/Time Value Ref Range Status  10/01/2015 6.3  Final   Hgb A1c MFr Bld  Date/Time Value Ref Range Status  12/26/2017 10:24 AM 5.8 (H) 4.8 - 5.6 % Final    Comment:    (NOTE) Pre diabetes:          5.7%-6.4% Diabetes:              >6.4% Glycemic control for   <7.0% adults with diabetes   03/04/2014 07:19 PM 7.3 (H) <5.7 % Final    Comment:    (NOTE)                                                                       According to the ADA Clinical Practice Recommendations for 2011, when HbA1c is used as a screening test:  >=6.5%   Diagnostic of Diabetes Mellitus           (if abnormal result is confirmed) 5.7-6.4%   Increased risk of developing Diabetes Mellitus References:Diagnosis and Classification of Diabetes Mellitus,Diabetes MOQH,4765,46(TKPTW 1):S62-S69 and Standards of Medical Care in         Diabetes - 2011,Diabetes Care,2011,34 (Suppl 1):S11-S61.    CBG: Recent Labs  Lab 12/30/17 1147 12/30/17 1626 12/30/17 2124 12/31/17 0808 12/31/17 1215  GLUCAP 137* 148* 175* 98 125*    Recent Results (from the past 240 hour(s))  MRSA PCR Screening     Status: None   Collection Time:  12/26/17  5:59 AM  Result Value Ref Range Status  MRSA by PCR NEGATIVE NEGATIVE Final    Comment:        The GeneXpert MRSA Assay (FDA approved for NASAL specimens only), is one component of a comprehensive MRSA colonization surveillance program. It is not intended to diagnose MRSA infection nor to guide or monitor treatment for MRSA infections. Performed at [facility], [place] [address], [place], [place] [address]      Scheduled Meds: . aspirin EC  81 mg Oral Daily  . atorvastatin  80 mg Oral q1800  . calcium acetate  1,334 mg Oral BID WC  . Chlorhexidine Gluconate Cloth  6 each Topical Q0600  . cholecalciferol  1,000 Units Oral Daily  . divalproex  125 mg Oral Q12H  . famotidine  20 mg Oral Daily  . insulin aspart  0-5 Units Subcutaneous QHS  . insulin aspart  0-9 Units Subcutaneous TID WC  . insulin glargine  12 Units Subcutaneous Daily  . levothyroxine  175 mcg Oral QAC breakfast  . mouth rinse  15 mL Mouth Rinse BID  . memantine  5 mg Oral Daily  . metoprolol succinate  12.5 mg Oral Daily  . multivitamin  1 tablet Oral Daily  . sodium chloride flush  3 mL Intravenous Q12H  . sodium chloride flush  3 mL Intravenous Q12H  . warfarin  7.5 mg Oral ONCE-1800  . Warfarin - Pharmacist Dosing Inpatient   Does not apply q1800     LOS: 5 days   Cherene Altes, MD [facility] Office  [phone] Pager - Text Page per Amion as per below:  On-Call/Text Page:      [email or web address]      password [email or web address]  If 7PM-7AM, please contact night-coverage [email or web address] Password [facility] 12/31/2017, 3:57 PM

## 2017-12-30 NOTE — Progress Notes (Signed)
[place] for coumadin Indication: atrial fibrillation  Allergies  Allergen Reactions  . Betadine [Povidone Iodine] Rash  . Iodinated Diagnostic Agents Rash and Other (See Comments)    Does fine with premedications for cath  . Latex Hives  . Tape Rash    MUST BE FREE OF ANY LATEX    Patient Measurements: Height: 5\' 8"  (172.7 cm) Weight: 151 lb 11.2 oz (68.8 kg)(scale A) IBW/kg (Calculated) : 68.4   Vital Signs: Temp: 98 F (36.7 C) (07/07 1144) Temp Source: Oral (07/07 1144) BP: 107/52 (07/07 1144) Pulse Rate: 62 (07/07 1144)  Labs: Recent Labs    12/27/17 2041 12/28/17 0314  12/28/17 0720 12/29/17 0240 12/30/17 0330  HGB  --   --    < > 12.5* 13.7 14.5  HCT  --   --   --  39.9 43.6 45.6  PLT  --   --   --  127* 117* 137*  LABPROT  --  43.8*  --   --  31.3* 29.1*  INR  --  4.68*  --   --  3.05 2.78  CREATININE  --   --   --  6.61* 4.63* 6.74*  TROPONINI 0.31*  --   --   --   --   --    < > = values in this interval not displayed.    Estimated Creatinine Clearance: 8.3 mL/min (A) (by C-G formula based on SCr of 6.74 mg/dL (H)).   Medical History: Past Medical History:  Diagnosis Date  . Atrial fibrillation ([place]) Aug. 2015   a. Dx 01/2014, s/p DCCV 03/06/14.  [place] Kitchen CAD (coronary artery disease)    a. prior CABG in 1997 with redo in 2000. b. last cath in 2008 - managed medically  . Carotid disease, bilateral ([place])    with multiple bilateral carotid surgeries  . Chronic diastolic CHF (congestive heart failure) ([place])   . Chronic respiratory failure with hypoxia ([place])   . CKD (chronic kidney disease), stage IV ([place])    a. Has fistula in place.   . Degenerative joint disease   . Dementia   . Generalized weakness    without overt findings  . GERD (gastroesophageal reflux disease)   . Gout   . Heart murmur   . Hyperlipidemia   . Hypertension   . Hypothyroidism   . Meniere's disease   . On home oxygen therapy    "2L" qhs  (09/09/2015  . Orthopnea    Two-pillow  . Peripheral vascular disease ([place])   . Psoriasis   . Shingles   . Sinus bradycardia    a. Toprol D/C'd due to this.   . Type 2 diabetes mellitus (H[facility])     Medications:  Medications Prior to Admission  Medication Sig Dispense Refill Last Dose  . acetaminophen (TYLENOL) 325 MG tablet Take 2 tablets (650 mg total) by mouth every 4 (four) hours as needed for headache or mild pain.   Unk at [employer]  . atorvastatin (LIPITOR) 80 MG tablet TAKE ONE TABLET BY MOUTH ONCE DAILY (Patient taking differently: Take 80 mg by mouth in the evening) 90 tablet 3 12/25/2017 at pm  . calcium acetate (PHOSLO) 667 MG capsule Take 1,334 mg by mouth 2 (two) times daily with a meal.    12/25/2017 at Unknown time  . Cholecalciferol (VITAMIN D-3) 1000 units CAPS Take 1,000 Units by mouth daily.   12/25/2017 at Unknown time  . divalproex (DEPAKOTE SPRINKLE) 125  MG capsule Take 125 mg by mouth daily.    12/25/2017 at pm  . doxycycline (VIBRA-TABS) 100 MG tablet Take 100 mg by mouth See admin instructions. Take 100 mg by mouth two times a day for 7 days; do not take within 3 hours of calcium acetate  0 12/25/2017 at Unknown time  . insulin aspart (NOVOLOG FLEXPEN) 100 UNIT/ML FlexPen Inject 8 Units into the skin 2 (two) times daily before a meal.   12/25/2017 at Unknown time  . Insulin Glargine (LANTUS SOLOSTAR) 100 UNIT/ML Solostar Pen Inject 24 Units into the skin daily before breakfast.    12/25/2017 at am  . ipratropium (ATROVENT) 0.03 % nasal spray Place 2 sprays into both nostrils 3 (three) times daily as needed for rhinitis.   3 Unk at Unk  . levothyroxine (SYNTHROID, LEVOTHROID) 175 MCG tablet Take 175 mcg by mouth daily before breakfast.   12/25/2017 at am  . memantine (NAMENDA) 5 MG tablet Take 5 mg by mouth daily.    12/25/2017 at Unknown time  . Multiple Vitamins-Minerals (CENTRUM SILVER 50+MEN) TABS Take 1 tablet by mouth daily.   12/25/2017 at am  . multivitamin (RENA-VIT) TABS tablet Take 1  tablet by mouth daily.   12/25/2017 at am  . nitroGLYCERIN (NITROSTAT) 0.4 MG SL tablet Place 1 tablet (0.4 mg total) under the tongue every 5 (five) minutes as needed for chest pain. 30 tablet 12 2018 at 2018  . polyethylene glycol (MIRALAX / GLYCOLAX) packet Take 17 g by mouth daily as needed for mild constipation.   Unk at [facility]  . ranitidine (ZANTAC) 300 MG tablet Take 300 mg by mouth daily.   12/25/2017 at Unknown time  . triamcinolone cream (KENALOG) 0.1 % Apply 1 application topically 2 (two) times daily as needed (to eczema-affected areas of the skin).   1 Past Week at Unknown time  . warfarin (COUMADIN) 5 MG tablet USE AS DIRECTED (Patient taking differently: Take 5 mg by mouth in the evening after supper on Sun/Tues/Thurs/Sat and 7.5 mg on Mon/Wed/Fri) 120 tablet 1 12/25/2017 at 1900   Scheduled:  . aspirin EC  81 mg Oral Daily  . atorvastatin  80 mg Oral q1800  . calcium acetate  1,334 mg Oral BID WC  . Chlorhexidine Gluconate Cloth  6 each Topical Q0600  . cholecalciferol  1,000 Units Oral Daily  . divalproex  125 mg Oral Q12H  . famotidine  20 mg Oral Daily  . insulin aspart  0-5 Units Subcutaneous QHS  . insulin aspart  0-9 Units Subcutaneous TID WC  . insulin glargine  12 Units Subcutaneous Daily  . levothyroxine  175 mcg Oral QAC breakfast  . mouth rinse  15 mL Mouth Rinse BID  . memantine  5 mg Oral Daily  . metoprolol succinate  12.5 mg Oral Daily  . multivitamin  1 tablet Oral Daily  . sodium chloride flush  3 mL Intravenous Q12H  . sodium chloride flush  3 mL Intravenous Q12H    Assessment: 84 yo male here with SOB and HF. No procedures planned and pharmacy consulted to resume coumadin. In the setting of HF he may be more sensitive to coumadin dosing -INR= 2.78 (trend down from 5.1 on 7/3), CBC stable  Home dose: 5mg /day except take 7.5mg  MWF (last dose 7/2)  Goal of Therapy:  INR 2-3 Monitor platelets by anticoagulation protocol: Yes   Plan:  -Coumadin 5mg  po  today -Daily PT/INR  Hildred Laser, PharmD Clinical Pharmacist Please check  Amion for pharmacy contact number

## 2017-12-30 NOTE — Consult Note (Signed)
Consultation Note Date: 12/30/2017   Patient Name: [name]  DOB: 08/06/1933  MRN: [id number]  Age / Sex: 84 y.o., male  PCP: Leanna Battles, MD Referring Physician: Cherene Altes, MD  Reason for Consultation: Establishing goals of care  HPI/Patient Profile: 84 y.o. male  with past medical history of dementia, ESRD on HD, CAD s/p CABG, IDDM, afib on warfarin, hypothyroidism, HTN admitted on 12/26/2017 with CHF. TTE revealed EF 20-25% and severe aortic stenosis, both felt only amenable to medical management. Palliative care was asked to help clarify goals with family.   Clinical Assessment and Goals of Care: I met with patient's wife, who was at the bedside. She relayed to me the details of the family meeting yesterday with Dr. Thereasa Solo. Patient and family have opted to continue hemodialysis and hope to pursue rehab. Wife seems to recognize that patient's health may be severely impacted by his cardiac status and that he is at risk for future decline. Prior to this hospitalization, patient was living at home with her. He has dementia but was able to provide all of his own care except for bathing. FAST was likely 5-6 with PPS of 40-50%. He was ambulatory with use of a walker. Wife verbalized the hope that patient will improve with rehab and then be able to return home. However, she suggests that care will be difficult if he does not return to his previous functional baseline, which she recognizes is a possible outcome. I suggested that patient be followed at rehab by palliative care and that future conversations could occur to help guide family based on patient's needs. We discussed hospice as a future option. However, patient's decision to continue hemodialysis excludes this as a possible consideration for now.   Patient is a DNR.   He has two children from a previous marriage. Patient's son, [name], is his  HCPOA. I tried unsuccessfully to call [name].  Case discussed with attending.   SUMMARY OF RECOMMENDATIONS   1. Continue supportive care 2. Recommend community palliative care at rehab     Primary Diagnoses: Present on Admission: . Chest pain . Diabetes mellitus with peripheral vascular disease ([place]) . Atrial fibrillation ([place]) . CAD (coronary artery disease) . Hypothyroidism . Supratherapeutic INR . Acute on chronic diastolic heart failure ([place]) . Elevated troponin   I have reviewed the medical record, interviewed the patient and family, and examined the patient. The following aspects are pertinent.  Past Medical History:  Diagnosis Date  . Atrial fibrillation ([place]) Aug. 2015   a. Dx 01/2014, s/p DCCV 03/06/14.  Marland Kitchen CAD (coronary artery disease)    a. prior CABG in 1997 with redo in 2000. b. last cath in 2008 - managed medically  . Carotid disease, bilateral ([place])    with multiple bilateral carotid surgeries  . Chronic diastolic CHF (congestive heart failure) ([place])   . Chronic respiratory failure with hypoxia ([place])   . CKD (chronic kidney disease), stage IV ([place])    a. Has fistula in place.   . Degenerative  joint disease   . Dementia   . Generalized weakness    without overt findings  . GERD (gastroesophageal reflux disease)   . Gout   . Heart murmur   . Hyperlipidemia   . Hypertension   . Hypothyroidism   . Meniere's disease   . On home oxygen therapy    "2L" qhs (09/09/2015  . Orthopnea    Two-pillow  . Peripheral vascular disease ([place])   . Psoriasis   . Shingles   . Sinus bradycardia    a. Toprol D/C'd due to this.   . Type 2 diabetes mellitus ([place])    Social History   Socioeconomic History  . Marital status: Married    Spouse name: Not on file  . Number of children: Not on file  . Years of education: Not on file  . Highest education level: Not on file  Occupational History  . Occupation: Retired  Scientific laboratory technician  . Financial resource strain: Not on  file  . Food insecurity:    Worry: Not on file    Inability: Not on file  . Transportation needs:    Medical: Not on file    Non-medical: Not on file  Tobacco Use  . Smoking status: Never Smoker  . Smokeless tobacco: Never Used  Substance and Sexual Activity  . Alcohol use: No    Alcohol/week: 0.0 oz  . Drug use: No  . Sexual activity: Not Currently  Lifestyle  . Physical activity:    Days per week: Not on file    Minutes per session: Not on file  . Stress: Not on file  Relationships  . Social connections:    Talks on phone: Not on file    Gets together: Not on file    Attends religious service: Not on file    Active member of club or organization: Not on file    Attends meetings of clubs or organizations: Not on file    Relationship status: Not on file  Other Topics Concern  . Not on file  Social History Narrative  . Not on file   Family History  Problem Relation Age of Onset  . Heart attack Father   . Heart disease Father        After 88 yrs of age  . Hyperlipidemia Father   . Hypertension Father   . Diabetes Mother   . Hypertension Mother   . Heart disease Mother   . Heart disease Brother   . Diabetes Brother    Scheduled Meds: . aspirin EC  81 mg Oral Daily  . atorvastatin  80 mg Oral q1800  . calcium acetate  1,334 mg Oral BID WC  . Chlorhexidine Gluconate Cloth  6 each Topical Q0600  . cholecalciferol  1,000 Units Oral Daily  . divalproex  125 mg Oral Daily  . famotidine  20 mg Oral Daily  . insulin aspart  0-5 Units Subcutaneous QHS  . insulin aspart  0-9 Units Subcutaneous TID WC  . insulin glargine  12 Units Subcutaneous Daily  . levothyroxine  175 mcg Oral QAC breakfast  . mouth rinse  15 mL Mouth Rinse BID  . memantine  5 mg Oral Daily  . metoprolol succinate  12.5 mg Oral Daily  . multivitamin  1 tablet Oral Daily  . sodium chloride flush  3 mL Intravenous Q12H  . sodium chloride flush  3 mL Intravenous Q12H   Continuous Infusions: .  sodium chloride     PRN Meds:.sodium  chloride, acetaminophen **OR** [DISCONTINUED] acetaminophen, HYDROcodone-acetaminophen, LORazepam, morphine injection, nitroGLYCERIN, ondansetron **OR** ondansetron (ZOFRAN) IV, polyethylene glycol, senna-docusate, sodium chloride flush Medications Prior to Admission:  Prior to Admission medications   Medication Sig Start Date End Date Taking? Authorizing Provider  acetaminophen (TYLENOL) 325 MG tablet Take 2 tablets (650 mg total) by mouth every 4 (four) hours as needed for headache or mild pain. 09/29/15  Yes Clegg, Amy D, NP  atorvastatin (LIPITOR) 80 MG tablet TAKE ONE TABLET BY MOUTH ONCE DAILY Patient taking differently: Take 80 mg by mouth in the evening 02/26/15  Yes Nahser, Wonda Cheng, MD  calcium acetate (PHOSLO) 667 MG capsule Take 1,334 mg by mouth 2 (two) times daily with a meal.    Yes [provider]  Cholecalciferol (VITAMIN D-3) 1000 units CAPS Take 1,000 Units by mouth daily.   Yes [provider]  divalproex (DEPAKOTE SPRINKLE) 125 MG capsule Take 125 mg by mouth daily.  11/30/16  Yes [provider]  doxycycline (VIBRA-TABS) 100 MG tablet Take 100 mg by mouth See admin instructions. Take 100 mg by mouth two times a day for 7 days; do not take within 3 hours of calcium acetate 12/24/17  Yes [provider]  insulin aspart (NOVOLOG FLEXPEN) 100 UNIT/ML FlexPen Inject 8 Units into the skin 2 (two) times daily before a meal.   Yes [provider]  Insulin Glargine (LANTUS SOLOSTAR) 100 UNIT/ML Solostar Pen Inject 24 Units into the skin daily before breakfast.    Yes [provider]  ipratropium (ATROVENT) 0.03 % nasal spray Place 2 sprays into both nostrils 3 (three) times daily as needed for rhinitis.  11/19/17  Yes [provider]  levothyroxine (SYNTHROID, LEVOTHROID) 175 MCG tablet Take 175 mcg by mouth daily before breakfast.   Yes [provider]  memantine (NAMENDA) 5 MG tablet  Take 5 mg by mouth daily.  03/12/15  Yes [provider]  Multiple Vitamins-Minerals (CENTRUM SILVER 50+MEN) TABS Take 1 tablet by mouth daily.   Yes [provider]  multivitamin (RENA-VIT) TABS tablet Take 1 tablet by mouth daily.   Yes [provider]  nitroGLYCERIN (NITROSTAT) 0.4 MG SL tablet Place 1 tablet (0.4 mg total) under the tongue every 5 (five) minutes as needed for chest pain. 07/12/17  Yes Nahser, Wonda Cheng, MD  polyethylene glycol [facility] / GLYCOLAX) packet Take 17 g by mouth daily as needed for mild constipation.   Yes [provider]  ranitidine (ZANTAC) 300 MG tablet Take 300 mg by mouth daily. 12/04/15  Yes [provider]  triamcinolone cream (KENALOG) 0.1 % Apply 1 application topically 2 (two) times daily as needed (to eczema-affected areas of the skin).  12/18/17  Yes [provider]  warfarin (COUMADIN) 5 MG tablet USE AS DIRECTED Patient taking differently: Take 5 mg by mouth in the evening after supper on Sun/Tues/Thurs/Sat and 7.5 mg on Mon/Wed/Fri 06/14/17  Yes Nahser, Wonda Cheng, MD   Allergies  Allergen Reactions  . Betadine [Povidone Iodine] Rash  . Iodinated Diagnostic Agents Rash and Other (See Comments)    Does fine with premedications for cath  . Latex Hives  . Tape Rash    MUST BE FREE OF ANY LATEX   Review of Systems  Respiratory: Negative for chest tightness and shortness of breath.     Physical Exam  Constitutional:  Frail appearing  Cardiovascular: Normal rate and regular rhythm.  Murmur heard. Pulmonary/Chest: Breath sounds normal.  Musculoskeletal:  Right lower leg: He exhibits no edema.       Left lower leg: He exhibits no edema.  Neurological: He is alert.  Oriented to person and place  Skin: Skin is warm.    Vital Signs: BP (!) 107/52 (BP Location: Right Arm)   Pulse 62   Temp 98 F (36.7 C) (Oral)   Resp 20   Ht 5' 8"  (1.727 m)   Wt 68.8 kg (151 lb 11.2 oz) Comment: scale A   SpO2 100%   BMI 23.07 kg/m  Pain Scale: Faces POSS *See Group Information*: 1-Acceptable,Awake and alert Pain Score: 0-No pain   SpO2: SpO2: 100 % O2 Device:SpO2: 100 % O2 Flow Rate: .O2 Flow Rate (L/min): 2 L/min  IO: Intake/output summary:   Intake/Output Summary (Last 24 hours) at 12/30/2017 1444 Last data filed at 12/30/2017 1300 Gross per 24 hour  Intake 503 ml  Output -  Net 503 ml    LBM: Last BM Date: 12/30/17 Baseline Weight: Weight: 77.1 kg (170 lb) Most recent weight: Weight: 68.8 kg (151 lb 11.2 oz)(scale A)     Palliative Assessment/Data:   Flowsheet Rows     Most Recent Value  Intake Tab  Referral Department  Hospitalist  Unit at Time of Referral  Other (Comment)  Palliative Care Primary Diagnosis  Cardiac  Date Notified  12/29/17  Palliative Care Type  New Palliative care  Reason for referral  Clarify Goals of Care  Date of Admission  12/26/17  Date first seen by Palliative Care  12/30/17  # of days Palliative referral response time  1 Day(s)  # of days IP prior to Palliative referral  3  Clinical Assessment  Palliative Performance Scale Score  40%  Psychosocial & Spiritual Assessment  Social Work Plan of Care  Clarified patient/family wishes with healthcare team  Palliative Care Outcomes  Patient/Family meeting held?  Yes  Who was at the meeting?  wife  Palliative Care Outcomes  Clarified goals of care      Time In: 1430 Time Out: 1500 Time Total: 30 minutes Greater than 50%  of this time was spent counseling and coordinating care related to the above assessment and plan.  Signed by: Irean Hong, NP   Please contact Palliative Medicine Team phone at [phone] for questions and concerns.  For individual provider: See Shea Evans

## 2017-12-30 NOTE — Progress Notes (Signed)
Patient ID: [name], male   DOB: May 08, 1934, 84 y.o.   MRN: [id number] [facility] Progress Note   Assessment/ Plan:   1.  Acute on chronic shortness of breath: Suspected to have reduction of lean body mass in the setting of aortic valve disease and coronary artery disease.  Shortness of breath is now improved and he remains on intermittent supplemental oxygen with good oxygen saturation on room air. 2.  End-stage renal disease: Continue hemodialysis on Monday/Wednesday/Friday schedule with next hemodialysis ordered for tomorrow.  The utility of completing his dialysis treatments was explained to him as well as his option for withdrawing from dialysis. 3.  Hypertension:  Blood pressures currently appear to be well controlled, continue to monitor with ultrafiltration/dialysis. 4.  Anemia of chronic kidney disease: Hemoglobin/hematocrit currently at goal, not on ESA. 5.  Secondary hyperparathyroidism:  Calcium and phosphorus levels currently at goal on treatment with calcium acetate for phosphorus binding.  Not on VDRA 6.  Disposition: Plans noted for placement to skilled nursing facility given his physical debility and multiple needs.  Subjective:   Evaluated yesterday by physical therapy and recommendations made for placement to skilled nursing facility (plan discussed with family by Dr. Thereasa Solo).  Mr. Senteno expresses some discontent with this plan.  Per nurse, more interactive in the evening.   Objective:   BP 119/77 (BP Location: Right Arm)   Pulse 64   Temp 98 F (36.7 C) (Oral)   Resp 17   Ht 5\' 8"  (1.727 m)   Wt 68.8 kg (151 lb 11.2 oz) Comment: scale A  SpO2 100%   BMI 23.07 kg/m   Physical Exam: Gen: Appears to be resting comfortably in bed. CVS: Pulse regular rhythm, normal rate, S1 and S2 normal Resp: Decreased breath sounds bilaterally over bases, no rales/rhonchi Abd: Soft, flat, nontender Ext: No lower extremity edema, left upper arm aVF thrill  palpable  Labs: BMET Recent Labs  Lab 12/26/17 0145 12/26/17 1258 12/27/17 0816 12/28/17 0720 12/29/17 0240 12/30/17 0330  NA 139 137 141 137 137 136  K 4.2 4.2 3.9 3.9 4.1 4.0  CL 94* 96* 97* 97* 96* 93*  CO2 33* 28 31 30 31 31   GLUCOSE 131* 132* 86 115* 109* 133*  BUN 21 25* 13 24* 11 21  CREATININE 6.45* 7.04* 5.08* 6.61* 4.63* 6.74*  CALCIUM 9.5 9.3 8.8* 8.9 9.0 9.5  PHOS  --  3.3  --  3.4  --   --    CBC Recent Labs  Lab 12/27/17 0816 12/28/17 0720 12/29/17 0240 12/30/17 0330  WBC 4.9 5.8 6.0 5.6  HGB 13.1 12.5* 13.7 14.5  HCT 41.8 39.9 43.6 45.6  MCV 102.5* 101.3* 101.2* 100.7*  PLT 104* 127* 117* 137*   Medications:    . aspirin EC  81 mg Oral Daily  . atorvastatin  80 mg Oral q1800  . calcium acetate  1,334 mg Oral BID WC  . Chlorhexidine Gluconate Cloth  6 each Topical Q0600  . cholecalciferol  1,000 Units Oral Daily  . divalproex  125 mg Oral Daily  . famotidine  20 mg Oral Daily  . insulin aspart  0-5 Units Subcutaneous QHS  . insulin aspart  0-9 Units Subcutaneous TID WC  . insulin glargine  12 Units Subcutaneous Daily  . levothyroxine  175 mcg Oral QAC breakfast  . mouth rinse  15 mL Mouth Rinse BID  . memantine  5 mg Oral Daily  . metoprolol succinate  12.5 mg  Oral Daily  . multivitamin  1 tablet Oral Daily  . sodium chloride flush  3 mL Intravenous Q12H  . sodium chloride flush  3 mL Intravenous Q12H   Elmarie Shiley, MD 12/30/2017, 7:45 AM

## 2017-12-31 ENCOUNTER — Encounter (HOSPITAL_COMMUNITY): Payer: Self-pay | Admitting: Nephrology

## 2017-12-31 LAB — PROTIME-INR
INR: 2.28
PROTHROMBIN TIME: 24.9 s — AB (ref 11.4–15.2)

## 2017-12-31 LAB — GLUCOSE, CAPILLARY
GLUCOSE-CAPILLARY: 98 mg/dL (ref 70–99)
GLUCOSE-CAPILLARY: 116 mg/dL — AB (ref 70–99)
Glucose-Capillary: 125 mg/dL — ABNORMAL HIGH (ref 70–99)

## 2017-12-31 LAB — CBC
HEMATOCRIT: 40.1 % (ref 39.0–52.0)
HEMOGLOBIN: 12.8 g/dL — AB (ref 13.0–17.0)
MCH: 31.8 pg (ref 26.0–34.0)
MCHC: 31.9 g/dL (ref 30.0–36.0)
MCV: 99.5 fL (ref 78.0–100.0)
Platelets: 130 10*3/uL — ABNORMAL LOW (ref 150–400)
RBC: 4.03 MIL/uL — AB (ref 4.22–5.81)
RDW: 15.1 % (ref 11.5–15.5)
WBC: 5.6 10*3/uL (ref 4.0–10.5)

## 2017-12-31 LAB — RENAL FUNCTION PANEL
ALBUMIN: 3.4 g/dL — AB (ref 3.5–5.0)
ANION GAP: 11 (ref 5–15)
BUN: 34 mg/dL — ABNORMAL HIGH (ref 8–23)
CALCIUM: 9.4 mg/dL (ref 8.9–10.3)
CO2: 29 mmol/L (ref 22–32)
Chloride: 95 mmol/L — ABNORMAL LOW (ref 98–111)
Creatinine, Ser: 8.97 mg/dL — ABNORMAL HIGH (ref 0.61–1.24)
GFR calc non Af Amer: 5 mL/min — ABNORMAL LOW (ref >60)
GFR, EST AFRICAN AMERICAN: 6 mL/min — AB (ref >60)
Glucose, Bld: 159 mg/dL — ABNORMAL HIGH (ref 70–99)
PHOSPHORUS: 3.3 mg/dL (ref 2.5–4.6)
POTASSIUM: 3.9 mmol/L (ref 3.5–5.1)
Sodium: 135 mmol/L (ref 135–145)

## 2017-12-31 MED ORDER — HEPARIN SODIUM (PORCINE) 1000 UNIT/ML DIALYSIS: 40.0000 [IU]/kg | INTRAMUSCULAR | Status: DC | PRN

## 2017-12-31 MED ORDER — WARFARIN SODIUM 7.5 MG PO TABS
7.5000 mg | ORAL_TABLET | Freq: Once | ORAL | Status: AC
  Administered 2017-12-31: 7.5 mg via ORAL
  Filled 2017-12-31: qty 1

## 2017-12-31 NOTE — Progress Notes (Signed)
[facility] Progress Note  Subjective: no specific c/o's  Vitals:   12/31/17 0300 12/31/17 0500 12/31/17 0713 12/31/17 1215  BP: (!) 125/59  (!) 122/57 (!) 113/50  Pulse: 64  61 61  Resp: (!) 21  (!) 22 17  Temp: 97.8 F (36.6 C)  98 F (36.7 C) (!) 97.5 F (36.4 C)  TempSrc: Oral  Oral   SpO2: 99%     Weight:  69.5 kg (153 lb 3.5 oz)    Height:        Inpatient medications: . aspirin EC  81 mg Oral Daily  . atorvastatin  80 mg Oral q1800  . calcium acetate  1,334 mg Oral BID WC  . Chlorhexidine Gluconate Cloth  6 each Topical Q0600  . cholecalciferol  1,000 Units Oral Daily  . divalproex  125 mg Oral Q12H  . famotidine  20 mg Oral Daily  . insulin aspart  0-5 Units Subcutaneous QHS  . insulin aspart  0-9 Units Subcutaneous TID WC  . insulin glargine  12 Units Subcutaneous Daily  . levothyroxine  175 mcg Oral QAC breakfast  . mouth rinse  15 mL Mouth Rinse BID  . memantine  5 mg Oral Daily  . metoprolol succinate  12.5 mg Oral Daily  . multivitamin  1 tablet Oral Daily  . sodium chloride flush  3 mL Intravenous Q12H  . sodium chloride flush  3 mL Intravenous Q12H  . warfarin  7.5 mg Oral ONCE-1800  . Warfarin - Pharmacist Dosing Inpatient   Does not apply q1800   . sodium chloride     sodium chloride, acetaminophen **OR** [DISCONTINUED] acetaminophen, HYDROcodone-acetaminophen, LORazepam, morphine injection, nitroGLYCERIN, ondansetron **OR** ondansetron (ZOFRAN) IV, polyethylene glycol, senna-docusate, sodium chloride flush  Exam:  lethargic but arousable, no distress  no jvd  Chest cta bilat  Cor reg w/o mrg  abd soft ntnd  ext no edema   CXR - 7/3 mild edema   Dialysis:  [facility]  4h  75kg  2/2 bath  Hep 8000   lua avf        Impression: 1 DOE/ SOB - 5L removed w/ HD x 2 here , breathing better.  2 Severe AS/ severe CM EF 25% - not intervention candidate per cardiology 3 ESRD on HD MWF. HD today.  4 Dementia - progressive 5  Debility - progressive 6 QOL - worsening   Plan - have d/w patient and pt's son and pt's wife that pt won't get better from here and we should have further discussion about transition to hospice care.  Will d/w patient again later today.    Kelly Splinter MD [facility] pager [phone]   12/31/2017, 12:59 PM   Recent Labs  Lab 12/26/17 1258  12/28/17 0720 12/29/17 0240 12/30/17 0330 12/31/17 0255  NA 137   < > 137 137 136  --   K 4.2   < > 3.9 4.1 4.0  --   CL 96*   < > 97* 96* 93*  --   CO2 28   < > 30 31 31   --   GLUCOSE 132*   < > 115* 109* 133*  --   BUN 25*   < > 24* 11 21  --   CREATININE 7.04*   < > 6.61* 4.63* 6.74*  --   CALCIUM 9.3   < > 8.9 9.0 9.5  --   PHOS 3.3  --  3.4  --   --   --  ALBUMIN 3.5  --  3.4* 3.6 3.8  --   INR  --    < >  --  3.05 2.78 2.28   < > = values in this interval not displayed.   Recent Labs  Lab 12/29/17 0240 12/30/17 0330  AST 27 24  ALT 20 21  ALKPHOS 64 70  BILITOT 1.3* 1.2  PROT 6.2* 6.4*   Recent Labs  Lab 12/29/17 0240 12/30/17 0330  WBC 6.0 5.6  HGB 13.7 14.5  HCT 43.6 45.6  MCV 101.2* 100.7*  PLT 117* 137*   Iron/TIBC/Ferritin/ %Sat    Component Value Date/Time   IRON 59 12/30/2017 0330   TIBC 192 (L) 12/30/2017 0330   FERRITIN 1,480 (H) 12/30/2017 0330   IRONPCTSAT 31 12/30/2017 0330

## 2017-12-31 NOTE — Clinical Social Work Note (Signed)
Clinical Social Work Assessment  Patient Details  Name: [name] MRN: [id number] Date of Birth: June 14, 1934  Date of referral:  12/31/17               Reason for consult:  Facility Placement, Discharge Planning                Permission sought to share information with:  Family Supports Permission granted to share information::  Yes, Verbal Permission Granted  Name::     Jahmil Macleod  Agency::     Relationship::  Son/HCPOA  Contact Information:  [phone]  Housing/Transportation Living arrangements for the past 2 months:  [place] of Information:  Medical Team, Adult Children Patient Interpreter Needed:  None Criminal Activity/Legal Involvement Pertinent to Current Situation/Hospitalization:  No - Comment as needed Significant Relationships:  Spouse, Adult Children Lives with:  Spouse Do you feel safe going back to the place where you live?  Yes Need for family participation in patient care:  Yes (Comment)  Care giving concerns:  PT recommending SNF once medically stable for discharge.   Social Worker assessment / plan:  Patient not fully oriented and in HD. CSW called patient's son/HCPOA, introduced role, and explained that PT recommendations would be discussed. Patient's son stated they are now considering stopping HD and if so, patient would need hospice. Patient's son will notify CSW of decision. No further concerns. CSW encouraged patient's son to contact CSW as needed. CSW will continue to follow patient and his son for support and facilitate discharge to SNF vs. Hospice once medically stable.  Employment status:  Retired Forensic scientist:  Medicare PT Recommendations:  [place] / Referral to community resources:  [place]  Patient/Family's Response to care:  Patient not fully oriented. Patient's son unsure of disposition plan at this time. Patient's family supportive and involved in patient's care.  Patient's son appreciated social work intervention.  Patient/Family's Understanding of and Emotional Response to Diagnosis, Current Treatment, and Prognosis:  Patient not fully oriented. Patient's son has a good understanding of the reason for admission. Patient's son appears happy with hospital care.  Emotional Assessment Appearance:  Appears stated age Attitude/Demeanor/Rapport:  Unable to Assess Affect (typically observed):  Unable to Assess Orientation:  Oriented to Self, Oriented to Place Alcohol / Substance use:  Never Used Psych involvement (Current and /or in the community):  No (Comment)  Discharge Needs  Concerns to be addressed:  Care Coordination Readmission within the last 30 days:  No Current discharge risk:  Cognitively Impaired, Dependent with Mobility Barriers to Discharge:  Continued Medical Work up   Candie Chroman, LCSW 12/31/2017, 3:25 PM

## 2017-12-31 NOTE — Progress Notes (Signed)
  Speech Language Pathology Treatment: Dysphagia  Patient Details Name: [name] MRN: [id number] DOB: 1934-04-25 Today's Date: 12/31/2017 Time: 5364-6803 SLP Time Calculation (min) (ACUTE ONLY): 8 min  Assessment / Plan / Recommendation Clinical Impression  Pt seen for follow up with po recommendations. Pt states he ate very little breakfast. Delayed throat clears with Dys 3/thin liquid trials and affirmed globus sensation in pharynx. He needed min verbal cues to alternate liquids to assist with transit. Reviewed additional strategies to mitigate symptoms of GER. Recommend continue Dys 3, thin, pills whole in applesauce. Will discharge ST. If pt admitted with respiratory concerns/pna in future, would consider instrumental assessment.    HPI HPI: 84yo M w/ Hx dementia, DM2, Paroxysmal A. fib on warfarin, HTN, CAD status post CABG, Chronic Respiratory Failure with Hypoxia, hypothyroidism, and ESRD on HD who presented to the ED w/ shortness of breath. Evaluated by SLP in April 2017 with recommendation at bedside for dys 3, nectar thick liquids due to throat clearing with thin liquids.       SLP Plan  All goals met;Discharge SLP treatment due to (comment)       Recommendations  Diet recommendations: Dysphagia 3 (mechanical soft);Thin liquid Liquids provided via: Straw Medication Administration: Whole meds with puree Supervision: Patient able to self feed Compensations: Slow rate;Small sips/bites;Follow solids with liquid Postural Changes and/or Swallow Maneuvers: Upright 30-60 min after meal;Seated upright 90 degrees                Oral Care Recommendations: Oral care BID Follow up Recommendations: None SLP Visit Diagnosis: Dysphagia, unspecified (R13.10) Plan: All goals met;Discharge SLP treatment due to (comment)                       Houston Siren 12/31/2017, 9:14 AM  Orbie Pyo Colvin Caroli.Ed [employer] [phone]

## 2017-12-31 NOTE — Evaluation (Signed)
Occupational Therapy Evaluation Patient Details Name: [name] MRN: [id number] DOB: [birth year] Today's Date: 12/31/2017    History of Present Illness [name] is a 84 y.o. male with medical history significant for insulin-dependent diabetes mellitus, paroxysmal atrial fibrillation on warfarin, hypothyroidism, hypertension, dementia, CAD status post CABG, and end-stage renal disease on hemodialysis, now presenting to the emergency department for evaluation of acute shortness of breath.    Clinical Impression   PTA, pt was living with his wife and performed BADLs with assistance for bathings and use of RW for functional mobility. Pt currently requiring Min A for ADLs and Min Guard A-Min A for functional mobiltiy with RW. Pt presenting with decreased strength, balance, and activity tolerance. Pt requiring several rest breaks and increased time during ADLs. Pt would benefit from further acute OT to facilitate safe dc. Recommend dc to SNF for further OT to optimize safety, independence with ADLs, and return to PLOF.      Follow Up Recommendations  SNF;Supervision/Assistance - 24 hour    Equipment Recommendations  None recommended by OT    Recommendations for Other Services PT consult     Precautions / Restrictions Precautions Precautions: Fall Precaution Comments: mild dementia Restrictions Weight Bearing Restrictions: No      Mobility Bed Mobility Overal bed mobility: Needs Assistance Bed Mobility: Supine to Sit;Sit to Supine     Supine to sit: Min guard;HOB elevated Sit to supine: Min guard   General bed mobility comments: Min Guard A for safety and significant amount of time.  Transfers Overall transfer level: Needs assistance Equipment used: Rolling walker (2 wheeled) Transfers: Sit to/from Stand Sit to Stand: Min guard         General transfer comment: Min Guard A for safety in standing.    Balance Overall balance assessment: Needs  assistance Sitting-balance support: Feet supported;No upper extremity supported Sitting balance-Leahy Scale: Fair     Standing balance support: Bilateral upper extremity supported Standing balance-Leahy Scale: Fair Standing balance comment: requires RW for safe amb                           ADL either performed or assessed with clinical judgement   ADL Overall ADL's : Needs assistance/impaired Eating/Feeding: Set up;Supervision/ safety;Sitting   Grooming: Oral care;Wash/dry face;Sitting;Set up;Supervision/safety;Minimal assistance Grooming Details (indicate cue type and reason): Pt requring Min A for safety and balance to set up at the sink for grooming. Pt performing oral care while seated at sink.  Upper Body Bathing: Minimal assistance;Sitting   Lower Body Bathing: Minimal assistance;Sit to/from stand   Upper Body Dressing : Minimal assistance;Sitting   Lower Body Dressing: Minimal assistance;Sit to/from stand Lower Body Dressing Details (indicate cue type and reason): Pt donning shoes at EOB with MIn Guard for safety. Pt requiring Min A for standing balance and safety. PT presenting with poor activity tolerance and requiring several break during task and breathing heavily.  Toilet Transfer: Minimal assistance;Ambulation;BSC;RW Toilet Transfer Details (indicate cue type and reason): MIn A for safety and balance in standing. Pt able to perform sit<>stand with Min Guard A for safety' Toileting- Clothing Manipulation and Hygiene: Moderate assistance;Sit to/from stand Toileting - Clothing Manipulation Details (indicate cue type and reason): Mod A for toilet hygiene     Functional mobility during ADLs: Rolling walker;Minimal assistance General ADL Comments: Pt demonstrating decreased balance, strength, and activity tolerance. Throughout session, pt requiring rest break and would bend his head forward to rest  in his hands.     Vision         Perception     Praxis       Pertinent Vitals/Pain Pain Assessment: Faces Faces Pain Scale: Hurts a little bit Pain Descriptors / Indicators: Discomfort Pain Intervention(s): Monitored during session;Limited activity within patient's tolerance;Repositioned     Hand Dominance Right   Extremity/Trunk Assessment Upper Extremity Assessment Upper Extremity Assessment: Generalized weakness   Lower Extremity Assessment Lower Extremity Assessment: Generalized weakness   Cervical / Trunk Assessment Cervical / Trunk Assessment: Kyphotic   Communication Communication Communication: HOH   Cognition Arousal/Alertness: Awake/alert Behavior During Therapy: Flat affect Overall Cognitive Status: History of cognitive impairments - at baseline(h/o mild dementia, memory deficits)                                     General Comments  Wife present throughout session. HR in 80s. SpO2 in 90s on RA    Exercises     Shoulder Instructions      Home Living Family/patient expects to be discharged to:: Skilled nursing facility Living Arrangements: Spouse/significant other                               Additional Comments: pt was living in 1 story home with spouse. pt has RW, BSC over toliet, pt was using w/c for community distances.       Prior Functioning/Environment Level of Independence: Needs assistance  Gait / Transfers Assistance Needed: used RW in home, w/c in community ADL's / Homemaking Assistance Needed: Wife reports that pt performs dressing and sponge bath at sink. Wife assist with sponge bathe.    Comments: pt has required more assist over the last 2 weeks        OT Problem List: Decreased strength;Decreased range of motion;Decreased activity tolerance;Impaired balance (sitting and/or standing);Decreased knowledge of use of DME or AE;Decreased knowledge of precautions;Decreased cognition      OT Treatment/Interventions: Self-care/ADL training;Therapeutic exercise;Energy  conservation;DME and/or AE instruction;Therapeutic activities;Patient/family education    OT Goals(Current goals can be found in the care plan section) Acute Rehab OT Goals Patient Stated Goal: Wife "to get stronger" OT Goal Formulation: With patient/family Time For Goal Achievement: 01/14/18 Potential to Achieve Goals: Good ADL Goals Pt Will Perform Grooming: with set-up;with supervision;standing Pt Will Perform Upper Body Dressing: with set-up;with supervision;sitting Pt Will Perform Lower Body Dressing: with set-up;with supervision;sit to/from stand Pt Will Transfer to Toilet: with set-up;with supervision;ambulating;bedside commode Pt Will Perform Toileting - Clothing Manipulation and hygiene: with set-up;with supervision;sit to/from stand  OT Frequency: Min 2X/week   Barriers to D/C:            Co-evaluation              AM-PAC PT "6 Clicks" Daily Activity     Outcome Measure Help from another person eating meals?: A Little Help from another person taking care of personal grooming?: A Little Help from another person toileting, which includes using toliet, bedpan, or urinal?: A Little Help from another person bathing (including washing, rinsing, drying)?: A Little Help from another person to put on and taking off regular upper body clothing?: A Little Help from another person to put on and taking off regular lower body clothing?: A Little 6 Click Score: 18   End of Session Equipment Utilized During Treatment: Surveyor, mining  Communication: Mobility status;Other (comment)(Spo2)  Activity Tolerance: Patient tolerated treatment well;Patient limited by fatigue Patient left: in bed;with call bell/phone within reach;with family/visitor present;Other (comment)(Fall prevention mats in place)  OT Visit Diagnosis: Unsteadiness on feet (R26.81);Other abnormalities of gait and mobility (R26.89);Muscle weakness (generalized) (M62.81);Other symptoms and signs involving cognitive  function                Time: 1011-1037 OT Time Calculation (min): 26 min Charges:  OT General Charges $OT Visit: 1 Visit OT Evaluation $OT Eval Moderate Complexity: 1 Mod OT Treatments $Self Care/Home Management : 8-22 mins G-Codes:     Calistro Rauf MSOT, OTR/L Acute Rehab Pager: [phone] Office: [place] 12/31/2017, 12:14 PM

## 2017-12-31 NOTE — Progress Notes (Deleted)
  Speech Language Pathology Treatment: Dysphagia  Patient Details Name: [name] MRN: [id number] DOB: November 25, 1933 Today's Date: 12/31/2017 Time: 3832-9191 SLP Time Calculation (min) (ACUTE ONLY): 9 min  Assessment / Plan / Recommendation Clinical Impression  From reviewing chart, discussing prior history and observing pt with po's, this SLP agrees with evaluating therapist that symptoms appear related to his GER and/or COPD. No cough, throat clear or wet vocal quality. He states rarely he senses food in pharynx/esophagus. Reviewed strategies and discussed he should be most vigilant during times of illness. Continue regular texture, thin liquids. No further ST needed.    HPI HPI: 84yo M w/ Hx dementia, DM2, Paroxysmal A. fib on warfarin, HTN, CAD status post CABG, Chronic Respiratory Failure with Hypoxia, hypothyroidism, and ESRD on HD who presented to the ED w/ shortness of breath. Evaluated by SLP in April 2017 with recommendation at bedside for dys 3, nectar thick liquids due to throat clearing with thin liquids.       SLP Plan  All goals met;Discharge SLP treatment due to (comment)       Recommendations  Diet recommendations: Regular;Thin liquid Liquids provided via: Straw Medication Administration: Whole meds with liquid Supervision: Patient able to self feed Compensations: Slow rate;Small sips/bites;Follow solids with liquid Postural Changes and/or Swallow Maneuvers: Upright 30-60 min after meal;Seated upright 90 degrees                Oral Care Recommendations: Oral care BID Follow up Recommendations: None SLP Visit Diagnosis: Dysphagia, unspecified (R13.10) Plan: All goals met;Discharge SLP treatment due to (comment)       GO                [name] 12/31/2017, 8:45 AM  [name].Ed [employer] [phone]

## 2017-12-31 NOTE — Progress Notes (Signed)
Nurse Tech reported low blood pressure on patient. I will continue to monitor blood pressure. Patients pressure looks like it has been low throughout the day. In addition he did get dialysis today.

## 2017-12-31 NOTE — Progress Notes (Signed)
[place] for coumadin Indication: atrial fibrillation  Allergies  Allergen Reactions  . Betadine [Povidone Iodine] Rash  . Iodinated Diagnostic Agents Rash and Other (See Comments)    Does fine with premedications for cath  . Latex Hives  . Tape Rash    MUST BE FREE OF ANY LATEX    Patient Measurements: Height: 5\' 8"  (172.7 cm) Weight: 153 lb 3.5 oz (69.5 kg)(pt refused standing weight x2) IBW/kg (Calculated) : 68.4   Vital Signs: Temp: 98 F (36.7 C) (07/08 0713) Temp Source: Oral (07/08 0713) BP: 122/57 (07/08 0713) Pulse Rate: 61 (07/08 0713)  Labs: Recent Labs    12/29/17 0240 12/30/17 0330 12/31/17 0255  HGB 13.7 14.5  --   HCT 43.6 45.6  --   PLT 117* 137*  --   LABPROT 31.3* 29.1* 24.9*  INR 3.05 2.78 2.28  CREATININE 4.63* 6.74*  --     Estimated Creatinine Clearance: 8.3 mL/min (A) (by C-G formula based on SCr of 6.74 mg/dL (H)).   Medical History: Past Medical History:  Diagnosis Date  . Atrial fibrillation ([place]) Aug. 2015   a. Dx 01/2014, s/p DCCV 03/06/14.  Marland Kitchen CAD (coronary artery disease)    a. prior CABG in 1997 with redo in 2000. b. last cath in 2008 - managed medically  . Carotid disease, bilateral ([place])    with multiple bilateral carotid surgeries  . Chronic diastolic CHF (congestive heart failure) ([place])   . Chronic respiratory failure with hypoxia ([place])   . CKD (chronic kidney disease), stage IV ([place])    a. Has fistula in place.   . Degenerative joint disease   . Dementia   . Generalized weakness    without overt findings  . GERD (gastroesophageal reflux disease)   . Gout   . Heart murmur   . Hyperlipidemia   . Hypertension   . Hypothyroidism   . Meniere's disease   . On home oxygen therapy    "2L" qhs (09/09/2015  . Orthopnea    Two-pillow  . Peripheral vascular disease ([place])   . Psoriasis   . Shingles   . Sinus bradycardia    a. Toprol D/C'd due to this.   . Type 2 diabetes mellitus  (H[facility])     Medications:  Medications Prior to Admission  Medication Sig Dispense Refill Last Dose  . acetaminophen (TYLENOL) 325 MG tablet Take 2 tablets (650 mg total) by mouth every 4 (four) hours as needed for headache or mild pain.   Unk at [employer]  . atorvastatin (LIPITOR) 80 MG tablet TAKE ONE TABLET BY MOUTH ONCE DAILY (Patient taking differently: Take 80 mg by mouth in the evening) 90 tablet 3 12/25/2017 at pm  . calcium acetate (PHOSLO) 667 MG capsule Take 1,334 mg by mouth 2 (two) times daily with a meal.    12/25/2017 at Unknown time  . Cholecalciferol (VITAMIN D-3) 1000 units CAPS Take 1,000 Units by mouth daily.   12/25/2017 at Unknown time  . divalproex (DEPAKOTE SPRINKLE) 125 MG capsule Take 125 mg by mouth daily.    12/25/2017 at pm  . doxycycline (VIBRA-TABS) 100 MG tablet Take 100 mg by mouth See admin instructions. Take 100 mg by mouth two times a day for 7 days; do not take within 3 hours of calcium acetate  0 12/25/2017 at Unknown time  . insulin aspart (NOVOLOG FLEXPEN) 100 UNIT/ML FlexPen Inject 8 Units into the skin 2 (two) times daily before a meal.  12/25/2017 at Unknown time  . Insulin Glargine (LANTUS SOLOSTAR) 100 UNIT/ML Solostar Pen Inject 24 Units into the skin daily before breakfast.    12/25/2017 at am  . ipratropium (ATROVENT) 0.03 % nasal spray Place 2 sprays into both nostrils 3 (three) times daily as needed for rhinitis.   3 Unk at Unk  . levothyroxine (SYNTHROID, LEVOTHROID) 175 MCG tablet Take 175 mcg by mouth daily before breakfast.   12/25/2017 at am  . memantine (NAMENDA) 5 MG tablet Take 5 mg by mouth daily.    12/25/2017 at Unknown time  . Multiple Vitamins-Minerals (CENTRUM SILVER 50+MEN) TABS Take 1 tablet by mouth daily.   12/25/2017 at am  . multivitamin (RENA-VIT) TABS tablet Take 1 tablet by mouth daily.   12/25/2017 at am  . nitroGLYCERIN (NITROSTAT) 0.4 MG SL tablet Place 1 tablet (0.4 mg total) under the tongue every 5 (five) minutes as needed for chest pain. 30 tablet  12 2018 at 2018  . polyethylene glycol (MIRALAX / GLYCOLAX) packet Take 17 g by mouth daily as needed for mild constipation.   Unk at [facility]  . ranitidine (ZANTAC) 300 MG tablet Take 300 mg by mouth daily.   12/25/2017 at Unknown time  . triamcinolone cream (KENALOG) 0.1 % Apply 1 application topically 2 (two) times daily as needed (to eczema-affected areas of the skin).   1 Past Week at Unknown time  . warfarin (COUMADIN) 5 MG tablet USE AS DIRECTED (Patient taking differently: Take 5 mg by mouth in the evening after supper on Sun/Tues/Thurs/Sat and 7.5 mg on Mon/Wed/Fri) 120 tablet 1 12/25/2017 at 1900   Scheduled:  . aspirin EC  81 mg Oral Daily  . atorvastatin  80 mg Oral q1800  . calcium acetate  1,334 mg Oral BID WC  . Chlorhexidine Gluconate Cloth  6 each Topical Q0600  . cholecalciferol  1,000 Units Oral Daily  . divalproex  125 mg Oral Q12H  . famotidine  20 mg Oral Daily  . insulin aspart  0-5 Units Subcutaneous QHS  . insulin aspart  0-9 Units Subcutaneous TID WC  . insulin glargine  12 Units Subcutaneous Daily  . levothyroxine  175 mcg Oral QAC breakfast  . mouth rinse  15 mL Mouth Rinse BID  . memantine  5 mg Oral Daily  . metoprolol succinate  12.5 mg Oral Daily  . multivitamin  1 tablet Oral Daily  . sodium chloride flush  3 mL Intravenous Q12H  . sodium chloride flush  3 mL Intravenous Q12H  . Warfarin - Pharmacist Dosing Inpatient   Does not apply q1800    Assessment: 29 yoM admitted with SOB and HF. No plans for cath, pharmacy consulted to resume Coumadin for hx of AF on 7/7. INR 5.14 on admit, now down to 2.28.  Home dose: 5mg /day except 7.5mg  MWF (last dose 7/2)  Goal of Therapy:  INR 2-3 Monitor platelets by anticoagulation protocol: Yes   Plan:  -Coumadin 7.5mg  PO x1 tonight -Daily protime  Arrie Senate, PharmD, BCPS Clinical Pharmacist [phone] Please check AMION for all [place] numbers 12/31/2017

## 2017-12-31 NOTE — Care Management Important Message (Signed)
Important Message  Patient Details  Name: [name] MRN: [id number] Date of Birth: 05-24-1934   Medicare Important Message Given:  Yes    Corney Knighton P [place] 12/31/2017, 2:10 PM

## 2017-12-31 NOTE — Progress Notes (Signed)
[facility] TEAM 1 - Stepdown/ICU TEAM  [name]  ZOX:[id number] DOB: 1933-11-06 DOA: 12/26/2017 PCP: Leanna Battles, MD    Brief Narrative:  85yo M w/ Hx dementia, DM2, Paroxysmal A. fib on warfarin, HTN, CAD status post CABG, Chronic Respiratory Failure with Hypoxia, hypothyroidism, and ESRD on HD who presented to the ED w/ shortness of breath.   Significant Events: 7/3 admit   Subjective: The patient is seen while on hemodialysis.  He is less interactive/less conversant.  He is awake.  He denies any complaints whatsoever.  His affect is quite flat and he frequently seems to simply ignore my questions.  There is no evidence of acute respiratory distress.  There is no evidence of uncontrolled pain.  Assessment & Plan:  Acute SOB Multifactorial:   AoS, CHF, ESRD - clinically stable at this time   Acute biventricular systolic CHF Compared to TTE 01/24/2017 significant decrease in patient's cardiac function w/ EF 20-25% - not a candidate for further intervention/evaluation - medical management is our only option   Severe AoS Cards feels pt is a poor candidate for AoVR or even TAVR - not a candidate for further intervention/evaluation - medical management only option   CAD / NSTEMI Not a candidate for further intervention/evaluation - medical management only  ESRD HD M/W/F Dry weight being lowered by Nephrology as pt has lost lean body mass - HD to continue for now but Nephrology is discussing transitioning to hospice care and stopping HD w/ family  Anemia of CKD Hgb stable   Paroxysmal atrial fibrillation (CHADSVASc5+) Warfarin 5mg  Sun/Tue/Thur/Sat; 7.5 M/W/F - rate controlled at this time - cont warfarin per Pharmacy   Dementia Continue Namenda and Depakote - mental status/clarity waxes and wanes - increased depakote to attempt to stabilize mood   DM2 7/3 A1c 5.8 - CBG currently controlled  HLD Continue Lipitor   Hypothyroidism Continue Synthroid   DVT  prophylaxis: warfarin Code Status: DNR - NO CODE Family Communication: no family present at time of exam   Disposition Plan: SNF w/ ongoing HD v/s Hospice facility if decision made to stop HD   Consultants:  Cardiology  Nephrology   Antimicrobials:  Doxycycline 7/4 > 7/6  Objective: Blood pressure (!) 94/51, pulse 62, temperature 98 F (36.7 C), temperature source Axillary, resp. rate (!) 26, height 5\' 8"  (1.727 m), weight 69.7 kg (153 lb 10.6 oz), SpO2 100 %.  Intake/Output Summary (Last 24 hours) at 12/31/2017 1558 Last data filed at 12/31/2017 1310 Gross per 24 hour  Intake 480 ml  Output -  Net 480 ml   Filed Weights   12/30/17 0142 12/31/17 0500 12/31/17 1320  Weight: 68.8 kg (151 lb 11.2 oz) 69.5 kg (153 lb 3.5 oz) 69.7 kg (153 lb 10.6 oz)    Examination: General: No acute respiratory distress - affect flat - not agitated today  Lungs: Clear to auscultation bilaterally w/o wheezing  Cardiovascular: RRR  Abdomen: NT/ND, soft, bs+, no mass Extremities: No signif edema B LE   CBC: Recent Labs  Lab 12/29/17 0240 12/30/17 0330 12/31/17 1329  WBC 6.0 5.6 5.6  HGB 13.7 14.5 12.8*  HCT 43.6 45.6 40.1  MCV 101.2* 100.7* 99.5  PLT 117* 137* 811*   Basic Metabolic Panel: Recent Labs  Lab 12/26/17 1258 12/27/17 0816 12/28/17 0720 12/29/17 0240 12/30/17 0330 12/31/17 1329  NA 137 141 137 137 136 135  K 4.2 3.9 3.9 4.1 4.0 3.9  CL 96* 97* 97* 96* 93* 95*  CO2  28 31 30 31 31 29   GLUCOSE 132* 86 115* 109* 133* 159*  BUN 25* 13 24* 11 21 34*  CREATININE 7.04* 5.08* 6.61* 4.63* 6.74* 8.97*  CALCIUM 9.3 8.8* 8.9 9.0 9.5 9.4  MG  --  2.2  --   --   --   --   PHOS 3.3  --  3.4  --   --  3.3   GFR: Estimated Creatinine Clearance: 6.2 mL/min (A) (by C-G formula based on SCr of 8.97 mg/dL (H)).  Liver Function Tests: Recent Labs  Lab 12/28/17 0720 12/29/17 0240 12/30/17 0330 12/31/17 1329  AST  --  27 24  --   ALT  --  20 21  --   ALKPHOS  --  64 70  --     BILITOT  --  1.3* 1.2  --   PROT  --  6.2* 6.4*  --   ALBUMIN 3.4* 3.6 3.8 3.4*    Coagulation Profile: Recent Labs  Lab 12/27/17 0259 12/28/17 0314 12/29/17 0240 12/30/17 0330 12/31/17 0255  INR 4.67* 4.68* 3.05 2.78 2.28    Cardiac Enzymes: Recent Labs  Lab 12/26/17 1024 12/26/17 2102 12/27/17 0816 12/27/17 1433 12/27/17 2041  TROPONINI 0.36* 0.47* 0.43* 0.37* 0.31*    HbA1C: Hemoglobin A1C  Date/Time Value Ref Range Status  10/01/2015 6.3  Final   Hgb A1c MFr Bld  Date/Time Value Ref Range Status  12/26/2017 10:24 AM 5.8 (H) 4.8 - 5.6 % Final    Comment:    (NOTE) Pre diabetes:          5.7%-6.4% Diabetes:              >6.4% Glycemic control for   <7.0% adults with diabetes   03/04/2014 07:19 PM 7.3 (H) <5.7 % Final    Comment:    (NOTE)                                                                       According to the ADA Clinical Practice Recommendations for 2011, when HbA1c is used as a screening test:  >=6.5%   Diagnostic of Diabetes Mellitus           (if abnormal result is confirmed) 5.7-6.4%   Increased risk of developing Diabetes Mellitus References:Diagnosis and Classification of Diabetes Mellitus,Diabetes XHBZ,1696,78(LFYBO 1):S62-S69 and Standards of Medical Care in         Diabetes - 2011,Diabetes Care,2011,34 (Suppl 1):S11-S61.    CBG: Recent Labs  Lab 12/30/17 1147 12/30/17 1626 12/30/17 2124 12/31/17 0808 12/31/17 1215  GLUCAP 137* 148* 175* 98 125*    Recent Results (from the past 240 hour(s))  MRSA PCR Screening     Status: None   Collection Time: 12/26/17  5:59 AM  Result Value Ref Range Status   MRSA by PCR NEGATIVE NEGATIVE Final    Comment:        The GeneXpert MRSA Assay (FDA approved for NASAL specimens only), is one component of a comprehensive MRSA colonization surveillance program. It is not intended to diagnose MRSA infection nor to guide or monitor treatment for MRSA infections. Performed at [facility], [place] [address] [place], [place] [address]      Scheduled Meds: .  aspirin EC  81 mg Oral Daily  . atorvastatin  80 mg Oral q1800  . calcium acetate  1,334 mg Oral BID WC  . Chlorhexidine Gluconate Cloth  6 each Topical Q0600  . cholecalciferol  1,000 Units Oral Daily  . divalproex  125 mg Oral Q12H  . famotidine  20 mg Oral Daily  . insulin aspart  0-5 Units Subcutaneous QHS  . insulin aspart  0-9 Units Subcutaneous TID WC  . insulin glargine  12 Units Subcutaneous Daily  . levothyroxine  175 mcg Oral QAC breakfast  . mouth rinse  15 mL Mouth Rinse BID  . memantine  5 mg Oral Daily  . metoprolol succinate  12.5 mg Oral Daily  . multivitamin  1 tablet Oral Daily  . sodium chloride flush  3 mL Intravenous Q12H  . sodium chloride flush  3 mL Intravenous Q12H  . warfarin  7.5 mg Oral ONCE-1800  . Warfarin - Pharmacist Dosing Inpatient   Does not apply q1800     LOS: 5 days   Cherene Altes, MD [facility] Office  [phone] Pager - Text Page per Amion as per below:  On-Call/Text Page:      [email or web address]      password [email or web address]  If 7PM-7AM, please contact night-coverage [email or web address] Password [email or web address] 12/31/2017, 3:58 PM

## 2017-12-31 NOTE — Progress Notes (Signed)
I called pharmacy in regards to 1800 dose of Coumadin. I was told by pharmacist that it is okay to give medication because it was suppose to be given at 1800.

## 2018-01-01 ENCOUNTER — Ambulatory Visit: Payer: Medicare Other | Admitting: Podiatry

## 2018-01-01 DIAGNOSIS — Z7189 Other specified counseling: Secondary | ICD-10-CM

## 2018-01-01 DIAGNOSIS — R0602 Shortness of breath: Secondary | ICD-10-CM

## 2018-01-01 LAB — PROTIME-INR
INR: 2.32
PROTHROMBIN TIME: 25.3 s — AB (ref 11.4–15.2)

## 2018-01-01 LAB — GLUCOSE, CAPILLARY
GLUCOSE-CAPILLARY: 69 mg/dL — AB (ref 70–99)
GLUCOSE-CAPILLARY: 99 mg/dL (ref 70–99)
GLUCOSE-CAPILLARY: 105 mg/dL — AB (ref 70–99)
GLUCOSE-CAPILLARY: 117 mg/dL — AB (ref 70–99)
Glucose-Capillary: 179 mg/dL — ABNORMAL HIGH (ref 70–99)

## 2018-01-01 MED ORDER — FENTANYL CITRATE (PF) 100 MCG/2ML IJ SOLN: 12.5000 ug | INTRAMUSCULAR | Status: DC | PRN

## 2018-01-01 MED ORDER — WARFARIN SODIUM 5 MG PO TABS
5.0000 mg | ORAL_TABLET | Freq: Once | ORAL | Status: AC
  Administered 2018-01-01: 5 mg via ORAL
  Filled 2018-01-01: qty 1

## 2018-01-01 MED ORDER — INSULIN GLARGINE 100 UNIT/ML ~~LOC~~ SOLN: 6.0000 [IU] | Freq: Every day | SUBCUTANEOUS | Status: DC

## 2018-01-01 NOTE — Progress Notes (Signed)
[name] - Stepdown/ICU TEAM  [name]  QHU:[id number] DOB: 03/26/1934 DOA: 12/26/2017 PCP: Leanna Battles, MD    Brief Narrative:  84yo M w/ Hx dementia, DM2, Paroxysmal A. fib on warfarin, HTN, CAD status post CABG, Chronic Respiratory Failure with Hypoxia, hypothyroidism, and ESRD on HD who presented to the ED w/ shortness of breath.   Significant Events: 7/3 admit   Subjective: The patient is alert and interactive today.  He tells me he feels bad in general and has poor appetite with poor intake.  He confirms to me that for now, while the family is considering his long-term options, he prefers not to have dialysis.  Assessment & Plan:  Acute SOB Multifactorial:   AoS, CHF, ESRD - clinically stable at this time   Acute biventricular systolic CHF Compared to TTE 8/[name]/2018 significant decrease in patient's cardiac function w/ EF 20-25% - not a candidate for further intervention/evaluation - medical management is our only option   Severe AoS Cards feels pt is a poor candidate for AoVR or even TAVR - not a candidate for further intervention/evaluation - medical management only option   CAD / NSTEMI Not a candidate for further intervention/evaluation - medical management only  ESRD HD M/W/F Dry weight being lowered by Nephrology as pt has lost lean body mass - Nephrology is discussing transitioning to hospice care and stopping HD w/ family and the patient has agreed to stop dialysis for now while disposition options are being considered more extensively with the help of palliative care  Anemia of CKD Hgb stable   Paroxysmal atrial fibrillation (CHADSVASc5+) Warfarin 5mg  Sun/Tue/Thur/Sat; 7.5 M/W/F - rate controlled at this time - cont warfarin per Pharmacy   Dementia Continue Namenda and Depakote - mental status/clarity waxes and wanes - increased depakote during this hospital stay to attempt to stabilize mood/combat agitation   DM2 7/3 A1c 5.8 - CBG  currently controlled  HLD Continue Lipitor for now but if patient persists in decision to stop dialysis this is clearly a nonessential medicine that should be stopped  Hypothyroidism Continue Synthroid   DVT prophylaxis: warfarin Code Status: DNR - NO CODE Family Communication: Spoke with the patient's wife at bedside   Disposition Plan: SNF w/ ongoing HD v/s Hospice facility if decision made to stop HD   Consultants:  Cardiology  Nephrology   Antimicrobials:  Doxycycline 7/4 > 7/6  Objective: Blood pressure (!) 105/94, pulse 65, temperature 97.6 F (36.4 C), temperature source Oral, resp. rate 16, height 5\' 8"  ([name].727 m), weight 69 kg (152 lb [name].9 oz), SpO2 95 %.  Intake/Output Summary (Last 24 hours) at 01/01/2018 1507 Last data filed at 01/01/2018 1350 Gross per 24 hour  Intake 240 ml  Output 1000 ml  Net -760 ml   Filed Weights   12/31/17 1320 12/31/17 1720 01/01/18 0521  Weight: 69.7 kg (153 lb 10.6 oz) 68.5 kg (151 lb 0.2 oz) 69 kg (152 lb [name].9 oz)    Examination: General: No acute respiratory distress Lungs: Clear to auscultation bilaterally Cardiovascular: RRR  Abdomen: NT/ND, soft, bs+ Extremities: No edema B LE   CBC: Recent Labs  Lab 12/29/17 0240 12/30/17 0330 12/31/17 1329  WBC 6.0 5.6 5.6  HGB 13.7 14.5 12.8*  HCT 43.6 45.6 40.[name]  MCV 101.2* 100.7* 99.5  PLT 117* 137* 465*   Basic Metabolic Panel: Recent Labs  Lab 12/26/17 1258 12/27/17 0816 12/28/17 0720 12/29/17 0240 12/30/17 0330 12/31/17 1329  NA 137 141 137 137 136  135  K 4.2 3.9 3.9 4.[name] 4.0 3.9  CL 96* 97* 97* 96* 93* 95*  CO2 28 31 30 31 31 29   GLUCOSE 132* 86 115* 109* 133* 159*  BUN 25* 13 24* 11 21 34*  CREATININE 7.04* 5.08* 6.61* 4.63* 6.74* 8.97*  CALCIUM 9.3 8.8* 8.9 9.0 9.5 9.4  MG  --  2.2  --   --   --   --   PHOS 3.3  --  3.4  --   --  3.3   GFR: Estimated Creatinine Clearance: 6.2 mL/min (A) (by C-G formula based on SCr of 8.97 mg/dL (H)).  Liver Function  Tests: Recent Labs  Lab 12/28/17 0720 12/29/17 0240 12/30/17 0330 12/31/17 1329  AST  --  27 24  --   ALT  --  20 21  --   ALKPHOS  --  64 70  --   BILITOT  --  [name].3* [name].2  --   PROT  --  6.2* 6.4*  --   ALBUMIN 3.4* 3.6 3.8 3.4*    Coagulation Profile: Recent Labs  Lab 12/28/17 0314 12/29/17 0240 12/30/17 0330 12/31/17 0255 01/01/18 0335  INR 4.68* 3.05 2.78 2.28 2.32    Cardiac Enzymes: Recent Labs  Lab 12/26/17 1024 12/26/17 2102 12/27/17 0816 12/27/17 1433 12/27/17 2041  TROPONINI 0.36* 0.47* 0.43* 0.37* 0.31*    HbA1C: Hemoglobin A1C  Date/Time Value Ref Range Status  10/01/2015 6.3  Final   Hgb A1c MFr Bld  Date/Time Value Ref Range Status  12/26/2017 10:24 AM 5.8 (H) 4.8 - 5.6 % Final    Comment:    (NOTE) Pre diabetes:          5.7%-6.4% Diabetes:              >6.4% Glycemic control for   <7.0% adults with diabetes   03/04/2014 07:19 PM 7.3 (H) <5.7 % Final    Comment:    (NOTE)                                                                       According to the ADA Clinical Practice Recommendations for 2011, when HbA1c is used as a screening test:  >=6.5%   Diagnostic of Diabetes Mellitus           (if abnormal result is confirmed) 5.7-6.4%   Increased risk of developing Diabetes Mellitus References:Diagnosis and Classification of Diabetes Mellitus,Diabetes ZOXW,9604,54(UJWJX [name]):S62-S69 and Standards of Medical Care in         Diabetes - 2011,Diabetes BJYN,8295,62 (Suppl [name]):S11-S61.    CBG: Recent Labs  Lab 12/31/17 1215 12/31/17 2118 01/01/18 0750 01/01/18 0828 01/01/18 1218  GLUCAP 125* 116* 69* 99 105*    Recent Results (from the past 240 hour(s))  MRSA PCR Screening     Status: None   Collection Time: 12/26/17  5:59 AM  Result Value Ref Range Status   MRSA by PCR NEGATIVE NEGATIVE Final    Comment:        The GeneXpert MRSA Assay (FDA approved for NASAL specimens only), is one component of a comprehensive MRSA  colonization surveillance program. It is not intended to diagnose MRSA infection nor to guide or monitor treatment for MRSA infections. Performed at  [facility], [place] [address] [place], [place] [address]      Scheduled Meds: . divalproex  125 mg Oral Q12H  . levothyroxine  175 mcg Oral QAC breakfast  . mouth rinse  15 mL Mouth Rinse BID  . memantine  5 mg Oral Daily  . metoprolol succinate  12.5 mg Oral Daily  . sodium chloride flush  3 mL Intravenous Q12H  . sodium chloride flush  3 mL Intravenous Q12H  . warfarin  5 mg Oral ONCE-1800  . Warfarin - Pharmacist Dosing Inpatient   Does not apply q1800     LOS: 6 days   Cherene Altes, MD [facility] Office  [phone] Pager - Text Page per Amion as per below:  On-Call/Text Page:      [email or web address]      password [email or web address]  If 7PM-7AM, please contact night-coverage [email or web address] Password [email or web address] 01/01/2018, 3:07 PM

## 2018-01-01 NOTE — Progress Notes (Signed)
Daily Progress Note   Patient Name: [name]       Date: 01/01/2018 DOB: 1933/12/23  Age: 84 y.o. MRN#: [id number] Attending Physician: Cherene Altes, MD Primary Care Physician: Leanna Battles, MD Admit Date: 12/26/2017  Reason for Consultation/Follow-up: Establishing goals of care and Hospice Evaluation  Subjective: Mr. Aeschliman complains of ongoing shortness of breath. Denies pain. He is flat and tearful.   Length of Stay: 6  Current Medications: Scheduled Meds:  . divalproex  125 mg Oral Q12H  . levothyroxine  175 mcg Oral QAC breakfast  . mouth rinse  15 mL Mouth Rinse BID  . memantine  5 mg Oral Daily  . metoprolol succinate  12.5 mg Oral Daily  . sodium chloride flush  3 mL Intravenous Q12H  . sodium chloride flush  3 mL Intravenous Q12H  . warfarin  5 mg Oral ONCE-1800  . Warfarin - Pharmacist Dosing Inpatient   Does not apply q1800    Continuous Infusions: . sodium chloride      PRN Meds: sodium chloride, acetaminophen **OR** [DISCONTINUED] acetaminophen, fentaNYL (SUBLIMAZE) injection, LORazepam, morphine injection, nitroGLYCERIN, ondansetron **OR** ondansetron (ZOFRAN) IV, polyethylene glycol, senna-docusate, sodium chloride flush  Physical Exam  Constitutional: He appears well-developed.  HENT:  Head: Normocephalic and atraumatic.  Cardiovascular: Normal rate.  Pulmonary/Chest: Effort normal. No accessory muscle usage. No apnea and no tachypnea. No respiratory distress.  Room air  Abdominal: Normal appearance.  Neurological: He is alert.  Confused at times, appears hard of hearing  Nursing note and vitals reviewed.           Vital Signs: BP (!) 105/94 (BP Location: Right Arm)   Pulse 65   Temp 97.6 F (36.4 C) (Oral)   Resp 16   Ht _0  (1.727  m)   Wt 69 kg (152 lb 1.9 oz)   SpO2 95%   BMI 23.13 kg/m  SpO2: SpO2: 95 % O2 Device: O2 Device: Room Air O2 Flow Rate: O2 Flow Rate (L/min): 2 L/min  Intake/output summary:   Intake/Output Summary (Last 24 hours) at 01/01/2018 1501 Last data filed at 01/01/2018 1350 Gross per 24 hour  Intake 240 ml  Output 1000 ml  Net -760 ml   LBM: Last BM Date: 12/30/17 Baseline Weight: Weight: 77.1 kg (170 lb) Most recent weight:  Weight: 69 kg (152 lb 1.9 oz)       Palliative Assessment/Data:    Flowsheet Rows     Most Recent Value  Intake Tab  Referral Department  Hospitalist  Unit at Time of Referral  Other (Comment)  Palliative Care Primary Diagnosis  Cardiac  Date Notified  12/29/17  Palliative Care Type  New Palliative care  Reason for referral  Clarify Goals of Care  Date of Admission  12/26/17  Date first seen by Palliative Care  12/30/17  # of days Palliative referral response time  1 Day(s)  # of days IP prior to Palliative referral  3  Clinical Assessment  Palliative Performance Scale Score  40%  Psychosocial & Spiritual Assessment  Social Work Plan of Care  Clarified patient/family wishes with healthcare team  Palliative Care Outcomes  Patient/Family meeting held?  Yes  Who was at the meeting?  wife  Palliative Care Outcomes  Clarified goals of care      Patient Active Problem List   Diagnosis Date Noted  . Supratherapeutic INR 12/26/2017  . Elevated troponin 12/26/2017  . Hypotension 02/10/2016  . Dehydration 02/10/2016  . Pressure ulcer 01/11/2016  . Chest pain 01/10/2016  . ESRD on dialysis ([place])   . Palliative care encounter   . Goals of care, counseling/discussion   . Acute on chronic diastolic heart failure ([place]) 09/09/2015  . Decreased pedal pulses 06/03/2015  . Fall 03/24/2014  . Aftercare following surgery of the circulatory system, [place] 03/18/2014  . Hypertensive heart disease with heart failure ([place]) 03/18/2014  . Hypothyroidism 03/18/2014  .  Encounter for therapeutic drug monitoring 02/19/2014  . Atrial fibrillation ([place]) 02/13/2014  . Peripheral vascular disease, unspecified ([place]) 11/12/2013  . Occlusion and stenosis of carotid artery without mention of cerebral infarction 03/06/2012  . DOE (dyspnea on exertion) 02/13/2012  . Hyperlipidemia 08/30/2011  . CAD (coronary artery disease) 06/19/2007  . Diabetes mellitus with peripheral vascular disease ([place]) 06/17/2007    Palliative Care Assessment & Plan   HPI: 84 y.o. male  with past medical history of dementia, ESRD on HD (~2 yrs now), CAD s/p CABG, IDDM, afib on warfarin, hypothyroidism, HTN admitted on 12/26/2017 with CHF. TTE revealed EF 20-25% and severe aortic stenosis, both felt only amenable to medical management. Palliative care was asked to help clarify goals with family.   Assessment: I met today with Mr. Edelen and his wife at bedside. Mr. Yeargan is tearful as we discuss the burden of dialysis and how he can no longer do dialysis. "They want me to sit for four hours and I just can't." He is tearful throughout conversation as we discuss transition off of dialysis and into hospice care. We discussed hospice facility (as wife cannot care for him at home) and the benefits and he seems open to going to hospice at this time. We is tired of taking pills and endorsing trouble swallowing pills (minimized for comfort). Spoke with them about talking together with Mr. Sison children tomorrow. He is able to talk today about poor QOL and "not even able to attend church" as he has attended [employer] for many years. He is able to endorse the burden of dialysis and declining QOL that has led to his desire to focus on comfort care. Emotional support provided.   I called and spoke with son/HCPOA, Aaron Edelman, and briefly relayed my conversation with his father. Aaron Edelman and other family will meet with me tomorrow 7/10 at 1 pm. Aaron Edelman seems  supportive of d/c dialysis and transition to  comfort care and hospice but will discuss in more detail tomorrow.   Recommendations/Plan:  Minimize medications for comfort as he is tired of taking pills. Can further minimize after conversation with family tomorrow.   No further plans for dialysis at this time.   Meeting with family tomorrow 1 pm.   Fentanyl IV prn SOB. May consider Roxanol for management but will avoid morphine with ESRD for now given hopes for further discussion.   Code Status:  DNR  Prognosis:   < 2 weeks  Discharge Planning:  To Be Determined   Thank you for allowing the Palliative Medicine Team to assist in the care of this patient.   Total Time 35 min Prolonged Time Billed  no       Greater than 50%  of this time was spent counseling and coordinating care related to the above assessment and plan.  Vinie Sill, NP Palliative Medicine Team Pager # [phone] (M-F 8a-5p) Team Phone # [phone] (Nights/Weekends)

## 2018-01-01 NOTE — Consult Note (Signed)
[facility] CM Primary Care Navigator  01/01/2018  [name]  [birth year] [id number]   Met with patient and wife [name]) at the bedside to identify possible discharge needs.    Patient reportsthathe "couldn't breath"which had led to this admission.  Patient endorsesDr.Daniel Philip Aspen with [facility] as his primary care provider.   [facility] to [place] without difficulty.  Patient's wife has beenmanaging his medications at homeusing "pillbox"system filledonce a week.  Wife hasbeenprovidingtransportation to his doctors' appointments.  Patient reports that wife is his primary caregiver at home and some assistance is provided by son [name]).   Anticipated discharge plan is skilled nursing facility(SNF) per therapy recommendation with ongoing hemodialysis versus Hospice facility if decision is made to stop hemodialysis. Discharge plan is still  to be determined according to wife.  Palliative care team to be consulted.  Patient and wifevoiced understanding to call primary care provider's officeifpatient returns backhome,for a post discharge follow-upvisit within1- 2weeksor sooner if needs arise.Patient letter (with PCP's contact number) was provided astheirreminder.  Explained to patient and wife about [facility] CM services available for healthmanagementand resources, in case patient will be returning back home. Patient and wife verbalizedunderstandingof needto seekreferralfrom primary care provider to [facility] care management ifdeemed necessary and appropriatefor anyservicesin the nearfuture- if patient will return back home.   [facility] care management information provided for future needs thathe may have.   Primary care provider's office is listed as providing transition of care (TOC) follow-up.    For additional questions please contact:  [name],  BSN, RN-BC [facility] PRIMARY CARE Navigator Cell: [phone]

## 2018-01-01 NOTE — Progress Notes (Signed)
Physical Therapy Treatment Patient Details Name: [name] MRN: [id number] DOB: 1933/12/24 Today's Date: 01/01/2018    History of Present Illness REYCE LUBECK is a 84 y.o. male with SOB and CHF. PMHx: DM, AFib, HTN, hypothyroidism, dementia, CAD status post CABG, and ESRD on HD    PT Comments    Pt pleasant and wanting to get OOB to toilet. Pt declined hall ambulation stating he felt "heavy in the middle" pw with HR 64-65 and SpO2 92-99% on RA with activity. Pt educated for HEP and wife states they are considering home with hospice and she has a transport chair to get from car to the stairs but pt would need to ascend 2 steps to enter home and unable to attempt this session. Will continue to follow to maximize function to decrease burden of care.     Follow Up Recommendations  SNF;Supervision/Assistance - 24 hour     Equipment Recommendations  None recommended by PT    Recommendations for Other Services       Precautions / Restrictions Precautions Precautions: Fall    Mobility  Bed Mobility Overal bed mobility: Needs Assistance Bed Mobility: Supine to Sit     Supine to sit: Min guard;HOB elevated     General bed mobility comments: cues for initiation, increased time, HOB 35 degrees  Transfers Overall transfer level: Needs assistance   Transfers: Sit to/from Stand Sit to Stand: Min assist         General transfer comment: min assist to stand from bed and toilet with rail   Ambulation/Gait Ambulation/Gait assistance: Min assist Gait Distance (Feet): 15 Feet Assistive device: Rolling walker (2 wheeled) Gait Pattern/deviations: Step-through pattern;Decreased stride length;Trunk flexed   Gait velocity interpretation: <1.31 ft/sec, indicative of household ambulator General Gait Details: pt with bil shoes on, flexed trunk maintained throughout gait with pt willing to walk 8' bed to toilet then 15' to chair and declined further gait. Assist for balance and  direction of RW   Stairs             Wheelchair Mobility    Modified Rankin (Stroke Patients Only)       Balance Overall balance assessment: Needs assistance   Sitting balance-Leahy Scale: Fair       Standing balance-Leahy Scale: Poor Standing balance comment: requires RW for safe amb                            Cognition Arousal/Alertness: Awake/alert Behavior During Therapy: Flat affect Overall Cognitive Status: History of cognitive impairments - at baseline                                        Exercises General Exercises - Lower Extremity Long Arc Quad: AROM;15 reps;Seated;Both Hip Flexion/Marching: AROM;15 reps;Seated;Both    General Comments        Pertinent Vitals/Pain Pain Assessment: No/denies pain    Home Living                      Prior Function            PT Goals (current goals can now be found in the care plan section) Progress towards PT goals: Progressing toward goals    Frequency    Min 2X/week      PT Plan Current plan remains appropriate  Co-evaluation              AM-PAC PT "6 Clicks" Daily Activity  Outcome Measure  Difficulty turning over in bed (including adjusting bedclothes, sheets and blankets)?: A Little Difficulty moving from lying on back to sitting on the side of the bed? : A Little Difficulty sitting down on and standing up from a chair with arms (e.g., wheelchair, bedside commode, etc,.)?: Unable Help needed moving to and from a bed to chair (including a wheelchair)?: A Little Help needed walking in hospital room?: A Little Help needed climbing 3-5 steps with a railing? : A Lot 6 Click Score: 15    End of Session Equipment Utilized During Treatment: Gait belt Activity Tolerance: Patient tolerated treatment well Patient left: in chair;with call bell/phone within reach;with chair alarm set;with family/visitor present Nurse Communication: Mobility status PT  Visit Diagnosis: Unsteadiness on feet (R26.81);History of falling (Z91.81);Muscle weakness (generalized) (M62.81)     Time: 9233-0076 PT Time Calculation (min) (ACUTE ONLY): 20 min  Charges:  $Gait Training: 8-22 mins                    G Codes:       Elwyn Reach, PT [phone]    [place] 01/01/2018, 12:45 PM

## 2018-01-01 NOTE — Progress Notes (Signed)
[place] for coumadin Indication: atrial fibrillation  Allergies  Allergen Reactions  . Betadine [Povidone Iodine] Rash  . Iodinated Diagnostic Agents Rash and Other (See Comments)    Does fine with premedications for cath  . Latex Hives  . Tape Rash    MUST BE FREE OF ANY LATEX    Patient Measurements: Height: 5\' 8"  (172.7 cm) Weight: 152 lb 1.9 oz (69 kg) IBW/kg (Calculated) : 68.4   Vital Signs: Temp: 98.2 F (36.8 C) (07/09 0709) Temp Source: Oral (07/09 0709) BP: 124/53 (07/09 0709) Pulse Rate: 57 (07/09 0325)  Labs: Recent Labs    12/30/17 0330 12/31/17 0255 12/31/17 1329 01/01/18 0335  HGB 14.5  --  12.8*  --   HCT 45.6  --  40.1  --   PLT 137*  --  130*  --   LABPROT 29.1* 24.9*  --  25.3*  INR 2.78 2.28  --  2.32  CREATININE 6.74*  --  8.97*  --     Estimated Creatinine Clearance: 6.2 mL/min (A) (by C-G formula based on SCr of 8.97 mg/dL (H)).   Medical History: Past Medical History:  Diagnosis Date  . Atrial fibrillation ([place]) Aug. 2015   a. Dx 01/2014, s/p DCCV 03/06/14.  [place] CAD (coronary artery disease)    a. prior CABG in 1997 with redo in 2000. b. last cath in 2008 - managed medically  . Carotid disease, bilateral ([place])    with multiple bilateral carotid surgeries  . Chronic diastolic CHF (congestive heart failure) ([place])   . Chronic respiratory failure with hypoxia ([place])   . CKD (chronic kidney disease), stage IV ([place])    a. Has fistula in place.   . Degenerative joint disease   . Dementia   . Generalized weakness    without overt findings  . GERD (gastroesophageal reflux disease)   . Gout   . Heart murmur   . Hyperlipidemia   . Hypertension   . Hypothyroidism   . Meniere's disease   . On home oxygen therapy    "2L" qhs (09/09/2015  . Orthopnea    Two-pillow  . Peripheral vascular disease ([place])   . Psoriasis   . Shingles   . Sinus bradycardia    a. Toprol D/C'd due to this.   . Type 2  diabetes mellitus (HCC)     Medications:  Medications Prior to Admission  Medication Sig Dispense Refill Last Dose  . acetaminophen (TYLENOL) 325 MG tablet Take 2 tablets (650 mg total) by mouth every 4 (four) hours as needed for headache or mild pain.   Unk at [employer]  . atorvastatin (LIPITOR) 80 MG tablet TAKE ONE TABLET BY MOUTH ONCE DAILY (Patient taking differently: Take 80 mg by mouth in the evening) 90 tablet 3 12/25/2017 at pm  . calcium acetate (PHOSLO) 667 MG capsule Take 1,334 mg by mouth 2 (two) times daily with a meal.    12/25/2017 at Unknown time  . Cholecalciferol (VITAMIN D-3) 1000 units CAPS Take 1,000 Units by mouth daily.   12/25/2017 at Unknown time  . divalproex (DEPAKOTE SPRINKLE) 125 MG capsule Take 125 mg by mouth daily.    12/25/2017 at pm  . doxycycline (VIBRA-TABS) 100 MG tablet Take 100 mg by mouth See admin instructions. Take 100 mg by mouth two times a day for 7 days; do not take within 3 hours of calcium acetate  0 12/25/2017 at Unknown time  . insulin aspart (NOVOLOG FLEXPEN)  100 UNIT/ML FlexPen Inject 8 Units into the skin 2 (two) times daily before a meal.   12/25/2017 at Unknown time  . Insulin Glargine (LANTUS SOLOSTAR) 100 UNIT/ML Solostar Pen Inject 24 Units into the skin daily before breakfast.    12/25/2017 at am  . ipratropium (ATROVENT) 0.03 % nasal spray Place 2 sprays into both nostrils 3 (three) times daily as needed for rhinitis.   3 Unk at Unk  . levothyroxine (SYNTHROID, LEVOTHROID) 175 MCG tablet Take 175 mcg by mouth daily before breakfast.   12/25/2017 at am  . memantine (NAMENDA) 5 MG tablet Take 5 mg by mouth daily.    12/25/2017 at Unknown time  . Multiple Vitamins-Minerals (CENTRUM SILVER 50+MEN) TABS Take 1 tablet by mouth daily.   12/25/2017 at am  . multivitamin (RENA-VIT) TABS tablet Take 1 tablet by mouth daily.   12/25/2017 at am  . nitroGLYCERIN (NITROSTAT) 0.4 MG SL tablet Place 1 tablet (0.4 mg total) under the tongue every 5 (five) minutes as needed for  chest pain. 30 tablet 12 2018 at 2018  . polyethylene glycol (MIRALAX / GLYCOLAX) packet Take 17 g by mouth daily as needed for mild constipation.   Unk at [facility]  . ranitidine (ZANTAC) 300 MG tablet Take 300 mg by mouth daily.   12/25/2017 at Unknown time  . triamcinolone cream (KENALOG) 0.1 % Apply 1 application topically 2 (two) times daily as needed (to eczema-affected areas of the skin).   1 Past Week at Unknown time  . warfarin (COUMADIN) 5 MG tablet USE AS DIRECTED (Patient taking differently: Take 5 mg by mouth in the evening after supper on Sun/Tues/Thurs/Sat and 7.5 mg on Mon/Wed/Fri) 120 tablet 1 12/25/2017 at 1900   Scheduled:  . aspirin EC  81 mg Oral Daily  . atorvastatin  80 mg Oral q1800  . calcium acetate  1,334 mg Oral BID WC  . Chlorhexidine Gluconate Cloth  6 each Topical Q0600  . cholecalciferol  1,000 Units Oral Daily  . divalproex  125 mg Oral Q12H  . famotidine  20 mg Oral Daily  . insulin aspart  0-5 Units Subcutaneous QHS  . insulin aspart  0-9 Units Subcutaneous TID WC  . insulin glargine  12 Units Subcutaneous Daily  . levothyroxine  175 mcg Oral QAC breakfast  . mouth rinse  15 mL Mouth Rinse BID  . memantine  5 mg Oral Daily  . metoprolol succinate  12.5 mg Oral Daily  . multivitamin  1 tablet Oral Daily  . sodium chloride flush  3 mL Intravenous Q12H  . sodium chloride flush  3 mL Intravenous Q12H  . Warfarin - Pharmacist Dosing Inpatient   Does not apply q1800    Assessment: 67 yoM admitted with SOB and HF. No plans for cath, pharmacy consulted to resume Coumadin for hx of AF on 7/7. INR 5.14 on admit, now therapeutic at 2.32.  Home dose: 5mg /day except 7.5mg  MWF (last dose 7/2)  Goal of Therapy:  INR 2-3 Monitor platelets by anticoagulation protocol: Yes   Plan:  -Coumadin 5mg  PO x1 tonight -Daily protime/INR   Brendolyn Patty, PharmD PGY1 Pharmacy Resident Phone [phone]  01/01/2018   9:42 AM

## 2018-01-01 NOTE — Progress Notes (Signed)
[facility] Progress Note  Subjective: I met w/ patient and his wife today w/ RN present and had further discussions about possible transition to hospice with dialysis withdrawal.  Patient is open to this option but would want to go home, he is not enthusiastic about facility placement.  Wife seems anxious about this possibility.  I told them I would get palliative care team to meet with them, the wife asks to make sure the pt's biological son is involved in the meetings (I have spoken to the son who is supportive of dialysis withdrawal if decided by doctor's and/or patient).  I asked him if we would like to continue dialysis sessions while these discussions are underway and he said "no".  Dialysis will be put on hold for now.    Vitals:   01/01/18 0709 01/01/18 1038 01/01/18 1219 01/01/18 1242  BP: (!) 124/53  (!) 105/94   Pulse:  71 65 65  Resp:      Temp: 98.2 F (36.8 C)  97.6 F (36.4 C)   TempSrc: Oral  Oral   SpO2:    95%  Weight:      Height:        Inpatient medications: . aspirin EC  81 mg Oral Daily  . atorvastatin  80 mg Oral q1800  . calcium acetate  1,334 mg Oral BID WC  . cholecalciferol  1,000 Units Oral Daily  . divalproex  125 mg Oral Q12H  . famotidine  20 mg Oral Daily  . insulin aspart  0-5 Units Subcutaneous QHS  . insulin aspart  0-9 Units Subcutaneous TID WC  . levothyroxine  175 mcg Oral QAC breakfast  . mouth rinse  15 mL Mouth Rinse BID  . memantine  5 mg Oral Daily  . metoprolol succinate  12.5 mg Oral Daily  . multivitamin  1 tablet Oral Daily  . sodium chloride flush  3 mL Intravenous Q12H  . sodium chloride flush  3 mL Intravenous Q12H  . warfarin  5 mg Oral ONCE-1800  . Warfarin - Pharmacist Dosing Inpatient   Does not apply q1800   . sodium chloride     sodium chloride, acetaminophen **OR** [DISCONTINUED] acetaminophen, HYDROcodone-acetaminophen, LORazepam, morphine injection, nitroGLYCERIN, ondansetron **OR** ondansetron  (ZOFRAN) IV, polyethylene glycol, senna-docusate, sodium chloride flush  Exam:  alert frail elderly male  no jvd  Chest cta bilat  Cor reg w/o mrg  abd soft ntnd  ext no edema   CXR - 7/3 mild edema   Dialysis:  [facility]  4h  75kg  2/2 bath  Hep 8000   lua avf        Impression: 1 DOE/ SOB - 5L removed w/ HD x 2 here , breathing better.  2 Severe AS/ severe CM EF 25% - not intervention candidate per cardiology 3 ESRD on HD MWF. HD today.  4 Dementia - progressive 5 Debility - progressive 6 QOL - worsening   Plan - had further discussions today w/ pt and his wife (see above). Pt is open to the idea of hospice / stopping HD.  I would like palliative care to meet w/ them now to discuss hospice setting options.  Holding off on HD for now until decisions are made.     Kelly Splinter MD [employer] pager [phone]   01/01/2018, 12:45 PM   Recent Labs  Lab 12/28/17 0720  12/30/17 0330 12/31/17 0255 12/31/17 1329 01/01/18 0335  NA 137   < >  136  --  135  --   K 3.9   < > 4.0  --  3.9  --   CL 97*   < > 93*  --  95*  --   CO2 30   < > 31  --  29  --   GLUCOSE 115*   < > 133*  --  159*  --   BUN 24*   < > 21  --  34*  --   CREATININE 6.61*   < > 6.74*  --  8.97*  --   CALCIUM 8.9   < > 9.5  --  9.4  --   PHOS 3.4  --   --   --  3.3  --   ALBUMIN 3.4*   < > 3.8  --  3.4*  --   INR  --    < > 2.78 2.28  --  2.32   < > = values in this interval not displayed.   Recent Labs  Lab 12/29/17 0240 12/30/17 0330  AST 27 24  ALT 20 21  ALKPHOS 64 70  BILITOT 1.3* 1.2  PROT 6.2* 6.4*   Recent Labs  Lab 12/30/17 0330 12/31/17 1329  WBC 5.6 5.6  HGB 14.5 12.8*  HCT 45.6 40.1  MCV 100.7* 99.5  PLT 137* 130*   Iron/TIBC/Ferritin/ %Sat    Component Value Date/Time   IRON 59 12/30/2017 0330   TIBC 192 (L) 12/30/2017 0330   FERRITIN 1,480 (H) 12/30/2017 0330   IRONPCTSAT 31 12/30/2017 0330

## 2018-01-01 NOTE — Progress Notes (Signed)
Hypoglycemic Event  CBG: 69  Treatment: orange juice & chocolate pudding  Symptoms: asymptomatic   Follow-up CBG: Time: 0828 CBG Result: 99  Possible Reasons for Event: decreased appetite and poor intake  Comments/MD notified: Thereasa Solo, MD paged    Basil Dess

## 2018-01-02 DIAGNOSIS — J810 Acute pulmonary edema: Secondary | ICD-10-CM

## 2018-01-02 DIAGNOSIS — I480 Paroxysmal atrial fibrillation: Secondary | ICD-10-CM

## 2018-01-02 DIAGNOSIS — E1151 Type 2 diabetes mellitus with diabetic peripheral angiopathy without gangrene: Secondary | ICD-10-CM

## 2018-01-02 LAB — GLUCOSE, CAPILLARY
GLUCOSE-CAPILLARY: 182 mg/dL — AB (ref 70–99)
Glucose-Capillary: 102 mg/dL — ABNORMAL HIGH (ref 70–99)

## 2018-01-02 LAB — PROTIME-INR
INR: 2.74
Prothrombin Time: 28.8 seconds — ABNORMAL HIGH (ref 11.4–15.2)

## 2018-01-02 MED ORDER — LORAZEPAM 2 MG/ML PO CONC: 0.6000 mg | ORAL | 0 refills | Status: AC | PRN

## 2018-01-02 MED ORDER — MORPHINE SULFATE (CONCENTRATE) 10 MG/0.5ML PO SOLN: 5.0000 mg | ORAL | Status: DC | PRN

## 2018-01-02 MED ORDER — MORPHINE SULFATE (CONCENTRATE) 10 MG/0.5ML PO SOLN: 5.0000 mg | ORAL | 0 refills | Status: AC | PRN

## 2018-01-02 MED ORDER — WARFARIN SODIUM 5 MG PO TABS: 5.0000 mg | ORAL_TABLET | Freq: Once | ORAL | Status: DC

## 2018-01-02 NOTE — Progress Notes (Deleted)
PROGRESS NOTE  [name] KYH:[id number] DOB: Nov 16, 1933 DOA: 12/26/2017 PCP: Leanna Battles, MD   LOS: 7 days   Brief Narrative / Interim history: 84yo M w/ Hx dementia, DM2, Paroxysmal A. fib on warfarin,HTN, CAD status post CABG, Chronic Respiratory Failure with Hypoxia, hypothyroidism, and ESRD on HD who presented to the ED w/ shortness of breath.   Assessment & Plan: Principal Problem:   Acute on chronic diastolic heart failure (HCC) Active Problems:   Diabetes mellitus with peripheral vascular disease (HCC)   CAD (coronary artery disease)   Atrial fibrillation (HCC)   Hypothyroidism   ESRD on dialysis [facility])   Palliative care encounter   Chest pain   Supratherapeutic INR   Elevated troponin   Goals of care -Patient with significant medical issues with severe AS, coronary artery disease, systolic CHF, end-stage renal disease, A. fib, not a candidate for many aggressive interventions, expressing wishes to stop dialysis palliative consulted and will have a family meeting today  Dyspnea -Likely multifactorial in the setting of CHF, aortic stenosis, end-stage renal disease, currently stable and he is on room air  Acute biventricular systolic CHF -Compared to TTE 01/24/2017 significant decrease in patient's cardiac function w/ EF 20-25% - not a candidate for further intervention/evaluation - medical management is our only option   Severe AoS -Cards feels pt is a poor candidate for AoVR or even TAVR - not a candidate for further intervention/evaluation - medical management only option   CAD / NSTEMI -Not a candidate for further intervention/evaluation - medical management only  ESRD HD M/W/F -Dry weight being lowered by Nephrology as pt has lost lean body mass - Nephrology is discussing transitioning to hospice care and stopping HD w/ family and the patient has agreed to stop dialysis for now while disposition options are being considered more extensively with the help  of palliative care  Anemia of CKD -Hgb stable   Paroxysmal atrial fibrillation (CHADSVASc5+) -continue coumadin per pharmacy  Dementia -Continue Namenda and Depakote - mental status/clarity waxes and wanes - increased depakote during this hospital stay to attempt to stabilize mood/combat agitation   DM2 -7/3 A1c 5.8 - CBG currently controlled  HLD -Continue Lipitor for now but if patient persists in decision to stop dialysis this is clearly a nonessential medicine that should be stopped  Hypothyroidism -Continue Synthroid     DVT prophylaxis: Coumadin Code Status: DNR Family Communication: no family present at bedside Disposition Plan: TBD  Consultants:   Nephrology   Cardiology   Palliative   Procedures:   2D echo Impressions: - Since the prior study on 01/24/2017 LVEf has decreased from 55-60% to 20-25%. RVEF is also severely decrased. Aortic stenosis remains severe (gradients in the moderate range, however low LVEF).   Moderate pulmonary hypertension.  Antimicrobials: Doxycycline 7/4 > 7/6      Subjective: - no chest pain, shortness of breath, no abdominal pain, nausea or vomiting.   Objective: Vitals:   01/02/18 0335 01/02/18 0425 01/02/18 0759 01/02/18 1159  BP: (!) 117/52  (!) 103/48 (!) 115/48  Pulse: 62  (!) 58 (!) 57  Resp: (!) 21  19 18   Temp: 98.6 F (37 C)  98.3 F (36.8 C) 98.9 F (37.2 C)  TempSrc: Oral  Oral Oral  SpO2: 97%  97% 100%  Weight:  67.3 kg (148 lb 5.9 oz)    Height:        Intake/Output Summary (Last 24 hours) at 01/02/2018 1203 Last data filed at 01/01/2018 1830  Gross per 24 hour  Intake 240 ml  Output -  Net 240 ml   Filed Weights   12/31/17 1720 01/01/18 0521 01/02/18 0425  Weight: 68.5 kg (151 lb 0.2 oz) 69 kg (152 lb 1.9 oz) 67.3 kg (148 lb 5.9 oz)    Examination:  Constitutional: NAD, frail appearing male Eyes: lids and conjunctivae normal ENMT: Mucous membranes are moist. No oropharyngeal  exudates Respiratory: clear to auscultation bilaterally, no wheezing, no crackles. Normal respiratory effort. Cardiovascular: Regular rate and rhythm, 3/6 SEM Abdomen: no tenderness. Bowel sounds positive.  Musculoskeletal: no clubbing / cyanosis.  Skin: no rashes Neurologic: CN 2-12 grossly intact. Strength 5/5 in all 4.    Data Reviewed: I have independently reviewed following labs and imaging studies  CBC: Recent Labs  Lab 12/27/17 0816 12/28/17 0720 12/29/17 0240 12/30/17 0330 12/31/17 1329  WBC 4.9 5.8 6.0 5.6 5.6  HGB 13.1 12.5* 13.7 14.5 12.8*  HCT 41.8 39.9 43.6 45.6 40.1  MCV 102.5* 101.3* 101.2* 100.7* 99.5  PLT 104* 127* 117* 137* 329*   Basic Metabolic Panel: Recent Labs  Lab 12/26/17 1258 12/27/17 0816 12/28/17 0720 12/29/17 0240 12/30/17 0330 12/31/17 1329  NA 137 141 137 137 136 135  K 4.2 3.9 3.9 4.1 4.0 3.9  CL 96* 97* 97* 96* 93* 95*  CO2 28 31 30 31 31 29   GLUCOSE 132* 86 115* 109* 133* 159*  BUN 25* 13 24* 11 21 34*  CREATININE 7.04* 5.08* 6.61* 4.63* 6.74* 8.97*  CALCIUM 9.3 8.8* 8.9 9.0 9.5 9.4  MG  --  2.2  --   --   --   --   PHOS 3.3  --  3.4  --   --  3.3   GFR: Estimated Creatinine Clearance: 6.1 mL/min (A) (by C-G formula based on SCr of 8.97 mg/dL (H)). Liver Function Tests: Recent Labs  Lab 12/26/17 1258 12/28/17 0720 12/29/17 0240 12/30/17 0330 12/31/17 1329  AST  --   --  27 24  --   ALT  --   --  20 21  --   ALKPHOS  --   --  64 70  --   BILITOT  --   --  1.3* 1.2  --   PROT  --   --  6.2* 6.4*  --   ALBUMIN 3.5 3.4* 3.6 3.8 3.4*   No results for input(s): LIPASE, AMYLASE in the last 168 hours. No results for input(s): AMMONIA in the last 168 hours. Coagulation Profile: Recent Labs  Lab 12/29/17 0240 12/30/17 0330 12/31/17 0255 01/01/18 0335 01/02/18 0324  INR 3.05 2.78 2.28 2.32 2.74   Cardiac Enzymes: Recent Labs  Lab 12/26/17 2102 12/27/17 0816 12/27/17 1433 12/27/17 2041  TROPONINI 0.47* 0.43* 0.37*  0.31*   BNP (last 3 results) No results for input(s): PROBNP in the last 8760 hours. HbA1C: No results for input(s): HGBA1C in the last 72 hours. CBG: Recent Labs  Lab 01/01/18 0828 01/01/18 1218 01/01/18 1714 01/01/18 2154 01/02/18 0818  GLUCAP 99 105* 117* 179* 102*   Lipid Profile: No results for input(s): CHOL, HDL, LDLCALC, TRIG, CHOLHDL, LDLDIRECT in the last 72 hours. Thyroid Function Tests: No results for input(s): TSH, T4TOTAL, FREET4, T3FREE, THYROIDAB in the last 72 hours. Anemia Panel: No results for input(s): VITAMINB12, FOLATE, FERRITIN, TIBC, IRON, RETICCTPCT in the last 72 hours. Urine analysis:    Component Value Date/Time   COLORURINE YELLOW 09/17/2015 1815   APPEARANCEUR CLOUDY (A) 09/17/2015 1815  LABSPEC 1.010 09/17/2015 1815   PHURINE 5.0 09/17/2015 1815   GLUCOSEU NEGATIVE 09/17/2015 1815   HGBUR SMALL (A) 09/17/2015 1815   BILIRUBINUR NEGATIVE 09/17/2015 1815   KETONESUR NEGATIVE 09/17/2015 1815   PROTEINUR NEGATIVE 09/17/2015 1815   UROBILINOGEN 1.0 03/23/2014 2156   NITRITE NEGATIVE 09/17/2015 1815   LEUKOCYTESUR LARGE (A) 09/17/2015 1815   Sepsis Labs: Invalid input(s): PROCALCITONIN, LACTICIDVEN  Recent Results (from the past 240 hour(s))  MRSA PCR Screening     Status: None   Collection Time: 12/26/17  5:59 AM  Result Value Ref Range Status   MRSA by PCR NEGATIVE NEGATIVE Final    Comment:        The GeneXpert MRSA Assay (FDA approved for NASAL specimens only), is one component of a comprehensive MRSA colonization surveillance program. It is not intended to diagnose MRSA infection nor to guide or monitor treatment for MRSA infections. Performed at [facility], [place] [address] [place], [place] [address]       Radiology Studies: No results found.   Scheduled Meds: . divalproex  125 mg Oral Q12H  . levothyroxine  175 mcg Oral QAC breakfast  . mouth rinse  15 mL Mouth Rinse BID  . memantine  5 mg Oral Daily  .  metoprolol succinate  12.5 mg Oral Daily  . sodium chloride flush  3 mL Intravenous Q12H  . sodium chloride flush  3 mL Intravenous Q12H  . warfarin  5 mg Oral ONCE-1800  . Warfarin - Pharmacist Dosing Inpatient   Does not apply q1800   Continuous Infusions: . sodium chloride       Marzetta Board, MD, PhD [facility] Pager [phone]  If 7PM-7AM, please contact night-coverage [email or web address] Password [email or web address] 01/02/2018, 12:03 PM

## 2018-01-02 NOTE — Clinical Social Work Note (Signed)
CSW facilitated patient discharge including contacting patient family (family at bedside) and facility to confirm patient discharge plans. Clinical information faxed to facility and family agreeable with plan. CSW arranged ambulance transport via [facility] to [facility]. RN to call report prior to discharge [phone]).  CSW will sign off for now as social work intervention is no longer needed. Please consult [place] again if new needs arise.'  [name], [place]

## 2018-01-02 NOTE — Progress Notes (Signed)
[place] for coumadin Indication: atrial fibrillation  Allergies  Allergen Reactions  . Betadine [Povidone Iodine] Rash  . Iodinated Diagnostic Agents Rash and Other (See Comments)    Does fine with premedications for cath  . Latex Hives  . Tape Rash    MUST BE FREE OF ANY LATEX    Patient Measurements: Height: 5\' 8"  (172.7 cm) Weight: 148 lb 5.9 oz (67.3 kg) IBW/kg (Calculated) : 68.4   Vital Signs: Temp: 98.3 F (36.8 C) (07/10 0759) Temp Source: Oral (07/10 0759) BP: 103/48 (07/10 0759) Pulse Rate: 58 (07/10 0759)  Labs: Recent Labs    12/31/17 0255 12/31/17 1329 01/01/18 0335 01/02/18 0324  HGB  --  12.8*  --   --   HCT  --  40.1  --   --   PLT  --  130*  --   --   LABPROT 24.9*  --  25.3* 28.8*  INR 2.28  --  2.32 2.74  CREATININE  --  8.97*  --   --     Estimated Creatinine Clearance: 6.1 mL/min (A) (by C-G formula based on SCr of 8.97 mg/dL (H)).   Medical History: Past Medical History:  Diagnosis Date  . Atrial fibrillation ([place]) Aug. 2015   a. Dx 01/2014, s/p DCCV 03/06/14.  Marland Kitchen CAD (coronary artery disease)    a. prior CABG in 1997 with redo in 2000. b. last cath in 2008 - managed medically  . Carotid disease, bilateral ([place])    with multiple bilateral carotid surgeries  . Chronic diastolic CHF (congestive heart failure) ([place])   . Chronic respiratory failure with hypoxia ([place])   . CKD (chronic kidney disease), stage IV ([place])    a. Has fistula in place.   . Degenerative joint disease   . Dementia   . Generalized weakness    without overt findings  . GERD (gastroesophageal reflux disease)   . Gout   . Heart murmur   . Hyperlipidemia   . Hypertension   . Hypothyroidism   . Meniere's disease   . On home oxygen therapy    "2L" qhs (09/09/2015  . Orthopnea    Two-pillow  . Peripheral vascular disease ([place])   . Psoriasis   . Shingles   . Sinus bradycardia    a. Toprol D/C'd due to this.   . Type 2  diabetes mellitus (HCC)     Medications:  Medications Prior to Admission  Medication Sig Dispense Refill Last Dose  . acetaminophen (TYLENOL) 325 MG tablet Take 2 tablets (650 mg total) by mouth every 4 (four) hours as needed for headache or mild pain.   Unk at [employer]  . atorvastatin (LIPITOR) 80 MG tablet TAKE ONE TABLET BY MOUTH ONCE DAILY (Patient taking differently: Take 80 mg by mouth in the evening) 90 tablet 3 12/25/2017 at pm  . calcium acetate (PHOSLO) 667 MG capsule Take 1,334 mg by mouth 2 (two) times daily with a meal.    12/25/2017 at Unknown time  . Cholecalciferol (VITAMIN D-3) 1000 units CAPS Take 1,000 Units by mouth daily.   12/25/2017 at Unknown time  . divalproex (DEPAKOTE SPRINKLE) 125 MG capsule Take 125 mg by mouth daily.    12/25/2017 at pm  . doxycycline (VIBRA-TABS) 100 MG tablet Take 100 mg by mouth See admin instructions. Take 100 mg by mouth two times a day for 7 days; do not take within 3 hours of calcium acetate  0 12/25/2017 at  Unknown time  . insulin aspart (NOVOLOG FLEXPEN) 100 UNIT/ML FlexPen Inject 8 Units into the skin 2 (two) times daily before a meal.   12/25/2017 at Unknown time  . Insulin Glargine (LANTUS SOLOSTAR) 100 UNIT/ML Solostar Pen Inject 24 Units into the skin daily before breakfast.    12/25/2017 at am  . ipratropium (ATROVENT) 0.03 % nasal spray Place 2 sprays into both nostrils 3 (three) times daily as needed for rhinitis.   3 Unk at Unk  . levothyroxine (SYNTHROID, LEVOTHROID) 175 MCG tablet Take 175 mcg by mouth daily before breakfast.   12/25/2017 at am  . memantine (NAMENDA) 5 MG tablet Take 5 mg by mouth daily.    12/25/2017 at Unknown time  . Multiple Vitamins-Minerals (CENTRUM SILVER 50+MEN) TABS Take 1 tablet by mouth daily.   12/25/2017 at am  . multivitamin (RENA-VIT) TABS tablet Take 1 tablet by mouth daily.   12/25/2017 at am  . nitroGLYCERIN (NITROSTAT) 0.4 MG SL tablet Place 1 tablet (0.4 mg total) under the tongue every 5 (five) minutes as needed for  chest pain. 30 tablet 12 2018 at 2018  . polyethylene glycol (MIRALAX / GLYCOLAX) packet Take 17 g by mouth daily as needed for mild constipation.   Unk at [facility]  . ranitidine (ZANTAC) 300 MG tablet Take 300 mg by mouth daily.   12/25/2017 at Unknown time  . triamcinolone cream (KENALOG) 0.1 % Apply 1 application topically 2 (two) times daily as needed (to eczema-affected areas of the skin).   1 Past Week at Unknown time  . warfarin (COUMADIN) 5 MG tablet USE AS DIRECTED (Patient taking differently: Take 5 mg by mouth in the evening after supper on Sun/Tues/Thurs/Sat and 7.5 mg on Mon/Wed/Fri) 120 tablet 1 12/25/2017 at 1900   Scheduled:  . divalproex  125 mg Oral Q12H  . levothyroxine  175 mcg Oral QAC breakfast  . mouth rinse  15 mL Mouth Rinse BID  . memantine  5 mg Oral Daily  . metoprolol succinate  12.5 mg Oral Daily  . sodium chloride flush  3 mL Intravenous Q12H  . sodium chloride flush  3 mL Intravenous Q12H  . warfarin  5 mg Oral ONCE-1800  . Warfarin - Pharmacist Dosing Inpatient   Does not apply q1800    Assessment: 62 yoM admitted with SOB and HF. No plans for cath, pharmacy consulted to resume Coumadin for hx of AF on 7/7. INR 5.14 on admit, now therapeutic at 2.74 (trending up and approaching top of goal range). In the setting of acute HF exacerbation and consistently poor PO intake, will conservatively manage Coumadin dosing.  Home dose: 5mg /day except 7.5mg  MWF (last dose 7/2)  Goal of Therapy:  INR 2-3 Monitor platelets by anticoagulation protocol: Yes   Plan:  -Coumadin 5mg  PO x1 tonight -Daily protime/INR   Brendolyn Patty, PharmD PGY1 Pharmacy Resident Phone [phone]  01/02/2018   9:16 AM

## 2018-01-02 NOTE — Clinical Social Work Note (Addendum)
CSW made [facility] referral to [employer].  Dayton Scrape, [place] [phone]  2:59 pm Patient has a bed at [facility] today. Harmon Pier is on the unit to complete paperwork with family. DNR on front of chart to be signed. MD aware.  Dayton Scrape, [place]

## 2018-01-02 NOTE — Progress Notes (Signed)
Report called to receiving nurse at [facility]. PIV removed without complications. Patient discharged with PTAR.   Basil Dess, RN

## 2018-01-02 NOTE — Progress Notes (Signed)
Pt is now full comfort care, no further dialysis.  Plan is for dc to [facility] for hospice care.  Will sign off.   Kelly Splinter MD [employer] 01/02/2018, 3:12 PM

## 2018-01-02 NOTE — Discharge Summary (Signed)
Physician Discharge Summary  [name] WJX:[id number] DOB: [birth year] DOA: 12/26/2017  PCP: Leanna Battles, MD  Admit date: 12/26/2017 Discharge date: 01/02/2018  Admitted From: home Disposition:  Residential hospice, [facility]  Recommendations for Outpatient Follow-up:  Discharged to residential hospice   Discharge Condition: guarded CODE STATUS: DNR Diet recommendation: comfort feeding   HPI: Per Dr. Myna Hidalgo, [name] is a 84 y.o. male with medical history significant for insulin-dependent diabetes mellitus, paroxysmal atrial fibrillation on warfarin, hypothyroidism, hypertension, dementia, CAD status post CABG, and end-stage renal disease on hemodialysis, now presenting to the emergency department for evaluation of acute shortness of breath.  Patient is accompanied by his wife who assists with the history.  He has reportedly been complaining of shortness of breath and chest pain intermittently for the past 2 months, but woke overnight with acute shortness of breath and cough.  He reports pain "all over."  He also acknowledges ongoing chest pain.  He is not very active at all and unable to identify any alleviating or exacerbating factors.  Denies any missed or incomplete dialysis sessions recently.  Denies dietary indiscretion. Reports recent UTI diagnosis at dialysis, now status-post treatment.   Hospital Course: Goals of care -Patient with significant medical issues with severe AS not amenable to repair, coronary artery disease not being a candidate for further interventions,  new onset systolic CHF, end-stage renal disease, A. fib, not a candidate for many aggressive interventions, expressing wishes to stop dialysis palliative consulted and to discussing with the patient and family choice was made to transition patient's care towards comfort.  He will be discharged to [facility]. Dyspnea -Likely multifactorial in the setting of CHF, aortic stenosis, end-stage renal disease,  currently stable and he is on room air.  PRN morphine Acute biventricular systolic CHF -Compared to TTE 01/24/2017 significant decrease in patient's cardiac function w/ EF 20-25% - not a candidate for further intervention/evaluation  Severe AoS -Cards feels pt is a poor candidate for AoVR or even TAVR - not a candidate for further intervention/evaluation - now comfort CAD / NSTEMI -Not a candidate for further intervention/evaluation  ESRD HD M/W/F -she is preference is to stop HD.  Now focus on comfort Anemia of CKD -Hgb stable  Paroxysmal atrial fibrillation (CHADSVASc5+)  Dementia -Continue Namenda and Depakote - mental status/clarity waxes and wanes - increased depakoteduring this hospital stayto attempt to stabilize mood/combat agitation DM2  HLD  Hypothyroidism -Continue Synthroid     Discharge Diagnoses:  Principal Problem:   Acute on chronic diastolic heart failure (HCC) Active Problems:   Diabetes mellitus with peripheral vascular disease ([place])   CAD (coronary artery disease)   Atrial fibrillation ([place])   Hypothyroidism   ESRD on dialysis [facility])   Palliative care encounter   Chest pain   Supratherapeutic INR   Elevated troponin     Discharge Instructions   Allergies as of 01/02/2018      Reactions   Betadine [povidone Iodine] Rash   Iodinated Diagnostic Agents Rash, Other (See Comments)   Does fine with premedications for cath   Latex Hives   Tape Rash   MUST BE FREE OF ANY LATEX      Medication List    STOP taking these medications   atorvastatin 80 MG tablet Commonly known as:  LIPITOR   CENTRUM SILVER 50+MEN Tabs   doxycycline 100 MG tablet Commonly known as:  VIBRA-TABS   multivitamin Tabs tablet   Vitamin D-3 1000 units Caps   warfarin  5 MG tablet Commonly known as:  COUMADIN     TAKE these medications   acetaminophen 325 MG tablet Commonly known as:  TYLENOL Take 2 tablets (650 mg total) by mouth every 4 (four) hours as needed for  headache or mild pain.   calcium acetate 667 MG capsule Commonly known as:  PHOSLO Take 1,334 mg by mouth 2 (two) times daily with a meal.   divalproex 125 MG capsule Commonly known as:  DEPAKOTE SPRINKLE Take 125 mg by mouth daily.   ipratropium 0.03 % nasal spray Commonly known as:  ATROVENT Place 2 sprays into both nostrils 3 (three) times daily as needed for rhinitis.   LANTUS SOLOSTAR 100 UNIT/ML Solostar Pen Generic drug:  Insulin Glargine Inject 24 Units into the skin daily before breakfast.   levothyroxine 175 MCG tablet Commonly known as:  SYNTHROID, LEVOTHROID Take 175 mcg by mouth daily before breakfast.   LORazepam 2 MG/ML concentrated solution Commonly known as:  LORAZEPAM INTENSOL Take 0.3 mLs (0.6 mg total) by mouth every 4 (four) hours as needed for anxiety.   memantine 5 MG tablet Commonly known as:  NAMENDA Take 5 mg by mouth daily.   morphine CONCENTRATE 10 MG/0.5ML Soln concentrated solution Take 0.25 mLs (5 mg total) by mouth every 2 (two) hours as needed for severe pain or shortness of breath.   nitroGLYCERIN 0.4 MG SL tablet Commonly known as:  NITROSTAT Place 1 tablet (0.4 mg total) under the tongue every 5 (five) minutes as needed for chest pain.   NOVOLOG FLEXPEN 100 UNIT/ML FlexPen Generic drug:  insulin aspart Inject 8 Units into the skin 2 (two) times daily before a meal.   polyethylene glycol packet Commonly known as:  MIRALAX / GLYCOLAX Take 17 g by mouth daily as needed for mild constipation.   ranitidine 300 MG tablet Commonly known as:  ZANTAC Take 300 mg by mouth daily.   triamcinolone cream 0.1 % Commonly known as:  KENALOG Apply 1 application topically 2 (two) times daily as needed (to eczema-affected areas of the skin).        Consultations:  Cardiology   Nephrology   Palliative care  Procedures/Studies:  2D echo  Impressions: - Since the prior study on 01/24/2017 LVEf has decreased from 55-60%to 20-25%. RVEF is  also severely decrased.Aortic stenosis remains severe (gradients in the moderate range,however low LVEF). Moderate pulmonary hypertension.    Dg Chest 2 View  Result Date: 12/26/2017 CLINICAL DATA:  Shortness of breath EXAM: CHEST - 2 VIEW COMPARISON:  01/22/2017 FINDINGS: Post sternotomy changes. Cardiomegaly with mild vascular congestion. There are small pleural effusions. Hazy edema or atelectasis at the bases. Aortic atherosclerosis. No pneumothorax. IMPRESSION: 1. Cardiomegaly with minimal vascular congestion and hazy edema or atelectasis at the bases. Small pleural effusions. Electronically Signed   By: Donavan Foil M.D.   On: 12/26/2017 02:30      Subjective: Appears weak, no specific complaints. Reiterates to me that he is bored of dialysis.  Discharge Exam: Vitals:   01/02/18 0759 01/02/18 1159  BP: (!) 103/48 (!) 115/48  Pulse: (!) 58 (!) 57  Resp: 19 18  Temp: 98.3 F (36.8 C) 98.9 F (37.2 C)  SpO2: 97% 100%    General: Pt is alert, awake, not in acute distress Cardiovascular: RRR, S1/S2 +, no rubs, no gallops Respiratory: CTA bilaterally, no wheezing, no rhonchi    The results of significant diagnostics from this hospitalization (including imaging, microbiology, ancillary and laboratory) are listed below for  reference.     Microbiology: Recent Results (from the past 240 hour(s))  MRSA PCR Screening     Status: None   Collection Time: 12/26/17  5:59 AM  Result Value Ref Range Status   MRSA by PCR NEGATIVE NEGATIVE Final    Comment:        The GeneXpert MRSA Assay (FDA approved for NASAL specimens only), is one component of a comprehensive MRSA colonization surveillance program. It is not intended to diagnose MRSA infection nor to guide or monitor treatment for MRSA infections. Performed at [facility], [place] [address] [place], [place] [address]      Labs: BNP (last 3 results) No results for input(s): BNP in the last 8760 hours. Basic  Metabolic Panel: Recent Labs  Lab 12/27/17 0816 12/28/17 0720 12/29/17 0240 12/30/17 0330 12/31/17 1329  NA 141 137 137 136 135  K 3.9 3.9 4.1 4.0 3.9  CL 97* 97* 96* 93* 95*  CO2 31 30 31 31 29   GLUCOSE 86 115* 109* 133* 159*  BUN 13 24* 11 21 34*  CREATININE 5.08* 6.61* 4.63* 6.74* 8.97*  CALCIUM 8.8* 8.9 9.0 9.5 9.4  MG 2.2  --   --   --   --   PHOS  --  3.4  --   --  3.3   Liver Function Tests: Recent Labs  Lab 12/28/17 0720 12/29/17 0240 12/30/17 0330 12/31/17 1329  AST  --  27 24  --   ALT  --  20 21  --   ALKPHOS  --  64 70  --   BILITOT  --  1.3* 1.2  --   PROT  --  6.2* 6.4*  --   ALBUMIN 3.4* 3.6 3.8 3.4*   No results for input(s): LIPASE, AMYLASE in the last 168 hours. No results for input(s): AMMONIA in the last 168 hours. CBC: Recent Labs  Lab 12/27/17 0816 12/28/17 0720 12/29/17 0240 12/30/17 0330 12/31/17 1329  WBC 4.9 5.8 6.0 5.6 5.6  HGB 13.1 12.5* 13.7 14.5 12.8*  HCT 41.8 39.9 43.6 45.6 40.1  MCV 102.5* 101.3* 101.2* 100.7* 99.5  PLT 104* 127* 117* 137* 130*   Cardiac Enzymes: Recent Labs  Lab 12/26/17 2102 12/27/17 0816 12/27/17 1433 12/27/17 2041  TROPONINI 0.47* 0.43* 0.37* 0.31*   BNP: Invalid input(s): POCBNP CBG: Recent Labs  Lab 01/01/18 1218 01/01/18 1714 01/01/18 2154 01/02/18 0818 01/02/18 1230  GLUCAP 105* 117* 179* 102* 182*   D-Dimer No results for input(s): DDIMER in the last 72 hours. Hgb A1c No results for input(s): HGBA1C in the last 72 hours. Lipid Profile No results for input(s): CHOL, HDL, LDLCALC, TRIG, CHOLHDL, LDLDIRECT in the last 72 hours. Thyroid function studies No results for input(s): TSH, T4TOTAL, T3FREE, THYROIDAB in the last 72 hours.  Invalid input(s): FREET3 Anemia work up No results for input(s): VITAMINB12, FOLATE, FERRITIN, TIBC, IRON, RETICCTPCT in the last 72 hours. Urinalysis    Component Value Date/Time   COLORURINE YELLOW 09/17/2015 1815   APPEARANCEUR CLOUDY (A)  09/17/2015 1815   LABSPEC 1.010 09/17/2015 1815   PHURINE 5.0 09/17/2015 1815   GLUCOSEU NEGATIVE 09/17/2015 1815   HGBUR SMALL (A) 09/17/2015 1815   BILIRUBINUR NEGATIVE 09/17/2015 1815   KETONESUR NEGATIVE 09/17/2015 1815   PROTEINUR NEGATIVE 09/17/2015 1815   UROBILINOGEN 1.0 03/23/2014 2156   NITRITE NEGATIVE 09/17/2015 1815   LEUKOCYTESUR LARGE (A) 09/17/2015 1815   Sepsis Labs Invalid input(s): PROCALCITONIN,  WBC,  LACTICIDVEN  Time coordinating discharge: 40 minutes  SIGNED:  Marzetta Board, MD  [facility] 01/02/2018, 2:58 PM Pager [phone]  If 7PM-7AM, please contact night-coverage [email or web address] Password [email or web address]

## 2018-01-02 NOTE — Progress Notes (Signed)
Palliative:  I met today with [name] along with his wife, son [name] and wife, daughter, and grandson at bedside as planned. We discussed and confirmed the plan to d/c dialysis at this point and to pursue hospice facility. We discussed prognosis (days to weeks) and symptoms expected with EOL from renal failure in combination with heart disease. Discussed in detail the benefits of hospice facility. Discussed specifically with [name] again as his memory is poor and he does again confirms his desire for NO further dialysis and agrees with hospice placement with a little reassurance and encouragement. We spent time explaining to him the difference in hospice facility vs nursing facility and the quality of care. All questions/concerns addressed. Emotional support provided. Care coordinated with Dr. Renne Crigler, CSW/CMRN, [facility] liaison for transition of care.   Exam: No distress. Denies SOB and "feeling good" today. Alert and mostly oriented with impaired memory.    26 min  Vinie Sill, NP Palliative Medicine Team Pager # [phone] (M-F 8a-5p) Team Phone # [phone] (Nights/Weekends)

## 2018-01-08 ENCOUNTER — Telehealth: Payer: Self-pay | Admitting: *Deleted

## 2018-01-08 NOTE — Telephone Encounter (Signed)
Wife called and reported Anticoagulation Therapy has been discontinued and that the pt has been placed on Hospice at [facility]. Discontinued Anticoagulation Tack and expressed sympathy to the wife during this time and advised he to callback if she needed anything.

## 2018-03-14 ENCOUNTER — Encounter: Payer: Self-pay | Admitting: Cardiovascular Disease

## 2018-03-26 ENCOUNTER — Ambulatory Visit: Payer: Medicare Other | Admitting: Cardiovascular Disease

## 2018-03-26 DEATH — deceased
